# Patient Record
Sex: Female | Born: 1970 | Race: White | Hispanic: No | Smoking: Former smoker
Health system: Southern US, Community
[De-identification: ages and names within clinical notes are randomized; demographics above are authoritative.]

## PROBLEM LIST (undated history)

## (undated) DIAGNOSIS — Z5181 Encounter for therapeutic drug level monitoring: Secondary | ICD-10-CM

## (undated) DIAGNOSIS — E039 Hypothyroidism, unspecified: Secondary | ICD-10-CM

## (undated) DIAGNOSIS — M329 Systemic lupus erythematosus, unspecified: Secondary | ICD-10-CM

## (undated) DIAGNOSIS — E538 Deficiency of other specified B group vitamins: Secondary | ICD-10-CM

## (undated) DIAGNOSIS — G629 Polyneuropathy, unspecified: Secondary | ICD-10-CM

## (undated) DIAGNOSIS — K219 Gastro-esophageal reflux disease without esophagitis: Secondary | ICD-10-CM

## (undated) DIAGNOSIS — F419 Anxiety disorder, unspecified: Secondary | ICD-10-CM

## (undated) DIAGNOSIS — M545 Low back pain, unspecified: Secondary | ICD-10-CM

## (undated) DIAGNOSIS — R8789 Other abnormal findings in specimens from female genital organs: Principal | ICD-10-CM

## (undated) DIAGNOSIS — Z9289 Personal history of other medical treatment: Secondary | ICD-10-CM

## (undated) DIAGNOSIS — D591 Autoimmune hemolytic anemia, unspecified: Secondary | ICD-10-CM

## (undated) DIAGNOSIS — Z7901 Long term (current) use of anticoagulants: Secondary | ICD-10-CM

## (undated) DIAGNOSIS — Z8742 Personal history of other diseases of the female genital tract: Secondary | ICD-10-CM

## (undated) DIAGNOSIS — T50905A Adverse effect of unspecified drugs, medicaments and biological substances, initial encounter: Secondary | ICD-10-CM

## (undated) DIAGNOSIS — R009 Unspecified abnormalities of heart beat: Secondary | ICD-10-CM

## (undated) DIAGNOSIS — R87613 High grade squamous intraepithelial lesion on cytologic smear of cervix (HGSIL): Principal | ICD-10-CM

## (undated) DIAGNOSIS — M797 Fibromyalgia: Secondary | ICD-10-CM

## (undated) DIAGNOSIS — M069 Rheumatoid arthritis, unspecified: Secondary | ICD-10-CM

## (undated) DIAGNOSIS — K716 Toxic liver disease with hepatitis, not elsewhere classified: Secondary | ICD-10-CM

## (undated) DIAGNOSIS — I7300 Raynaud's syndrome without gangrene: Secondary | ICD-10-CM

## (undated) DIAGNOSIS — M7051 Other bursitis of knee, right knee: Secondary | ICD-10-CM

## (undated) DIAGNOSIS — Z1371 Encounter for nonprocreative screening for genetic disease carrier status: Secondary | ICD-10-CM

## (undated) DIAGNOSIS — I059 Rheumatic mitral valve disease, unspecified: Secondary | ICD-10-CM

## (undated) DIAGNOSIS — G8929 Other chronic pain: Secondary | ICD-10-CM

## (undated) DIAGNOSIS — C50912 Malignant neoplasm of unspecified site of left female breast: Secondary | ICD-10-CM

## (undated) DIAGNOSIS — J189 Pneumonia, unspecified organism: Secondary | ICD-10-CM

## (undated) HISTORY — DX: Other bursitis of knee, right knee: M70.51

## (undated) HISTORY — DX: Polyneuropathy, unspecified: G62.9

## (undated) HISTORY — DX: Systemic lupus erythematosus, unspecified: M32.9

## (undated) HISTORY — DX: Personal history of other diseases of the female genital tract: Z87.42

## (undated) HISTORY — DX: Long term (current) use of anticoagulants: Z79.01

## (undated) HISTORY — DX: Encounter for nonprocreative screening for genetic disease carrier status: Z13.71

## (undated) HISTORY — DX: Rheumatic mitral valve disease, unspecified: I05.9

## (undated) HISTORY — DX: Other abnormal findings in specimens from female genital organs: R87.89

## (undated) HISTORY — DX: Unspecified abnormalities of heart beat: R00.9

## (undated) HISTORY — DX: Autoimmune hemolytic anemia, unspecified: D59.10

## (undated) HISTORY — PX: CARDIAC VALVE REPLACEMENT: SHX585

## (undated) HISTORY — DX: High grade squamous intraepithelial lesion on cytologic smear of cervix (HGSIL): R87.613

## (undated) HISTORY — PX: LAPAROSCOPIC CHOLECYSTECTOMY: SUR755

## (undated) HISTORY — DX: Deficiency of other specified B group vitamins: E53.8

## (undated) HISTORY — DX: Pneumonia, unspecified organism: J18.9

## (undated) HISTORY — DX: Other autoimmune hemolytic anemias: D59.1

## (undated) HISTORY — PX: APPENDECTOMY: SHX54

## (undated) HISTORY — PX: CORNEAL TRANSPLANT: SHX108

## (undated) HISTORY — DX: Adverse effect of unspecified drugs, medicaments and biological substances, initial encounter: T50.905A

## (undated) HISTORY — DX: Toxic liver disease with hepatitis, not elsewhere classified: K71.6

## (undated) HISTORY — DX: Encounter for therapeutic drug level monitoring: Z51.81

## (undated) HISTORY — PX: TUBAL LIGATION: SHX77

## (undated) SURGERY — APPENDECTOMY, LAPAROSCOPIC
Anesthesia: General

---

## 2001-03-28 ENCOUNTER — Encounter: Payer: Self-pay | Admitting: Emergency Medicine

## 2001-03-28 ENCOUNTER — Emergency Department (HOSPITAL_COMMUNITY): Admission: EM | Admit: 2001-03-28 | Discharge: 2001-03-29 | Payer: Self-pay | Admitting: Emergency Medicine

## 2001-05-07 ENCOUNTER — Encounter: Payer: Self-pay | Admitting: Family Medicine

## 2001-05-07 ENCOUNTER — Ambulatory Visit (HOSPITAL_COMMUNITY): Admission: RE | Admit: 2001-05-07 | Discharge: 2001-05-07 | Payer: Self-pay | Admitting: Family Medicine

## 2001-05-09 ENCOUNTER — Ambulatory Visit (HOSPITAL_COMMUNITY): Admission: RE | Admit: 2001-05-09 | Discharge: 2001-05-09 | Payer: Self-pay | Admitting: Family Medicine

## 2001-05-09 ENCOUNTER — Encounter: Payer: Self-pay | Admitting: Family Medicine

## 2001-05-12 ENCOUNTER — Ambulatory Visit (HOSPITAL_COMMUNITY): Admission: RE | Admit: 2001-05-12 | Discharge: 2001-05-12 | Payer: Self-pay | Admitting: Family Medicine

## 2001-05-12 ENCOUNTER — Encounter: Payer: Self-pay | Admitting: Family Medicine

## 2001-05-13 ENCOUNTER — Emergency Department (HOSPITAL_COMMUNITY): Admission: EM | Admit: 2001-05-13 | Discharge: 2001-05-14 | Payer: Self-pay

## 2005-02-20 ENCOUNTER — Ambulatory Visit (HOSPITAL_COMMUNITY): Admission: RE | Admit: 2005-02-20 | Discharge: 2005-02-20 | Payer: Self-pay | Admitting: Family Medicine

## 2006-02-19 HISTORY — PX: MITRAL VALVE REPLACEMENT: SHX147

## 2006-02-19 HISTORY — PX: ENDOMETRIAL ABLATION: SHX621

## 2006-02-19 HISTORY — PX: CARDIAC CATHETERIZATION: SHX172

## 2006-03-20 ENCOUNTER — Ambulatory Visit: Payer: Self-pay | Admitting: Cardiology

## 2006-03-26 ENCOUNTER — Ambulatory Visit: Payer: Self-pay | Admitting: Cardiovascular Disease

## 2006-03-26 ENCOUNTER — Encounter: Payer: Self-pay | Admitting: Cardiology

## 2006-03-26 ENCOUNTER — Inpatient Hospital Stay (HOSPITAL_BASED_OUTPATIENT_CLINIC_OR_DEPARTMENT_OTHER): Admission: RE | Admit: 2006-03-26 | Discharge: 2006-03-26 | Payer: Self-pay | Admitting: Cardiovascular Disease

## 2006-03-28 ENCOUNTER — Ambulatory Visit: Payer: Self-pay | Admitting: Cardiology

## 2006-06-13 ENCOUNTER — Ambulatory Visit: Payer: Self-pay | Admitting: Cardiology

## 2006-09-11 ENCOUNTER — Ambulatory Visit: Payer: Self-pay | Admitting: Cardiology

## 2006-09-19 ENCOUNTER — Ambulatory Visit: Payer: Self-pay | Admitting: Cardiology

## 2007-05-13 ENCOUNTER — Ambulatory Visit: Payer: Self-pay | Admitting: Cardiology

## 2007-05-21 ENCOUNTER — Encounter: Payer: Self-pay | Admitting: Obstetrics & Gynecology

## 2007-05-21 ENCOUNTER — Ambulatory Visit (HOSPITAL_COMMUNITY): Admission: RE | Admit: 2007-05-21 | Discharge: 2007-05-21 | Payer: Self-pay | Admitting: Obstetrics & Gynecology

## 2007-06-05 ENCOUNTER — Encounter: Payer: Self-pay | Admitting: Cardiology

## 2007-06-07 ENCOUNTER — Emergency Department (HOSPITAL_COMMUNITY): Admission: EM | Admit: 2007-06-07 | Discharge: 2007-06-07 | Payer: Self-pay | Admitting: Emergency Medicine

## 2007-06-19 ENCOUNTER — Ambulatory Visit: Payer: Self-pay | Admitting: Cardiology

## 2007-06-25 ENCOUNTER — Ambulatory Visit: Payer: Self-pay | Admitting: Cardiology

## 2007-07-21 ENCOUNTER — Ambulatory Visit (HOSPITAL_COMMUNITY): Payer: Self-pay | Admitting: Oncology

## 2007-07-21 ENCOUNTER — Encounter (HOSPITAL_COMMUNITY): Admission: RE | Admit: 2007-07-21 | Discharge: 2007-08-20 | Payer: Self-pay | Admitting: Oncology

## 2007-10-03 ENCOUNTER — Encounter (HOSPITAL_COMMUNITY): Admission: RE | Admit: 2007-10-03 | Discharge: 2007-11-02 | Payer: Self-pay | Admitting: Oncology

## 2007-10-03 ENCOUNTER — Ambulatory Visit (HOSPITAL_COMMUNITY): Payer: Self-pay | Admitting: Oncology

## 2007-10-15 ENCOUNTER — Ambulatory Visit: Payer: Self-pay | Admitting: Cardiology

## 2007-11-04 ENCOUNTER — Encounter (HOSPITAL_COMMUNITY): Admission: RE | Admit: 2007-11-04 | Discharge: 2007-11-17 | Payer: Self-pay | Admitting: Oncology

## 2007-11-05 ENCOUNTER — Ambulatory Visit (HOSPITAL_COMMUNITY): Admission: RE | Admit: 2007-11-05 | Discharge: 2007-11-05 | Payer: Self-pay | Admitting: Family Medicine

## 2007-11-18 ENCOUNTER — Ambulatory Visit (HOSPITAL_COMMUNITY): Admission: RE | Admit: 2007-11-18 | Discharge: 2007-11-18 | Payer: Self-pay | Admitting: Oncology

## 2007-11-23 ENCOUNTER — Emergency Department (HOSPITAL_COMMUNITY): Admission: EM | Admit: 2007-11-23 | Discharge: 2007-11-23 | Payer: Self-pay | Admitting: Emergency Medicine

## 2007-11-28 ENCOUNTER — Encounter (HOSPITAL_COMMUNITY): Admission: RE | Admit: 2007-11-28 | Discharge: 2007-12-28 | Payer: Self-pay | Admitting: Oncology

## 2007-12-15 ENCOUNTER — Ambulatory Visit (HOSPITAL_COMMUNITY): Payer: Self-pay | Admitting: Oncology

## 2007-12-30 ENCOUNTER — Encounter (HOSPITAL_COMMUNITY): Admission: RE | Admit: 2007-12-30 | Discharge: 2008-01-29 | Payer: Self-pay | Admitting: Oncology

## 2008-02-03 ENCOUNTER — Encounter (HOSPITAL_COMMUNITY): Admission: RE | Admit: 2008-02-03 | Discharge: 2008-03-04 | Payer: Self-pay | Admitting: Oncology

## 2008-02-16 ENCOUNTER — Ambulatory Visit (HOSPITAL_COMMUNITY): Payer: Self-pay | Admitting: Oncology

## 2008-03-08 ENCOUNTER — Encounter (HOSPITAL_COMMUNITY): Admission: RE | Admit: 2008-03-08 | Discharge: 2008-04-07 | Payer: Self-pay | Admitting: Oncology

## 2008-04-19 ENCOUNTER — Ambulatory Visit (HOSPITAL_COMMUNITY): Payer: Self-pay | Admitting: Oncology

## 2008-04-19 ENCOUNTER — Encounter (HOSPITAL_COMMUNITY): Admission: RE | Admit: 2008-04-19 | Discharge: 2008-05-19 | Payer: Self-pay | Admitting: Oncology

## 2008-05-31 ENCOUNTER — Encounter: Payer: Self-pay | Admitting: Cardiology

## 2008-05-31 ENCOUNTER — Encounter (HOSPITAL_COMMUNITY): Admission: RE | Admit: 2008-05-31 | Discharge: 2008-06-30 | Payer: Self-pay | Admitting: Oncology

## 2008-06-07 ENCOUNTER — Ambulatory Visit: Payer: Self-pay | Admitting: Cardiology

## 2008-07-12 ENCOUNTER — Ambulatory Visit (HOSPITAL_COMMUNITY): Payer: Self-pay | Admitting: Oncology

## 2008-07-12 ENCOUNTER — Encounter (HOSPITAL_COMMUNITY): Admission: RE | Admit: 2008-07-12 | Discharge: 2008-08-11 | Payer: Self-pay | Admitting: Oncology

## 2008-10-05 ENCOUNTER — Ambulatory Visit (HOSPITAL_COMMUNITY): Payer: Self-pay | Admitting: Oncology

## 2008-10-05 ENCOUNTER — Encounter (HOSPITAL_COMMUNITY): Admission: RE | Admit: 2008-10-05 | Discharge: 2008-11-04 | Payer: Self-pay | Admitting: Oncology

## 2008-10-31 ENCOUNTER — Emergency Department (HOSPITAL_COMMUNITY): Admission: EM | Admit: 2008-10-31 | Discharge: 2008-10-31 | Payer: Self-pay | Admitting: Emergency Medicine

## 2008-12-28 ENCOUNTER — Ambulatory Visit (HOSPITAL_COMMUNITY): Payer: Self-pay | Admitting: Oncology

## 2008-12-28 ENCOUNTER — Encounter (HOSPITAL_COMMUNITY): Admission: RE | Admit: 2008-12-28 | Discharge: 2009-01-27 | Payer: Self-pay | Admitting: Oncology

## 2009-02-19 DIAGNOSIS — C50912 Malignant neoplasm of unspecified site of left female breast: Secondary | ICD-10-CM

## 2009-02-19 HISTORY — DX: Malignant neoplasm of unspecified site of left female breast: C50.912

## 2009-05-02 ENCOUNTER — Ambulatory Visit (HOSPITAL_COMMUNITY): Payer: Self-pay | Admitting: Oncology

## 2009-05-02 ENCOUNTER — Encounter (HOSPITAL_COMMUNITY): Admission: RE | Admit: 2009-05-02 | Discharge: 2009-06-01 | Payer: Self-pay | Admitting: Oncology

## 2009-09-02 ENCOUNTER — Ambulatory Visit (HOSPITAL_COMMUNITY): Payer: Self-pay | Admitting: Oncology

## 2009-09-02 ENCOUNTER — Encounter (HOSPITAL_COMMUNITY): Admission: RE | Admit: 2009-09-02 | Discharge: 2009-10-02 | Payer: Self-pay | Admitting: Oncology

## 2009-11-10 ENCOUNTER — Ambulatory Visit: Payer: Self-pay | Admitting: Cardiology

## 2009-11-10 DIAGNOSIS — M329 Systemic lupus erythematosus, unspecified: Secondary | ICD-10-CM | POA: Insufficient documentation

## 2009-11-10 DIAGNOSIS — Z952 Presence of prosthetic heart valve: Secondary | ICD-10-CM | POA: Insufficient documentation

## 2009-12-02 ENCOUNTER — Encounter (HOSPITAL_COMMUNITY)
Admission: RE | Admit: 2009-12-02 | Discharge: 2010-01-01 | Payer: Self-pay | Source: Home / Self Care | Admitting: Oncology

## 2009-12-02 ENCOUNTER — Ambulatory Visit (HOSPITAL_COMMUNITY): Payer: Self-pay | Admitting: Oncology

## 2009-12-28 ENCOUNTER — Ambulatory Visit (HOSPITAL_COMMUNITY)
Admission: RE | Admit: 2009-12-28 | Discharge: 2009-12-28 | Payer: Self-pay | Source: Home / Self Care | Admitting: Oncology

## 2009-12-30 ENCOUNTER — Telehealth (INDEPENDENT_AMBULATORY_CARE_PROVIDER_SITE_OTHER): Payer: Self-pay | Admitting: *Deleted

## 2010-01-04 ENCOUNTER — Encounter (HOSPITAL_COMMUNITY)
Admission: RE | Admit: 2010-01-04 | Discharge: 2010-02-03 | Payer: Self-pay | Source: Home / Self Care | Attending: Oncology | Admitting: Oncology

## 2010-01-06 ENCOUNTER — Encounter: Admission: RE | Admit: 2010-01-06 | Discharge: 2010-01-06 | Payer: Self-pay | Admitting: Oncology

## 2010-01-06 ENCOUNTER — Ambulatory Visit: Payer: Self-pay | Admitting: Genetic Counselor

## 2010-01-17 ENCOUNTER — Telehealth (INDEPENDENT_AMBULATORY_CARE_PROVIDER_SITE_OTHER): Payer: Self-pay | Admitting: *Deleted

## 2010-01-23 ENCOUNTER — Ambulatory Visit: Payer: Self-pay | Admitting: Cardiovascular Disease

## 2010-01-23 ENCOUNTER — Encounter (HOSPITAL_COMMUNITY): Payer: Self-pay | Admitting: Oncology

## 2010-01-23 ENCOUNTER — Encounter (INDEPENDENT_AMBULATORY_CARE_PROVIDER_SITE_OTHER): Payer: Self-pay | Admitting: *Deleted

## 2010-01-24 ENCOUNTER — Ambulatory Visit: Payer: Self-pay | Admitting: Cardiology

## 2010-01-24 LAB — CONVERTED CEMR LAB: POC INR: 2.9

## 2010-01-25 ENCOUNTER — Other Ambulatory Visit
Admission: RE | Admit: 2010-01-25 | Discharge: 2010-01-25 | Payer: Self-pay | Source: Home / Self Care | Admitting: Obstetrics and Gynecology

## 2010-01-26 ENCOUNTER — Ambulatory Visit (HOSPITAL_COMMUNITY): Payer: Self-pay | Admitting: Oncology

## 2010-01-30 ENCOUNTER — Ambulatory Visit (HOSPITAL_COMMUNITY)
Admission: RE | Admit: 2010-01-30 | Discharge: 2010-01-30 | Payer: Self-pay | Source: Home / Self Care | Attending: Surgery | Admitting: Surgery

## 2010-01-31 ENCOUNTER — Ambulatory Visit (HOSPITAL_COMMUNITY): Payer: Self-pay | Admitting: Oncology

## 2010-02-03 ENCOUNTER — Ambulatory Visit: Payer: Self-pay | Admitting: Cardiology

## 2010-02-03 ENCOUNTER — Encounter: Payer: Self-pay | Admitting: Cardiology

## 2010-02-07 ENCOUNTER — Ambulatory Visit: Payer: Self-pay | Admitting: Cardiology

## 2010-02-07 ENCOUNTER — Inpatient Hospital Stay (HOSPITAL_COMMUNITY)
Admission: EM | Admit: 2010-02-07 | Discharge: 2010-02-21 | Disposition: A | Discharge status: Other Acute Inpatient Hospital | Payer: Self-pay | Source: Home / Self Care

## 2010-02-10 ENCOUNTER — Encounter (INDEPENDENT_AMBULATORY_CARE_PROVIDER_SITE_OTHER): Payer: Self-pay | Admitting: Internal Medicine

## 2010-02-10 ENCOUNTER — Encounter: Payer: Self-pay | Admitting: Physician Assistant

## 2010-02-10 ENCOUNTER — Encounter: Payer: Self-pay | Admitting: Cardiology

## 2010-02-11 ENCOUNTER — Encounter (INDEPENDENT_AMBULATORY_CARE_PROVIDER_SITE_OTHER): Payer: Self-pay | Admitting: *Deleted

## 2010-02-11 ENCOUNTER — Inpatient Hospital Stay (HOSPITAL_COMMUNITY)
Admission: EM | Admit: 2010-02-11 | Discharge: 2010-02-21 | Payer: Self-pay | Attending: Internal Medicine | Admitting: Internal Medicine

## 2010-02-15 ENCOUNTER — Ambulatory Visit: Payer: Self-pay

## 2010-02-15 ENCOUNTER — Encounter (INDEPENDENT_AMBULATORY_CARE_PROVIDER_SITE_OTHER): Payer: Self-pay | Admitting: *Deleted

## 2010-02-17 ENCOUNTER — Encounter (INDEPENDENT_AMBULATORY_CARE_PROVIDER_SITE_OTHER): Payer: Self-pay | Admitting: Internal Medicine

## 2010-02-17 ENCOUNTER — Encounter (INDEPENDENT_AMBULATORY_CARE_PROVIDER_SITE_OTHER): Payer: Self-pay | Admitting: *Deleted

## 2010-02-19 HISTORY — PX: BREAST BIOPSY: SHX20

## 2010-02-19 HISTORY — PX: MASTECTOMY: SHX3

## 2010-02-20 ENCOUNTER — Encounter: Payer: Self-pay | Admitting: Physician Assistant

## 2010-02-21 ENCOUNTER — Encounter (INDEPENDENT_AMBULATORY_CARE_PROVIDER_SITE_OTHER): Payer: Self-pay | Admitting: *Deleted

## 2010-02-21 ENCOUNTER — Encounter: Payer: Self-pay | Admitting: Physician Assistant

## 2010-02-22 ENCOUNTER — Encounter (HOSPITAL_COMMUNITY)
Admission: RE | Admit: 2010-02-22 | Discharge: 2010-03-21 | Payer: Self-pay | Source: Home / Self Care | Attending: Oncology | Admitting: Oncology

## 2010-02-22 ENCOUNTER — Telehealth (INDEPENDENT_AMBULATORY_CARE_PROVIDER_SITE_OTHER): Payer: Self-pay | Admitting: *Deleted

## 2010-02-24 ENCOUNTER — Ambulatory Visit
Admission: RE | Admit: 2010-02-24 | Discharge: 2010-02-24 | Payer: Self-pay | Source: Home / Self Care | Attending: Physician Assistant | Admitting: Physician Assistant

## 2010-02-24 ENCOUNTER — Encounter: Payer: Self-pay | Admitting: Physician Assistant

## 2010-02-24 ENCOUNTER — Ambulatory Visit: Admission: RE | Admit: 2010-02-24 | Discharge: 2010-02-24 | Payer: Self-pay | Source: Home / Self Care

## 2010-02-24 DIAGNOSIS — Z9089 Acquired absence of other organs: Secondary | ICD-10-CM | POA: Insufficient documentation

## 2010-02-24 DIAGNOSIS — R5081 Fever presenting with conditions classified elsewhere: Secondary | ICD-10-CM | POA: Insufficient documentation

## 2010-02-24 DIAGNOSIS — C50419 Malignant neoplasm of upper-outer quadrant of unspecified female breast: Secondary | ICD-10-CM | POA: Insufficient documentation

## 2010-03-06 LAB — COMPREHENSIVE METABOLIC PANEL
ALT: 13 U/L (ref 0–35)
AST: 19 U/L (ref 0–37)
Albumin: 3.2 g/dL — ABNORMAL LOW (ref 3.5–5.2)
Alkaline Phosphatase: 60 U/L (ref 39–117)
BUN: 11 mg/dL (ref 6–23)
CO2: 25 mEq/L (ref 19–32)
Calcium: 8.8 mg/dL (ref 8.4–10.5)
Chloride: 106 mEq/L (ref 96–112)
Creatinine, Ser: 0.7 mg/dL (ref 0.4–1.2)
GFR calc Af Amer: >60 mL/min (ref >60)
GFR calc non Af Amer: >60 mL/min (ref >60)
Glucose, Bld: 109 mg/dL — ABNORMAL HIGH (ref 70–99)
Potassium: 3.2 mEq/L — ABNORMAL LOW (ref 3.5–5.1)
Sodium: 139 mEq/L (ref 135–145)
Total Bilirubin: 0.4 mg/dL (ref 0.3–1.2)
Total Protein: 6.7 g/dL (ref 6.0–8.3)

## 2010-03-06 LAB — DIFFERENTIAL
Basophils Absolute: 0.1 10*3/uL (ref 0.0–0.1)
Basophils Relative: 1 % (ref 0–1)
Eosinophils Absolute: 0.5 10*3/uL (ref 0.0–0.7)
Eosinophils Relative: 9 % — ABNORMAL HIGH (ref 0–5)
Lymphocytes Relative: 6 % — ABNORMAL LOW (ref 12–46)
Lymphs Abs: 0.4 10*3/uL — ABNORMAL LOW (ref 0.7–4.0)
Monocytes Absolute: 0.3 10*3/uL (ref 0.1–1.0)
Monocytes Relative: 6 % (ref 3–12)
Neutro Abs: 4.4 10*3/uL (ref 1.7–7.7)
Neutrophils Relative %: 78 % — ABNORMAL HIGH (ref 43–77)

## 2010-03-06 LAB — CBC
HCT: 28 % — ABNORMAL LOW (ref 36.0–46.0)
Hemoglobin: 9.3 g/dL — ABNORMAL LOW (ref 12.0–15.0)
MCH: 31.4 pg (ref 26.0–34.0)
MCHC: 33.2 g/dL (ref 30.0–36.0)
MCV: 94.6 fL (ref 78.0–100.0)
Platelets: 231 10*3/uL (ref 150–400)
RBC: 2.96 MIL/uL — ABNORMAL LOW (ref 3.87–5.11)
RDW: 16.2 % — ABNORMAL HIGH (ref 11.5–15.5)
WBC: 5.6 10*3/uL (ref 4.0–10.5)

## 2010-03-07 ENCOUNTER — Ambulatory Visit
Admission: RE | Admit: 2010-03-07 | Discharge: 2010-03-07 | Payer: Self-pay | Source: Home / Self Care | Attending: Cardiology | Admitting: Cardiology

## 2010-03-07 LAB — CONVERTED CEMR LAB: POC INR: 1.1

## 2010-03-10 ENCOUNTER — Inpatient Hospital Stay (HOSPITAL_COMMUNITY)
Admission: AD | Admit: 2010-03-10 | Discharge: 2010-04-01 | DRG: 314 | Disposition: A | Discharge status: Home Health | Payer: PRIVATE HEALTH INSURANCE | Attending: Internal Medicine | Admitting: Internal Medicine

## 2010-03-10 ENCOUNTER — Encounter: Payer: Self-pay | Admitting: Cardiology

## 2010-03-10 DIAGNOSIS — Y849 Medical procedure, unspecified as the cause of abnormal reaction of the patient, or of later complication, without mention of misadventure at the time of the procedure: Secondary | ICD-10-CM | POA: Diagnosis present

## 2010-03-10 DIAGNOSIS — A419 Sepsis, unspecified organism: Secondary | ICD-10-CM

## 2010-03-10 DIAGNOSIS — R112 Nausea with vomiting, unspecified: Secondary | ICD-10-CM | POA: Diagnosis present

## 2010-03-10 DIAGNOSIS — B373 Candidiasis of vulva and vagina: Secondary | ICD-10-CM | POA: Diagnosis not present

## 2010-03-10 DIAGNOSIS — Z954 Presence of other heart-valve replacement: Secondary | ICD-10-CM

## 2010-03-10 DIAGNOSIS — T80212A Local infection due to central venous catheter, initial encounter: Principal | ICD-10-CM | POA: Diagnosis present

## 2010-03-10 DIAGNOSIS — R791 Abnormal coagulation profile: Secondary | ICD-10-CM | POA: Diagnosis present

## 2010-03-10 DIAGNOSIS — T451X5A Adverse effect of antineoplastic and immunosuppressive drugs, initial encounter: Secondary | ICD-10-CM | POA: Diagnosis present

## 2010-03-10 DIAGNOSIS — E872 Acidosis, unspecified: Secondary | ICD-10-CM | POA: Diagnosis present

## 2010-03-10 DIAGNOSIS — D61810 Antineoplastic chemotherapy induced pancytopenia: Secondary | ICD-10-CM | POA: Diagnosis present

## 2010-03-10 DIAGNOSIS — T45515A Adverse effect of anticoagulants, initial encounter: Secondary | ICD-10-CM | POA: Diagnosis present

## 2010-03-10 DIAGNOSIS — E039 Hypothyroidism, unspecified: Secondary | ICD-10-CM | POA: Diagnosis present

## 2010-03-10 DIAGNOSIS — C50919 Malignant neoplasm of unspecified site of unspecified female breast: Secondary | ICD-10-CM | POA: Diagnosis present

## 2010-03-10 DIAGNOSIS — M329 Systemic lupus erythematosus, unspecified: Secondary | ICD-10-CM | POA: Diagnosis present

## 2010-03-10 DIAGNOSIS — E871 Hypo-osmolality and hyponatremia: Secondary | ICD-10-CM | POA: Diagnosis not present

## 2010-03-10 DIAGNOSIS — D599 Acquired hemolytic anemia, unspecified: Secondary | ICD-10-CM | POA: Diagnosis present

## 2010-03-10 DIAGNOSIS — R609 Edema, unspecified: Secondary | ICD-10-CM | POA: Diagnosis present

## 2010-03-10 DIAGNOSIS — R0902 Hypoxemia: Secondary | ICD-10-CM | POA: Diagnosis present

## 2010-03-10 DIAGNOSIS — A0472 Enterocolitis due to Clostridium difficile, not specified as recurrent: Secondary | ICD-10-CM | POA: Diagnosis present

## 2010-03-10 DIAGNOSIS — B3731 Acute candidiasis of vulva and vagina: Secondary | ICD-10-CM | POA: Diagnosis not present

## 2010-03-10 DIAGNOSIS — E876 Hypokalemia: Secondary | ICD-10-CM | POA: Diagnosis present

## 2010-03-13 ENCOUNTER — Ambulatory Visit: Admit: 2010-03-13 | Payer: Self-pay

## 2010-03-13 ENCOUNTER — Encounter (INDEPENDENT_AMBULATORY_CARE_PROVIDER_SITE_OTHER): Payer: Self-pay | Admitting: *Deleted

## 2010-03-13 LAB — LACTIC ACID, PLASMA: Lactic Acid, Venous: 1.2 mmol/L (ref 0.5–2.2)

## 2010-03-13 LAB — CBC
HCT: 26.5 % — ABNORMAL LOW (ref 36.0–46.0)
Hemoglobin: 10.3 g/dL — ABNORMAL LOW (ref 12.0–15.0)
MCH: 32.3 pg (ref 26.0–34.0)
MCHC: 35.8 g/dL (ref 30.0–36.0)
MCV: 90.5 fL (ref 78.0–100.0)
MCV: 90.1 fL (ref 78.0–100.0)
Platelets: 96 10*3/uL — ABNORMAL LOW (ref 150–400)
Platelets: 82 10*3/uL — ABNORMAL LOW (ref 150–400)
RBC: 3.28 MIL/uL — ABNORMAL LOW (ref 3.87–5.11)
RDW: 14.5 % (ref 11.5–15.5)
WBC: 0.5 10*3/uL — CL (ref 4.0–10.5)
WBC: 0.4 10*3/uL — CL (ref 4.0–10.5)

## 2010-03-13 LAB — RETICULOCYTES: Retic Ct Pct: 0.1 % — ABNORMAL LOW (ref 0.4–3.1)

## 2010-03-13 LAB — TSH: TSH: 2.798 u[IU]/mL (ref 0.350–4.500)

## 2010-03-13 LAB — URINALYSIS, ROUTINE W REFLEX MICROSCOPIC
Bilirubin Urine: NEGATIVE
Nitrite: NEGATIVE
pH: 5 (ref 5.0–8.0)

## 2010-03-13 LAB — DIFFERENTIAL
Lymphocytes Relative: 29 % (ref 12–46)
Lymphs Abs: 0.1 10*3/uL — ABNORMAL LOW (ref 0.7–4.0)
Monocytes Relative: 0 % — ABNORMAL LOW (ref 3–12)
Neutro Abs: 0.1 10*3/uL — ABNORMAL LOW (ref 1.7–7.7)
Neutrophils Relative %: 23 % — ABNORMAL LOW (ref 43–77)

## 2010-03-13 LAB — COMPREHENSIVE METABOLIC PANEL
Albumin: 3.7 g/dL (ref 3.5–5.2)
Alkaline Phosphatase: 79 U/L (ref 39–117)
Creatinine, Ser: 0.6 mg/dL (ref 0.4–1.2)
Glucose, Bld: 81 mg/dL (ref 70–99)
Potassium: 3.7 mEq/L (ref 3.5–5.1)
Sodium: 136 mEq/L (ref 135–145)
Total Bilirubin: 0.6 mg/dL (ref 0.3–1.2)

## 2010-03-13 LAB — PROTIME-INR
INR: 1.3 (ref 0.00–1.49)
Prothrombin Time: 16.4 seconds — ABNORMAL HIGH (ref 11.6–15.2)

## 2010-03-13 LAB — PHOSPHORUS: Phosphorus: 3.2 mg/dL (ref 2.3–4.6)

## 2010-03-13 LAB — MAGNESIUM: Magnesium: 1.8 mg/dL (ref 1.5–2.5)

## 2010-03-13 LAB — HEPARIN LEVEL (UNFRACTIONATED): Heparin Unfractionated: 0.18 IU/mL — ABNORMAL LOW (ref 0.30–0.70)

## 2010-03-13 LAB — URINE MICROSCOPIC-ADD ON

## 2010-03-13 LAB — VITAMIN B12: Vitamin B-12: >2000 pg/mL — ABNORMAL HIGH (ref 211–911)

## 2010-03-13 LAB — PROCALCITONIN: Procalcitonin: <0.1 ng/mL

## 2010-03-13 LAB — HAPTOGLOBIN: Haptoglobin: <6 mg/dL — ABNORMAL LOW (ref 16–200)

## 2010-03-14 LAB — COMPREHENSIVE METABOLIC PANEL
ALT: 26 U/L (ref 0–35)
Alkaline Phosphatase: 77 U/L (ref 39–117)
BUN: 11 mg/dL (ref 6–23)
CO2: 20 mEq/L (ref 19–32)
Chloride: 107 mEq/L (ref 96–112)
GFR calc non Af Amer: >60 mL/min (ref >60)
Glucose, Bld: 108 mg/dL — ABNORMAL HIGH (ref 70–99)
Potassium: 3.5 mEq/L (ref 3.5–5.1)
Sodium: 135 mEq/L (ref 135–145)
Total Bilirubin: 1.2 mg/dL (ref 0.3–1.2)

## 2010-03-14 LAB — URINE CULTURE
Colony Count: NO GROWTH
Culture: NO GROWTH

## 2010-03-14 LAB — DIFFERENTIAL
Basophils Absolute: 0.1 10*3/uL (ref 0.0–0.1)
Basophils Absolute: 0.1 10*3/uL (ref 0.0–0.1)
Basophils Relative: 4 % — ABNORMAL HIGH (ref 0–1)
Basophils Relative: 4 % — ABNORMAL HIGH (ref 0–1)
Basophils Relative: 2 % — ABNORMAL HIGH (ref 0–1)
Eosinophils Absolute: 0.2 10*3/uL (ref 0.0–0.7)
Eosinophils Absolute: 0.1 10*3/uL (ref 0.0–0.7)
Eosinophils Relative: 13 % — ABNORMAL HIGH (ref 0–5)
Lymphocytes Relative: 20 % (ref 12–46)
Lymphocytes Relative: 7 % — ABNORMAL LOW (ref 12–46)
Lymphs Abs: 0.1 10*3/uL — ABNORMAL LOW (ref 0.7–4.0)
Monocytes Absolute: 0.1 10*3/uL (ref 0.1–1.0)
Monocytes Relative: 8 % (ref 3–12)
Monocytes Relative: 7 % (ref 3–12)
Neutro Abs: 0.9 10*3/uL — ABNORMAL LOW (ref 1.7–7.7)
Neutro Abs: 3.8 10*3/uL (ref 1.7–7.7)
Neutrophils Relative %: 36 % — ABNORMAL LOW (ref 43–77)
Neutrophils Relative %: 69 % (ref 43–77)
Neutrophils Relative %: 85 % — ABNORMAL HIGH (ref 43–77)
WBC Morphology: INCREASED

## 2010-03-14 LAB — VANCOMYCIN, TROUGH: Vancomycin Tr: 12.4 ug/mL (ref 10.0–20.0)

## 2010-03-14 LAB — PROTIME-INR
INR: 1.97 — ABNORMAL HIGH (ref 0.00–1.49)
Prothrombin Time: 20.1 seconds — ABNORMAL HIGH (ref 11.6–15.2)
Prothrombin Time: 22.6 seconds — ABNORMAL HIGH (ref 11.6–15.2)

## 2010-03-14 LAB — CBC
HCT: 22.4 % — ABNORMAL LOW (ref 36.0–46.0)
HCT: 21.9 % — ABNORMAL LOW (ref 36.0–46.0)
HCT: 21.1 % — ABNORMAL LOW (ref 36.0–46.0)
Hemoglobin: 7.9 g/dL — ABNORMAL LOW (ref 12.0–15.0)
Hemoglobin: 7.8 g/dL — ABNORMAL LOW (ref 12.0–15.0)
MCH: 31.5 pg (ref 26.0–34.0)
MCH: 31.6 pg (ref 26.0–34.0)
MCHC: 35.3 g/dL (ref 30.0–36.0)
MCHC: 35.6 g/dL (ref 30.0–36.0)
MCHC: 35.1 g/dL (ref 30.0–36.0)
MCV: 89.2 fL (ref 78.0–100.0)
MCV: 88.7 fL (ref 78.0–100.0)
Platelets: 72 10*3/uL — ABNORMAL LOW (ref 150–400)
Platelets: 67 10*3/uL — ABNORMAL LOW (ref 150–400)
RBC: 2.51 MIL/uL — ABNORMAL LOW (ref 3.87–5.11)
RBC: 2.47 MIL/uL — ABNORMAL LOW (ref 3.87–5.11)
RDW: 15 % (ref 11.5–15.5)
RDW: 16 % — ABNORMAL HIGH (ref 11.5–15.5)
WBC: 1.3 10*3/uL — CL (ref 4.0–10.5)

## 2010-03-14 LAB — OVA AND PARASITE EXAMINATION

## 2010-03-14 LAB — MAGNESIUM: Magnesium: 1.3 mg/dL — ABNORMAL LOW (ref 1.5–2.5)

## 2010-03-14 LAB — BASIC METABOLIC PANEL
BUN: 6 mg/dL (ref 6–23)
BUN: 2 mg/dL — ABNORMAL LOW (ref 6–23)
CO2: 21 mEq/L (ref 19–32)
Calcium: 8.2 mg/dL — ABNORMAL LOW (ref 8.4–10.5)
Calcium: 7.9 mg/dL — ABNORMAL LOW (ref 8.4–10.5)
Chloride: 106 mEq/L (ref 96–112)
Creatinine, Ser: 0.93 mg/dL (ref 0.4–1.2)
Creatinine, Ser: 0.84 mg/dL (ref 0.4–1.2)
GFR calc Af Amer: >60 mL/min (ref >60)
GFR calc Af Amer: >60 mL/min (ref >60)
GFR calc non Af Amer: >60 mL/min (ref >60)
GFR calc non Af Amer: >60 mL/min (ref >60)
Glucose, Bld: 88 mg/dL (ref 70–99)
Potassium: 3.1 mEq/L — ABNORMAL LOW (ref 3.5–5.1)
Sodium: 135 mEq/L (ref 135–145)

## 2010-03-14 LAB — TYPE AND SCREEN: Antibody Screen: NEGATIVE

## 2010-03-14 LAB — LIPID PANEL
LDL Cholesterol: 53 mg/dL (ref 0–99)
Total CHOL/HDL Ratio: 2.4 RATIO
VLDL: 15 mg/dL (ref 0–40)

## 2010-03-14 LAB — PHOSPHORUS: Phosphorus: 2.7 mg/dL (ref 2.3–4.6)

## 2010-03-15 LAB — BASIC METABOLIC PANEL
BUN: 2 mg/dL — ABNORMAL LOW (ref 6–23)
CO2: 22 mEq/L (ref 19–32)
CO2: 24 mEq/L (ref 19–32)
Chloride: 112 mEq/L (ref 96–112)
Creatinine, Ser: 0.82 mg/dL (ref 0.4–1.2)
GFR calc non Af Amer: >60 mL/min (ref >60)
Glucose, Bld: 77 mg/dL (ref 70–99)
Potassium: 3.7 mEq/L (ref 3.5–5.1)
Sodium: 138 mEq/L (ref 135–145)

## 2010-03-15 LAB — PROTIME-INR
INR: 3.33 — ABNORMAL HIGH (ref 0.00–1.49)
Prothrombin Time: 40.9 seconds — ABNORMAL HIGH (ref 11.6–15.2)
Prothrombin Time: 33.8 seconds — ABNORMAL HIGH (ref 11.6–15.2)

## 2010-03-15 LAB — CULTURE, BLOOD (ROUTINE X 2)
Culture: NO GROWTH
Culture: NO GROWTH

## 2010-03-15 LAB — CBC
Hemoglobin: 7 g/dL — ABNORMAL LOW (ref 12.0–15.0)
Hemoglobin: 7.6 g/dL — ABNORMAL LOW (ref 12.0–15.0)
MCH: 30.8 pg (ref 26.0–34.0)
MCHC: 33.8 g/dL (ref 30.0–36.0)
MCV: 91.2 fL (ref 78.0–100.0)
Platelets: 97 10*3/uL — ABNORMAL LOW (ref 150–400)
RBC: 2.27 MIL/uL — ABNORMAL LOW (ref 3.87–5.11)
RDW: 16.9 % — ABNORMAL HIGH (ref 11.5–15.5)

## 2010-03-15 LAB — CATH TIP CULTURE

## 2010-03-15 LAB — DIFFERENTIAL
Basophils Absolute: 0.1 10*3/uL (ref 0.0–0.1)
Eosinophils Absolute: 0 10*3/uL (ref 0.0–0.7)
Eosinophils Absolute: 0.1 10*3/uL (ref 0.0–0.7)
Eosinophils Relative: 0 % (ref 0–5)
Lymphs Abs: 0.4 10*3/uL — ABNORMAL LOW (ref 0.7–4.0)
Lymphs Abs: 0.2 10*3/uL — ABNORMAL LOW (ref 0.7–4.0)
Monocytes Relative: 7 % (ref 3–12)
Neutrophils Relative %: 93 % — ABNORMAL HIGH (ref 43–77)
WBC Morphology: INCREASED

## 2010-03-15 LAB — STOOL CULTURE

## 2010-03-16 LAB — URINALYSIS, ROUTINE W REFLEX MICROSCOPIC
Ketones, ur: NEGATIVE mg/dL
Nitrite: NEGATIVE
Specific Gravity, Urine: 1.02 (ref 1.005–1.030)
Urobilinogen, UA: 0.2 mg/dL (ref 0.0–1.0)
pH: 6 (ref 5.0–8.0)

## 2010-03-16 LAB — DIFFERENTIAL
Basophils Absolute: 0.2 10*3/uL — ABNORMAL HIGH (ref 0.0–0.1)
Basophils Relative: 2 % — ABNORMAL HIGH (ref 0–1)
Eosinophils Absolute: 0 10*3/uL (ref 0.0–0.7)
Eosinophils Relative: 0 % (ref 0–5)
Lymphocytes Relative: 2 % — ABNORMAL LOW (ref 12–46)

## 2010-03-16 LAB — PROTIME-INR
INR: 2.59 — ABNORMAL HIGH (ref 0.00–1.49)
Prothrombin Time: 27.9 seconds — ABNORMAL HIGH (ref 11.6–15.2)

## 2010-03-16 LAB — CBC
MCH: 31.7 pg (ref 26.0–34.0)
MCHC: 33.6 g/dL (ref 30.0–36.0)
MCV: 94.4 fL (ref 78.0–100.0)
Platelets: 98 10*3/uL — ABNORMAL LOW (ref 150–400)
RDW: 17.8 % — ABNORMAL HIGH (ref 11.5–15.5)

## 2010-03-16 NOTE — H&P (Addendum)
[name], [name]               ACCOUNT NO.:  [id number]  MEDICAL RECORD NO.:  [id number]          PATIENT TYPE:  INP  LOCATION:  IC09                          FACILITY:  [facility]  PHYSICIAN:  Rock Nephew, MD       DATE OF BIRTH:  08-02-1970  DATE OF ADMISSION:  03/10/2010 DATE OF DISCHARGE:  LH                             HISTORY & PHYSICAL   PRIMARY CARE PHYSICIAN:  Corrie Mckusick, M.D.  CHIEF COMPLAINT:  Infected right chest Port-A-Cath with neutropenia.  HISTORY OF PRESENT ILLNESS:  This is a 40 year old female with a history of presumed stage II left breast cancer, has received two treatments of chemotherapy.  The patient's last chemotherapy treatment was last Friday, March 03, 2010. The patient was recently in the hospital for sepsis in which the patient underwent a cholecystectomy.  The patient's blood cultures were negative at that time.  The patient presented for a checkup at the [facility]  The nurses tried to access the patient's Port-A-Cath.  The Port-A-Cath had pus plus brownish material come out of it.  They squeezed out as much brownish material and pus as they could. Otherwise the patient reports tenderness at the site of the Port-A-Cath. She denies any fevers or any chills.  She denies coughing.  She denies any burning on urination.  She does report that she has diarrhea since she started chemotherapy.  The patient is transferred to [facility] for further care.  I did personally speak with Dr. Mariel Sleet and he felt that the Port-A-Cath needed to be taken out.  Of note the patient also has had two fluctuant bumps, one on left side of her belly button she reports 2 x 2 cm and one on the right side of her belly button about 2 x 2 cm.  ALLERGIES:  No known drug allergies per patient.  PAST MEDICAL HISTORY: 1. Status post cholecystectomy. 2. Hypothyroidism. 3. History of breast cancer left breast status post chemotherapy     awaiting  lumpectomy. 4. History of neutropenia and pancytopenia. 5. History of hemolytic anemia. 6. History of lupus, SLE. 7. 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 8. Former tobacco smoker. 9. History of Raynaud's disease. 10.History of diastolic dysfunction.  FAMILY HISTORY:  Hypertension.  SOCIAL HISTORY:  No ethanol.  No tobacco.  No illicit drugs.  Former tobacco smoker.  The patient is currently on disability.  REVIEW OF SYSTEMS:  The patient reports slight headache.  Denies any chest pain, any shortness of breath.  She reports some nausea, but no vomiting.  She reports pain at the site of the Port-A-Cath.  She denies any abdominal pain.  She reports she has abdominal bumps.  She denies any burning on urination.  She reports diarrhea.  She denies any pain in her legs.  Denies any seizures or stroke-like activity.  PHYSICAL EXAMINATION:  GENERAL APPEARANCE:  She is alert, awake and oriented x3. VITAL SIGNS:  She is currently afebrile, heart rate is 83, respiratory rate is 12, blood pressure is 115/75, she is 100% saturation on room air. HEAD, EYES, EARS, NOSE AND THROAT:  Normocephalic, atraumatic.  Pupils equally round and reactive to light. CARDIOVASCULAR:  S1, S2.  There is a mitral valve click. CHEST:  Port-A-Cath appears slightly and erythematous.  I cannot appreciate any pus or drainage. LUNGS:  Clear to auscultation bilaterally. ABDOMEN:  Slightly protuberant, soft, nontender, nondistended.  Bowel sounds positive.  She has bumps, one on the left middle quadrant which is about 2 x 2 cm, mildly fluctuant.  She also another bump in the left lower quadrant which is about same size 2 x 2 cm which is slightly fluctuant.  She also had a bump in the right lower quadrant of her belly which is again 2 x 2 cm.  They appear red and fluctuant. EXTREMITIES:  She has no lower extremity edema. NEUROLOGIC:  She is alert, awake, oriented x3.  LABORATORY DATA:   Currently I do not have any radiological studies for the patient.  WBC count 0.5, hemoglobin 10.3, hematocrit 29.7, MCV 90.5, platelets 96, neutrophils of 23, her absolute neutrophil count is about 115.  The patient also has an absolute reticulocyte count that is low. Currently I do not have an INR on the patient.  I do not have a CMET or BMET.  Blood cultures and urine cultures have been ordered.  IMPRESSION AND PLAN:  This is a 40 year old female with a history of breast cancer status post chemotherapy admitted for neutropenic Port-A- Cath infection.  She is not febrile. 1. Infected Port-A-Cath.  Serious concern as the patient has an     absolute neutrophil count of 115 and she has pancytopenia.  Blood     cultures have been done.  One of the blood cultures at least was     accessed through the Port-A-Cath.  Will do urine culture.  Will     also do stool C diff as well as stool ova and parasites, stool     cultures.  The patient will be started on vancomycin and Zosyn as     soon as possible.  Will also get a procalcitonin and lactic acid     level. 2. Pancytopenia.  The patient has pancytopenia which is a direct     result of chemotherapy. 3. History of breast cancer status post chemotherapy awaiting     lumpectomy.  This will be further managed by Dr. Mariel Sleet as an     outpatient. 4. History of lupus/systemic lupus erythematosus.  The patient     currently will withhold the lupus medications of Plaquenil for the     time being. 5. History of hemolytic anemia.  The patient's hemoglobin is currently     higher 10.3/ 29.7, previously was 9.3 and 28.0.  The patient's     retic count was very low.  It is unlikely the patient is undergoing     hemolysis right. 6. History of mitral valve replacement on Coumadin.  The patient will     be continued on Coumadin.  The patient told me that recently her     INR was low.  Will get a PT/INR for this patient.  We will withhold     the  Coumadin.     The patient will be placed on a heparin drip.  The Port-A-Cath will     need to be removed. 7. Deep venous thrombosis prophylaxis.  The patient will be placed on     heparin drip.  CODE STATUS:  The patient is a full code.     Rock Nephew, MD  NH/MEDQ  D:  03/10/2010  T:  03/10/2010  Job:  [id number]  cc:   Ladona Horns. Mariel Sleet, MD Fax: [phone]  Corrie Mckusick, M.D. Fax: [phone]  Zenovia Jordan, MD Fax: [phone]  Electronically Signed by Rock Nephew MD on 03/16/2010 10:25:59 AM

## 2010-03-17 LAB — DIFFERENTIAL
Eosinophils Absolute: 0 10*3/uL (ref 0.0–0.7)
Eosinophils Relative: 0 % (ref 0–5)
Lymphocytes Relative: 7 % — ABNORMAL LOW (ref 12–46)
Lymphs Abs: 0.4 10*3/uL — ABNORMAL LOW (ref 0.7–4.0)
Monocytes Absolute: 0.2 10*3/uL (ref 0.1–1.0)
WBC Morphology: INCREASED

## 2010-03-17 LAB — CBC
Hemoglobin: 8.5 g/dL — ABNORMAL LOW (ref 12.0–15.0)
MCH: 32.2 pg (ref 26.0–34.0)
MCHC: 33.7 g/dL (ref 30.0–36.0)
MCV: 95.5 fL (ref 78.0–100.0)
RBC: 2.64 MIL/uL — ABNORMAL LOW (ref 3.87–5.11)

## 2010-03-17 LAB — PROTIME-INR
INR: 2.88 — ABNORMAL HIGH (ref 0.00–1.49)
Prothrombin Time: 30.2 seconds — ABNORMAL HIGH (ref 11.6–15.2)

## 2010-03-17 LAB — URINE CULTURE
Colony Count: NO GROWTH
Culture  Setup Time: 201201261225
Culture: NO GROWTH

## 2010-03-17 LAB — HEPATIC FUNCTION PANEL
Alkaline Phosphatase: 72 U/L (ref 39–117)
Bilirubin, Direct: 0.1 mg/dL (ref 0.0–0.3)
Indirect Bilirubin: 0.2 mg/dL — ABNORMAL LOW (ref 0.3–0.9)
Total Bilirubin: 0.3 mg/dL (ref 0.3–1.2)

## 2010-03-17 LAB — BASIC METABOLIC PANEL
BUN: 2 mg/dL — ABNORMAL LOW (ref 6–23)
CO2: 26 mEq/L (ref 19–32)
Calcium: 8.3 mg/dL — ABNORMAL LOW (ref 8.4–10.5)
Chloride: 104 mEq/L (ref 96–112)
Creatinine, Ser: 0.84 mg/dL (ref 0.4–1.2)
GFR calc Af Amer: >60 mL/min (ref >60)
Glucose, Bld: 90 mg/dL (ref 70–99)

## 2010-03-17 LAB — LIPASE, BLOOD: Lipase: 25 U/L (ref 11–59)

## 2010-03-18 LAB — CBC
HCT: 24.3 % — ABNORMAL LOW (ref 36.0–46.0)
MCHC: 33.7 g/dL (ref 30.0–36.0)
RDW: 17.7 % — ABNORMAL HIGH (ref 11.5–15.5)

## 2010-03-18 LAB — DIFFERENTIAL
Basophils Absolute: 0.1 10*3/uL (ref 0.0–0.1)
Lymphs Abs: 0.3 10*3/uL — ABNORMAL LOW (ref 0.7–4.0)
Monocytes Absolute: 0.2 10*3/uL (ref 0.1–1.0)

## 2010-03-18 LAB — PROTIME-INR
INR: 3.86 — ABNORMAL HIGH (ref 0.00–1.49)
Prothrombin Time: 37.9 seconds — ABNORMAL HIGH (ref 11.6–15.2)

## 2010-03-18 NOTE — Progress Notes (Signed)
NAMEVIRGIL, [name] NO.:  [id number]  MEDICAL RECORD NO.:  [id number]           PATIENT TYPE:  LOCATION:                                 FACILITY:  PHYSICIAN:  Elliot Cousin, M.D.    DATE OF BIRTH:  1970-04-18  DATE OF PROCEDURE: DATE OF DISCHARGE:                                PROGRESS NOTE   SUBJECTIVE:  The patient had several episodes of vomiting with yellow emesis last night.  There was no evidence of coffee-grounds emesis or bilious emesis.  She has no complaints of abdominal pain or pain with urination.  She only had one bowel movement yesterday morning and one bowel movement last night, semi loose.  No evidence of blood.  The nursing staff noted that the patient became febrile again late last night and early this morning.  OBJECTIVE:  VITAL SIGNS:  Temperature 102.7 with a maximum temperature of 102.9, pulse 82, respiratory rate 24, blood pressure 120/84.  Oxygen saturation 95% on room air. GENERAL:  The patient is a pleasant, alert, 40 year old Caucasian woman who is currently sitting up in bed, in no acute distress.  HEENT: Oropharynx reveals no exudates or erythema. CHEST WALL:  The former Port-A-Cath site wound has a tanned base.  No active purulent drainage.  The surrounding erythema has resolved. LUNGS:  Clear to auscultation bilaterally. HEART:  S1 and S2 with an S2 click. ABDOMEN:  Positive bowel sounds, soft, obese, nontender, nondistended. The previous fluctuant bumps have completely resolved. GU AND RECTAL:  Deferred. EXTREMITIES:  Pedal pulses are palpable bilaterally.  No pretibial edema and no pedal edema. NEUROLOGIC:  The patient is alert and oriented x3.  Cranial nerves II- XII are intact.  LABORATORY DATA:  Lipase 25, total bilirubin 0.3, alkaline phosphatase 72, SGOT 18, SGPT 11, total protein 7.6, albumin 2.7.  Sodium 136, potassium 3.7, chloride 104, CO2 26, glucose 90, BUN 2, creatinine 0.84, calcium 8.3.  WBC 6.0,  hemoglobin 8.5, platelet count 99.  PT 30.2, INR 2.88.  Blood cultures on January 26; no growth x1 day.  Blood cultures on March 10, 2010, no growth x5 days.  Followup C diff PCR negative. Chest x-ray; enlargement of cardiac silhouette.  No definite acute infiltrate.  Urinalysis essentially negative.  CONSULTATION:  Ladona Horns. Neijstrom, MD  ASSESSMENT: 1. Recurrent fever.  The patient completed a total of 5 days of     antibiotic therapy with vancomycin and Zosyn before it was     discontinued.  Blood cultures drawn on the 20th were completely     negative.  The catheter tip culture was completely negative.  It     was decided to discontinue the antibiotics because of negative     cultures and  positive Clostridium difficile colitis.     Approximately 36 hours after antibiotics were discontinued, the     patient became febrile again yesterday.  Another set of blood     cultures were ordered along with a urinalysis and chest x-ray.  So     far, all of the investigational studies have been negative.  The  Port-A-Cath site is healing with no active drainage.     She does have a prosthetic mitral valve and     certainly endocarditis is a consideration, but all of her blood     cultures have been negative thus far.  She could certainly have     fever secondary to her breast malignancy and fever from lupus. 2. Clostridium difficile colitis.  The patient was started on     treatment with Flagyl on March 10, 2010.  Because of her     complaints of persistent nausea and vomiting, Flagyl was     discontinued in favor of oral vancomycin.  Altogether, she is on     treatment day #8.  A followup C diff PCR was negative yesterday.     Her diarrhea has mostly resolved.  The plan was to discontinue oral     vancomycin after a total of 10 days of treatment.  However, this     will need to be reconsidered in light of a probable restart of     antibiotics empirically. 3. Chemotherapy induced  pancytopenia.  The patient was treated with     Neupogen for several days.  When her white blood cell count     improved, Neupogen was discontinued.  Dr. Mariel Sleet was consulted     for further evaluation and management.  He recommended IVIG     infusion to improve her hemoglobin and hematocrit rather than blood     transfusions given her history of hemolytic anemia.  He would     prefer that if she needed to be transfused to wait until she had     surgery regarding her breast cancer.  During the initial infusion     of IVIG, the patient had a reaction.  She was treated with 25 mg of     IV Benadryl and 100 mg of Solu-Cortef.  The next day, the IVIG was     infused slowly and she had no further reactions.  Her hemoglobin     today is up to 8.5.  Her WBC is 6.0.  And her platelet count is 99,     all improved. 4. History of [employer] mitral valve replacement.  The patient was     continued on Coumadin for anticoagulation.  Initially Lovenox had     to be given as a bridge.  For the past several days, her INR has     been therapeutic. 5. Hypokalemia and hypomagnesemia.  The patient is being repleted     orally.  Both measures have improved. 6. Probable candidal vaginitis.  The patient complained of itching and     burning, which were consistent with her previous history of     candidal vaginitis.  She was given one oral dose of Diflucan.  Her     symptoms have resolved. 7. Mild hypoxia.  The patient's oxygen saturation periodically falls     into the 88-90% range on room air.  Her chest x-ray revealed no     evidence of pulmonary edema or infiltrates.  The transient hypoxia     may be secondary to atelectasis. 8. Ongoing nausea and vomiting.  The nausea was initially thought to     be secondary to Flagyl.  She has been off Flagyl now for several     days.  Her nausea did improve some.  She was started on scheduled     Zofran before breakfast, lunch, and dinner.  This has continued.      Her abdomen is very soft and nondistended on exam.  Her hepatic     function panel is within normal limits.  Her lipase is within     normal limits.  The etiology of her ongoing nausea is unclear.  She     is being treated supportively. 9. Breast cancer.  The patient had received chemotherapy     as managed by Dr. Mariel Sleet.  The plan is for her to be evaluated     by general surgeon Dr. Ezzard Standing in [facility] following hospital     discharge for a decision about lumpectomy or mastectomy.     Chemotherapy is being held for now.  PLAN: 1. I will start Zosyn and vancomycin again empirically and follow her     fever curve. 2. We will check a 2-D echocardiogram to assess for gross vegetations     although a TEE is the diagnostic test of choice.  I will start with     a transthoracic echocardiogram given that the patient has had no     objective bacteremia. 3. We will change Protonix to 40 mg daily. 4. We will continue oral vancomycin for a total of 10-14 days,     particularly now that she is going to be back on     systemic antibiotics. 5. We will consider Reglan and Phenergan for nausea. 6. We will await Dr. Thornton Papas followup and suggestions.     Elliot Cousin, M.D.     DF/MEDQ  D:  03/17/2010  T:  03/17/2010  Job:  [id number]  Electronically Signed by Elliot Cousin M.D. on 03/18/2010 07:37:20 AM

## 2010-03-19 LAB — DIFFERENTIAL
Basophils Relative: 4 % — ABNORMAL HIGH (ref 0–1)
Eosinophils Absolute: 0 10*3/uL (ref 0.0–0.7)
Monocytes Absolute: 0.4 10*3/uL (ref 0.1–1.0)
Neutrophils Relative %: 71 % (ref 43–77)

## 2010-03-19 LAB — CBC
HCT: 24.7 % — ABNORMAL LOW (ref 36.0–46.0)
MCHC: 33.6 g/dL (ref 30.0–36.0)
RDW: 18.2 % — ABNORMAL HIGH (ref 11.5–15.5)

## 2010-03-19 LAB — POTASSIUM: Potassium: 3.5 mEq/L (ref 3.5–5.1)

## 2010-03-19 LAB — PROTIME-INR: INR: 3.38 — ABNORMAL HIGH (ref 0.00–1.49)

## 2010-03-20 LAB — CBC
HCT: 24.4 % — ABNORMAL LOW (ref 36.0–46.0)
Hemoglobin: 8.3 g/dL — ABNORMAL LOW (ref 12.0–15.0)
RBC: 2.62 MIL/uL — ABNORMAL LOW (ref 3.87–5.11)
WBC: 3.8 10*3/uL — ABNORMAL LOW (ref 4.0–10.5)

## 2010-03-20 LAB — DIFFERENTIAL
Eosinophils Relative: 0 % (ref 0–5)
Lymphocytes Relative: 26 % (ref 12–46)
Lymphs Abs: 1 10*3/uL (ref 0.7–4.0)
Neutro Abs: 2.5 10*3/uL (ref 1.7–7.7)
Neutrophils Relative %: 65 % (ref 43–77)

## 2010-03-20 LAB — ANTI-DNA ANTIBODY, DOUBLE-STRANDED: ds DNA Ab: 10 IU/mL (ref <30)

## 2010-03-20 LAB — PROTIME-INR: INR: 2.57 — ABNORMAL HIGH (ref 0.00–1.49)

## 2010-03-20 LAB — ANTI-SMITH ANTIBODY: ENA SM Ab Ser-aCnc: 3 AU/mL (ref <30)

## 2010-03-20 NOTE — Progress Notes (Signed)
[name], PLASSE NO.:  [id number]  MEDICAL RECORD NO.:  [id number]          PATIENT TYPE:  INP  LOCATION:  IC05                          FACILITY:  [facility]  PHYSICIAN:  Ladona Horns. 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 Status post 1 cycle of epirubicin, 5-FU, Cytoxan, followed by 6 mg of Neulasta.  Cycle 1 was administered on January 31, 2010 and the Neulasta was given on February 01, 2010.  CURRENT THERAPY:  The patient is status post 1 cycle of epirubicin (195 mg), 5-FU (980 mg), Cytoxan (980 mg), and Neulasta for 24 hours following chemotherapy administration of 6 mg.  She was last treated on January 31, 2010.  SUBJECTIVE:  The patient mentioned she feels better today than yesterday.  She admits she has been having some voiding difficulty and not urinating much.  She denies any bowel problems.  She admits that her abdominal pain is better today when compared to yesterday.  She describes this abdominal pain as burning.  She denies headache, dizziness, double vision, fevers, chills, night sweats, nausea, vomiting, diarrhea, constipation, blood in stool, black tarry stool, chest pain, heart palpitations, shortness of breath, urinary pain, urinary frequency, urinary burning, hematuria.  OBJECTIVE:  VITAL SIGNS:  Obtained on February 08, 2010 at 0800 hours revealed a temperature of 98.7.  Vitals obtained on February 08, 2010 at 1200 hours revealed a blood pressure of 94/56, pulse 72, respirations 18. GENERAL:  The patient was seen lying in bed.  Her father is at her bedside.  She does not appear to be in acute distress.  She is alert and oriented x3.  She appears very pleasant. HEENT:  Atraumatic, normocephalic.   Anicteric sclerae. NECK:  No carotid bruits appreciated bilaterally.  No anterior or posterior cervical chain lymphadenopathy appreciated.  No submandibular, submental nodes noted.  Trachea is midline.  Neck was supple. CARDIAC:  Regular rate and rhythm with a systolic click type murmur.  No rub or gallop.  No S3 or S4 appreciated. LUNGS:  Clear to auscultation bilaterally without wheezes, rales, or rhonchi.  No accessory muscle use appreciated. ABDOMEN:  Positive bowel sounds in all 4 quadrants.  Soft, nontender. EXTREMITIES:  No upper or lower extremity edema noted.  Dorsalis pedis pulse is regular and strong at 2+ bilaterally. SKIN:  Warm and dry.  Rapid turgor.  Radial pulses regular and strong at 2+.  Capillary refill less than 1 second in fingers. NEUROLOGIC:  No focal deficits appreciated.  The patient is alert and oriented x3.  LABORATORY DATA:  White blood cell count is less than 0.4, hemoglobin 8.7, hematocrit 24.6, platelet count 36.  Sodium 138, potassium 3.6, chloride 106, bicarbonate 25, BUN 14, creatinine 0.80, glucose 93.  ASSESSMENT: 1. Neutropenia. 2. Left 3.8-cm breast cancer (invasive ductal carcinoma).  She is     triple negative.  Ki-67 92%.  She is status post 1 cycle of     epirubicin, 5-FU, Cytoxan, followed by 6 mg of Neulasta.  Cycle 1     was administered on January 31, 2010 and Neulasta was given on     February 01, 2010. 3. Hypotension. 4. 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 5. Mitral valve disease. 6. Occult sepsis. 7. Oliguria. 8. Abdominal pain. 9. Pancytopenia. 10.Obesity. 11.Positive family history of breast cancer in maternal grandmother. 12.Obesity. 13.Uterine ablation.  PLAN:  We will obtain a SPEP with a quantitative IFE.  We are doing this to check her immunoglobulins.  We will entertain  the idea of administering IVIG to the patient.  The patient and plan discussed with Dr. Mariel Sleet and he is in agreement with the aforementioned.    ______________________________ Dellis Anes, PA   ______________________________ Ladona Horns. Mariel Sleet, MD    TK/MEDQ  D:  02/09/2010  T:  02/10/2010  Job:  [id number]  Electronically Signed by Dellis Anes III PA on 03/02/2010 04:01:04 PM Electronically Signed by Glenford Peers MD on 03/20/2010 04:50:18 PM

## 2010-03-20 NOTE — Progress Notes (Signed)
[name], [name] NO.:  [id number]  MEDICAL RECORD NO.:  [id number]          PATIENT TYPE:  INP  LOCATION:  IC05                          FACILITY:  [facility]  PHYSICIAN:  Ladona Horns. Jcion Buddenhagen, MD  DATE OF BIRTH:  [birth year]  DATE OF PROCEDURE: DATE OF DISCHARGE:                                PROGRESS NOTE   DIAGNOSIS:  This is a 40 year old Caucasian female with a left 3.8-cm invasive ductal carcinoma of the breast.  Triple negative.  Ki-67 is 92%.  CURRENT THERAPY:  The patient is status post one cycle of epirubicin (195 mg), 5-FU (980 mg), Cytoxan (980 mg), followed by Neulasta 6 mg 24 hours following administration of chemotherapy.  Cycle one was administered on January 31, 2010, and Neulasta was given on February 01, 2010.  SUBJECTIVE:  The patient states she feels much better today when compared to yesterday.  She admits to some "burning" abdominal pain. She illustrates that she "cannot wait to have the catheter removed."  She denies headache, dizziness, double vision, fevers, chills, night sweats, nausea, vomiting, diarrhea, constipation, blood in stool, black tarry stool, chest pain, heart palpitations, shortness of breath, urinary pain, urinary frequency, urinary burning, hematuria.  OBJECTIVE:  VITALS:  Vitals obtained on February 09, 2010, at 1200 hours reveals a temperature 97.8, pulse 57, blood pressure 102/57, respirations 18, oxygen saturation 100% on room air. GENERAL:  The patient is seen awake in bed.  Two family members, Brook and Coleman, are present.  The patient does not appear to be in acute distress.  She is alert and oriented x3.  She appears very pleasant. HEENT:  Atraumatic, normocephalic.  Anicteric sclerae. NECK:  No carotid bruits appreciated bilaterally.  No anterior or posterior cervical chain lymphadenopathy appreciated.  No submandibular or submental nodes noted.  Trachea is midline.  Neck is supple. CARDIAC:  Regular rate  and rhythm with a positive systolic click murmur. No rub or gallop appreciated.  No S3 or S4 noted. LUNGS:  Clear to auscultation without wheezes, rales or rhonchi.  Cough produced with deep inspiration.  No accessory muscle use noted. ABDOMEN:  Positive bowel sounds in all 4 quadrants.  Soft, nontender. EXTREMITIES:  No upper or lower extremity edema noted bilaterally. Dorsalis pedis pulse is regular and strong at 2+. SKIN:  Warm and dry.  Rapid turgor.  Radial pulse is regular and strong at 2+.  Capillary refill less 1 second in fingers. NEURO:  No focal deficits appreciated.  The patient is alert and oriented x3. Foley catheter in place with urine production noted.  LABORATORY DATA:  Laboratory data obtained on February 09, 2010, reveals a white blood cell count of 0.4, hemoglobin 9.1, hematocrit 25.3, platelet 29.  Sodium 138, potassium 3.7, chloride 112, bicarbonate 22, BUN 5, creatinine 0.51, glucose 101, alkaline phosphatase 38, total protein 5.1, blood albumin 2.8, calcium 7.5.  RADIOGRAPHIC STUDIES:  CT of abdomen with contrast obtained on February 08, 2010, reveals a gallbladder wall that appears prominent measuring 4- mm.  Correlate for any clinical signs or symptoms of cholecystitis.  If further imaging is clinically indicated recommend either right  upper quadrant ultrasound or hepatobiliary scan.  Mild intrahepatic biliary prominence.  No common bile duct dilation identified.  ASSESSMENT: 1. Neutropenia. 2. Left 3.8-cm breast cancer (invasive ductal carcinoma).  Triple     negative.  Ki-67 92%.  The patient is status post one cycle of     epirubicin, 5-FU, and Cytoxan, followed by 6 mg of Neulasta.  Cycle     one was administered on January 31, 2010, and Neulasta was given     on February 01, 2010. 3. Hypotension. 4. Systemic lupus erythematosus with Raynaud phenomenon, joint pain,     lipids anticoagulant positivity, thrombocytopenia, autoimmune     hemolytic anemia  with a warm antibody detected. 5. Mitral valve disease. 6. Occult sepsis. 7. Oliguria. 8. Abdominal pain. 9. Pancytopenia. 10.Obesity. 11.Family history of breast cancer in maternal grandfather. 12.Obesity. 13.Uterine ablation.  PLAN: 1. We are awaiting results from SPEP and qualitative IFE. 2. The patient and plan discussed with Dr. Mariel [name] and he is in     agreement with the aforementioned.    ______________________________ Dellis Anes, PA   ______________________________ Ladona Horns. [name] Sleet, MD    TK/MEDQ  D:  02/09/2010  T:  02/10/2010  Job:  [id number]  Electronically Signed by Dellis Anes III PA on 03/02/2010 04:02:19 PM Electronically Signed by Glenford Peers MD on 03/20/2010 04:51:29 PM

## 2010-03-21 ENCOUNTER — Encounter: Payer: Self-pay | Admitting: Cardiology

## 2010-03-21 LAB — CBC
HCT: 26 % — ABNORMAL LOW (ref 36.0–46.0)
MCV: 93.5 fL (ref 78.0–100.0)
Platelets: 170 10*3/uL (ref 150–400)
RBC: 2.78 MIL/uL — ABNORMAL LOW (ref 3.87–5.11)
WBC: 6 10*3/uL (ref 4.0–10.5)

## 2010-03-21 LAB — DIFFERENTIAL
Basophils Relative: 2 % — ABNORMAL HIGH (ref 0–1)
Lymphs Abs: 1.4 10*3/uL (ref 0.7–4.0)
Monocytes Relative: 7 % (ref 3–12)
Neutro Abs: 4.1 10*3/uL (ref 1.7–7.7)
Neutrophils Relative %: 68 % (ref 43–77)
WBC Morphology: INCREASED

## 2010-03-21 LAB — BASIC METABOLIC PANEL
BUN: 2 mg/dL — ABNORMAL LOW (ref 6–23)
Chloride: 100 mEq/L (ref 96–112)
GFR calc non Af Amer: >60 mL/min (ref >60)
Potassium: 4 mEq/L (ref 3.5–5.1)
Sodium: 131 mEq/L — ABNORMAL LOW (ref 135–145)

## 2010-03-21 LAB — CULTURE, BLOOD (ROUTINE X 2): Culture: NO GROWTH

## 2010-03-21 LAB — COMPLEMENT, TOTAL: Compl, Total (CH50): >60 U/mL — ABNORMAL HIGH (ref 31–60)

## 2010-03-21 LAB — PROTIME-INR: Prothrombin Time: 20.2 seconds — ABNORMAL HIGH (ref 11.6–15.2)

## 2010-03-21 LAB — C-REACTIVE PROTEIN: CRP: 1.3 mg/dL — ABNORMAL HIGH (ref <0.6)

## 2010-03-22 DIAGNOSIS — R55 Syncope and collapse: Secondary | ICD-10-CM

## 2010-03-22 DIAGNOSIS — C50919 Malignant neoplasm of unspecified site of unspecified female breast: Secondary | ICD-10-CM

## 2010-03-22 LAB — PROTIME-INR
INR: 2.45 — ABNORMAL HIGH (ref 0.00–1.49)
Prothrombin Time: 26.7 seconds — ABNORMAL HIGH (ref 11.6–15.2)

## 2010-03-22 LAB — HIV ANTIBODY (ROUTINE TESTING W REFLEX): HIV: NONREACTIVE

## 2010-03-22 LAB — DIFFERENTIAL
Lymphocytes Relative: 27 % (ref 12–46)
Lymphs Abs: 1.7 10*3/uL (ref 0.7–4.0)
Monocytes Relative: 5 % (ref 3–12)
Neutrophils Relative %: 66 % (ref 43–77)
WBC Morphology: INCREASED

## 2010-03-22 LAB — SEDIMENTATION RATE: Sed Rate: 118 mm/hr — ABNORMAL HIGH (ref 0–22)

## 2010-03-22 LAB — CBC
HCT: 28.8 % — ABNORMAL LOW (ref 36.0–46.0)
Hemoglobin: 9.7 g/dL — ABNORMAL LOW (ref 12.0–15.0)
RBC: 3.07 MIL/uL — ABNORMAL LOW (ref 3.87–5.11)

## 2010-03-23 ENCOUNTER — Inpatient Hospital Stay (HOSPITAL_COMMUNITY): Payer: PRIVATE HEALTH INSURANCE

## 2010-03-23 DIAGNOSIS — I409 Acute myocarditis, unspecified: Secondary | ICD-10-CM

## 2010-03-23 DIAGNOSIS — C50919 Malignant neoplasm of unspecified site of unspecified female breast: Secondary | ICD-10-CM

## 2010-03-23 LAB — DIFFERENTIAL
Basophils Relative: 3 % — ABNORMAL HIGH (ref 0–1)
Eosinophils Absolute: 0 10*3/uL (ref 0.0–0.7)
Eosinophils Relative: 0 % (ref 0–5)
Monocytes Absolute: 0.5 10*3/uL (ref 0.1–1.0)
Monocytes Relative: 8 % (ref 3–12)

## 2010-03-23 LAB — URINALYSIS, ROUTINE W REFLEX MICROSCOPIC
Ketones, ur: 40 mg/dL — AB
Protein, ur: NEGATIVE mg/dL
Urine Glucose, Fasting: NEGATIVE mg/dL

## 2010-03-23 LAB — CBC
Hemoglobin: 9.3 g/dL — ABNORMAL LOW (ref 12.0–15.0)
MCH: 31.5 pg (ref 26.0–34.0)
MCHC: 33.8 g/dL (ref 30.0–36.0)
RDW: 18.7 % — ABNORMAL HIGH (ref 11.5–15.5)

## 2010-03-23 LAB — RHEUMATOID FACTOR: Rhuematoid fact SerPl-aCnc: <10 IU/mL (ref <=14)

## 2010-03-23 NOTE — Op Note (Signed)
Summary: Operative Report  (DR.HOXWORTH)  Operative Report  (DR.HOXWORTH)   Imported By: Zachary George 02/24/2010 10:57:02  _____________________________________________________________________  External Attachment:    Type:   Image     Comment:   External Document

## 2010-03-23 NOTE — Consult Note (Signed)
Summary: [facility] DR. Caidence Kaseman   [facility] DR[facility] Tsering Leaman   Imported By: Zachary George 02/24/2010 10:46:24  _____________________________________________________________________  External Attachment:    Type:   Image     Comment:   External Document

## 2010-03-23 NOTE — Progress Notes (Signed)
Summary: subtherapeutic coumadin      Phone Note Other Incoming Call back at [phone]    Caller: Dr. Jerelyn Scott  Summary of Call: Patient in office today with Dr. Mariel Sleet.  Now on subtherapeutic doses of coumadin.  Dose on currently is what was giiven at hosp d/c yesterday.  Suggested to him that we could forward message to [facility] (coumadin nurse) for dosing and we could call the patient since she handled her bridging process prior to surgery.  MD did not want to do this.  Wanted to speak to MD himself.  Please call Dr. Jerelyn Scott as Dr. Andee Lineman is out of the office.   Also, prior to her bridging issue, her coumadin was managed by her PMD.  Follow-up for Phone Call        Information was forwarded to me today. I spoke with Dr. Mariel Sleet. His main concerns were about Coumadin followup for appropriate dosing, and also the fact that the patient was reportedly told not to resume many of her prior cardiac medications. I reviewed the hospital discharge summary from 1/3. Her medication list did not include prior doses of metoprolol, benzepril, and Lasix. INR at discharge was 2.8. She is scheduled to be seen in the [facility] Coumadin clinic this Friday. Dr. Mariel Sleet stated that the patient was clinically stable and "looked good." We will have her seen clinically on Friday in conjunction with her Coumadin check, and can determine if cardiac medications can or should be resumed, and on what timeframe. Follow-up by: Loreli Slot, MD, [facility] [place],  February 22, 2010 12:09 PM  Additional Follow-up for Phone Call Additional follow up Details #1::        Left message to return call.  Hoover Brunette, LPN  February 22, 2010 12:18 PM   Spoke with patient.  OV scheduled for Friday, 1/6 at 10:40 with Gene per SM.  Will need [name] to check coumadin today also.   Additional Follow-up by: Hoover Brunette, LPN,  February 22, 2010 3:58 PM

## 2010-03-23 NOTE — Letter (Signed)
Summary: [place]/ D.C DR. Ladona Ridgel  [place]/ D.C DR. Ladona Ridgel   Imported By: Zachary George 02/24/2010 10:59:53  _____________________________________________________________________  External Attachment:    Type:   Image     Comment:   External Document

## 2010-03-23 NOTE — Medication Information (Signed)
Summary: ccr-lr  Anticoagulant Therapy  Managed by: Vashti Hey, RN PCP: Dr. Assunta Found Supervising MD: Andee Lineman MD, Michelle Piper Indication 1: Mechanical Prosthetic Heart Valve (prevent systemic emboli) Valve Type: St Judes -- Mechanical Valve Position: Mitral Lab Used: [facility] [place] Site: [place] INR POC 2.6  Dietary changes: no    Health status changes: no    Bleeding/hemorrhagic complications: no    Recent/future hospitalizations: yes       Details: In [facility] for gallbladder surgery  Any changes in medication regimen? no    Recent/future dental: no  Any missed doses?: no       Is patient compliant with meds? yes       Allergies: No Known Drug Allergies  Anticoagulation Management History:      The patient is taking warfarin and comes in today for a routine follow up visit.  Negative risk factors for bleeding include an age less than 72 years old.  The bleeding index is 'low risk'.  Negative CHADS2 values include Age > 59 years old.  Anticoagulation responsible provider: Andee Lineman MD, Michelle Piper.  INR POC: 2.6.    Anticoagulation Management Assessment/Plan:      The patient's current anticoagulation dose is Warfarin sodium 5 mg tabs: Use as directed by Dr. Phillips Odor.  The target INR is 2.5-3.5.  The next INR is due 03/02/2010.  Anticoagulation instructions were given to patient.  Results were reviewed/authorized by Vashti Hey, RN.  She was notified by Vashti Hey RN.         Prior Anticoagulation Instructions: INR 4.1 Hold coumadin tonight then decrease dose to 5mg  once daily except 2.5mg  on M,W,F Has chemo on 02/14/10  Current Anticoagulation Instructions: INR 2.6 In hospital for gallbladder surgery.  D/C INR 2.8 on coumadin 2.5mg  once daily except 5mg  on M,W,F.  No Abx now.  Chemo on hold. Increase coumadin to 5mg  once daily  Recheck at [facility] appt 03/02/10

## 2010-03-23 NOTE — Assessment & Plan Note (Signed)
Summary: rov - discuss med / eph --agh   Visit Type:  Follow-up Primary Provider:  Dr. 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 The patient also has SLE. She had a post pericardiotomy syndrome post mitral valve replacement.  She's been recently diagnosed with breast cancer. She underwent chemotherapy and developed neutropenic fevers and sepsis syndrome secondary to acute cholecystitis. She underwent laparoscopic cholecystectomy. She was discharged earlier this month from [facility]. Unfortunately there was a significant amount of confusion regarding her discharge medications. She was never restarted on metoprolol, benazepril and Lasix. She is back on her Coumadin and is followed in the Coumadin clinic and is therapeutic. Since hospital discharge she has had some low-grade fevers. As a matter fact one occasion she recorded a temperature of 101.5 approximately10 days after surgery.the patient currently does not appear septic. Her vital signs are stable. She denies any chest pain shortness of breath orthopnea PND. She will have her PT INR checked today.  Preventive Screening-Counseling & Management  Alcohol-Tobacco     Smoking Status: quit     Year Quit: 2008  Current Medications (verified): 1)  Folic Acid 1 Mg Tabs (Folic Acid) .... Take 1 Tablet By Mouth Once A Day 2)  Hydroxychloroquine Sulfate 200 Mg Tabs (Hydroxychloroquine Sulfate) .... Take 1 Tablet By Mouth Two Times A Day 3)  Warfarin Sodium 5 Mg Tabs (Warfarin Sodium) .... Use As Directed By Dr. Phillips Odor 4)  Cyanocobalamin 1000 Mcg/ml Soln (Cyanocobalamin) .Marland Kitchen.. 1 Injection Every 3 Months 5)  Ativan 1 Mg Tabs (Lorazepam) .... Take 1 Tablet By Mouth Four Times A Day As Needed 6)  Emla 2.5-2.5 % Crea (Lidocaine-Prilocaine) .... Use 1 Hr. Before Chemo. Tx. 7)  Ibuprofen 800 Mg Tabs (Ibuprofen) ....  Take One Tablet By Mouth As Needed For Headaches 8)  Prilosec Otc 20 Mg Tbec (Omeprazole Magnesium) .... Take 1 Tablet By Mouth Once A Day 9)  Prochlorperazine 25 Mg Supp (Prochlorperazine) .... Take As Needed For Nausea 10)  Transderm-Scop 1.5 Mg Pt72 (Scopolamine Base) .... Use As Needed For Nausea 11)  Synthroid 25 Mcg Tabs (Levothyroxine Sodium) .... Take 1 Tablet Per Day 12)  Chemotherapy .... Cytoxan 980mg , Epirubicin 195mg ,fluorouracil 980mg  13)  Cyclobenzaprine Hcl 10 Mg Tabs (Cyclobenzaprine Hcl) .... Take 1 Tablet By Mouth Two Times A Day As Needed 14)  Ibuprofen 800 Mg Tabs (Ibuprofen) .... Take 1 Tablet By Mouth Two Times A Day 15)  Metoprolol Tartrate 25 Mg Tabs (Metoprolol Tartrate) .... Take 1/2 Tab (12.5mg ) Two Times A Day 16)  Lasix 40 Mg Tabs (Furosemide) .... Take 1/2 Tab (20mg ) Daily  Allergies (verified): No Known Drug Allergies  Comments:  Nurse/Medical Assistant: The patient's medications and allergies were reviewed with the patient and were updated in the Medication and Allergy Lists. reviewed list w/ pt. Tammi Romine CMA (February 24, 2010 11:16 AM)  Past History:  Past Medical History: Raynaud phenomenon  lupus erythematosus neutropenic fevers Status post cholecystectomy, laparoscopic Hypothyroidism Breast cancer undergoing chemotherapy Postoperative fevers  Vital Signs:  Patient profile:   40 year old female Height:      64 inches Weight:      186 pounds BMI:     32.04 Pulse rate:   94 / minute BP sitting:   113 / 78  (left arm) Cuff size:   regular  Physical Exam  Additional Exam:  General: Well-developed, well-nourished in no distress head: Normocephalic and atraumatic, butterfly rash eyes PERRLA/EOMI intact, conjunctiva and lids normal nose: No deformity or lesions mouth normal dentition, normal posterior pharynx neck: Supple, no JVD.  No masses, thyromegaly or abnormal cervical nodes lungs: Normal breath sounds bilaterally without  wheezing.  Normal percussion heart: regular rate and rhythm with normal closing click of S1 and S2, no S3 or S4.  PMI is normal.  No pathological murmurs abdomen: Normal bowel sounds, abdomen is soft and nontender without masses, organomegaly or hernias noted.  No hepatosplenomegaly musculoskeletal: Back normal, normal gait muscle strength and tone normal pulsus: Pulse is normal in all 4 extremities Extremities: No peripheral pitting edema neurologic: Alert and oriented x 3 skin: Intact without lesions or rashes cervical nodes: No significant adenopathy psychologic: Normal affect    Impression & Recommendations:  Problem # 1:  COUMADIN THERAPY (ICD-V58.61) patient will remain on Coumadin. We'll check PT/INR today goal INR is between 2.5 and 3.5.  Problem # 2:  MITRAL VALVE DISORDERS (ICD-424.0) the patient is status post mitral valve replacement. It does not appear that she had bacteremia during her hospitalization but continued to have postoperative fevers. We will check blood cultures to make sure the patient is not at risk for endocarditis. She did have a fever more than 7 days after surgery.we will also restart the patient on her prior medications including metoprolol 12.5 mg p.o. b.i.d. and Lasix 20 mg p.o. q. daily. The following medications were removed from the medication list:    Metoprolol Tartrate 25 Mg Tabs (Metoprolol tartrate) .Marland Kitchen... Take 1/2 tablet by mouth twice a day    Furosemide 40 Mg Tabs (Furosemide) .Marland Kitchen... Take 1 tablet by mouth once a day    Benazepril Hcl 5 Mg Tabs (Benazepril hcl) .Marland Kitchen... Take 1 tablet by mouth once a day Her updated medication list for this problem includes:    Metoprolol Tartrate 25 Mg Tabs (Metoprolol tartrate) .Marland Kitchen... Take 1/2 tab (12.5mg ) two times a day    Lasix 40 Mg Tabs (Furosemide) .Marland Kitchen... Take 1/2 tab (20mg ) daily  Orders: T-Culture, Blood Routine ([id number])  Problem # 3:  FEVER PRESENTING CONDITIONS CLASSIFIED ELSEWHERE (ICD-780.61) the  patient had some postoperative fevers. We will obtain blood cultures.  Problem # 4:  BREAST CANCER (ICD-174.9) the patient is scheduled for future chemotherapy but we will need to rule out endocarditis first  Problem # 5:  CHOLECYSTECTOMY, LAPAROSCOPIC, HX OF (ICD-V45.79) Assessment: Comment Only  Patient Instructions: 1)  Lab:  blood culture x 2  2)  Restart Metoprolol 12.5mg  two times a day  3)  Lasix 20mg  daily 4)  Follow up in  7-10 days

## 2010-03-23 NOTE — Medication Information (Signed)
Summary: ccr-lr  Anticoagulant Therapy  Managed by: Vashti Hey, RN PCP: Dr. Assunta Found Supervising MD: Andee Lineman MD, Michelle Piper Indication 1: Mechanical Prosthetic Heart Valve (prevent systemic emboli) Valve Type: St Judes -- Mechanical Valve Position: Mitral Lab Used: [facility] [place] Site: [place] INR POC 2.9  Dietary changes: no    Health status changes: no    Bleeding/hemorrhagic complications: no    Recent/future hospitalizations: yes       Details: S/P port a cath placement 01/30/10  Any changes in medication regimen? yes       Details: has been on lovenox bid  Recent/future dental: no  Any missed doses?: no       Is patient compliant with meds? yes       Allergies: No Known Drug Allergies  Anticoagulation Management History:      The patient is taking warfarin and comes in today for a routine follow up visit.  Negative risk factors for bleeding include an age less than 42 years old.  The bleeding index is 'low risk'.  Negative CHADS2 values include Age > 42 years old.  Anticoagulation responsible provider: Andee Lineman MD, Michelle Piper.  INR POC: 2.9.    Anticoagulation Management Assessment/Plan:      The patient's current anticoagulation dose is Warfarin sodium 5 mg tabs: Use as directed by Dr. Phillips Odor.  The target INR is 2.5-3.5.  The next INR is due 02/07/2010.  Anticoagulation instructions were given to patient.  Results were reviewed/authorized by Vashti Hey, RN.  She was notified by Vashti Hey RN.         Prior Anticoagulation Instructions: INR 2.9 Pt will take last dose of coumadin on 01/24/10.  She will be bridged with lovenox 80mg  two times a day starting 12/8 pm dose.  After surgery on 12/12 she will resume coumadin and Lovenox until INR > 2.5   She will take coumadin 7mg  12/12, 10mg  12/13 & 12/14 then 7mg  on 12/15 and recheck INR on 12/16  Current Anticoagulation Instructions: INR 2.9 Take coumadin 5mg  once daily due to hematoma at port a cath site Stop  Lovenox Send to hospital for blood cultures recheck INR on 02/07/10 Prescriptions: PERCOCET 5-325 MG TABS (OXYCODONE-ACETAMINOPHEN) Take 1-2 tablets every 4-6 hrs by mouth as needed pain  #20 x 0   Entered by:   Cyril Loosen, RN, BSN   Authorized by:   Lewayne Bunting, MD, [employer]   Signed by:   Cyril Loosen, RN, BSN on 02/03/2010   Method used:   Handwritten   RxID:   [id number]

## 2010-03-23 NOTE — Assessment & Plan Note (Signed)
Summary: f1w --agh   Visit Type:  Follow-up Primary Provider:  Dr. 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 The patient has SLE. She also had post pericardiotomy syndrome post mitral valve replacement. She has been diagnosed with breast cancer and is undergoing chemotherapy. I saw her during her last visit after she had neutropenic fevers and sepsis syndrome secondary to acute cholecystitis. She had undergone laparoscopic cholecystectomy. There was some confusion regarding her discharge medications and particularly her antihypertensive medications and Coumadin. She also had postoperative fevers up to 101.5 Fahrenheit. Blood cultures were drawn and were all within normal limits after 5 days. The patient is now doing much better. She's had no recurrent fevers. She reports no shortness of breath. She was restarted on Lasix and metoprolol. Her blood pressure has remained stable. Earlier last week she received another course of chemotherapy with 25 percent reduction in dosing regimen. She states that she is doing well. She reports no shortness of breath or chest pain. She reports no cardiovascular symptoms. Her PT INR was only 1.8 today and she was redose of Coumadin.  Preventive Screening-Counseling & Management  Alcohol-Tobacco     Smoking Status: quit     Year Quit: 2008  Current Medications (verified): 1)  Folic Acid 1 Mg Tabs (Folic Acid) .... Take 1 Tablet By Mouth Once A Day 2)  Hydroxychloroquine Sulfate 200 Mg Tabs (Hydroxychloroquine Sulfate) .... Take 1 Tablet By Mouth Two Times A Day 3)  Warfarin Sodium 5 Mg Tabs (Warfarin Sodium) .... Use As Directed By Dr. Phillips Odor 4)  Cyanocobalamin 1000 Mcg/ml Soln (Cyanocobalamin) .Marland Kitchen.. 1 Injection Every 3 Months 5)  Ativan 1 Mg Tabs (Lorazepam) .... Take 1 Tablet By Mouth Four Times A Day As Needed 6)  Emla 2.5-2.5 %  Crea (Lidocaine-Prilocaine) .... Use 1 Hr. Before Chemo. Tx. 7)  Prilosec Otc 20 Mg Tbec (Omeprazole Magnesium) .... Take 1 Tablet By Mouth Once A Day 8)  Prochlorperazine 25 Mg Supp (Prochlorperazine) .... Take As Needed For Nausea 9)  Transderm-Scop 1.5 Mg Pt72 (Scopolamine Base) .... Use As Needed For Nausea 10)  Synthroid 25 Mcg Tabs (Levothyroxine Sodium) .... Take 1 Tablet Per Day 11)  Chemotherapy .... Cytoxan 980mg , Epirubicin 195mg ,fluorouracil 980mg  12)  Cyclobenzaprine Hcl 10 Mg Tabs (Cyclobenzaprine Hcl) .... Take 1 Tablet By Mouth Two Times A Day As Needed 13)  Ibuprofen 800 Mg Tabs (Ibuprofen) .... Take 1 Tablet By Mouth Two Times A Day 14)  Metoprolol Tartrate 25 Mg Tabs (Metoprolol Tartrate) .... Take 1/2 Tab (12.5mg ) Two Times A Day 15)  Lasix 40 Mg Tabs (Furosemide) .... Take 1/2 Tab (20mg ) Daily  Allergies (verified): No Known Drug Allergies  Comments:  Nurse/Medical Assistant: The patient's medications and allergies were verbally reviewed with the patient and were updated in the Medication and Allergy Lists.  Past History:  Past Surgical History: Last updated: 12/06/2008 endometrial surgery Valve Replacement-Mitral (S/P)  Family History: Last updated: 03/07/2010 Negative FH of Diabetes, Hypertension, or Coronary Artery Disease  Social History: Last updated: 12/06/2008 Full Time Married  Tobacco Use - Former.  Alcohol Use - no Regular Exercise - no Drug Use - no  Risk Factors: Smoking Status: quit (03/07/2010)  Past Medical History: Raynaud phenomenon  lupus erythematosus neutropenic fevers Status post cholecystectomy, laparoscopic Hypothyroidism Breast cancer undergoing chemotherapy Postoperative fevers negative blood cultures  Family History: Negative FH of Diabetes,  Hypertension, or Coronary Artery Disease  Social History: Reviewed history from 12/06/2008 and no changes required. Full Time Married  Tobacco Use - Former.  Alcohol Use -  no Regular Exercise - no Drug Use - no  Review of Systems       The patient complains of fatigue.  The patient denies malaise, fever, weight gain/loss, vision loss, decreased hearing, hoarseness, chest pain, palpitations, shortness of breath, prolonged cough, wheezing, sleep apnea, coughing up blood, abdominal pain, blood in stool, nausea, vomiting, diarrhea, heartburn, incontinence, blood in urine, muscle weakness, joint pain, leg swelling, rash, skin lesions, headache, fainting, dizziness, depression, anxiety, enlarged lymph nodes, easy bruising or bleeding, and environmental allergies.    Vital Signs:  Patient profile:   40 year old female Height:      64 inches Weight:      176 pounds Pulse rate:   71 / minute BP sitting:   89 / 65  (left arm) Cuff size:   regular  Vitals Entered By: Carlye Grippe (March 07, 2010 9:19 AM)  Serial Vital Signs/Assessments:  Time      Position  BP       Pulse  Resp  Temp     By 9:25 AM             95/69    71                    Carlye Grippe  Comments: 9:25 AM right arm/reg cuff By: Carlye Grippe    Physical Exam  Additional Exam:  General: Well-developed, well-nourished in no distress head: Normocephalic and atraumatic, butterfly rash eyes PERRLA/EOMI intact, conjunctiva and lids normal nose: No deformity or lesions mouth normal dentition, normal posterior pharynx neck: Supple, no JVD.  No masses, thyromegaly or abnormal cervical nodes lungs: Normal breath sounds bilaterally without wheezing.  Normal percussion heart: regular rate and rhythm with normal closing click of S1 and S2, no S3 or S4.  PMI is normal.  No pathological murmurs abdomen: Normal bowel sounds, abdomen is soft and nontender without masses, organomegaly or hernias noted.  No hepatosplenomegaly musculoskeletal: Back normal, normal gait muscle strength and tone normal pulsus: Pulse is normal in all 4 extremities Extremities: No peripheral pitting edema neurologic:  Alert and oriented x 3 skin: Intact without lesions or rashes cervical nodes: No significant adenopathy psychologic: Normal affect    Impression & Recommendations:  Problem # 1:  COUMADIN THERAPY (ICD-V58.61) patient was subtherapeutic today. We gave her 10 mg of Coumadin today and redosed her at 7.5 mg p.o. q. daily with followup in the next couple of days  Problem # 2:  BREAST CANCER (ICD-174.9) patient is now undergoing successful chemotherapy. She's had no recurrent fevers.blood cultures have remained negative. No evidence of endocarditis.  Problem # 3:  MITRAL VALVE DISORDERS (ICD-424.0) the patient is status post St. Jude mitral valve replacement. On physical examination heart sounds are within normal limit with a crisp closing click of S1. There is no clinical evidence of valve malfunction. Her updated medication list for this problem includes:    Metoprolol Tartrate 25 Mg Tabs (Metoprolol tartrate) .Marland Kitchen... Take 1/2 tab (12.5mg ) two times a day    Lasix 40 Mg Tabs (Furosemide) .Marland Kitchen... Take 1/2 tab (20mg ) daily  Patient Instructions: 1)  Your physician recommends that you continue on your current medications as directed. Please refer to the Current Medication list given to you today. 2)  Follow up in  6 months

## 2010-03-23 NOTE — Letter (Signed)
Summary: Appointment - Missed  [facility]  [address] [place] [address]   Phone: [phone]  Fax: [phone]     March 13, 2010 MRN: [id number]   [name] [address] [place]  [address]   Dear Ms. Marker,  Our records indicate you missed your appointment on     03/13/10 COUMADIN CLINIC                             It is very important that we reach you to reschedule this ap/pointment. We look forward to participating in your health care needs. Please contact us at the number listed above at your earliest convenience to reschedule this appointment.     Sincerely,    Glass blower/designer

## 2010-03-23 NOTE — Letter (Signed)
Summary: [place] D/C   [place] D/C   Imported By: Zachary George 02/24/2010 10:45:52  _____________________________________________________________________  External Attachment:    Type:   Image     Comment:   External Document

## 2010-03-23 NOTE — Medication Information (Signed)
Summary: ccrv  --agh  Anticoagulant Therapy  Managed by: Vashti Hey, RN PCP: Dr. Assunta Found Supervising MD: Andee Lineman MD, Michelle Piper Indication 1: Mechanical Prosthetic Heart Valve (prevent systemic emboli) Valve Type: St Judes -- Mechanical Valve Position: Mitral Lab Used: [facility] [place] Site: [place] INR POC 1.1  Dietary changes: no    Health status changes: no    Bleeding/hemorrhagic complications: no    Recent/future hospitalizations: no    Any changes in medication regimen? yes       Details: had chemo Tx on 03/03/10   Next Tx in 2 wks  Recent/future dental: no  Any missed doses?: yes     Details: Denies missing doses  Is patient compliant with meds? yes       Allergies: No Known Drug Allergies  Anticoagulation Management History:      The patient is taking warfarin and comes in today for a routine follow up visit.  Negative risk factors for bleeding include an age less than 38 years old.  The bleeding index is 'low risk'.  Negative CHADS2 values include Age > 14 years old.  Anticoagulation responsible provider: Andee Lineman MD, Michelle Piper.  INR POC: 1.1.  Cuvette Lot#: [id number].    Anticoagulation Management Assessment/Plan:      The patient's current anticoagulation dose is Warfarin sodium 5 mg tabs: Use as directed by Dr. Phillips Odor.  The target INR is 2.5-3.5.  The next INR is due 03/13/2010.  Anticoagulation instructions were given to patient.  Results were reviewed/authorized by Vashti Hey, RN.  She was notified by Vashti Hey RN.         Prior Anticoagulation Instructions: INR 2.6 In hospital for gallbladder surgery.  D/C INR 2.8 on coumadin 2.5mg  once daily except 5mg  on M,W,F.  No Abx now.  Chemo on hold. Increase coumadin to 5mg  once daily  Recheck at [facility] appt 03/02/10  Current Anticoagulation Instructions: INR 1.1 Denies missing doses. Discussed wih Dr Andee Lineman Take coumadin 10mg  tonight then increase to 7mg  once daily

## 2010-03-23 NOTE — Progress Notes (Signed)
Summary: cardiac clearance   Phone Note Call from Patient Call back at [phone]   Summary of Call: Wanted to know if okay to have MRI with her mechanical AVR.  Stated that she did give radiology a copy of her card with info on it.  Also, advised her that if she wanted confirmation for herself she could call the customer service number on back of card to discuss further.  Also, stated that she has just found out yesterday that she has breast cancer.  Has lump in left breast.  Will be having consult with surgeon soon.  Not sure if they will be doing lumpectomy or mastectomy.  Advised her to have them fax [place] to request cardiac clearance.  Should be okay since she was just seen in September and doing well from a cardiac standpoint.  PMD is managing her coumadin currently.  Bridging could be handled by them or [place].  Patiient will keep [place] infromed.   Initial call taken by: Hoover Brunette, LPN,  December 30, 2009 4:15 PM  Follow-up for Phone Call        MRI should be safe.  Is there anything else we need to do.? Follow-up by: Lewayne Bunting, MD, [facility],  January 03, 2010 5:38 AM  Additional Follow-up for Phone Call Additional follow up Details #1::        Patient notified.   Will call if need [place] further. Additional Follow-up by: Hoover Brunette, LPN,  January 06, 2010 10:22 AM

## 2010-03-23 NOTE — Letter (Signed)
Summary: Appointment - Missed  [facility]  [address], [place] [address]   Phone: [phone]  Fax: [phone]     February 15, 2010 MRN: [id number]   [name] [address], [place]  [address]   Dear Ms. Chewning,  Our records indicate you missed your appointment on        02/15/10 [facility]                          It is very important that we reach you to reschedule this appointment. We look forward to participating in your health care needs. Please contact us at the number listed above at your earliest convenience to reschedule this appointment.     Sincerely,    Glass blower/designer

## 2010-03-23 NOTE — Cardiovascular Report (Signed)
Summary: Cardiac Catheterization  Cardiac Catheterization   Imported By: Dorise Hiss 11/10/2009 08:29:35  _____________________________________________________________________  External Attachment:    Type:   Image     Comment:   External Document

## 2010-03-23 NOTE — Assessment & Plan Note (Signed)
Summary: 1 YR FU APRIL REMINDER   Visit Type:  Follow-up Primary Provider:  Dr. 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 The patient has a history of Raynaud phenomena and systemic lupus erythematosus. She also had a pericardiotomy syndrome status post valve replacement. She is on Coumadin anticoagulation. She has been doing well denies any chest pain. she has no orthopnea or PND. She has rare palpitations.she denies any syncope.  Preventive Screening-Counseling & Management  Alcohol-Tobacco     Smoking Status: quit     Year Quit: 2008  Comments: Started smoking around 62 or 40 yrs old  Current Medications (verified): 1)  Folic Acid 1 Mg Tabs (Folic Acid) .... Take 1 Tablet By Mouth Once A Day 2)  Metoprolol Tartrate 25 Mg Tabs (Metoprolol Tartrate) .... Take 1/2 Tablet By Mouth Twice A Day 3)  Hydroxychloroquine Sulfate 200 Mg Tabs (Hydroxychloroquine Sulfate) .... Take 1 Tablet By Mouth Two Times A Day 4)  Furosemide 40 Mg Tabs (Furosemide) .... Take 1 Tablet By Mouth Once A Day 5)  Benazepril Hcl 5 Mg Tabs (Benazepril Hcl) .... Take 1 Tablet By Mouth Once A Day 6)  Warfarin Sodium 5 Mg Tabs (Warfarin Sodium) .... Use As Directed By Dr. Phillips Odor  Allergies (verified): No Known Drug Allergies  Comments:  Nurse/Medical Assistant: The patient's medications were reviewed with the patient and were updated in the Medication List. Pt brought a list of medications to office visit.  Cyril Loosen, RN, BSN (November 10, 2009 10:12 AM)  Past History:  Past Surgical History: Last updated: 12/06/2008 endometrial surgery Valve Replacement-Mitral (S/P)  Social History: Last updated: 12/06/2008 Full Time Married  Tobacco Use - Former.  Alcohol Use - no Regular Exercise - no Drug Use - no  Past  Medical History: Reviewed history from 12/06/2008 and no changes required. Raynaud phenomenon  lupus erythematosus Questionable hemolytic anemia   Review of Systems       The patient complains of palpitations, muscle weakness, and joint pain.  The patient denies fatigue, malaise, fever, weight gain/loss, vision loss, decreased hearing, hoarseness, chest pain, shortness of breath, prolonged cough, wheezing, sleep apnea, coughing up blood, abdominal pain, blood in stool, nausea, vomiting, diarrhea, heartburn, incontinence, blood in urine, leg swelling, rash, skin lesions, headache, fainting, dizziness, depression, anxiety, enlarged lymph nodes, easy bruising or bleeding, and environmental allergies.    Vital Signs:  Patient profile:   40 year old female Height:      64 inches Weight:      195.75 pounds BMI:     33.72 Pulse rate:   68 / minute BP sitting:   110 / 77  (left arm) Cuff size:   regular  Vitals Entered By: Cyril Loosen, RN, BSN (November 10, 2009 10:06 AM)  Nutrition Counseling: Patient's BMI is greater than 25 and therefore counseled on weight management options. Comments Follow up office visit. No cardiac complaints.   Physical Exam  Additional Exam:  General: Well-developed, well-nourished in no distress head: Normocephalic and atraumatic, butterfly rash eyes PERRLA/EOMI intact, conjunctiva and lids normal nose: No deformity or lesions mouth normal dentition, normal posterior pharynx neck: Supple, no JVD.  No masses, thyromegaly or abnormal cervical nodes lungs: Normal breath sounds bilaterally without wheezing.  Normal percussion heart: regular rate and rhythm with normal closing click of S1 and S2, no  S3 or S4.  PMI is normal.  No pathological murmurs abdomen: Normal bowel sounds, abdomen is soft and nontender without masses, organomegaly or hernias noted.  No hepatosplenomegaly musculoskeletal: Back normal, normal gait muscle strength and tone normal pulsus:  Pulse is normal in all 4 extremities Extremities: No peripheral pitting edema neurologic: Alert and oriented x 3 skin: Intact without lesions or rashes cervical nodes: No significant adenopathy psychologic: Normal affect    EKG  Procedure date:  11/10/2009  Findings:      normal sinus rhythm. No acute ischemic changes.  Impression & Recommendations:  Problem # 1:  MITRAL VALVE DISORDERS (ICD-424.0) normal functioning mitral valve prosthesis by physical examination. Continue Coumadin anticoagulation. The patient was instructed about SBE prophylaxis. Her updated medication list for this problem includes:    Metoprolol Tartrate 25 Mg Tabs (Metoprolol tartrate) .Marland Kitchen... Take 1/2 tablet by mouth twice a day    Furosemide 40 Mg Tabs (Furosemide) .Marland Kitchen... Take 1 tablet by mouth once a day    Benazepril Hcl 5 Mg Tabs (Benazepril hcl) .Marland Kitchen... Take 1 tablet by mouth once a day  Orders: EKG w/ Interpretation (93000)  Problem # 2:  SYSTEMIC LUPUS ERYTHEMATOSUS (ICD-710.0) the patient is followed in [place] by her rheumatologist. Her updated medication list for this problem includes:    Hydroxychloroquine Sulfate 200 Mg Tabs (Hydroxychloroquine sulfate) .Marland Kitchen... Take 1 tablet by mouth two times a day  Problem # 3:  COUMADIN THERAPY (ICD-V58.61) the patient is compliant with her Coumadin therapy. Her INR goal is between 2.5 and 3.5.  Patient Instructions: 1)  Your physician recommends that you continue on your current medications as directed. Please refer to the Current Medication list given to you today. 2)  Follow up in  1 year

## 2010-03-23 NOTE — Medication Information (Signed)
Summary: ccr-lovenox bridge-lr  Anticoagulant Therapy  Managed by: Vashti Hey, RN PCP: Dr. Assunta Found Supervising MD: Antoine Poche MD, Fayrene Fearing Indication 1: Mechanical Prosthetic Heart Valve (prevent systemic emboli) Valve Type: St Judes -- Mechanical Valve Position: Mitral Lab Used: [facility] [place] Site: [place] INR POC 2.9  Dietary changes: no    Health status changes: no    Bleeding/hemorrhagic complications: no    Recent/future hospitalizations: yes       Details: Pending port-a-cath placement on Monday 01/30/10  Any changes in medication regimen? yes       Details: will hold coumadin 5 days before procedure and bridge with Lovenox   Recent/future dental: no  Any missed doses?: no       Is patient compliant with meds? yes       Allergies: No Known Drug Allergies  Anticoagulation Management History:      The patient is taking warfarin and comes in today for a routine follow up visit.  Negative risk factors for bleeding include an age less than 30 years old.  The bleeding index is 'low risk'.  Negative CHADS2 values include Age > 51 years old.  Anticoagulation responsible provider: Antoine Poche MD, Fayrene Fearing.  INR POC: 2.9.  Cuvette Lot#: [id number].    Anticoagulation Management Assessment/Plan:      The patient's current anticoagulation dose is Warfarin sodium 5 mg tabs: Use as directed by Dr. Phillips Odor.  The target INR is 2.5-3.5.  The next INR is due 02/03/2010.  Anticoagulation instructions were given to patient.  Results were reviewed/authorized by Vashti Hey, RN.  She was notified by Vashti Hey RN.        Coagulation management information includes: Coumadin managed by Dr Phillips Odor.  Current Anticoagulation Instructions: INR 2.9 Pt will take last dose of coumadin on 01/24/10.  She will be bridged with lovenox 80mg  two times a day starting 12/8 pm dose.  After surgery on 12/12 she will resume coumadin and Lovenox until INR > 2.5   She will take coumadin 7mg  12/12, 10mg   12/13 & 12/14 then 7mg  on 12/15 and recheck INR on 12/16 Prescriptions: ENOXAPARIN SODIUM 80 MG/0.8ML SOLN (ENOXAPARIN SODIUM) Inject 80mg  subcutaneously twice daily  8am and 8pm  #10 x 1   Entered by:   Vashti Hey RN   Authorized by:   Lewayne Bunting, MD, [facility]   Signed by:   Vashti Hey RN on 01/24/2010   Method used:   Electronically to        [facility]  [address] (retail)       [address]       [place], [place]  [address]       Ph: [phone]       Fax: [phone]   RxID:   [id number]

## 2010-03-23 NOTE — Progress Notes (Signed)
Summary: lovenox bridging  Phone Note Call from Patient   Summary of Call: Patient now has surgical date set.  Scheduled for 12/12 at 7:30am and will need Lovenox bridging. Hoover Brunette, LPN  January 17, 2010 11:24 AM   Follow-up for Phone Call        Pt called and appt made for 01/24/10 at 2:00pm to give instructions for lovenox bridging.  Pt has self administered Lovenox before. Follow-up by: Vashti Hey RN,  January 18, 2010 1:38 PM

## 2010-03-23 NOTE — Medication Information (Signed)
Summary: ccr-lr  Anticoagulant Therapy  Managed by: Vashti Hey, RN PCP: Dr. Assunta Found Supervising MD: Andee Lineman MD, Michelle Piper Indication 1: Mechanical Prosthetic Heart Valve (prevent systemic emboli) Valve Type: St Judes -- Mechanical Valve Position: Mitral Lab Used: [facility] [place] Site: [place] INR POC 4.1  Dietary changes: no    Health status changes: yes       Details: dehydrated from chemo and had to go in for IV fluids  Bleeding/hemorrhagic complications: no    Recent/future hospitalizations: no    Any changes in medication regimen? no    Recent/future dental: no  Any missed doses?: no       Is patient compliant with meds? yes       Allergies: No Known Drug Allergies  Anticoagulation Management History:      The patient is taking warfarin and comes in today for a routine follow up visit.  Negative risk factors for bleeding include an age less than 40 years old.  The bleeding index is 'low risk'.  Negative CHADS2 values include Age > 47 years old.  Anticoagulation responsible Casilda Pickerill: Andee Lineman MD, Michelle Piper.  INR POC: 4.1.    Anticoagulation Management Assessment/Plan:      The patient's current anticoagulation dose is Warfarin sodium 5 mg tabs: Use as directed by Dr. Phillips Odor.  The target INR is 2.5-3.5.  The next INR is due 02/14/2010.  Anticoagulation instructions were given to patient.  Results were reviewed/authorized by Vashti Hey, RN.  She was notified by Vashti Hey RN.         Prior Anticoagulation Instructions: INR 2.9 Pt will take last dose of coumadin on 01/24/10.  She will be bridged with lovenox 80mg  two times a day starting 12/8 pm dose.  After surgery on 12/12 she will resume coumadin and Lovenox until INR > 2.5   She will take coumadin 7mg  12/12, 10mg  12/13 & 12/14 then 7mg  on 12/15 and recheck INR on 12/16  Current Anticoagulation Instructions: INR 4.1 Hold coumadin tonight then decrease dose to 5mg  once daily except 2.5mg  on M,W,F Has chemo on  02/14/10

## 2010-03-24 ENCOUNTER — Inpatient Hospital Stay (HOSPITAL_COMMUNITY)
Admission: AD | Admit: 2010-03-24 | Discharge: 2010-03-24 | Disposition: A | Discharge status: Home / Self Care | Payer: PRIVATE HEALTH INSURANCE | Source: Home / Self Care

## 2010-03-24 DIAGNOSIS — C50919 Malignant neoplasm of unspecified site of unspecified female breast: Secondary | ICD-10-CM

## 2010-03-24 LAB — CBC
HCT: 25.8 % — ABNORMAL LOW (ref 36.0–46.0)
MCH: 31.4 pg (ref 26.0–34.0)
MCV: 92.1 fL (ref 78.0–100.0)
Platelets: 214 10*3/uL (ref 150–400)
RDW: 18.2 % — ABNORMAL HIGH (ref 11.5–15.5)
WBC: 6.4 10*3/uL (ref 4.0–10.5)

## 2010-03-24 LAB — BASIC METABOLIC PANEL
BUN: 12 mg/dL (ref 6–23)
CO2: 23 mEq/L (ref 19–32)
Chloride: 102 mEq/L (ref 96–112)
Creatinine, Ser: 0.92 mg/dL (ref 0.4–1.2)
Glucose, Bld: 84 mg/dL (ref 70–99)
Potassium: 3.9 mEq/L (ref 3.5–5.1)

## 2010-03-24 LAB — DIFFERENTIAL
Eosinophils Absolute: 0 10*3/uL (ref 0.0–0.7)
Eosinophils Relative: 0 % (ref 0–5)
Lymphocytes Relative: 28 % (ref 12–46)
Lymphs Abs: 1.8 10*3/uL (ref 0.7–4.0)
Monocytes Absolute: 0.5 10*3/uL (ref 0.1–1.0)
Monocytes Relative: 8 % (ref 3–12)

## 2010-03-25 LAB — DIFFERENTIAL
Basophils Absolute: 0.3 10*3/uL — ABNORMAL HIGH (ref 0.0–0.1)
Basophils Relative: 5 % — ABNORMAL HIGH (ref 0–1)
Eosinophils Absolute: 0 10*3/uL (ref 0.0–0.7)
Lymphocytes Relative: 36 % (ref 12–46)
Lymphs Abs: 2.3 10*3/uL (ref 0.7–4.0)
Neutro Abs: 3.1 10*3/uL (ref 1.7–7.7)

## 2010-03-25 LAB — CBC
HCT: 26 % — ABNORMAL LOW (ref 36.0–46.0)
Hemoglobin: 8.8 g/dL — ABNORMAL LOW (ref 12.0–15.0)
MCH: 31.1 pg (ref 26.0–34.0)
MCHC: 33.8 g/dL (ref 30.0–36.0)
RDW: 18.2 % — ABNORMAL HIGH (ref 11.5–15.5)

## 2010-03-25 LAB — URINE CULTURE
Colony Count: NO GROWTH
Culture: NO GROWTH

## 2010-03-25 LAB — BASIC METABOLIC PANEL
CO2: 25 mEq/L (ref 19–32)
Calcium: 8.3 mg/dL — ABNORMAL LOW (ref 8.4–10.5)
Creatinine, Ser: 0.85 mg/dL (ref 0.4–1.2)
GFR calc Af Amer: >60 mL/min (ref >60)
GFR calc non Af Amer: >60 mL/min (ref >60)
Glucose, Bld: 106 mg/dL — ABNORMAL HIGH (ref 70–99)

## 2010-03-26 ENCOUNTER — Inpatient Hospital Stay (HOSPITAL_COMMUNITY): Payer: PRIVATE HEALTH INSURANCE

## 2010-03-26 ENCOUNTER — Encounter (HOSPITAL_COMMUNITY): Payer: Self-pay

## 2010-03-26 LAB — DIFFERENTIAL
Basophils Absolute: 0.3 10*3/uL — ABNORMAL HIGH (ref 0.0–0.1)
Basophils Relative: 4 % — ABNORMAL HIGH (ref 0–1)
Eosinophils Absolute: 0 10*3/uL (ref 0.0–0.7)
Eosinophils Relative: 0 % (ref 0–5)
Neutrophils Relative %: 44 % (ref 43–77)

## 2010-03-26 LAB — URINE CULTURE
Culture  Setup Time: 201202040140
Special Requests: NEGATIVE

## 2010-03-26 LAB — CBC
MCV: 92.2 fL (ref 78.0–100.0)
Platelets: 195 10*3/uL (ref 150–400)
RDW: 18.2 % — ABNORMAL HIGH (ref 11.5–15.5)
WBC: 6.4 10*3/uL (ref 4.0–10.5)

## 2010-03-26 LAB — PROTIME-INR
INR: 2.38 — ABNORMAL HIGH (ref 0.00–1.49)
Prothrombin Time: 26.1 seconds — ABNORMAL HIGH (ref 11.6–15.2)

## 2010-03-26 MED ORDER — IOHEXOL 300 MG/ML  SOLN
80.0000 mg | Freq: Once | INTRAMUSCULAR | Status: AC | PRN
  Administered 2010-03-26: 0.27 mL via INTRAVENOUS

## 2010-03-27 DIAGNOSIS — C50919 Malignant neoplasm of unspecified site of unspecified female breast: Secondary | ICD-10-CM

## 2010-03-27 LAB — CBC
MCH: 31 pg (ref 26.0–34.0)
MCV: 91.8 fL (ref 78.0–100.0)
Platelets: 155 10*3/uL (ref 150–400)
RDW: 18.3 % — ABNORMAL HIGH (ref 11.5–15.5)
WBC: 6.2 10*3/uL (ref 4.0–10.5)

## 2010-03-27 LAB — BASIC METABOLIC PANEL
CO2: 22 mEq/L (ref 19–32)
Calcium: 8 mg/dL — ABNORMAL LOW (ref 8.4–10.5)
Creatinine, Ser: 0.6 mg/dL (ref 0.4–1.2)
GFR calc Af Amer: >60 mL/min (ref >60)
Glucose, Bld: 89 mg/dL (ref 70–99)

## 2010-03-27 LAB — DIFFERENTIAL
Eosinophils Absolute: 0 10*3/uL (ref 0.0–0.7)
Eosinophils Relative: 1 % (ref 0–5)
Lymphs Abs: 2.6 10*3/uL (ref 0.7–4.0)

## 2010-03-27 NOTE — Consult Note (Addendum)
[name], [name] NO.:  [id number]  MEDICAL RECORD NO.:  [id number]          PATIENT TYPE:  INP  LOCATION:  A327                          FACILITY:  [facility]  PHYSICIAN:  Gerrit Friends. Dietrich Pates, MD, FACCDATE OF BIRTH:  [birth year]  DATE OF CONSULTATION:  03/21/2010 DATE OF DISCHARGE:                                CONSULTATION   PRIMARY CARDIOLOGIST:  Learta Codding, MD, [facility], in the [facility].  PRIMARY CARE PHYSICIAN:  Dr. Phillips Odor.  ONCOLOGIST:  Ladona Horns. Mariel Sleet, MD  REQUESTING PHYSICIAN:  [facility].  REASON FOR CONSULTATION:  Questionable need for TEE, rule out myocarditis.  HISTORY OF PRESENT ILLNESS:  This is a 40 year old Caucasian female who was admitted on March 10, 2010, with an infected Port-A-Cath with history of breast CA status post chemotherapy and radiation.  The patient has also a St. Jude mitral valve placed via physicians at [facility] in 2008.  The patient has been on chemotherapy since December of 2011, and has been followed by Dr. Mariel Sleet for this.  The patient was recently admitted to [facility] in December of 2011, after having septic shock in the setting of cholecystitis.  She was placed on multiple antibiotics and subsequently had a lap chole at [facility].  This was completed on February 22, 2010.  The patient was seen on consultation by [facility] at that time.  Dr. Valera Castle on February 11, 2010, in the setting of bradycardia and hypotension.  The patient was placed on dopamine at that time and the patient was also started on IV Lasix for a brief period of time.  The patient did become hemodynamically stable and actually went home.  The patient re-presented to the hospital after having a Port-A-Cath check 1-week status post chemotherapy, was found that the patient had pus and brownish material coming out of the Port-A-Cath and was sent over to [facility] where she is  being seen and evaluated by Dr. Mariel Sleet in the [facility].  The patient also was noted to have two flocculant bumps, one on the left side of her belly and one on the midportion of her belly with pain.  She was also started on antibiotics for this.  She was initially seen by Dr. Mariel Sleet, where she was placed on vancomycin, she was also placed on Zosyn.  She was pancytopenic and was placed on Neupogen for short period of time as well.  The patient subsequently had removal of her Port-A-Cath which was completed on March 11, 2010, followed by the surgeons for an open wound at that time where she was being continually monitored for any evidence of re-infection.  The patient also had blood cultures completed which were found to be negative x4.    The patient had been doing well,but suddenly had some complaints of nausea  and vomiting and diarrhea.The patient was found to be C. diff positive and  placed on Flagyl.  Thepatient was having labile fevers and did spike a  temperature on January30, 2012, of 102.7 despite negative blood cultures which apparently normalized and then re-occurred with  an elevated temperature of 102.1. The patient is currently afebrile, but we are asked to evaluate the patient for  myocarditis in the setting of St. Jude mitral valve.  The patient is immunosuppressed secondary to chemotherapy.  She had also been started on some steroids prior to this admission where she was seen at [facility] at that time.  The patient is not complaining of any further pain, but she is having some continued nausea and vomiting and generalized weakness.  The patient was also complaining of just soreness at the former Port-A-Cath site.  Her fevers are labile, although she is comfortable.  The patient did have an echocardiogram dated, March 17, 2010, which was negative for vegetation on the mitral valve with a normal EF of 55- 60%.  We are asked for further  evaluation.  REVIEW OF SYSTEMS:  Fever, chills, generalized fatigue, nausea, and vomiting with prior infection and erythema over the right Port-A-Cath site prior to removal.  All other systems were reviewed and are found to be negative.  PAST MEDICAL HISTORY: 1. 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 2. Systemic lupus. 3. Recent diagnosis in December of 2011, status post chemotherapy and     radiation which began in December. 4. History of diastolic dysfunction. 5. Thrombocytopenia. 6. Pancytopenia. 7. History of Raynaud disease. 8. Hypothyroidism.  SOCIAL HISTORY:  She lives in [place] with her husband.  She is disabled.  She is a former tobacco user, negative EtOH or drug use.  FAMILY HISTORY:  Mother is in good health.  Father with hypertension.  Current medications prior to admission: 1. Percocet 5/325 one p.o. q.6 hours p.r.n. 2. Plaquenil 200 mg b.i.d. 3. Synthroid 50 mcg daily. 4. Potassium 20 mEq daily. 5. Motrin 800 mg b.i.d. p.r.n. 6. Scopolamine patch p.r.n. 7. Ativan 1 mg q.6 hours p.r.n. 8. Cyclobenzaprine b.i.d. p.r.n. for muscle spasms. 9. Lidoderm topical cream prior to chemotherapy. 10.Warfarin 5 mg Mondays, Wednesdays, Fridays, and 2.5 on other days. 11.Cyanocobalamin 1000 mg injections q.3 months. 12.Zovirax.  ALLERGIES:  No known drug allergies.  CURRENT LABORATORY DATA:  Sodium 131, potassium 4.1, chloride 100, CO2 of 23, BUN 2, creatinine 0.87, glucose 99, total bili 0.3, alkaline phosphatase 72, AST 18, ALT 11, total protein 7.6, albumin 2.7.  CRP elevated at 1.3.  EKG sinus tachycardia, rate of 101 beats per minute with T-wave inversion in V3 and V4 with flattening inferiorly.  RADIOLOGY:  CT of the chest revealing tiny bilateral effusions and minimal basilar atelectasis with interval decrease in size of opacity at the prior breast biopsies on  the left breast with an open wound noted superior to the right breast.  CT of the abdomen, nonspecific free pelvic fluid, small residual fluid collection at the gallbladder fossa, it represents sterile or infected postoperative collection, patches of bilateral nephrogram suggesting pyelonephritis, recommend correlation with urinalysis.  Echocardiogram, dated March 17, 2010, demonstrating mild concentric hypertrophy, systolic function normal with the estimated EF of 55-60%. Wall motion with normal with no regional wall motion abnormalities, mitral valve mechanical prosthesis was present and functioning normally, the prosthesis had normal range of motion, the sewing ring appeared normal, mild regurg.  PHYSICAL EXAMINATION:  VITAL SIGNS:  Blood pressure 108/74, pulse 101, respirations 19, temperature 98.3, T-max 102.7, O2 sat 100% on room air, weight 79 kg. GENERAL:  She is awake, alert, and oriented, listless, but  responsive. HEENT:  Head is normocephalic and atraumatic.  She is wearing a wig. NECK:  Supple without thyromegaly, carotid bruits, or JVD noted. CARDIOVASCULAR:  Tachycardic with prosthetic click.  No rubs are noted or extra heart sounds.  Recheck in the left lateral position revealing no rubs or crackles. LUNGS:  Clear to auscultation without wheezes, rales, or rhonchi. SKIN:  Warm with bruises on the abdomen. ABDOMEN:  Mildly tender with 2+ bowel sounds.  No guarding is noted. EXTREMITIES:  Without clubbing, cyanosis, edema, or splinter hemorrhages. MUSCULOSKELETAL:  No joint deformity.  She does have a dressing over the right upper chest which does not appear to be draining or oozing. NEUROLOGIC:  Cranial nerves II-XII are grossly intact.  IMPRESSION: 1. St. Jude mitral valve placed in 2008, at [facility] continued     febrile state on multiple antibiotics.  Blood cultures are     negative. Echo dated, March 17, 2010, did not reveal vegetation     at that  time.  TEE to be planned per Dr. Dietrich Pates once he sees and     evaluates the patient for need to do so and continued febrile state     with a prosthetic valve. 2. Status post infected Port-A-Cath removed on March 11, 2009, on     vancomycin and Zosyn.  Blood cultures were found to be negative x4.     She is now off of antibiotics.  She had a fever of 102.7 on March 20, 2010, with recurrent fever of 102.1 at 2 a.m. today.  She is     now currently afebrile off antibiotics. 3. Nausea and vomiting, resolved after Flagyl discontinued.  She was     placed on this for C. diff.  PLAN:  This is a 40 year old Caucasian female with multiple medical problems admitted with infected Port-A-Cath which was removed the day after admission.  We are asked to see the patient to rule out myocarditis with continuing fever with known history of prosthetic St. Jude mitral valve with negative blood cultures.  The patient's exam does not reveal any rubs or crackles either anteriorly or on the left lateral auscultation.  The patient was not found to have any splinter hemorrhages, however, she will be reevaluated per Dr. Dietrich Pates on his examination and a decision will be made to proceed with TEE to rule out vegetation or myocarditis in the setting.  More recommendations per Dr. Dietrich Pates, please see is attached note.  On behalf of the physicians and providers of [employer], we would like to thank the [facility] and Dr. Mariel Sleet for allowing [place] to participate in the care of this patient.     Bettey Mare. Lyman Bishop, NP   ______________________________ Gerrit Friends. Dietrich Pates, MD, [facility]    KML/MEDQ  D:  03/21/2010  T:  03/22/2010  Job:  [id number]  cc:   Learta Codding, MD,FACC [address] [place], [place] [address]  Dr. Herbie Saxon S. Mariel Sleet, MD Fax: [phone]  Electronically Signed by Joni Reining NP on 03/23/2010 04:48:13 PM Electronically Signed by Viola Bing  MD [facility] on 03/27/2010 08:19:26 AM

## 2010-03-28 ENCOUNTER — Encounter (HOSPITAL_COMMUNITY): Payer: Self-pay

## 2010-03-28 ENCOUNTER — Inpatient Hospital Stay (HOSPITAL_COMMUNITY): Payer: PRIVATE HEALTH INSURANCE

## 2010-03-28 LAB — DIFFERENTIAL
Basophils Absolute: 0.2 10*3/uL — ABNORMAL HIGH (ref 0.0–0.1)
Lymphocytes Relative: 51 % — ABNORMAL HIGH (ref 12–46)
Lymphs Abs: 2.9 10*3/uL (ref 0.7–4.0)
Monocytes Absolute: 0.5 10*3/uL (ref 0.1–1.0)
Neutro Abs: 2 10*3/uL (ref 1.7–7.7)

## 2010-03-28 LAB — CBC
HCT: 22.6 % — ABNORMAL LOW (ref 36.0–46.0)
Hemoglobin: 7.6 g/dL — ABNORMAL LOW (ref 12.0–15.0)
MCHC: 33.6 g/dL (ref 30.0–36.0)
MCV: 92.2 fL (ref 78.0–100.0)
WBC: 5.7 10*3/uL (ref 4.0–10.5)

## 2010-03-28 LAB — BASIC METABOLIC PANEL
Chloride: 105 mEq/L (ref 96–112)
GFR calc non Af Amer: >60 mL/min (ref >60)
Glucose, Bld: 86 mg/dL (ref 70–99)
Potassium: 3.3 mEq/L — ABNORMAL LOW (ref 3.5–5.1)
Sodium: 135 mEq/L (ref 135–145)

## 2010-03-28 LAB — URINE CULTURE
Culture  Setup Time: 201202052255
Special Requests: POSITIVE

## 2010-03-28 LAB — PROTIME-INR
INR: 1.36 (ref 0.00–1.49)
Prothrombin Time: 17 seconds — ABNORMAL HIGH (ref 11.6–15.2)

## 2010-03-29 DIAGNOSIS — C50919 Malignant neoplasm of unspecified site of unspecified female breast: Secondary | ICD-10-CM

## 2010-03-29 LAB — PREPARE FRESH FROZEN PLASMA: Unit division: 0

## 2010-03-29 LAB — CBC
MCV: 92.2 fL (ref 78.0–100.0)
Platelets: 156 10*3/uL (ref 150–400)
RDW: 18.6 % — ABNORMAL HIGH (ref 11.5–15.5)
WBC: 6.9 10*3/uL (ref 4.0–10.5)

## 2010-03-29 LAB — PROTIME-INR
INR: 1.23 (ref 0.00–1.49)
Prothrombin Time: 15.7 seconds — ABNORMAL HIGH (ref 11.6–15.2)

## 2010-03-29 LAB — DIFFERENTIAL
Basophils Absolute: 0.3 10*3/uL — ABNORMAL HIGH (ref 0.0–0.1)
Basophils Relative: 4 % — ABNORMAL HIGH (ref 0–1)
Eosinophils Absolute: 0 10*3/uL (ref 0.0–0.7)
Eosinophils Relative: 1 % (ref 0–5)

## 2010-03-30 DIAGNOSIS — C50919 Malignant neoplasm of unspecified site of unspecified female breast: Secondary | ICD-10-CM

## 2010-03-30 LAB — BASIC METABOLIC PANEL
CO2: 21 mEq/L (ref 19–32)
Calcium: 7.7 mg/dL — ABNORMAL LOW (ref 8.4–10.5)
Chloride: 104 mEq/L (ref 96–112)
Glucose, Bld: 81 mg/dL (ref 70–99)
Potassium: 3.7 mEq/L (ref 3.5–5.1)
Sodium: 132 mEq/L — ABNORMAL LOW (ref 135–145)

## 2010-03-30 LAB — CBC
Hemoglobin: 8.1 g/dL — ABNORMAL LOW (ref 12.0–15.0)
MCH: 30.2 pg (ref 26.0–34.0)
MCHC: 32.8 g/dL (ref 30.0–36.0)

## 2010-03-30 LAB — DIFFERENTIAL
Basophils Relative: 4 % — ABNORMAL HIGH (ref 0–1)
Monocytes Absolute: 0.4 10*3/uL (ref 0.1–1.0)
Monocytes Relative: 6 % (ref 3–12)
Neutro Abs: 2.1 10*3/uL (ref 1.7–7.7)

## 2010-03-30 LAB — CULTURE, BLOOD (SINGLE)

## 2010-03-30 LAB — PROTIME-INR: Prothrombin Time: 17.1 seconds — ABNORMAL HIGH (ref 11.6–15.2)

## 2010-03-31 LAB — CBC
MCHC: 33.1 g/dL (ref 30.0–36.0)
RDW: 18.6 % — ABNORMAL HIGH (ref 11.5–15.5)

## 2010-03-31 LAB — DIFFERENTIAL
Basophils Absolute: 0.3 10*3/uL — ABNORMAL HIGH (ref 0.0–0.1)
Basophils Relative: 4 % — ABNORMAL HIGH (ref 0–1)
Eosinophils Relative: 0 % (ref 0–5)
Monocytes Absolute: 0 10*3/uL — ABNORMAL LOW (ref 0.1–1.0)

## 2010-03-31 LAB — PROTIME-INR: INR: 1.57 — ABNORMAL HIGH (ref 0.00–1.49)

## 2010-03-31 LAB — CLOSTRIDIUM DIFFICILE BY PCR: Toxigenic C. Difficile by PCR: NEGATIVE

## 2010-04-01 LAB — BODY FLUID CULTURE: Culture: NO GROWTH

## 2010-04-01 LAB — DIFFERENTIAL
Basophils Relative: 3 % — ABNORMAL HIGH (ref 0–1)
Eosinophils Absolute: 0.1 10*3/uL (ref 0.0–0.7)
Monocytes Relative: 8 % (ref 3–12)
Neutrophils Relative %: 33 % — ABNORMAL LOW (ref 43–77)

## 2010-04-01 LAB — PROTIME-INR: Prothrombin Time: 24.3 seconds — ABNORMAL HIGH (ref 11.6–15.2)

## 2010-04-01 LAB — CBC
MCH: 31 pg (ref 26.0–34.0)
Platelets: 133 10*3/uL — ABNORMAL LOW (ref 150–400)
RBC: 2.94 MIL/uL — ABNORMAL LOW (ref 3.87–5.11)
RDW: 18.7 % — ABNORMAL HIGH (ref 11.5–15.5)
WBC: 6.1 10*3/uL (ref 4.0–10.5)

## 2010-04-04 ENCOUNTER — Encounter: Payer: Self-pay | Admitting: Cardiology

## 2010-04-04 LAB — CULTURE, BLOOD (ROUTINE X 2)

## 2010-04-05 ENCOUNTER — Encounter: Payer: Self-pay | Admitting: Cardiology

## 2010-04-05 LAB — CONVERTED CEMR LAB: Prothrombin Time: 21 s

## 2010-04-06 ENCOUNTER — Encounter: Payer: Self-pay | Admitting: Cardiology

## 2010-04-07 ENCOUNTER — Encounter: Payer: Self-pay | Admitting: Cardiology

## 2010-04-07 ENCOUNTER — Telehealth: Payer: Self-pay | Admitting: Cardiology

## 2010-04-10 ENCOUNTER — Inpatient Hospital Stay (HOSPITAL_COMMUNITY)
Admission: AD | Admit: 2010-04-10 | Discharge: 2010-04-29 | DRG: 041 | Disposition: A | Discharge status: Home Health | Payer: PRIVATE HEALTH INSURANCE | Source: Other Acute Inpatient Hospital | Attending: Internal Medicine | Admitting: Internal Medicine

## 2010-04-10 ENCOUNTER — Inpatient Hospital Stay (HOSPITAL_COMMUNITY): Admission: RE | Admit: 2010-04-10 | Payer: PRIVATE HEALTH INSURANCE | Source: Ambulatory Visit

## 2010-04-10 ENCOUNTER — Ambulatory Visit (HOSPITAL_COMMUNITY): Payer: PRIVATE HEALTH INSURANCE | Admitting: Oncology

## 2010-04-10 ENCOUNTER — Encounter: Payer: Self-pay | Admitting: Cardiology

## 2010-04-10 ENCOUNTER — Other Ambulatory Visit (HOSPITAL_COMMUNITY): Payer: Self-pay | Admitting: Oncology

## 2010-04-10 DIAGNOSIS — D591 Autoimmune hemolytic anemia, unspecified: Secondary | ICD-10-CM | POA: Diagnosis present

## 2010-04-10 DIAGNOSIS — C50919 Malignant neoplasm of unspecified site of unspecified female breast: Secondary | ICD-10-CM

## 2010-04-10 DIAGNOSIS — K219 Gastro-esophageal reflux disease without esophagitis: Secondary | ICD-10-CM | POA: Diagnosis present

## 2010-04-10 DIAGNOSIS — M329 Systemic lupus erythematosus, unspecified: Secondary | ICD-10-CM | POA: Diagnosis present

## 2010-04-10 DIAGNOSIS — K5900 Constipation, unspecified: Secondary | ICD-10-CM | POA: Diagnosis present

## 2010-04-10 DIAGNOSIS — E669 Obesity, unspecified: Secondary | ICD-10-CM | POA: Diagnosis present

## 2010-04-10 DIAGNOSIS — E039 Hypothyroidism, unspecified: Secondary | ICD-10-CM | POA: Diagnosis present

## 2010-04-10 DIAGNOSIS — T451X5A Adverse effect of antineoplastic and immunosuppressive drugs, initial encounter: Secondary | ICD-10-CM | POA: Diagnosis present

## 2010-04-10 DIAGNOSIS — Z954 Presence of other heart-valve replacement: Secondary | ICD-10-CM

## 2010-04-10 DIAGNOSIS — R509 Fever, unspecified: Secondary | ICD-10-CM | POA: Diagnosis present

## 2010-04-10 DIAGNOSIS — Z7901 Long term (current) use of anticoagulants: Secondary | ICD-10-CM

## 2010-04-10 DIAGNOSIS — R200 Anesthesia of skin: Secondary | ICD-10-CM

## 2010-04-10 DIAGNOSIS — N390 Urinary tract infection, site not specified: Secondary | ICD-10-CM | POA: Diagnosis not present

## 2010-04-10 DIAGNOSIS — I7300 Raynaud's syndrome without gangrene: Secondary | ICD-10-CM | POA: Diagnosis present

## 2010-04-10 DIAGNOSIS — G589 Mononeuropathy, unspecified: Secondary | ICD-10-CM | POA: Diagnosis present

## 2010-04-10 DIAGNOSIS — G5790 Unspecified mononeuropathy of unspecified lower limb: Principal | ICD-10-CM | POA: Diagnosis present

## 2010-04-10 DIAGNOSIS — G610 Guillain-Barre syndrome: Secondary | ICD-10-CM

## 2010-04-10 LAB — CBC
HCT: 29.6 % — ABNORMAL LOW (ref 36.0–46.0)
MCH: 30.1 pg (ref 26.0–34.0)
MCHC: 31.8 g/dL (ref 30.0–36.0)
RDW: 18.2 % — ABNORMAL HIGH (ref 11.5–15.5)

## 2010-04-10 LAB — BASIC METABOLIC PANEL
BUN: 8 mg/dL (ref 6–23)
CO2: 25 mEq/L (ref 19–32)
Chloride: 105 mEq/L (ref 96–112)
Creatinine, Ser: 0.72 mg/dL (ref 0.4–1.2)
Glucose, Bld: 133 mg/dL — ABNORMAL HIGH (ref 70–99)

## 2010-04-10 LAB — MRSA PCR SCREENING: MRSA by PCR: NEGATIVE

## 2010-04-10 LAB — CULTURE, BLOOD (ROUTINE X 2)

## 2010-04-10 LAB — PROTIME-INR: INR: 2.04 — ABNORMAL HIGH (ref 0.00–1.49)

## 2010-04-11 ENCOUNTER — Inpatient Hospital Stay (HOSPITAL_COMMUNITY): Payer: PRIVATE HEALTH INSURANCE

## 2010-04-11 DIAGNOSIS — R509 Fever, unspecified: Secondary | ICD-10-CM

## 2010-04-11 DIAGNOSIS — G589 Mononeuropathy, unspecified: Secondary | ICD-10-CM

## 2010-04-11 LAB — URINALYSIS, ROUTINE W REFLEX MICROSCOPIC
Bilirubin Urine: NEGATIVE
Hgb urine dipstick: NEGATIVE
Protein, ur: NEGATIVE mg/dL
Urine Glucose, Fasting: NEGATIVE mg/dL
Urobilinogen, UA: 0.2 mg/dL (ref 0.0–1.0)

## 2010-04-11 LAB — COMPREHENSIVE METABOLIC PANEL
AST: 28 U/L (ref 0–37)
Albumin: 2.6 g/dL — ABNORMAL LOW (ref 3.5–5.2)
Alkaline Phosphatase: 91 U/L (ref 39–117)
Chloride: 104 mEq/L (ref 96–112)
Creatinine, Ser: 0.72 mg/dL (ref 0.4–1.2)
GFR calc Af Amer: >60 mL/min (ref >60)
Potassium: 3.7 mEq/L (ref 3.5–5.1)
Total Bilirubin: 0.6 mg/dL (ref 0.3–1.2)
Total Protein: 6 g/dL (ref 6.0–8.3)

## 2010-04-11 LAB — CD4/CD8 (T-HELPER/T-SUPPRESSOR CELL)
CD4 absolute: 210 /uL — ABNORMAL LOW (ref 500–1900)
CD4%: 7 % — ABNORMAL LOW (ref 30.0–60.0)
CD8 T Cell Abs: 2220 /uL — ABNORMAL HIGH (ref 230–1000)
Ratio: 0.1 — ABNORMAL LOW (ref 1.0–3.0)

## 2010-04-11 LAB — URINE MICROSCOPIC-ADD ON

## 2010-04-11 LAB — CBC
HCT: 28.2 % — ABNORMAL LOW (ref 36.0–46.0)
MCH: 29.7 pg (ref 26.0–34.0)
MCHC: 31.2 g/dL (ref 30.0–36.0)
MCV: 95.3 fL (ref 78.0–100.0)
RDW: 18.4 % — ABNORMAL HIGH (ref 11.5–15.5)

## 2010-04-11 LAB — CK: Total CK: 17 U/L (ref 7–177)

## 2010-04-11 LAB — HIV ANTIBODY (ROUTINE TESTING W REFLEX): HIV: NONREACTIVE

## 2010-04-11 MED ORDER — GADOBENATE DIMEGLUMINE 529 MG/ML IV SOLN
15.0000 mL | Freq: Once | INTRAVENOUS | Status: AC
  Administered 2010-04-11: 15 mL via INTRAVENOUS

## 2010-04-12 ENCOUNTER — Encounter: Payer: Self-pay | Admitting: Cardiology

## 2010-04-12 LAB — DIFFERENTIAL
Eosinophils Relative: 5 % (ref 0–5)
Lymphocytes Relative: 65 % — ABNORMAL HIGH (ref 12–46)
Lymphs Abs: 3.7 10*3/uL (ref 0.7–4.0)

## 2010-04-12 LAB — COMPREHENSIVE METABOLIC PANEL
ALT: 12 U/L (ref 0–35)
AST: 23 U/L (ref 0–37)
Alkaline Phosphatase: 80 U/L (ref 39–117)
CO2: 26 mEq/L (ref 19–32)
Chloride: 105 mEq/L (ref 96–112)
Creatinine, Ser: 0.71 mg/dL (ref 0.4–1.2)
GFR calc Af Amer: >60 mL/min (ref >60)
GFR calc non Af Amer: >60 mL/min (ref >60)
Potassium: 3.7 mEq/L (ref 3.5–5.1)
Sodium: 139 mEq/L (ref 135–145)
Total Bilirubin: 0.4 mg/dL (ref 0.3–1.2)

## 2010-04-12 LAB — CBC
HCT: 26.7 % — ABNORMAL LOW (ref 36.0–46.0)
Hemoglobin: 8.3 g/dL — ABNORMAL LOW (ref 12.0–15.0)
MCV: 95 fL (ref 78.0–100.0)
RDW: 18.3 % — ABNORMAL HIGH (ref 11.5–15.5)
WBC: 5.7 10*3/uL (ref 4.0–10.5)

## 2010-04-12 NOTE — Progress Notes (Signed)
Summary: coumadin management  Phone Note Other Incoming   Caller: Addison Lank RN [facility] Reason for Call: Discuss lab or test results Summary of Call: Called with results of PT/INR obtained on pt today.  PT 25.2  INR 2.1  PICC line was pulled 04/05/09 and IV mycamine was discontinued.  Order given for pt to take coumadin 7.5mg  x 3 and recheck INR on 04/10/10. Initial call taken by: Vashti Hey RN,  April 07, 2010 11:29 AM     Anticoagulant Therapy  Managed by: Vashti Hey, RN PCP: Dr. Assunta Found Supervising MD: Myrtis Ser MD, Tinnie Gens Indication 1: Mechanical Prosthetic Heart Valve (prevent systemic emboli) Valve Type: St Judes -- Mechanical Valve Position: Mitral Lab Used: [facility] [place] Site: [place] PT 25.2 INR POC 2.1  Dietary changes: no    Health status changes: yes       Details: PICC line pulled 04/05/10  Bleeding/hemorrhagic complications: no    Recent/future hospitalizations: no    Any changes in medication regimen? yes       Details: IV mycamine d/c'd 04/05/10  Recent/future dental: no  Any missed doses?: no       Is patient compliant with meds? yes         Anticoagulation Management History:      Her anticoagulation is being managed by telephone today.  Negative risk factors for bleeding include an age less than 71 years old.  The bleeding index is 'low risk'.  Negative CHADS2 values include Age > 22 years old.  Prothrombin time is 25.2.  Anticoagulation responsible provider: Myrtis Ser MD, Tinnie Gens.  INR POC: 2.1.    Anticoagulation Management Assessment/Plan:      The patient's current anticoagulation dose is Warfarin sodium 5 mg tabs: Use as directed by Dr. Phillips Odor.  The target INR is 2.5-3.5.  The next INR is due 04/10/2010.  Anticoagulation instructions were given to Addison Lank RN [facility].  Results were reviewed/authorized by Vashti Hey, RN.  She was notified by Addison Lank RN [facility].         Prior Anticoagulation Instructions: INR 1.7 Take coumadin 7.5mg  once  daily and recheck INR 04/07/10 Home Health will not be seeing pt till 5-6pm Call me at home with results  Current Anticoagulation Instructions: INR 2.1 Called with results of PT/INR obtained on pt today.  PT 25.2  INR 2.1  PICC line was pulled 04/05/09 and IV mycamine was discontinued.  Order given for pt to take coumadin 7.5mg  x 3 and recheck INR on 04/10/10.

## 2010-04-12 NOTE — Medication Information (Signed)
Summary: Coumadin Clinic  Anticoagulant Therapy  Managed by: Vashti Hey, RN PCP: Dr. Assunta Found Supervising MD: Diona Browner MD, Remi Deter Indication 1: Mechanical Prosthetic Heart Valve (prevent systemic emboli) Valve Type: St Judes -- Mechanical Valve Position: Mitral Lab Used: [facility] [place] Site: [place] PT 21.0 INR POC 1.7  Dietary changes: no    Health status changes: yes       Details: Developed fungal infection at port-a-cath site.  Port was removed and PICC line inserted  Bleeding/hemorrhagic complications: no    Recent/future hospitalizations: yes       Details: post hospital   Any changes in medication regimen? yes       Details: Pt is at home on IV mycamine antifungal with home health assistance  Recent/future dental: no  Any missed doses?: no       Is patient compliant with meds? yes       Allergies: No Known Drug Allergies  Anticoagulation Management History:      Her anticoagulation is being managed by telephone today.  Negative risk factors for bleeding include an age less than 53 years old.  The bleeding index is 'low risk'.  Negative CHADS2 values include Age > 62 years old.  Prothrombin time is 21.0.  Anticoagulation responsible provider: Diona Browner MD, Remi Deter.  INR POC: 1.7.    Anticoagulation Management Assessment/Plan:      The patient's current anticoagulation dose is Warfarin sodium 5 mg tabs: Use as directed by Dr. Phillips Odor.  The target INR is 2.5-3.5.  The next INR is due 04/07/2010.  Anticoagulation instructions were given to patient.  Results were reviewed/authorized by Vashti Hey, RN.  She was notified by Vashti Hey RN.        Coagulation management information includes: 04/03/10 INR 4.6  PT 55.3  Hold coumadin x 2 per [facility]  pt was on 7.5mg  alternating 10mg  of coumadin.  Prior Anticoagulation Instructions: INR 1.1 Denies missing doses. Discussed wih Dr Andee Lineman Take coumadin 10mg  tonight then increase to 7mg  once daily    Current Anticoagulation Instructions: INR 1.7 Take coumadin 7.5mg  once daily and recheck INR 04/07/10 Home Health will not be seeing pt till 5-6pm Call me at home with results

## 2010-04-12 NOTE — Consult Note (Signed)
Summary: [facility] Consultation Report  [facility] Consultation Report   Imported By: Earl Many 03/29/2010 09:10:33  _____________________________________________________________________  External Attachment:    Type:   Image     Comment:   External Document

## 2010-04-13 LAB — DIFFERENTIAL
Lymphocytes Relative: 65 % — ABNORMAL HIGH (ref 12–46)
Monocytes Absolute: 0.4 10*3/uL (ref 0.1–1.0)
Monocytes Relative: 7 % (ref 3–12)
Neutro Abs: 1.1 10*3/uL — ABNORMAL LOW (ref 1.7–7.7)
Neutrophils Relative %: 19 % — ABNORMAL LOW (ref 43–77)

## 2010-04-13 LAB — RENAL FUNCTION PANEL
Albumin: 2.2 g/dL — ABNORMAL LOW (ref 3.5–5.2)
BUN: 8 mg/dL (ref 6–23)
Chloride: 105 mEq/L (ref 96–112)
Glucose, Bld: 86 mg/dL (ref 70–99)
Phosphorus: 4.2 mg/dL (ref 2.3–4.6)
Potassium: 3.7 mEq/L (ref 3.5–5.1)
Sodium: 134 mEq/L — ABNORMAL LOW (ref 135–145)

## 2010-04-13 LAB — IGG, IGA, IGM
IgA: 263 mg/dL (ref 68–378)
IgG (Immunoglobin G), Serum: 1230 mg/dL (ref 694–1618)
IgM, Serum: 32 mg/dL — ABNORMAL LOW (ref 60–263)

## 2010-04-13 LAB — CBC
HCT: 27 % — ABNORMAL LOW (ref 36.0–46.0)
Hemoglobin: 8.5 g/dL — ABNORMAL LOW (ref 12.0–15.0)
MCH: 29.9 pg (ref 26.0–34.0)
MCHC: 31.5 g/dL (ref 30.0–36.0)
RBC: 2.84 MIL/uL — ABNORMAL LOW (ref 3.87–5.11)

## 2010-04-13 LAB — PROTEIN ELECTROPH W RFLX QUANT IMMUNOGLOBULINS
Alpha-2-Globulin: 9.4 % (ref 7.1–11.8)
Gamma Globulin: 20.9 % — ABNORMAL HIGH (ref 11.1–18.8)
M-Spike, %: NOT DETECTED g/dL
Total Protein ELP: 5.5 g/dL — ABNORMAL LOW (ref 6.0–8.3)

## 2010-04-13 LAB — PROTIME-INR
INR: 2.37 — ABNORMAL HIGH (ref 0.00–1.49)
Prothrombin Time: 26 seconds — ABNORMAL HIGH (ref 11.6–15.2)

## 2010-04-13 LAB — IMMUNOFIXATION ADD-ON

## 2010-04-14 ENCOUNTER — Inpatient Hospital Stay (HOSPITAL_COMMUNITY): Payer: PRIVATE HEALTH INSURANCE

## 2010-04-14 LAB — CBC
HCT: 27.2 % — ABNORMAL LOW (ref 36.0–46.0)
Hemoglobin: 8.6 g/dL — ABNORMAL LOW (ref 12.0–15.0)
MCH: 29.8 pg (ref 26.0–34.0)
MCHC: 31.6 g/dL (ref 30.0–36.0)
MCV: 94.1 fL (ref 78.0–100.0)
RBC: 2.89 MIL/uL — ABNORMAL LOW (ref 3.87–5.11)

## 2010-04-14 LAB — HEPARIN LEVEL (UNFRACTIONATED): Heparin Unfractionated: 0.79 IU/mL — ABNORMAL HIGH (ref 0.30–0.70)

## 2010-04-15 LAB — CBC
HCT: 25.6 % — ABNORMAL LOW (ref 36.0–46.0)
Hemoglobin: 8.1 g/dL — ABNORMAL LOW (ref 12.0–15.0)
MCH: 29.9 pg (ref 26.0–34.0)
MCHC: 31.6 g/dL (ref 30.0–36.0)
MCV: 94.5 fL (ref 78.0–100.0)
RBC: 2.71 MIL/uL — ABNORMAL LOW (ref 3.87–5.11)

## 2010-04-15 LAB — PROTIME-INR
INR: 1.55 — ABNORMAL HIGH (ref 0.00–1.49)
Prothrombin Time: 18.8 seconds — ABNORMAL HIGH (ref 11.6–15.2)

## 2010-04-15 LAB — HEPARIN LEVEL (UNFRACTIONATED): Heparin Unfractionated: 0.5 IU/mL (ref 0.30–0.70)

## 2010-04-16 LAB — CBC
HCT: 27.4 % — ABNORMAL LOW (ref 36.0–46.0)
Hemoglobin: 8.7 g/dL — ABNORMAL LOW (ref 12.0–15.0)
MCHC: 31.8 g/dL (ref 30.0–36.0)
WBC: 7.8 10*3/uL (ref 4.0–10.5)

## 2010-04-16 LAB — HEPARIN LEVEL (UNFRACTIONATED): Heparin Unfractionated: 0.3 IU/mL (ref 0.30–0.70)

## 2010-04-16 LAB — PROTIME-INR
INR: 1.28 (ref 0.00–1.49)
Prothrombin Time: 16.2 seconds — ABNORMAL HIGH (ref 11.6–15.2)

## 2010-04-16 NOTE — Progress Notes (Signed)
[name], [name]               ACCOUNT NO.:  [id number]  MEDICAL RECORD NO.:  [id number]          PATIENT TYPE:  INP  LOCATION:  A327                          FACILITY:  [facility]  PHYSICIAN:  Harjit Leider L. Lendell [name], MDDATE OF BIRTH:  23-Oct-1970  DATE OF PROCEDURE: DATE OF DISCHARGE:                                PROGRESS NOTE   SUBJECTIVE:  The patient started vomiting again last night.  I stopped her scheduled Reglan as I was concerned it may be causing fevers.  She has had Zofran 4 mg IV and Phenergan 12.5 mg IV without much relief. She reports that Phenergan usually helps at home.  She has no diarrhea. She has had no hematemesis.  She has diffuse abdominal aching from the continuous vomiting, but no focalized pain.  OBJECTIVE:  VITAL SIGNS:  Her maximum temperature is 99.4, heart rate has ranged 89-106, respiratory rate 20, blood pressure 107/76, oxygen saturation 98% on room air. GENERAL:  The patient appears uncomfortable with an emesis basin at her side.  She has a sort of diffuse flushing of her face, but no malar rash per se. ABDOMEN:  Soft, nontender, nondistended. LUNGS:  Clear to auscultation bilaterally without wheezes, rhonchi or rales. CARDIOVASCULAR:  Regular rate and rhythm without murmurs, gallops or rubs. EXTREMITIES:  No clubbing, cyanosis or edema. SKIN:  No rash other than as above.  LABORATORY DATA:  CBC is significant for a hemoglobin of 9.7, hematocrit 28, otherwise unremarkable.  Sed rate is 118, INR today is 2.45.  HIV is pending.  Rheumatoid factor is pending.  All blood cultures which are 7 since admission are negative.  ASSESSMENT AND PLAN: 1. Fever of unknown origin.  The patient will have a TEE tomorrow to     rule out vegetation, though her cultures have all been negative.     Her lupus workup is not consistent with lupus flare and so I do not     think that lupus is causing fever.  We will need to follow up her     HIV and rheumatoid factor.   Her fever curve has trended down since     I have stopped her Reglan.  I will stop all other nonessential     medications for now.  If TEE is negative, we could potentially     Doppler her right leg.  She had a Doppler of her left leg that was     negative.  This was due to pain.  She has been anticoagulated and     it is unlikely she has a DVT or PE.  She has had a fairly     unremarkable CT of the chest, abdomen and pelvis.  Her wound site     from the Port-A-Cath does not show continuing infection.  She has a     tiny area of fluid in the gallbladder fossa, but I doubt this is     infected due to its small size, lack of right upper quadrant     tenderness or pain, lack of surrounding inflammation.  Dr. Lovell Sheehan     also feels this is  not a postoperative abscess.  She has remained     off antibiotics.  She has had no further diarrhea.  Her last stool     for C. diff was negative and she has completed her course of     treatment for C. diff colitis.  Her Port-A-Cath is out for pocket     and she has been treated for pocket infection with 6 days of     antibiotic.  She remains off antibiotic as she has no documented     ongoing infectious tissue. 2. Continued nausea:  I wonder whether this is a delayed effects from     chemotherapy.  I will increase her Phenergan, continue Zofran.  The     Reglan was helping, but I have stopped it to see whether this was     causing fevers.  If she is still having nausea despite higher dose     of Phenergan and Zofran, consider Compazine.  I will stop all     nonessential medications as mentioned above. 3. Status post mitral valve replacement with mechanical valve, nearly     therapeutic on Coumadin.  I will stop her Lovenox.  She was on     Lovenox as she was slightly subtherapeutic. 4. Breast cancer, status post 2 cycles of chemotherapy. 5. Lupus. 6. History of hemolytic anemia, status post IV immunoglobulins. 7. Status post cholecystectomy. 8.  Hypothyroidism.     [name] L. Lendell [name], MD     CLS/MEDQ  D:  03/22/2010  T:  03/23/2010  Job:  [id number]  Electronically Signed by Crista Curb MD on 04/16/2010 09:25:04 PM

## 2010-04-17 LAB — CULTURE, BLOOD (ROUTINE X 2)
Culture  Setup Time: 201202210111
Culture: NO GROWTH

## 2010-04-17 LAB — CBC
Hemoglobin: 8.3 g/dL — ABNORMAL LOW (ref 12.0–15.0)
MCH: 30 pg (ref 26.0–34.0)
MCHC: 31.4 g/dL (ref 30.0–36.0)
Platelets: 144 10*3/uL — ABNORMAL LOW (ref 150–400)
RBC: 2.77 MIL/uL — ABNORMAL LOW (ref 3.87–5.11)

## 2010-04-17 LAB — PROTIME-INR: Prothrombin Time: 14.6 seconds (ref 11.6–15.2)

## 2010-04-17 LAB — COMPREHENSIVE METABOLIC PANEL
CO2: 27 mEq/L (ref 19–32)
Calcium: 8.4 mg/dL (ref 8.4–10.5)
Creatinine, Ser: 0.68 mg/dL (ref 0.4–1.2)
GFR calc non Af Amer: >60 mL/min (ref >60)
Glucose, Bld: 97 mg/dL (ref 70–99)
Total Protein: 6 g/dL (ref 6.0–8.3)

## 2010-04-17 LAB — HEPARIN LEVEL (UNFRACTIONATED): Heparin Unfractionated: 0.4 IU/mL (ref 0.30–0.70)

## 2010-04-18 LAB — CBC
HCT: 27 % — ABNORMAL LOW (ref 36.0–46.0)
Hemoglobin: 8.6 g/dL — ABNORMAL LOW (ref 12.0–15.0)
MCV: 95.1 fL (ref 78.0–100.0)
RBC: 2.84 MIL/uL — ABNORMAL LOW (ref 3.87–5.11)
WBC: 6 10*3/uL (ref 4.0–10.5)

## 2010-04-18 LAB — PROTIME-INR
INR: 1.09 (ref 0.00–1.49)
Prothrombin Time: 14.3 seconds (ref 11.6–15.2)

## 2010-04-18 LAB — HEPARIN LEVEL (UNFRACTIONATED): Heparin Unfractionated: 0.51 IU/mL (ref 0.30–0.70)

## 2010-04-18 NOTE — Progress Notes (Signed)
NAMEDAMONICA, CHOPRA NO.:  [id number]  MEDICAL RECORD NO.:  [id number]           PATIENT TYPE:  LOCATION:                                 FACILITY:  PHYSICIAN:  Mauro Kaufmann, MD         DATE OF BIRTH:  1970-11-18                                PROGRESS NOTE   PRIMARY CARE PHYSICIAN: Corrie Mckusick, MD  PRIMARY ONCOLOGIST: Ladona Horns. Mariel Sleet, MD  CONSULTANTS THIS ADMISSION: Dr. Porfirio Mylar Dohmeier with Neurology, Dr. Ninetta Lights with Infectious Disease, Dr. Ovidio Kin with Surgery.  CHIEF COMPLAINT/REASON FOR ADMISSION: Ms. 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  She has a recent diagnosis of breast cancer and has been on chemotherapy. She was recently discharged from [facility] after a prolonged 3-week hospitalization that involved Port-A-Cath sepsis, bacteremia, and suspected C. diff.  She eventually was discharged home.  Just prior to leaving the hospital, she had reported some tingling in her feet but no significant abnormalities were found.  After arriving home, she was unable to stand and actually fell up the stairs and was unable to bear weight.  Because of these findings, she presented back to the hospital for further evaluation.  On clinical exam she did have focal lower extremity neurological deficits with decreased sensation, paresthesias in the feet, marked diminished bilateral DTRs from the knees down, and the significant gait instability especially with taking steps especially attempting to flex the hips and walk upwards like on steps or to place 1 foot in front of the other heel to toe.  She was unable to accomplish this.  She was not having any other signs of ascending paralysis such as incontinence, shortness of breath.  No difficulty swallowing or managing her oral secretions.   Because she has been recently treated for an infection though, Guillain-Barre was high on the list of concerns, so therefore the hospitalists were asked to admit the patient for the possibility of Guillain-Barre.  This case had also been discussed with Dr. Sandria Manly from Neurology who concurred.  On clinical exam, the admitting physician found a patient who was in no acute distress, lying comfortably in the bed.  Her temperature was 98.2, heart rate 98, BP 104/84, and her pulse ox was 100% on room air, and her respirations were 20.  Clinically again, there were no acute findings other than the abnormal lower extremity DTRs and decreased strength 4/5 in the lower extremities and diminished reflexes in lower extremities.  She had initially presented to [facility] and multiple laboratory values were drawn at [facility] but had not resulted by the time the patient had transferred over and arrived at [facility].  Those labs included a CBC, BMP, TSH, ESR, CRP CPK and aldolase.  PAST MEDICAL HISTORY: 1. Systemic lupus erythematosus. 2. Breast cancer on chemo preoperatively prior to mastectomy. 3. Autoimmune hemolytic anemia. 4. 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 5. Mild obesity. 6. Hypothyroidism. 7. Prior mitral valve replacement on chronic Coumadin  anticoagulation.  ADMITTING DIAGNOSES: 1. Worsening ascending weakness concerning for Guillain-Barre     syndrome. 2. Mitral valve replacement, on chronic Coumadin. 3. Systemic lupus erythematosus, on Plaquenil. 4. Diagnosis of breast cancer on chemotherapy followed by Dr.     Mariel Sleet. 5. Hypothyroidism.  DIAGNOSTICS: 1. MRI of the thoracic and lumbar spine with and without contrast,     February 21, shows no evidence of metastatic disease, abscess, or     diskitis, but she was found to have mild lumbar disk degeneration. 2. The patient has undergone a nerve conduction study on February 22.     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  LABORATORY DATA: Protein electrophoresis shows a possibility of a faint restricted band, not totally excluded in the gamma region but no definitive M spike. 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  Total protein was 5.5, serum albumin was 49.4, serum alpha-1 was 7.1, and serum gammaglobulin was 20.9. Otherwise, SPEP was within normal limits.  Serum IgA and IgG were normal.  IgM was slightly low at 32.  LDH was elevated at 363 which is consistent with the patient's prior history of autoimmune hemolytic anemia.  CPK was normal at 17.  CD4, CD8 count showed a total lymphocyte count of 3270, CD4 percent was 7, CD4 absolute 210, CD8 tox was 68, CD8 absolute was 2220 and the ratio was 0.10.  Urinalysis was consistent with a possible UTI.  Did not appear as if cultures have been obtained. HIV antibody was nonreactive.  MRSA PCR screening was negative with no virus.  Antibody IgG was 2.21.12.  ESR was slightly high at 34 and as of February 23, white count 5600, hemoglobin 8.5, hematocrit 27, platelets 145,000, neutrophils 19%, lymphocytes 65%, absolute neutrophils 1.1, absolute lymphocytes 3.7.  PT 26, INR 2.37.  Sodium 134, potassium 3.7, chloride 105, CO2 25, glucose 86, BUN 8, creatinine 0.7.  Blood cultures x2 bottles from February 20 show no growth to date.  HOSPITAL COURSE: 1. Lower extremity weakness concerning for Guillain-Barre syndrome.     The patient presented and due to lower extremity weakness that     seemed to be ascending from the feet to the knees there was some     concern given her recent hospitalization for infectious process, that     she may have Guillain-Barre syndrome.  She was evaluated by     Neurology.  Initially, this was felt to be a strong possibility so     she was given IVIG.  Subsequent evaluation including a nerve     conduction study and further questioning  of the patient and review     of the patient's history and symptoms were more consistent with     peripheral neuropathy or myopathy syndrome.  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  Neurology felt that etiology of this was     most likely due to chemotherapy and recent Flagyl.  Also, Plaquenil     can cause myopathic symptoms.  The patient's CPK was normal.  ESR     was slightly elevated.  At the present time, the IVIG has been     discontinued and recommendations are aggressive PT and OT and     eventual evaluation for [facility]. 2. Fever of unknown origin.  At home,  the patient was having fevers of     unknown origin.  ID was asked to assist.  Extensive workup was     completed and no source of infection was found.  After admission,     the patient had no further fevers and no leukocytosis. 3. Invasive breast cancer.  Prior to admission, the patient has been     attempting to undergo preoperative chemotherapy treatment prior to     undergoing a possible mastectomy by Dr. Ovidio Kin.     Unfortunately, she has had several complications related to either     chronic medical problems and/or chemotherapy that has required her     to be hospitalized.  Dr. Ezzard Standing evaluated the patient today and     since she is in a relative stable condition at this point she is     an appropriate candidate to proceed with surgical procedure. He     plans on proceeding with either lumpectomy versus mastectomy on     Thursday of next week, i.e. at least 5-7 days from now.  He would     like if possible for her to remain in the inpatient setting up     until her surgery, be off Coumadin for 5 days, and then be     evaluated for at least 24 hours postop and then if she is eligible     for [facility], transfer to rehab at that time. 4. Systemic lupus erythematosus with associated secondary Raynaud's.      The patient has had Raynaud's for years and has had mild     exacerbation just prior to admission and since admission, we do     feel that a lot of the tingling that is going on in her extremities     is actually related to her Raynaud's and not directly related to     the peripheral neuropathy. 5. Autoimmune hemolytic anemia.  At the present time, the patient's     hemoglobin and platelets are stable. 6. Hypothyroidism.  The patient had TSH checked on January 20 and was     within normal limits. 7. Mechanical mitral valve on Coumadin.  The patient's INR today was     2.37.  Her Coumadin now has been placed on hold for the next 5 days     until she is able to undergo operative intervention as previously     described.  Pharmacy is now dosing IV heparin as bridge until she     is appropriate to restart on her Coumadin. 8. Disposition.  At the present time, the patient is appropriate to     transfer to 3000 unit non-telemetry bed where she will remain until     she is able to undergo operative intervention regarding her breast     cancer.  If all goes well after this and she is deemed appropriate     candidate for [facility], she needs to be discharged to     there to continue with aggressive PT and OT due to her axonal     neuropathy.     Allison L. Rennis Harding, N.P.   ______________________________ Mauro Kaufmann, MD    ALE/MEDQ  D:  04/13/2010  T:  04/13/2010  Job:  [id number]  Electronically Signed by Junious Silk N.P. on 04/14/2010 04:05:08 PM Electronically Signed by Sibyl Parr Siyah Mault  on 04/18/2010 08:38:03 AM

## 2010-04-18 NOTE — Miscellaneous (Signed)
Summary: Home Care Report/ [facility]  Home Care Report/ [facility]   Imported By: Dorise Hiss 04/11/2010 10:31:08  _____________________________________________________________________  External Attachment:    Type:   Image     Comment:   External Document

## 2010-04-18 NOTE — Miscellaneous (Signed)
Summary: Home Care Report/ [facility]  Home Care Report/ [facility]   Imported By: Dorise Hiss 04/11/2010 10:49:56  _____________________________________________________________________  External Attachment:    Type:   Image     Comment:   External Document

## 2010-04-18 NOTE — Letter (Signed)
Summary: Internal Correspondence/ [facility]  Internal Correspondence/ [facility]   Imported By: Dorise Hiss 04/14/2010 15:17:58  _____________________________________________________________________  External Attachment:    Type:   Image     Comment:   External Document

## 2010-04-19 LAB — CBC
MCH: 29.8 pg (ref 26.0–34.0)
Platelets: 153 10*3/uL (ref 150–400)
RBC: 2.89 MIL/uL — ABNORMAL LOW (ref 3.87–5.11)
WBC: 6.3 10*3/uL (ref 4.0–10.5)

## 2010-04-19 LAB — HEPARIN LEVEL (UNFRACTIONATED): Heparin Unfractionated: 0.46 IU/mL (ref 0.30–0.70)

## 2010-04-19 LAB — PROTIME-INR
INR: 1.09 (ref 0.00–1.49)
Prothrombin Time: 14.3 seconds (ref 11.6–15.2)

## 2010-04-19 LAB — SURGICAL PCR SCREEN: MRSA, PCR: NEGATIVE

## 2010-04-19 LAB — TYPE AND SCREEN: ABO/RH(D): O POS

## 2010-04-20 ENCOUNTER — Other Ambulatory Visit: Payer: Self-pay | Admitting: Surgery

## 2010-04-20 ENCOUNTER — Other Ambulatory Visit (HOSPITAL_COMMUNITY): Payer: PRIVATE HEALTH INSURANCE

## 2010-04-20 ENCOUNTER — Inpatient Hospital Stay (HOSPITAL_COMMUNITY): Payer: PRIVATE HEALTH INSURANCE

## 2010-04-20 ENCOUNTER — Encounter: Payer: Self-pay | Admitting: Cardiology

## 2010-04-20 LAB — CBC
HCT: 28.6 % — ABNORMAL LOW (ref 36.0–46.0)
RBC: 2.99 MIL/uL — ABNORMAL LOW (ref 3.87–5.11)
RDW: 17.4 % — ABNORMAL HIGH (ref 11.5–15.5)
WBC: 7.4 10*3/uL (ref 4.0–10.5)

## 2010-04-20 LAB — APTT: aPTT: 41 seconds — ABNORMAL HIGH (ref 24–37)

## 2010-04-20 MED ORDER — TECHNETIUM TC 99M SULFUR COLLOID FILTERED
1.0000 | Freq: Once | INTRAVENOUS | Status: AC | PRN
  Administered 2010-04-20: 1 via INTRADERMAL

## 2010-04-20 NOTE — Discharge Summary (Addendum)
[name], [name]               ACCOUNT NO.:  [id number]  MEDICAL RECORD NO.:  [id number]           PATIENT TYPE:  I  LOCATION:                                 FACILITY:  PHYSICIAN:  Cyrena Kuchenbecker L. Lendell [name], MDDATE OF BIRTH:  03-18-1970  DATE OF ADMISSION: DATE OF DISCHARGE:  LH                              DISCHARGE SUMMARY   DISCHARGE DIAGNOSES: 1. Infected Port-A-Cath, status post removal. 2. Pancytopenia secondary to chemotherapy. 3. History of breast cancer, being treated with chemotherapy and     awaiting eventual surgical resection. 4. History of lupus. 5. History of hemolytic anemia. 6. History of mechanical mitral valve replacement, maintained on     Coumadin. 7. Clostridium difficile colitis, treated. 8. Continued fever of unknown origin, workup completely negative for     any ongoing infection, possibly lupus related. 9. Treated candidal vaginitis. 10.Hypokalemia. 11.Hypomagnesemia. 12.Multifactorial nausea, vomiting. 13.Hypothyroidism. 14.Status post cholecystectomy. 15.Hyponatremia. 16.Dehydration. 17.Mild peripheral edema.  DISCHARGE MEDICATIONS: 1. Pepcid 20 mg p.o. b.i.d. 2. Vicodin 5/325 one p.o. q.4 h. p.r.n. pain. 3. Micafungin 50 mg IV daily until April 07, 2010. 4. Phenergan 12.5 to 25 mg p.o. q.6 h. p.r.n. nausea. 5. Prednisone 10 mg daily. 6. Cyanocobalamin 1000 mcg IM monthly. 7. Flexeril 10 mg twice a day as needed for muscle relaxant. 8. Folic acid 1 mg a day. 9. Lasix 20 mg a day. 10.Lorazepam 1 mg p.o. q.6 h. p.r.n. anxiety. 11.Plaquenil 200 mg twice a day. 12.KCl 20 mEq every morning. 13.Synthroid 25 mcg a day. 14.Warfarin alternate 7 and 10 mg a day. 15.Stop Motrin. 16.Stop scopolamine. 17.Stop Compazine. 18.Stop lidocaine. 19.Stop Prilosec. 20.Stop metoprolol.  CONDITION:  Stable.  ACTIVITY:  No driving for 2 weeks.  DIET:  Regular.  WOUND CARE:  She is to pack.  Home health nurse is to assist with packing her  Port-A-Cath wound site daily with gauze.  FOLLOWUP:  With Dr. Mariel Sleet as previously scheduled.  Home health is being arranged to assist with PICC line care and Micafungin administration  CONSULTATIONS:  Ladona Horns. Mariel Sleet, MD, Tilford Pillar, MD, Gerrit Friends. Dietrich Pates, MD, [facility], Interventional Radiology.  PROCEDURES:  Port-A-Cath removal.  Transesophageal echocardiogram, PICC line placement, percutaneous drainage of gallbladder fossa fluid.  LABORATORY DATA:  Urine culture negative.  Stool for ova and parasites showed no ova or parasites.  Abundant yeast present.  Port-A-Cath tip culture negative.  Stool culture negative.  Blood cultures repeatedly negative.  Urine culture negative multiple times.  Gallbladder fossa fluid culture negative.  On admission, white blood cell count was 400 with 16% neutrophils, 37% lymphocytes, 3% monocytes, 34% eosinophils, 11% basophils.  Hemoglobin was 9.5, hematocrit 26.5, platelet count was 82,000.  At discharge, her platelet count and white count are normal. Her hemoglobin is 8.9, hematocrit is 28.6, INR on admission was 1.3, reticulocyte count was low at 3.3, reticulocyte percentage was 0.1%.  At discharge, her INR is 2.17.  On admission, complete metabolic panel was normal.  Her magnesium initially was normal and dropped to a low of 1.3. Her potassium dropped to a low of 3.1.  Her lipase was normal. Procalcitonin less than  0.1.  Venous lactic acid 1.2, LDL 53, HDL 49, triglycerides 76, total cholesterol 117, B12 level greater than 2000, TSH 2.798, free T4 1.02, haptoglobin less than 6.  Urinalysis showed a specific gravity greater than 1.030, negative nitrite, negative leukocyte esterase, 0-2 white cells, 0-2 red cells, many bacteria. Trace ketones, trace blood.  C. diff PCR the day of admission was positive.  Repeat C. diff PCR was negative multiple times.  Rheumatoid factor less than 10, anti double-stranded DNA antibody was 10 which is negative.   Anti-smith antibody was 3 which was negative.  Urine for Chlamydia and gonorrhea negative.  DIAGNOSTICS:  Chest x-ray showed cardiomegaly.  Ultrasound of the left leg showed no DVT.  Acute abdominal series showed nonspecific bowel gas pattern.  Multiple repeat chest x-rays were done without any infiltrate. CT of the chest showed tiny bilateral pleural effusions, interval decrease in size of opacity at prior breast biopsy site of left breast, open wound superior right breast.  CT of the abdomen and pelvis showed nonspecific free pelvic fluid, small residual fluid collection and gallbladder fossa, patchiness of bilateral nephrograms.  Pelvic ultrasound was ordered, but not resulted.  Pelvic ultrasound showed tiny probable leiomyoma and small right paraovarian cyst.  She had a repeat CT of the abdomen and pelvis on March 26, 2010, which showed minimally increased nonspecific fluid collection in the cholecystectomy bag and stable mild periportal edema and splenomegaly.  Interval improvement of the kidney appearance.  DIAGNOSTICS:  Transthoracic echocardiogram showed normal ejection fraction, mild concentric hypertrophy, mechanical mitral valve functioning normally, moderately dilated left atrium, mildly dilated right atrium.  Peak pulmonary artery pressure was 40 mmHg. Transesophageal echocardiogram showed no vegetation.  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  HISTORY AND HOSPITAL COURSE:  Please see H and P and dictated progress notes.  [name] is a pleasant 40 year old white female with multiple medical problems including breast cancer.  She was directly admitted from Dr. Thornton Papas office with an infected Port-A-Cath.  She had purulence draining from the Port-A-Cath area.  She also had erythematous nodules over her abdomen which were somewhat tender.  She had a blood pressure of 115/75, but it was reportedly in the 90s in the office.  She was  afebrile and had otherwise normal vital signs.  She had a mitral valve click and Port-A-Cath site was erythematous.  She had erythematous nodules on her abdomen measuring about 2 x 2 cm.  She was admitted to the Intensive Care Unit and surgery was consulted to remove the Port-A- Cath.  The Port-A-Cath tip was negative on culture, but it was the pocket that was reportedly draining pus in the office.  She was started on broad-spectrum antibiotics initially.  She received Neupogen and Dr. Mariel Sleet followed along.  Due to her history of hemolytic anemia, IVIG was administered.  The patient also developed diarrhea and was C. diff PCR positive and was started on Flagyl.  She also was very nauseated intermittently with vomiting and nausea intermittently throughout the hospitalization.  I suspect it was somewhat medication related and due to her acute illness as well as also post chemotherapy.  After her various infections were treated adequately, she continued to spike intermittent fevers and an exhaustive fever workup was obtained without much evidence for ongoing infection.  She had a TEE which showed no vegetation.  She had multiple x-rays cultures that were negative for infection.  The small size of the gallbladder fossa fluid collection in the lack  of any symptoms with respect to right upper quadrant pain or tenderness argued against gallbladder fossa abscess.  Nevertheless as things were ruled out and she continued to have fevers, Interventional Radiology drained the fluid and it was sterile.  She also had negative lupus markers.  Infectious Disease assisted over the telephone and recommended an empiric course of Micafungin, as she would be at risk for a systemic fungal infection.  She will receive 2 weeks of this.  After all infections were ruled out, the concern was that her fever maybe lupus related.  She did develop a moderate malar rash and was started on low-dose prednisone.  It was  after this that her fevers defervesced and she was able to be discharged home.  Total time on the day of discharge is greater than 30 minutes.     [name] L. Lendell [name], MD     CLS/MEDQ  D:  04/20/2010  T:  04/20/2010  Job:  [id number]  Electronically Signed by Crista Curb MD on 04/26/2010 07:47:55 PM

## 2010-04-21 LAB — CBC
HCT: 27.8 % — ABNORMAL LOW (ref 36.0–46.0)
Hemoglobin: 8.8 g/dL — ABNORMAL LOW (ref 12.0–15.0)
MCHC: 31.7 g/dL (ref 30.0–36.0)
WBC: 6.6 10*3/uL (ref 4.0–10.5)

## 2010-04-21 LAB — DIFFERENTIAL
Basophils Absolute: 0 10*3/uL (ref 0.0–0.1)
Lymphocytes Relative: 44 % (ref 12–46)
Lymphs Abs: 2.9 10*3/uL (ref 0.7–4.0)
Monocytes Absolute: 0.5 10*3/uL (ref 0.1–1.0)
Neutro Abs: 3.1 10*3/uL (ref 1.7–7.7)

## 2010-04-21 LAB — PROTIME-INR: INR: 0.99 (ref 0.00–1.49)

## 2010-04-21 LAB — HEPARIN LEVEL (UNFRACTIONATED): Heparin Unfractionated: 0.11 IU/mL — ABNORMAL LOW (ref 0.30–0.70)

## 2010-04-21 NOTE — Consult Note (Signed)
NAMEBRINLEE, [name] NO.:  [id number]  MEDICAL RECORD NO.:  [id number]           PATIENT TYPE:  I  LOCATION:  3302                         FACILITY:  [facility]  PHYSICIAN:  Sandria Bales. Ezzard Standing, M.D.  DATE OF BIRTH:  1970-05-04  DATE OF CONSULTATION:  04/12/2010                                CONSULTATION   HISTORY OF PRESENT ILLNESS:  [name] is a 40 year old white female who sees Dr. Phillips Odor as her primary medical doctor, has seen Dr. Glenford Peers from an Oncology standpoint, Dr. Lewayne Bunting from a Cardiology standpoint, Dr. Nickola Major from a Rheumatology standpoint.  She was found to have a mass in her left breast on a mammogram and underwent a biopsy of this on December 28, 2009.  This revealed an invasive ductal carcinoma, and an MRI done on January 06, 2010, showed a 3.8-cm mass at the 10 o'clock position in the left breast, consistent with a primary invasive ductal carcinoma.  Because of the size of her cancer, she saw Dr. Glenford Peers for neoadjuvant chemotherapy.  I placed a right subclavian PowerPort on January 30, 2010, and she started her chemotherapy; however, since about mid December, she has had multiple problems relating to her underlying diseases, infections, and chemotherapy.  First, she was hospitalized from February 08, 2010, through February 11, 2010, with septic shock, felt to be secondary probably to cholecystitis. She also is noted to have thrombocytopenia.  On February 17, 2010, Dr. Glenna Fellows did a laparoscopic cholecystectomy.  However, postop, she still had more trouble, at which she got an infection of her Port-A-Cath, this required removal in late January, 2012.  She then developed lower extremity weakness, and this precipitated her admission at this time on April 10, 2010.  I was asked to see her in trying to coordinate treatment of her breast cancer.  She has gotten through, I think, two courses of chemotherapy  with a good response in that her primary tumor has decreased to essentially nonpalpable.  The question is, is it worth going ahead with surgery at this time despite having an incomplete neoadjuvant therapy in light of all her complications versus trying to get her back on chemotherapy. I have discussed her treatment with Dr. Mariel Sleet.  I had extensive discussion with the patient about the options here.  The other question is whether she is a candidate for lumpectomy which will require radiation therapy versus trying to do mastectomy which may avoid radiation therapy.  She has no known allergies.  She is on multiple medications. As of her February 21, 2010, discharge, the listed medications include: Percocet, Plaquenil, Synthroid, Motrin,  scopolamine for nausea,  Ativan for anxiety, cyclobenzaprine for muscle spasms,  Coumadin 5 mg on Monday, Wednesday, and Friday alternating with 2.5 mg,  cyanocobalamin injections every 3 months,  Zovirax.  PAST MEDICAL HISTORY: 1. Neurologic:  She came in with this admission with weakness     of her lower extremities.  I have spoken with Dr. 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  There  was     some concern about her having possible Guillain-Barre though  this is now     thought to be unlikely. She would benefit from rehab either in the hospital     or discharge for this. 2. Pulmonary:  She quit smoking in 2008, has had no further pulmonary     problems. 3. Cardiac:  She has a St. Jude mitral heart valve placed by Dr. Silvestre Mesi at     [facility] in 2008.  There have been concerns that this heart valve has     became infected during all these episodes of sepsis but there has     been no evidence of that at this time and her cardiac status has     been stable. 4. Gastrointestinal:  She has had no history of peptic ulcer disease,     liver disease, pancreatic disease.  She did  have a laparoscopic     cholecystectomy, from which she has recovered well. 5. Endocrine.  She had been on thyroid replacement for about 2-3     years. 6. Gynecologic:  She has two children.  She had an ablation for     bleeding about 2-3 years ago. 7. Hematologic and immune.  She sees Dr. Nickola Major for lupus and how much     this has played a role on it is unclear.  SOCIAL HISTORY:  She is on disability.  She has two children.  PHYSICAL EXAMINATION:  VITAL SIGNS:  Her temperature is 98.6.  Her pulse is 84. GENERAL:  She is a well-nourished white female, who is cooperative on physical exam. NECK:  Supple.  I feel no thyromegaly. LYMPH NODES:  She has no cervical, supraclavicular, or axillary adenopathy. BREASTS:  She has a healing wound in the upper aspect of her right chest where she had this Port-A-Cath placed.  On her left breast, she may have a little thickness in the upper inner quadrant of the left breast.  I am not sure that I feel it is the same mass, certainly what I felt in the office is much smaller, and she may have a little thickness in her axilla on the left compared to the right, again that is a fairly soft finding. LUNGS:  Clear to auscultation. HEART:  She has a loud clicking S1 from the heart valve.  She has a lower right part of her thoracotomy where she accessed the access to heart. ABDOMEN:  Soft. NEUROLOGIC:  She has weakness of her lower extremities.  I did not try to get her out of bed to walk her.  Labs that I have show a sodium 134, potassium 3.7, chloride of 105, CO2 of 25, glucose of 86, creatinine of 0.7.  She has an albumin of 2.2. Her PT on April 13, 2010, was 5 with an INR of 2.37.  Her hemoglobin is 8.5, hematocrit 27, white blood count of 5600.  She has 145,000 platelets.  Her sed rate is 34, and blood cultures drawn on the April 10, 2010, are negative at this time.  IMPRESSION: 1. Left breast cancer with a good response from  neoadjuvant     chemotherapy though the neoadjuvant chemotherapy is incomplete.  I     spoke to Dr. Mariel Sleet and will talk to him again, but it seems      that in her best interest is to go ahead with surgery at this time     for both removing the primary tumor and helping in  staging her     tumor.  I discussed with her at length the options of lumpectomy versus mastectomy and the need for sentinel lymph node.  She could have reconstruction, but we would not do that at this time, we will get her through all these treatments and address her reconstruction in 1-2 years from now.   The risks of surgery includes bleeding, infection, recurrence of the tumor and we also talked about the lymph nodes status that she had multiple lymph nodes involved and that could dictate she will need radiation to her left chest wall. 2. Healing wound in her right breast from the Port-A-Cath, this seems     clean. 3. 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  They appeared to     have ruled out Guillain-Barre. 4. Lupus.  Has been on Plaquenil. 5. Mitral valve replacement with St. Jude's valve.  On chronic     anticoagulation. 6. Her chronic anticoagulation has been managed perioperatively and     since as she would probably benefit from rehab, she will probably     stay in the hospital for her surgery with plans to get her rehab     after the surgery. 7. History of congestive heart failure.  Corrected with her heart     valve. 8. Hypertension. 9. Hypothyroidism. 10. History of ablation of her uterus.   Sandria Bales. Ezzard Standing, M.D., FACS  DHN/MEDQ  D:  04/13/2010  T:  04/13/2010  Job:  [id number]  cc:   Corrie Mckusick, M.D. Ladona Horns. Mariel Sleet, MD Learta Codding, MD,FACC Zenovia Jordan, MD Melvyn Novas, M.D. Lacretia Leigh. Ninetta Lights, M.D. Mauro Kaufmann, MD  Electronically Signed by Ovidio Kin M.D. on 04/21/2010 09:40:23 PM

## 2010-04-22 LAB — CBC
MCV: 97.2 fL (ref 78.0–100.0)
Platelets: 172 10*3/uL (ref 150–400)
RBC: 2.88 MIL/uL — ABNORMAL LOW (ref 3.87–5.11)
WBC: 5.7 10*3/uL (ref 4.0–10.5)

## 2010-04-22 LAB — PROTIME-INR
INR: 1.03 (ref 0.00–1.49)
Prothrombin Time: 13.7 seconds (ref 11.6–15.2)

## 2010-04-22 LAB — HEPARIN LEVEL (UNFRACTIONATED): Heparin Unfractionated: 0.35 IU/mL (ref 0.30–0.70)

## 2010-04-23 LAB — CBC
HCT: 27.3 % — ABNORMAL LOW (ref 36.0–46.0)
Hemoglobin: 8.6 g/dL — ABNORMAL LOW (ref 12.0–15.0)
MCH: 30.5 pg (ref 26.0–34.0)
MCHC: 31.5 g/dL (ref 30.0–36.0)
MCV: 96.8 fL (ref 78.0–100.0)
Platelets: 188 10*3/uL (ref 150–400)
RBC: 2.82 MIL/uL — ABNORMAL LOW (ref 3.87–5.11)
RDW: 17.6 % — ABNORMAL HIGH (ref 11.5–15.5)
WBC: 6.2 10*3/uL (ref 4.0–10.5)

## 2010-04-23 LAB — PROTIME-INR
INR: 1.15 (ref 0.00–1.49)
Prothrombin Time: 14.9 seconds (ref 11.6–15.2)

## 2010-04-23 LAB — HEPARIN LEVEL (UNFRACTIONATED)

## 2010-04-24 LAB — CBC
HCT: 27.9 % — ABNORMAL LOW (ref 36.0–46.0)
MCHC: 31.5 g/dL (ref 30.0–36.0)
MCV: 96.5 fL (ref 78.0–100.0)
RDW: 17.5 % — ABNORMAL HIGH (ref 11.5–15.5)

## 2010-04-25 LAB — CBC
HCT: 29.2 % — ABNORMAL LOW (ref 36.0–46.0)
Hemoglobin: 9.1 g/dL — ABNORMAL LOW (ref 12.0–15.0)
MCH: 30.5 pg (ref 26.0–34.0)
MCHC: 31.2 g/dL (ref 30.0–36.0)
RBC: 2.98 MIL/uL — ABNORMAL LOW (ref 3.87–5.11)

## 2010-04-26 LAB — CBC
HCT: 28.4 % — ABNORMAL LOW (ref 36.0–46.0)
MCH: 29.9 pg (ref 26.0–34.0)
MCV: 95.3 fL (ref 78.0–100.0)
Platelets: 210 10*3/uL (ref 150–400)
RBC: 2.98 MIL/uL — ABNORMAL LOW (ref 3.87–5.11)
RDW: 17 % — ABNORMAL HIGH (ref 11.5–15.5)

## 2010-04-26 LAB — BASIC METABOLIC PANEL
CO2: 26 mEq/L (ref 19–32)
Chloride: 100 mEq/L (ref 96–112)
Creatinine, Ser: 0.74 mg/dL (ref 0.4–1.2)
GFR calc Af Amer: >60 mL/min (ref >60)
Potassium: 3.7 mEq/L (ref 3.5–5.1)

## 2010-04-26 LAB — MAGNESIUM: Magnesium: 1.7 mg/dL (ref 1.5–2.5)

## 2010-04-27 LAB — CBC
HCT: 31.6 % — ABNORMAL LOW (ref 36.0–46.0)
Hemoglobin: 9.9 g/dL — ABNORMAL LOW (ref 12.0–15.0)
MCHC: 31.3 g/dL (ref 30.0–36.0)
RBC: 3.3 MIL/uL — ABNORMAL LOW (ref 3.87–5.11)

## 2010-04-27 LAB — PROTIME-INR
INR: 1.75 — ABNORMAL HIGH (ref 0.00–1.49)
Prothrombin Time: 20.6 seconds — ABNORMAL HIGH (ref 11.6–15.2)

## 2010-04-27 LAB — HEPARIN LEVEL (UNFRACTIONATED): Heparin Unfractionated: 0.38 IU/mL (ref 0.30–0.70)

## 2010-04-27 NOTE — Miscellaneous (Signed)
Summary: Home Care Report/ [facility]  Home Care Report/ [facility]   Imported By: Dorise Hiss 04/17/2010 15:41:06  _____________________________________________________________________  External Attachment:    Type:   Image     Comment:   External Document

## 2010-04-28 LAB — CBC
Hemoglobin: 9.6 g/dL — ABNORMAL LOW (ref 12.0–15.0)
MCH: 29.3 pg (ref 26.0–34.0)
Platelets: 264 10*3/uL (ref 150–400)
RBC: 3.28 MIL/uL — ABNORMAL LOW (ref 3.87–5.11)
WBC: 7.1 10*3/uL (ref 4.0–10.5)

## 2010-04-28 LAB — HEPARIN LEVEL (UNFRACTIONATED): Heparin Unfractionated: 0.6 IU/mL (ref 0.30–0.70)

## 2010-04-28 LAB — PROTIME-INR
INR: 2.2 — ABNORMAL HIGH (ref 0.00–1.49)
Prothrombin Time: 24.6 seconds — ABNORMAL HIGH (ref 11.6–15.2)

## 2010-04-28 LAB — ANA: Anti Nuclear Antibody(ANA): POSITIVE — AB

## 2010-04-29 LAB — CBC
HCT: 30.4 % — ABNORMAL LOW (ref 36.0–46.0)
Hemoglobin: 9.8 g/dL — ABNORMAL LOW (ref 12.0–15.0)
MCHC: 32.2 g/dL (ref 30.0–36.0)

## 2010-04-29 LAB — PROTIME-INR: INR: 2.2 — ABNORMAL HIGH (ref 0.00–1.49)

## 2010-05-01 LAB — PROCALCITONIN: Procalcitonin: <0.1 ng/mL

## 2010-05-01 LAB — IGG, IGA, IGM
IgA: 464 mg/dL — ABNORMAL HIGH (ref 68–378)
IgM, Serum: 60 mg/dL (ref 60–263)

## 2010-05-01 LAB — BASIC METABOLIC PANEL
BUN: 15 mg/dL (ref 6–23)
BUN: 2 mg/dL — ABNORMAL LOW (ref 6–23)
BUN: 3 mg/dL — ABNORMAL LOW (ref 6–23)
BUN: 4 mg/dL — ABNORMAL LOW (ref 6–23)
BUN: 5 mg/dL — ABNORMAL LOW (ref 6–23)
BUN: 7 mg/dL (ref 6–23)
BUN: 9 mg/dL (ref 6–23)
CO2: 23 mEq/L (ref 19–32)
CO2: 26 mEq/L (ref 19–32)
CO2: 28 mEq/L (ref 19–32)
CO2: 29 mEq/L (ref 19–32)
CO2: 25 mEq/L (ref 19–32)
Calcium: 8.5 mg/dL (ref 8.4–10.5)
Calcium: 8.1 mg/dL — ABNORMAL LOW (ref 8.4–10.5)
Calcium: 8.4 mg/dL (ref 8.4–10.5)
Chloride: 100 mEq/L (ref 96–112)
Chloride: 104 mEq/L (ref 96–112)
Chloride: 104 mEq/L (ref 96–112)
Chloride: 104 mEq/L (ref 96–112)
Chloride: 105 mEq/L (ref 96–112)
Chloride: 109 mEq/L (ref 96–112)
Creatinine, Ser: 0.55 mg/dL (ref 0.4–1.2)
Creatinine, Ser: 0.61 mg/dL (ref 0.4–1.2)
Creatinine, Ser: 0.72 mg/dL (ref 0.4–1.2)
Creatinine, Ser: 0.74 mg/dL (ref 0.4–1.2)
Creatinine, Ser: 0.78 mg/dL (ref 0.4–1.2)
Creatinine, Ser: 0.8 mg/dL (ref 0.4–1.2)
Creatinine, Ser: 0.91 mg/dL (ref 0.4–1.2)
GFR calc Af Amer: >60 mL/min (ref >60)
GFR calc Af Amer: >60 mL/min (ref >60)
GFR calc Af Amer: >60 mL/min (ref >60)
GFR calc Af Amer: >60 mL/min (ref >60)
GFR calc non Af Amer: >60 mL/min (ref >60)
GFR calc non Af Amer: >60 mL/min (ref >60)
GFR calc non Af Amer: >60 mL/min (ref >60)
GFR calc non Af Amer: >60 mL/min (ref >60)
GFR calc non Af Amer: >60 mL/min (ref >60)
Glucose, Bld: 112 mg/dL — ABNORMAL HIGH (ref 70–99)
Glucose, Bld: 121 mg/dL — ABNORMAL HIGH (ref 70–99)
Glucose, Bld: 93 mg/dL (ref 70–99)
Potassium: 3.6 mEq/L (ref 3.5–5.1)
Potassium: 3.4 mEq/L — ABNORMAL LOW (ref 3.5–5.1)
Sodium: 136 mEq/L (ref 135–145)
Sodium: 138 mEq/L (ref 135–145)
Sodium: 140 mEq/L (ref 135–145)

## 2010-05-01 LAB — URINE CULTURE: Culture  Setup Time: 201112211105

## 2010-05-01 LAB — COMPREHENSIVE METABOLIC PANEL
ALT: 21 U/L (ref 0–35)
ALT: 61 U/L — ABNORMAL HIGH (ref 0–35)
ALT: 34 U/L (ref 0–35)
AST: 14 U/L (ref 0–37)
AST: 9 U/L (ref 0–37)
AST: 49 U/L — ABNORMAL HIGH (ref 0–37)
AST: 37 U/L (ref 0–37)
Albumin: 3.4 g/dL — ABNORMAL LOW (ref 3.5–5.2)
Albumin: 2.9 g/dL — ABNORMAL LOW (ref 3.5–5.2)
Albumin: 3.4 g/dL — ABNORMAL LOW (ref 3.5–5.2)
Alkaline Phosphatase: 38 U/L — ABNORMAL LOW (ref 39–117)
Alkaline Phosphatase: 44 U/L (ref 39–117)
Alkaline Phosphatase: 56 U/L (ref 39–117)
BUN: 5 mg/dL — ABNORMAL LOW (ref 6–23)
BUN: 3 mg/dL — ABNORMAL LOW (ref 6–23)
CO2: 22 mEq/L (ref 19–32)
Calcium: 8 mg/dL — ABNORMAL LOW (ref 8.4–10.5)
Calcium: 8.3 mg/dL — ABNORMAL LOW (ref 8.4–10.5)
Calcium: 8.5 mg/dL (ref 8.4–10.5)
Calcium: 8.8 mg/dL (ref 8.4–10.5)
Chloride: 112 mEq/L (ref 96–112)
Creatinine, Ser: 0.51 mg/dL (ref 0.4–1.2)
Creatinine, Ser: 0.63 mg/dL (ref 0.4–1.2)
Creatinine, Ser: 0.76 mg/dL (ref 0.4–1.2)
GFR calc Af Amer: >60 mL/min (ref >60)
GFR calc Af Amer: >60 mL/min (ref >60)
GFR calc Af Amer: >60 mL/min (ref >60)
GFR calc non Af Amer: >60 mL/min (ref >60)
Glucose, Bld: 105 mg/dL — ABNORMAL HIGH (ref 70–99)
Potassium: 3.6 mEq/L (ref 3.5–5.1)
Potassium: 3.7 mEq/L (ref 3.5–5.1)
Potassium: 3.4 mEq/L — ABNORMAL LOW (ref 3.5–5.1)
Sodium: 138 mEq/L (ref 135–145)
Sodium: 139 mEq/L (ref 135–145)
Sodium: 138 mEq/L (ref 135–145)
Total Bilirubin: 0.8 mg/dL (ref 0.3–1.2)
Total Protein: 6.1 g/dL (ref 6.0–8.3)
Total Protein: 5.2 g/dL — ABNORMAL LOW (ref 6.0–8.3)
Total Protein: 6.1 g/dL (ref 6.0–8.3)
Total Protein: 7 g/dL (ref 6.0–8.3)

## 2010-05-01 LAB — CBC
HCT: 24.6 % — ABNORMAL LOW (ref 36.0–46.0)
HCT: 25.1 % — ABNORMAL LOW (ref 36.0–46.0)
HCT: 30 % — ABNORMAL LOW (ref 36.0–46.0)
HCT: 30.5 % — ABNORMAL LOW (ref 36.0–46.0)
HCT: 25.2 % — ABNORMAL LOW (ref 36.0–46.0)
HCT: 28.5 % — ABNORMAL LOW (ref 36.0–46.0)
HCT: 31.2 % — ABNORMAL LOW (ref 36.0–46.0)
Hemoglobin: 10.6 g/dL — ABNORMAL LOW (ref 12.0–15.0)
Hemoglobin: 10.8 g/dL — ABNORMAL LOW (ref 12.0–15.0)
Hemoglobin: 9.1 g/dL — ABNORMAL LOW (ref 12.0–15.0)
Hemoglobin: 9.1 g/dL — ABNORMAL LOW (ref 12.0–15.0)
MCH: 31.2 pg (ref 26.0–34.0)
MCH: 31.5 pg (ref 26.0–34.0)
MCH: 31.7 pg (ref 26.0–34.0)
MCH: 31.8 pg (ref 26.0–34.0)
MCH: 31.9 pg (ref 26.0–34.0)
MCH: 30.9 pg (ref 26.0–34.0)
MCH: 31 pg (ref 26.0–34.0)
MCH: 31.3 pg (ref 26.0–34.0)
MCH: 31.7 pg (ref 26.0–34.0)
MCHC: 35.4 g/dL (ref 30.0–36.0)
MCHC: 36 g/dL (ref 30.0–36.0)
MCHC: 36.3 g/dL — ABNORMAL HIGH (ref 30.0–36.0)
MCHC: 33.3 g/dL (ref 30.0–36.0)
MCHC: 34.6 g/dL (ref 30.0–36.0)
MCHC: 35.4 g/dL (ref 30.0–36.0)
MCV: 85.9 fL (ref 78.0–100.0)
MCV: 86.6 fL (ref 78.0–100.0)
MCV: 86.9 fL (ref 78.0–100.0)
MCV: 88.2 fL (ref 78.0–100.0)
MCV: 89.2 fL (ref 78.0–100.0)
MCV: 90.8 fL (ref 78.0–100.0)
MCV: 92.4 fL (ref 78.0–100.0)
MCV: 92.8 fL (ref 78.0–100.0)
MCV: 92.8 fL (ref 78.0–100.0)
MCV: 92.9 fL (ref 78.0–100.0)
Platelets: 100 10*3/uL — ABNORMAL LOW (ref 150–400)
Platelets: 101 10*3/uL — ABNORMAL LOW (ref 150–400)
Platelets: 109 10*3/uL — ABNORMAL LOW (ref 150–400)
Platelets: 113 10*3/uL — ABNORMAL LOW (ref 150–400)
Platelets: 117 10*3/uL — ABNORMAL LOW (ref 150–400)
Platelets: 152 10*3/uL (ref 150–400)
Platelets: 199 10*3/uL (ref 150–400)
Platelets: 36 10*3/uL — ABNORMAL LOW (ref 150–400)
Platelets: 43 10*3/uL — ABNORMAL LOW (ref 150–400)
Platelets: 65 10*3/uL — ABNORMAL LOW (ref 150–400)
Platelets: 85 10*3/uL — ABNORMAL LOW (ref 150–400)
Platelets: 99 10*3/uL — ABNORMAL LOW (ref 150–400)
RBC: 2.75 MIL/uL — ABNORMAL LOW (ref 3.87–5.11)
RBC: 2.92 MIL/uL — ABNORMAL LOW (ref 3.87–5.11)
RBC: 2.98 MIL/uL — ABNORMAL LOW (ref 3.87–5.11)
RBC: 3.07 MIL/uL — ABNORMAL LOW (ref 3.87–5.11)
RBC: 3.13 MIL/uL — ABNORMAL LOW (ref 3.87–5.11)
RBC: 3.34 MIL/uL — ABNORMAL LOW (ref 3.87–5.11)
RBC: 4.15 MIL/uL (ref 3.87–5.11)
RDW: 12.2 % (ref 11.5–15.5)
RDW: 12.3 % (ref 11.5–15.5)
RDW: 13 % (ref 11.5–15.5)
RDW: 13 % (ref 11.5–15.5)
RDW: 13.2 % (ref 11.5–15.5)
RDW: 13.2 % (ref 11.5–15.5)
RDW: 13.3 % (ref 11.5–15.5)
RDW: 13.7 % (ref 11.5–15.5)
RDW: 14.4 % (ref 11.5–15.5)
RDW: 14.8 % (ref 11.5–15.5)
RDW: 15 % (ref 11.5–15.5)
RDW: 15.1 % (ref 11.5–15.5)
WBC: 3.1 10*3/uL — ABNORMAL LOW (ref 4.0–10.5)
WBC: 3.1 10*3/uL — ABNORMAL LOW (ref 4.0–10.5)
WBC: 3.4 10*3/uL — ABNORMAL LOW (ref 4.0–10.5)
WBC: 3.9 10*3/uL — ABNORMAL LOW (ref 4.0–10.5)
WBC: 4 10*3/uL (ref 4.0–10.5)
WBC: 4.3 10*3/uL (ref 4.0–10.5)
WBC: 4.8 10*3/uL (ref 4.0–10.5)
WBC: 5.6 10*3/uL (ref 4.0–10.5)
WBC: 6.6 10*3/uL (ref 4.0–10.5)
WBC: <0.4 10*3/uL — CL (ref 4.0–10.5)

## 2010-05-01 LAB — PHOSPHORUS
Phosphorus: 2.1 mg/dL — ABNORMAL LOW (ref 2.3–4.6)
Phosphorus: 2.1 mg/dL — ABNORMAL LOW (ref 2.3–4.6)
Phosphorus: 2.2 mg/dL — ABNORMAL LOW (ref 2.3–4.6)

## 2010-05-01 LAB — DIFFERENTIAL
Basophils Absolute: 0 10*3/uL (ref 0.0–0.1)
Basophils Absolute: 0 10*3/uL (ref 0.0–0.1)
Basophils Relative: 0 % (ref 0–1)
Eosinophils Absolute: 0 10*3/uL (ref 0.0–0.7)
Eosinophils Absolute: 0 10*3/uL (ref 0.0–0.7)
Eosinophils Absolute: 0.1 10*3/uL (ref 0.0–0.7)
Eosinophils Relative: 1 % (ref 0–5)
Eosinophils Relative: 17 % — ABNORMAL HIGH (ref 0–5)
Eosinophils Relative: 3 % (ref 0–5)
Lymphocytes Relative: 12 % (ref 12–46)
Lymphs Abs: 0.1 10*3/uL — ABNORMAL LOW (ref 0.7–4.0)
Lymphs Abs: 0.6 10*3/uL — ABNORMAL LOW (ref 0.7–4.0)
Monocytes Absolute: 0 10*3/uL — ABNORMAL LOW (ref 0.1–1.0)
Monocytes Absolute: 0.1 10*3/uL (ref 0.1–1.0)
Monocytes Relative: 11 % (ref 3–12)
Monocytes Relative: 5 % (ref 3–12)
Neutrophils Relative %: 80 % — ABNORMAL HIGH (ref 43–77)

## 2010-05-01 LAB — HAPTOGLOBIN: Haptoglobin: <6 mg/dL — ABNORMAL LOW (ref 16–200)

## 2010-05-01 LAB — TYPE AND SCREEN: ABO/RH(D): O POS

## 2010-05-01 LAB — CORTISOL: Cortisol, Plasma: 7.3 ug/dL

## 2010-05-01 LAB — PROTIME-INR
INR: 1.06 (ref 0.00–1.49)
INR: 1.33 (ref 0.00–1.49)
INR: 1.41 (ref 0.00–1.49)
INR: 1.48 (ref 0.00–1.49)
INR: 1.69 — ABNORMAL HIGH (ref 0.00–1.49)
INR: 2.08 — ABNORMAL HIGH (ref 0.00–1.49)
INR: 2.11 — ABNORMAL HIGH (ref 0.00–1.49)
INR: 2.33 — ABNORMAL HIGH (ref 0.00–1.49)
INR: 2.81 — ABNORMAL HIGH (ref 0.00–1.49)
Prothrombin Time: 24 seconds — ABNORMAL HIGH (ref 11.6–15.2)
Prothrombin Time: 24.1 seconds — ABNORMAL HIGH (ref 11.6–15.2)
Prothrombin Time: 32.2 seconds — ABNORMAL HIGH (ref 11.6–15.2)
Prothrombin Time: 23.5 seconds — ABNORMAL HIGH (ref 11.6–15.2)
Prothrombin Time: 23.8 seconds — ABNORMAL HIGH (ref 11.6–15.2)
Prothrombin Time: 25 seconds — ABNORMAL HIGH (ref 11.6–15.2)
Prothrombin Time: 25.7 seconds — ABNORMAL HIGH (ref 11.6–15.2)
Prothrombin Time: 29.7 seconds — ABNORMAL HIGH (ref 11.6–15.2)

## 2010-05-01 LAB — HEPARIN LEVEL (UNFRACTIONATED)
Heparin Unfractionated: 0.28 IU/mL — ABNORMAL LOW (ref 0.30–0.70)
Heparin Unfractionated: 0.42 IU/mL (ref 0.30–0.70)
Heparin Unfractionated: 0.48 IU/mL (ref 0.30–0.70)

## 2010-05-01 LAB — MAGNESIUM
Magnesium: 1.8 mg/dL (ref 1.5–2.5)
Magnesium: 1.8 mg/dL (ref 1.5–2.5)
Magnesium: 1.9 mg/dL (ref 1.5–2.5)
Magnesium: 1.9 mg/dL (ref 1.5–2.5)
Magnesium: 2.6 mg/dL — ABNORMAL HIGH (ref 1.5–2.5)

## 2010-05-01 LAB — URINALYSIS, ROUTINE W REFLEX MICROSCOPIC
Bilirubin Urine: NEGATIVE
Glucose, UA: NEGATIVE mg/dL
Glucose, UA: NEGATIVE mg/dL
Ketones, ur: NEGATIVE mg/dL
Ketones, ur: NEGATIVE mg/dL
Specific Gravity, Urine: 1.013 (ref 1.005–1.030)
pH: 6 (ref 5.0–8.0)
pH: 6.5 (ref 5.0–8.0)

## 2010-05-01 LAB — CULTURE, BLOOD (ROUTINE X 2)
Culture: NO GROWTH
Culture: NO GROWTH
Culture: NO GROWTH

## 2010-05-01 LAB — PROTEIN ELECTROPH W RFLX QUANT IMMUNOGLOBULINS
M-Spike, %: NOT DETECTED g/dL
Total Protein ELP: 6.9 g/dL (ref 6.0–8.3)

## 2010-05-01 LAB — RETICULOCYTES
RBC.: 3.44 MIL/uL — ABNORMAL LOW (ref 3.87–5.11)
Retic Count, Absolute: 37.8 10*3/uL (ref 19.0–186.0)

## 2010-05-01 LAB — URINE MICROSCOPIC-ADD ON

## 2010-05-01 LAB — CROSSMATCH
ABO/RH(D): O POS
Antibody Screen: NEGATIVE

## 2010-05-01 LAB — TSH
TSH: 3.05 u[IU]/mL (ref 0.350–4.500)
TSH: 4.722 u[IU]/mL — ABNORMAL HIGH (ref 0.350–4.500)

## 2010-05-01 LAB — LIPID PANEL
HDL: 40 mg/dL (ref >39)
Total CHOL/HDL Ratio: 3.2 RATIO
Triglycerides: 71 mg/dL (ref <150)
VLDL: 14 mg/dL (ref 0–40)

## 2010-05-01 LAB — SURGICAL PCR SCREEN: Staphylococcus aureus: NEGATIVE

## 2010-05-01 LAB — VITAMIN B12: Vitamin B-12: 291 pg/mL (ref 211–911)

## 2010-05-01 LAB — POTASSIUM: Potassium: 2.9 mEq/L — ABNORMAL LOW (ref 3.5–5.1)

## 2010-05-01 LAB — LACTIC ACID, PLASMA: Lactic Acid, Venous: 1.3 mmol/L (ref 0.5–2.2)

## 2010-05-01 LAB — AMYLASE: Amylase: 51 U/L (ref 0–105)

## 2010-05-01 LAB — VANCOMYCIN, TROUGH: Vancomycin Tr: 15.3 ug/mL (ref 10.0–20.0)

## 2010-05-01 LAB — APTT: aPTT: 46 seconds — ABNORMAL HIGH (ref 24–37)

## 2010-05-01 NOTE — Progress Notes (Signed)
[name], [place] NO.:  [id number]  MEDICAL RECORD NO.:  [id number]          PATIENT TYPE:  INP  LOCATION:  IC09                          FACILITY:  [facility]  PHYSICIAN:  Ladona Horns. Nizhoni Parlow, MD  DATE OF BIRTH:  02/28/1970  DATE OF PROCEDURE:  03/14/2010 DATE OF DISCHARGE:                                PROGRESS NOTE   DIAGNOSIS:  This is a 40 year old Caucasian female who has left-sided breast cancer, biopsy-proven disease.  She also has systemic lupus erythematosus.  CURRENT THERAPY:  The patient is status post 2 cycles of epirubicin 166 mg, 5-FU 830 mg, and Cytoxan 830 mg.  She was last treated on March 03, 2010.  After cycle #1 of chemotherapy, the patient was found to be septic.  She was transferred to [facility] and found to have an abnormal-appearing gallbladder.  She underwent a cholecystectomy by Dr. Johna Sheriff on Friday, February 18, 2011.  After cycle #2 of chemotherapy, she was again admitted to the hospital with what appears to be an infected Port-A-Cath.  She is status post Port-A-Cath removal.  SUBJECTIVE:  The patient denies any complaints.  She tells me about her IVIG infusional reaction that she experienced yesterday.  She admits that she is moving her bowels.  She illustrates that the multiple erythematous spots on her abdomen are changing.  OBJECTIVE:  VITALS:  Vitals obtained on March 13, 2010, at 1815 hours revealed a temperature of 99.4, pulse 86, respirations 22, blood pressure 110/50. GENERAL:  The patient awake in bed.  She is pleasant.  She is alert and oriented x3.  She does not appear to be in any acute distress. HEENT:  Atraumatic, normocephalic.  Anicteric sclerae.  No thrush visualized on oral examination.  No oral lesions appreciated. NECK:  No carotid bruits appreciated bilaterally.  Trachea is midline. Neck is supple. CARDIAC:  Regular rate and rhythm without murmur, rub, or gallop.  No S3 or S4 appreciated. LUNGS:   Clear to auscultation bilaterally without wheezes, rales, or rhonchi.  No accessory muscle use appreciated. ABDOMEN:  Positive bowel sounds in all 4 quadrants.  Two to four erythematous spots located in right lower quadrant and left lower quadrant of abdomen.  To me, they appeared less erythematous and "angry" than when I first saw them in the clinic the day of her admission.  They appeared to be changing and taking on a purple color. EXTREMITIES:  No upper extremity or lower extremity edema appreciated bilaterally.  No erythema, heat or pain noted in lower extremities and popliteal fossas bilaterally.  Pedal pulse is 2+. LYMPHATICS:  Deferred. SKIN:  Warm and dry.  Rapid turgor. NEUROLOGIC:  No focal deficits appreciated.  The patient is alert and oriented x3.  LABORATORY DATA:  White blood cell count 8.3, hemoglobin 7.0, hematocrit 20.7, platelet 80.  Sodium 138, potassium 3.7, chloride 112, bicarbonate 22, BUN 2, creatinine 0.82, glucose 82.  Magnesium 1.5.  ASSESSMENT: 1. Port-A-Cath infection, on antibiotic therapy. 2. Clostridium difficile colitis, on antibiotic therapy. 3. Neutropenia, status post Neupogen and resolved. 4. Anemia, IVIG given yesterday and scheduled to be given today. 5.  Systemic lupus erythematosus. 6. Breast cancer, status post chemotherapy containing 166 mg of     epirubicin, 830 mg of 5-FU, 830 mg of Cytoxan.  This was last     administered on March 03, 2010.  It was followed by 6 mg of     Neulasta.  PLAN:  The plan is as follows: 1. Continue with antibiotic therapy as scene fit. 2. Continue with IVIG as planned today. 3. We will continue to follow the patient and assist as deemed fit.  All questions were answered.  The patient knows to call the clinic with any problems, questions, or concerns.  The patient and plan discussed with Dr. Mariel Sleet, and he is in agreement with the aforementioned.    ______________________________ Dellis Anes,  PA   ______________________________ Ladona Horns. Mariel Sleet, MD    TK/MEDQ  D:  03/14/2010  T:  03/15/2010  Job:  [id number]  Electronically Signed by Dellis Anes III PA on 03/22/2010 01:38:49 PM Electronically Signed by Glenford Peers MD on 05/01/2010 04:47:33 PM

## 2010-05-02 LAB — RETICULOCYTES: Retic Ct Pct: 2.2 % (ref 0.4–3.1)

## 2010-05-02 LAB — CBC
HCT: 35.5 % — ABNORMAL LOW (ref 36.0–46.0)
Hemoglobin: 12.3 g/dL (ref 12.0–15.0)
MCH: 31.8 pg (ref 26.0–34.0)
MCHC: 34.7 g/dL (ref 30.0–36.0)
MCV: 91.7 fL (ref 78.0–100.0)
RBC: 3.88 MIL/uL (ref 3.87–5.11)

## 2010-05-02 LAB — COMPREHENSIVE METABOLIC PANEL
ALT: 20 U/L (ref 0–35)
AST: 23 U/L (ref 0–37)
Alkaline Phosphatase: 56 U/L (ref 39–117)
CO2: 30 mEq/L (ref 19–32)
Calcium: 9.2 mg/dL (ref 8.4–10.5)
Chloride: 101 mEq/L (ref 96–112)
GFR calc Af Amer: >60 mL/min (ref >60)
GFR calc non Af Amer: >60 mL/min (ref >60)
Glucose, Bld: 105 mg/dL — ABNORMAL HIGH (ref 70–99)
Potassium: 3.3 mEq/L — ABNORMAL LOW (ref 3.5–5.1)
Sodium: 138 mEq/L (ref 135–145)
Total Bilirubin: 0.7 mg/dL (ref 0.3–1.2)

## 2010-05-02 NOTE — H&P (Signed)
[name], TORTORELLI NO.:  [id number]  MEDICAL RECORD NO.:  [id number]           PATIENT TYPE:  I  LOCATION:  3302                         FACILITY:  [facility]  PHYSICIAN:  Erick Blinks, MD     DATE OF BIRTH:  1970-04-20  DATE OF ADMISSION:  04/10/2010 DATE OF DISCHARGE:                             HISTORY & PHYSICAL   PRIMARY CARE PHYSICIAN:  Corrie Mckusick, M.D.  ONCOLOGIST:  Ladona Horns. Neijstrom, MD.  CHIEF COMPLAINT:  Worsening lower extremity weakness.  HISTORY OF PRESENT ILLNESS:  This is a 40 year old female with past medical history of systemic lupus erythematosus, history of mitral valve replacement, on Coumadin as well as recent diagnosis of breast cancer with last chemotherapy approximately 1 month ago.  The patient was recently in the hospital and discharged approximately 10 days ago from [facility] and after she was treated for a presumed line sepsis from her Port-A-Cath.  She was placed on empiric antibiotics.  Her course was complicated by a C diff infection which was treated with p.o. vancomycin and it has since resolved.  During her hospital stay, she was having recurrent fevers which was extensively worked up with no clear source. Echocardiogram as well as transesophageal echocardiogram did not show any signs of vegetation.  The patient was no longer having any diarrhea. She had no urinary tract infection.  No clear source of infection was identified.  The patient even had fluid around her cholecystectomy pouch aspirated, which was also negative for any signs of infection.  She was discharged home at that point.  She reports that since she was discharged home, for the past 10 days she has been having difficulty with lower extremity weakness.  This started as numbness and paresthesias in her feet bilaterally.  She also had aching pain in her legs bilaterally with associated weakness bilaterally.  She says that this has been progressively with  ascending.  She has difficulty climbing stairs, otherwise she does not have any complaints in her upper extremity.  She does not have any changes in her vision, double vision. She does not have any difficulty swallowing.  No difficulty with urination.  She does not have any constipation or diarrhea.  She says she still is having low-grade fevers, but not as frequently as she was when she was in the hospital.  She does not have any cough, shortness of breath, chest pain, palpitations, abdominal pain.  She has chronic nausea and vomiting, which she treats with Phenergan.  The patient was initially evaluated today at her oncologist's office.  With these findings, there was concern for possible Guillain-Barre syndrome.  The case was discussed with Dr. Sandria Manly from Neurology, and the patient was transferred to [facility] for further management.  PAST MEDICAL HISTORY: 1. Systemic lupus erythematosus. 2. Recent diagnosis of breast cancer, on chemotherapy.  The patient     will need to be evaluated by Dr. Ezzard Standing from general surgery for    possible mastectomy. 3. Autoimmune hemolytic anemia. 4. Obesity. 5. Hypothyroidism. 6. History of mitral valve replacement, on Coumadin.  ALLERGIES:  No known drug allergies.  MEDICATIONS PRIOR TO ADMISSION: 1. Coumadin 7.5 mg daily. 2. Potassium chloride 20 mEq daily. 3. Plaquenil 200 mg b.i.d. 4. Levothyroxine 25 mcg daily.  FAMILY HISTORY:  Positive for hypertension.  SOCIAL HISTORY:  The patient does not smoke, does not drink alcohol, and does not use illicit drugs.  REVIEW OF SYSTEMS:  As per HPI.  PHYSICAL EXAMINATION:  VITAL SIGNS:  Temperature of 98.2, heart rate of 98, blood pressure 104/84, pulse ox of 100% on room air, and respirations 20.  GENERAL:  The patient is in no acute distress, lying comfortably in bed. HEENT:  Normocephalic, atraumatic.  Pupils are equal, round, reactive to light.  Extraocular motions are intact.  There is a  diffuse erythematous rash on her face, which is chronic from her lupus. NECK:  Supple. CHEST:  Clear to auscultation bilaterally. CARDIAC:  S1 and S2 with a regular rate and rhythm. ABDOMEN:  Soft, nontender.  Bowel sounds are active. EXTREMITIES:  No cyanosis, clubbing, or edema. NEUROLOGIC:  The patient has 5/5 strength in her upper extremities.  She has 4/5 strength in her lower extremities bilaterally.  Her reflexes in her knees are 2+, and her plantars are downgoing.  Cranial nerves II-XII are grossly intact.  There is no facial asymmetry.  SKIN:  Warm.  LABORATORY DATA:  The patient has had extensive labs drawn at [facility], which are currently pending including CBC, BMP, TSH, ESR, CRP, CPK, and aldolase.  ASSESSMENT/PLAN: 1. 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  We have asked Dr. Vickey Huger from Neurology to see this     patient in consultation.  Dr. Daiva Eves from infectious disease was     also consulted and recommended getting an MRI of the lumbosacral     spine to rule out any abscess due to her lower extremity weakness     and recurrent fevers.  We will also get blood cultures at this time     and check a stool Campylobacter antigen.  We will follow up on the     labs that are sent to [facility] and hold off on any antibiotics at     this time especially since patient has a history of Clostridium     difficile.  We will check a urinalysis and urine culture. 2. History of mitral valve replacement.  We will ask pharmacy to dose     Coumadin and check a PT/INR at this time. 3. Hypothyroidism, she will be continued on her supplementation. 4. Lupus.  We will continue her on Plaquenil for now. 5. History of breast cancer.  She will follow up with Dr. Mariel Sleet     for further management.  There is concern that her current     presentation may be a perineoplastic syndrome.  We will await     Neurology recommendations. 6. The patient is a full  code.  Further orders will be per the     clinical course.     Erick Blinks, MD     JM/MEDQ  D:  04/10/2010  T:  04/11/2010  Job:  [id number]  cc:   Ladona Horns. Mariel Sleet, MD Corrie Mckusick, M.D.  Electronically Signed by Erick Blinks  on 05/02/2010 10:30:37 PM

## 2010-05-02 NOTE — Consult Note (Signed)
Summary: [facility] Consultation Report  [facility] Consultation Report   Imported By: Earl Many 04/27/2010 19:22:11  _____________________________________________________________________  External Attachment:    Type:   Image     Comment:   External Document

## 2010-05-05 ENCOUNTER — Inpatient Hospital Stay (HOSPITAL_COMMUNITY)
Admission: RE | Admit: 2010-05-05 | Discharge: 2010-05-08 | DRG: 391 | Disposition: A | Discharge status: Home Health | Payer: PRIVATE HEALTH INSURANCE | Source: Ambulatory Visit | Attending: Internal Medicine | Admitting: Internal Medicine

## 2010-05-05 ENCOUNTER — Inpatient Hospital Stay (HOSPITAL_COMMUNITY): Payer: PRIVATE HEALTH INSURANCE

## 2010-05-05 ENCOUNTER — Ambulatory Visit (HOSPITAL_COMMUNITY): Payer: PRIVATE HEALTH INSURANCE | Admitting: Oncology

## 2010-05-05 DIAGNOSIS — A088 Other specified intestinal infections: Principal | ICD-10-CM | POA: Diagnosis present

## 2010-05-05 DIAGNOSIS — E039 Hypothyroidism, unspecified: Secondary | ICD-10-CM | POA: Diagnosis present

## 2010-05-05 DIAGNOSIS — R509 Fever, unspecified: Secondary | ICD-10-CM

## 2010-05-05 DIAGNOSIS — C50919 Malignant neoplasm of unspecified site of unspecified female breast: Secondary | ICD-10-CM | POA: Diagnosis present

## 2010-05-05 DIAGNOSIS — E860 Dehydration: Secondary | ICD-10-CM

## 2010-05-05 DIAGNOSIS — K219 Gastro-esophageal reflux disease without esophagitis: Secondary | ICD-10-CM | POA: Diagnosis present

## 2010-05-05 DIAGNOSIS — Z954 Presence of other heart-valve replacement: Secondary | ICD-10-CM

## 2010-05-05 DIAGNOSIS — E876 Hypokalemia: Secondary | ICD-10-CM | POA: Diagnosis present

## 2010-05-05 DIAGNOSIS — M329 Systemic lupus erythematosus, unspecified: Secondary | ICD-10-CM | POA: Diagnosis present

## 2010-05-05 DIAGNOSIS — G620 Drug-induced polyneuropathy: Secondary | ICD-10-CM | POA: Diagnosis present

## 2010-05-05 DIAGNOSIS — T451X5A Adverse effect of antineoplastic and immunosuppressive drugs, initial encounter: Secondary | ICD-10-CM | POA: Diagnosis present

## 2010-05-05 DIAGNOSIS — K859 Acute pancreatitis without necrosis or infection, unspecified: Secondary | ICD-10-CM | POA: Diagnosis present

## 2010-05-05 LAB — DIFFERENTIAL
Basophils Relative: 0 % (ref 0–1)
Eosinophils Absolute: 0.1 10*3/uL (ref 0.0–0.7)
Eosinophils Relative: 2 % (ref 0–5)
Lymphs Abs: 2.6 10*3/uL (ref 0.7–4.0)
Neutrophils Relative %: 51 % (ref 43–77)

## 2010-05-05 LAB — CBC
MCV: 86.8 fL (ref 78.0–100.0)
Platelets: 344 10*3/uL (ref 150–400)
RBC: 3.48 MIL/uL — ABNORMAL LOW (ref 3.87–5.11)
RDW: 14.9 % (ref 11.5–15.5)
WBC: 6.5 10*3/uL (ref 4.0–10.5)

## 2010-05-05 LAB — COMPREHENSIVE METABOLIC PANEL
BUN: 11 mg/dL (ref 6–23)
CO2: 23 mEq/L (ref 19–32)
Calcium: 9.5 mg/dL (ref 8.4–10.5)
Chloride: 103 mEq/L (ref 96–112)
Creatinine, Ser: 0.79 mg/dL (ref 0.4–1.2)
GFR calc non Af Amer: >60 mL/min (ref >60)
Total Bilirubin: 0.6 mg/dL (ref 0.3–1.2)

## 2010-05-05 LAB — CLOSTRIDIUM DIFFICILE BY PCR: Toxigenic C. Difficile by PCR: NEGATIVE

## 2010-05-05 LAB — LIPASE, BLOOD: Lipase: 98 U/L — ABNORMAL HIGH (ref 11–59)

## 2010-05-05 LAB — PROTIME-INR: Prothrombin Time: 23.1 seconds — ABNORMAL HIGH (ref 11.6–15.2)

## 2010-05-05 LAB — APTT: aPTT: 64 seconds — ABNORMAL HIGH (ref 24–37)

## 2010-05-06 LAB — COMPREHENSIVE METABOLIC PANEL
AST: 43 U/L — ABNORMAL HIGH (ref 0–37)
Albumin: 2.7 g/dL — ABNORMAL LOW (ref 3.5–5.2)
Calcium: 8.8 mg/dL (ref 8.4–10.5)
Creatinine, Ser: 0.68 mg/dL (ref 0.4–1.2)
GFR calc Af Amer: >60 mL/min (ref >60)
GFR calc non Af Amer: >60 mL/min (ref >60)
Total Protein: 6.9 g/dL (ref 6.0–8.3)

## 2010-05-06 LAB — URINE MICROSCOPIC-ADD ON

## 2010-05-06 LAB — DIFFERENTIAL
Basophils Absolute: 0 10*3/uL (ref 0.0–0.1)
Basophils Relative: 1 % (ref 0–1)
Eosinophils Absolute: 0.2 10*3/uL (ref 0.0–0.7)
Eosinophils Relative: 4 % (ref 0–5)
Lymphocytes Relative: 41 % (ref 12–46)
Lymphs Abs: 1.9 10*3/uL (ref 0.7–4.0)
Monocytes Absolute: 0.4 10*3/uL (ref 0.1–1.0)
Monocytes Absolute: 0.3 10*3/uL (ref 0.1–1.0)
Monocytes Relative: 7 % (ref 3–12)
Monocytes Relative: 5 % (ref 3–12)

## 2010-05-06 LAB — FERRITIN: Ferritin: 182 ng/mL (ref 10–291)

## 2010-05-06 LAB — URINALYSIS, ROUTINE W REFLEX MICROSCOPIC
Hgb urine dipstick: NEGATIVE
Specific Gravity, Urine: >1.03 — ABNORMAL HIGH (ref 1.005–1.030)
Urobilinogen, UA: 0.2 mg/dL (ref 0.0–1.0)

## 2010-05-06 LAB — CBC
HCT: 27.2 % — ABNORMAL LOW (ref 36.0–46.0)
HCT: 35.7 % — ABNORMAL LOW (ref 36.0–46.0)
Hemoglobin: 9.1 g/dL — ABNORMAL LOW (ref 12.0–15.0)
Hemoglobin: 12.5 g/dL (ref 12.0–15.0)
MCH: 32.2 pg (ref 26.0–34.0)
MCHC: 34.9 g/dL (ref 30.0–36.0)
MCV: 89.2 fL (ref 78.0–100.0)
RDW: 15.2 % (ref 11.5–15.5)
RDW: 13.1 % (ref 11.5–15.5)
WBC: 4.7 10*3/uL (ref 4.0–10.5)

## 2010-05-06 LAB — IRON AND TIBC
Iron: 62 ug/dL (ref 42–135)
Saturation Ratios: 22 % (ref 20–55)
TIBC: 285 ug/dL (ref 250–470)
UIBC: 223 ug/dL

## 2010-05-06 LAB — PROTIME-INR
INR: 2.38 — ABNORMAL HIGH (ref 0.00–1.49)
Prothrombin Time: 26.1 seconds — ABNORMAL HIGH (ref 11.6–15.2)

## 2010-05-06 LAB — FOLATE: Folate: 7.9 ng/mL

## 2010-05-06 NOTE — H&P (Signed)
NAMESUEZETTE, LAFAVE NO.:  [id number]  MEDICAL RECORD NO.:  [id number]           PATIENT TYPE:  I  LOCATION:  A331                          FACILITY:  [facility]  PHYSICIAN:  Elliot Cousin, M.D.    DATE OF BIRTH:  [birth year]  DATE OF ADMISSION:  05/05/2010 DATE OF DISCHARGE:  LH                             HISTORY & PHYSICAL   PRIMARY CARE PHYSICIAN:  Corrie Mckusick, MD  PRIMARY ONCOLOGIST:  Ladona Horns. Mariel Sleet, MD  PRIMARY SURGEON:  Sandria Bales. Ezzard Standing, MD  PRIMARY RHEUMATOLOGIST:  Zenovia Jordan, MD  CHIEF COMPLAINT:  Nausea, vomiting, diarrhea, fever, and chills.  HISTORY OF PRESENT ILLNESS:  The patient is a 40 year old woman with a past medical history significant for left-sided breast cancer, status post left mastectomy on April 20, 2010, recent diagnosis of severe peripheral neuropathy, systemic lupus erythematosus, mitral valve replacement with St. Jude Mechanical  valve, and recent Port-A- Cath infection, who presents as a direct admission from Dr. Thornton Papas office with a chief complaint of diarrhea, nausea, and vomiting.  She was just discharged from the hospital on April 29, 2010, following left mastectomy and evaluation of severe peripheral neuropathy.  She was in her usual state of health until approximately 2-3 days ago.  At that time, she developed fever and chills.  Over the last couple of days, her fever has been ranging from 102-103.5.  She saw general surgeon, Dr. Amie Critchley 2 days ago who evaluated her mastectomy operative site.  He aspirated some fluid and according to the patient, he said that it looked fine.  He started her on Cipro empirically, although there was no obvious infection.  She did take one dose of Cipro, but following the second dose, she developed nausea and vomiting.  Over the past 2 days, she has vomited 5 to 6 times.  She had approximately 5 loose or watery stools yesterday and two to three today.  In fact when she urinates  and presses down, she is incontinent of the watery stools.  She denies coffee-ground emesis or bright red blood emesis.  She denies black tarry stools or bright red blood per rectum.  She denies abdominal pain or cramping.  She has no pain with urination.  She denies any recent upper respiratory infection symptoms.  She has not had a cough.  She has not eaten a solid meal in 2 days.  She has not been able to keep any of her medicines down.  PAST MEDICAL HISTORY: 1. Breast cancer, status post left simple mastectomy with left     axillary sentinel node biopsy on April 20, 2010, by Dr. Ovidio Kin.  The patient was diagnosed with breast cancer in December     2011.  She underwent two cycles of chemotherapy in January 2012. 2. Rheumatic heart disease, status post mitral valve replacement with     a St. Jude valve in March 2008.  She is chronically anticoagulated     with Coumadin. 3. Systemic lupus erythematosus with Raynaud phenomenon. 4. Recent diagnosis of severe peripheral neuropathy of unknown clear     etiology  2 weeks ago. 5. The patient underwent an extensive evaluation including a nerve     conduction study on April 12, 2010, which yielded a positive     axonal neuropathy.  West Nile virus antibody was negative.     Immunoglobulin panel was negative.  CD-4 count was normal.  HIV was     nonreactive. 6. Recent hospitalization at [facility] from January through     the first part of February 2012 for an infected Port-A-Cath, status     post removal by Dr. Lovell Sheehan in January 2012.  The patient continued     to have persistent fever.  TEE was negative.  She was treated with     multiple antibiotics. 7. C. diff colitis during the January 2012 to February 2012     hospitalization at [facility]. 8. Septic shock secondary to acute cholecystitis in December 2011,     status post laparoscopic cholecystectomy on February 17, 2010. 9. History of hemolytic  anemia. 10.History of pancytopenia secondary to chemotherapy. 11.Hypothyroidism. 12.Gastroesophageal reflux disease.  MEDICATIONS: 1. Coumadin 5 mg tablets.  The patient takes 2 tablets alternating     with 2-1/2 tablets daily (10 mg alternating with 10-1/2 mg daily). 2. Neurontin 400 mg in the morning and 600 mg at bedtime. 3. Vitamin B12 of 1000 mcg every 4 months. 4. Flexeril 10 mg b.i.d. as needed for muscle spasms. 5. Pepcid 20 mg b.i.d. 6. Folic acid 1 mg daily. 7. Hydrocodone 5/325 mg 1 tablet every 4 hours as needed for pain. 8. Lasix 20 mg daily as needed for fluid retention. 9. Phenergan 25 mg half a tablet to 1 tablet every 6 hours as needed     for nausea. 10.Plaquenil 200 mg b.i.d. 11.Potassium 20 mg daily. 12.Synthroid 25 mg daily.  ALLERGIES:  No known drug allergies.  SOCIAL HISTORY:  The patient is separated.  [place]  She has two children.  She is now disabled.  She was previously employed as a Forensic scientist.  She denies tobacco, alcohol, and illicit drug use.  FAMILY HISTORY:  Her mother is in her 21s.  She has a history of back surgery.  Her father is in his 72s.  He has hypertension. REVIEW OF SYSTEMS:  As above in history present illness.  In addition, the patient's review of systems is positive for generalized weakness in her hands and legs.  Also, she has numbness and tingling in her hands and her feet.  Otherwise review of systems is negative.  PHYSICAL EXAM:  VITAL SIGNS:  Temperature 98.7, pulse 97, respiratory rate 21, oxygen saturation 100% on room air, blood pressure 111/78. GENERAL:  The patient is a pleasant overweight 40 year old Caucasian woman, who is currently sitting up in bed, in no acute distress. HEENT:  Head is normocephalic nontraumatic.  Pupils are equal, round, and reactive to light.  Extraocular movements are intact.  Conjunctivae are clear.  Sclerae are white.  Tympanic membranes not examined.   Nasal mucosa is dry.  No sinus tenderness.  Oropharynx reveals mildly dry mucous membranes.  No posterior exudates or erythema. NECK:  Supple.  No adenopathy, no thyromegaly, no bruit, no JVD. LUNGS:  Slightly decreased breath sounds in the bases, otherwise clear. HEART:  S1 and S2 with an S2 click. CHEST WALL:   Steri-Strips on the mastectomy site on the left. There is some mild edema just lateral to the mastectomy site.  No obvious drainage, erythema, or exquisite  tenderness.  The right chest wall has a scar from the former Port-A-Cath placement.  Nontender. ABDOMEN:  Obese, positive bowel sounds, soft, mildly tender in the hypogastrium, otherwise no distention.  No masses palpated.  No hepatosplenomegaly.  No guarding. GU/RECTAL:  Deferred. EXTREMITIES:  Pedal pulses are palpable bilaterally.  No pretibial edema and no pedal edema.  No acute hot red joints.  NEUROLOGIC:  The patient is alert and oriented x3.  Cranial nerves II through XII are intact. Strength is globally 5-/5.  Sensation is grossly decreased distally. PSYCHOLOGICAL:  The patient is pleasant.  She is alert.  Her speech is clear.  ADMISSION LABORATORIES:  Currently pending.  ASSESSMENT: 1. Nausea, vomiting, and diarrhea.  The patient does have a history of     Clostridium difficile colitis.  We will certainly need to rule this     out.  She may have a viral gastroenteritis namely Norovirus. 2. Concomitant fever and chills.  We will need to rule out pneumonia     and an urinary tract infection.  She is currently afebrile,     although she has reported fever at home. 3. History of breast cancer, status post mastectomy on April 20, 2010.     The operative site does not appear to be infected. 4. Rheumatic valvular heart disease, status post mitral valve     replacement with St. Jude valve.  The patient is chronically     anticoagulated. 5. Hypothyroidism. 6. Recent diagnosis of severe peripheral  neuropathy.  PLAN: 1. Dr. Mariel Sleet did order IV fluids, blood cultures, urine culture,     stool culture, CBC, and CMET.  We will add C. diff PCR, urinalysis,     and acute abdominal series.  We will also assess for acute     pancreatitis with a lipase and amylase. 2. P.r.n. Phenergan, Zofran, and Ativan intravenously as needed for     nausea and vomiting. 3. We will start Flagyl IV empirically. 4. We will allow sips of clear liquids and ice chips, but we will     limit p.o. medications overnight. 5. We will check a PT and PTT given that she is chronically     anticoagulated.     Elliot Cousin, M.D.     DF/MEDQ  D:  05/05/2010  T:  05/05/2010  Job:  [id number]  cc:   Corrie Mckusick, M.D. Fax: [phone]  Ladona Horns. Mariel Sleet, MD Fax: [phone]  Sandria Bales. Ezzard Standing, M.D. [address] [place] [address]  Zenovia Jordan, MD Fax: [phone]  Electronically Signed by Elliot Cousin M.D. on 05/06/2010 05:40:11 PM

## 2010-05-07 LAB — DIFFERENTIAL
Basophils Absolute: 0.1 10*3/uL (ref 0.0–0.1)
Eosinophils Relative: 7 % — ABNORMAL HIGH (ref 0–5)
Lymphocytes Relative: 38 % (ref 12–46)
Lymphs Abs: 2 10*3/uL (ref 0.7–4.0)
Monocytes Absolute: 0.3 10*3/uL (ref 0.1–1.0)
Monocytes Relative: 6 % (ref 3–12)
Neutro Abs: 2.4 10*3/uL (ref 1.7–7.7)

## 2010-05-07 LAB — CBC
HCT: 25 % — ABNORMAL LOW (ref 36.0–46.0)
Hemoglobin: 8.2 g/dL — ABNORMAL LOW (ref 12.0–15.0)
MCH: 29.4 pg (ref 26.0–34.0)
MCHC: 32.8 g/dL (ref 30.0–36.0)
MCV: 89.6 fL (ref 78.0–100.0)

## 2010-05-07 LAB — PROTIME-INR: INR: 3.08 — ABNORMAL HIGH (ref 0.00–1.49)

## 2010-05-07 LAB — COMPREHENSIVE METABOLIC PANEL
Albumin: 2.4 g/dL — ABNORMAL LOW (ref 3.5–5.2)
Alkaline Phosphatase: 112 U/L (ref 39–117)
BUN: 5 mg/dL — ABNORMAL LOW (ref 6–23)
CO2: 20 mEq/L (ref 19–32)
Chloride: 110 mEq/L (ref 96–112)
GFR calc non Af Amer: >60 mL/min (ref >60)
Potassium: 3.8 mEq/L (ref 3.5–5.1)
Total Bilirubin: 0.3 mg/dL (ref 0.3–1.2)

## 2010-05-07 LAB — URINE CULTURE
Colony Count: NO GROWTH
Culture: NO GROWTH

## 2010-05-07 LAB — MAGNESIUM: Magnesium: 1.7 mg/dL (ref 1.5–2.5)

## 2010-05-08 LAB — MAGNESIUM: Magnesium: 1.9 mg/dL (ref 1.5–2.5)

## 2010-05-08 LAB — BASIC METABOLIC PANEL
BUN: 3 mg/dL — ABNORMAL LOW (ref 6–23)
Creatinine, Ser: 0.61 mg/dL (ref 0.4–1.2)
GFR calc non Af Amer: >60 mL/min (ref >60)
Potassium: 4.4 mEq/L (ref 3.5–5.1)

## 2010-05-08 LAB — PROTIME-INR
INR: 2.5 — ABNORMAL HIGH (ref 0.00–1.49)
Prothrombin Time: 27.1 seconds — ABNORMAL HIGH (ref 11.6–15.2)

## 2010-05-09 NOTE — Op Note (Signed)
[name], [name] NO.:  [id number]  MEDICAL RECORD NO.:  [id number]           PATIENT TYPE:  LOCATION:                                 FACILITY:  PHYSICIAN:  Sandria Bales. Ezzard Standing, M.D.  DATE OF BIRTH:  [birth year]  DATE OF PROCEDURE:  04/20/2010                              OPERATIVE REPORT  PREOPERATIVE DIAGNOSIS:  Left breast carcinoma.  POSTOPERATIVE DIAGNOSIS:  Left breast carcinoma. (Clinically T1, N0)  PROCEDURE:  Left simple mastectomy with left axillary sentinel lymph node biopsy.  SURGEON:  Sandria Bales. Ezzard Standing, MD  FIRST ASSISTANT:  None.  ANESTHESIA:  General endotracheal.  ESTIMATED BLOOD LOSS:  100 mL.  I did leave two JP drains in place.  HISTORY OF PRESENT ILLNESS:  Ms. Cogar is a 40 year old white female who sees Dr. Assunta Found as her primary medical doctor.  She was found to have a left breast carcinoma in November 2011 that measured 3.8 cm in the 10 o'clock position of the left breast.  She underwent chemotherapy supervised by Dr. Glenford Peers, has gotten through two courses of treatment but had multiple problems including hospitalization at [facility] for sepsis, an infected right subclavian port, a laparoscopic cholecystectomy, and this hospitalization for a lower extremity neuropathy felt to be secondary to her lupus and chemotherapy.  Extensive discussion has been carried out with the patient about proceeding with mastectomy which we thought would possibly avoid the need for radiation therapy if her tumor has responded well.  I discussed with her the indication and potential complications of surgery including mastectomy and sentinel lymph node.  Potential complications include but not limited to bleeding, infection, nerve injury, recurrence of the tumor, and the need for possible more surgery.  OPERATIVE NOTE:  The patient placed in a supine position.  She had stopped her heparin 12 hours before.  Her PTT was just mildly  elevated. Her left breast had been injected with 1 mCi of 6 mL of sulfa colloid. She was then placed in the operating room.  Her left breast was prepped with ChloraPrep, sterilely draped, and I then injected her nipple with about 1 mL of 60% methylene blue.  A time-out was held and the surgical checklist run.    I then made an elliptical incision including areola and the skin of the breast.  I carried this dissection superiorly to about 1 or 2 fingerbreadths below the clavicle, medially to the lateral edge of the sternum, inferiorly to the investing fascia of the rectus abdominis muscle, and laterally towards the latisimus dorsi muscle.  When I got to the axilla, I identified the sentinel lymph node.  The node I identified had counts of about 500.  The node was blue.  It grossly looked normal.  There was no abnormal axillary adenopathy I could palpate on exam.  Therefore, I sent this for permanent Pathology.  The breast was then removed from the chest wall using primary Bovie electrocautery.  Vessels I  identified and ligated with a 3-0 Vicryl suture.  The lateral breast was marked with a long suture lateral and then get ready for closure.  I removed additional superior skin flap which I marked with a long suture inferior and a short suture lateral.  I then irrigated the chest wall with 2 L of saline.  I watched the wound for 10 minutes to make sure there was no bleeding.  I took out two Blake drains in the inferior flap.  I sewed these in place with 2-0 nylon suture.  I then closed the breast flap with a 3-0 Vicryl subcuticular suture and then a 4-0 Monocryl subcuticular suture.  I then painted the wound with tincture of benzoin and Steri-Strips.  The patient tolerated the procedure well, was transported to recovery room in good.   Sandria Bales. Ezzard Standing, M.D., FACS   DHN/MEDQ  D:  04/20/2010  T:  04/21/2010  Job:  [id number]  cc:   Corrie Mckusick, M.D. Ladona Horns. Mariel Sleet, MD Learta Codding, MD,FACC Zenovia Jordan, MD Genene Churn. Love, M.D.  Electronically Signed by Ovidio Kin M.D. on 05/09/2010 07:38:35 AM

## 2010-05-09 NOTE — Discharge Summary (Signed)
NAMELAURINDA, CARRENO NO.:  [id number]  MEDICAL RECORD NO.:  [id number]           PATIENT TYPE:  I  LOCATION:  5032                         FACILITY:  [facility]  PHYSICIAN:  Lonia Blood, M.D.       DATE OF BIRTH:  1971/01/19  DATE OF ADMISSION:  04/10/2010 DATE OF DISCHARGE:  04/29/2010                              DISCHARGE SUMMARY   PRIMARY CARE PHYSICIAN:  Corrie Mckusick, MD  PRIMARY ONCOLOGIST:  Ladona Horns. Mariel Sleet, MD  PRIMARY SURGEON:  Sandria Bales. Ezzard Standing, MD  DISCHARGE DIAGNOSES: 1. Severe peripheral neuropathy of unclear etiology, improving. 2. Breast cancer, status post bilateral mastectomy. 3. Systemic lupus erythematosus. 4. 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 5. Hypothyroidism. 6. History of autoimmune hemolytic anemia. 7. Gastroesophageal reflux disease.  DISCHARGE MEDICATIONS: 1. Coumadin 12.5 mg on Tuesday, Thursday, Saturday, and Sunday and 10     mg on Monday, Wednesday, and Friday. 2. Vitamin B12 1000 mcg every 4 months. 3. Flexeril 10 mg twice a day as needed for muscle spasms. 4. Pepcid 20 mg twice a day. 5. Folic acid 1 mg daily. 6. Neurontin 400 mg in the morning and 600 mg at bedtime. 7. Hydrocodone 5/325 one every 4 hours needed for pain. 8. Lasix 20 mg daily as needed for fluid retention. 9. Phenergan 25 mg 1/2 tablet to 1 tablet every 6 hours as needed for     nausea. 10.Plaquenil 200 mg twice a day. 11.Potassium 20 mEq daily. 12.Synthroid 25 mcg daily.  CONDITION ON DISCHARGE:  [name] was discharged in good condition. She was set up with home health physical therapy, occupational therapy, and registered nurse for dressing changes for mastectomy and for PT/INR checks.  She will follow up with Dr. Assunta Found, Dr. Glenford Peers, and Dr. Ovidio Kin.  HISTORY AND PHYSICAL:  Refer to dictated H and P done by Dr. Kerry Hough.  CONSULTATION DURING THIS ADMISSION:  The patient was  seen in consultation by Neurology and General Surgery.  PROCEDURE DURING THIS ADMISSION: 1. The patient underwent MRI of thoracic lumbar spine with and without     contrast which was negative for metastatic disease, abscess, or     diskitis. 2. Nerve conduction study on April 12, 2010, positive with axonal     neuropathy. 3. Left mastectomy with axillary sentinel lymph node biopsy on April 20, 2010, by Dr. Ovidio Kin.  HOSPITAL COURSE: 1. Lower extremity weakness:  Ms. Bobby it was admitted initially     from the emergency room on November 28, 2009, because the patient     was unable to ambulate.  Initial consultation was for possibility     Guillain-Barre syndrome.  After extensive evaluations, the     consultant neurologist felt that the patient was having an     excellent neuropathy.  Etiology of this was unclear, but     chemotherapy was a main suspicion.  In any regards Ms. Pekala's     testing including West Nile virus antibody, immunoglobulin panel,  serum protein electrophoresis, CD-4 count, and HIV antibody did not     reveal any other pathology.  The patient did improve her neuropathy     and by the time of discharge she was able to ambulate with just     minimal neuropathic-type symptoms.  Control of the symptoms is     currently done with low doses of Neurontin.  The patient was     evaluated for inpatient rehabilitation, but she was felt that she     was progressing so well that home health physical therapy and     occupation therapy was much more advised. 2. Invasive breast cancer:  Ms. Ridings underwent left mastectomy on     April 20 1010.  Results of the pathology did not show any spreading     the lymph nodes or beyond the surgical margin.  The patient will     follow up with Dr. Ovidio Kin and Dr. Glenford Peers for further     recommendations. 3. Systemic lupus erythematosus:  Currently, the patient is taking     Plaquenil daily.  Her ANA titer is  still elevated at 1:2560, but I     could not find any evidence of end-organ damage.  The patient's     white blood cell counts and platelets are within normal limits and     I feel that her anemia is due to chronic disease and multiple blood     draws in the hospital.  The patient's renal function was completely     within normal limits.  A urinalysis did not show any hematuria.  At     this point in time, I will recommend that the patient sees her     primary rheumatologist Dr. Zenovia Jordan to see if she needs any     adjustments of her treatment at all.  I think that continuing     Plaquenil it is safe for now. 4. Mitral valve replacement:  Ms. Surles had to be taken off Coumadin     for her mastectomy.  She was started on bridging heparin and postop     she was resumed on Coumadin with bridge of heparin.  Since April 20, 2010, the patient required a total of 10 days for the Coumadin to     become therapeutic.  After that, she was discharged home in good     condition on April 29, 2010.     Lonia Blood, M.D.     SL/MEDQ  D:  04/30/2010  T:  04/30/2010  Job:  [id number]  cc:   Corrie Mckusick, M.D. Ladona Horns. Mariel Sleet, MD Sandria Bales. Ezzard Standing, M.D.  Electronically Signed by Lonia Blood M.D. on 05/09/2010 11:48:16 AM

## 2010-05-10 LAB — CULTURE, BLOOD (ROUTINE X 2): Culture: NO GROWTH

## 2010-05-14 LAB — CBC
HCT: 35.9 % — ABNORMAL LOW (ref 36.0–46.0)
Hemoglobin: 12.6 g/dL (ref 12.0–15.0)
MCHC: 35.2 g/dL (ref 30.0–36.0)
MCV: 90.8 fL (ref 78.0–100.0)
RDW: 13.9 % (ref 11.5–15.5)

## 2010-05-17 ENCOUNTER — Ambulatory Visit (INDEPENDENT_AMBULATORY_CARE_PROVIDER_SITE_OTHER): Payer: PRIVATE HEALTH INSURANCE | Admitting: *Deleted

## 2010-05-17 DIAGNOSIS — I059 Rheumatic mitral valve disease, unspecified: Secondary | ICD-10-CM

## 2010-05-17 DIAGNOSIS — Z7901 Long term (current) use of anticoagulants: Secondary | ICD-10-CM | POA: Insufficient documentation

## 2010-05-17 DIAGNOSIS — Z954 Presence of other heart-valve replacement: Secondary | ICD-10-CM | POA: Insufficient documentation

## 2010-05-18 ENCOUNTER — Ambulatory Visit (INDEPENDENT_AMBULATORY_CARE_PROVIDER_SITE_OTHER): Payer: PRIVATE HEALTH INSURANCE | Admitting: *Deleted

## 2010-05-18 ENCOUNTER — Telehealth: Payer: Self-pay | Admitting: *Deleted

## 2010-05-18 DIAGNOSIS — I059 Rheumatic mitral valve disease, unspecified: Secondary | ICD-10-CM

## 2010-05-18 DIAGNOSIS — Z954 Presence of other heart-valve replacement: Secondary | ICD-10-CM

## 2010-05-18 DIAGNOSIS — Z7901 Long term (current) use of anticoagulants: Secondary | ICD-10-CM

## 2010-05-18 NOTE — Telephone Encounter (Signed)
INR 2.3 AND 28.1

## 2010-05-18 NOTE — Telephone Encounter (Signed)
See coumadin note. 

## 2010-05-18 NOTE — Consult Note (Signed)
[name], [name] NO.:  [id number]  MEDICAL RECORD NO.:  [id number]           PATIENT TYPE:  LOCATION:                                 FACILITY:  PHYSICIAN:  Melvyn Novas, M.D.  DATE OF BIRTH:  05-Dec-1970  DATE OF CONSULTATION: DATE OF DISCHARGE:                                CONSULTATION   This is a 40 year old lady admitted from [place] and transferred from Dr. Mariel Sleet on April 10, 2010, through the [facility], Dr. Mikeal Hawthorne.  CHIEF COMPLAINT:  "I cannot walk.  I am getting weaker in both legs for the last 10 days.  I have fallen twice the last time yesterday."  HISTORY OF PRESENT ILLNESS:  This 40 year old Caucasian right-handed female was admitted on March 10, 2010, with a Port-A-Cath infection after starting chemotherapy just in December 2011 for a recently diagnosed breast cancer.  She developed bradycardia and hypotension on February 12, 2011, then septic shock after cholecystitis.  She also had a reinfection of the Port-A-Cath after she initially cleared up and was able to be discharged from the hospital.  She stayed 3 weeks total in the hospital and started on March 10, 2010, on vanco.  Pancytopenia then developed.  She was given Neupogen afterwards which made her sick she states.  She again improved and was ready to be discharged when she developed Clostridium difficile infection on March 13, 2010, with high temperatures of 102 Fahrenheit and more, developed mild tachycardia and hypotension which urged a myocarditis evaluation through Dr. Daleen Squibb and Dr. Dietrich Pates.  The patient has a very long medical history.  She has lupus erythematosus which is treated on Plaquenil.  She has a history of breast cancer diagnosed in 2011.  She is status post chemotherapy and radiation therapy.  Again, she develop pancytopenia.  She developed autoimmune hemolytic anemia.  She is hypothyroid, and she had a mitral valve replacement in  2008 at [facility] [place]. Jude requires her to have chronic coumadinization  MEDICATIONS:  Coumadin, potassium chloride, Plaquenil, and thyroxine are listed.  ALLERGIES:  The patient states that she became sick after Neupogen was given at a high speed, but she could tolerate the medication when it was given to her at a lower rate.  FAMILY HISTORY:  None.  SOCIAL HISTORY:  A 40 year old disabled Caucasian right-handed female with a high school student daughter.  No tobacco user.  Nondrinker.  No illicit drug use.  REVIEW OF SYSTEMS:  Weakness, malaise, pain in a sore achy fashion below both knees towards the feet.  Feet are numb.  The patient also states that Port-A-Cath had just been removed on March 11, 2010, after she developed a line sepsis and that the wound did initially not heal well.  She is slightly short of breath but has no rhonchi or rales or wheezing. Temperature 98 degrees Fahrenheit, blood pressure is 106/74, heart rate is 103, respiratory rate is 20, O2 saturation is 98%.  The patient has a Mallampati grade 2.  She has a soft abdomen.  Neck is supple.  She has a lupoid butterfly rash in the face.  She  also has a moon face, probably from steroid use in the past.  She is alert and oriented, very pleasant, can give an eloquent recapture of her medical history which is extensive.  Tongue and uvula are midline.  The face is symmetric.  There is no sensory loss over the face or upper extremities.  She did have a mild pronator drift but no tremor and normal muscle tone.  Grip strength weakness on the left appears stronger than on the right and the patient has Raynaud syndrome with reddish bluish-appearing fingers.  She has a drift in both lower extremities when elevated antigravity.  She has a footdrop on the left.  She has areflexia in the Achilles tendon reflexes bilaterally.  She has an attenuated patellar reflex on the right and an absent patellar reflex on the  left.  Her feet feel numb to all primary modalities, yet plantar stimulation elicited a strong Babinski response. The patient could sit up unassisted but cannot stand safely and she has developed a wide-based gait and needs to brace herself she states.  Gait was deferred during this evaluation.  NUTRITIONAL STATUS:  The patient appears normally nourished.  EKG shows normal sinus rhythm with sinus tachycardia intermittently.  Lab results are still pending.  Blood was just drawn.  CT and MRI have not been obtained.  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  She has a history of 10 days of ascending weakness to falls, developed foot numbness, and has absent deep tendon reflexes.  I will refrain from the lumbar puncture in this patient due to her poor immune status, but also due to her chronic coumadinization required by a mitral valve replacement.  HIV test and West Nile test are pending.  I will order an electric protein phoresis to see if she has any IgA deficiency. I think the patient should be safe to receive IVIG.  Plasmapheresis itself would require the placement of another line and seems not to be feasible in this patient who just was treated for line sepsis.  An MRI of the thoracic spinal cord has to be ordered.  An MRI of the lumbar spinal cord has already been ordered by the Hospitalist.  The patient was informed that I will talk to Dr. Sandria Manly about her condition and treatment and that I will suggest the IVIG treatment instead of the plasmapheresis and that Dr. Mariel Sleet and Dr. Sandria Manly will be able to discuss her case tomorrow in detail.  The patient told me that her cardiologist is Dr. Andee Lineman, her oncologist is Dr. Mariel Sleet, and her primary care physician is Dr. Phillips Odor in [place].     Melvyn Novas, M.D.     CD/MEDQ  D:  04/10/2010  T:  04/11/2010  Job:  [id number]  cc:   Genene Churn. Love, M.D. Learta Codding, MD,FACC Corrie Mckusick, M.D.  Electronically Signed by Melvyn Novas M.D. on 05/18/2010 12:57:54 PM

## 2010-05-18 NOTE — Consult Note (Signed)
  NAMEBRITISH, MOYD NO.:  [id number]  MEDICAL RECORD NO.:  [id number]           PATIENT TYPE:  I  LOCATION:  3302                         FACILITY:  [facility]  PHYSICIAN:  Melvyn Novas, M.D.  DATE OF BIRTH:  [birth year]  DATE OF CONSULTATION: DATE OF DISCHARGE:                                CONSULTATION   ADDENDUM  DIFFERENTIAL DIAGNOSES:  Includes: 1. Possible Guillain-Barre syndrome since she is status post     infection. 2. This could be an ascending neuropathy introduced by chemotherapy     and should be considered in the differential since the Guillain-     Barre syndrome would be a demyelinating neuropathy and all other     neuropathies are likely external.  I will request one of my     colleagues that is neuromuscular specialized to perform an F-wave     nerve conduction study on this patient.  This will help with the     differential diagnosis since we cannot obtain easily a CSF sample     and since the patient does have an immunocompromised basic status     that requires [place] to be extremely careful with a central line     placements etc.  Mrs. Cedillos and her family voiced understanding.     My physician assistant will follow up with Mrs. Phang today once     all her lab results have returned.     Melvyn Novas, M.D.     CD/MEDQ  D:  04/11/2010  T:  04/12/2010  Job:  [id number]  cc:   Genene Churn. Love, M.D.  Electronically Signed by Melvyn Novas M.D. on 05/18/2010 12:57:49 PM

## 2010-05-22 ENCOUNTER — Ambulatory Visit: Payer: Self-pay | Admitting: *Deleted

## 2010-05-22 DIAGNOSIS — Z7901 Long term (current) use of anticoagulants: Secondary | ICD-10-CM

## 2010-05-22 DIAGNOSIS — I059 Rheumatic mitral valve disease, unspecified: Secondary | ICD-10-CM

## 2010-05-22 DIAGNOSIS — Z954 Presence of other heart-valve replacement: Secondary | ICD-10-CM

## 2010-05-23 ENCOUNTER — Encounter (HOSPITAL_COMMUNITY): Payer: PRIVATE HEALTH INSURANCE | Attending: Oncology | Admitting: Oncology

## 2010-05-23 DIAGNOSIS — D591 Autoimmune hemolytic anemia, unspecified: Secondary | ICD-10-CM

## 2010-05-23 DIAGNOSIS — C50919 Malignant neoplasm of unspecified site of unspecified female breast: Secondary | ICD-10-CM

## 2010-05-23 DIAGNOSIS — M329 Systemic lupus erythematosus, unspecified: Secondary | ICD-10-CM | POA: Insufficient documentation

## 2010-05-24 LAB — HAPTOGLOBIN: Haptoglobin: <6 mg/dL — ABNORMAL LOW (ref 16–200)

## 2010-05-24 LAB — DIFFERENTIAL
Basophils Absolute: 0 10*3/uL (ref 0.0–0.1)
Basophils Relative: 0 % (ref 0–1)
Eosinophils Absolute: 0.1 10*3/uL (ref 0.0–0.7)
Lymphs Abs: 0.6 10*3/uL — ABNORMAL LOW (ref 0.7–4.0)
Neutrophils Relative %: 85 % — ABNORMAL HIGH (ref 43–77)

## 2010-05-24 LAB — CBC
MCV: 92 fL (ref 78.0–100.0)
Platelets: 209 10*3/uL (ref 150–400)
RDW: 13.8 % (ref 11.5–15.5)
WBC: 7.6 10*3/uL (ref 4.0–10.5)

## 2010-05-24 LAB — RETICULOCYTES: Retic Ct Pct: 2.7 % (ref 0.4–3.1)

## 2010-05-25 ENCOUNTER — Ambulatory Visit (INDEPENDENT_AMBULATORY_CARE_PROVIDER_SITE_OTHER): Payer: PRIVATE HEALTH INSURANCE | Admitting: *Deleted

## 2010-05-25 ENCOUNTER — Encounter (HOSPITAL_COMMUNITY): Payer: PRIVATE HEALTH INSURANCE

## 2010-05-25 DIAGNOSIS — Z7901 Long term (current) use of anticoagulants: Secondary | ICD-10-CM

## 2010-05-25 DIAGNOSIS — Z954 Presence of other heart-valve replacement: Secondary | ICD-10-CM

## 2010-05-25 DIAGNOSIS — I059 Rheumatic mitral valve disease, unspecified: Secondary | ICD-10-CM

## 2010-05-25 LAB — POCT INR: INR: 2.4

## 2010-05-26 ENCOUNTER — Ambulatory Visit (HOSPITAL_COMMUNITY): Payer: Self-pay

## 2010-05-27 LAB — CBC
MCV: 91.9 fL (ref 78.0–100.0)
Platelets: 184 10*3/uL (ref 150–400)
WBC: 4.7 10*3/uL (ref 4.0–10.5)

## 2010-05-27 LAB — DIFFERENTIAL
Eosinophils Absolute: 0.1 10*3/uL (ref 0.0–0.7)
Lymphs Abs: 0.6 10*3/uL — ABNORMAL LOW (ref 0.7–4.0)
Neutro Abs: 3.7 10*3/uL (ref 1.7–7.7)
Neutrophils Relative %: 79 % — ABNORMAL HIGH (ref 43–77)

## 2010-05-27 LAB — RETICULOCYTES: Retic Ct Pct: 3 % (ref 0.4–3.1)

## 2010-05-27 LAB — HAPTOGLOBIN: Haptoglobin: <6 mg/dL — ABNORMAL LOW (ref 16–200)

## 2010-05-29 ENCOUNTER — Telehealth: Payer: Self-pay | Admitting: Cardiology

## 2010-05-29 ENCOUNTER — Ambulatory Visit (INDEPENDENT_AMBULATORY_CARE_PROVIDER_SITE_OTHER): Payer: PRIVATE HEALTH INSURANCE | Admitting: *Deleted

## 2010-05-29 DIAGNOSIS — Z954 Presence of other heart-valve replacement: Secondary | ICD-10-CM

## 2010-05-29 DIAGNOSIS — I059 Rheumatic mitral valve disease, unspecified: Secondary | ICD-10-CM

## 2010-05-29 DIAGNOSIS — Z7901 Long term (current) use of anticoagulants: Secondary | ICD-10-CM

## 2010-05-29 LAB — POCT INR: INR: 3.1

## 2010-05-29 NOTE — Telephone Encounter (Signed)
PT - 37.1 and INR - 3.1 / pls call Shanda Bumps with Instructions / tg

## 2010-05-30 LAB — CBC
HCT: 33.7 % — ABNORMAL LOW (ref 36.0–46.0)
MCV: 91.2 fL (ref 78.0–100.0)
Platelets: 191 10*3/uL (ref 150–400)
RDW: 13.9 % (ref 11.5–15.5)

## 2010-05-31 LAB — CBC
HCT: 36.3 % (ref 36.0–46.0)
Hemoglobin: 12.8 g/dL (ref 12.0–15.0)
MCHC: 35.2 g/dL (ref 30.0–36.0)
RDW: 13.8 % (ref 11.5–15.5)

## 2010-06-01 ENCOUNTER — Ambulatory Visit (INDEPENDENT_AMBULATORY_CARE_PROVIDER_SITE_OTHER): Payer: PRIVATE HEALTH INSURANCE | Admitting: *Deleted

## 2010-06-01 ENCOUNTER — Ambulatory Visit (HOSPITAL_COMMUNITY): Payer: PRIVATE HEALTH INSURANCE

## 2010-06-01 ENCOUNTER — Telehealth: Payer: Self-pay | Admitting: Cardiology

## 2010-06-01 ENCOUNTER — Ambulatory Visit (HOSPITAL_COMMUNITY)
Admission: RE | Admit: 2010-06-01 | Discharge: 2010-06-01 | Disposition: A | Discharge status: Home / Self Care | Payer: PRIVATE HEALTH INSURANCE | Source: Ambulatory Visit | Attending: Oncology | Admitting: Oncology

## 2010-06-01 DIAGNOSIS — C50919 Malignant neoplasm of unspecified site of unspecified female breast: Secondary | ICD-10-CM

## 2010-06-01 DIAGNOSIS — I059 Rheumatic mitral valve disease, unspecified: Secondary | ICD-10-CM

## 2010-06-01 DIAGNOSIS — Z954 Presence of other heart-valve replacement: Secondary | ICD-10-CM

## 2010-06-01 DIAGNOSIS — Z7901 Long term (current) use of anticoagulants: Secondary | ICD-10-CM

## 2010-06-01 LAB — CBC
HCT: 34.4 % — ABNORMAL LOW (ref 36.0–46.0)
MCHC: 35.4 g/dL (ref 30.0–36.0)
MCV: 89.6 fL (ref 78.0–100.0)
MCV: 89.4 fL (ref 78.0–100.0)
Platelets: 265 10*3/uL (ref 150–400)
Platelets: 219 10*3/uL (ref 150–400)
RBC: 3.67 MIL/uL — ABNORMAL LOW (ref 3.87–5.11)
RBC: 3.85 MIL/uL — ABNORMAL LOW (ref 3.87–5.11)
RDW: 15.1 % (ref 11.5–15.5)
WBC: 8.4 10*3/uL (ref 4.0–10.5)
WBC: 5.7 10*3/uL (ref 4.0–10.5)

## 2010-06-01 LAB — COMPREHENSIVE METABOLIC PANEL
AST: 33 U/L (ref 0–37)
Albumin: 3.5 g/dL (ref 3.5–5.2)
Alkaline Phosphatase: 94 U/L (ref 39–117)
BUN: 8 mg/dL (ref 6–23)
Chloride: 100 mEq/L (ref 96–112)
GFR calc Af Amer: >60 mL/min (ref >60)
Potassium: 3.5 mEq/L (ref 3.5–5.1)
Total Protein: 8.1 g/dL (ref 6.0–8.3)

## 2010-06-01 LAB — DIFFERENTIAL
Basophils Absolute: 0 10*3/uL (ref 0.0–0.1)
Basophils Relative: 1 % (ref 0–1)
Eosinophils Absolute: 0.6 10*3/uL (ref 0.0–0.7)
Eosinophils Relative: 7 % — ABNORMAL HIGH (ref 0–5)
Lymphocytes Relative: 35 % (ref 12–46)
Lymphs Abs: 2.9 10*3/uL (ref 0.7–4.0)
Neutro Abs: 4.3 10*3/uL (ref 1.7–7.7)

## 2010-06-01 LAB — RETICULOCYTES: Retic Count, Absolute: 96.3 10*3/uL (ref 19.0–186.0)

## 2010-06-01 NOTE — Telephone Encounter (Signed)
See coumadin note. 

## 2010-06-01 NOTE — Telephone Encounter (Signed)
PT - 17.3 INR - 1.4 / patient taking 15mg  QD / also wants you to know that she is restarting chemo treatments and 1st one is on Monday / pls call Kim with instructions / tg

## 2010-06-05 ENCOUNTER — Inpatient Hospital Stay (HOSPITAL_COMMUNITY): Payer: PRIVATE HEALTH INSURANCE

## 2010-06-05 ENCOUNTER — Encounter (HOSPITAL_COMMUNITY): Payer: PRIVATE HEALTH INSURANCE

## 2010-06-05 DIAGNOSIS — C50919 Malignant neoplasm of unspecified site of unspecified female breast: Secondary | ICD-10-CM

## 2010-06-05 DIAGNOSIS — Z5111 Encounter for antineoplastic chemotherapy: Secondary | ICD-10-CM

## 2010-06-05 LAB — CBC
HCT: 34.5 % — ABNORMAL LOW (ref 36.0–46.0)
Hemoglobin: 11.6 g/dL — ABNORMAL LOW (ref 12.0–15.0)
MCV: 89.8 fL (ref 78.0–100.0)
WBC: 5.2 10*3/uL (ref 4.0–10.5)

## 2010-06-05 LAB — RETICULOCYTES
RBC.: 3.85 MIL/uL — ABNORMAL LOW (ref 3.87–5.11)
Retic Count, Absolute: 84.7 10*3/uL (ref 19.0–186.0)

## 2010-06-06 ENCOUNTER — Encounter (HOSPITAL_COMMUNITY): Payer: PRIVATE HEALTH INSURANCE

## 2010-06-06 ENCOUNTER — Ambulatory Visit: Payer: Self-pay | Admitting: *Deleted

## 2010-06-06 DIAGNOSIS — I059 Rheumatic mitral valve disease, unspecified: Secondary | ICD-10-CM

## 2010-06-06 DIAGNOSIS — Z954 Presence of other heart-valve replacement: Secondary | ICD-10-CM

## 2010-06-06 DIAGNOSIS — M79609 Pain in unspecified limb: Secondary | ICD-10-CM

## 2010-06-06 DIAGNOSIS — R112 Nausea with vomiting, unspecified: Secondary | ICD-10-CM

## 2010-06-06 DIAGNOSIS — Z7901 Long term (current) use of anticoagulants: Secondary | ICD-10-CM

## 2010-06-06 DIAGNOSIS — G5790 Unspecified mononeuropathy of unspecified lower limb: Secondary | ICD-10-CM

## 2010-06-06 DIAGNOSIS — C50919 Malignant neoplasm of unspecified site of unspecified female breast: Secondary | ICD-10-CM

## 2010-06-06 LAB — CBC
HCT: 33.6 % — ABNORMAL LOW (ref 36.0–46.0)
Hemoglobin: 11.7 g/dL — ABNORMAL LOW (ref 12.0–15.0)
RBC: 3.77 MIL/uL — ABNORMAL LOW (ref 3.87–5.11)

## 2010-06-06 LAB — RETICULOCYTES
RBC.: 3.77 MIL/uL — ABNORMAL LOW (ref 3.87–5.11)
Retic Count, Absolute: 82.9 10*3/uL (ref 19.0–186.0)
Retic Ct Pct: 2.2 % (ref 0.4–3.1)

## 2010-06-06 LAB — POCT INR: INR: 3.8

## 2010-06-07 ENCOUNTER — Encounter (HOSPITAL_COMMUNITY): Payer: PRIVATE HEALTH INSURANCE

## 2010-06-07 DIAGNOSIS — C50919 Malignant neoplasm of unspecified site of unspecified female breast: Secondary | ICD-10-CM

## 2010-06-07 DIAGNOSIS — Z5189 Encounter for other specified aftercare: Secondary | ICD-10-CM

## 2010-06-07 NOTE — Discharge Summary (Signed)
[name], [name]               ACCOUNT NO.:  [id number]  MEDICAL RECORD NO.:  [id number]           PATIENT TYPE:  I  LOCATION:  A331                          FACILITY:  [facility]  PHYSICIAN:  Kirah Stice L. Lendell [name], MDDATE OF BIRTH:  1970/02/29  DATE OF ADMISSION:  05/05/2010 DATE OF DISCHARGE:  03/19/2012LH                              DISCHARGE SUMMARY   DISCHARGE DIAGNOSES: 1. Resolved vomiting and diarrhea, viral gastroenteritis versus mild     pancreatitis. 2. Chronic nausea. 3. Reported fevers and chills, none during the hospitalization. 4. History of breast cancer status post mastectomy on April 20, 2010,     and chemotherapy. 5. Lupus. 6. Rheumatic heart disease status post mitral valve replacement with a     St. Jude valve in 2008. 7. Peripheral neuropathy, reportedly chemotherapy related. 8. Previous history of clostridium difficile colitis. 9. History of hemolytic anemia. 10.Hypothyroidism. 11.Gastroesophageal reflux disease.  DISCHARGE MEDICATIONS: 1. Phenergan 25 mg one-half tablet to 1 tablet p.o. q.6 h. p.r.n.     nausea. 2. Oxycodone IR 5 mg 1-2 p.o. q.4 h. p.r.n. severe pain. 3. Tylenol 650 mg p.o. q.4 h. p.r.n. pain or fever. 4. Plaquenil 200 mg twice a day. 5. Synthroid 25 mcg one-half tablet daily. 6. KCl 20 mEq a day. 7. Cyclobenzaprine 10 mg p.o. b.i.d. p.r.n. muscle spasm. 8. Cyanocobalamin 1000 mcg injection IM q.4 months. 9. Folic acid 1 mg a day. 10.Lasix 40 mg one-half tablet daily for swelling. 11.Famotidine 20 mg p.o. b.i.d. 12.Stop Vicodin. 13.Gabapentin 400 mg p.o. q.a.m., 600 mg p.o. at bedtime. 14.Warfarin 5 mg 2 tablets alternating with 2-1/2 tablets nightly.  CONDITION:  Stable.  ACTIVITY:  Ad lib.  Follow up with Dr. Phillips Odor, also has an appointment scheduled with Dr. Nickola Major and Dr. Ovidio Kin, previously scheduled.  Diet should be low fat until symptoms resolve, then warfarin friendly regular.  LABORATORY DATA:  CBC significant  for normal white count, hemoglobin of 10.2, hematocrit 30.2.  INR on admission was 2.03, PTT 64.  At discharge INR is 2.5.  Complete metabolic panel on admission significant for a potassium of 3.0, alkaline phosphatase of 150, SGOT of 49, albumin 3.4, total protein 8.7.  At discharge, her potassium is 4.4.  Amylase on admission 139 which was slightly elevated, lipase on admission was 98, which is slightly elevated.  Urinalysis showed specific gravity of greater than 1.030, small bilirubin, 15 ketones, negative nitrite, small leukocyte esterase, few squamous epithelial cells, 7-10 white cells, 0-2 red cells, many bacteria.  C. diff PCR of stool was negative.  Urine culture negative.  Blood cultures are negative to date.  Stool culture to date shows reduced normal flora but otherwise no growth.  DIAGNOSTICS:  Acute abdominal series showed nothing acute.  PICC line has been removed.  HISTORY AND HOSPITAL COURSE:  Please see H and P for complete admission details.  [name] is a pleasant 40 year old white female with multiple medical problems, particularly recently after treatment for her breast cancer.  She had several days of vomiting, diarrhea, reported fever up to 102 or 103.  Prior to admission, she had an evaluation by  the general surgeon who felt that her mastectomy site looked without signs of infection.  He also aspirated some fluid that reportedly looked fine, but she was started prior to admission on Cipro empirically.  She took a few doses and began having profuse vomiting and watery stools. She also had some epigastric pain.  On admission, she was afebrile.  She had a soft abdomen and her mastectomy site showed no signs of infection. She was initially started empirically on Flagyl.  Her C. diff PCR was negative and this was stopped.  Amylase and lipase are slightly elevated which could explain the vomiting and pain but not necessarily the diarrhea.  She may have had a viral  gastroenteritis as well.  She does not drink alcohol.  Nevertheless, her diet was able to be advanced quickly.  She is tolerating clear liquids and full liquids with a few bites here and there of solid food.  Unfortunately, she was started on a regular diet and tried eating a hamburger for lunch.  Needless to say, she reported some pain after a few bites.  She is feeling much better. Her abdomen is soft, nontender.  She is tolerating food and I have encouraged her to avoid fatty foods.  She is requesting discharge which is reasonable. She appears better than I have ever seen her.  She can follow up with her primary care physician should her pain and vomiting continue.  Total time on the day of discharge was greater than 30 minutes.     Waldine Zenz L. Lendell [name], MD     CLS/MEDQ  D:  05/08/2010  T:  05/08/2010  Job:  [id number]  cc:   Ladona Horns. Mariel Sleet, MD Fax: [phone]  Corrie Mckusick, M.D. Fax: [phone]  Sandria Bales. Ezzard Standing, M.D. [address] [place] [address]  Zenovia Jordan, MD Fax: [phone]  Electronically Signed by Crista Curb MD on 06/07/2010 09:27:36 PM

## 2010-06-08 ENCOUNTER — Ambulatory Visit: Payer: Self-pay | Admitting: *Deleted

## 2010-06-08 ENCOUNTER — Telehealth: Payer: Self-pay | Admitting: Cardiology

## 2010-06-08 DIAGNOSIS — Z954 Presence of other heart-valve replacement: Secondary | ICD-10-CM

## 2010-06-08 DIAGNOSIS — I059 Rheumatic mitral valve disease, unspecified: Secondary | ICD-10-CM

## 2010-06-08 DIAGNOSIS — Z7901 Long term (current) use of anticoagulants: Secondary | ICD-10-CM

## 2010-06-08 LAB — POCT INR: INR: 4.3

## 2010-06-08 NOTE — Telephone Encounter (Signed)
Done.  See coumadin note. 

## 2010-06-08 NOTE — Telephone Encounter (Signed)
PT - 51.9 and INR - 4.3 / pls call Kim w/ instructions / tg

## 2010-06-12 ENCOUNTER — Other Ambulatory Visit (HOSPITAL_COMMUNITY): Payer: PRIVATE HEALTH INSURANCE

## 2010-06-12 ENCOUNTER — Encounter (HOSPITAL_COMMUNITY): Payer: PRIVATE HEALTH INSURANCE

## 2010-06-12 DIAGNOSIS — E860 Dehydration: Secondary | ICD-10-CM

## 2010-06-12 DIAGNOSIS — C50919 Malignant neoplasm of unspecified site of unspecified female breast: Secondary | ICD-10-CM

## 2010-06-15 ENCOUNTER — Ambulatory Visit: Payer: Self-pay | Admitting: *Deleted

## 2010-06-15 DIAGNOSIS — I059 Rheumatic mitral valve disease, unspecified: Secondary | ICD-10-CM

## 2010-06-15 DIAGNOSIS — Z954 Presence of other heart-valve replacement: Secondary | ICD-10-CM

## 2010-06-15 DIAGNOSIS — Z7901 Long term (current) use of anticoagulants: Secondary | ICD-10-CM

## 2010-06-15 LAB — POCT INR: INR: 3.5

## 2010-06-19 ENCOUNTER — Other Ambulatory Visit (HOSPITAL_COMMUNITY): Payer: PRIVATE HEALTH INSURANCE

## 2010-06-23 ENCOUNTER — Ambulatory Visit: Payer: Self-pay | Admitting: *Deleted

## 2010-06-23 DIAGNOSIS — Z7901 Long term (current) use of anticoagulants: Secondary | ICD-10-CM

## 2010-06-23 DIAGNOSIS — I059 Rheumatic mitral valve disease, unspecified: Secondary | ICD-10-CM

## 2010-06-23 DIAGNOSIS — Z954 Presence of other heart-valve replacement: Secondary | ICD-10-CM

## 2010-06-23 LAB — POCT INR: INR: 3.6

## 2010-06-26 ENCOUNTER — Encounter (HOSPITAL_COMMUNITY): Payer: PRIVATE HEALTH INSURANCE | Attending: Oncology

## 2010-06-26 DIAGNOSIS — M79609 Pain in unspecified limb: Secondary | ICD-10-CM

## 2010-06-26 DIAGNOSIS — Z5111 Encounter for antineoplastic chemotherapy: Secondary | ICD-10-CM

## 2010-06-26 DIAGNOSIS — C50919 Malignant neoplasm of unspecified site of unspecified female breast: Secondary | ICD-10-CM

## 2010-06-27 ENCOUNTER — Encounter (HOSPITAL_COMMUNITY): Payer: PRIVATE HEALTH INSURANCE

## 2010-06-27 ENCOUNTER — Encounter (HOSPITAL_COMMUNITY): Payer: PRIVATE HEALTH INSURANCE | Attending: Oncology

## 2010-06-27 ENCOUNTER — Encounter (HOSPITAL_COMMUNITY): Payer: PRIVATE HEALTH INSURANCE | Admitting: Oncology

## 2010-06-27 ENCOUNTER — Ambulatory Visit (HOSPITAL_COMMUNITY): Payer: PRIVATE HEALTH INSURANCE

## 2010-06-27 DIAGNOSIS — Z5189 Encounter for other specified aftercare: Secondary | ICD-10-CM

## 2010-06-27 DIAGNOSIS — C50919 Malignant neoplasm of unspecified site of unspecified female breast: Secondary | ICD-10-CM

## 2010-06-28 ENCOUNTER — Ambulatory Visit (INDEPENDENT_AMBULATORY_CARE_PROVIDER_SITE_OTHER): Payer: Self-pay | Admitting: *Deleted

## 2010-06-28 ENCOUNTER — Encounter: Payer: Self-pay | Admitting: *Deleted

## 2010-06-28 ENCOUNTER — Encounter: Payer: PRIVATE HEALTH INSURANCE | Admitting: *Deleted

## 2010-06-28 ENCOUNTER — Other Ambulatory Visit (HOSPITAL_COMMUNITY): Payer: Self-pay | Admitting: Oncology

## 2010-06-28 ENCOUNTER — Encounter (HOSPITAL_COMMUNITY): Payer: PRIVATE HEALTH INSURANCE

## 2010-06-28 DIAGNOSIS — R52 Pain, unspecified: Secondary | ICD-10-CM

## 2010-06-28 DIAGNOSIS — R0989 Other specified symptoms and signs involving the circulatory and respiratory systems: Secondary | ICD-10-CM

## 2010-06-28 DIAGNOSIS — R112 Nausea with vomiting, unspecified: Secondary | ICD-10-CM

## 2010-06-28 DIAGNOSIS — M329 Systemic lupus erythematosus, unspecified: Secondary | ICD-10-CM | POA: Insufficient documentation

## 2010-06-28 DIAGNOSIS — C50919 Malignant neoplasm of unspecified site of unspecified female breast: Secondary | ICD-10-CM

## 2010-06-28 DIAGNOSIS — D591 Autoimmune hemolytic anemia, unspecified: Secondary | ICD-10-CM | POA: Insufficient documentation

## 2010-06-30 ENCOUNTER — Encounter (HOSPITAL_COMMUNITY): Payer: PRIVATE HEALTH INSURANCE

## 2010-06-30 ENCOUNTER — Other Ambulatory Visit (HOSPITAL_COMMUNITY): Payer: Self-pay | Admitting: Oncology

## 2010-06-30 DIAGNOSIS — C50919 Malignant neoplasm of unspecified site of unspecified female breast: Secondary | ICD-10-CM

## 2010-06-30 DIAGNOSIS — M79609 Pain in unspecified limb: Secondary | ICD-10-CM

## 2010-06-30 DIAGNOSIS — E538 Deficiency of other specified B group vitamins: Secondary | ICD-10-CM

## 2010-06-30 LAB — BASIC METABOLIC PANEL
BUN: 14 mg/dL (ref 6–23)
CO2: 22 mEq/L (ref 19–32)
Chloride: 104 mEq/L (ref 96–112)
Creatinine, Ser: 0.53 mg/dL (ref 0.4–1.2)
Glucose, Bld: 92 mg/dL (ref 70–99)

## 2010-07-03 ENCOUNTER — Encounter (HOSPITAL_COMMUNITY): Payer: PRIVATE HEALTH INSURANCE | Attending: Oncology

## 2010-07-03 ENCOUNTER — Other Ambulatory Visit (HOSPITAL_COMMUNITY): Payer: Self-pay | Admitting: Oncology

## 2010-07-03 DIAGNOSIS — C50919 Malignant neoplasm of unspecified site of unspecified female breast: Secondary | ICD-10-CM

## 2010-07-03 LAB — DIFFERENTIAL
Basophils Relative: 3 % — ABNORMAL HIGH (ref 0–1)
Eosinophils Absolute: 0 10*3/uL (ref 0.0–0.7)
Eosinophils Relative: 2 % (ref 0–5)
Monocytes Absolute: 0.3 10*3/uL (ref 0.1–1.0)
Monocytes Relative: 13 % — ABNORMAL HIGH (ref 3–12)
Neutro Abs: 0.7 10*3/uL — ABNORMAL LOW (ref 1.7–7.7)

## 2010-07-03 LAB — CBC
HCT: 28.5 % — ABNORMAL LOW (ref 36.0–46.0)
Hemoglobin: 9.7 g/dL — ABNORMAL LOW (ref 12.0–15.0)
MCH: 29.7 pg (ref 26.0–34.0)
RBC: 3.27 MIL/uL — ABNORMAL LOW (ref 3.87–5.11)

## 2010-07-04 NOTE — Assessment & Plan Note (Signed)
[facility]                          [facility] OFFICE NOTE   NAME:[name], [name]                      MRN:          [id number]  DATE:06/19/2007                            DOB:          1970/04/26    REFERRING PHYSICIAN:  Corrie Mckusick, M.D.   HISTORY OF PRESENT ILLNESS:  The patient is a 40 year old female with a  history of mitral valve disease secondary to a history of rheumatic  heart disease with mitral valve replacement.  The patient underwent  recent endometrial surgery for control of hemostasis.  We managed her  bridging therapy with Lovenox.  The patient has done well with this and  had no complication with thromboembolic events.  She also had no  significant bleeding. She had an INR checked recently which was 2.9.  She is due for another PT/INR today in the office.  She denies any chest  pain, shortness breath, orthopnea, PND.  She has received notice from  Dr. Phillips Odor that she may need a rheumatology workup of the possibility  of lupus.  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 However, I am not in the possession of this blood work  but I have requested this for review.   MEDICATIONS:  1. Lopressor 25 mg 1/2 tablet p.o. b.i.d.  2. Lasix 40 mg p.o. daily.  3. Warfarin as directed.  4. Lotensin 5 mg p.o. daily.  5. Wellbutrin 150 mg p.o. daily.  6. Levothyroxine 25 mcg.   PHYSICAL EXAMINATION:  VITAL SIGNS:  Blood pressure 114/78, heart rate  77, weight  176pounds.  NECK:  Normal carotid upstroke, no carotid bruits.  LUNGS:  Clear breath sounds bilaterally.  HEART:  Regular rate and rhythm with normal close and click of S1.  ABDOMEN:  Soft, nontender, no rebound or guarding. Good bowel sounds.  EXTREMITIES:  No cyanosis, clubbing or edema.   PROBLEM LIST:  1. Rheumatic valvular heart disease.      a.     Status post mitral  valve replaced May 09, 2006 with St.       Jude prosthesis via key hole procedures.      b.     Normal LV function by echo July 2008.  2. History of pulmonary hypertension. Essentially resolved      postoperatively.  3. History of Raynaud's syndrome..  4. Rule out lupus.  5. Pericardiotomy syndrome status post valve replacement.  6. Vaginal bleeding status post endometrial surgery.  7. Coumadin anticoagulation.  8. Anemia, stable.   PLAN:  1. The patient will have 2-D echo ordered for reassessment of her LV      function and valve function.  2. I have requested blood work from Dr. Phillips Odor for review.  3. PT/INR will be drawn in the office today.     Learta Codding, MD,FACC  Electronically Signed    GED/MedQ  DD: 06/19/2007  DT: 06/19/2007  Job #: [id number]   cc:   Corrie Mckusick, M.D.  Amaryllis Dyke.  Despina Hidden, M.D.

## 2010-07-04 NOTE — Assessment & Plan Note (Signed)
[facility]                          [facility] NOTE   NAME:[name], [name] KIGER                      MRN:          [id number]  DATE:05/13/2007                            DOB:          [birth year]    REFERRING PHYSICIAN:  Lazaro Arms, M.D.   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  Procedure was done at [facility].  Her  postoperative course was complicated post-pericardiotomy syndrome and  postoperative anemia, thrombocytopenia.  The patient, over the last  several months since November 2008, has had problems with vaginal  bleeding.  She has been iron deficient and has had low hemoglobins.  She  has been maintained on Coumadin throughout.  The plan is now to proceed  with endometrial ablation for control of hemostasis.  The patient is  seen by Dr. Despina Hidden, and the plan is to do an endometrial ablation in the  next couple of days.  In the interim the patient has been maintained on  Megace and iron.   The patient reports no chest pain, shortness of breath, orthopnea, or  PND.  She had does have significant weight gain likely related to her  Megace.  She has gained approximately 13 pounds since her last office  visit here.   MEDICATIONS:  Lopressor 25 mg half tablet p.o. b.i.d., Lasix 40 mg p.o.  daily, aspirin 81 mg p.o. daily.  Warfarin as directed.  Multivitamin  daily.  Wellbutrin 150 mg p.o. daily.  Megace.  Levothyroxine.  Iron.   PHYSICAL EXAMINATION:  VITAL SIGNS:  Blood pressure 103/71, heart rate  is a 73 beats per minute, weight 173 pounds.  Neck exam normal carotid stroke and no carotid bruits.  LUNGS:  Clear breath sounds bilaterally.  HEART:  Regular rate and rhythm.  Normal closing click of S1 and normal  S2.  No pathological murmurs.  ABDOMEN:  Soft, nontender rebound or guarding.  Good bowel  sounds.  Extremity exam no cyanosis, clubbing or edema.  NEURO:  Patient alert and oriented, grossly nonfocal.   PROBLEM LIST:  1. Rheumatic valvular heart disease.      a.     Status post mitral valve replacement on May 09, 2006 via       key-hole procedure.      b.     Normal LV systolic function well seated prosthesis by echo       July 2008.  2. History of pulmonary hypertension, essentially resolved      postoperatively.  3. History of Raynaud's phenomena.  4. Pericardiotomy syndrome post valve replacement.  5. Vaginal bleeding.  6. Coumadin anticoagulation.  7. Anemia.   PLAN:  1. The patient has a St. Jude valve in the mitral position with no      antecedent history of stroke or atrial fibrillation.  This places      the patient at intermediate risk for thromboembolism with a      decision of bridging on a case by  case basis.  2. I spoke with Dr. Despina Hidden about practicality of our approach, and the      plan is for the patient to stop Coumadin on March 28.  At that time      the patient will also be placed on Lovenox therapy 80 mg      subcutaneous q.12.  This will be continued until the day before      surgery omitting the evening dose on the 31st.  The next day, the      patient will also will omit her Lovenox dose and the patient can be      restarted on Lovenox later that day after he endometrial procedure.      The next morning she can continue on Lovenox which is April 2 as      well as restarting her Coumadin.  3. In the interim, I have also asked the patient to stop aspirin a      week before her surgery.  In reviewing the patient's records, I am      not sure that she is necessarily in need of aspirin at this point      in time.  Certainly, because of her risk of bleeding, we will      discontinue this a week before her surgery.  4. I have given the patient my phone number as well as contacted Dr.      Despina Hidden, and I will stay in touch perioperatively, if there are  any      questions regarding her anticoagulation regimen.     Learta Codding, MD,FACC  Electronically Signed    GED/MedQ  DD: 05/13/2007  DT: 05/13/2007  Job #: [id number]   cc:   Lazaro Arms, M.D.

## 2010-07-04 NOTE — Assessment & Plan Note (Signed)
[facility]                          [facility] NOTE   NAME:[name], [name] HICKEY                      MRN:          [id number]  DATE:06/07/2008                            DOB:          19-Mar-1970    HISTORY OF PRESENT ILLNESS:  The patient is a 40 year old female with a  history of mechanical valve replacements with a St. Jude prosthesis via  keyhole procedures.  The patient's last echo was done last year which  showed normal valve function and normal LV function.  There was also no  further evidence of pulmonary hypertension.  She is under treatment of  Dr. Mariel Sleet for SLE.  The patient reportedly also had a hemolytic  anemia at least that is what the information I got from her because I do  not have any records on this and was initially placed on prednisone  without success.  She then was given what I assume as intravenous IgG,  but I am not certain in any event.  I saw lab work from April where her  platelets and hemoglobin were entirely within normal limits.  From a  cardiac standpoint, she is doing quite well.  She denies any shortness  of breath, orthopnea or PND, palpitations or syncope.  The patient has  now joined the [facility].  She is trying to lose some weight now that she came  off the prednisone.   MEDICATIONS:  1. Lopressor 25 mg half a tablet p.o. b.i.d.  2. Lasix 40 mg p.o. daily.  3. Warfarin as directed.  4. Lotensin 5 mg p.o. daily.  5. Klor-Con 10 mg p.o. daily.  6. Hydroxychloride 200 mg p.o. b.i.d.  7. Plaquenil.   PHYSICAL EXAMINATION:  VITAL SIGNS:  Blood pressure is 103/72, heart  rate is 70, weight is 187 pounds.  GENERAL:  Well-nourished white female in no apparent distress.  Somewhat  with a moon-faced appearance.  HEENT:  Pupils, eyes are equal.  Conjunctivae clear.  NECK:  Supple.  Normal carotid stroke.  No carotid bruits.  LUNGS:  Clear breath sounds bilaterally.  HEART:  Regular rate and rhythm with no  closing click of S1 and no  pathological murmurs.  ABDOMEN:  Soft, nontender.  No rebound or guarding.  Good bowel sounds.  EXTREMITIES:  No cyanosis, clubbing, or edema.   PROBLEM LIST:  1. Status post mitral valve replacement with a St. Jude prosthesis via      keyhole procedures.  2. Normal valve function by echocardiogram in May 2009.  3. Normal left ventricular function.  4. Pulmonary potential, resolved.  5. Raynaud phenomenon and lupus erythematosus.  6. Pericardiotomy syndrome status post valve replacement.  7. Vaginal bleeding status post endometrial surgery.  8. Coumadin anticoagulation.  9. 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   PLAN:  1. The patient is doing well from a cardiac perspective.  I encouraged      her to be active and increase her activity level.  I told her that      there no particular restrictions but that she should not lift very  heavy weights in the [facility] but rather use low weights with frequent      repetitions.  2. I have made no changes in the patient's medical regimen.  I am very      comfortable with her physical exam her symptoms that she does not      require a repeat echo at this point in time.  We will have this      done on the next visit.     Learta Codding, MD,FACC  Electronically Signed    GED/MedQ  DD: 06/07/2008  DT: 06/08/2008  Job #: [id number]   cc:   Ladona Horns. Mariel Sleet, MD  Corrie Mckusick, M.D.

## 2010-07-04 NOTE — Op Note (Signed)
[name], [name]               ACCOUNT NO.:  [id number]   MEDICAL RECORD NO.:  [id number]          PATIENT TYPE:  AMB   LOCATION:  DAY                           FACILITY:  [facility]   PHYSICIAN:  Lazaro Arms, M.D.   DATE OF BIRTH:  [birth year]   DATE OF PROCEDURE:  05/21/2007  DATE OF DISCHARGE:                               OPERATIVE REPORT   PREOPERATIVE DIAGNOSES:  1. Menometrorrhagia.  2. Dysmenorrhea.  3. Anemia.  4. History of Saint Jude mitral valve replacement.   POSTOPERATIVE DIAGNOSES:  1. Menometrorrhagia.  2. Dysmenorrhea.  3. Anemia.  4. History of Saint Jude mitral valve replacement.   PROCEDURE:  Hysteroscopy, dilatation and curettage, endometrial  ablation.   SURGEON:  Lazaro Arms, M.D.   ANESTHESIA:  General endotracheal.   FINDINGS:  Patient had normal endometrium, no polyps, no fibroids, no  abnormalities.   DESCRIPTION OF OPERATION:  Patient was taken to the operating room,  placed in supine position, where she underwent general endotracheal  anesthesia.  She was then placed in dorsal lithotomy position, prepped  and draped in the usual sterile fashion.  A Graves speculum was placed.  The cervix was grasped, dilated serially to allow passage of the  hysteroscope.  Hysteroscopy was performed and the endometrium was  visualized to be normal, no polyps, no fibroids, no abnormalities.  A  vigorous uterine curettage was then performed.  Good uterine cry was  obtained in all areas.   ThermaChoice III endometrial ablation balloon was used; 16 mL of D5W was  required to maintain a pressure between 195 and 204 mmHg throughout the  procedure.  It was heated to 88 degrees, Celsius.  Total therapy time  was 9 minutes 20 seconds.  All the fluid was returned in the procedure.   The patient tolerated the procedure well.  She experienced minimal blood  loss.  She was taken to the recovery room in good, stable condition, all  counts correct.   I will give her  Lovenox in the recovery room.  Since things went well,  she will restart her Coumadin tonight and she is discharged to home to  take amoxicillin for the next week for prophylaxis.      Lazaro Arms, M.D.  Electronically Signed     LHE/MEDQ  D:  05/21/2007  T:  05/21/2007  Job:  [id number]

## 2010-07-04 NOTE — Assessment & Plan Note (Signed)
[facility]                          [facility] NOTE   NAME:Mcgivern, JAKYRA KENEALY                      MRN:          [id number]  DATE:10/15/2007                            DOB:          12-29-1970    HISTORY OF PRESENT ILLNESS:  The patient is a 40 year old female with  history of mitral valve disorder, which in retrospect may well be due to  lupus valvular heart disease.  The patient's mitral prosthesis working  well and she has no associated complications.  She is on Coumadin and  this was monitored.  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  From a cardiac perspective, she reports occasional  palpitations, as a matter of fact, she had on 2 occasions, which lasted  less than 1 minute.  However, she was not certain whether this was  secondary to an episode of anxiety or whether the cardiac arrhythmia was  a primary problem.  She denied any associated symptoms of shortness of  breath or near syncope.   MEDICATIONS:  1. Lopressor 25 mg, half a tablet p.o. b.i.d.  2. Lasix 40 mg p.o. daily.  3. Warfarin as directed.  4. Lotensin 5 mg p.o. daily.  5. Wellbutrin 150 p.o. daily.  6. Megace.  7. Levothyroxine.  8. Prednisone.  9. Omeprazole.  10.Klor-Con.  11.Hydroxychloride 200 mg p.o. b.i.d.   PHYSICAL EXAMINATION:  VITAL SIGNS:  Blood pressure is 107/71, heart  rate 68, weight 179 pounds.  NECK:  Normal carotid upstroke.  No carotid bruits.  LUNGS:  Clear breath sounds bilaterally.  HEART:  Regular rate and rhythm with normal closing and click of S1.  No  pathological murmurs.  ABDOMEN:  Soft, nontender.  EXTREMITIES:  No clubbing or edema.   PROBLEM LIST:  1. Valvular heart disease status post mitral valve replacement with      St. Jude prosthesis via key-hole procedure.  2. Normal left ventricular function by echo of May 2009.  3. History of  pulmonary hypertension resolved.  4. Raynaud phenomenon and lupus.  5. Pericardiotomy syndrome status post valve replacement.  6. Vaginal bleeding status post endometrial surgery.  7. Coumadin anticoagulation.   PLAN:  1. From cardiac perspective, the patient is doing well.  We will check      her Coumadin today.  2. I made no change in her cardiac medications .  I did recommend her      that if she has recurrent palpitations she should wear a monitor.      She can also take an extra dose of Lopressor.  3. Episodes appears to be very infrequent.  I have made no other      intervention.  It also is possible that this could be related to      associated anxiety, but we will continue to further monitor this.     Learta Codding, MD,FACC  Electronically Signed    GED/MedQ  DD: 10/15/2007  DT: 10/16/2007  Job #: [id number]   cc:  Corrie Mckusick, M.D.

## 2010-07-04 NOTE — Assessment & Plan Note (Signed)
[facility]                          [facility] NOTE   NAME:[name]                      MRN:          [id number]  DATE:09/11/2006                            DOB:          Apr 15, 1970    REFERRING PHYSICIAN:  Dr. Phillips Odor   HISTORY OF PRESENT ILLNESS:  Patient is a 40 year old female with a  history of severe mitral valve disease, status post mitral valve  replacement.  The patient has done well postoperatively.  INR is  followed by Dr. Phillips Odor.  She has no significant cardiovascular  symptoms.  The patient presents for a follow-up appointment and  scheduling of a follow-up echocardiographic study.   MEDICATIONS:  1. Lopressor 25 mg 1/2 tablet b.i.d.  2. Lasix 40 mg p.o. daily.  3. Aspirin 81 mg a day.  4. Warfarin as directed.  5. Lotensin 5 mg p.o. daily.  6. Klor-Con.   PHYSICAL EXAMINATION:  VITAL SIGNS:  Blood pressure 110/82, heart rate  73, weight is 160 pounds.  NECK:  Normal carotid upstrokes.  No carotid bruits.  LUNGS:  Clear.  HEART:  Regular rate and rhythm with normal closing click of S1 and  normal S2.  No pathological murmurs.  ABDOMEN:  Soft, nontender.  No rebound or guarding.  Good bowel sounds.  EXTREMITIES:  No clubbing, cyanosis or edema.  NEURO:  Patient is alert, oriented, grossly nonfocal.   PROBLEM LIST:  1. Rheumatic valvular heart disease, status post mitral valve replaced      on May 09, 2006 via keyhole procedure.  2. Pulmonary hypertension.  3. Tricuspid regurgitation.  4. Raynaud's phenomena.  5. Status post pericardiotomy syndrome, post mitral valve replacement.  6. Postoperative anemia and thrombocytopenia.  7. Coumadin anticoagulation.   PLAN:  1. Patient will be referred for an echocardiographic study to re-      evaluate her ejection fraction as well as re-evaluate her pulmonary      artery pressures.  2. Patient will have a CBC drawn to make sure she is not anemic and      her  platelets have      normalized.  3. Patient can follow up with [place] in one year.     Learta Codding, MD,FACC  Electronically Signed    GED/MedQ  DD: 09/11/2006  DT: 09/12/2006  Job #: [id number]   cc:   Dr. Phillips Odor

## 2010-07-07 NOTE — Cardiovascular Report (Signed)
[name], [name] NO.:  [id number]   MEDICAL RECORD NO.:  [id number]          PATIENT TYPE:  OIB   LOCATION:  1962                         FACILITY:  [facility]   PHYSICIAN:  Veverly Fells. Excell Seltzer, MD  DATE OF BIRTH:  11-04-1970   DATE OF PROCEDURE:  03/26/2006  DATE OF DISCHARGE:                            CARDIAC CATHETERIZATION   PROCEDURE:  Left heart catheterization, selective coronary angiography,  left ventricular angiography, right heart catheterization with  vasodilator challenge.   INDICATIONS:  [name] is a very nice 40 year old woman with mitral  valve disease.  She has mixed mitral regurgitation and stenosis with  predominant mitral regurgitation.  This was thought to be secondary to  rheumatic heart disease.  She presented recently with shortness of  breath and congestive heart failure.  By echo, her tricuspid  regurgitation velocities are consistent with severe pulmonary  hypertension.  She was subsequently referred for right and left heart  catheterization to define her hemodynamics and evaluate her coronary  anatomy for potential as well as her surgical candidacy.   PROCEDURAL DETAILS:  Risks and indications of procedure were explained  to the patient.  Informed consent was obtained.  The right groin was  prepped, draped and anesthetized with 1% lidocaine.  Using modified  Seldinger technique a 4-French sheath was placed in the right femoral  artery, and a 7-French sheath was placed in the right femoral vein.  Swan-Ganz catheter was used perform right heart catheterization.  Pressures were recorded from the right atrium through the pulmonary  capillary wedge position.  Oxygen saturations were drawn from a  pulmonary artery as well as the aorta.  Oxygen saturation dropped from  the pulmonary artery in the aorta.  Thermodilution cardiac output was  performed.   Following baseline hemodynamics, the pigtail catheter was inserted into  the left  ventricle and pressures were recorded.  A left ventriculogram  was done.  Selective coronary angiography was then performed using  standard 4-French catheters.  All catheter exchanges were performed over  a guidewire.  At the conclusion of the baseline diagnostic procedure,  adenosine was ordered from the pharmacy and we performed an adenosine  vasodilator challenge.  The patient developed increase in her pulmonary  capillary wedge pressure with adenosine and became mildly short of  breath.  She was given 40 mg of intravenous Lasix.  She soon became more  comfortable and was taken off the table at the conclusion of the case.  Her arterial and venous sheaths will be pulled and manual pressure used  for hemostasis.   FINDINGS:  Right atrial pressure 75/5 with a mean of 5, right  ventricular pressure of baseline was not performed.  Pulmonary artery  pressure 78/33 with a mean of 50.  Pulmonary capillary wedge pressures  14, A-wave 14 V-wave 15, mean of 12, left ventricular pressures 109/1  with an end-diastolic pressure of 10, aortic pressure is 112/75 with a  mean of 92.   Pulmonary artery saturation is 68%.  Aortic saturation 94%.  Cardiac  output by thermodilution was 3.2 liters per minute.  Cardiac output  by  thick is 3.4 liters per minute.  With adenosine, the pulmonary artery  pressure was 91/41 with a mean of 62.  Pulmonary capillary wedge  pressure was A-wave 48 V-wave 47, mean of 32.  Cardiac output was 3.2.  Pulmonary vascular resistance was 9.4 Wood units.  Of note, at the time  of the vasodilator challenge, the patient's systemic blood pressure was  higher in a range of 140/80.   CORONARY ANGIOGRAPHY:  Left mainstem is angiographically normal.  It  bifurcates into the LAD and left circumflex.  The LAD is a large-caliber  vessel that courses down to the left ventricular apex.  There is a large  first diagonal branch that bifurcates into twin vessels.  There is no  significant  angiographic disease in the LAD system.   Left circumflex is a large-caliber vessel that gives off a small first  obtuse marginal and medium caliber second obtuse marginal.  There is  also a left posterolateral branch present.  There is no significant  angiographic disease in the left circumflex system.   Right coronary artery is dominant.  It gives off a PDA branch as well as  the posterior AV segment that gives off one posterolateral branch.  There is a large acute marginal branch from the mid distal portion of  the vessel.  There is no significant angiographic disease in the right  coronary artery.   Left ventricular function is normal with a left ventricular ejection  fraction in the range of 55-60%.  There is severe mitral regurgitation  demonstrated.   ASSESSMENT:  1. Severe mitral regurgitation.  2. Severe pulmonary hypertension.  3. Low normal left ventricular function in the setting of severe MR      with a left ventricular ejection fraction of 55-60%.  4. Is negative vasodilator challenge with adenosine.   DISCUSSION:  Despite the fact that [name] did not have a positive  response to adenosine, I suspect her pulmonary hypertension stems from  her left heart disease.  There was a marked difference in her baseline  pulmonary pressure compared with her pulmonary pressure at the time of  her vasodilator challenge.  I suspect that the main reason for this is  that her sedation had worn off and her systemic blood pressure was  higher causing higher left ventricular end-diastolic pressure reflecting  in her pulmonary pressures.  After adenosine, her pulmonary capillary  wedge pressure continued to rise which again is reflective of  significant left heart disease, and her PA pressure continued to rise as  well.  I think that her best treatment would be repair or replacement of her mitral valve.  I will review these findings with Dr. Andee Lineman who is  seeing her back later this  week in clinic.      Veverly Fells. Excell Seltzer, MD  Electronically Signed     MDC/MEDQ  D:  03/26/2006  T:  03/26/2006  Job:  [id number]   cc:   Learta Codding, MD,FACC

## 2010-07-07 NOTE — Procedures (Signed)
NAMEBRANDE, UNCAPHER NO.:  [id number]   MEDICAL RECORD NO.:  [id number]          PATIENT TYPE:  OUT   LOCATION:                                FACILITY:  [facility]   PHYSICIAN:  Dani Gobble, MD       DATE OF BIRTH:  [birth year]   DATE OF PROCEDURE:  02/22/2005  DATE OF DISCHARGE:                                  ECHOCARDIOGRAM   REFERRING:  Dr. Phillips Odor.   INDICATION:  Cardiac murmur in a 40 year old. Technical quality of this  study is adequate.   The aorta measures normally at 3.2 cm.   Left atrium is moderately dilated. The patient appeared to be in sinus  rhythm.   The interventricular septum is mildly thickened at 1.3 cm while the  posterior wall is within normal limits at 1.1 cm.   The aortic valve is thin, trileaflet and pliable with normal leaflet  excursion. No significant aortic insufficiency is noted. Doppler  interrogation of the aortic valve is within normal limits.   Mitral valve appears moderately thickened on both its leaflets. There is a  mild hockey-stick appearance to the anterior leaflet. At times, it appears  to be somewhat tethered. There is severe mitral regurgitation noted. Doppler  interrogation of the mitral valve reveals a 10-mm mean pressure gradient  with an area by Doppler of 3.6 cm and planimetered area of 3.6 cm2.   The pulmonic valve appears grossly structurally normal with mild pulmonic  insufficiency noted.   Tricuspid valve appears grossly structurally normal with moderate tricuspid  regurgitation noted. Estimated RVSP is approximately 70  to 75 mmHg.   The left ventricle is at upper limits of normal in size with LVIDD measured  at 5.2 cm and LVISD measured at 3.5 cm. Overall left systolic function  appears to be normal. No regional wall motion abnormalities are noted. There  is flattening of the interventricular septum suggestive of right ventricular  pressure overload.   The right ventricle appears generous with  preserved right ventricle systolic  function. There does appear to be at least mild RVH. Right atrium appears  grossly normal in size. There is a small pericardial effusion without  evidence of hemodynamic compromise.   IMPRESSION:  1.  At least moderate left atrial enlargement.  2.  Mild ASH.  3.  Thickened and myxomatous appearing mitral valve leaflets with a      decrement in leaflet excursion and hockey-stick appearance. There at      times appears to be some tethering. Doppler interrogation of the mitral      valve suggests moderate to severe mitral stenosis while visualization      subjectively appears to be more of the mild to moderate range.      Calculated valve area and planimetered valve area suggest mild mitral      stenosis.  4.  Severe mitral regurgitation.  5.  Mild pulmonic insufficiency.  6.  Moderate tricuspid regurgitation.  7.  Severe pulmonary hypertension.  8.  Left ventricle at the upper limits of normal in size with normal overall  ejection fraction and no regional wall motion abnormalities noted.  9.  Generous right ventricle with preserved left systolic function and the      presence of RVH.  10. Flattening of the septum, again suggestive right ventricular pressure      overload.  11. Small pericardial effusion without evidence of hemodynamic compromise.           ______________________________  Dani Gobble, MD     AB/MEDQ  D:  02/22/2005  T:  02/23/2005  Job:  [id number]   cc:   Corrie Mckusick, M.D.  Fax: [phone]

## 2010-07-07 NOTE — Assessment & Plan Note (Signed)
[facility]                          [facility] OFFICE NOTE   NAME:[name], [name]                      MRN:          [id number]  DATE:04/01/2006                            DOB:          08-Jan-1971    HISTORY OF PRESENT ILLNESS:  The patient is a 40 year old female  recently diagnosed with severe mitral disease. 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  She was referred for a cardiac catheterization due to new onset symptoms  of congestive heart failure.  The patient was found to have severe  pulmonary hypertension with a significant gradient between pulmonary  artery diastolic pressure and wedge pressure. She was indeed found to  have severe mitral regurgitation by left ventriculography. She was found  to have a low normal ejection fraction estimated at 55-60% in the  setting of her mitral regurgitation. The patient had a negative  vasodilator challenge with adenosine fusion. The patient presents for  followup in the office today. She feels like she has slightly improved  in the level of dyspnea on Lasix and Lopressor. She was asked to come to  the office to discuss the catheterization results as well as plans to  proceed with a referral to [facility] for possible  mitral valve repair.   MEDICATIONS:  1. Lasix 20 mg p.o. daily.  2. Lopressor 25 mg 1/2-tablet p.o. b.i.d.  3. Wellbutrin 150 mg p.o. daily.   PHYSICAL EXAMINATION:  VITAL SIGNS:  Blood pressure is 104/70, heart  rate 64 beats per minute, weighs 157 pounds.  NECK:  Normal carotid upstrokes with elevated JVD approximately 10 cm,  no carotid bruits.  LUNGS:  Clear breath sounds bilaterally with faint crackles at the  bases.  HEART:  Regular rate and rhythm with a 3-4/6 holosystolic murmur at the  apex radiating into the axilla.  ABDOMEN:  Soft, nontender.  EXTREMITIES:  Reveals no cyanosis, clubbing or  edema.   PROBLEM LIST:  1. Rheumatic heart disease.      a.     Severe mitral regurgitation.      b.     Severe pulmonary hypertension.      c.     RV dysfunction.      d.     Severe tricuspid regurgitation.  2. No evidence of significant coronary artery disease.   PLAN:  1. I discussed at length with the patient the implications of the      finding of her catheterization and transesophageal      echocardiographic report. Although the patient has a negative      adenosine vasodilator challenge study and she has severe pulmonary      hypertension, I do not think she should be excluded from mitral      valve repair or replacement.  2. I placed a call to Dr. Modesto Charon at [facility] in      the office today and he is so kind to see the patient for further      evaluation later next week.  3. Anticipate  that the patient will need to undergo mitral valve      repair and as she has residual pulmonary hypertension, this can      then be treated with vasodilator therapy.     Learta Codding, MD,FACC  Electronically Signed    GED/MedQ  DD: 04/01/2006  DT: 04/01/2006  Job #: [id number]   cc:   Dr, Doyne Keel, Hospitalist Service  Corrie Mckusick, M.D.

## 2010-07-07 NOTE — Assessment & Plan Note (Signed)
[facility]                          [facility]ICE NOTE   NAME:[name]                      MRN:          [id number]  DATE:06/13/2006                            DOB:          05/15/1970    HISTORY OF PRESENT ILLNESS:  The patient is a 40 year old female with a  history of severe mitral valve disease, both mitral stenosis and mitral  regurgitation.  The patient was referred to [facility] for mitral valve replacement through keyhole procedures.  The  patient has done remarkably well.  Her only postoperative complication  was the post-pericardiotomy syndrome as well as anemia and  thrombocytopenia.  The patient has had no recurrent substernal chest  pain.  Her post-pericardiotomy syndrome appears to have resolved.  She  has no shortness of breath, orthopnea, PND, no palpitations or syncope.  She is followed closely by Dr. Phillips Odor in regards to her Coumadin  anticoagulation.  Goal INR are 2.5 to 3.5.   MEDICATIONS:  1. Lasix 40 mg p.o. daily.  2. Wellbutrin 150 mg p.o. daily.  3. Lopressor 25 mg half a tablet p.o. b.i.d.  4. Aspirin 81 mg p.o. daily.  5. Warfarin as directed.  6. Lotensin 5 mg p.o. daily.  7. Klor-Con 20 mg p.o. daily.   PHYSICAL EXAMINATION:  VITAL SIGNS:  Blood pressure is 90/62, heart rate  81 beats per minute, weight is 152 pounds.  NECK:  Normal carotid upstroke.  LUNGS:  Clear breath sounds bilaterally.  HEART:  Regular rate and rhythm with a normal closing click of S1.  There are no pathological murmurs.  There is no S3 or S4.  ABDOMEN:  Soft, nontender, no rebound or guarding.  EXTREMITIES:  Exam reveals evidence for Raynaud's phenomenon both in  upper and lower extremities.   PROBLEM LIST:  1. Rheumatic valvular heart disease, status post mitral valve      replacement on May 09, 2006.  2. Pulmonary hypertension.  3. Tricuspid regurgitation.  4. Raynaud's phenomenon.  5. Status  post pericardiotomy syndrome post mitral valve replacement.  6. Coumadin anticoagulation.   PLAN:  1. The patient is doing remarkably well from a cardiovascular      perspective, I made no change in her medical regimen.  2. Postoperative 2D echocardiogram has been ordered to establish      mitral valve gradients and to reassess the patient's pulmonary      hypertension.  In addition, the patient's left ventricular function      needs to be re-determined as intraoperatively it was estimated that      her ejection fraction was 40%, rather than 55% as estimated      preoperatively.  3. If the patient's ejection fraction is within normal limits, we      could certainly decide on discontinuing Lotensin.  I will see the      patient back in 3 months and we will decide on this, and also we      make a decision to stop aspirin if so indicated.  4. Coumadin anticoagulation will be provided by Dr.  Golding with goal      INR between 2.5 and 3.5.  5. CBC and a BMET were done today in the office.  6. The patient will follow up with me in 3 months.     Learta Codding, MD,FACC     GED/MedQ  DD: 06/13/2006  DT: 06/13/2006  Job #: [id number]   cc:   Corrie Mckusick, M.D.

## 2010-07-10 ENCOUNTER — Other Ambulatory Visit (HOSPITAL_COMMUNITY): Payer: Self-pay | Admitting: Oncology

## 2010-07-10 ENCOUNTER — Encounter (HOSPITAL_COMMUNITY): Payer: PRIVATE HEALTH INSURANCE | Attending: Oncology

## 2010-07-10 DIAGNOSIS — C50919 Malignant neoplasm of unspecified site of unspecified female breast: Secondary | ICD-10-CM

## 2010-07-10 DIAGNOSIS — M329 Systemic lupus erythematosus, unspecified: Secondary | ICD-10-CM | POA: Insufficient documentation

## 2010-07-10 DIAGNOSIS — D591 Autoimmune hemolytic anemia, unspecified: Secondary | ICD-10-CM | POA: Insufficient documentation

## 2010-07-10 LAB — DIFFERENTIAL
Basophils Absolute: 0 10*3/uL (ref 0.0–0.1)
Lymphocytes Relative: 28 % (ref 12–46)
Lymphs Abs: 0.9 10*3/uL (ref 0.7–4.0)
Monocytes Absolute: 0.2 10*3/uL (ref 0.1–1.0)
Neutro Abs: 2.2 10*3/uL (ref 1.7–7.7)

## 2010-07-10 LAB — CBC
HCT: 26.2 % — ABNORMAL LOW (ref 36.0–46.0)
Hemoglobin: 8.6 g/dL — ABNORMAL LOW (ref 12.0–15.0)
WBC: 3.4 10*3/uL — ABNORMAL LOW (ref 4.0–10.5)

## 2010-07-10 LAB — PROTIME-INR: Prothrombin Time: 22.4 seconds — ABNORMAL HIGH (ref 11.6–15.2)

## 2010-07-11 ENCOUNTER — Encounter (HOSPITAL_COMMUNITY): Payer: PRIVATE HEALTH INSURANCE

## 2010-07-12 ENCOUNTER — Ambulatory Visit: Payer: Self-pay | Admitting: *Deleted

## 2010-07-12 ENCOUNTER — Other Ambulatory Visit: Payer: Self-pay | Admitting: *Deleted

## 2010-07-12 DIAGNOSIS — Z7901 Long term (current) use of anticoagulants: Secondary | ICD-10-CM

## 2010-07-12 DIAGNOSIS — I059 Rheumatic mitral valve disease, unspecified: Secondary | ICD-10-CM

## 2010-07-12 DIAGNOSIS — Z954 Presence of other heart-valve replacement: Secondary | ICD-10-CM

## 2010-07-12 MED ORDER — WARFARIN SODIUM 5 MG PO TABS: ORAL_TABLET | ORAL | Status: DC

## 2010-07-14 ENCOUNTER — Encounter (HOSPITAL_COMMUNITY): Payer: PRIVATE HEALTH INSURANCE | Attending: Oncology | Admitting: Oncology

## 2010-07-14 DIAGNOSIS — C50919 Malignant neoplasm of unspecified site of unspecified female breast: Secondary | ICD-10-CM

## 2010-07-18 ENCOUNTER — Inpatient Hospital Stay (HOSPITAL_COMMUNITY): Payer: PRIVATE HEALTH INSURANCE

## 2010-07-19 ENCOUNTER — Encounter (HOSPITAL_COMMUNITY): Payer: PRIVATE HEALTH INSURANCE

## 2010-07-20 ENCOUNTER — Ambulatory Visit (INDEPENDENT_AMBULATORY_CARE_PROVIDER_SITE_OTHER): Payer: PRIVATE HEALTH INSURANCE | Admitting: *Deleted

## 2010-07-20 DIAGNOSIS — I059 Rheumatic mitral valve disease, unspecified: Secondary | ICD-10-CM

## 2010-07-20 DIAGNOSIS — Z954 Presence of other heart-valve replacement: Secondary | ICD-10-CM

## 2010-07-20 DIAGNOSIS — Z7901 Long term (current) use of anticoagulants: Secondary | ICD-10-CM

## 2010-07-21 ENCOUNTER — Encounter (HOSPITAL_COMMUNITY): Payer: PRIVATE HEALTH INSURANCE

## 2010-08-02 ENCOUNTER — Ambulatory Visit (INDEPENDENT_AMBULATORY_CARE_PROVIDER_SITE_OTHER): Payer: PRIVATE HEALTH INSURANCE | Admitting: *Deleted

## 2010-08-02 DIAGNOSIS — Z954 Presence of other heart-valve replacement: Secondary | ICD-10-CM

## 2010-08-02 DIAGNOSIS — Z7901 Long term (current) use of anticoagulants: Secondary | ICD-10-CM

## 2010-08-02 DIAGNOSIS — I059 Rheumatic mitral valve disease, unspecified: Secondary | ICD-10-CM

## 2010-08-03 NOTE — Progress Notes (Signed)
[name] am sure you're taking care of this

## 2010-08-04 ENCOUNTER — Ambulatory Visit (HOSPITAL_COMMUNITY): Payer: PRIVATE HEALTH INSURANCE | Attending: Oncology

## 2010-08-04 ENCOUNTER — Other Ambulatory Visit (HOSPITAL_COMMUNITY): Payer: Self-pay | Admitting: Oncology

## 2010-08-04 ENCOUNTER — Ambulatory Visit (HOSPITAL_COMMUNITY)
Admission: RE | Admit: 2010-08-04 | Discharge: 2010-08-04 | Disposition: A | Discharge status: Home / Self Care | Payer: PRIVATE HEALTH INSURANCE | Source: Ambulatory Visit | Attending: Oncology | Admitting: Oncology

## 2010-08-04 DIAGNOSIS — C50919 Malignant neoplasm of unspecified site of unspecified female breast: Secondary | ICD-10-CM | POA: Insufficient documentation

## 2010-08-04 DIAGNOSIS — Z01812 Encounter for preprocedural laboratory examination: Secondary | ICD-10-CM | POA: Insufficient documentation

## 2010-08-04 LAB — CBC
HCT: 29.2 % — ABNORMAL LOW (ref 36.0–46.0)
Hemoglobin: 9.8 g/dL — ABNORMAL LOW (ref 12.0–15.0)
MCH: 30.7 pg (ref 26.0–34.0)
MCV: 91.5 fL (ref 78.0–100.0)
RBC: 3.19 MIL/uL — ABNORMAL LOW (ref 3.87–5.11)

## 2010-08-04 LAB — COMPREHENSIVE METABOLIC PANEL
ALT: 17 U/L (ref 0–35)
CO2: 26 mEq/L (ref 19–32)
Calcium: 9.9 mg/dL (ref 8.4–10.5)
Creatinine, Ser: 0.59 mg/dL (ref 0.50–1.10)
GFR calc Af Amer: >60 mL/min (ref >60)
GFR calc non Af Amer: >60 mL/min (ref >60)
Glucose, Bld: 103 mg/dL — ABNORMAL HIGH (ref 70–99)
Sodium: 139 mEq/L (ref 135–145)

## 2010-08-04 LAB — VITAMIN D 25 HYDROXY (VIT D DEFICIENCY, FRACTURES): Vit D, 25-Hydroxy: 33 ng/mL (ref 30–89)

## 2010-08-04 LAB — TSH: TSH: 4.397 u[IU]/mL (ref 0.350–4.500)

## 2010-08-04 LAB — DIFFERENTIAL
Basophils Absolute: 0 10*3/uL (ref 0.0–0.1)
Eosinophils Relative: 10 % — ABNORMAL HIGH (ref 0–5)
Lymphocytes Relative: 34 % (ref 12–46)

## 2010-08-05 LAB — LIPID PANEL
HDL: 39 mg/dL — ABNORMAL LOW (ref >39)
LDL Cholesterol: 100 mg/dL — ABNORMAL HIGH (ref 0–99)

## 2010-08-07 ENCOUNTER — Encounter (HOSPITAL_COMMUNITY): Payer: PRIVATE HEALTH INSURANCE | Attending: Oncology

## 2010-08-07 ENCOUNTER — Ambulatory Visit (INDEPENDENT_AMBULATORY_CARE_PROVIDER_SITE_OTHER): Payer: PRIVATE HEALTH INSURANCE | Admitting: *Deleted

## 2010-08-07 DIAGNOSIS — Z5111 Encounter for antineoplastic chemotherapy: Secondary | ICD-10-CM

## 2010-08-07 DIAGNOSIS — Z954 Presence of other heart-valve replacement: Secondary | ICD-10-CM

## 2010-08-07 DIAGNOSIS — M329 Systemic lupus erythematosus, unspecified: Secondary | ICD-10-CM | POA: Insufficient documentation

## 2010-08-07 DIAGNOSIS — R52 Pain, unspecified: Secondary | ICD-10-CM

## 2010-08-07 DIAGNOSIS — D591 Autoimmune hemolytic anemia, unspecified: Secondary | ICD-10-CM | POA: Insufficient documentation

## 2010-08-07 DIAGNOSIS — Z7901 Long term (current) use of anticoagulants: Secondary | ICD-10-CM

## 2010-08-07 DIAGNOSIS — C50919 Malignant neoplasm of unspecified site of unspecified female breast: Secondary | ICD-10-CM

## 2010-08-07 DIAGNOSIS — I059 Rheumatic mitral valve disease, unspecified: Secondary | ICD-10-CM

## 2010-08-07 LAB — POCT INR: INR: 1.4

## 2010-08-08 ENCOUNTER — Encounter (HOSPITAL_COMMUNITY): Payer: PRIVATE HEALTH INSURANCE

## 2010-08-08 DIAGNOSIS — R52 Pain, unspecified: Secondary | ICD-10-CM

## 2010-08-08 DIAGNOSIS — C50919 Malignant neoplasm of unspecified site of unspecified female breast: Secondary | ICD-10-CM

## 2010-08-09 ENCOUNTER — Inpatient Hospital Stay (HOSPITAL_COMMUNITY): Payer: PRIVATE HEALTH INSURANCE

## 2010-08-09 ENCOUNTER — Ambulatory Visit (INDEPENDENT_AMBULATORY_CARE_PROVIDER_SITE_OTHER): Payer: PRIVATE HEALTH INSURANCE | Admitting: *Deleted

## 2010-08-09 ENCOUNTER — Encounter (HOSPITAL_COMMUNITY): Payer: PRIVATE HEALTH INSURANCE

## 2010-08-09 DIAGNOSIS — C50919 Malignant neoplasm of unspecified site of unspecified female breast: Secondary | ICD-10-CM

## 2010-08-09 DIAGNOSIS — R112 Nausea with vomiting, unspecified: Secondary | ICD-10-CM

## 2010-08-09 DIAGNOSIS — Z7901 Long term (current) use of anticoagulants: Secondary | ICD-10-CM

## 2010-08-09 DIAGNOSIS — M79609 Pain in unspecified limb: Secondary | ICD-10-CM

## 2010-08-09 DIAGNOSIS — Z954 Presence of other heart-valve replacement: Secondary | ICD-10-CM

## 2010-08-09 DIAGNOSIS — I059 Rheumatic mitral valve disease, unspecified: Secondary | ICD-10-CM

## 2010-08-09 LAB — POCT INR: INR: 3.7

## 2010-08-10 ENCOUNTER — Encounter (HOSPITAL_COMMUNITY): Payer: PRIVATE HEALTH INSURANCE

## 2010-08-10 DIAGNOSIS — C50919 Malignant neoplasm of unspecified site of unspecified female breast: Secondary | ICD-10-CM

## 2010-08-10 DIAGNOSIS — R112 Nausea with vomiting, unspecified: Secondary | ICD-10-CM

## 2010-08-10 DIAGNOSIS — M79609 Pain in unspecified limb: Secondary | ICD-10-CM

## 2010-08-11 ENCOUNTER — Encounter (HOSPITAL_COMMUNITY): Payer: PRIVATE HEALTH INSURANCE

## 2010-08-11 DIAGNOSIS — Z5189 Encounter for other specified aftercare: Secondary | ICD-10-CM

## 2010-08-11 DIAGNOSIS — C50919 Malignant neoplasm of unspecified site of unspecified female breast: Secondary | ICD-10-CM

## 2010-08-14 ENCOUNTER — Ambulatory Visit (INDEPENDENT_AMBULATORY_CARE_PROVIDER_SITE_OTHER): Payer: PRIVATE HEALTH INSURANCE | Admitting: *Deleted

## 2010-08-14 DIAGNOSIS — Z954 Presence of other heart-valve replacement: Secondary | ICD-10-CM

## 2010-08-14 DIAGNOSIS — I059 Rheumatic mitral valve disease, unspecified: Secondary | ICD-10-CM

## 2010-08-14 DIAGNOSIS — Z7901 Long term (current) use of anticoagulants: Secondary | ICD-10-CM

## 2010-08-14 LAB — CULTURE, BLOOD (ROUTINE X 2): Culture: NO GROWTH

## 2010-08-14 LAB — POCT INR: INR: 3.6

## 2010-08-15 ENCOUNTER — Other Ambulatory Visit (HOSPITAL_COMMUNITY): Payer: Self-pay | Admitting: Oncology

## 2010-08-15 ENCOUNTER — Encounter (HOSPITAL_COMMUNITY): Payer: PRIVATE HEALTH INSURANCE | Admitting: Oncology

## 2010-08-15 DIAGNOSIS — C50919 Malignant neoplasm of unspecified site of unspecified female breast: Secondary | ICD-10-CM

## 2010-08-15 LAB — DIFFERENTIAL
Basophils Absolute: 0.1 10*3/uL (ref 0.0–0.1)
Eosinophils Relative: 1 % (ref 0–5)
Lymphocytes Relative: 39 % (ref 12–46)
Neutrophils Relative %: 40 % — ABNORMAL LOW (ref 43–77)
WBC Morphology: INCREASED

## 2010-08-15 LAB — CBC
MCV: 90.3 fL (ref 78.0–100.0)
Platelets: 135 10*3/uL — ABNORMAL LOW (ref 150–400)
RDW: 15.2 % (ref 11.5–15.5)
WBC: 3.8 10*3/uL — ABNORMAL LOW (ref 4.0–10.5)

## 2010-08-15 LAB — BASIC METABOLIC PANEL
Calcium: 9.5 mg/dL (ref 8.4–10.5)
Chloride: 98 mEq/L (ref 96–112)
Creatinine, Ser: 0.7 mg/dL (ref 0.50–1.10)
GFR calc Af Amer: >60 mL/min (ref >60)
Sodium: 135 mEq/L (ref 135–145)

## 2010-08-15 LAB — CREATININE, SERUM: Creatine, Serum: 0.7

## 2010-08-16 ENCOUNTER — Other Ambulatory Visit (HOSPITAL_COMMUNITY): Payer: Self-pay | Admitting: Oncology

## 2010-08-16 ENCOUNTER — Encounter (HOSPITAL_COMMUNITY): Payer: Self-pay | Admitting: Oncology

## 2010-08-16 DIAGNOSIS — Z1371 Encounter for nonprocreative screening for genetic disease carrier status: Secondary | ICD-10-CM

## 2010-08-19 ENCOUNTER — Encounter (HOSPITAL_COMMUNITY): Payer: Self-pay | Admitting: Oncology

## 2010-08-21 ENCOUNTER — Other Ambulatory Visit (HOSPITAL_COMMUNITY): Payer: Self-pay | Admitting: Oncology

## 2010-08-21 ENCOUNTER — Encounter (HOSPITAL_COMMUNITY): Payer: PRIVATE HEALTH INSURANCE

## 2010-08-21 ENCOUNTER — Encounter (HOSPITAL_COMMUNITY): Payer: Self-pay | Admitting: Oncology

## 2010-08-21 ENCOUNTER — Encounter: Payer: PRIVATE HEALTH INSURANCE | Admitting: *Deleted

## 2010-08-21 DIAGNOSIS — C50919 Malignant neoplasm of unspecified site of unspecified female breast: Secondary | ICD-10-CM

## 2010-08-21 DIAGNOSIS — M329 Systemic lupus erythematosus, unspecified: Secondary | ICD-10-CM

## 2010-08-21 DIAGNOSIS — E538 Deficiency of other specified B group vitamins: Secondary | ICD-10-CM | POA: Insufficient documentation

## 2010-08-22 ENCOUNTER — Encounter (HOSPITAL_COMMUNITY): Payer: PRIVATE HEALTH INSURANCE | Attending: Oncology

## 2010-08-22 ENCOUNTER — Ambulatory Visit (INDEPENDENT_AMBULATORY_CARE_PROVIDER_SITE_OTHER): Payer: PRIVATE HEALTH INSURANCE | Admitting: *Deleted

## 2010-08-22 DIAGNOSIS — C50919 Malignant neoplasm of unspecified site of unspecified female breast: Secondary | ICD-10-CM | POA: Insufficient documentation

## 2010-08-22 DIAGNOSIS — I059 Rheumatic mitral valve disease, unspecified: Secondary | ICD-10-CM

## 2010-08-22 DIAGNOSIS — Z954 Presence of other heart-valve replacement: Secondary | ICD-10-CM

## 2010-08-22 DIAGNOSIS — R112 Nausea with vomiting, unspecified: Secondary | ICD-10-CM | POA: Insufficient documentation

## 2010-08-22 DIAGNOSIS — E538 Deficiency of other specified B group vitamins: Secondary | ICD-10-CM | POA: Insufficient documentation

## 2010-08-22 DIAGNOSIS — Z7901 Long term (current) use of anticoagulants: Secondary | ICD-10-CM

## 2010-08-22 DIAGNOSIS — D594 Other nonautoimmune hemolytic anemias: Secondary | ICD-10-CM | POA: Insufficient documentation

## 2010-08-25 ENCOUNTER — Other Ambulatory Visit (HOSPITAL_COMMUNITY): Payer: Self-pay | Admitting: Oncology

## 2010-08-25 ENCOUNTER — Encounter (HOSPITAL_COMMUNITY): Payer: PRIVATE HEALTH INSURANCE

## 2010-08-25 DIAGNOSIS — Z171 Estrogen receptor negative status [ER-]: Secondary | ICD-10-CM

## 2010-08-25 DIAGNOSIS — C50219 Malignant neoplasm of upper-inner quadrant of unspecified female breast: Secondary | ICD-10-CM

## 2010-08-25 LAB — CBC
MCHC: 33.9 g/dL (ref 30.0–36.0)
Platelets: 158 10*3/uL (ref 150–400)
RDW: 16.3 % — ABNORMAL HIGH (ref 11.5–15.5)

## 2010-08-25 LAB — COMPREHENSIVE METABOLIC PANEL
ALT: 16 U/L (ref 0–35)
Albumin: 3.6 g/dL (ref 3.5–5.2)
Alkaline Phosphatase: 91 U/L (ref 39–117)
Potassium: 3.2 mEq/L — ABNORMAL LOW (ref 3.5–5.1)
Sodium: 141 mEq/L (ref 135–145)
Total Protein: 7.6 g/dL (ref 6.0–8.3)

## 2010-08-25 LAB — DIFFERENTIAL
Basophils Absolute: 0.1 10*3/uL (ref 0.0–0.1)
Basophils Relative: 1 % (ref 0–1)
Eosinophils Relative: 0 % (ref 0–5)
Monocytes Absolute: 0.3 10*3/uL (ref 0.1–1.0)

## 2010-08-28 ENCOUNTER — Other Ambulatory Visit (HOSPITAL_COMMUNITY): Payer: Self-pay | Admitting: Oncology

## 2010-08-28 ENCOUNTER — Encounter (HOSPITAL_BASED_OUTPATIENT_CLINIC_OR_DEPARTMENT_OTHER): Payer: PRIVATE HEALTH INSURANCE

## 2010-08-28 VITALS — BP 120/82 | HR 85 | Temp 98.7°F | Wt 166.4 lb

## 2010-08-28 DIAGNOSIS — Z5111 Encounter for antineoplastic chemotherapy: Secondary | ICD-10-CM

## 2010-08-28 DIAGNOSIS — C50919 Malignant neoplasm of unspecified site of unspecified female breast: Secondary | ICD-10-CM

## 2010-08-28 MED ORDER — HEPARIN SOD (PORK) LOCK FLUSH 100 UNIT/ML IV SOLN: 500.0000 [IU] | Freq: Once | INTRAVENOUS | Status: DC | PRN

## 2010-08-28 MED ORDER — HYDROMORPHONE HCL 1 MG/ML IJ SOLN
INTRAMUSCULAR | Status: AC
  Administered 2010-08-28: 14:00:00 via INTRAVENOUS
  Filled 2010-08-28: qty 1

## 2010-08-28 MED ORDER — SODIUM CHLORIDE 0.9 % IV SOLN: INTRAVENOUS | Status: DC

## 2010-08-28 MED ORDER — HYDROMORPHONE HCL 1 MG/ML IJ SOLN
INTRAMUSCULAR | Status: AC
  Administered 2010-08-28: 12:00:00 via INTRAVENOUS
  Filled 2010-08-28: qty 1

## 2010-08-28 MED ORDER — SODIUM CHLORIDE 0.9 % IJ SOLN
INTRAMUSCULAR | Status: AC
  Administered 2010-08-28: 10 mL
  Filled 2010-08-28: qty 20

## 2010-08-28 MED ORDER — SODIUM CHLORIDE 0.9 % IV SOLN
INTRAVENOUS | Status: DC
  Administered 2010-08-28: 09:00:00 via INTRAVENOUS

## 2010-08-28 MED ORDER — EPINEPHRINE HCL 0.1 MG/ML IJ SOLN
0.2500 mg | Freq: Once | INTRAMUSCULAR | Status: DC | PRN
  Filled 2010-08-28: qty 10

## 2010-08-28 MED ORDER — ALTEPLASE 2 MG IJ SOLR
2.0000 mg | Freq: Once | INTRAMUSCULAR | Status: DC | PRN
  Filled 2010-08-28: qty 2

## 2010-08-28 MED ORDER — DIPHENHYDRAMINE HCL 50 MG/ML IJ SOLN: 25.0000 mg | Freq: Once | INTRAMUSCULAR | Status: DC | PRN

## 2010-08-28 MED ORDER — PROMETHAZINE HCL 25 MG/ML IJ SOLN
25.0000 mg | Freq: Four times a day (QID) | INTRAMUSCULAR | Status: DC | PRN
  Administered 2010-08-28: 25 mg via INTRAVENOUS
  Filled 2010-08-28: qty 1

## 2010-08-28 MED ORDER — ALBUTEROL SULFATE (2.5 MG/3ML) 0.083% IN NEBU
2.5000 mg | INHALATION_SOLUTION | Freq: Once | RESPIRATORY_TRACT | Status: DC | PRN
  Filled 2010-08-28: qty 3

## 2010-08-28 MED ORDER — DIPHENHYDRAMINE HCL 50 MG/ML IJ SOLN: 50.0000 mg | Freq: Once | INTRAMUSCULAR | Status: DC | PRN

## 2010-08-28 MED ORDER — HEPARIN SOD (PORK) LOCK FLUSH 100 UNIT/ML IV SOLN: 250.0000 [IU] | Freq: Once | INTRAVENOUS | Status: DC | PRN

## 2010-08-28 MED ORDER — LORAZEPAM 2 MG/ML IJ SOLN
INTRAMUSCULAR | Status: AC
  Administered 2010-08-28: 11:00:00 via INTRAVENOUS
  Filled 2010-08-28: qty 1

## 2010-08-28 MED ORDER — METHYLPREDNISOLONE SODIUM SUCC 125 MG IJ SOLR
125.0000 mg | Freq: Once | INTRAMUSCULAR | Status: DC | PRN
  Filled 2010-08-28: qty 2

## 2010-08-28 MED ORDER — HEPARIN SOD (PORK) LOCK FLUSH 100 UNIT/ML IV SOLN
INTRAVENOUS | Status: AC
  Filled 2010-08-28: qty 5

## 2010-08-28 MED ORDER — SODIUM CHLORIDE 0.9 % IJ SOLN: 3.0000 mL | INTRAMUSCULAR | Status: DC | PRN

## 2010-08-28 MED ORDER — SODIUM CHLORIDE 0.9 % IJ SOLN
INTRAMUSCULAR | Status: AC
  Filled 2010-08-28: qty 20

## 2010-08-28 MED ORDER — SODIUM CHLORIDE 0.9 % IJ SOLN
10.0000 mL | INTRAMUSCULAR | Status: DC | PRN
  Administered 2010-08-28: 10 mL

## 2010-08-28 MED ORDER — COLD PACK MISC ONCOLOGY: 1.0000 | Freq: Once | Status: DC | PRN

## 2010-08-29 ENCOUNTER — Encounter (HOSPITAL_BASED_OUTPATIENT_CLINIC_OR_DEPARTMENT_OTHER): Payer: PRIVATE HEALTH INSURANCE

## 2010-08-29 ENCOUNTER — Other Ambulatory Visit (HOSPITAL_COMMUNITY): Payer: Self-pay | Admitting: Oncology

## 2010-08-29 VITALS — BP 97/64 | HR 99 | Temp 98.1°F | Wt 165.2 lb

## 2010-08-29 DIAGNOSIS — IMO0001 Reserved for inherently not codable concepts without codable children: Secondary | ICD-10-CM

## 2010-08-29 DIAGNOSIS — E860 Dehydration: Secondary | ICD-10-CM

## 2010-08-29 DIAGNOSIS — Z5111 Encounter for antineoplastic chemotherapy: Secondary | ICD-10-CM

## 2010-08-29 DIAGNOSIS — C50919 Malignant neoplasm of unspecified site of unspecified female breast: Secondary | ICD-10-CM

## 2010-08-29 MED ORDER — SODIUM CHLORIDE 0.9 % IJ SOLN
INTRAMUSCULAR | Status: AC
  Administered 2010-08-29: 14:00:00
  Filled 2010-08-29: qty 10

## 2010-08-29 MED ORDER — HYDROMORPHONE HCL 1 MG/ML IJ SOLN
INTRAMUSCULAR | Status: AC
  Administered 2010-08-29: 1 mg via INTRAVENOUS
  Filled 2010-08-29: qty 1

## 2010-08-29 MED ORDER — PROMETHAZINE HCL 25 MG/ML IJ SOLN
25.0000 mg | Freq: Four times a day (QID) | INTRAMUSCULAR | Status: DC | PRN
  Administered 2010-08-29: 25 mg via INTRAVENOUS
  Filled 2010-08-29: qty 1

## 2010-08-29 MED ORDER — SODIUM CHLORIDE 0.9 % IV SOLN
24.0000 mg | Freq: Once | INTRAVENOUS | Status: AC
  Administered 2010-08-29: 24 mg via INTRAVENOUS
  Filled 2010-08-29: qty 12

## 2010-08-29 MED ORDER — SODIUM CHLORIDE 0.9 % IJ SOLN
INTRAMUSCULAR | Status: AC
  Administered 2010-08-29: 09:00:00
  Filled 2010-08-29: qty 10

## 2010-08-29 MED ORDER — LORAZEPAM 2 MG/ML IJ SOLN: 1.0000 mg | INTRAMUSCULAR | Status: DC | PRN

## 2010-08-29 MED ORDER — SODIUM CHLORIDE 0.9 % IV SOLN
INTRAVENOUS | Status: DC
  Administered 2010-08-29: 09:00:00 via INTRAVENOUS

## 2010-08-29 MED ORDER — HYDROMORPHONE HCL 4 MG/ML IJ SOLN
1.0000 mg | INTRAMUSCULAR | Status: DC | PRN
  Filled 2010-08-29: qty 1

## 2010-08-29 MED ORDER — HYDROMORPHONE HCL 1 MG/ML IJ SOLN
INTRAMUSCULAR | Status: AC
  Administered 2010-08-29: 10:00:00
  Filled 2010-08-29: qty 1

## 2010-08-29 MED ORDER — LORAZEPAM 2 MG/ML IJ SOLN
INTRAMUSCULAR | Status: AC
  Administered 2010-08-29: 10:00:00
  Filled 2010-08-29: qty 1

## 2010-08-29 MED ORDER — HEPARIN SOD (PORK) LOCK FLUSH 100 UNIT/ML IV SOLN
INTRAVENOUS | Status: AC
  Filled 2010-08-29: qty 5

## 2010-08-30 ENCOUNTER — Ambulatory Visit (INDEPENDENT_AMBULATORY_CARE_PROVIDER_SITE_OTHER): Payer: PRIVATE HEALTH INSURANCE | Admitting: *Deleted

## 2010-08-30 ENCOUNTER — Encounter (HOSPITAL_BASED_OUTPATIENT_CLINIC_OR_DEPARTMENT_OTHER): Payer: PRIVATE HEALTH INSURANCE

## 2010-08-30 ENCOUNTER — Encounter: Payer: PRIVATE HEALTH INSURANCE | Admitting: *Deleted

## 2010-08-30 VITALS — BP 136/80 | HR 80 | Temp 97.8°F

## 2010-08-30 DIAGNOSIS — I059 Rheumatic mitral valve disease, unspecified: Secondary | ICD-10-CM

## 2010-08-30 DIAGNOSIS — Z954 Presence of other heart-valve replacement: Secondary | ICD-10-CM

## 2010-08-30 DIAGNOSIS — Z7901 Long term (current) use of anticoagulants: Secondary | ICD-10-CM

## 2010-08-30 DIAGNOSIS — C50919 Malignant neoplasm of unspecified site of unspecified female breast: Secondary | ICD-10-CM

## 2010-08-30 LAB — POCT INR: INR: 3.9

## 2010-08-30 MED ORDER — PEGFILGRASTIM INJECTION 6 MG/0.6ML
SUBCUTANEOUS | Status: AC
  Filled 2010-08-30: qty 0.6

## 2010-08-30 MED ORDER — SODIUM CHLORIDE 0.9 % IV SOLN
INTRAVENOUS | Status: DC
  Administered 2010-08-30 (×2): via INTRAVENOUS

## 2010-08-30 MED ORDER — HYDROMORPHONE HCL 1 MG/ML IJ SOLN
1.0000 mg | INTRAMUSCULAR | Status: DC | PRN
  Administered 2010-08-30: 1 mg via INTRAVENOUS

## 2010-08-30 MED ORDER — LORAZEPAM 2 MG/ML IJ SOLN
1.0000 mg | Freq: Once | INTRAMUSCULAR | Status: AC
  Administered 2010-08-30: 1 mg via INTRAVENOUS

## 2010-08-30 MED ORDER — HYDROMORPHONE HCL 1 MG/ML IJ SOLN
INTRAMUSCULAR | Status: AC
  Filled 2010-08-30: qty 1

## 2010-08-30 MED ORDER — SODIUM CHLORIDE 0.9 % IJ SOLN
INTRAMUSCULAR | Status: AC
  Filled 2010-08-30: qty 10

## 2010-08-30 MED ORDER — HEPARIN SOD (PORK) LOCK FLUSH 100 UNIT/ML IV SOLN
INTRAVENOUS | Status: AC
  Filled 2010-08-30: qty 5

## 2010-08-30 MED ORDER — PEGFILGRASTIM INJECTION 6 MG/0.6ML
6.0000 mg | Freq: Once | SUBCUTANEOUS | Status: AC
  Administered 2010-08-30: 6 mg via SUBCUTANEOUS

## 2010-08-30 MED ORDER — LORAZEPAM 2 MG/ML IJ SOLN
INTRAMUSCULAR | Status: AC
  Filled 2010-08-30: qty 1

## 2010-08-30 MED ORDER — PROMETHAZINE HCL 25 MG/ML IJ SOLN
25.0000 mg | Freq: Four times a day (QID) | INTRAMUSCULAR | Status: DC | PRN
  Administered 2010-08-30: 25 mg via INTRAVENOUS

## 2010-08-30 MED ORDER — SODIUM CHLORIDE 0.9 % IV SOLN
24.0000 mg | Freq: Once | INTRAVENOUS | Status: AC
  Administered 2010-08-30: 24 mg via INTRAVENOUS
  Filled 2010-08-30: qty 12

## 2010-08-30 MED ORDER — PROMETHAZINE HCL 25 MG/ML IJ SOLN
INTRAMUSCULAR | Status: AC
  Filled 2010-08-30: qty 1

## 2010-08-31 ENCOUNTER — Other Ambulatory Visit (HOSPITAL_COMMUNITY): Payer: Self-pay | Admitting: Oncology

## 2010-08-31 ENCOUNTER — Encounter (HOSPITAL_BASED_OUTPATIENT_CLINIC_OR_DEPARTMENT_OTHER): Payer: PRIVATE HEALTH INSURANCE

## 2010-08-31 VITALS — BP 110/77 | HR 109 | Temp 99.0°F

## 2010-08-31 DIAGNOSIS — IMO0001 Reserved for inherently not codable concepts without codable children: Secondary | ICD-10-CM

## 2010-08-31 DIAGNOSIS — C50919 Malignant neoplasm of unspecified site of unspecified female breast: Secondary | ICD-10-CM

## 2010-08-31 DIAGNOSIS — Z5111 Encounter for antineoplastic chemotherapy: Secondary | ICD-10-CM

## 2010-08-31 DIAGNOSIS — E860 Dehydration: Secondary | ICD-10-CM

## 2010-08-31 MED ORDER — LORAZEPAM 2 MG/ML IJ SOLN
1.0000 mg | Freq: Once | INTRAMUSCULAR | Status: AC
  Administered 2010-08-31: 14:00:00 via INTRAVENOUS

## 2010-08-31 MED ORDER — HYDROMORPHONE HCL 1 MG/ML IJ SOLN
INTRAMUSCULAR | Status: AC
  Filled 2010-08-31: qty 1

## 2010-08-31 MED ORDER — LORAZEPAM 2 MG/ML IJ SOLN
1.0000 mg | Freq: Once | INTRAMUSCULAR | Status: AC
  Administered 2010-08-31: 1 mg via INTRAVENOUS

## 2010-08-31 MED ORDER — HYDROMORPHONE HCL 1 MG/ML IJ SOLN
1.0000 mg | INTRAMUSCULAR | Status: DC | PRN
  Administered 2010-08-31 (×2): 1 mg via INTRAVENOUS

## 2010-08-31 MED ORDER — PROMETHAZINE HCL 25 MG/ML IJ SOLN
25.0000 mg | Freq: Four times a day (QID) | INTRAMUSCULAR | Status: DC | PRN
  Administered 2010-08-31: 25 mg via INTRAVENOUS

## 2010-08-31 MED ORDER — SODIUM CHLORIDE 0.9 % IV SOLN
INTRAVENOUS | Status: DC
  Administered 2010-08-31: 10:00:00 via INTRAVENOUS

## 2010-08-31 MED ORDER — PROMETHAZINE HCL 25 MG/ML IJ SOLN
INTRAMUSCULAR | Status: AC
  Filled 2010-08-31: qty 1

## 2010-08-31 MED ORDER — STERILE WATER FOR INJECTION IJ SOLN
INTRAMUSCULAR | Status: AC
  Filled 2010-08-31: qty 10

## 2010-08-31 MED ORDER — LORAZEPAM 2 MG/ML IJ SOLN
INTRAMUSCULAR | Status: AC
  Filled 2010-08-31: qty 1

## 2010-08-31 MED ORDER — SODIUM CHLORIDE 0.9 % IJ SOLN
INTRAMUSCULAR | Status: AC
  Filled 2010-08-31: qty 10

## 2010-08-31 MED ORDER — SODIUM CHLORIDE 0.9 % IV SOLN
24.0000 mg | Freq: Once | INTRAVENOUS | Status: AC
  Administered 2010-08-31: 24 mg via INTRAVENOUS
  Filled 2010-08-31: qty 12

## 2010-09-01 ENCOUNTER — Encounter (HOSPITAL_BASED_OUTPATIENT_CLINIC_OR_DEPARTMENT_OTHER): Payer: PRIVATE HEALTH INSURANCE

## 2010-09-01 ENCOUNTER — Other Ambulatory Visit (HOSPITAL_COMMUNITY): Payer: Self-pay | Admitting: Oncology

## 2010-09-01 VITALS — BP 102/71 | HR 109 | Temp 98.3°F | Wt 163.2 lb

## 2010-09-01 DIAGNOSIS — R52 Pain, unspecified: Secondary | ICD-10-CM

## 2010-09-01 DIAGNOSIS — C50919 Malignant neoplasm of unspecified site of unspecified female breast: Secondary | ICD-10-CM

## 2010-09-01 DIAGNOSIS — Z5111 Encounter for antineoplastic chemotherapy: Secondary | ICD-10-CM

## 2010-09-01 MED ORDER — LORAZEPAM 2 MG/ML IJ SOLN
1.0000 mg | Freq: Once | INTRAMUSCULAR | Status: AC
  Administered 2010-09-01: 12:00:00 via INTRAVENOUS

## 2010-09-01 MED ORDER — PROMETHAZINE HCL 25 MG/ML IJ SOLN
25.0000 mg | Freq: Four times a day (QID) | INTRAMUSCULAR | Status: DC | PRN
  Administered 2010-09-01: 09:00:00 via INTRAVENOUS

## 2010-09-01 MED ORDER — SODIUM CHLORIDE 0.9 % IJ SOLN
INTRAMUSCULAR | Status: AC
  Filled 2010-09-01: qty 40

## 2010-09-01 MED ORDER — LORAZEPAM 2 MG/ML IJ SOLN
INTRAMUSCULAR | Status: AC
  Administered 2010-09-01: 1 mg via INTRAVENOUS
  Filled 2010-09-01: qty 1

## 2010-09-01 MED ORDER — LORAZEPAM 2 MG/ML IJ SOLN
1.0000 mg | Freq: Once | INTRAMUSCULAR | Status: AC
  Administered 2010-09-01: 1 mg via INTRAVENOUS

## 2010-09-01 MED ORDER — SODIUM CHLORIDE 0.9 % IV SOLN
24.0000 mg | Freq: Once | INTRAVENOUS | Status: AC
  Administered 2010-09-01: 24 mg via INTRAVENOUS
  Filled 2010-09-01: qty 12

## 2010-09-01 MED ORDER — HEPARIN SOD (PORK) LOCK FLUSH 100 UNIT/ML IV SOLN: 500.0000 [IU] | Freq: Once | INTRAVENOUS | Status: DC

## 2010-09-01 MED ORDER — SODIUM CHLORIDE 0.9 % IJ SOLN
INTRAMUSCULAR | Status: AC
  Filled 2010-09-01: qty 10

## 2010-09-01 MED ORDER — SODIUM CHLORIDE 0.9 % IJ SOLN
10.0000 mL | INTRAMUSCULAR | Status: DC | PRN
  Administered 2010-09-01 (×3): via INTRAVENOUS

## 2010-09-01 MED ORDER — SODIUM CHLORIDE 0.9 % IV SOLN
INTRAVENOUS | Status: DC
  Administered 2010-09-01: 09:00:00 via INTRAVENOUS

## 2010-09-01 MED ORDER — HYDROMORPHONE HCL 2 MG PO TABS: 2.0000 mg | ORAL_TABLET | ORAL | Status: AC | PRN

## 2010-09-01 MED ORDER — HYDROMORPHONE HCL 1 MG/ML IJ SOLN
INTRAMUSCULAR | Status: AC
  Filled 2010-09-01: qty 1

## 2010-09-01 MED ORDER — LORAZEPAM 2 MG/ML IJ SOLN
INTRAMUSCULAR | Status: AC
  Filled 2010-09-01: qty 1

## 2010-09-01 MED ORDER — HYDROMORPHONE HCL 1 MG/ML IJ SOLN
INTRAMUSCULAR | Status: AC
  Administered 2010-09-01: 1 mg via INTRAVENOUS
  Filled 2010-09-01: qty 1

## 2010-09-01 MED ORDER — HYDROMORPHONE HCL 1 MG/ML IJ SOLN
1.0000 mg | INTRAMUSCULAR | Status: DC | PRN
  Administered 2010-09-01: 1 mg via INTRAVENOUS
  Administered 2010-09-01: 09:00:00 via INTRAVENOUS

## 2010-09-01 MED ORDER — PROMETHAZINE HCL 25 MG/ML IJ SOLN
INTRAMUSCULAR | Status: AC
  Filled 2010-09-01: qty 1

## 2010-09-04 ENCOUNTER — Encounter: Payer: PRIVATE HEALTH INSURANCE | Admitting: *Deleted

## 2010-09-04 ENCOUNTER — Encounter (HOSPITAL_BASED_OUTPATIENT_CLINIC_OR_DEPARTMENT_OTHER): Payer: PRIVATE HEALTH INSURANCE

## 2010-09-04 DIAGNOSIS — C50919 Malignant neoplasm of unspecified site of unspecified female breast: Secondary | ICD-10-CM

## 2010-09-04 DIAGNOSIS — R112 Nausea with vomiting, unspecified: Secondary | ICD-10-CM

## 2010-09-04 LAB — BASIC METABOLIC PANEL
Chloride: 101 mEq/L (ref 96–112)
GFR calc Af Amer: >60 mL/min (ref >60)
Potassium: 3.7 mEq/L (ref 3.5–5.1)
Sodium: 134 mEq/L — ABNORMAL LOW (ref 135–145)

## 2010-09-04 MED ORDER — HYDROMORPHONE HCL 1 MG/ML IJ SOLN
INTRAMUSCULAR | Status: AC
  Filled 2010-09-04: qty 1

## 2010-09-04 MED ORDER — PALONOSETRON HCL INJECTION 0.25 MG/5ML
0.2500 mg | Freq: Once | INTRAVENOUS | Status: AC
  Administered 2010-09-04: 0.25 mg via INTRAVENOUS
  Filled 2010-09-04: qty 5

## 2010-09-04 MED ORDER — DEXAMETHASONE SODIUM PHOSPHATE 10 MG/ML IJ SOLN: 16.0000 mg | Freq: Once | INTRAMUSCULAR | Status: DC

## 2010-09-04 MED ORDER — LORAZEPAM 2 MG/ML IJ SOLN
INTRAMUSCULAR | Status: AC
  Administered 2010-09-04: 1 mg via INTRAVENOUS
  Filled 2010-09-04: qty 1

## 2010-09-04 MED ORDER — DEXAMETHASONE SODIUM PHOSPHATE 10 MG/ML IJ SOLN
16.0000 mg | Freq: Once | INTRAMUSCULAR | Status: DC
  Filled 2010-09-04: qty 1.6

## 2010-09-04 MED ORDER — LORAZEPAM 2 MG/ML IJ SOLN
1.0000 mg | INTRAMUSCULAR | Status: AC
  Administered 2010-09-04 (×2): 1 mg via INTRAVENOUS

## 2010-09-04 MED ORDER — HYDROMORPHONE HCL 1 MG/ML IJ SOLN
1.0000 mg | Freq: Once | INTRAMUSCULAR | Status: AC
  Administered 2010-09-04: 1 mg via INTRAVENOUS

## 2010-09-04 MED ORDER — SODIUM CHLORIDE 0.9 % IV SOLN
INTRAVENOUS | Status: AC
  Administered 2010-09-04: 11:00:00 via INTRAVENOUS

## 2010-09-04 MED ORDER — SODIUM CHLORIDE 0.9 % IV SOLN
Freq: Once | INTRAVENOUS | Status: AC
  Administered 2010-09-04: 09:00:00 via INTRAVENOUS
  Filled 2010-09-04: qty 1.6

## 2010-09-04 MED ORDER — LORAZEPAM 2 MG/ML IJ SOLN
INTRAMUSCULAR | Status: AC
  Filled 2010-09-04: qty 1

## 2010-09-04 NOTE — Progress Notes (Signed)
Documented on wrong line.  See above note.

## 2010-09-04 NOTE — Progress Notes (Signed)
Documented on incorrect line, see peripheral line above.

## 2010-09-06 ENCOUNTER — Inpatient Hospital Stay (HOSPITAL_COMMUNITY)
Admission: EM | Admit: 2010-09-06 | Discharge: 2010-09-12 | DRG: 392 | Disposition: A | Discharge status: Home / Self Care | Payer: PRIVATE HEALTH INSURANCE | Attending: Internal Medicine | Admitting: Internal Medicine

## 2010-09-06 ENCOUNTER — Telehealth (HOSPITAL_COMMUNITY): Payer: Self-pay | Admitting: *Deleted

## 2010-09-06 ENCOUNTER — Encounter (HOSPITAL_COMMUNITY): Payer: Self-pay | Admitting: *Deleted

## 2010-09-06 ENCOUNTER — Emergency Department (HOSPITAL_COMMUNITY): Payer: PRIVATE HEALTH INSURANCE

## 2010-09-06 DIAGNOSIS — I059 Rheumatic mitral valve disease, unspecified: Secondary | ICD-10-CM

## 2010-09-06 DIAGNOSIS — Z954 Presence of other heart-valve replacement: Secondary | ICD-10-CM

## 2010-09-06 DIAGNOSIS — R5081 Fever presenting with conditions classified elsewhere: Secondary | ICD-10-CM

## 2010-09-06 DIAGNOSIS — T451X5A Adverse effect of antineoplastic and immunosuppressive drugs, initial encounter: Secondary | ICD-10-CM | POA: Diagnosis present

## 2010-09-06 DIAGNOSIS — E876 Hypokalemia: Secondary | ICD-10-CM | POA: Diagnosis present

## 2010-09-06 DIAGNOSIS — R1115 Cyclical vomiting syndrome unrelated to migraine: Secondary | ICD-10-CM

## 2010-09-06 DIAGNOSIS — K121 Other forms of stomatitis: Secondary | ICD-10-CM | POA: Diagnosis present

## 2010-09-06 DIAGNOSIS — Z952 Presence of prosthetic heart valve: Secondary | ICD-10-CM | POA: Diagnosis present

## 2010-09-06 DIAGNOSIS — M329 Systemic lupus erythematosus, unspecified: Secondary | ICD-10-CM

## 2010-09-06 DIAGNOSIS — E538 Deficiency of other specified B group vitamins: Secondary | ICD-10-CM | POA: Diagnosis present

## 2010-09-06 DIAGNOSIS — D599 Acquired hemolytic anemia, unspecified: Secondary | ICD-10-CM | POA: Diagnosis present

## 2010-09-06 DIAGNOSIS — R112 Nausea with vomiting, unspecified: Principal | ICD-10-CM | POA: Diagnosis present

## 2010-09-06 DIAGNOSIS — C50919 Malignant neoplasm of unspecified site of unspecified female breast: Secondary | ICD-10-CM | POA: Diagnosis present

## 2010-09-06 DIAGNOSIS — Z7901 Long term (current) use of anticoagulants: Secondary | ICD-10-CM

## 2010-09-06 DIAGNOSIS — D6481 Anemia due to antineoplastic chemotherapy: Secondary | ICD-10-CM | POA: Diagnosis present

## 2010-09-06 DIAGNOSIS — E860 Dehydration: Secondary | ICD-10-CM | POA: Diagnosis present

## 2010-09-06 HISTORY — DX: Anxiety disorder, unspecified: F41.9

## 2010-09-06 HISTORY — DX: Fibromyalgia: M79.7

## 2010-09-06 LAB — COMPREHENSIVE METABOLIC PANEL
ALT: 21 U/L (ref 0–35)
AST: 28 U/L (ref 0–37)
Alkaline Phosphatase: 122 U/L — ABNORMAL HIGH (ref 39–117)
CO2: 27 mEq/L (ref 19–32)
Chloride: 98 mEq/L (ref 96–112)
GFR calc non Af Amer: >60 mL/min (ref >60)
Glucose, Bld: 92 mg/dL (ref 70–99)
Potassium: 3 mEq/L — ABNORMAL LOW (ref 3.5–5.1)
Sodium: 137 mEq/L (ref 135–145)
Total Bilirubin: 0.4 mg/dL (ref 0.3–1.2)

## 2010-09-06 LAB — URINALYSIS, ROUTINE W REFLEX MICROSCOPIC
Bilirubin Urine: NEGATIVE
Glucose, UA: NEGATIVE mg/dL
Ketones, ur: NEGATIVE mg/dL
Protein, ur: NEGATIVE mg/dL

## 2010-09-06 LAB — DIFFERENTIAL
Basophils Relative: 1 % (ref 0–1)
Lymphocytes Relative: 21 % (ref 12–46)
Lymphs Abs: 1.5 10*3/uL (ref 0.7–4.0)
Monocytes Relative: 8 % (ref 3–12)
Neutro Abs: 5.1 10*3/uL (ref 1.7–7.7)

## 2010-09-06 LAB — URINE MICROSCOPIC-ADD ON

## 2010-09-06 LAB — CBC
HCT: 28.3 % — ABNORMAL LOW (ref 36.0–46.0)
Hemoglobin: 9.8 g/dL — ABNORMAL LOW (ref 12.0–15.0)
MCHC: 34.6 g/dL (ref 30.0–36.0)
MCV: 91.3 fL (ref 78.0–100.0)
WBC: 7.3 10*3/uL (ref 4.0–10.5)

## 2010-09-06 MED ORDER — PROMETHAZINE HCL 12.5 MG PO TABS: 12.5000 mg | ORAL_TABLET | Freq: Four times a day (QID) | ORAL | Status: DC | PRN

## 2010-09-06 MED ORDER — LORAZEPAM 2 MG/ML IJ SOLN
1.0000 mg | Freq: Once | INTRAMUSCULAR | Status: AC
  Administered 2010-09-06: 1 mg via INTRAVENOUS
  Filled 2010-09-06: qty 1

## 2010-09-06 MED ORDER — HYDROMORPHONE HCL 1 MG/ML IJ SOLN
1.0000 mg | INTRAMUSCULAR | Status: DC | PRN
  Administered 2010-09-06 – 2010-09-07 (×3): 1 mg via INTRAVENOUS
  Filled 2010-09-06 (×3): qty 1

## 2010-09-06 MED ORDER — SODIUM CHLORIDE 0.9 % IJ SOLN
INTRAMUSCULAR | Status: AC
  Filled 2010-09-06: qty 10

## 2010-09-06 MED ORDER — ONDANSETRON HCL 4 MG/2ML IJ SOLN
4.0000 mg | Freq: Once | INTRAMUSCULAR | Status: AC
  Administered 2010-09-06: 4 mg via INTRAVENOUS
  Filled 2010-09-06: qty 2

## 2010-09-06 MED ORDER — PROMETHAZINE HCL 25 MG/ML IJ SOLN
12.5000 mg | Freq: Four times a day (QID) | INTRAMUSCULAR | Status: DC | PRN
  Administered 2010-09-06 – 2010-09-07 (×3): 12.5 mg via INTRAVENOUS
  Filled 2010-09-06 (×3): qty 1

## 2010-09-06 MED ORDER — SODIUM CHLORIDE 0.9 % IV SOLN
999.0000 mL | Freq: Once | INTRAVENOUS | Status: AC
  Administered 2010-09-06: 1000 mL via INTRAVENOUS

## 2010-09-06 MED ORDER — HYDROMORPHONE HCL 1 MG/ML IJ SOLN
1.0000 mg | Freq: Once | INTRAMUSCULAR | Status: AC
  Administered 2010-09-06: 1 mg via INTRAVENOUS
  Filled 2010-09-06: qty 1

## 2010-09-06 MED ORDER — PROMETHAZINE HCL 12.5 MG PO TABS
25.0000 mg | ORAL_TABLET | Freq: Once | ORAL | Status: AC
  Administered 2010-09-06: 25 mg via ORAL
  Filled 2010-09-06: qty 2

## 2010-09-06 MED ORDER — POTASSIUM CHLORIDE IN NACL 20-0.9 MEQ/L-% IV SOLN
Freq: Once | INTRAVENOUS | Status: AC
  Administered 2010-09-06: 19:00:00 via INTRAVENOUS
  Filled 2010-09-06: qty 1000

## 2010-09-06 NOTE — ED Notes (Signed)
Pt c/o nausea, vomiting and diarrhea since last Wednesday. Pt also c/o low grade fever. Pt had chemotherapy treatment last Monday. Pt has been unable to eat or drink. Pt was given IV fluids 4 days last week and this past Monday at MD office. Nausea meds have not helped.

## 2010-09-06 NOTE — ED Notes (Signed)
Pt resting.  Reports nausea and pain still present but improved.  IV started.  IV patent.  Denies pain or problems with site.  Pt presently waiting for admitting provider.

## 2010-09-06 NOTE — ED Notes (Signed)
Patient resting in bed talking on cell phone. Airway patent. Respirations even and nonlabored. No acute distress noted. Patient's pain and nausea reevaluated. Patient reports improvement in pain. Rats pain at a 3 out of 10. Patient denies any relief in nausea-reports vomiting in x-ray. EDP made aware. No verbalized requests given.

## 2010-09-06 NOTE — ED Notes (Addendum)
Patient's nausea reevaluated-patient states "It's a little better but its still there."

## 2010-09-06 NOTE — ED Provider Notes (Signed)
History     Chief Complaint  Patient presents with  . GI Problem   HPI Comments: Pt reports she had her last round of 4 chemotherapy sessions s/p mastectomy 8 days ago, c/o persistent n/v/d and abd pain since. Pt was on fluids through her PICC line x 4 days last week, but line was taken out 4 days ago. Pt received more fluids in PCP's office 2 days ago because unable to tolerate food or fluids po. Reports 11 lb weight loss in 1 week. Pt has been taking phenergan with no relief. Reports abd pain as located in the upper abd and associated with episodes of diarrhea, describes this pain as similar to her past pancreatitis.    Patient is a 40 y.o. female presenting with GI illlness. The history is provided by the patient.  GI Problem  This is a recurrent problem. The current episode started more than 1 week ago. The problem has not changed since onset.Maximum temperature: "low grade" fever intermittently at home. Associated symptoms include abdominal pain and vomiting. Pertinent negatives include no headaches, no arthralgias and no cough. She has tried increased fluid intake (antiemetic phenergen) for the symptoms. The treatment provided mild relief. Past medical history comments: pancreatitis; chemotherapy for breast cancer.    Past Medical History  Diagnosis Date  . Cancer     LEFT SIDE BREAST  . Lupus   . BRCA2 negative 08/16/2010  . BRCA1 negative 08/16/2010  . BREAST CANCER 02/24/2010  . 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 B12 deficiency 08/21/2010  . Mitral valve replaced   . Raynaud disease     Past Surgical History  Procedure Date  . Mitral valve replacement   . Cholecystectomy   . Mastectomy     History reviewed. No pertinent family history.  History  Substance Use Topics  . Smoking status: Former Games developer  . Smokeless tobacco: Not on file  . Alcohol Use: No    OB History    Grav Para Term Preterm Abortions TAB SAB Ect Mult Living             Review of Systems  Constitutional: Positive for fever.  HENT: Negative for rhinorrhea.   Eyes: Negative for visual disturbance.  Respiratory: Negative for cough.   Cardiovascular: Negative for chest pain.  Gastrointestinal: Positive for nausea, vomiting, abdominal pain and diarrhea. Negative for blood in stool.  Genitourinary: Negative for flank pain.  Musculoskeletal: Negative for arthralgias.  Skin: Negative for rash.  Neurological: Negative for headaches.  All other systems reviewed and are negative.    Physical Exam  BP 118/83  Pulse 109  Temp(Src) 98.8 F (37.1 C) (Oral)  Resp 18  Ht 5\' 5"  (1.651 m)  Wt 155 lb (70.308 kg)  BMI 25.79 kg/m2  SpO2 100%  Physical Exam  Constitutional: She is oriented to person, place, and time. She appears well-developed and well-nourished.       tearful  HENT:  Head: Normocephalic and atraumatic.       Dry mucous membranes  Eyes: Conjunctivae and EOM are normal. Pupils are equal, round, and reactive to light. No scleral icterus.  Neck: Trachea normal. Neck supple. No thyromegaly present.  Cardiovascular: Regular rhythm, S1 normal, S2 normal and normal pulses.     No systolic murmur is present   No diastolic murmur is present  Pulses:      Radial pulses are 2+ on the right side, and 2+ on the left side.  tachycardic  Pulmonary/Chest: Effort normal and breath sounds normal. She has no wheezes. She has no rhonchi. She has no rales. She exhibits no tenderness.  Abdominal: Soft. Normal appearance and bowel sounds are normal. There is tenderness. There is no CVA tenderness and negative Murphy's sign.       Tenderness over epigastrium  Musculoskeletal: Normal range of motion. She exhibits no edema and no tenderness.       BLE:s Calves nontender, no cords or erythema, negative Homans sign  Neurological: She is alert and oriented to person, place, and time. She has normal strength. No cranial nerve deficit or sensory deficit.  GCS eye subscore is 4. GCS verbal subscore is 5. GCS motor subscore is 6.  Skin: Skin is warm and dry. No rash noted. She is not diaphoretic.  Psychiatric: Her speech is normal.       Cooperative and appropriate    ED Course  Procedures  Results for orders placed during the hospital encounter of 09/06/10  CBC      Component Value Range   WBC 7.3  4.0 - 10.5 (K/uL)   RBC 3.10 (*) 3.87 - 5.11 (MIL/uL)   Hemoglobin 9.8 (*) 12.0 - 15.0 (g/dL)   HCT 86.5 (*) 78.4 - 46.0 (%)   MCV 91.3  78.0 - 100.0 (fL)   MCH 31.6  26.0 - 34.0 (pg)   MCHC 34.6  30.0 - 36.0 (g/dL)   RDW 69.6  29.5 - 28.4 (%)   Platelets 102 (*) 150 - 400 (K/uL)  DIFFERENTIAL      Component Value Range   Neutrophils Relative 70  43 - 77 (%)   Lymphocytes Relative 21  12 - 46 (%)   Monocytes Relative 8  3 - 12 (%)   Eosinophils Relative 0  0 - 5 (%)   Basophils Relative 1  0 - 1 (%)   Neutro Abs 5.1  1.7 - 7.7 (K/uL)   Lymphs Abs 1.5  0.7 - 4.0 (K/uL)   Monocytes Absolute 0.6  0.1 - 1.0 (K/uL)   Eosinophils Absolute 0.0  0.0 - 0.7 (K/uL)   Basophils Absolute 0.1  0.0 - 0.1 (K/uL)   RBC Morphology POLYCHROMASIA PRESENT     WBC Morphology ATYPICAL LYMPHOCYTES     Smear Review LARGE PLATELETS PRESENT    COMPREHENSIVE METABOLIC PANEL      Component Value Range   Sodium 137  135 - 145 (mEq/L)   Potassium 3.0 (*) 3.5 - 5.1 (mEq/L)   Chloride 98  96 - 112 (mEq/L)   CO2 27  19 - 32 (mEq/L)   Glucose, Bld 92  70 - 99 (mg/dL)   BUN 11  6 - 23 (mg/dL)   Creatinine, Ser 1.32  0.50 - 1.10 (mg/dL)   Calcium 9.7  8.4 - 44.0 (mg/dL)   Total Protein 8.5 (*) 6.0 - 8.3 (g/dL)   Albumin 4.3  3.5 - 5.2 (g/dL)   AST 28  0 - 37 (U/L)   ALT 21  0 - 35 (U/L)   Alkaline Phosphatase 122 (*) 39 - 117 (U/L)   Total Bilirubin 0.4  0.3 - 1.2 (mg/dL)   GFR calc non Af Amer >60  >60 (mL/min)   GFR calc Af Amer >60  >60 (mL/min)  LIPASE, BLOOD      Component Value Range   Lipase 71 (*) 11 - 59 (U/L)  URINALYSIS, ROUTINE W REFLEX  MICROSCOPIC      Component Value Range   Color, Urine  YELLOW  YELLOW    Appearance CLEAR  CLEAR    Specific Gravity, Urine 1.015  1.005 - 1.030    pH 5.5  5.0 - 8.0    Glucose, UA NEGATIVE  NEGATIVE (mg/dL)   Hgb urine dipstick TRACE (*) NEGATIVE    Bilirubin Urine NEGATIVE  NEGATIVE    Ketones, ur NEGATIVE  NEGATIVE (mg/dL)   Protein, ur NEGATIVE  NEGATIVE (mg/dL)   Urobilinogen, UA 0.2  0.0 - 1.0 (mg/dL)   Nitrite NEGATIVE  NEGATIVE    Leukocytes, UA NEGATIVE  NEGATIVE   URINE MICROSCOPIC-ADD ON      Component Value Range   Squamous Epithelial / LPF RARE  RARE    WBC, UA 0-2  <3 (WBC/hpf)   RBC / HPF 0-2  <3 (RBC/hpf)   Bacteria, UA RARE  RARE    Dg Chest 2 View  09/06/2010  *RADIOLOGY REPORT*  Clinical Data: Epigastric pain, prior breast cancer  CHEST - 2 VIEW  Comparison: 08/04/2010  Findings: Previous right PICC line removed.  Epicardial pacing wires noted.  Normal heart size and vascularity.  Previous left mastectomy and cholecystectomy.  Lungs remain clear.  Negative for pneumonia, edema, effusion or pneumothorax.  Midline trachea.  IMPRESSION: Stable postoperative findings.  No acute chest process.  Original Report Authenticated By: Judie Petit. Ruel Favors, M.D.   MDM  N/v/d persistent despite home meds, hypokalemia treated and repeat antiemetics, serial ABD exams no peritonitis, d/w hospitalist who agrees to ED eval and admit.   I personally performed the services described in this documentation, which was scribed in my presence. The recorded information has been reviewed and considered.   Written by Enos Fling acting as scribe for Dr. Theodoro Kalata.   Sunnie Nielsen, MD 09/06/10 [phone]

## 2010-09-06 NOTE — H&P (Signed)
Job [id number]

## 2010-09-07 ENCOUNTER — Encounter (HOSPITAL_COMMUNITY): Payer: Self-pay

## 2010-09-07 DIAGNOSIS — E860 Dehydration: Secondary | ICD-10-CM | POA: Diagnosis present

## 2010-09-07 DIAGNOSIS — R112 Nausea with vomiting, unspecified: Secondary | ICD-10-CM | POA: Diagnosis present

## 2010-09-07 DIAGNOSIS — E876 Hypokalemia: Secondary | ICD-10-CM | POA: Diagnosis present

## 2010-09-07 LAB — COMPREHENSIVE METABOLIC PANEL
AST: 27 U/L (ref 0–37)
BUN: 9 mg/dL (ref 6–23)
CO2: 23 mEq/L (ref 19–32)
Calcium: 9 mg/dL (ref 8.4–10.5)
Chloride: 101 mEq/L (ref 96–112)
Creatinine, Ser: 0.62 mg/dL (ref 0.50–1.10)
GFR calc Af Amer: >60 mL/min (ref >60)
GFR calc non Af Amer: >60 mL/min (ref >60)
Glucose, Bld: 88 mg/dL (ref 70–99)
Total Bilirubin: 0.3 mg/dL (ref 0.3–1.2)

## 2010-09-07 LAB — BASIC METABOLIC PANEL
Calcium: 8.8 mg/dL (ref 8.4–10.5)
GFR calc Af Amer: >60 mL/min (ref >60)
GFR calc non Af Amer: >60 mL/min (ref >60)
Glucose, Bld: 76 mg/dL (ref 70–99)
Potassium: 3.5 mEq/L (ref 3.5–5.1)
Sodium: 138 mEq/L (ref 135–145)

## 2010-09-07 LAB — LIPASE, BLOOD: Lipase: 67 U/L — ABNORMAL HIGH (ref 11–59)

## 2010-09-07 LAB — PROTIME-INR: INR: 5.01 (ref 0.00–1.49)

## 2010-09-07 MED ORDER — ZOLPIDEM TARTRATE 5 MG PO TABS: 10.0000 mg | ORAL_TABLET | Freq: Every evening | ORAL | Status: DC | PRN

## 2010-09-07 MED ORDER — PROMETHAZINE HCL 12.5 MG PO TABS: 25.0000 mg | ORAL_TABLET | Freq: Four times a day (QID) | ORAL | Status: DC | PRN

## 2010-09-07 MED ORDER — SODIUM CHLORIDE 0.9 % IJ SOLN
INTRAMUSCULAR | Status: AC
  Administered 2010-09-07: 16:00:00
  Filled 2010-09-07: qty 10

## 2010-09-07 MED ORDER — GABAPENTIN 300 MG PO CAPS
600.0000 mg | ORAL_CAPSULE | Freq: Three times a day (TID) | ORAL | Status: DC
  Administered 2010-09-07 – 2010-09-11 (×15): 600 mg via ORAL
  Filled 2010-09-07 (×15): qty 2

## 2010-09-07 MED ORDER — POTASSIUM CHLORIDE CRYS ER 20 MEQ PO TBCR: 20.0000 meq | EXTENDED_RELEASE_TABLET | Freq: Two times a day (BID) | ORAL | Status: DC

## 2010-09-07 MED ORDER — LORAZEPAM 2 MG/ML IJ SOLN
1.0000 mg | INTRAMUSCULAR | Status: DC
  Administered 2010-09-07 – 2010-09-08 (×11): 1 mg via INTRAVENOUS
  Administered 2010-09-09: 17:00:00 via INTRAVENOUS
  Administered 2010-09-09 – 2010-09-12 (×18): 1 mg via INTRAVENOUS
  Administered 2010-09-12: 05:00:00 via INTRAVENOUS
  Filled 2010-09-07 (×33): qty 1

## 2010-09-07 MED ORDER — POTASSIUM CHLORIDE 10 MEQ/100ML IV SOLN
10.0000 meq | INTRAVENOUS | Status: AC
  Administered 2010-09-07: 10 meq via INTRAVENOUS
  Filled 2010-09-07: qty 100

## 2010-09-07 MED ORDER — SODIUM CHLORIDE 0.9 % IV SOLN
INTRAVENOUS | Status: DC
  Administered 2010-09-07 – 2010-09-11 (×5): via INTRAVENOUS

## 2010-09-07 MED ORDER — PANTOPRAZOLE SODIUM 40 MG IV SOLR
40.0000 mg | INTRAVENOUS | Status: DC
  Administered 2010-09-07 – 2010-09-08 (×2): 40 mg via INTRAVENOUS
  Filled 2010-09-07 (×2): qty 40

## 2010-09-07 MED ORDER — PROMETHAZINE HCL 12.5 MG PO TABS
25.0000 mg | ORAL_TABLET | Freq: Four times a day (QID) | ORAL | Status: DC | PRN
  Filled 2010-09-07: qty 2

## 2010-09-07 MED ORDER — MAGNESIUM OXIDE 400 MG PO TABS
400.0000 mg | ORAL_TABLET | Freq: Two times a day (BID) | ORAL | Status: AC
  Administered 2010-09-07 (×2): 400 mg via ORAL
  Filled 2010-09-07 (×2): qty 1

## 2010-09-07 MED ORDER — POTASSIUM CHLORIDE CRYS ER 20 MEQ PO TBCR
40.0000 meq | EXTENDED_RELEASE_TABLET | Freq: Once | ORAL | Status: AC
  Administered 2010-09-07: 40 meq via ORAL
  Filled 2010-09-07: qty 2

## 2010-09-07 MED ORDER — POTASSIUM CHLORIDE CRYS ER 20 MEQ PO TBCR
40.0000 meq | EXTENDED_RELEASE_TABLET | Freq: Every day | ORAL | Status: DC
  Administered 2010-09-07: 40 meq via ORAL
  Filled 2010-09-07: qty 2

## 2010-09-07 MED ORDER — HYDROMORPHONE HCL 1 MG/ML IJ SOLN
1.0000 mg | Freq: Four times a day (QID) | INTRAMUSCULAR | Status: DC | PRN
  Administered 2010-09-07 – 2010-09-10 (×7): 1 mg via INTRAVENOUS
  Administered 2010-09-11: 0.5 mg via INTRAVENOUS
  Administered 2010-09-11 – 2010-09-12 (×3): 1 mg via INTRAVENOUS
  Filled 2010-09-07 (×11): qty 1

## 2010-09-07 MED ORDER — HYDROMORPHONE HCL 2 MG PO TABS
2.0000 mg | ORAL_TABLET | ORAL | Status: DC | PRN
  Administered 2010-09-09: 2 mg via ORAL
  Filled 2010-09-07 (×2): qty 1

## 2010-09-07 MED ORDER — FOLIC ACID 1 MG PO TABS
1.0000 mg | ORAL_TABLET | Freq: Every day | ORAL | Status: DC
  Administered 2010-09-07 – 2010-09-11 (×5): 1 mg via ORAL
  Filled 2010-09-07 (×6): qty 1

## 2010-09-07 MED ORDER — PROMETHAZINE HCL 25 MG/ML IJ SOLN
12.5000 mg | Freq: Four times a day (QID) | INTRAMUSCULAR | Status: DC | PRN
Start: 2010-09-07 — End: 2010-09-12
  Administered 2010-09-07: 25 mg via INTRAVENOUS
  Administered 2010-09-08: 12.5 mg via INTRAVENOUS
  Administered 2010-09-08 – 2010-09-11 (×4): 25 mg via INTRAVENOUS
  Filled 2010-09-07 (×7): qty 1

## 2010-09-07 MED ORDER — LORAZEPAM 1 MG PO TABS
1.0000 mg | ORAL_TABLET | ORAL | Status: DC
  Filled 2010-09-07: qty 1

## 2010-09-07 MED ORDER — SODIUM CHLORIDE 0.9 % IJ SOLN
INTRAMUSCULAR | Status: AC
  Administered 2010-09-07: 12:00:00
  Filled 2010-09-07: qty 10

## 2010-09-07 MED ORDER — PROMETHAZINE HCL 25 MG/ML IJ SOLN: 12.5000 mg | Freq: Four times a day (QID) | INTRAMUSCULAR | Status: DC | PRN

## 2010-09-07 NOTE — H&P (Signed)
Subjective: [age]-year admitted for nausea and vomiting vomiting resolved minimally nauseated. She says she's hungry and will try to eat. She relates she is much Improved. Objective: Vital signs in last 24 hours: Filed Vitals:   09/06/10 2100 09/06/10 2200 09/07/10 0050 09/07/10 0600  BP: 118/66 117/78 123/82 123/71  Pulse: 96 94 88 82  Temp:   97.5 F (36.4 C) 97.4 F (36.3 C)  TempSrc:   Oral Oral  Resp:   18 18  Height:   5\' 4"  (1.626 m)   Weight:   71.5 kg (157 lb 10.1 oz)   SpO2: 94% 98% 100% 98%   Weight change:   General appearance: alert, cooperative and no distress Resp: clear to auscultation bilaterally Cardio: regular rate and rhythm, S1, S2 normal, no murmur, click, rub or gallop GI: soft, non-tender; bowel sounds normal; no masses,  no organomegaly  Lab Results:  Basename 09/07/10 0445 09/06/10 1620  NA 137 137  K 3.1* 3.0*  CL 101 98  CO2 23 27  GLUCOSE 88 92  BUN 9 11  CREATININE 0.62 0.72  CALCIUM 9.0 9.7    Basename 09/06/10 1620  WBC 7.3  HGB 9.8*  HCT 28.3*  PLT 102*    Studies/Results: Dg Chest 2 View  09/06/2010  *RADIOLOGY REPORT*  Clinical Data: Epigastric pain, prior breast cancer  CHEST - 2 VIEW   IMPRESSION: Stable postoperative findings.  No acute chest process.  Original Report Authenticated By: Judie Petit. Ruel Favors, M.D.    Medications: I have reviewed the patient's current medications.  Assessment/Plan: 1. Nausea and vomiting in adult, most likely secondary to her chemotherapy currently on IV fluids. She relates her her vomiting has resolved. She continues to have minimal nausea. She would like to go ahead and start eating. Use Phenergan 25 mg IV when necessary for vomiting. No diarrhea. We'll see these DC contact precautions DC Clostridium difficile he CR lat. 2. Hypokalemia with normal acid-base balance, 3.1. Replete IV secondarly to Nausea. One does of mag oxide. As magnesium was 1.5 an admission. Check basic metabolic panel in the  morning  3. Dehydration, resolved KVO IV fluids. 4. supratherapeutic INR for her mitral valve replacement St. Jude valve, hold Coumadin for today Coumadin per pharmacy. 5. Anemia of chronic disease. Hemoglobin currently stable.    LOS: 1 day   FELIZ ORTIZ, ABRAHAM 09/07/2010, 2:20 PM

## 2010-09-07 NOTE — H&P (Signed)
[name], INGRAM NO.:  [id number]  MEDICAL RECORD NO.:  [id number]  LOCATION:  A302                          FACILITY:  [facility]  PHYSICIAN:  Tarry Kos, MD       DATE OF BIRTH:  May 29, 1970  DATE OF ADMISSION:  09/06/2010 DATE OF DISCHARGE:  LH                             HISTORY & PHYSICAL   CHIEF COMPLAINT:  Nausea, vomiting.  HISTORY OF PRESENT ILLNESS:  [name] is a pleasant 40 year old female who has a history of left-sided breast cancer, stage III which was diagnosed in October 2003 who since then is completing a 6 around course of chemotherapy.  She just had her last around last Monday which is approximately 9 days ago and has been having a significant amount of nausea and vomiting and started with some diarrhea.  She has been having issues with her chemotherapy throughout her treatment, but this last time has been worse than normal.  She is quite dehydrated.  She denies any fevers.  She does have some epigastric abdominal pain.  She denies any blood in her vomit or in her stools.  She has not been able to eat much.  REVIEW OF SYSTEMS:  Otherwise negative.  PAST MEDICAL HISTORY: 1. Again stage III breast cancer left side diagnosed October 2011     status post mastectomy. 2. St. Jude Mitral valve replacement on chronic anticoagulation with     Coumadin. 3. Peripheral neuropathy. 4. Systemic lupus erythematosus. 5. Raynaud's. 6. Recent Port-A-Cath infection. 7. Rheumatic heart disease status post valve replacement with St. Jude     valve in March 2008. 8. History of C. diff colitis. 9. Status post cholecystectomy after suffering from septic shock from     acute cholecystitis, status post laparoscopic cholecystectomy. 10.History of pancytopenia secondary to chemotherapy. 11.Hypothyroidism. 12.GERD.  MEDICATIONS:  Please see epic list.  ALLERGIES:  None.  SOCIAL HISTORY:  She does not smoke, no alcohol, no IV drug abuse.  PHYSICAL  EXAMINATION:  VITALS:  Temperature is 98.8, pulse 92, respirations 18, blood pressure 125/79, 100% O2 sats on room air. GENERAL:  She is alert and oriented in no apparent distress.  Appears mildly clinically dehydrated. HEENT:  Extraocular movements are intact.  Pupils are equal and reactive to light.  Oropharynx clear.  Mucous membranes are dry. NECK:  No JVD.  No carotid bruits. COR:  Regular rate and rhythm without murmurs or gallops. CHEST:  Clear to auscultation bilaterally.  No wheeze, rhonchi, or rales. ABDOMEN:  Soft, nontender, nondistended.  Positive bowel sounds.  No hepatosplenomegaly. EXTREMITIES:  No clubbing, cyanosis, or edema. PSYCH:  Normal mood and affect. NEURO:  No focal neurological deficits. SKIN:  No rashes.  Sodium level is 137, potassium is low at 3.  LFTs are normal.  Lipase is slightly elevated at 71, alk phos is 122.  White count is 7.3, hemoglobin is 9.8.  Differential is normal.  Urinalysis is clean.  Chest x-ray is negative.  ASSESSMENT/PLAN:  This is a 40 year old female with nausea, vomiting, diarrhea. 1. Nausea, vomiting, and diarrhea likely secondary to side effects     from the chemotherapy, however, due to her history of Clostridium  difficile colitis in the past, I will check stool cultures and a     Clostridium difficile, provide her Phenergan as needed.  She says     the Zofran has not helped her in the past.  We will keep her n.p.o.     except meds and ice chips for now and advance her diet as     tolerates. 2. Hypokalemia.  We will replace this orally. 3. Dehydration.  Provide some IV fluids. 4. Chronic anticoagulation.  Continue her Coumadin per pharmacy. 5. Breast cancer.  She just completed her chemotherapy.  She will     still need close outpatient followup with Oncology.  She is a full     code.  Further conditions pending an overall hospital course.                                            ______________________________ Tarry Kos, MD     RD/MEDQ  D:  09/06/2010  T:  09/07/2010  Job:  [id number]

## 2010-09-07 NOTE — Progress Notes (Signed)
ANTICOAGULATION CONSULT NOTE - Initial  Warfarin  Indication: Mechanical Heart Valve   Patient Measurements: Height: 5\' 4"  (162.6 cm) Weight: 157 lb 10.1 oz (71.5 kg) IBW/kg (Calculated) : 54.7    Vital Signs: Temp: 97.4 F (36.3 C) (07/19 0600) Temp src: Oral (07/19 0600) BP: 123/71 mmHg (07/19 0600) Pulse Rate: 82  (07/19 0600)  Labs: Results for [name], [name] (MRN [id number]) as of 09/07/2010 08:45  09/07/2010 01:30  Prothrombin Time 47.2 (H)  INR 5.01 (HH)   Medical History: Past Medical History  Diagnosis Date  . Cancer     LEFT SIDE BREAST  . Lupus   . BRCA2 negative 08/16/2010  . BRCA1 negative 08/16/2010  . BREAST CANCER 02/24/2010  . 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 B12 deficiency 08/21/2010  . Mitral valve replaced   . Raynaud disease   . Fibromyalgia   . Anxiety     Medications:  Prescriptions prior to admission  Medication Sig Dispense Refill  . folic acid (FOLVITE) 1 MG tablet Take 1 mg by mouth daily.        . furosemide (LASIX) 40 MG tablet Take 40 mg by mouth daily.        Marland Kitchen gabapentin (NEURONTIN) 600 MG tablet Take 600 mg by mouth 3 (three) times daily.        Marland Kitchen HYDROmorphone (DILAUDID) 2 MG tablet Take 1 tablet (2 mg total) by mouth every 4 (four) hours as needed for pain.  40 tablet  0  . LORazepam (ATIVAN) 1 MG tablet Take 1 mg by mouth every 3 (three) hours.        . potassium chloride SA (K-DUR,KLOR-CON) 20 MEQ tablet Take 20 mEq by mouth daily.        . promethazine (PHENERGAN) 25 MG tablet Take 25 mg by mouth every 6 (six) hours as needed.        . warfarin (COUMADIN) 5 MG tablet Taking 15mg  daily except 10mg  on Mondays, Wednesdays and Fridays  90 tablet  3    Assessment: Okay for Protocol  Goal of Therapy:  INR 2.5 - 3.5   Plan:  No warfarin today. Daily PT/INR  Mady Gemma 09/07/2010,8:39 AM

## 2010-09-07 NOTE — Progress Notes (Signed)
CRITICAL VALUE ALERT  Critical value received:  PT 47.2; INR 5.01  Date of notification:  09/07/10  Time of notification:  03:30  Critical value read back:yes  Nurse who received alert:  Rhae Hammock, RN    MD notified (1st page):  Dr. Onalee Hua  Time of first page:  03:36  MD notified (2nd page):  Time of second page:  Responding MD:  Dr. Onalee Hua  Time MD responded:  04:05   Dr. Onalee Hua stated that there was nothing to do, that pharmacy was dosing her Coumadin.  No new orders were given.

## 2010-09-08 LAB — PROTIME-INR
INR: 3.82 — ABNORMAL HIGH (ref 0.00–1.49)
Prothrombin Time: 38.2 seconds — ABNORMAL HIGH (ref 11.6–15.2)

## 2010-09-08 MED ORDER — SODIUM CHLORIDE 0.9 % IJ SOLN
INTRAMUSCULAR | Status: AC
  Administered 2010-09-08: 10 mL
  Filled 2010-09-08: qty 10

## 2010-09-08 MED ORDER — PANTOPRAZOLE SODIUM 40 MG IV SOLR
40.0000 mg | Freq: Two times a day (BID) | INTRAVENOUS | Status: DC
  Administered 2010-09-08 – 2010-09-10 (×6): 40 mg via INTRAVENOUS
  Filled 2010-09-08 (×6): qty 40

## 2010-09-08 MED ORDER — WARFARIN SODIUM 2.5 MG PO TABS
2.5000 mg | ORAL_TABLET | Freq: Once | ORAL | Status: AC
  Administered 2010-09-08: 2.5 mg via ORAL
  Filled 2010-09-08: qty 1

## 2010-09-08 MED FILL — Promethazine HCl Inj 25 MG/ML: INTRAMUSCULAR | Qty: 1 | Status: AC

## 2010-09-08 MED FILL — Ondansetron HCl Inj 4 MG/2ML (2 MG/ML): INTRAMUSCULAR | Qty: 12 | Status: AC

## 2010-09-08 MED FILL — Docetaxel Soln for IV Infusion 160 MG/16ML: INTRAVENOUS | Qty: 10.5 | Status: AC

## 2010-09-08 MED FILL — Carboplatin IV Soln 600 MG/60ML: INTRAVENOUS | Qty: 56.2 | Status: AC

## 2010-09-08 NOTE — Progress Notes (Signed)
Subjective: 40 year old admitted for nausea and vomiting probably secondary to chemotherapy. Her vomiting has resolved he is having minimal nausea. Tolerating her clear liquid diet very well. Still requiring Zofran.  Objective: Vital signs in last 24 hours: Filed Vitals:   09/07/10 0600 09/07/10 1400 09/07/10 2200 09/08/10 0600  BP: 123/71 107/70 127/80 104/67  Pulse: 82 88 101 93  Temp: 97.4 F (36.3 C) 98.2 F (36.8 C) 98.5 F (36.9 C) 99.1 F (37.3 C)  TempSrc: Oral Oral Oral Oral  Resp: 18 20 18 16   Height:      Weight:      SpO2: 98% 99% 99% 96%    Intake/Output Summary (Last 24 hours) at 09/08/10 1044 Last data filed at 09/08/10 0650  Gross per 24 hour  Intake 1591.83 ml  Output      0 ml  Net 1591.83 ml   Weight change:   General appearance: alert, appears stated age and no distress Resp: clear to auscultation bilaterally Cardio: regular rate and rhythm, S1, S2 normal, no murmur, click, rub or gallop GI: normal findings: bowel sounds normal  Lab Results:  Basename 09/07/10 1511 09/07/10 0445  NA 138 137  K 3.5 3.1*  CL 105 101  CO2 25 23  GLUCOSE 76 88  BUN 6 9  CREATININE 0.49* 0.62  CALCIUM 8.8 9.0    Basename 09/06/10 1620  WBC 7.3  HGB 9.8*  HCT 28.3*  PLT 102*    Studies/Results: None   Medications:  Scheduled Meds:   . folic acid  1 mg Oral Daily  . gabapentin  600 mg Oral TID  . LORazepam  1 mg Intravenous Q3H  . magnesium oxide  400 mg Oral BID  . pantoprazole (PROTONIX) IV  40 mg Intravenous Q24H  . potassium chloride  10 mEq Intravenous Q1 Hr x 4  . potassium chloride  40 mEq Oral Once  . sodium chloride      . warfarin  2.5 mg Oral ONCE-1800  . DISCONTD: potassium chloride  20 mEq Oral BID  . DISCONTD: potassium chloride SA  40 mEq Oral Daily  . DISCONTD: sodium chloride       Continuous Infusions:   . sodium chloride 20 mL/hr at 09/08/10 0650   PRN Meds:.HYDROmorphone, HYDROmorphone, promethazine, promethazine,  zolpidem, DISCONTD: HYDROmorphone, DISCONTD: promethazine, DISCONTD: promethazine, DISCONTD: promethazine, DISCONTD: promethazine  Assessment/Plan: 1.Nausea and vomiting in adult, likely secondary to chemotherapy. Increase her protonix to twice a day. He showed only having nausea. Using Zofran when necessary. Able to tolerate her clear diet. We'll advance her diet as tolerated.   2. Hypokalemia with normal acid-base balance, resolved. Hypomagnesium resolved. 3. Mitral valve disorders, history of status St. Jude valve, continue Coumadin per pharmacy. 4. Dehydration resolved. 5 anemia of chronic disease hemoglobin continues to be stable.   LOS: 2 days   [name], ABRAHAM 09/08/2010, 10:44 AM

## 2010-09-08 NOTE — Progress Notes (Signed)
ANTICOAGULATION CONSULT NOTE   Warfarin  Indication: Mechanical Heart Valve   Patient Measurements: Height: 5\' 4"  (162.6 cm) Weight: 157 lb 10.1 oz (71.5 kg) IBW/kg (Calculated) : 54.7    Vital Signs: Temp: 99.1 F (37.3 C) (07/20 0600) Temp src: Oral (07/20 0600) BP: 104/67 mmHg (07/20 0600) Pulse Rate: 93  (07/20 0600)  Labs: INR Last Three Days: Recent Labs  Basename 09/08/10 0450 09/07/10 0130   INR 3.82* 5.01*              Medical History: Past Medical History  Diagnosis Date  . Cancer     LEFT SIDE BREAST  . Lupus   . BRCA2 negative 08/16/2010  . BRCA1 negative 08/16/2010  . BREAST CANCER 02/24/2010  . 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 B12 deficiency 08/21/2010  . Mitral valve replaced   . Raynaud disease   . Fibromyalgia   . Anxiety     Medications:  Prescriptions prior to admission  Medication Sig Dispense Refill  . cyclobenzaprine (FLEXERIL) 10 MG tablet Take 10 mg by mouth 2 (two) times daily as needed. FOR SPASMS OR PAIN      . folic acid (FOLVITE) 1 MG tablet Take 1 mg by mouth daily.        . furosemide (LASIX) 40 MG tablet Take 40 mg by mouth daily.        Marland Kitchen gabapentin (NEURONTIN) 600 MG tablet Take 600 mg by mouth 3 (three) times daily.        Marland Kitchen HYDROmorphone (DILAUDID) 2 MG tablet Take 1 tablet (2 mg total) by mouth every 4 (four) hours as needed for pain.  40 tablet  0  . hydroxychloroquine (PLAQUENIL) 200 MG tablet Take 200 mg by mouth 2 (two) times daily.        Marland Kitchen LORazepam (ATIVAN) 1 MG tablet Take 1 mg by mouth every 3 (three) hours.        . potassium chloride SA (K-DUR,KLOR-CON) 20 MEQ tablet Take 20 mEq by mouth daily.        . promethazine (PHENERGAN) 25 MG tablet Take 25 mg by mouth every 6 (six) hours as needed.       . warfarin (COUMADIN) 5 MG tablet Taking 15mg  daily except 10mg  on Mondays, Wednesdays and Fridays  90 tablet  3    Assessment: INR slightly above goal (2.5-3.5) & trending down.  Pt takes large dose at home.  Will give 2.5mg  today to buffer drop.  Goal of Therapy:  INR 2.5 - 3.5   Plan:  Coumadin 2.5mg  today. Daily PT/INR  Margo Aye, Kenyon Eshleman A 09/08/2010,9:18 AM

## 2010-09-09 DIAGNOSIS — K121 Other forms of stomatitis: Secondary | ICD-10-CM | POA: Diagnosis present

## 2010-09-09 LAB — HEPARIN LEVEL (UNFRACTIONATED): Heparin Unfractionated: 0.43 IU/mL (ref 0.30–0.70)

## 2010-09-09 LAB — PROTIME-INR
INR: 1.71 — ABNORMAL HIGH (ref 0.00–1.49)
Prothrombin Time: 20.4 seconds — ABNORMAL HIGH (ref 11.6–15.2)

## 2010-09-09 MED ORDER — HEPARIN BOLUS VIA INFUSION
2000.0000 [IU] | Freq: Once | INTRAVENOUS | Status: AC
  Administered 2010-09-09: 2000 [IU] via INTRAVENOUS
  Filled 2010-09-09: qty 2000

## 2010-09-09 MED ORDER — SODIUM CHLORIDE 0.9 % IJ SOLN
INTRAMUSCULAR | Status: AC
  Administered 2010-09-09: 09:00:00
  Filled 2010-09-09: qty 10

## 2010-09-09 MED ORDER — HYDROCODONE-ACETAMINOPHEN 5-500 MG PO TABS: 1.0000 | ORAL_TABLET | Freq: Four times a day (QID) | ORAL | Status: DC | PRN

## 2010-09-09 MED ORDER — HEPARIN (PORCINE) IN NACL 100-0.45 UNIT/ML-% IJ SOLN
18.0000 [IU]/kg/h | INTRAMUSCULAR | Status: DC
  Administered 2010-09-09 – 2010-09-10 (×2): 18 [IU]/kg/h via INTRAVENOUS
  Filled 2010-09-09 (×2): qty 250

## 2010-09-09 MED ORDER — SODIUM CHLORIDE 0.9 % IJ SOLN
INTRAMUSCULAR | Status: AC
  Administered 2010-09-09: 22:00:00
  Filled 2010-09-09: qty 10

## 2010-09-09 MED ORDER — WARFARIN SODIUM 7.5 MG PO TABS
15.0000 mg | ORAL_TABLET | Freq: Once | ORAL | Status: AC
  Administered 2010-09-09: 15 mg via ORAL
  Filled 2010-09-09: qty 2

## 2010-09-09 NOTE — Progress Notes (Signed)
Pharmacist-Scott Hall paged with results of Heparin level. No new orders at this time.

## 2010-09-09 NOTE — Progress Notes (Signed)
ANTICOAGULATION CONSULT NOTE   Warfarin & IV Heparin  Indication: Mechanical Heart Valve   Patient Measurements: Height: 5\' 4"  (162.6 cm) Weight: 157 lb 10.1 oz (71.5 kg) IBW/kg (Calculated) : 54.7    Vital Signs: Temp: 98 F (36.7 C) (07/21 0614) BP: 94/64 mmHg (07/21 0614) Pulse Rate: 81  (07/21 0614)  Labs: INR Last Three Days: Recent Labs  Basename 09/09/10 0945 09/08/10 0450 09/07/10 0130   INR 1.71* 3.82* 5.01*              Medical History: Past Medical History  Diagnosis Date  . Cancer     LEFT SIDE BREAST  . Lupus   . BRCA2 negative 08/16/2010  . BRCA1 negative 08/16/2010  . BREAST CANCER 02/24/2010  . 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 B12 deficiency 08/21/2010  . Mitral valve replaced   . Raynaud disease   . Fibromyalgia   . Anxiety     Medications:  Prescriptions prior to admission  Medication Sig Dispense Refill  . cyclobenzaprine (FLEXERIL) 10 MG tablet Take 10 mg by mouth 2 (two) times daily as needed. FOR SPASMS OR PAIN      . folic acid (FOLVITE) 1 MG tablet Take 1 mg by mouth daily.        . furosemide (LASIX) 40 MG tablet Take 40 mg by mouth daily.        Marland Kitchen gabapentin (NEURONTIN) 600 MG tablet Take 600 mg by mouth 3 (three) times daily.        Marland Kitchen HYDROmorphone (DILAUDID) 2 MG tablet Take 1 tablet (2 mg total) by mouth every 4 (four) hours as needed for pain.  40 tablet  0  . hydroxychloroquine (PLAQUENIL) 200 MG tablet Take 200 mg by mouth 2 (two) times daily.        Marland Kitchen LORazepam (ATIVAN) 1 MG tablet Take 1 mg by mouth every 3 (three) hours.        . potassium chloride SA (K-DUR,KLOR-CON) 20 MEQ tablet Take 20 mEq by mouth daily.        . promethazine (PHENERGAN) 25 MG tablet Take 25 mg by mouth every 6 (six) hours as needed.       . warfarin (COUMADIN) 5 MG tablet Taking 15mg  daily except 10mg  on Mondays, Wednesdays and Fridays  90 tablet  3    Assessment: INR below goal today. Subtherapeutic. Adding IV  heparin until INR therapeutic again.  Goal of Therapy:  INR 2.5 - 3.5 Heparin level 0.3-0.7   Plan:  Coumadin 15mg  today. Heparin 2000 units bolus then 1300 units/hr. Check heparin level in 6 hrs then daily Daily PT/INR  Margo Aye, Emberlie Gotcher A 09/09/2010,10:46 AM

## 2010-09-09 NOTE — Progress Notes (Signed)
Subjective: 40 year old admitted for nausea and vomiting probably secondary to chemotherapy. Her nausea and vomiting. Objective: Vital signs in last 24 hours: Filed Vitals:   09/08/10 1500 09/08/10 2138 09/09/10 0614 09/09/10 0700  BP: 125/73 120/78 94/64   Pulse: 97 90 81   Temp: 98.1 F (36.7 C) 97.3 F (36.3 C) 98 F (36.7 C)   TempSrc:  Oral    Resp: 16 20 16    Height:    5\' 4"  (1.626 m)  Weight:      SpO2: 98% 100% 99%     Intake/Output Summary (Last 24 hours) at 09/09/10 1032 Last data filed at 09/08/10 1800  Gross per 24 hour  Intake    480 ml  Output    450 ml  Net     30 ml   Weight change:   General appearance: alert, appears stated age and no distress Resp: clear to auscultation bilaterally Cardio: regular rate and rhythm, S1, S2 normal, no murmur, click, rub or gallop GI: normal findings: bowel sounds normal  Lab Results:  Basename 09/07/10 1511 09/07/10 0445  NA 138 137  K 3.5 3.1*  CL 105 101  CO2 25 23  GLUCOSE 76 88  BUN 6 9  CREATININE 0.49* 0.62  CALCIUM 8.8 9.0    Basename 09/06/10 1620  WBC 7.3  HGB 9.8*  HCT 28.3*  PLT 102*   INR 1.7 Studies/Results: None   Medications:  Scheduled Meds:    . folic acid  1 mg Oral Daily  . gabapentin  600 mg Oral TID  . LORazepam  1 mg Intravenous Q3H  . pantoprazole (PROTONIX) IV  40 mg Intravenous Q12H  . sodium chloride      . sodium chloride      . sodium chloride      . warfarin  2.5 mg Oral ONCE-1800  . DISCONTD: pantoprazole (PROTONIX) IV  40 mg Intravenous Q24H   Continuous Infusions:    . sodium chloride 20 mL/hr at 09/08/10 0650   PRN Meds:.HYDROmorphone, HYDROmorphone, promethazine, promethazine, zolpidem  Assessment/Plan: 1.Nausea and vomiting in adult, likely secondary to chemotherapy. Change her Protonix to by mouth twice a day. And vomiting resolved. Continue to the Phenergan when necessary. We'll advance her diet as tolerated.  2. Hypokalemia with normal acid-base  balance, resolved. Hypomagnesium resolved. 3. Mitral valve disorders, history of status St. Jude valve, continue Coumadin per pharmacy, INR 1.7. Start heparin IV. Recheck INR in the morning.. 4. Dehydration resolved. 5 anemia of chronic disease hemoglobin continues to be stable.   LOS: 3 days   FELIZ ORTIZ, ABRAHAM 09/09/2010, 10:32 AM

## 2010-09-10 LAB — CBC
HCT: 25.8 % — ABNORMAL LOW (ref 36.0–46.0)
MCH: 32.2 pg (ref 26.0–34.0)
MCHC: 32.9 g/dL (ref 30.0–36.0)
MCV: 97.7 fL (ref 78.0–100.0)
RDW: 17.5 % — ABNORMAL HIGH (ref 11.5–15.5)

## 2010-09-10 LAB — HEPARIN LEVEL (UNFRACTIONATED): Heparin Unfractionated: 0.27 IU/mL — ABNORMAL LOW (ref 0.30–0.70)

## 2010-09-10 MED ORDER — SODIUM CHLORIDE 0.9 % IJ SOLN
INTRAMUSCULAR | Status: AC
  Administered 2010-09-10: 10 mL
  Filled 2010-09-10: qty 10

## 2010-09-10 MED ORDER — SODIUM CHLORIDE 0.9 % IJ SOLN
INTRAMUSCULAR | Status: AC
  Administered 2010-09-10: 20:00:00
  Filled 2010-09-10: qty 10

## 2010-09-10 MED ORDER — HEPARIN (PORCINE) IN NACL 100-0.45 UNIT/ML-% IJ SOLN
1500.0000 [IU]/h | INTRAMUSCULAR | Status: DC
  Administered 2010-09-11 (×2): 1500 [IU]/h via INTRAVENOUS
  Filled 2010-09-10 (×2): qty 250

## 2010-09-10 MED ORDER — WARFARIN SODIUM 10 MG PO TABS
20.0000 mg | ORAL_TABLET | Freq: Once | ORAL | Status: AC
  Administered 2010-09-10: 20 mg via ORAL
  Filled 2010-09-10: qty 2

## 2010-09-10 NOTE — Progress Notes (Signed)
ANTICOAGULATION CONSULT NOTE   Warfarin & IV Heparin  Indication: Mechanical Heart Valve   Patient Measurements: Height: 5\' 4"  (162.6 cm) Weight: 157 lb 10.1 oz (71.5 kg) IBW/kg (Calculated) : 54.7    Vital Signs: Temp: 97.8 F (36.6 C) (07/22 0446) BP: 98/56 mmHg (07/22 0510) Pulse Rate: 83  (07/22 0446)  Labs: INR Last Three Days: Recent Labs  Basename 09/10/10 0548 09/09/10 0945 09/08/10 0450   INR 1.48 1.71* 3.82*              Medical History: Past Medical History  Diagnosis Date  . Cancer     LEFT SIDE BREAST  . Lupus   . BRCA2 negative 08/16/2010  . BRCA1 negative 08/16/2010  . BREAST CANCER 02/24/2010  . 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 B12 deficiency 08/21/2010  . Mitral valve replaced   . Raynaud disease   . Fibromyalgia   . Anxiety     Medications:  Prescriptions prior to admission  Medication Sig Dispense Refill  . cyclobenzaprine (FLEXERIL) 10 MG tablet Take 10 mg by mouth 2 (two) times daily as needed. FOR SPASMS OR PAIN      . folic acid (FOLVITE) 1 MG tablet Take 1 mg by mouth daily.        . furosemide (LASIX) 40 MG tablet Take 40 mg by mouth daily.        Marland Kitchen gabapentin (NEURONTIN) 600 MG tablet Take 600 mg by mouth 3 (three) times daily.        Marland Kitchen HYDROmorphone (DILAUDID) 2 MG tablet Take 1 tablet (2 mg total) by mouth every 4 (four) hours as needed for pain.  40 tablet  0  . hydroxychloroquine (PLAQUENIL) 200 MG tablet Take 200 mg by mouth 2 (two) times daily.        Marland Kitchen LORazepam (ATIVAN) 1 MG tablet Take 1 mg by mouth every 3 (three) hours.        . potassium chloride SA (K-DUR,KLOR-CON) 20 MEQ tablet Take 20 mEq by mouth daily.        . promethazine (PHENERGAN) 25 MG tablet Take 25 mg by mouth every 6 (six) hours as needed.       . warfarin (COUMADIN) 5 MG tablet Taking 15mg  daily except 10mg  on Mondays, Wednesdays and Fridays  90 tablet  3    Assessment: INR below goal today. Subtherapeutic. Heparin  level 0.4 down to 0.27 (below goal). Goal heparin level is 0.3-0.7.  Goal of Therapy:  INR 2.5 - 3.5 Heparin level 0.3-0.7   Plan:  Coumadin 20mg  today. Increase Heparin to 1500 units/hr and recheck level in 6 hrs.  Daily PT/INR and heparin level.  Valrie Hart A 09/10/2010,8:06 AM

## 2010-09-10 NOTE — Progress Notes (Signed)
Subjective: [name], rates her abdominal pain is much improved today. She's not having any nausea. She continues to tolerate her diet well. No bloody bowel movements. Objective: Vital signs in last 24 hours: Filed Vitals:   09/09/10 1500 09/09/10 2143 09/10/10 0446 09/10/10 0510  BP: 107/70 112/74 87/60 98/56   Pulse: 94 97 83   Temp: 98.1 F (36.7 C) 97.9 F (36.6 C) 97.8 F (36.6 C)   TempSrc:      Resp: 16 16 20    Height:      Weight:      SpO2: 98% 93% 99%     Intake/Output Summary (Last 24 hours) at 09/10/10 0818 Last data filed at 09/09/10 2100  Gross per 24 hour  Intake 1134.88 ml  Output   1300 ml  Net -165.12 ml   Weight change:   General appearance: alert, cooperative and no distress  Lab Results:  Basename 09/10/10 0548  WBC 4.2  HGB 8.5*  HCT 25.8*  PLT 83*    Studies/Results:   Medications:  Scheduled Meds:   . folic acid  1 mg Oral Daily  . gabapentin  600 mg Oral TID  . heparin  2,000 Units Intravenous Once  . LORazepam  1 mg Intravenous Q3H  . pantoprazole (PROTONIX) IV  40 mg Intravenous Q12H  . sodium chloride      . sodium chloride      . sodium chloride      . warfarin  15 mg Oral ONCE-1800  . warfarin  20 mg Oral ONCE-1800   Continuous Infusions:   . sodium chloride 20 mL/hr at 09/09/10 2220  . heparin    . DISCONTD: heparin 18 Units/kg/hr (09/10/10 0725)   PRN Meds:.HYDROmorphone, HYDROmorphone, promethazine, promethazine, zolpidem  Assessment/Plan: 1. nausea and vomiting resolved. Change Protonix to by mouth. 2. Hypokalemia/hypomagnesemia resolved. 3 mitral valve disorder with a history of St. Jude valve. Currently on heparin IV and Coumadin INR continues to go down. Increase the dose of Coumadin check INR in the morning. 4 anemia of chronic disease (secondary to chemotherapy). Hemoglobin stable.   LOS: 4 days   FELIZ ORTIZ, Elohim Brune 09/10/2010, 8:18 AM

## 2010-09-11 LAB — HEPARIN LEVEL (UNFRACTIONATED): Heparin Unfractionated: 0.66 IU/mL (ref 0.30–0.70)

## 2010-09-11 LAB — CBC
MCH: 31.7 pg (ref 26.0–34.0)
MCHC: 32.4 g/dL (ref 30.0–36.0)
Platelets: 91 10*3/uL — ABNORMAL LOW (ref 150–400)

## 2010-09-11 LAB — PROTIME-INR
INR: 1.78 — ABNORMAL HIGH (ref 0.00–1.49)
Prothrombin Time: 21 seconds — ABNORMAL HIGH (ref 11.6–15.2)

## 2010-09-11 MED ORDER — SODIUM CHLORIDE 0.9 % IJ SOLN
INTRAMUSCULAR | Status: AC
  Administered 2010-09-11: 6 mL
  Filled 2010-09-11: qty 6

## 2010-09-11 MED ORDER — SODIUM CHLORIDE 0.9 % IJ SOLN
INTRAMUSCULAR | Status: AC
  Administered 2010-09-11: 10 mL
  Filled 2010-09-11: qty 10

## 2010-09-11 MED ORDER — PANTOPRAZOLE SODIUM 40 MG PO TBEC
40.0000 mg | DELAYED_RELEASE_TABLET | Freq: Two times a day (BID) | ORAL | Status: DC
  Administered 2010-09-11 – 2010-09-12 (×3): 40 mg via ORAL
  Filled 2010-09-11 (×4): qty 1

## 2010-09-11 MED ORDER — WARFARIN SODIUM 10 MG PO TABS
20.0000 mg | ORAL_TABLET | Freq: Once | ORAL | Status: AC
  Administered 2010-09-11: 20 mg via ORAL
  Filled 2010-09-11: qty 2

## 2010-09-11 MED ORDER — PROMETHAZINE HCL 25 MG/ML IJ SOLN
INTRAMUSCULAR | Status: AC
  Filled 2010-09-11: qty 1

## 2010-09-11 MED ORDER — SODIUM CHLORIDE 0.9 % IJ SOLN
INTRAMUSCULAR | Status: AC
  Administered 2010-09-11: 3 mL via INTRAVENOUS
  Filled 2010-09-11: qty 3

## 2010-09-11 NOTE — Progress Notes (Signed)
Subjective: [name], no complains. Objective: Vital signs in last 24 hours: Filed Vitals:   09/10/10 0510 09/10/10 1500 09/10/10 2117 09/11/10 0419  BP: 98/56 89/62 100/70 91/59  Pulse:  83 92 102  Temp:  98.3 F (36.8 C) 98.2 F (36.8 C) 98.5 F (36.9 C)  TempSrc:      Resp:  16 16 20   Height:      Weight:      SpO2:  100% 97% 99%    Intake/Output Summary (Last 24 hours) at 09/11/10 1142 Last data filed at 09/11/10 0800  Gross per 24 hour  Intake   1989 ml  Output    900 ml  Net   1089 ml   Weight change:   General appearance: alert, cooperative and no distress Cardiovascular-wise regular rate and rhythm with positive S1-S2. No gallops Lungs good air movement clear to auscultation. Abdomen positive bowel sounds nontender nondistended.  Lab Results:  Basename 09/11/10 0509 09/10/10 0548  WBC 5.1 4.2  HGB 8.3* 8.5*  HCT 25.6* 25.8*  PLT 91* 83*    Studies/Results: None  Medications:  Scheduled Meds:    . folic acid  1 mg Oral Daily  . gabapentin  600 mg Oral TID  . LORazepam  1 mg Intravenous Q3H  . pantoprazole  40 mg Oral BID AC  . sodium chloride      . sodium chloride      . sodium chloride      . warfarin  20 mg Oral ONCE-1800  . warfarin  20 mg Oral ONCE-1800  .  Protonix 40 mg oral daily    Q12H   Continuous Infusions:    . sodium chloride 20 mL/hr at 09/09/10 2220  . heparin 1,500 Units/hr (09/11/10 0134)   PRN Meds:.HYDROmorphone, HYDROmorphone, promethazine, promethazine, zolpidem  Assessment/Plan: 1. nausea and vomiting resolved. Change Protonix to by mouth. 2. Hypokalemia/hypomagnesemia resolved. 3 mitral valve disorder with a history of St. Jude valve. Currently on heparin IV and Coumadin INR increasing today. We'll continue to monitor per pharmacy the. 4 anemia of chronic disease (secondary to chemotherapy). Hemoglobin stable.   LOS: 5 days   FELIZ ORTIZ, ABRAHAM 09/11/2010, 11:42 AM

## 2010-09-11 NOTE — Progress Notes (Signed)
ANTICOAGULATION CONSULT NOTE   Warfarin & IV Heparin  Indication: Mechanical Heart Valve   Patient Measurements: Height: 5\' 4"  (162.6 cm) Weight: 157 lb 10.1 oz (71.5 kg) IBW/kg (Calculated) : 54.7    Vital Signs: Temp: 98.5 F (36.9 C) (07/23 0419) BP: 91/59 mmHg (07/23 0419) Pulse Rate: 102  (07/23 0419)  Labs: INR Last Three Days: Recent Labs  Basename 09/11/10 0509 09/10/10 0548 09/09/10 0945   INR 1.78* 1.48 1.71*              Medical History: Past Medical History  Diagnosis Date  . Cancer     LEFT SIDE BREAST  . Lupus   . BRCA2 negative 08/16/2010  . BRCA1 negative 08/16/2010  . BREAST CANCER 02/24/2010  . 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 B12 deficiency 08/21/2010  . Mitral valve replaced   . Raynaud disease   . Fibromyalgia   . Anxiety     Medications:  Prescriptions prior to admission  Medication Sig Dispense Refill  . cyclobenzaprine (FLEXERIL) 10 MG tablet Take 10 mg by mouth 2 (two) times daily as needed. FOR SPASMS OR PAIN      . folic acid (FOLVITE) 1 MG tablet Take 1 mg by mouth daily.        . furosemide (LASIX) 40 MG tablet Take 40 mg by mouth daily.        Marland Kitchen gabapentin (NEURONTIN) 600 MG tablet Take 600 mg by mouth 3 (three) times daily.        Marland Kitchen HYDROmorphone (DILAUDID) 2 MG tablet Take 1 tablet (2 mg total) by mouth every 4 (four) hours as needed for pain.  40 tablet  0  . hydroxychloroquine (PLAQUENIL) 200 MG tablet Take 200 mg by mouth 2 (two) times daily.        Marland Kitchen LORazepam (ATIVAN) 1 MG tablet Take 1 mg by mouth every 3 (three) hours.        . potassium chloride SA (K-DUR,KLOR-CON) 20 MEQ tablet Take 20 mEq by mouth daily.        . promethazine (PHENERGAN) 25 MG tablet Take 25 mg by mouth every 6 (six) hours as needed.       . warfarin (COUMADIN) 5 MG tablet Taking 15mg  daily except 10mg  on Mondays, Wednesdays and Fridays  90 tablet  3    Assessment: INR below goal today. Subtherapeutic. Heparin  level therapeutic.  Goal of Therapy:  INR 2.5 - 3.5 Heparin level 0.3-0.7   Plan:  Coumadin 20mg  today. Continue heparin at current rate.  Daily PT/INR and heparin level.  Valrie Hart A 09/11/2010,7:42 AM

## 2010-09-12 LAB — PROTIME-INR
INR: 2.85 — ABNORMAL HIGH (ref 0.00–1.49)
Prothrombin Time: 30.4 seconds — ABNORMAL HIGH (ref 11.6–15.2)

## 2010-09-12 MED ORDER — SODIUM CHLORIDE 0.9 % IJ SOLN
INTRAMUSCULAR | Status: AC
  Filled 2010-09-12: qty 10

## 2010-09-12 MED ORDER — PROMETHAZINE HCL 50 MG RE SUPP: 50.0000 mg | Freq: Four times a day (QID) | RECTAL | Status: DC | PRN

## 2010-09-12 MED ORDER — HYDROCODONE-ACETAMINOPHEN 5-500 MG PO TABS: 1.0000 | ORAL_TABLET | Freq: Four times a day (QID) | ORAL | Status: DC | PRN

## 2010-09-12 MED ORDER — PANTOPRAZOLE SODIUM 40 MG PO TBEC: 40.0000 mg | DELAYED_RELEASE_TABLET | Freq: Two times a day (BID) | ORAL | Status: DC

## 2010-09-12 NOTE — Discharge Summary (Signed)
[name] MRN: [id number] DOB/AGE: 42-05-1970 42 y.o.  Admit date: 09/06/2010 Discharge date: 09/12/2010  Primary Care Physician:  Colette Ribas, MD   Discharge Diagnoses:    Nausea and vomiting probably secondary to chemotherapy. Resolved    . Mitral valve disorders  . SYSTEMIC LUPUS ERYTHEMATOSUS  . BREAST CANCER  . FEVER PRESENTING CONDITIONS CLASSIFIED ELSEWHERE  . CHOLECYSTECTOMY, LAPAROSCOPIC, HX OF  . Heart valve replaced by other means  . Encounter for long-term (current) use of anticoagulants  . BRCA1 negative  . BRCA2 negative  . B12 deficiency  . 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 Qty: 15 tablet, Refills: 0      CONTINUE these medications which have NOT CHANGED   Details  cyclobenzaprine (FLEXERIL) 10 MG tablet Take 10 mg by mouth 2 (two) times daily as needed. FOR SPASMS OR PAIN    folic acid (FOLVITE) 1 MG tablet Take 1 mg by mouth daily.      furosemide (LASIX) 40 MG tablet Take 40 mg by mouth daily.      gabapentin (NEURONTIN) 600 MG tablet Take 600 mg by mouth 3 (three) times daily.      hydroxychloroquine (PLAQUENIL) 200 MG tablet Take 200 mg by mouth 2 (two) times daily.      LORazepam (ATIVAN) 1 MG tablet Take 1 mg by mouth every 3 (three) hours.      potassium chloride SA (K-DUR,KLOR-CON) 20 MEQ tablet Take 20 mEq by mouth daily.      promethazine (PHENERGAN) 25 MG tablet Take 25 mg by mouth every 6 (six) hours as needed.     warfarin (COUMADIN) 5 MG tablet Taking 15mg  daily except 10mg  on Mondays, Wednesdays and Fridays Qty: 90 tablet, Refills: 3      STOP taking these medications     HYDROmorphone (DILAUDID) 2 MG tablet            Consults:   none   SIGNIFICANT DIAGNOSTIC  STUDIES:  Dg Chest 2 View  09/06/2010  *RADIOLOGY REPORT*  Clinical Data: Epigastric pain, prior breast cancer  CHEST - 2 VIEW  Comparison:  IMPRESSION: Stable postoperative findings.  No acute chest process.  Original Report Authenticated By: Judie Petit. Ruel Favors, M.D.     BRIEF ADMITTING H & P: [name] is a pleasant 40 year old female who has a history of left-sided breast cancer, stage III which was  diagnosed in October 2003 who since then is completing a 6 around course of chemotherapy. She just had her last around last Monday which is approximately 9 days ago and has been having a significant amount of nausea and vomiting and started with some diarrhea. She has been having issues with her chemotherapy throughout her treatment, but this last  time has been worse than normal. She is quite dehydrated. She denies any fevers. She does have some epigastric abdominal pain. She denies any blood in her vomit or in her stools. She has not been able to eat Much.   PHYSICAL EXAMINATION: VITALS: Temperature is 98.8, pulse 92,  respirations 18, blood pressure 125/79, 100% O2 sats on room air.  GENERAL: She is alert and oriented in no apparent distress. Appears  mildly clinically dehydrated.  HEENT: Extraocular movements are intact. Pupils are equal and reactive  to  light. Oropharynx clear. Mucous membranes are dry.  NECK: No JVD. No carotid bruits.  COR: Regular rate and rhythm without murmurs or gallops.  CHEST: Clear to auscultation bilaterally. No wheeze, rhonchi, or  rales.  ABDOMEN: Soft, nontender, nondistended. Positive bowel sounds. No  hepatosplenomegaly.  EXTREMITIES: No clubbing, cyanosis, or edema.  PSYCH: Normal mood and affect.  NEURO: No focal neurological deficits.  SKIN: No rashes.  Labs on admission: Sodium level is 137, potassium is low at 3. LFTs are normal. Lipase is  slightly elevated at 71, alk phos is 122. White count is 7.3,  hemoglobin is 9.8. Differential is normal.  Urinalysis is clean. Chest  x-ray is negative.  Hospital Course:  1. Nausea and vomiting in adult, nausea vomiting mostly related to her chemotherapy. This was her last round of chemotherapy. She was admitted to the floor was put n.p.o. start IV fluids, Phenergan 25 mg controlled her nausea. She continued to improve by the third day we advance her diet and she tolerated this well.since the third day she has been doing quite well not requiring Phenergan. Tolerating her diet she was discharged in stable condition.   Hypokalemia with normal acid-base balance, most likely secondary to her nausea and vomiting. A Mag. level was checked was also low this was repleted and her potassium was repleted.  Dehydration, mostly secondary to #1. Her orthostatics were checked and were positive, by history. Thisresolved.  Mitral valve disorders, history of [employer]. Jude valve replacement. On admission her INR was high her Coumadin was held because of her nausea and vomiting and because of her INR being high. Her INR started to trend down. On the third day her Coumadin was resumed. Even after resuming her Coumadin her INR continued to go down. It was below 2.5. So we had to start on IV heparin until INR became therapeutic. On the day of discharge her on Arna Medici is a 2.8 .  Disposition and Follow-up: Discharge Orders    Future Appointments: Provider: Department: Dept Phone: Center:   09/13/2010 11:50 AM Louanna Raw, RN [facility] [phone] [facility]   09/18/2010 10:50 AM [facility]ing [facility] [phone] None   09/22/2010 1:30 PM [facility] Chair [facility] [phone] None     Future Orders Please Complete By Expires   Diet general      Diet - low sodium heart healthy      Increase activity slowly      Discharge instructions      Comments:   Keep advancing diet as tolerated. Avoid for the next few days to moderate. As it can make her nausea worse. She did take Phenergan as  needed. Resume activity as tolerated   Call MD for:      Scheduling Instructions:   -If nausea and vomiting worsens come back to the emergency room or followup with your primary care doctor   Increase activity slowly      Discharge instructions      Comments:   Resume diet as tolerated   Call MD for:  persistant nausea and vomiting         DISCHARGE EXAM:  General appearance: alert, cooperative and no distress  Cardiovascular-wise regular rate and rhythm with positive S1-S2. No gallops  Lungs good air movement clear to auscultation.  Abdomen positive bowel sounds nontender nondistended. Filed Vitals:    09/10/10 0510  09/10/10 1500  09/10/10 2117  09/11/10 0419   BP:  98/56  89/62  100/70  91/59  Pulse:   83  92  102   Temp:   98.3 F (36.8 C)  98.2 F (36.8 C)  98.5 F (36.9 C)   TempSrc:       Resp:   16  16  20    Height:       Weight:       SpO2:   100%  97%  99%     Intake/Output Summary (Last 24 hours) at 09/11/10 1142   Basename 09/11/10 0509 09/10/10 0548  WBC 5.1 4.2  NEUTROABS -- --  HGB 8.3* 8.5*  HCT 25.6* 25.8*  MCV 97.7 97.7  PLT 91* 83*    Signed: FELIZ ORTIZ, ABRAHAM 09/12/2010, 8:51 AM

## 2010-09-12 NOTE — Progress Notes (Signed)
UR Chart Review Completed  

## 2010-09-12 NOTE — Progress Notes (Signed)
Pt discharged home via family; Pt and family given and explained all discharge instructions, carenotes, and prescriptions; pt and family stated understanding and denied questions/concerns; all f/u appointments in place; IV removed without complicaitons; pt stable at time of discharge Pt discharged home via family; Pt and family given and explained all discharge instructions, carenotes, and prescriptions; pt and family stated understanding and denied questions/concerns; all f/u appointments in place; IV removed without complicaitons; pt stable at time of discharge  

## 2010-09-13 ENCOUNTER — Ambulatory Visit (INDEPENDENT_AMBULATORY_CARE_PROVIDER_SITE_OTHER): Payer: PRIVATE HEALTH INSURANCE | Admitting: *Deleted

## 2010-09-13 DIAGNOSIS — Z954 Presence of other heart-valve replacement: Secondary | ICD-10-CM

## 2010-09-13 DIAGNOSIS — Z7901 Long term (current) use of anticoagulants: Secondary | ICD-10-CM

## 2010-09-13 DIAGNOSIS — I059 Rheumatic mitral valve disease, unspecified: Secondary | ICD-10-CM

## 2010-09-18 ENCOUNTER — Other Ambulatory Visit (HOSPITAL_COMMUNITY): Payer: Self-pay | Admitting: Oncology

## 2010-09-18 ENCOUNTER — Encounter (HOSPITAL_COMMUNITY): Payer: Self-pay

## 2010-09-18 ENCOUNTER — Ambulatory Visit (HOSPITAL_COMMUNITY): Payer: PRIVATE HEALTH INSURANCE

## 2010-09-18 ENCOUNTER — Encounter: Payer: PRIVATE HEALTH INSURANCE | Admitting: *Deleted

## 2010-09-18 ENCOUNTER — Encounter (HOSPITAL_BASED_OUTPATIENT_CLINIC_OR_DEPARTMENT_OTHER): Payer: PRIVATE HEALTH INSURANCE

## 2010-09-18 DIAGNOSIS — M329 Systemic lupus erythematosus, unspecified: Secondary | ICD-10-CM

## 2010-09-18 DIAGNOSIS — D594 Other nonautoimmune hemolytic anemias: Secondary | ICD-10-CM

## 2010-09-18 DIAGNOSIS — Z171 Estrogen receptor negative status [ER-]: Secondary | ICD-10-CM

## 2010-09-18 DIAGNOSIS — C50219 Malignant neoplasm of upper-inner quadrant of unspecified female breast: Secondary | ICD-10-CM

## 2010-09-18 DIAGNOSIS — E538 Deficiency of other specified B group vitamins: Secondary | ICD-10-CM

## 2010-09-18 DIAGNOSIS — Z09 Encounter for follow-up examination after completed treatment for conditions other than malignant neoplasm: Secondary | ICD-10-CM

## 2010-09-18 DIAGNOSIS — C50919 Malignant neoplasm of unspecified site of unspecified female breast: Secondary | ICD-10-CM

## 2010-09-18 LAB — DIFFERENTIAL
Basophils Absolute: 0 10*3/uL (ref 0.0–0.1)
Basophils Relative: 1 % (ref 0–1)
Eosinophils Absolute: 0.1 10*3/uL (ref 0.0–0.7)
Eosinophils Relative: 1 % (ref 0–5)
Monocytes Absolute: 0.3 10*3/uL (ref 0.1–1.0)

## 2010-09-18 LAB — CBC
HCT: 32.2 % — ABNORMAL LOW (ref 36.0–46.0)
MCH: 32.4 pg (ref 26.0–34.0)
MCHC: 32.6 g/dL (ref 30.0–36.0)
MCV: 99.4 fL (ref 78.0–100.0)
RDW: 18.2 % — ABNORMAL HIGH (ref 11.5–15.5)

## 2010-09-18 LAB — LIPID PANEL
HDL: 47 mg/dL (ref >39)
LDL Cholesterol: 160 mg/dL — ABNORMAL HIGH (ref 0–99)

## 2010-09-18 LAB — TSH: TSH: 3.291 u[IU]/mL (ref 0.350–4.500)

## 2010-09-18 MED ORDER — CYANOCOBALAMIN 1000 MCG/ML IJ SOLN
INTRAMUSCULAR | Status: AC
  Administered 2010-09-18: 1000 ug via INTRAMUSCULAR
  Filled 2010-09-18: qty 1

## 2010-09-18 MED ORDER — CYANOCOBALAMIN 1000 MCG/ML IJ SOLN
1000.0000 ug | Freq: Once | INTRAMUSCULAR | Status: AC
  Administered 2010-09-18: 1000 ug via INTRAMUSCULAR

## 2010-09-18 NOTE — Patient Instructions (Signed)
[facility]  Discharge Instructions   SPECIAL INSTRUCTIONS/FOLLOW-UP: We are running blood work and will call you if we need you to return.     I acknowledge that I have been informed and understand all the instructions given to me and received a copy. I do not have any more questions at this time, but understand that I may call the Specialty Clinic at [facility] at [phone] during business hours should I have any further questions or need assistance in obtaining follow-up care.    __________________________________________  _____________  __________ Signature of Patient or Authorized Representative            Date                   Time    __________________________________________ Nurse's Signature

## 2010-09-18 NOTE — Progress Notes (Signed)
On this day I gave the patient: Taxotere 105 mg IV, Carboplatin 562 mg IV, Dilaudid mg IV x2 doses at 1145 and at 1415, Phenergan 25 mg IV, and Zofran 24mg  IV.  Certain drugs would not release from treatment plan.

## 2010-09-18 NOTE — Progress Notes (Signed)
Taxotere 105mg  was given over ninety minutes.  Carboplatin 562mg  given over two hours.

## 2010-09-18 NOTE — Progress Notes (Signed)
CC:   Sandria Bales. Ezzard Standing, M.D. Zenovia Jordan, MD Corrie Mckusick, M.D. Billie Lade, Ph.D., M.D. Learta Codding, MD,FACC  IDENTIFYING STATEMENT:  The patient is a 40 year old woman with breast cancer who presents for followup.  Mrs. Gilson completed cycle 4 with Taxotere and carboplatin.  She is very pleased about this.  She tells me that she was hospitalized after the last cycle for dehydration and an over therapeutic INR.  She however reports fair energy levels but notes that she is not able to exercise as much as she used to.  She has not noted any bruising or bleeding.  She has no residual chemotherapy side effects such as nausea, vomiting, or thrush. She remains afebrile.  She has history of systemic lupus which is not as bothersome as it has been in the past.  MEDICATIONS:  Documented and as in intake sheet.  ALLERGIES:  None.  REVIEW OF SYSTEMS:  As above.  Specifically notes fatigue but denies pain.  Rest of 10-point review of systems negative.  PHYSICAL EXAMINATION:  The patient is a well-appearing, well-nourished woman in no distress.  Vitals:  Pulse 109, blood pressure 102/71, temperature 98.3, respirations 16, weight 163 pounds.  HEENT:  Head is atraumatic, normocephalic.  Sclerae are anicteric.  Mouth moist without thrush.  Neck:  Supple.  Chest:  Clear to percussion and auscultation. Breast exam notes a well-healed left mastectomy scar with no surrounding chest wall lesions.  Right breast notes no dominant masses or nipple discharge.  Abdomen:  Soft, nontender.  Bowel sounds present. Extremities:  No edema.  LABORATORY DATA:  Pending.  IMPRESSION AND PLAN:  Mrs. 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  She received 2 cycles of preoperative chemotherapy followed by a mastectomy on 04/20/2010, that showed no evidence of disease  within  the lymph nodes but a residual grade 3, 1.6 cm tumor.  Tumor was ER/PR  Negative, HER2/neu negative, and Ki-67 marker was 92%.  LVI was not identified. She then went on to receive 4 cycles of Taxotere with carboplatin and received her fourth cycle on 08/28/2010.  She is to obtain a stat CBC before she leaves the clinic. We will also have her obtain a comprehensive metabolic panel, LDH and vitamin D level.  The patient has history of hypothyroidism and has requested that we draw a thyroid function study and lipid profile.  The results will be faxed to Dr. Lamar Blinks office. She has been scheduled to follow up in 2 months' time and is due for mammogram at around that period.  The patient has history of systemic lupus erythematosus.  She has history of hemolytic anemia and takes folic acid daily. I have asked that she supplement her diet with a multivitamin.  She is due for vitamin B12 injection in 2 days' time but I will bring this forward and have her receive the medicine in the clinic before leaving today.    ______________________________ Laurice Record, M.D. LIO/MEDQ  D:  09/18/2010  T:  09/18/2010  Job:  [id number]

## 2010-09-19 ENCOUNTER — Ambulatory Visit (INDEPENDENT_AMBULATORY_CARE_PROVIDER_SITE_OTHER): Payer: Self-pay | Admitting: *Deleted

## 2010-09-19 DIAGNOSIS — Z954 Presence of other heart-valve replacement: Secondary | ICD-10-CM

## 2010-09-19 DIAGNOSIS — Z7901 Long term (current) use of anticoagulants: Secondary | ICD-10-CM

## 2010-09-19 DIAGNOSIS — I059 Rheumatic mitral valve disease, unspecified: Secondary | ICD-10-CM

## 2010-09-19 DIAGNOSIS — R0989 Other specified symptoms and signs involving the circulatory and respiratory systems: Secondary | ICD-10-CM

## 2010-09-19 NOTE — Progress Notes (Signed)
On 08/31/10 pt received the following: Normal Saline infusion 0930 to 1412. Ativan 1mg  ivp at 0934. Dilaudid 1mg  ivp at 0937. Zofran 24mg  iv infusion over 20 minutes at 1005. Phenergan 25mg  ivp at 1114. Dilaudid 1mg  ivp at 1350. Ativan 1mg  ivp at 1410. Heparin 500 units flush at 1413.

## 2010-09-19 NOTE — Progress Notes (Signed)
08/29/10 pt received the following: Normal Saline flush 10cc at 0909. Normal Saline infusion from 0910 to 1330. Zofran 24mg  infusion at 0925 over 20 minutes. Phenergan 25mg  ivp at 1000. Ativan 1mg  ivp at 1004. Dilaudid 1mg  ivp at 1006. Dilaudid 1mg  ivp at 1325. Heparin 500 units flush at 1331.

## 2010-09-19 NOTE — Progress Notes (Signed)
On

## 2010-09-19 NOTE — Progress Notes (Signed)
On 09/01/10 pt received the following: Normal Saline infusion from 0848 to 1318. Phenergan 25mg  ivp at 0845. Dilaudid 1mg  ivp at 0845. Ativan 1mg  ivp at 0855. Zofran 24mg  infusion at 0948 to infuse over 20 minutes. Ativan 1 mg ivp at 1132. Dilaudid 1mg  ivp at 1238. Heparin 500 units flush at 1319.

## 2010-09-20 LAB — VITAMIN D 1,25 DIHYDROXY
Vitamin D 1, 25 (OH)2 Total: 38 pg/mL (ref 18–72)
Vitamin D3 1, 25 (OH)2: 38 pg/mL

## 2010-09-21 LAB — T4, FREE: Free T4: 1.08 ng/dL (ref 0.80–1.70)

## 2010-09-22 ENCOUNTER — Ambulatory Visit (HOSPITAL_COMMUNITY): Payer: PRIVATE HEALTH INSURANCE

## 2010-09-22 ENCOUNTER — Ambulatory Visit (INDEPENDENT_AMBULATORY_CARE_PROVIDER_SITE_OTHER): Payer: PRIVATE HEALTH INSURANCE | Admitting: *Deleted

## 2010-09-22 DIAGNOSIS — Z954 Presence of other heart-valve replacement: Secondary | ICD-10-CM

## 2010-09-22 DIAGNOSIS — I059 Rheumatic mitral valve disease, unspecified: Secondary | ICD-10-CM

## 2010-09-22 DIAGNOSIS — Z7901 Long term (current) use of anticoagulants: Secondary | ICD-10-CM

## 2010-09-28 NOTE — Progress Notes (Signed)
08/29/10 Reason for encounter: dehydration, nausea, and muscle pain

## 2010-09-29 ENCOUNTER — Ambulatory Visit (INDEPENDENT_AMBULATORY_CARE_PROVIDER_SITE_OTHER): Payer: Self-pay | Admitting: Cardiology

## 2010-09-29 DIAGNOSIS — R0989 Other specified symptoms and signs involving the circulatory and respiratory systems: Secondary | ICD-10-CM

## 2010-09-29 LAB — POCT INR: INR: 4.3

## 2010-10-02 NOTE — Progress Notes (Signed)
Neulasta 6mg  given subcutaneously in the abd on August 30, 2010 in the am. Site WNL. Tolerated well.

## 2010-10-16 ENCOUNTER — Ambulatory Visit (INDEPENDENT_AMBULATORY_CARE_PROVIDER_SITE_OTHER): Payer: PRIVATE HEALTH INSURANCE | Admitting: *Deleted

## 2010-10-16 DIAGNOSIS — Z954 Presence of other heart-valve replacement: Secondary | ICD-10-CM

## 2010-10-16 DIAGNOSIS — Z7901 Long term (current) use of anticoagulants: Secondary | ICD-10-CM

## 2010-10-16 DIAGNOSIS — I059 Rheumatic mitral valve disease, unspecified: Secondary | ICD-10-CM

## 2010-10-16 LAB — POCT INR: INR: 6.8

## 2010-10-18 ENCOUNTER — Ambulatory Visit (INDEPENDENT_AMBULATORY_CARE_PROVIDER_SITE_OTHER): Payer: PRIVATE HEALTH INSURANCE | Admitting: *Deleted

## 2010-10-18 ENCOUNTER — Encounter (HOSPITAL_COMMUNITY): Payer: PRIVATE HEALTH INSURANCE | Attending: Oncology

## 2010-10-18 DIAGNOSIS — I059 Rheumatic mitral valve disease, unspecified: Secondary | ICD-10-CM

## 2010-10-18 DIAGNOSIS — Z954 Presence of other heart-valve replacement: Secondary | ICD-10-CM

## 2010-10-18 DIAGNOSIS — C50919 Malignant neoplasm of unspecified site of unspecified female breast: Secondary | ICD-10-CM

## 2010-10-18 DIAGNOSIS — Z7901 Long term (current) use of anticoagulants: Secondary | ICD-10-CM

## 2010-10-18 DIAGNOSIS — D594 Other nonautoimmune hemolytic anemias: Secondary | ICD-10-CM

## 2010-10-18 DIAGNOSIS — E538 Deficiency of other specified B group vitamins: Secondary | ICD-10-CM | POA: Insufficient documentation

## 2010-10-18 DIAGNOSIS — M329 Systemic lupus erythematosus, unspecified: Secondary | ICD-10-CM

## 2010-10-18 LAB — POCT INR: INR: 2.2

## 2010-10-18 MED ORDER — CYANOCOBALAMIN 1000 MCG/ML IJ SOLN
1000.0000 ug | Freq: Once | INTRAMUSCULAR | Status: AC
  Administered 2010-10-18: 1000 ug via INTRAMUSCULAR

## 2010-10-18 NOTE — Progress Notes (Signed)
[name] presents today for injection per MD orders. Vitamin B12  administered IM in left Upper Arm. Administration without incident. Patient tolerated well.

## 2010-10-25 ENCOUNTER — Ambulatory Visit (HOSPITAL_COMMUNITY): Admission: RE | Admit: 2010-10-25 | Payer: PRIVATE HEALTH INSURANCE | Source: Ambulatory Visit

## 2010-10-25 ENCOUNTER — Ambulatory Visit (INDEPENDENT_AMBULATORY_CARE_PROVIDER_SITE_OTHER): Payer: PRIVATE HEALTH INSURANCE | Admitting: *Deleted

## 2010-10-25 ENCOUNTER — Ambulatory Visit (HOSPITAL_COMMUNITY): Payer: PRIVATE HEALTH INSURANCE

## 2010-10-25 DIAGNOSIS — Z7901 Long term (current) use of anticoagulants: Secondary | ICD-10-CM

## 2010-10-25 DIAGNOSIS — Z954 Presence of other heart-valve replacement: Secondary | ICD-10-CM

## 2010-10-25 DIAGNOSIS — I059 Rheumatic mitral valve disease, unspecified: Secondary | ICD-10-CM

## 2010-10-26 ENCOUNTER — Encounter (INDEPENDENT_AMBULATORY_CARE_PROVIDER_SITE_OTHER): Payer: Self-pay | Admitting: Surgery

## 2010-10-26 ENCOUNTER — Ambulatory Visit (INDEPENDENT_AMBULATORY_CARE_PROVIDER_SITE_OTHER): Payer: PRIVATE HEALTH INSURANCE | Admitting: Surgery

## 2010-10-26 VITALS — BP 118/82 | HR 60 | Wt 163.4 lb

## 2010-10-26 DIAGNOSIS — C50919 Malignant neoplasm of unspecified site of unspecified female breast: Secondary | ICD-10-CM

## 2010-10-26 NOTE — Progress Notes (Signed)
ASSESSMENT AND PLAN: 1.  T1c, N0 left breast cancer.  Completed chemotx in July 2012.  Doing well.  She's interested in talking to plastic surgery about reconstruction options.  We will arrange for her to see someone.  She'll see me back in 6 months.  2.  Peripheral neuropath, secondary to chemo.  Hands have improved.  The numbness in legs/feet are improving.  3.  SLE. 4.  History of mitral valve replacement, on chronic Coumadin.  Sees Dr. Donnella Bi.  HISTORY OF PRESENT ILLNESS: Chief Complaint  Patient presents with  . Breast Cancer Long Term Follow Up    [name] is a 40 y.o. (DOB: 03-07-1970)  white female who is a patient of GOLDING,JOHN CABOT, MD and comes to me today for left breast cancer.    She is doing well.  She completed chemotx in July.  Her hair is coming back and is about 2 inches long (and white).  The numbness in her hands is better.  And the numbness in legs/feet are improving.  She is accompanied with her sister.  Path (side, TNM): T1c, N0 Surgery: left mastectomy  Date: 04/20/10 Size of tumor: 1.6 cm Nodes: 0/3 ER: 0% PR: 0% Ki67: 92  HER2Neu: Neg  Medical Oncologist: Neijstrom Radiation Oncologist: Kinard  (she did not require radiation tx)   PHYSICAL EXAM: BP 118/82  Pulse 60  Wt 163 lb 6 oz (74.106 kg)  HEENT:  Pupils equal.  Dentition good.  No injury. NECK:  Supple.  No thyroid mass. LYMPH NODES:  No cervical, supraclavicular, or axillary adenopathy. BREASTS -  RIGHT:  No palpable mass or nodule.  No nipple discharge.  Upper breast where the porta cath was she has skin pigmentation.   LEFT:  Post mastectomy. UPPER EXTREMITIES:  No evidence of lymphedema.   DATA REVIEWED: Mammogram was to be done yesterday, but the machine was broken.

## 2010-11-01 ENCOUNTER — Ambulatory Visit (HOSPITAL_COMMUNITY)
Admission: RE | Admit: 2010-11-01 | Discharge: 2010-11-01 | Disposition: A | Discharge status: Home / Self Care | Payer: PRIVATE HEALTH INSURANCE | Source: Ambulatory Visit | Attending: Oncology | Admitting: Oncology

## 2010-11-01 ENCOUNTER — Other Ambulatory Visit (HOSPITAL_COMMUNITY): Payer: Self-pay | Admitting: Oncology

## 2010-11-01 DIAGNOSIS — Z09 Encounter for follow-up examination after completed treatment for conditions other than malignant neoplasm: Secondary | ICD-10-CM

## 2010-11-01 DIAGNOSIS — Z139 Encounter for screening, unspecified: Secondary | ICD-10-CM

## 2010-11-02 ENCOUNTER — Ambulatory Visit (INDEPENDENT_AMBULATORY_CARE_PROVIDER_SITE_OTHER): Payer: PRIVATE HEALTH INSURANCE | Admitting: *Deleted

## 2010-11-02 DIAGNOSIS — I059 Rheumatic mitral valve disease, unspecified: Secondary | ICD-10-CM

## 2010-11-02 DIAGNOSIS — Z7901 Long term (current) use of anticoagulants: Secondary | ICD-10-CM

## 2010-11-02 DIAGNOSIS — Z954 Presence of other heart-valve replacement: Secondary | ICD-10-CM

## 2010-11-08 ENCOUNTER — Telehealth: Payer: Self-pay | Admitting: Cardiology

## 2010-11-08 NOTE — Telephone Encounter (Signed)
Dr. Etter Sjogren called today requesting to speak with Dr. Andee Lineman in regards to patient [name]. Mrs. Shober is wanting to have breast reconstruction and Dr. Odis Luster needs to talk to Dr. Andee Lineman about Her being on coumadin. Dr. Odis Luster is a Engineer, petroleum. Please call his office at [phone] or after 5:00pm Please call [phone] (cell no.)

## 2010-11-09 NOTE — Telephone Encounter (Signed)
Has been taken care off. Staff message mailed to Weston Brass and Vashti Hey and recommendations discussed with Dr. Odis Luster by phone.

## 2010-11-10 ENCOUNTER — Telehealth: Payer: Self-pay | Admitting: Pharmacist

## 2010-11-10 NOTE — Telephone Encounter (Signed)
Pt having breast reconstructive surgery by Dr. Odis Luster on 9/27.  Per Dr. Andee Lineman, pt needs Lovenox bridge.  He would like to do anti-Xa level prior to surgery to ensure correct dosing and decrease risk of post-op bleeding.   Weight- 74kg, SCr- 0.62 (per Dr. Earnestine Leys- unable to find recent in [facility] chart), CrCl- >141mL/min.  Will dose at 1mg /kg q12h   9/21- normal dose of Coumadin  9/22- Last dose of Coumadin 9/23- No Coumadin or Lovenox  9/24- Lovenox 80mg  in AM and PM 9/25- Lovenox 80mg  in AM and PM 9/26- Lovenox 80mg  in AM and PM 9/27- Day of Procedure.  Pt will be admitted to [facility] for several days after procedure.  Will f/u once discharged.   Will check anti-Xa level 4 hours after AM dose on 9/25 to ensure therapeutic levels.  Goal anti-Xa is 0.6-1.2. Pt is aware of dosing and lab work requirements.  Will fax instructions to [facility] with Dr. Odis Luster at [phone].

## 2010-11-13 ENCOUNTER — Encounter: Payer: PRIVATE HEALTH INSURANCE | Admitting: *Deleted

## 2010-11-13 ENCOUNTER — Telehealth: Payer: Self-pay | Admitting: *Deleted

## 2010-11-13 ENCOUNTER — Other Ambulatory Visit: Payer: Self-pay | Admitting: Cardiology

## 2010-11-13 LAB — CROSSMATCH: ABO/RH(D): O POS

## 2010-11-13 LAB — CBC
MCHC: 36
RDW: 13.7

## 2010-11-13 LAB — DIFFERENTIAL
Eosinophils Absolute: 0.2
Lymphocytes Relative: 16
Lymphs Abs: 0.7
Monocytes Relative: 8
Neutrophils Relative %: 69

## 2010-11-13 LAB — URINE MICROSCOPIC-ADD ON

## 2010-11-13 LAB — URINALYSIS, ROUTINE W REFLEX MICROSCOPIC
Glucose, UA: NEGATIVE
Hgb urine dipstick: NEGATIVE
Protein, ur: NEGATIVE
pH: 5

## 2010-11-13 LAB — COMPREHENSIVE METABOLIC PANEL
ALT: 22
AST: 25
Calcium: 9.1
Creatinine, Ser: 1.06
GFR calc Af Amer: >60
Glucose, Bld: 79
Sodium: 136
Total Protein: 7.9

## 2010-11-14 ENCOUNTER — Other Ambulatory Visit: Payer: Self-pay | Admitting: Cardiology

## 2010-11-14 LAB — HEPARIN ANTI-XA: Heparin LMW: 1.28 IU/mL

## 2010-11-14 LAB — PROTIME-INR: INR: 1.1

## 2010-11-14 LAB — APTT: aPTT: 36

## 2010-11-15 ENCOUNTER — Encounter (HOSPITAL_COMMUNITY)
Admission: RE | Admit: 2010-11-15 | Discharge: 2010-11-15 | Disposition: A | Discharge status: Home / Self Care | Payer: PRIVATE HEALTH INSURANCE | Source: Ambulatory Visit | Attending: Plastic Surgery | Admitting: Plastic Surgery

## 2010-11-15 ENCOUNTER — Ambulatory Visit (HOSPITAL_COMMUNITY): Payer: PRIVATE HEALTH INSURANCE

## 2010-11-15 LAB — APTT: aPTT: 46 seconds — ABNORMAL HIGH (ref 24–37)

## 2010-11-15 LAB — BASIC METABOLIC PANEL
BUN: 10 mg/dL (ref 6–23)
Chloride: 102 mEq/L (ref 96–112)
Creatinine, Ser: 0.73 mg/dL (ref 0.50–1.10)
GFR calc Af Amer: >60 mL/min (ref >60)
GFR calc non Af Amer: >60 mL/min (ref >60)
Potassium: 3.9 mEq/L (ref 3.5–5.1)

## 2010-11-15 LAB — CBC
HCT: 34.2 % — ABNORMAL LOW (ref 36.0–46.0)
Hemoglobin: 11.7 g/dL — ABNORMAL LOW (ref 12.0–15.0)
WBC: 4.9 10*3/uL (ref 4.0–10.5)

## 2010-11-15 LAB — SURGICAL PCR SCREEN: MRSA, PCR: NEGATIVE

## 2010-11-15 LAB — PROTIME-INR
INR: 1.19 (ref 0.00–1.49)
Prothrombin Time: 15.4 seconds — ABNORMAL HIGH (ref 11.6–15.2)

## 2010-11-15 NOTE — Telephone Encounter (Signed)
Pt had anti-Xa level drawn on 9/25 at 2pm.  Results were 1.28.  LM with pt to verify time she took syringe in relation to lab draw.

## 2010-11-15 NOTE — Telephone Encounter (Signed)
Spoke with pt.  She took dose at ~9:30 am.  Discussed with Lupita Leash at Dr. Maxcine Ham office. Would suggest dosing Lovenox at 70mg  every 12 hours post-procedure in order to aim for lower anti-Xa level closer to 0.6.  If pt is discharged on Lovenox 70mg , please make sure she is aware of how to expel 10mg  from 80mg  syringe.

## 2010-11-16 ENCOUNTER — Inpatient Hospital Stay (HOSPITAL_COMMUNITY)
Admission: RE | Admit: 2010-11-16 | Discharge: 2010-11-18 | DRG: 585 | Disposition: A | Discharge status: Home / Self Care | Payer: PRIVATE HEALTH INSURANCE | Source: Ambulatory Visit | Attending: Plastic Surgery | Admitting: Plastic Surgery

## 2010-11-16 ENCOUNTER — Encounter: Payer: Self-pay | Admitting: *Deleted

## 2010-11-16 DIAGNOSIS — Z954 Presence of other heart-valve replacement: Secondary | ICD-10-CM

## 2010-11-16 DIAGNOSIS — Z421 Encounter for breast reconstruction following mastectomy: Principal | ICD-10-CM

## 2010-11-16 DIAGNOSIS — Z853 Personal history of malignant neoplasm of breast: Secondary | ICD-10-CM

## 2010-11-16 DIAGNOSIS — Z01812 Encounter for preprocedural laboratory examination: Secondary | ICD-10-CM

## 2010-11-16 HISTORY — PX: TISSUE EXPANDER PLACEMENT: SHX2530

## 2010-11-16 LAB — PROTEIN ELECTROPHORESIS, SERUM
Alpha-1-Globulin: 4
Alpha-2-Globulin: 6.3 — ABNORMAL LOW
Beta Globulin: 5.8
Gamma Globulin: 25.6 — ABNORMAL HIGH

## 2010-11-16 LAB — IMMUNOFIXATION ELECTROPHORESIS: IgG (Immunoglobin G), Serum: 2580 — ABNORMAL HIGH

## 2010-11-16 LAB — FERRITIN: Ferritin: 201 (ref 10–291)

## 2010-11-16 LAB — COMPREHENSIVE METABOLIC PANEL
ALT: 13
AST: 21
Albumin: 4.4
CO2: 27
Calcium: 9.5
Creatinine, Ser: 0.83
GFR calc Af Amer: >60
Sodium: 133 — ABNORMAL LOW

## 2010-11-16 LAB — DIFFERENTIAL
Eosinophils Absolute: 0.3
Eosinophils Relative: 5
Lymphocytes Relative: 11 — ABNORMAL LOW
Lymphs Abs: 0.7
Monocytes Absolute: 0.3
Monocytes Relative: 5

## 2010-11-16 LAB — CBC
MCHC: 35.7
MCV: 88.7
Platelets: 216
RBC: 4.03
WBC: 6.2

## 2010-11-16 LAB — IRON AND TIBC
Saturation Ratios: 17 — ABNORMAL LOW
UIBC: 293

## 2010-11-16 LAB — HAPTOGLOBIN: Haptoglobin: <6 — ABNORMAL LOW

## 2010-11-16 LAB — RETICULOCYTES: Retic Ct Pct: 1.9

## 2010-11-16 LAB — LACTATE DEHYDROGENASE: LDH: 301 — ABNORMAL HIGH

## 2010-11-17 DIAGNOSIS — Z7901 Long term (current) use of anticoagulants: Secondary | ICD-10-CM

## 2010-11-17 LAB — CBC
HCT: 33.8 — ABNORMAL LOW
Platelets: 184
RBC: 3.79 — ABNORMAL LOW
WBC: 7.2

## 2010-11-17 LAB — PROTIME-INR
INR: 2.9 — ABNORMAL HIGH
Prothrombin Time: 32.7 — ABNORMAL HIGH

## 2010-11-17 LAB — HAPTOGLOBIN: Haptoglobin: <6 — ABNORMAL LOW

## 2010-11-17 LAB — ANTI-THYROGLOBULIN ANTIBODY: Thyroglobulin Ab: <30 U/mL

## 2010-11-20 ENCOUNTER — Ambulatory Visit (INDEPENDENT_AMBULATORY_CARE_PROVIDER_SITE_OTHER): Payer: PRIVATE HEALTH INSURANCE | Admitting: *Deleted

## 2010-11-20 DIAGNOSIS — I059 Rheumatic mitral valve disease, unspecified: Secondary | ICD-10-CM

## 2010-11-20 DIAGNOSIS — Z7901 Long term (current) use of anticoagulants: Secondary | ICD-10-CM

## 2010-11-20 DIAGNOSIS — Z954 Presence of other heart-valve replacement: Secondary | ICD-10-CM

## 2010-11-20 LAB — CBC
HCT: 28 — ABNORMAL LOW
Hemoglobin: 9.9 — ABNORMAL LOW
MCHC: 35.1
MCV: 94
RBC: 3.59 — ABNORMAL LOW
RBC: 4.11
RBC: 3.02 — ABNORMAL LOW
RDW: 16.1 — ABNORMAL HIGH
WBC: 10.8 — ABNORMAL HIGH
WBC: 15.3 — ABNORMAL HIGH

## 2010-11-20 LAB — COMPREHENSIVE METABOLIC PANEL
ALT: 27
AST: 21
Albumin: 3.8
CO2: 27
Chloride: 104
GFR calc Af Amer: >60
GFR calc non Af Amer: 55 — ABNORMAL LOW
Potassium: 4.2
Sodium: 139
Total Bilirubin: 0.7

## 2010-11-20 LAB — DIFFERENTIAL
Basophils Absolute: 0
Eosinophils Absolute: 0
Eosinophils Absolute: 0
Eosinophils Relative: 0
Lymphocytes Relative: 5 — ABNORMAL LOW
Lymphs Abs: 0.7
Monocytes Absolute: 0.2
Monocytes Relative: 1 — ABNORMAL LOW
Neutrophils Relative %: 94 — ABNORMAL HIGH

## 2010-11-20 LAB — HAPTOGLOBIN: Haptoglobin: <6 — ABNORMAL LOW

## 2010-11-20 LAB — RETICULOCYTES
RBC.: 4.11
RBC.: 3.02 — ABNORMAL LOW
Retic Count, Absolute: 172.6
Retic Ct Pct: 4.2 — ABNORMAL HIGH
Retic Ct Pct: 4.4 — ABNORMAL HIGH

## 2010-11-20 LAB — POCT INR: INR: 1.1

## 2010-11-20 MED ORDER — ENOXAPARIN SODIUM 80 MG/0.8ML ~~LOC~~ SOLN: 70.0000 mg | Freq: Two times a day (BID) | SUBCUTANEOUS | Status: DC

## 2010-11-21 LAB — CBC
Hemoglobin: 11.6 g/dL — ABNORMAL LOW (ref 12.0–15.0)
MCHC: 34.8 g/dL (ref 30.0–36.0)
MCV: 92.5 fL (ref 78.0–100.0)
Platelets: 218 10*3/uL (ref 150–400)
RBC: 3.67 MIL/uL — ABNORMAL LOW (ref 3.87–5.11)
RBC: 3.49 MIL/uL — ABNORMAL LOW (ref 3.87–5.11)
WBC: 5 10*3/uL (ref 4.0–10.5)
WBC: 5.2 10*3/uL (ref 4.0–10.5)

## 2010-11-21 LAB — HAPTOGLOBIN
Haptoglobin: <6 mg/dL — ABNORMAL LOW (ref 16–200)
Haptoglobin: 105 mg/dL (ref 16–200)

## 2010-11-21 LAB — RETICULOCYTES
Retic Count, Absolute: 51.4 10*3/uL (ref 19.0–186.0)
Retic Ct Pct: 1.4 % (ref 0.4–3.1)

## 2010-11-22 ENCOUNTER — Other Ambulatory Visit: Payer: Self-pay | Admitting: Cardiology

## 2010-11-22 ENCOUNTER — Ambulatory Visit (INDEPENDENT_AMBULATORY_CARE_PROVIDER_SITE_OTHER): Payer: PRIVATE HEALTH INSURANCE | Admitting: *Deleted

## 2010-11-22 DIAGNOSIS — I059 Rheumatic mitral valve disease, unspecified: Secondary | ICD-10-CM

## 2010-11-22 DIAGNOSIS — Z954 Presence of other heart-valve replacement: Secondary | ICD-10-CM

## 2010-11-22 DIAGNOSIS — Z7901 Long term (current) use of anticoagulants: Secondary | ICD-10-CM

## 2010-11-22 LAB — POCT INR: INR: 1.1

## 2010-11-22 LAB — CBC
HCT: 39.8
Hemoglobin: 13.6
RBC: 4.31
WBC: 16.5 — ABNORMAL HIGH

## 2010-11-22 LAB — RETICULOCYTES
RBC.: 4.31
Retic Count, Absolute: 181

## 2010-11-23 ENCOUNTER — Encounter: Payer: PRIVATE HEALTH INSURANCE | Admitting: *Deleted

## 2010-11-23 LAB — CBC
HCT: 33.4 % — ABNORMAL LOW (ref 36.0–46.0)
HCT: 34.6 % — ABNORMAL LOW (ref 36.0–46.0)
HCT: 35.9 % — ABNORMAL LOW (ref 36.0–46.0)
Hemoglobin: 11.7 g/dL — ABNORMAL LOW (ref 12.0–15.0)
Hemoglobin: 11.9 g/dL — ABNORMAL LOW (ref 12.0–15.0)
MCHC: 34.3 g/dL (ref 30.0–36.0)
MCHC: 35 g/dL (ref 30.0–36.0)
MCV: 89.2 fL (ref 78.0–100.0)
Platelets: 207 10*3/uL (ref 150–400)
RBC: 3.67 MIL/uL — ABNORMAL LOW (ref 3.87–5.11)
RBC: 3.86 MIL/uL — ABNORMAL LOW (ref 3.87–5.11)
RBC: 4.03 MIL/uL (ref 3.87–5.11)
WBC: 4.9 10*3/uL (ref 4.0–10.5)

## 2010-11-23 LAB — BASIC METABOLIC PANEL
Chloride: 100 mEq/L (ref 96–112)
Creat: 0.59 mg/dL (ref 0.50–1.10)
Potassium: 4.5 mEq/L (ref 3.5–5.3)
Sodium: 139 mEq/L (ref 135–145)

## 2010-11-23 LAB — RETICULOCYTES: RBC.: 3.67 MIL/uL — ABNORMAL LOW (ref 3.87–5.11)

## 2010-11-24 NOTE — Op Note (Signed)
NAMEHADYN, AZER NO.:  [id number]  MEDICAL RECORD NO.:  [id number]  LOCATION:  5150                         FACILITY:  [facility]  PHYSICIAN:  Etter Sjogren, M.D.     DATE OF BIRTH:  27-Nov-1970  DATE OF PROCEDURE:  11/16/2010 DATE OF DISCHARGE:                              OPERATIVE REPORT   PREOPERATIVE DIAGNOSIS:  Left breast cancer.  POSTOPERATIVE DIAGNOSIS:  Left breast cancer.  PROCEDURE PERFORMED:  Left breast reconstruction with a tissue expander.  SURGEON:  Etter Sjogren, MD  ANESTHESIA:  General.  ESTIMATED BLOOD LOSS:  20 mL.  DRAINS:  One 19-French.  CLINICAL NOTE:  A 40 year old woman who has had breast cancer, has had mastectomy and now desires reconstruction.  Her situation is complicated by a mitral valve replacement and she does require anticoagulation. This was coordinated with her cardiologist.  He recommended that she transition to Lovenox, off Coumadin and then back to Coumadin postoperatively.  She has been on Lovenox now for 6 days, has been off Coumadin for 5 days, and she presents at this time for her left breast reconstruction.  Procedure risks plus complications discussed with her in detail.  Risks include, but not limited to bleeding, infection, anesthesia complications, healing problems, scarring, failure of device, capsular contracture, wrinkles, ripples, displacement of device, asymmetry, disappointment, pneumothorax, pulmonary embolism, and loss of sensation and loss of range of motion of shoulder.  She understood that she was at a little bit increased risk for bleeding secondary to the need for full anticoagulation approximately 48 hours after the surgery and she wished to proceed.  DESCRIPTION OF PROCEDURE:  The patient was marked in a full upright position in the holding area and then taken to the operating room and placed supine.  After successful induction of general anesthesia, she was prepped with ChloraPrep and  after waiting the instruments for drying, she was draped with Steri-Drapes and sterile towels as well as sterile drapes.  The central aspect of the old mastectomy scar was utilized and dissection carried down through the subcutaneous tissue to the underlying muscle and then a muscle splitting incision was used to carry the dissection deep to the pectoralis major muscle.  Great care taken to avoid damage to underlying chest cavity.  A submuscular space was developed to admission to the implant.  Thorough irrigation with saline and meticulous hemostasis was achieved using electrocautery.  A couple of small vessels were ligated with the vascular Ligaclip applier. Excellent hemostasis having been confirmed, expander was prepared.  This was a Mentor medium-height 650 mL expander.  After thoroughly cleaning gloves, the expander was prepared by placing antibiotic solution over it and then removing all the air and 100 mL of sterile saline was placed using a closed filling system.  The expander returned to the antibiotic solution.  Antibiotic solution also placed in the submuscular space and excellent hemostasis was confirmed again.  A 19-French drain was positioned and brought through a separate stab wound inferolaterally and then secured with 3-0 Prolene suture and then antibiotic solution again placed in the space and the expander was positioned.  An additional 100 mL of sterile saline placed giving a total of  200 mL and antibiotic solution again placed on top of the expander.  The closure with 3-0 Vicryl interrupted figure-of-eight sutures.  Great care taken to avoid damage to underlying tissue expander which was kept under direct vision at all times and then antibiotic solution for irrigation.  Hemostasis again achieved and the skin closure with 3-0 Monocryl interrupted inverted deep dermal sutures and running 3-0 Monocryl subcuticular suture.  Biopatch was placed around the drain and Tegaderm  used to hold that in position and then dry sterile dressings placed and Ace wrap was placed and then the chest vest also placed in order to allow adequate compression in this area, and she was transferred to the recovery room stable having tolerated the procedure well.     Etter Sjogren, M.D.     DB/MEDQ  D:  11/16/2010  T:  11/16/2010  Job:  [id number]  Electronically Signed by Etter Sjogren M.D. on 11/24/2010 01:03:48 PM

## 2010-11-24 NOTE — Discharge Summary (Signed)
[name], IOVINE NO.:  [id number]  MEDICAL RECORD NO.:  [id number]  LOCATION:  5150                         FACILITY:  [facility]  PHYSICIAN:  Etter Sjogren, M.D.     DATE OF BIRTH:  06-07-1970  DATE OF ADMISSION:  11/16/2010 DATE OF DISCHARGE:  11/18/2010                              DISCHARGE SUMMARY   FINAL DIAGNOSIS:  Left breast cancer.  PROCEDURE PERFORMED:  Left breast reconstruction tissue expander.  SUMMARY OF HISTORY AND PHYSICAL:  This is a 40 year old woman who has had breast cancer on left side, has had chemotherapy, did not requiring radiation, has had mastectomy and desired breast reconstruction. Options were discussed.  She desired the use of a tissue expander.  Part of this was dictated by the complicated situation with a mitral valve replacement 4 years ago and the need for full anticoagulation.  She selected use of tissue expanders as a stage procedure with eventual placement of an implant.  Ashby Dawes of this procedure and the risks plus complications were discussed with her in great detail and she understood she was at added risk due to the full anticoagulation and she wished to proceed.  The clinical situation was discussed at length with her cardiologist, Dr. Andee Lineman who also was in agreement with the plan and decided that we should discontinue Coumadin 4-5 days prior to surgery and begin Lovenox retained and also the [facility]madin Clinic was able to adjust the Lovenox doses down to 70 mg subcutaneous twice day instead of the 80.  Her last dose of Lovenox was the night prior to surgery.  COURSE IN HOSPITAL:  On admission, she was taken to surgery at which time breast reconstruction tissue expander was performed.  She tolerated that procedure well.  Postoperatively, she did well.  She did require Dilaudid PCA for pain control and then that was transitioned to Dilaudid p.o. with control of the pain.  She was also on Robaxin as a muscle relaxant.   The chest continues to look very good.  On the second operative day in the evening, the Lovenox was resumed as well as Coumadin.  Cardiology consult was obtained and that was recommended. She was resumed at 70 mg of Lovenox q.12 and Coumadin 5 mg once a day. No bleeding was noted.  The drain continues to function with very thin drainage.  The operative site looks good.  No infection.  No bleeding. It is felt that she is ready to be discharged.  DISPOSITION:  She will be on Lovenox 70 mg twice a day.  A busy nurse will be arranged to help her with the administration of this dosage. She will also be on Coumadin 5 mg once a day until she returns to the Coumadin Clinic and we have asked that she return to the Coumadin Clinic on Monday or Tuesday of this upcoming week.  She will be discharged on Dilaudid 2-4 mg p.o. q.4 h. p.o. for pain, Robaxin 500 mg 1 p.o. q.6 h. p.r.n. muscle spasm, Keflex 500 mg p.o. t.i.d., and the remainder of her medications will remain the same.  She will follow up in the office with me next week.  ACTIVITY RESTRICTIONS:  No raising her left arm overhead.  No lifting. No vigorous activities.  No exercising.  No shower.  Empty the drain 3 times and record the amount.  She will use incentive spirometer at least every hour while she is at home.  Follow up in the office next week. She will wear compression vest at all times.     Etter Sjogren, M.D.     DB/MEDQ  D:  11/18/2010  T:  11/18/2010  Job:  [id number]  Electronically Signed by Etter Sjogren M.D. on 11/24/2010 01:03:56 PM

## 2010-11-28 NOTE — Telephone Encounter (Signed)
Opened in error

## 2010-11-30 ENCOUNTER — Other Ambulatory Visit: Payer: Self-pay | Admitting: Cardiology

## 2010-11-30 NOTE — Telephone Encounter (Signed)
Not sure about this one Lisa/please tell me if she can have this refill

## 2010-12-12 ENCOUNTER — Other Ambulatory Visit: Payer: Self-pay | Admitting: Cardiology

## 2010-12-12 MED ORDER — ENOXAPARIN SODIUM 80 MG/0.8ML ~~LOC~~ SOLN: 70.0000 mg | Freq: Two times a day (BID) | SUBCUTANEOUS | Status: DC

## 2010-12-12 NOTE — Consult Note (Signed)
[name], HADLOCK NO.:  [id number]  MEDICAL RECORD NO.:  [id number]  LOCATION:  5150                         FACILITY:  [facility]  PHYSICIAN:  Luis Abed, MD, FACCDATE OF BIRTH:  August 21, 1970  DATE OF CONSULTATION:  11/17/2010 DATE OF DISCHARGE:                                CONSULTATION   PRIMARY CARE PHYSICIAN:  Corrie Mckusick, MD  CARDIOLOGIST:  Learta Codding, MD, [facility]  CHIEF COMPLAINT:  Management of anticoagulation.  HISTORY OF PRESENT ILLNESS:  [name] is a 40 year old female with a history of mechanical mitral valve, on chronic Coumadin.  She had a left mastectomy in March 2012, and came to the hospital for reconstruction on November 16, 2010.  She had a Lovenox bridge prior to surgery and had taken her last dose of Coumadin on November 11, 2010.  We are asked to see her to set up anticoagulation postoperatively.  Currently, [name] has some postoperative pain, but this is being fairly well controlled and she is otherwise asymptomatic.  PAST MEDICAL HISTORY: 1. Rheumatic heart disease, status post mitral valve replacement with     a mechanical mitral valve at [facility] in 2008     (postpericardiotomy syndrome). 2. Chronic anticoagulation with Coumadin, goal INR 2.5 to 3.5, managed     by [name] at the [facility]madin Clinic. 3. History of pulmonary hypertension. 4. History of Raynaud phenomenon. 5. Anemia. 6. Status post transesophageal echocardiogram in February 2012 showing     mitral valve prosthesis functioning normally, no thrombus, atrial     septal hypertrophy. 7. History of preserved left ventricular function with an EF of 55-60%     by echocardiogram in January 2012. 8. History of breast cancer, status post chemotherapy and radiation     followed by left mastectomy in March 2012. 9. History of cholecystitis causing septic shock in December 2011. 10.History of Port-A-Cath and subsequent infection, status post      removal. 11.Systemic lupus. 12.History of pancytopenia 13.Hypothyroidism.  SURGICAL HISTORY:  She is status post open heart surgery for mitral valve replacement, cardiac catheterization, hysteroscopy with D and C and endometrial ablation, subclavian Port-A-Cath and its removal, laparoscopic cholecystectomy, left simple mastectomy with left axillary sentinel lymph node biopsy and left breast reconstruction with the tissue expander on November 16, 2010.  ALLERGIES:  No known drug allergies.  CURRENT MEDICATIONS: 1. Ancef q.8 h. 2. Colace b.i.d. 3. Cymbalta 30 mg a day. 4. Lovenox 70 mg q.12 h. 5. Folic acid 1 mg a day. 6. Lasix 20 mg a day. 7. Plaquenil 200 mg b.i.d. 8. Robaxin 500 mg q.6 h. 9. Protonix 40 mg a day. 10.Lyrica 100 mg t.i.d. 11.ReQuip 0.5 mg nightly.  SOCIAL HISTORY:  She lives in [place] with her husband.  She is disabled.  She quit tobacco in 2008 with approximately 20-pack-year history.  She does not abuse alcohol or drugs.  FAMILY HISTORY:  Noncontributory.  REVIEW OF SYSTEMS:  She has significant pain at the incision site, but she is receiving narcotics with good of fit.  She has problems with constipation.  She has had difficulty urinating since the catheter came out, but has been able to  go twice on her own.  She has arthralgias and chronic joint aches and pains.  She has not had any melena.  Her appetite is poor postoperatively.  Review of systems is otherwise negative.  PHYSICAL EXAMINATION:  VITAL SIGNS:  Temperature 98.4, blood pressure 102/72, heart rate 93, respiratory rate 20, O2 saturation 94% on 2 liters. GENERAL:  She is a well-developed and well-nourished white female who appears slightly sleepy from pain medications, but answers appropriately. HEENT:  Normal. NECK:  There is no lymphadenopathy, thyromegaly, bruit, or JVD noted. CV:  Her heart is regular in rate and rhythm with an S1-S2 and a crisp valve click is noted.  Distal  pulses are intact in all four extremities. LUNGS:  She has few rales in the bases, but generally good air exchange. SKIN:  Her incision is bandaged and dressed not disturbed.  No rashes or other lesions are noted. ABDOMEN:  Soft and nontender with active bowel sounds. EXTREMITIES:  There is no cyanosis, clubbing, or edema noted. MUSCULOSKELETAL:  There is no joint deformity or effusions. NEUROLOGIC:  She is sleepy from pain control medications, but oriented with no focal deficits noted.  LABORATORY VALUES:  Hemoglobin 11.7, hematocrit 34.2, WBCs 4.9, INR 1.19.  Sodium 139, potassium 3.9, chloride 102, CO2 25, BUN 10, creatinine 0.73, glucose 74.  IMPRESSION:  [name] was seen today by Dr. Myrtis Ser, the patient evaluated and the data reviewed.  She has had Lovenox at 70 mg subcu q.12 h. order to begin tonight.  Her INR is subtherapeutic.  We will also start Coumadin tonight if this is okay with the surgeons.  She has an appointment to get her Coumadin checked on November 20, 2010 at 10 a.m. in [place]. She will be given a prescription for Lovenox to last until Monday with one refill if she is not therapeutic.  [name] moderate is otherwise stable and we will be available p.r.n.     Theodore Demark, PA-C   ______________________________ Luis Abed, MD, [facility]    RB/MEDQ  D:  11/17/2010  T:  11/17/2010  Job:  [id number]  Electronically Signed by Theodore Demark PA-C on 12/11/2010 07:56:27 PM Electronically Signed by Willa Rough MD FACC on 12/12/2010 12:20:25 PM

## 2010-12-19 ENCOUNTER — Encounter (HOSPITAL_COMMUNITY): Payer: PRIVATE HEALTH INSURANCE | Attending: Oncology | Admitting: Oncology

## 2010-12-19 ENCOUNTER — Encounter (HOSPITAL_COMMUNITY): Payer: Self-pay | Admitting: Oncology

## 2010-12-19 ENCOUNTER — Encounter: Payer: Self-pay | Admitting: Cardiology

## 2010-12-19 ENCOUNTER — Ambulatory Visit (INDEPENDENT_AMBULATORY_CARE_PROVIDER_SITE_OTHER): Payer: PRIVATE HEALTH INSURANCE | Admitting: Cardiology

## 2010-12-19 ENCOUNTER — Encounter (HOSPITAL_BASED_OUTPATIENT_CLINIC_OR_DEPARTMENT_OTHER): Payer: PRIVATE HEALTH INSURANCE

## 2010-12-19 VITALS — BP 102/70 | HR 82 | Ht 64.0 in | Wt 173.0 lb

## 2010-12-19 VITALS — BP 101/70 | HR 81 | Temp 97.9°F | Wt 173.2 lb

## 2010-12-19 DIAGNOSIS — I059 Rheumatic mitral valve disease, unspecified: Secondary | ICD-10-CM

## 2010-12-19 DIAGNOSIS — D519 Vitamin B12 deficiency anemia, unspecified: Secondary | ICD-10-CM

## 2010-12-19 DIAGNOSIS — G629 Polyneuropathy, unspecified: Secondary | ICD-10-CM | POA: Insufficient documentation

## 2010-12-19 DIAGNOSIS — C50219 Malignant neoplasm of upper-inner quadrant of unspecified female breast: Secondary | ICD-10-CM

## 2010-12-19 DIAGNOSIS — D591 Autoimmune hemolytic anemia, unspecified: Secondary | ICD-10-CM

## 2010-12-19 DIAGNOSIS — D518 Other vitamin B12 deficiency anemias: Secondary | ICD-10-CM | POA: Insufficient documentation

## 2010-12-19 DIAGNOSIS — M329 Systemic lupus erythematosus, unspecified: Secondary | ICD-10-CM

## 2010-12-19 DIAGNOSIS — E538 Deficiency of other specified B group vitamins: Secondary | ICD-10-CM

## 2010-12-19 DIAGNOSIS — Z7901 Long term (current) use of anticoagulants: Secondary | ICD-10-CM

## 2010-12-19 DIAGNOSIS — G4700 Insomnia, unspecified: Secondary | ICD-10-CM | POA: Insufficient documentation

## 2010-12-19 DIAGNOSIS — G609 Hereditary and idiopathic neuropathy, unspecified: Secondary | ICD-10-CM

## 2010-12-19 MED ORDER — CYANOCOBALAMIN 1000 MCG/ML IJ SOLN: 1000.0000 ug | INTRAMUSCULAR | Status: AC

## 2010-12-19 MED ORDER — CYANOCOBALAMIN 1000 MCG/ML IJ SOLN
INTRAMUSCULAR | Status: AC
  Administered 2010-12-19: 1000 ug via INTRAMUSCULAR
  Filled 2010-12-19: qty 1

## 2010-12-19 MED ORDER — ZOLPIDEM TARTRATE 10 MG PO TABS: 10.0000 mg | ORAL_TABLET | Freq: Every evening | ORAL | Status: DC | PRN

## 2010-12-19 MED ORDER — CYANOCOBALAMIN 1000 MCG/ML IJ SOLN
1000.0000 ug | Freq: Once | INTRAMUSCULAR | Status: AC
  Administered 2010-12-19: 1000 ug via INTRAMUSCULAR

## 2010-12-19 NOTE — Progress Notes (Signed)
HPI  The patient is a 40 year old female with history of mitral valve replacement with a St. Jude's procedures and normal LV function. She status post T1 C. N 0 left breast cancer and completed chemotherapy July 2012. 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 She has history of lupus. She underwent recent plastic surgery with expander implantation. During this. She was covered with Lovenox but just started back on Coumadin yesterday. She has had no major bleeding complications. Anti-10 A. levels were carefully monitored and Lovenox was dose adjusted. She'll follow up in the Coumadin clinic on Thursday for her PT/INR. From a cardiac standpoint she had no complications. She reports no palpitations, orthopnea or PND chest pain or shortness of breath. Her only major complaint is that since her chemotherapy she has an inability to sleep. She spoke recently with Dr. Laurie Panda recommended that we talked with her about appropriate sleeping aids.   No Known Allergies  Current Outpatient Prescriptions on File Prior to Visit  Medication Sig Dispense Refill  . cyanocobalamin (,VITAMIN B-12,) 1000 MCG/ML injection Inject 1 mL (1,000 mcg total) into the muscle every 30 (thirty) days.  10 mL  prn  . cyclobenzaprine (FLEXERIL) 10 MG tablet Take 10 mg by mouth 2 (two) times daily as needed. FOR SPASMS OR PAIN      . DULoxetine (CYMBALTA) 30 MG capsule Take 30 mg by mouth daily.        Marland Kitchen enoxaparin (LOVENOX) 80 MG/0.8ML SOLN Inject 0.7 mLs (70 mg total) into the skin every 12 (twelve) hours.  10 Syringe  2  . folic acid (FOLVITE) 1 MG tablet Take 1 mg by mouth daily.        . furosemide (LASIX) 40 MG tablet Take 20 mg by mouth daily.       Marland Kitchen HYDROcodone-acetaminophen (VICODIN) 5-500 MG per tablet Take 1 tablet by mouth as needed. For left chest pain (area where expander located for breast reconstruction)       . hydroxychloroquine (PLAQUENIL) 200 MG tablet Take 200 mg by mouth 2 (two) times daily.          . potassium chloride SA (K-DUR,KLOR-CON) 20 MEQ tablet Take 20 mEq by mouth daily.        . pregabalin (LYRICA) 100 MG capsule Take 100 mg by mouth 3 (three) times daily.        Marland Kitchen rOPINIRole (REQUIP) 0.5 MG tablet Take 0.5 mg by mouth at bedtime.        Marland Kitchen warfarin (COUMADIN) 5 MG tablet Taking 15mg  daily       . gabapentin (NEURONTIN) 600 MG tablet Take 600 mg by mouth 3 (three) times daily.         No current facility-administered medications on file prior to visit.    Past Medical History  Diagnosis Date  . Cancer     LEFT SIDE BREAST  . Lupus   . BRCA2 negative 08/16/2010  . BRCA1 negative 08/16/2010  . BREAST CANCER 02/24/2010  . 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 B12 deficiency 08/21/2010  . Mitral valve replaced   . Raynaud disease   . Fibromyalgia   . Anxiety   . CHF (congestive heart failure)   . Peripheral neuropathy     Past Surgical History  Procedure Date  . Mitral valve replacement   . Cholecystectomy   . Mastectomy   . Cardiac valve replacement   . Breast surgery   . Endometrial ablation 2008    She no  longer has menses  . Insertion expander left breast 11/16/10    No family history on file.  History   Social History  . Marital Status: Legally Separated    Spouse Name: N/A    Number of Children: N/A  . Years of Education: N/A   Occupational History  . Not on file.   Social History Main Topics  . Smoking status: Former Smoker -- 1.0 packs/day for 15 years    Types: Cigarettes    Quit date: 02/19/2006  . Smokeless tobacco: Never Used  . Alcohol Use: No  . Drug Use: No  . Sexually Active: No   Other Topics Concern  . Not on file   Social History Narrative  . No narrative on file   Review of systems: No palpitations. No chest pain shortness of breath orthopnea PND positive for insomnia. No bleeding or melena or hematochezia. No dysuria or frequency.  PHYSICAL EXAM BP 102/70  Pulse 82  Ht 5\' 4"  (1.626  m)  Wt 173 lb (78.472 kg)  BMI 29.70 kg/m2  General: Well-developed, well-nourished in no distress Head: Normocephalic and atraumatic Eyes:PERRLA/EOMI intact, conjunctiva and lids normal Ears: No deformity or lesions Mouth:normal dentition, normal posterior pharynx Neck: Supple, no JVD.  No masses, thyromegaly or abnormal cervical nodes Lungs: Normal breath sounds bilaterally without wheezing.  Normal percussion Cardiac: regular rate and rhythm with normal closing click of S1 and S2, no S3 or S4.  PMI is normal.  No pathological murmurs Abdomen: Normal bowel sounds, abdomen is soft and nontender without masses, organomegaly or hernias noted.  No hepatosplenomegaly MSK: Back normal, normal gait muscle strength and tone normal Vascular: Pulse is normal in all 4 extremities Extremities: No peripheral pitting edema Neurologic: Alert and oriented x 3 Skin: Intact without lesions or rashes Lymphatics: No significant adenopathy Psychologic: Normal affect  Limited bedside echocardiogram: Normal LV systolic function. Well-seated mitral valve prosthesis. Normal right heart function    ECG: Not available  ASSESSMENT AND PLAN

## 2010-12-19 NOTE — Progress Notes (Signed)
[name] presents today for injection per MD orders. Vit b12 administered SQ in right Upper Arm. Administration without incident. Patient tolerated well.

## 2010-12-19 NOTE — Patient Instructions (Signed)
Your physician wants you to follow-up in: 6 months. You will receive a reminder letter in the mail one-two months in advance. If you don't receive a letter, please call our office to schedule the follow-up appointment. Ambien 10 mg at bedtime for sleep.

## 2010-12-19 NOTE — Assessment & Plan Note (Signed)
No apparent valvular complications. There is normal physical examination of the mitral valve prosthesis. There is clinically no evidence to suggest thrombosis. She also reports no fever or chills. Limited bedside echo shows normal bileaflet mechanical prosthetic function. No rocking is noted.

## 2010-12-19 NOTE — Progress Notes (Signed)
This office note has been dictated.

## 2010-12-19 NOTE — Assessment & Plan Note (Signed)
Patient still on Lovenox but started Coumadin yesterday. She will be bridged off Lovenox.

## 2010-12-19 NOTE — Assessment & Plan Note (Signed)
Neuropathy related to chemotherapy.

## 2010-12-19 NOTE — Assessment & Plan Note (Signed)
I prescribed Ambien 10 mg by mouth each bedtime.

## 2010-12-19 NOTE — Patient Instructions (Signed)
[facility]  Discharge Instructions  RECOMMENDATIONS MADE BY THE CONSULTANT AND ANY TEST RESULTS WILL BE SENT TO YOUR REFERRING DOCTOR.   EXAM FINDINGS BY MD TODAY AND SIGNS AND SYMPTOMS TO REPORT TO CLINIC OR PRIMARY MD: exam good.  MEDICATIONS PRESCRIBED:  Vitamin b12 injection monthly  INSTRUCTIONS GIVEN AND DISCUSSED:  SPECIAL INSTRUCTIONS/FOLLOW-UP: 6 months I acknowledge that I have been informed and understand all the instructions given to me and received a copy. I do not have any more questions at this time, but understand that I may call the Specialty Clinic at [facility] at [phone] during business hours should I have any further questions or need assistance in obtaining follow-up care.    __________________________________________  _____________  __________ Signature of Patient or Authorized Representative            Date                   Time    __________________________________________ Nurse's Signature

## 2010-12-21 ENCOUNTER — Ambulatory Visit (INDEPENDENT_AMBULATORY_CARE_PROVIDER_SITE_OTHER): Payer: PRIVATE HEALTH INSURANCE | Admitting: *Deleted

## 2010-12-21 DIAGNOSIS — Z7901 Long term (current) use of anticoagulants: Secondary | ICD-10-CM

## 2010-12-21 DIAGNOSIS — Z954 Presence of other heart-valve replacement: Secondary | ICD-10-CM

## 2010-12-21 DIAGNOSIS — I059 Rheumatic mitral valve disease, unspecified: Secondary | ICD-10-CM

## 2010-12-21 LAB — POCT INR: INR: 3.1

## 2010-12-25 ENCOUNTER — Encounter: Payer: PRIVATE HEALTH INSURANCE | Admitting: *Deleted

## 2010-12-27 ENCOUNTER — Encounter: Payer: PRIVATE HEALTH INSURANCE | Admitting: *Deleted

## 2010-12-28 ENCOUNTER — Ambulatory Visit (INDEPENDENT_AMBULATORY_CARE_PROVIDER_SITE_OTHER): Payer: PRIVATE HEALTH INSURANCE | Admitting: *Deleted

## 2010-12-28 DIAGNOSIS — I059 Rheumatic mitral valve disease, unspecified: Secondary | ICD-10-CM

## 2010-12-28 DIAGNOSIS — Z7901 Long term (current) use of anticoagulants: Secondary | ICD-10-CM

## 2010-12-28 DIAGNOSIS — Z954 Presence of other heart-valve replacement: Secondary | ICD-10-CM

## 2011-01-01 ENCOUNTER — Ambulatory Visit (HOSPITAL_COMMUNITY)
Admission: RE | Admit: 2011-01-01 | Discharge: 2011-01-01 | Disposition: A | Discharge status: Home / Self Care | Payer: PRIVATE HEALTH INSURANCE | Source: Ambulatory Visit | Attending: Oncology | Admitting: Oncology

## 2011-01-01 DIAGNOSIS — Z9010 Acquired absence of unspecified breast and nipple: Secondary | ICD-10-CM | POA: Insufficient documentation

## 2011-01-01 DIAGNOSIS — Z139 Encounter for screening, unspecified: Secondary | ICD-10-CM

## 2011-01-01 DIAGNOSIS — Z1231 Encounter for screening mammogram for malignant neoplasm of breast: Secondary | ICD-10-CM | POA: Insufficient documentation

## 2011-01-03 ENCOUNTER — Ambulatory Visit (INDEPENDENT_AMBULATORY_CARE_PROVIDER_SITE_OTHER): Payer: PRIVATE HEALTH INSURANCE | Admitting: *Deleted

## 2011-01-03 ENCOUNTER — Encounter: Payer: PRIVATE HEALTH INSURANCE | Admitting: *Deleted

## 2011-01-03 DIAGNOSIS — Z7901 Long term (current) use of anticoagulants: Secondary | ICD-10-CM

## 2011-01-03 DIAGNOSIS — Z954 Presence of other heart-valve replacement: Secondary | ICD-10-CM

## 2011-01-03 DIAGNOSIS — I059 Rheumatic mitral valve disease, unspecified: Secondary | ICD-10-CM

## 2011-01-03 LAB — POCT INR: INR: 2.9

## 2011-01-10 ENCOUNTER — Encounter: Payer: PRIVATE HEALTH INSURANCE | Admitting: *Deleted

## 2011-01-15 ENCOUNTER — Ambulatory Visit (INDEPENDENT_AMBULATORY_CARE_PROVIDER_SITE_OTHER): Payer: PRIVATE HEALTH INSURANCE | Admitting: *Deleted

## 2011-01-15 DIAGNOSIS — Z954 Presence of other heart-valve replacement: Secondary | ICD-10-CM

## 2011-01-15 DIAGNOSIS — I059 Rheumatic mitral valve disease, unspecified: Secondary | ICD-10-CM

## 2011-01-15 DIAGNOSIS — Z7901 Long term (current) use of anticoagulants: Secondary | ICD-10-CM

## 2011-01-15 LAB — POCT INR: INR: 2.4

## 2011-01-15 NOTE — Progress Notes (Signed)
CC:   Sandria Bales. Ezzard Standing, M.D. Zenovia Jordan, MD Learta Codding, MD,FACC Corrie Mckusick, M.D. Billie Lade, Ph.D., M.D. Etter Sjogren, M.D.  DIAGNOSES: 1. 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  No obvious     adenopathy in the axilla or supraclavicular or infraclavicular     areas.  She took 2 cycles of FEC then had a mastectomy 04/20/2010     which showed no evidence of disease in the lymph nodes but still     had a grade 3 cancer that was 1.6 cm in size.  Margins were clear.     She was ER negative, PR negative, HER-2/neu negative, Ki-67 marker     was 92%.  LVI was not identified.  She then took Taxotere and     carboplatin for 4 cycles.  The chemotherapy was then felt to be     completed after 6 cycles of total therapy.  We did not go back to     [facility] due to her difficulty tolerating it. 2. Systemic lupus erythematosus on Plaquenil. 3. 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 4. Septic episodes after cycles 1 and 2 as well from FEC. 5. Cholecystectomy in the recent past. 6. Clostridium difficile diarrhea in the past. 7. Possible viral-induced pancreatitis prompting admission the     hospital for quick resolution after mastectomy. 8. BRCA1 and BRCA2 negativity. 9. Mitral valve replaced with a St. Jude valve several years ago done     at [facility] after a history of congestive heart failure and she is now     on anticoagulation still with Lovenox injections. 10.Hypothyroidism. 11.Mild obesity. 12.Left breast partial reconstruction.  It will be complete by the end     of December, she states, by Dr. Etter Sjogren.  REVIEW OF SYSTEMS:  Lynise's review of systems is really quite negative. She is doing well on all fronts including her lupus.  She is not aware of any problems with reconstruction yet.  She has no lumps or bumps anywhere.  PHYSICAL EXAMINATION:  Her vital signs are all very  stable except her weight is up from 162 pounds in July to 173 pounds.  Other vital signs are fine.  She is in no acute distress.  She is alert.  She is very oriented.  The facial rash is certainly gone at this time.  She has no adenopathy in the cervical, supraclavicular, infraclavicular, axillary, or inguinal areas.  She has no obvious hepatosplenomegaly.  Her abdominal wall has lots of lumps and bumps due to the Lovenox injections.  Her left reconstructed breast shows no skin changes.  The right breast is negative.  Her lungs are clear to auscultation and percussion.  Heart shows a click on a regular basis.  I did not hear a distinct murmur at this time.  So she looks really quite good.  We will see her back in 6 months.  I did not do any blood work on her since she had some recently in September.    ______________________________ Ladona Horns. Mariel Sleet, MD ESN/MEDQ  D:  12/19/2010  T:  12/20/2010  Job:  [id number]

## 2011-01-29 ENCOUNTER — Ambulatory Visit (INDEPENDENT_AMBULATORY_CARE_PROVIDER_SITE_OTHER): Payer: PRIVATE HEALTH INSURANCE | Admitting: *Deleted

## 2011-01-29 DIAGNOSIS — Z954 Presence of other heart-valve replacement: Secondary | ICD-10-CM

## 2011-01-29 DIAGNOSIS — Z7901 Long term (current) use of anticoagulants: Secondary | ICD-10-CM

## 2011-01-29 DIAGNOSIS — I059 Rheumatic mitral valve disease, unspecified: Secondary | ICD-10-CM

## 2011-01-29 NOTE — Patient Instructions (Signed)
Scheduled for surgery 02/14/11 12/20  Last dose of coumadin 12/21  No Lovenox or coumadin 12/22 - 12/24  Lovenox 80mg  SQ BID 12/25  Lovenox 80mg  AM only 12/26  No lovenox   Surgery 12/27 - 12/30  Lovenox 80mg  BID and Coumadin 10mg  q pm 12/31  Lovenox 80mg  am   INR check 11:50am

## 2011-02-01 ENCOUNTER — Encounter (HOSPITAL_COMMUNITY): Payer: Self-pay

## 2011-02-05 ENCOUNTER — Encounter (HOSPITAL_COMMUNITY)
Admission: RE | Admit: 2011-02-05 | Discharge: 2011-02-05 | Disposition: A | Discharge status: Home / Self Care | Payer: PRIVATE HEALTH INSURANCE | Source: Ambulatory Visit | Attending: Plastic Surgery | Admitting: Plastic Surgery

## 2011-02-05 ENCOUNTER — Encounter (HOSPITAL_COMMUNITY): Payer: Self-pay

## 2011-02-05 LAB — BASIC METABOLIC PANEL
CO2: 27 mEq/L (ref 19–32)
Calcium: 9.5 mg/dL (ref 8.4–10.5)
Chloride: 105 mEq/L (ref 96–112)
Glucose, Bld: 66 mg/dL — ABNORMAL LOW (ref 70–99)
Potassium: 3.8 mEq/L (ref 3.5–5.1)
Sodium: 139 mEq/L (ref 135–145)

## 2011-02-05 LAB — CBC
Hemoglobin: 11.1 g/dL — ABNORMAL LOW (ref 12.0–15.0)
MCH: 28.7 pg (ref 26.0–34.0)
Platelets: 141 10*3/uL — ABNORMAL LOW (ref 150–400)
RBC: 3.87 MIL/uL (ref 3.87–5.11)
WBC: 7.1 10*3/uL (ref 4.0–10.5)

## 2011-02-05 LAB — SURGICAL PCR SCREEN
MRSA, PCR: NEGATIVE
Staphylococcus aureus: NEGATIVE

## 2011-02-05 LAB — APTT: aPTT: 43 seconds — ABNORMAL HIGH (ref 24–37)

## 2011-02-05 LAB — PROTIME-INR: Prothrombin Time: 29.4 seconds — ABNORMAL HIGH (ref 11.6–15.2)

## 2011-02-05 NOTE — Pre-Procedure Instructions (Addendum)
20 [name]  02/05/2011   Your procedure is scheduled on:  02/14/11  Report to [facility] at 630 AM.  Call this number if you have problems the morning of surgery: [phone]   Remember:   Do not eat food:After Midnight.  May have clear liquids: up to 4 Hours before arrival.  Clear liquids include soda, tea, black coffee, apple or grape juice, broth.  Take these medicines the morning of surgery with A SIP OF WATER: NEUROTIN,VICODAN,PROTONIX   Do not wear jewelry, make-up or nail polish.  Do not wear lotions, powders, or perfumes. You may wear deodorant.  Do not shave 48 hours prior to surgery.  Do not bring valuables to the hospital.  Contacts, dentures or bridgework may not be worn into surgery.  Leave suitcase in the car. After surgery it may be brought to your room.  For patients admitted to the hospital, checkout time is 11:00 AM the day of discharge.   Patients discharged the day of surgery will not be allowed to drive home.  Name and phone number of your driver: FAMILY  Special Instructions: SHOWER 1/2 BOTTLE HIBCLENS NIGHT BEFORE,AND 1/2 BOTTLE AM OF SURGERY.   Please read over the following fact sheets that you were given: Coughing and Deep Breathing, MRSA Information and Surgical Site Infection Prevention

## 2011-02-05 NOTE — Progress Notes (Signed)
Cxr,echo in epic Will call dr degent for ekg  ( one in epic also)

## 2011-02-06 ENCOUNTER — Other Ambulatory Visit: Payer: Self-pay | Admitting: Cardiology

## 2011-02-06 NOTE — Consult Note (Signed)
Anesthesia:  Patient is a 40 year old female scheduled for right breast reduction, left breast removal of tissue expander and placement of implant on 02/14/11.  She completed chemotherapy in July of 2012.  Her initial left mastectomy was in March and reconstruction in September of this year.  Her history includes SLE, B12 deficiency, Raynaud's Syndrome, fibromyalgia, former smoker, anxiety, and hx of MV Replacement (St. Jude).  Her last echo (see Results Review tab) was on 02/10/10 and showed: - Left ventricle: The cavity size was normal. Wall thickness was increased in a pattern of mild LVH. The estimated ejection fraction was 60%. Wall motion was normal; there were no regional wall motion abnormalities. - Mitral valve: The St. Jude mechanical mitral prosthesis is workingwell. - Left atrium: The atrium was moderately dilated. - Pulmonary arteries: PA peak pressure: 40mm Hg (S). - Pericardium, extracardiac: There was no pericardial effusion.  Her last EKG was on 02/10/10 and showed SB with anterior T wave inversion.  She will need a new EKG on the day of surgery since it will be greater than 28 year old.  CXR on 09/06/10 showed no acute process.  Her labs are noted.  She is on Coumadin with plans for a Lovenox bridge secondary to her MVR.  I have ordered a repeat PT/PTT for the day of surgery.

## 2011-02-06 NOTE — Anesthesia Preprocedure Evaluation (Addendum)
Anesthesia Evaluation  Patient identified by MRN, date of birth, ID band Patient awake    Reviewed: Allergy & Precautions, H&P , NPO status , Patient's Chart, lab work & pertinent test results  History of Anesthesia Complications (+) PONV  Airway Mallampati: I TM Distance: >3 FB Neck ROM: Full    Dental  (+) Teeth Intact   Pulmonary  clear to auscultation        Cardiovascular +CHF + Valvular Problems/Murmurs MR Regular Normal+ Systolic Click 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)   Neuro/Psych Anxiety  Neuromuscular disease    GI/Hepatic Neg liver ROS, GERD-  Medicated,  Endo/Other  Negative Endocrine ROS  Renal/GU negative Renal ROS  Genitourinary negative   Musculoskeletal  (+) Fibromyalgia -  Abdominal (+) obese,   Peds negative pediatric ROS (+)  Hematology negative hematology ROS (+)   Anesthesia Other Findings   Reproductive/Obstetrics negative OB ROS                       Anesthesia Physical Anesthesia Plan  ASA: III  Anesthesia Plan: General   Post-op Pain Management:    Induction: Intravenous  Airway Management Planned: Oral ETT  Additional Equipment:   Intra-op Plan:   Post-operative Plan: Extubation in OR  Informed Consent: I have reviewed the patients History and Physical, chart, labs and discussed the procedure including the risks, benefits and alternatives for the proposed anesthesia with the patient or authorized representative who has indicated his/her understanding and acceptance.   Dental advisory given  Plan Discussed with: CRNA and Surgeon  Anesthesia Plan Comments:         Anesthesia Quick Evaluation

## 2011-02-06 NOTE — Progress Notes (Signed)
Had not stopped coumadin at PAT.  She has instruction from md

## 2011-02-06 NOTE — Progress Notes (Signed)
Will have anesthesia review lab results.  PT Pt has lovanox  sq instruction from md .

## 2011-02-09 ENCOUNTER — Other Ambulatory Visit: Payer: Self-pay | Admitting: Cardiology

## 2011-02-14 ENCOUNTER — Encounter (HOSPITAL_COMMUNITY)
Admission: RE | Disposition: A | Discharge status: Home / Self Care | Payer: Self-pay | Source: Ambulatory Visit | Attending: Plastic Surgery

## 2011-02-14 ENCOUNTER — Encounter (HOSPITAL_COMMUNITY): Payer: Self-pay | Admitting: Vascular Surgery

## 2011-02-14 ENCOUNTER — Other Ambulatory Visit: Payer: Self-pay

## 2011-02-14 ENCOUNTER — Ambulatory Visit (HOSPITAL_COMMUNITY): Payer: PRIVATE HEALTH INSURANCE | Admitting: Vascular Surgery

## 2011-02-14 ENCOUNTER — Other Ambulatory Visit: Payer: Self-pay | Admitting: Plastic Surgery

## 2011-02-14 ENCOUNTER — Encounter (HOSPITAL_COMMUNITY): Payer: Self-pay

## 2011-02-14 ENCOUNTER — Ambulatory Visit (HOSPITAL_COMMUNITY)
Admission: RE | Admit: 2011-02-14 | Discharge: 2011-02-17 | DRG: 583 | Disposition: A | Discharge status: Home / Self Care | Payer: PRIVATE HEALTH INSURANCE | Source: Ambulatory Visit | Attending: Plastic Surgery | Admitting: Plastic Surgery

## 2011-02-14 DIAGNOSIS — M329 Systemic lupus erythematosus, unspecified: Secondary | ICD-10-CM | POA: Insufficient documentation

## 2011-02-14 DIAGNOSIS — F411 Generalized anxiety disorder: Secondary | ICD-10-CM | POA: Insufficient documentation

## 2011-02-14 DIAGNOSIS — Z7901 Long term (current) use of anticoagulants: Secondary | ICD-10-CM | POA: Insufficient documentation

## 2011-02-14 DIAGNOSIS — E039 Hypothyroidism, unspecified: Secondary | ICD-10-CM | POA: Insufficient documentation

## 2011-02-14 DIAGNOSIS — H5710 Ocular pain, unspecified eye: Secondary | ICD-10-CM | POA: Insufficient documentation

## 2011-02-14 DIAGNOSIS — Z853 Personal history of malignant neoplasm of breast: Secondary | ICD-10-CM | POA: Insufficient documentation

## 2011-02-14 DIAGNOSIS — Z954 Presence of other heart-valve replacement: Secondary | ICD-10-CM | POA: Insufficient documentation

## 2011-02-14 DIAGNOSIS — E669 Obesity, unspecified: Secondary | ICD-10-CM | POA: Insufficient documentation

## 2011-02-14 DIAGNOSIS — K219 Gastro-esophageal reflux disease without esophagitis: Secondary | ICD-10-CM | POA: Insufficient documentation

## 2011-02-14 DIAGNOSIS — Z421 Encounter for breast reconstruction following mastectomy: Secondary | ICD-10-CM | POA: Insufficient documentation

## 2011-02-14 DIAGNOSIS — IMO0001 Reserved for inherently not codable concepts without codable children: Secondary | ICD-10-CM | POA: Insufficient documentation

## 2011-02-14 DIAGNOSIS — D591 Autoimmune hemolytic anemia, unspecified: Secondary | ICD-10-CM | POA: Insufficient documentation

## 2011-02-14 DIAGNOSIS — I509 Heart failure, unspecified: Secondary | ICD-10-CM | POA: Insufficient documentation

## 2011-02-14 DIAGNOSIS — N651 Disproportion of reconstructed breast: Secondary | ICD-10-CM | POA: Insufficient documentation

## 2011-02-14 HISTORY — PX: MASTOPEXY: SHX5358

## 2011-02-14 HISTORY — PX: TISSUE EXPANDER PLACEMENT: SHX2530

## 2011-02-14 LAB — APTT: aPTT: 37 seconds (ref 24–37)

## 2011-02-14 SURGERY — INSERTION, TISSUE EXPANDER
Anesthesia: General | Laterality: Bilateral | Wound class: Clean

## 2011-02-14 MED ORDER — GABAPENTIN 600 MG PO TABS
600.0000 mg | ORAL_TABLET | Freq: Three times a day (TID) | ORAL | Status: DC
  Administered 2011-02-14 – 2011-02-17 (×8): 600 mg via ORAL
  Filled 2011-02-14 (×11): qty 1

## 2011-02-14 MED ORDER — ROPINIROLE HCL 1 MG PO TABS
1.0000 mg | ORAL_TABLET | Freq: Every day | ORAL | Status: DC
  Administered 2011-02-14 – 2011-02-16 (×2): 1 mg via ORAL
  Filled 2011-02-14 (×5): qty 1

## 2011-02-14 MED ORDER — ONDANSETRON HCL 4 MG/2ML IJ SOLN
INTRAMUSCULAR | Status: DC | PRN
  Administered 2011-02-14: 4 mg via INTRAVENOUS

## 2011-02-14 MED ORDER — NEOSTIGMINE METHYLSULFATE 1 MG/ML IJ SOLN
INTRAMUSCULAR | Status: DC | PRN
  Administered 2011-02-14: 4 mg via INTRAVENOUS

## 2011-02-14 MED ORDER — ONDANSETRON HCL 4 MG/2ML IJ SOLN: 4.0000 mg | Freq: Four times a day (QID) | INTRAMUSCULAR | Status: DC | PRN

## 2011-02-14 MED ORDER — ACETAMINOPHEN 10 MG/ML IV SOLN
INTRAVENOUS | Status: AC
  Filled 2011-02-14: qty 100

## 2011-02-14 MED ORDER — CEFAZOLIN SODIUM 1-5 GM-% IV SOLN
INTRAVENOUS | Status: AC
  Administered 2011-02-14: 1 g via INTRAVENOUS
  Filled 2011-02-14: qty 50

## 2011-02-14 MED ORDER — GLYCOPYRROLATE 0.2 MG/ML IJ SOLN
INTRAMUSCULAR | Status: DC | PRN
  Administered 2011-02-14: .5 mg via INTRAVENOUS

## 2011-02-14 MED ORDER — NALOXONE HCL 0.4 MG/ML IJ SOLN
0.4000 mg | INTRAMUSCULAR | Status: DC | PRN
Start: 2011-02-14 — End: 2011-02-15

## 2011-02-14 MED ORDER — NALOXONE HCL 0.4 MG/ML IJ SOLN: 0.4000 mg | INTRAMUSCULAR | Status: DC | PRN

## 2011-02-14 MED ORDER — LACTATED RINGERS IV SOLN: INTRAVENOUS | Status: DC

## 2011-02-14 MED ORDER — POTASSIUM CHLORIDE CRYS ER 20 MEQ PO TBCR
20.0000 meq | EXTENDED_RELEASE_TABLET | Freq: Every day | ORAL | Status: DC
  Administered 2011-02-14 – 2011-02-17 (×4): 20 meq via ORAL
  Filled 2011-02-14 (×4): qty 1

## 2011-02-14 MED ORDER — DIPHENHYDRAMINE HCL 12.5 MG/5ML PO ELIX
12.5000 mg | ORAL_SOLUTION | Freq: Four times a day (QID) | ORAL | Status: DC | PRN
  Filled 2011-02-14: qty 5

## 2011-02-14 MED ORDER — MIDAZOLAM HCL 5 MG/5ML IJ SOLN
INTRAMUSCULAR | Status: DC | PRN
  Administered 2011-02-14: 2 mg via INTRAVENOUS

## 2011-02-14 MED ORDER — VECURONIUM BROMIDE 10 MG IV SOLR
INTRAVENOUS | Status: DC | PRN
  Administered 2011-02-14: 2 mg via INTRAVENOUS

## 2011-02-14 MED ORDER — DIPHENHYDRAMINE HCL 50 MG/ML IJ SOLN: 12.5000 mg | Freq: Four times a day (QID) | INTRAMUSCULAR | Status: DC | PRN

## 2011-02-14 MED ORDER — FENTANYL CITRATE 0.05 MG/ML IJ SOLN
INTRAMUSCULAR | Status: DC | PRN
  Administered 2011-02-14: 25 ug via INTRAVENOUS
  Administered 2011-02-14 (×2): 50 ug via INTRAVENOUS
  Administered 2011-02-14 (×2): 25 ug via INTRAVENOUS
  Administered 2011-02-14: 50 ug via INTRAVENOUS
  Administered 2011-02-14: 150 ug via INTRAVENOUS
  Administered 2011-02-14 (×2): 50 ug via INTRAVENOUS
  Administered 2011-02-14: 25 ug via INTRAVENOUS

## 2011-02-14 MED ORDER — ACETAMINOPHEN 10 MG/ML IV SOLN
INTRAVENOUS | Status: DC | PRN
  Administered 2011-02-14: 1000 mg via INTRAVENOUS

## 2011-02-14 MED ORDER — ROCURONIUM BROMIDE 100 MG/10ML IV SOLN
INTRAVENOUS | Status: DC | PRN
  Administered 2011-02-14: 50 mg via INTRAVENOUS

## 2011-02-14 MED ORDER — HYPROMELLOSE (GONIOSCOPIC) 2.5 % OP SOLN: 2.0000 [drp] | OPHTHALMIC | Status: DC | PRN

## 2011-02-14 MED ORDER — LACTATED RINGERS IV SOLN
INTRAVENOUS | Status: DC | PRN
  Administered 2011-02-14 (×2): via INTRAVENOUS

## 2011-02-14 MED ORDER — HYDROMORPHONE 0.3 MG/ML IV SOLN
INTRAVENOUS | Status: DC
  Administered 2011-02-14: 12:00:00 via INTRAVENOUS

## 2011-02-14 MED ORDER — PROPOFOL 10 MG/ML IV EMUL
INTRAVENOUS | Status: DC | PRN
  Administered 2011-02-14: 100 mg via INTRAVENOUS

## 2011-02-14 MED ORDER — PROMETHAZINE HCL 25 MG/ML IJ SOLN
12.5000 mg | INTRAMUSCULAR | Status: DC | PRN
  Administered 2011-02-14 – 2011-02-15 (×2): 12.5 mg via INTRAVENOUS
  Filled 2011-02-14 (×2): qty 1

## 2011-02-14 MED ORDER — PANTOPRAZOLE SODIUM 40 MG PO TBEC
40.0000 mg | DELAYED_RELEASE_TABLET | Freq: Every day | ORAL | Status: DC
  Administered 2011-02-14 – 2011-02-17 (×4): 40 mg via ORAL
  Filled 2011-02-14 (×4): qty 1

## 2011-02-14 MED ORDER — FUROSEMIDE 20 MG PO TABS
20.0000 mg | ORAL_TABLET | Freq: Every day | ORAL | Status: DC
  Administered 2011-02-14 – 2011-02-17 (×4): 20 mg via ORAL
  Filled 2011-02-14 (×4): qty 1

## 2011-02-14 MED ORDER — DOCUSATE SODIUM 100 MG PO CAPS
100.0000 mg | ORAL_CAPSULE | Freq: Every day | ORAL | Status: DC
  Administered 2011-02-14 – 2011-02-17 (×4): 100 mg via ORAL
  Filled 2011-02-14 (×4): qty 1

## 2011-02-14 MED ORDER — PROMETHAZINE HCL 25 MG/ML IJ SOLN
6.2500 mg | INTRAMUSCULAR | Status: DC | PRN
  Administered 2011-02-14: 6.25 mg via INTRAVENOUS

## 2011-02-14 MED ORDER — PROMETHAZINE HCL 25 MG/ML IJ SOLN
6.2500 mg | INTRAMUSCULAR | Status: DC | PRN
  Administered 2011-02-14: 6.25 mg via INTRAVENOUS
  Filled 2011-02-14: qty 1

## 2011-02-14 MED ORDER — HYDROXYCHLOROQUINE SULFATE 200 MG PO TABS
200.0000 mg | ORAL_TABLET | Freq: Two times a day (BID) | ORAL | Status: DC
  Administered 2011-02-14 – 2011-02-17 (×5): 200 mg via ORAL
  Filled 2011-02-14 (×7): qty 1

## 2011-02-14 MED ORDER — ENOXAPARIN SODIUM 80 MG/0.8ML ~~LOC~~ SOLN
70.0000 mg | Freq: Two times a day (BID) | SUBCUTANEOUS | Status: DC
  Administered 2011-02-15 – 2011-02-17 (×4): 70 mg via SUBCUTANEOUS
  Filled 2011-02-14 (×6): qty 0.8

## 2011-02-14 MED ORDER — CEFAZOLIN SODIUM 1-5 GM-% IV SOLN
1.0000 g | Freq: Three times a day (TID) | INTRAVENOUS | Status: DC
  Administered 2011-02-14 – 2011-02-17 (×9): 1 g via INTRAVENOUS
  Filled 2011-02-14 (×14): qty 50

## 2011-02-14 MED ORDER — SODIUM CHLORIDE 0.9 % IR SOLN
Status: DC | PRN
  Administered 2011-02-14: 1000 mL

## 2011-02-14 MED ORDER — SODIUM CHLORIDE 0.9 % IJ SOLN: 9.0000 mL | INTRAMUSCULAR | Status: DC | PRN

## 2011-02-14 MED ORDER — METHOCARBAMOL 500 MG PO TABS
500.0000 mg | ORAL_TABLET | Freq: Four times a day (QID) | ORAL | Status: DC | PRN
  Filled 2011-02-14: qty 1

## 2011-02-14 MED ORDER — HYDROMORPHONE HCL PF 1 MG/ML IJ SOLN
0.2500 mg | INTRAMUSCULAR | Status: DC | PRN
  Administered 2011-02-14 (×2): 0.5 mg via INTRAVENOUS

## 2011-02-14 MED ORDER — SODIUM CHLORIDE 0.9 % IR SOLN
Status: DC | PRN
  Administered 2011-02-14: 09:00:00

## 2011-02-14 MED ORDER — MEPERIDINE HCL 25 MG/ML IJ SOLN: 6.2500 mg | INTRAMUSCULAR | Status: DC | PRN

## 2011-02-14 MED ORDER — POLYVINYL ALCOHOL 1.4 % OP SOLN
2.0000 [drp] | OPHTHALMIC | Status: DC | PRN
  Filled 2011-02-14: qty 15

## 2011-02-14 MED ORDER — HYDROMORPHONE 0.3 MG/ML IV SOLN
INTRAVENOUS | Status: DC
  Administered 2011-02-14: 1.39 mg via INTRAVENOUS
  Administered 2011-02-14: 0.799 mg via INTRAVENOUS
  Administered 2011-02-15: 0.199 mg via INTRAVENOUS
  Administered 2011-02-15: 0.8 mg via INTRAVENOUS

## 2011-02-14 MED ORDER — ZOLPIDEM TARTRATE 5 MG PO TABS: 5.0000 mg | ORAL_TABLET | Freq: Every evening | ORAL | Status: DC | PRN

## 2011-02-14 MED ORDER — DEXAMETHASONE SODIUM PHOSPHATE 4 MG/ML IJ SOLN
INTRAMUSCULAR | Status: DC | PRN
  Administered 2011-02-14: 8 mg via INTRAVENOUS

## 2011-02-14 SURGICAL SUPPLY — 69 items
ADH SKN CLS APL DERMABOND .7 (GAUZE/BANDAGES/DRESSINGS) ×1
APPLIER CLIP 9.375 MED OPEN (MISCELLANEOUS) ×2
APR CLP MED 9.3 20 MLT OPN (MISCELLANEOUS) ×1
ATCH SMKEVC FLXB CAUT HNDSWH (FILTER) ×1 IMPLANT
BAG DECANTER FOR FLEXI CONT (MISCELLANEOUS) ×2 IMPLANT
BALL CTTN LRG ABS STRL LF (GAUZE/BANDAGES/DRESSINGS) ×1
BIOPATCH RED 1 DISK 7.0 (GAUZE/BANDAGES/DRESSINGS) ×2 IMPLANT
BLADE SURG 15 STRL LF DISP TIS (BLADE) IMPLANT
BLADE SURG 15 STRL SS (BLADE)
BNDG CMPR MED 15X6 ELC VLCR LF (GAUZE/BANDAGES/DRESSINGS) ×1
BNDG ELASTIC 6X15 VLCR STRL LF (GAUZE/BANDAGES/DRESSINGS) ×1 IMPLANT
CANISTER SUCTION 2500CC (MISCELLANEOUS) ×2 IMPLANT
CHLORAPREP W/TINT 26ML (MISCELLANEOUS) ×2 IMPLANT
CLIP APPLIE 9.375 MED OPEN (MISCELLANEOUS) ×1 IMPLANT
CLOSURE STERI STRIP 1/2 X4 (GAUZE/BANDAGES/DRESSINGS) ×1 IMPLANT
CLOTH BEACON ORANGE TIMEOUT ST (SAFETY) ×2 IMPLANT
COTTONBALL LRG STERILE PKG (GAUZE/BANDAGES/DRESSINGS) ×3 IMPLANT
COVER SURGICAL LIGHT HANDLE (MISCELLANEOUS) ×2 IMPLANT
DERMABOND ADVANCED (GAUZE/BANDAGES/DRESSINGS) ×1
DERMABOND ADVANCED .7 DNX12 (GAUZE/BANDAGES/DRESSINGS) ×1 IMPLANT
DRAIN CHANNEL 19F RND (DRAIN) ×3 IMPLANT
DRAPE LAPAROSCOPIC ABDOMINAL (DRAPES) ×3 IMPLANT
DRAPE ORTHO SPLIT 77X108 STRL (DRAPES) ×4
DRAPE PROXIMA HALF (DRAPES) ×4 IMPLANT
DRAPE SURG 17X23 STRL (DRAPES) ×8 IMPLANT
DRAPE SURG ORHT 6 SPLT 77X108 (DRAPES) ×2 IMPLANT
DRAPE WARM FLUID 44X44 (DRAPE) ×2 IMPLANT
DRESSING TELFA 8X3 (GAUZE/BANDAGES/DRESSINGS) ×2 IMPLANT
DRSG PAD ABDOMINAL 8X10 ST (GAUZE/BANDAGES/DRESSINGS) ×8 IMPLANT
ELECT BLADE 6.5 EXT (BLADE) ×2 IMPLANT
ELECT CAUTERY BLADE 6.4 (BLADE) ×2 IMPLANT
ELECT REM PT RETURN 9FT ADLT (ELECTROSURGICAL) ×2
ELECTRODE REM PT RTRN 9FT ADLT (ELECTROSURGICAL) ×1 IMPLANT
EVACUATOR SILICONE 100CC (DRAIN) ×2 IMPLANT
EVACUATOR SMOKE ACCUVAC VALLEY (FILTER) ×1
GAUZE XEROFORM 5X9 LF (GAUZE/BANDAGES/DRESSINGS) ×4 IMPLANT
GLOVE BIO SURGEON STRL SZ7.5 (GLOVE) ×2 IMPLANT
GLOVE BIO SURGEON STRL SZ8 (GLOVE) ×2 IMPLANT
GLOVE BIOGEL PI IND STRL 8 (GLOVE) ×1 IMPLANT
GLOVE BIOGEL PI INDICATOR 8 (GLOVE) ×1
GLOVE SURG SS PI 6.5 STRL IVOR (GLOVE) ×2 IMPLANT
GOWN PREVENTION PLUS XLARGE (GOWN DISPOSABLE) ×2 IMPLANT
GOWN STRL NON-REIN LRG LVL3 (GOWN DISPOSABLE) ×5 IMPLANT
KIT BASIN OR (CUSTOM PROCEDURE TRAY) ×2 IMPLANT
KIT ROOM TURNOVER OR (KITS) ×2 IMPLANT
NS IRRIG 1000ML POUR BTL (IV SOLUTION) ×5 IMPLANT
PACK GENERAL/GYN (CUSTOM PROCEDURE TRAY) ×2 IMPLANT
PAD ARMBOARD 7.5X6 YLW CONV (MISCELLANEOUS) ×4 IMPLANT
PEN SKIN MARKING BROAD (MISCELLANEOUS) ×2 IMPLANT
PREFILTER EVAC NS 1 1/3-3/8IN (MISCELLANEOUS) ×1 IMPLANT
SPECIMEN JAR LARGE (MISCELLANEOUS) ×2 IMPLANT
SPONGE GAUZE 4X4 12PLY (GAUZE/BANDAGES/DRESSINGS) ×4 IMPLANT
SPONGE LAP 18X18 X RAY DECT (DISPOSABLE) ×2 IMPLANT
STAPLER VISISTAT 35W (STAPLE) ×1 IMPLANT
STRIP CLOSURE SKIN 1/2X4 (GAUZE/BANDAGES/DRESSINGS) ×4 IMPLANT
SUT MNCRL AB 3-0 PS2 18 (SUTURE) ×8 IMPLANT
SUT MNCRL AB 4-0 PS2 18 (SUTURE) ×4 IMPLANT
SUT PDS AB 3-0 SH 27 (SUTURE) IMPLANT
SUT PROLENE 3 0 PS 2 (SUTURE) ×4 IMPLANT
SUT PROLENE 5 0 P 3 (SUTURE) ×4 IMPLANT
SUT VIC AB 2-0 FS1 27 (SUTURE) ×3 IMPLANT
SUT VIC AB 3-0 SH 18 (SUTURE) ×2 IMPLANT
Siltex (Breast) ×1 IMPLANT
TOWEL OR 17X24 6PK STRL BLUE (TOWEL DISPOSABLE) ×2 IMPLANT
TOWEL OR 17X26 10 PK STRL BLUE (TOWEL DISPOSABLE) ×4 IMPLANT
TRAY FOLEY CATH 14FRSI W/METER (CATHETERS) IMPLANT
TUBE CONNECTING 12X1/4 (SUCTIONS) ×2 IMPLANT
TUBING 1/4  WITH 7/8  ADAPTER (MISCELLANEOUS) ×1 IMPLANT
WATER STERILE IRR 1000ML POUR (IV SOLUTION) ×2 IMPLANT

## 2011-02-14 NOTE — Anesthesia Postprocedure Evaluation (Signed)
  Anesthesia Post-op Note  Patient: [name]  Procedure(s) Performed:  TISSUE EXPANDER - Left Breast Remove Tissue Expander Placement of Implant Breast Reconstruction; MASTOPEXY - Right Breast Reduction   Patient Location: PACU  Anesthesia Type: General  Level of Consciousness: awake, alert  and oriented  Airway and Oxygen Therapy: Patient Spontanous Breathing and Patient connected to nasal cannula oxygen  Post-op Pain: mild  Post-op Assessment: Post-op Vital signs reviewed, Respiratory Function Stable, Patent Airway, No signs of Nausea or vomiting and Pain level controlled  Post-op Vital Signs: Reviewed and stable  Complications: R eye is burning.  Dr. Jacklynn Bue and I examined her and felt there was no foreign body.  Possible mild corneal abrasion.  Will order artificial tears for 24 hours and assess at follow up visit.

## 2011-02-14 NOTE — Progress Notes (Signed)
Pt requests right eye be rinsed with NS again, stating that her eye is burning.  Dilaudid 0.5 mg IVP given and right eye rinsed with NS.   Pt then requests "something" be put over her eye.  2x2 gauze pad placed over eye and taped.  Will continue to monitor.

## 2011-02-14 NOTE — Transfer of Care (Signed)
Immediate Anesthesia Transfer of Care Note  Patient: [name]  Procedure(s) Performed:  TISSUE EXPANDER - Left Breast Remove Tissue Expander Placement of Implant Breast Reconstruction; MASTOPEXY - Right Breast Reduction   Patient Location: PACU  Anesthesia Type: General  Level of Consciousness: awake, oriented, sedated and patient cooperative  Airway & Oxygen Therapy: Patient Spontanous Breathing and Patient connected to nasal cannula oxygen  Post-op Assessment: Report given to PACU RN, Post -op Vital signs reviewed and stable, Patient moving all extremities and Patient moving all extremities X 4  Post vital signs: Reviewed and stable  Complications: No apparent anesthesia complications

## 2011-02-14 NOTE — Progress Notes (Signed)
Pt complains of "burning" in her right eye and rubs her eye often.  Encouraged pt to not rub eye as it could worsen her pain.  Rinsed right eye with normal saline.  Will continue to monitor.

## 2011-02-14 NOTE — Progress Notes (Signed)
Dr. Jacklynn Bue at bedside checking on pt.  Informed him of pt's complaint of right eye burning, which continued despite rinsing with NS and medicating with dilaudid 0.5 mg IV.  Dr. Jacklynn Bue examined pt's eye and encouraged pt to not rub eye and to keep it closed if possible as this would keep the eye more moist.  Dr. Jacklynn Bue also informed pt that her eye would feel better but that it would take awhile to do so.  Pt stated understanding of Dr. Marlane Mingle instructions.  Will continue to monitor.

## 2011-02-14 NOTE — H&P (Signed)
I have reexamined and reevaluated the patient and there are no changes. See office note dated 29 January 2011.

## 2011-02-14 NOTE — Progress Notes (Signed)
C/O pain in right eye. Anesthesia is managing her for a possible scratched cornea. Otherwise, minimal discomfort at surgical sites.  Chest soft. No evidence of hematoma either side. Right nipple complex has good color and capillary refill. Viable.  Doing well. Will restart Lovenox tomorrow night.

## 2011-02-14 NOTE — Brief Op Note (Signed)
02/14/2011  11:45 AM  PATIENT:  [name]  40 y.o. female  PRE-OPERATIVE DIAGNOSIS:  Left Breast Cancer   POST-OPERATIVE DIAGNOSIS:  Left Breast Cancer  PROCEDURE:  Procedure(s): REMOVE LEFT TISSUE EXPANDER AND PLACEMENT SILICONE GEL IMPLANT RIGHT MASTOPEXY WITH EXCISION SMALL AMOUNT BREAST TISSUE  SURGEON:  Surgeon(s): Rossie Muskrat  PHYSICIAN ASSISTANT:   ASSISTANTS: RNFA   ANESTHESIA:   general  EBL:  Total I/O In: 2000 [I.V.:2000] Out: 25 [Blood:25]  BLOOD ADMINISTERED:none  DRAINS: none   LOCAL MEDICATIONS USED:  NONE  SPECIMEN:  Source of Specimen:  right breast tissue  DISPOSITION OF SPECIMEN:  PATHOLOGY  COUNTS:  YES  TOURNIQUET:  * No tourniquets in log *  DICTATION: .Other Dictation: Dictation Number [id number]  PLAN OF CARE: Admit to inpatient   PATIENT DISPOSITION:  PACU - hemodynamically stable.   Delay start of Pharmacological VTE agent (>24hrs) due to surgical blood loss or risk of bleeding:  {YES/NO/NOT APPLICABLE:20182

## 2011-02-14 NOTE — Addendum Note (Signed)
Addendum  created 02/14/11 1347 by Kerby Nora, MD   Modules edited:Orders

## 2011-02-15 ENCOUNTER — Encounter (HOSPITAL_COMMUNITY): Payer: Self-pay | Admitting: Plastic Surgery

## 2011-02-15 MED ORDER — PROMETHAZINE HCL 25 MG/ML IJ SOLN
12.5000 mg | INTRAMUSCULAR | Status: DC | PRN
  Administered 2011-02-15 – 2011-02-17 (×5): 12.5 mg via INTRAVENOUS
  Filled 2011-02-15 (×5): qty 1

## 2011-02-15 MED ORDER — DEXTROSE-NACL 5-0.45 % IV SOLN
INTRAVENOUS | Status: DC
  Administered 2011-02-15 – 2011-02-16 (×2): via INTRAVENOUS

## 2011-02-15 MED ORDER — ACETAMINOPHEN 325 MG PO TABS
650.0000 mg | ORAL_TABLET | ORAL | Status: DC | PRN
  Administered 2011-02-15: 650 mg via ORAL
  Filled 2011-02-15: qty 2

## 2011-02-15 MED ORDER — PROMETHAZINE HCL 25 MG PO TABS
25.0000 mg | ORAL_TABLET | ORAL | Status: DC | PRN
  Administered 2011-02-15 – 2011-02-17 (×3): 25 mg via ORAL
  Filled 2011-02-15 (×3): qty 1

## 2011-02-15 MED ORDER — HYDROMORPHONE HCL 2 MG PO TABS
2.0000 mg | ORAL_TABLET | ORAL | Status: DC | PRN
  Administered 2011-02-15 – 2011-02-16 (×3): 4 mg via ORAL
  Filled 2011-02-15 (×3): qty 2

## 2011-02-15 MED ORDER — METHOCARBAMOL 500 MG PO TABS
500.0000 mg | ORAL_TABLET | Freq: Four times a day (QID) | ORAL | Status: DC
  Administered 2011-02-15 – 2011-02-17 (×8): 500 mg via ORAL
  Filled 2011-02-15 (×12): qty 1

## 2011-02-15 MED ORDER — METOCLOPRAMIDE HCL 5 MG/ML IJ SOLN
10.0000 mg | Freq: Three times a day (TID) | INTRAMUSCULAR | Status: DC | PRN
  Administered 2011-02-16: 10 mg via INTRAVENOUS
  Filled 2011-02-15: qty 2

## 2011-02-15 NOTE — Op Note (Addendum)
[name], [name] NO.:  [id number]  MEDICAL RECORD NO.:  [id number]  LOCATION:  5118                         FACILITY:  [facility]  PHYSICIAN:  Etter Sjogren, M.D.     DATE OF BIRTH:  [birth year]  DATE OF PROCEDURE:  02/14/2011 DATE OF DISCHARGE:                              OPERATIVE REPORT   PREOPERATIVE DIAGNOSIS:  Breast cancer.  POSTOPERATIVE DIAGNOSIS:  Breast cancer.  PROCEDURE PERFORMED: 1. Removal of tissue expander and placement of silicone gel implant     for breast reconstruction, left side. 2. Right breast reduction  SURGEON:  Etter Sjogren, M.D.  ANESTHESIA:  General.  ESTIMATED BLOOD LOSS:  40 mL.  CLINICAL NOTE:  This is a 40 year old woman who had breast cancer on the left side.  She has had mastectomy and has had reconstruction with tissue expanders.  She now presents for removal of expander and placement of implant.  In addition, she needs to have the opposite breast lifted for symmetry into a more favorable position and a very small amount of breast tissue removed through the same approach in order to give her symmetry with the opposite side.  The nature of these procedures and risks plus complications were discussed with her in great detail.  She understood that she was at added risk for bleeding because she does require anticoagulation for a mitral valve.  Risks include not limited to, bleeding, infection, anesthesia complications, healing problems, scarring, loss of sensation, fluid accumulations, failure of device, capsular contracture, displacement of device, wrinkles, ripples, pneumothorax, pulmonary embolism, asymmetry, loss of tissue, loss of the right nipple, loss of skin, loss of fatty tissue, asymmetry, disappointment, and she understood all of this and wished to proceed. The patient had her last dose of Lovenox yesterday and according to her cardiologist, it will be fine to resume her Lovenox tomorrow.  DESCRIPTION OF  PROCEDURE:  The patient was marked in a full standing position in the holding area for the mastopexy on the right with removal of small amount of breast tissue and the left side capsulotomy for placement of the implant.  She was taken to the operating room and placed supine.  After successful general anesthesia, she was prepped with ChloraPrep and after waiting full 3 minutes for drying, she was draped with Steri-Drapes in previous Steri-Drapes as well as sterile drapes.  The left side was approached first.  Using the lateral aspect of the old mastectomy scar, the incision was made.  Dissection carried down to subcutaneous tissue and underlying muscle was identified.  The skin flaps were elevated for short distance and a muscle-splitting incision used to access the tissue expander, which was deflated.  The space was inspected and was found to be in excellent condition.  A capsulotomy was performed inferiorly in order to allow for a little further relaxation of the implant inferiorly.  Thorough irrigation with saline.  Hemostasis with electrocautery.  Meticulous hemostasis having been achieved.  The space was irrigated with saline followed by antibiotic solution, which was allowed to dwell on the space for greater than 5 minutes and the implant was also soaked in antibiotic solution for greater than 5 minutes.  This was  a mentor 400 mL high profile silicone gel implant.  After thoroughly cleaning the gloves, the implant was positioned after again placing antibiotic solution in the space and it fit very nicely.  Antibiotic solution again placed in the muscle closure with 3-0 Vicryl interrupted figure-of-eight sutures.  Great care was taken to avoid damage to underlying implant, which was kept under direct vision at all times.  Antibiotic solution and saline again used for irrigation of the wound and 3-0 Monocryl inverted deep dermal sutures and a running 3-0 Monocryl subcuticular suture  completed the closure.  Attention was directed to the right side, where a 40-mm diameter nipple- areolar complex was marked and the 8 cm wide inferior nipple-areolar pedicle was marked.  Incision was made.  The inferior nipple-areolar pedicle de-epithelialized and then was isolated from surrounding tissue beveling in an outward direction medial and lateral in order to ensure a broader attachment at the level of chest wall than was present at the level of the skin.  At completion of dissection, the nipple-areolar complex had excellent color and bright-red bleeding around its periphery consistent with viability.  Resections were performed in medial, central and lateral, and total of 104 g resected.  This had been planned preoperatively in order to give her better balance on each side. Thorough irrigation with saline.  Meticulous hemostasis with electrocautery.  As hemostasis having been assured, the closure with 3-0 Monocryl inverted deep dermal sutures with a 2-0 Vicryl inverted deep suture at the inverted T position, 3-0 Monocryl running subcuticular suture for inframammary crease.  A measurement was taken 5 cm up from the inframammary crease and 40 mm opening for the nipple-areolar complex was marked and incised and that tissue resected.  Thorough irrigation with saline, again hemostasis with electrocautery, nipple-areolar complex brought through this site and was inspected and again found to have excellent color and bright-red bleeding around the periphery of it, consistent with viability.  Nipple-areolar insetting with 3-0 Monocryl interrupted inverted deep dermal sutures and a running 4-0 Monocryl subcuticular suture.  Steri-Strips, Xeroform gauze, dry sterile dressing, and a circumferential Ace wrap applied.  She was transferred to the recovery room in stable having tolerated the procedure well.  DISPOSITION:  She will be admitted for inpatient observation.     Etter Sjogren,  M.D.     DB/MEDQ  D:  02/14/2011  T:  02/15/2011  Job:  [id number]

## 2011-02-15 NOTE — Progress Notes (Signed)
Subjective: Eye pain hasresolved. Minimal discomfort at surgical sites.  Objective: Vital signs in last 24 hours: Temp:  [97.6 F (36.4 C)-98.4 F (36.9 C)] 98 F (36.7 C) (12/27 0555) Pulse Rate:  [83-99] 84  (12/27 0555) Resp:  [10-25] 18  (12/27 0555) BP: (98-148)/(64-84) 98/64 mmHg (12/27 0555) SpO2:  [93 %-100 %] 97 % (12/27 0555) Weight:  [173 lb 11.6 oz (78.8 kg)] 173 lb 11.6 oz (78.8 kg) (12/26 1420)  Intake/Output from previous day: 12/26 0701 - 12/27 0700 In: 3886 [P.O.:600; I.V.:3286] Out: 25 [Blood:25] Intake/Output this shift:    Operative sites: Right breast reduction soft. No evidence of bleeding. Nipple has good capillary refill at 1-2 sec. Viable.Left side is soft with implant in good position.  No results found for this basename: WBC:2,HGB:2,HCT:2,PLATELETS:2,NA:2,K:2,CL:2,CO2:2,BUN:2,CREATININE:2,GLU:2 in the last 72 hours  Studies/Results: No results found.  Assessment/Plan: Begin Lovenox anticoagulation this evening. PO pain meds.  LOS: 1 day    Charlies Rayburn M 02/15/2011 7:51 AM

## 2011-02-15 NOTE — Addendum Note (Signed)
Addendum  created 02/15/11 1449 by Joneen Caraway   Modules edited:Anesthesia Events

## 2011-02-15 NOTE — Progress Notes (Signed)
UR of chart completed. No anticipated HH needs.  

## 2011-02-16 NOTE — Progress Notes (Signed)
Subjective: Feels better. Pain is minimal. Continues to have some nausea but tolerating liquids.  Objective: Vital signs in last 24 hours: Temp:  [98.7 F (37.1 C)-102.2 F (39 C)] 99.4 F (37.4 C) (12/28 1335) Pulse Rate:  [97-122] 101  (12/28 1335) Resp:  [16-18] 17  (12/28 1335) BP: (92-114)/(55-68) 114/68 mmHg (12/28 1335) SpO2:  [94 %-98 %] 98 % (12/28 1025)  Intake/Output from previous day: 12/27 0701 - 12/28 0700 In: 1161.7 [P.O.:240; I.V.:921.7] Out: -  Intake/Output this shift: Total I/O In: 360 [P.O.:360] Out: -   Operative sites: Left implant in good position. Chest soft. Right breast soft. No evidence of hematoma or infection either side. Right nipple complex pink and viable.  No results found for this basename: WBC:2,HGB:2,HCT:2,PLATELETS:2,NA:2,K:2,CL:2,CO2:2,BUN:2,CREATININE:2,GLU:2 in the last 72 hours  Studies/Results: No results found.  Assessment/Plan: Saline lock IV. Possibly ready for discharge tomorrow depending upon nausea issue.  LOS: 2 days    Naraya Stoneberg M 02/16/2011 2:46 PM

## 2011-02-17 MED ORDER — HYDROMORPHONE HCL 2 MG PO TABS: 2.0000 mg | ORAL_TABLET | ORAL | Status: AC | PRN

## 2011-02-17 MED ORDER — PROMETHAZINE HCL 25 MG PO TABS: 25.0000 mg | ORAL_TABLET | ORAL | Status: DC | PRN

## 2011-02-17 MED ORDER — DSS 100 MG PO CAPS: 100.0000 mg | ORAL_CAPSULE | Freq: Two times a day (BID) | ORAL | Status: AC

## 2011-02-17 MED ORDER — ENOXAPARIN SODIUM 80 MG/0.8ML ~~LOC~~ SOLN: 70.0000 mg | Freq: Two times a day (BID) | SUBCUTANEOUS | Status: DC

## 2011-02-17 NOTE — Progress Notes (Signed)
Iv dc'd. Home meds gone over, prescriptions given, patient has previusly given lovenox shots. Home care instructions gone over. Patient to make follow up appointment, and follow up with coumadin clinic in [place], [place]. Incisional care and activities gone over, s's of infections gone over. Patient verbalized understanding of all.

## 2011-02-17 NOTE — Discharge Summary (Signed)
Physician Discharge Summary  Patient ID: LENISE JR MRN: [id number] DOB/AGE: [birth year] 40 y.o.  Admit date: 02/14/2011 Discharge date: 02/17/2011  Admission Diagnoses: Personal history of breast cancer and mitral valve replacement  Discharge Diagnoses: Same Active Problems:  * No active hospital problems. *    Discharged Condition: good  Hospital Course: On the day of admission the patient was taken to surgery and had removal of tissue expander and placement of silicone gel implant on left and right breast reduction for symmetry. The patient tolerated the procedures well. Postoperatively, the right nipple complex maintained excellent color and capillary refill. The patient was ambulatory and tolerating diet on the first postoperative day. She was started on Lovenox 70 mg subcu on evening of first postop day as instructed by her cardiologist. She did have some postop nausea on the second day that delayed her discharge. Nausea improved and she was ready for discharge on day 3.  Treatments: antibiotics: Ancef, anticoagulation: LMW heparin and surgery: left breast reconstruction with removal of expander and placement of implant and right breast reduction.  Discharge Exam: Blood pressure 106/71, pulse 76, temperature 99.2 F (37.3 C), temperature source Oral, resp. rate 18, height 5\' 4"  (1.626 m), weight 173 lb 11.6 oz (78.8 kg), SpO2 95.00%.  Operative sites: Left implant in good position. Right breast appears fine with nipple complex viable. Both sides are soft and there is no evidence of bleeding or infection.  Disposition: Home or Self Care  Discharge Orders    Future Appointments: Provider: Department: Dept Phone: Center:   02/19/2011 11:50 AM Louanna Raw, RN [facility] [phone] [facility]   06/19/2011 11:30 AM Randall An, MD [facility] [phone] None     Current Discharge Medication List    START taking these medications   Details  docusate  sodium 100 MG CAPS Take 100 mg by mouth 2 (two) times daily. Qty: 30 capsule, Refills: 1    !! enoxaparin (LOVENOX) 80 MG/0.8ML SOLN injection Inject 0.7 mLs (70 mg total) into the skin every 12 (twelve) hours. Qty: 60 Syringe, Refills: 0    HYDROmorphone (DILAUDID) 2 MG tablet Take 1-2 tablets (2-4 mg total) by mouth every 4 (four) hours as needed for pain. Qty: 50 tablet, Refills: 0    promethazine (PHENERGAN) 25 MG tablet Take 1 tablet (25 mg total) by mouth every 4 (four) hours as needed for nausea. Qty: 20 tablet, Refills: 0     !! - Potential duplicate medications found. Please discuss with provider.    CONTINUE these medications which have NOT CHANGED   Details  cyanocobalamin (,VITAMIN B-12,) 1000 MCG/ML injection Inject 1 mL (1,000 mcg total) into the muscle every 30 (thirty) days. Qty: 10 mL, Refills: prn   Associated Diagnoses: Vitamin B12 deficiency    !! enoxaparin (LOVENOX) 80 MG/0.8ML SOLN injection Inject 80 mg into the skin every 12 (twelve) hours.      folic acid (FOLVITE) 1 MG tablet Take 1 mg by mouth daily.      furosemide (LASIX) 40 MG tablet Take 20 mg by mouth daily.     gabapentin (NEURONTIN) 600 MG tablet Take 600 mg by mouth 3 (three) times daily.      hydroxychloroquine (PLAQUENIL) 200 MG tablet Take 200 mg by mouth 2 (two) times daily.      pantoprazole (PROTONIX) 40 MG tablet Take 40 mg by mouth daily.      potassium chloride SA (K-DUR,KLOR-CON) 20 MEQ tablet Take 20 mEq by mouth daily.  rOPINIRole (REQUIP) 0.5 MG tablet Take 1 mg by mouth at bedtime.     zolpidem (AMBIEN) 10 MG tablet Take 10 mg by mouth at bedtime as needed. For sleep.      !! - Potential duplicate medications found. Please discuss with provider.    STOP taking these medications     cyclobenzaprine (FLEXERIL) 10 MG tablet      HYDROcodone-acetaminophen (VICODIN) 5-500 MG per tablet      warfarin (COUMADIN) 5 MG tablet          Signed: Evanell Redlich M 02/17/2011,  10:59 AM

## 2011-02-19 ENCOUNTER — Encounter: Payer: PRIVATE HEALTH INSURANCE | Admitting: *Deleted

## 2011-02-19 ENCOUNTER — Telehealth: Payer: Self-pay | Admitting: *Deleted

## 2011-02-19 NOTE — Telephone Encounter (Signed)
PT CANCELED APPT FOR TODAY. WHEN SHE WAS SENT HOME FROM SURGERY THEY GAVE HER INSTRUCTIONS TO STAY ON LOVENOX SHOTS FOR A MONTH. CALLED IN RX FOR 60 AND THEY WOULD ONLY GIVE HER HALF OF THEM.  NEED [place] TO CALL AND GIVE APPROVAL FOR THE REST.

## 2011-02-19 NOTE — Telephone Encounter (Signed)
Spoke with pharmacy tech.  They had to order more Lovenox for her.  There is not a problem with the Rx.

## 2011-03-06 DIAGNOSIS — E039 Hypothyroidism, unspecified: Secondary | ICD-10-CM | POA: Diagnosis not present

## 2011-03-06 DIAGNOSIS — G4700 Insomnia, unspecified: Secondary | ICD-10-CM | POA: Diagnosis not present

## 2011-03-06 DIAGNOSIS — Z6828 Body mass index (BMI) 28.0-28.9, adult: Secondary | ICD-10-CM | POA: Diagnosis not present

## 2011-03-06 DIAGNOSIS — G8929 Other chronic pain: Secondary | ICD-10-CM | POA: Diagnosis not present

## 2011-03-13 ENCOUNTER — Telehealth: Payer: Self-pay | Admitting: *Deleted

## 2011-03-13 NOTE — Telephone Encounter (Signed)
Pt has been on Lovenox since breast reconstructive surgery.  Received OK from Dr Odis Luster yesterday to resume coumadin and stop Lovenox when INR  > 2.5.  She took coumadin 10mg  last night and will take 10mg  1/22 and 1/23 and come for INR check on 03/15/11.

## 2011-03-13 NOTE — Telephone Encounter (Signed)
Patient off Lovenox now.  Needs to have coumadin re-established.  Please call.

## 2011-03-15 ENCOUNTER — Ambulatory Visit (INDEPENDENT_AMBULATORY_CARE_PROVIDER_SITE_OTHER): Payer: PRIVATE HEALTH INSURANCE | Admitting: *Deleted

## 2011-03-15 DIAGNOSIS — I059 Rheumatic mitral valve disease, unspecified: Secondary | ICD-10-CM | POA: Diagnosis not present

## 2011-03-15 DIAGNOSIS — Z7901 Long term (current) use of anticoagulants: Secondary | ICD-10-CM

## 2011-03-15 DIAGNOSIS — Z954 Presence of other heart-valve replacement: Secondary | ICD-10-CM

## 2011-03-19 ENCOUNTER — Ambulatory Visit (INDEPENDENT_AMBULATORY_CARE_PROVIDER_SITE_OTHER): Payer: Medicaid Other | Admitting: *Deleted

## 2011-03-19 DIAGNOSIS — Z7901 Long term (current) use of anticoagulants: Secondary | ICD-10-CM

## 2011-03-19 DIAGNOSIS — I059 Rheumatic mitral valve disease, unspecified: Secondary | ICD-10-CM

## 2011-03-19 DIAGNOSIS — Z954 Presence of other heart-valve replacement: Secondary | ICD-10-CM | POA: Diagnosis not present

## 2011-03-19 LAB — POCT INR: INR: 2.1

## 2011-03-22 ENCOUNTER — Ambulatory Visit (INDEPENDENT_AMBULATORY_CARE_PROVIDER_SITE_OTHER): Payer: PRIVATE HEALTH INSURANCE | Admitting: *Deleted

## 2011-03-22 DIAGNOSIS — Z7901 Long term (current) use of anticoagulants: Secondary | ICD-10-CM

## 2011-03-22 DIAGNOSIS — Z954 Presence of other heart-valve replacement: Secondary | ICD-10-CM

## 2011-03-22 DIAGNOSIS — I059 Rheumatic mitral valve disease, unspecified: Secondary | ICD-10-CM | POA: Diagnosis not present

## 2011-03-22 MED ORDER — WARFARIN SODIUM 5 MG PO TABS: ORAL_TABLET | ORAL | Status: DC

## 2011-03-28 ENCOUNTER — Ambulatory Visit (INDEPENDENT_AMBULATORY_CARE_PROVIDER_SITE_OTHER): Payer: PRIVATE HEALTH INSURANCE | Admitting: *Deleted

## 2011-03-28 DIAGNOSIS — Z7901 Long term (current) use of anticoagulants: Secondary | ICD-10-CM | POA: Diagnosis not present

## 2011-03-28 DIAGNOSIS — I059 Rheumatic mitral valve disease, unspecified: Secondary | ICD-10-CM | POA: Diagnosis not present

## 2011-03-28 DIAGNOSIS — Z954 Presence of other heart-valve replacement: Secondary | ICD-10-CM | POA: Diagnosis not present

## 2011-04-02 ENCOUNTER — Ambulatory Visit (INDEPENDENT_AMBULATORY_CARE_PROVIDER_SITE_OTHER): Payer: PRIVATE HEALTH INSURANCE | Admitting: *Deleted

## 2011-04-02 ENCOUNTER — Other Ambulatory Visit (HOSPITAL_COMMUNITY)
Admission: RE | Admit: 2011-04-02 | Discharge: 2011-04-02 | Disposition: A | Discharge status: Home / Self Care | Payer: PRIVATE HEALTH INSURANCE | Source: Ambulatory Visit | Attending: Obstetrics and Gynecology | Admitting: Obstetrics and Gynecology

## 2011-04-02 ENCOUNTER — Other Ambulatory Visit: Payer: Self-pay | Admitting: Adult Health

## 2011-04-02 DIAGNOSIS — Z7901 Long term (current) use of anticoagulants: Secondary | ICD-10-CM | POA: Diagnosis not present

## 2011-04-02 DIAGNOSIS — Z954 Presence of other heart-valve replacement: Secondary | ICD-10-CM

## 2011-04-02 DIAGNOSIS — Z124 Encounter for screening for malignant neoplasm of cervix: Secondary | ICD-10-CM | POA: Insufficient documentation

## 2011-04-02 DIAGNOSIS — I059 Rheumatic mitral valve disease, unspecified: Secondary | ICD-10-CM | POA: Diagnosis not present

## 2011-04-02 LAB — POCT INR: INR: 1.6

## 2011-04-03 ENCOUNTER — Ambulatory Visit (HOSPITAL_COMMUNITY)
Admission: RE | Admit: 2011-04-03 | Discharge: 2011-04-03 | Disposition: A | Discharge status: Home / Self Care | Payer: PRIVATE HEALTH INSURANCE | Source: Ambulatory Visit | Attending: Adult Health | Admitting: Adult Health

## 2011-04-03 ENCOUNTER — Other Ambulatory Visit: Payer: Self-pay | Admitting: Adult Health

## 2011-04-03 DIAGNOSIS — R05 Cough: Secondary | ICD-10-CM | POA: Insufficient documentation

## 2011-04-03 DIAGNOSIS — C50919 Malignant neoplasm of unspecified site of unspecified female breast: Secondary | ICD-10-CM | POA: Insufficient documentation

## 2011-04-03 DIAGNOSIS — R059 Cough, unspecified: Secondary | ICD-10-CM

## 2011-04-04 ENCOUNTER — Ambulatory Visit (INDEPENDENT_AMBULATORY_CARE_PROVIDER_SITE_OTHER): Payer: PRIVATE HEALTH INSURANCE | Admitting: *Deleted

## 2011-04-04 DIAGNOSIS — Z7901 Long term (current) use of anticoagulants: Secondary | ICD-10-CM | POA: Diagnosis not present

## 2011-04-04 DIAGNOSIS — I059 Rheumatic mitral valve disease, unspecified: Secondary | ICD-10-CM | POA: Diagnosis not present

## 2011-04-04 DIAGNOSIS — Z954 Presence of other heart-valve replacement: Secondary | ICD-10-CM

## 2011-04-04 LAB — POCT INR: INR: 2.8

## 2011-04-09 ENCOUNTER — Ambulatory Visit (INDEPENDENT_AMBULATORY_CARE_PROVIDER_SITE_OTHER): Payer: PRIVATE HEALTH INSURANCE | Admitting: *Deleted

## 2011-04-09 DIAGNOSIS — Z954 Presence of other heart-valve replacement: Secondary | ICD-10-CM | POA: Diagnosis not present

## 2011-04-09 DIAGNOSIS — I059 Rheumatic mitral valve disease, unspecified: Secondary | ICD-10-CM

## 2011-04-09 DIAGNOSIS — Z7901 Long term (current) use of anticoagulants: Secondary | ICD-10-CM

## 2011-04-16 ENCOUNTER — Ambulatory Visit (INDEPENDENT_AMBULATORY_CARE_PROVIDER_SITE_OTHER): Payer: PRIVATE HEALTH INSURANCE | Admitting: *Deleted

## 2011-04-16 DIAGNOSIS — I059 Rheumatic mitral valve disease, unspecified: Secondary | ICD-10-CM

## 2011-04-16 DIAGNOSIS — Z7901 Long term (current) use of anticoagulants: Secondary | ICD-10-CM

## 2011-04-16 DIAGNOSIS — Z954 Presence of other heart-valve replacement: Secondary | ICD-10-CM | POA: Diagnosis not present

## 2011-04-16 LAB — POCT INR: INR: 3.9

## 2011-04-17 ENCOUNTER — Other Ambulatory Visit (HOSPITAL_COMMUNITY): Payer: Self-pay | Admitting: Oncology

## 2011-04-25 ENCOUNTER — Ambulatory Visit (INDEPENDENT_AMBULATORY_CARE_PROVIDER_SITE_OTHER): Payer: PRIVATE HEALTH INSURANCE | Admitting: *Deleted

## 2011-04-25 DIAGNOSIS — I059 Rheumatic mitral valve disease, unspecified: Secondary | ICD-10-CM

## 2011-04-25 DIAGNOSIS — Z7901 Long term (current) use of anticoagulants: Secondary | ICD-10-CM | POA: Diagnosis not present

## 2011-04-25 DIAGNOSIS — Z954 Presence of other heart-valve replacement: Secondary | ICD-10-CM

## 2011-04-25 LAB — POCT INR: INR: 2.9

## 2011-05-03 ENCOUNTER — Ambulatory Visit (INDEPENDENT_AMBULATORY_CARE_PROVIDER_SITE_OTHER): Payer: PRIVATE HEALTH INSURANCE | Admitting: Surgery

## 2011-05-03 ENCOUNTER — Encounter (INDEPENDENT_AMBULATORY_CARE_PROVIDER_SITE_OTHER): Payer: Self-pay | Admitting: Surgery

## 2011-05-03 VITALS — BP 118/82 | HR 68 | Temp 97.8°F | Resp 16 | Ht 64.0 in | Wt 180.4 lb

## 2011-05-03 DIAGNOSIS — C50919 Malignant neoplasm of unspecified site of unspecified female breast: Secondary | ICD-10-CM | POA: Diagnosis not present

## 2011-05-03 NOTE — Progress Notes (Signed)
ASSESSMENT AND PLAN: 1.  T1c, N0 left breast cancer.  Completed chemotx in July 2012.  Doing well.  Disease free.  Reduction of right breast, reconstruction of left breast by Dr. Odis Luster.  For nipple reconstruction in April, 2013.  She'll see me back in 6 months.  2.  Peripheral neuropathy, secondary to chemo.  Numbness in legs/feet is stable.  She is not sure she is getting any improvement.  3.  SLE. 4.  History of mitral valve replacement, on chronic Coumadin.  Sees Dr. Donnella Bi. 5.  On disability for heart issues.  HISTORY OF PRESENT ILLNESS: Chief Complaint  Patient presents with  . Breast Cancer Long Term Follow Up    [name] is a 41 y.o. (DOB: May 12, 1970)  white female who is a patient of Colette Ribas, MD, MD and comes to me today for follow up of left breast cancer.    She is doing well.  She is accompanied by her daughter.  She has no new complaint.  She is happy with her reconstruction.  She still has the numbness, particularly in her feet.  Path (side, TNM): T1c, N0 Surgery: left mastectomy  Date: 04/20/10 Size of tumor: 1.6 cm Nodes: 0/3 ER: 0% PR: 0% Ki67: 92  HER2Neu: Neg  Medical Oncologist: Neijstrom Radiation Oncologist: Kinard  (she did not require radiation tx) Plastics:  Bowers  PHYSICAL EXAM: BP 118/82  Pulse 68  Temp(Src) 97.8 F (36.6 C) (Temporal)  Resp 16  Ht 5\' 4"  (1.626 m)  Wt 180 lb 6.4 oz (81.829 kg)  BMI 30.97 kg/m2  HEENT:  Pupils equal.  Dentition good.  No injury.  Has sort of pigmented skin. NECK:  Supple.  No thyroid mass. LYMPH NODES:  No cervical, supraclavicular, or axillary adenopathy. BREASTS -  RIGHT:  No palpable mass or nodule.  No nipple discharge.  Upper breast where the porta cath was she has skin pigmentation.  She has the scars of reduction mammoplasty.   LEFT:  Chartered certified accountant. UPPER EXTREMITIES:  No evidence of lymphedema.  DATA REVIEWED: Mammogram- 01/01/2012 - negative.

## 2011-05-09 ENCOUNTER — Ambulatory Visit (INDEPENDENT_AMBULATORY_CARE_PROVIDER_SITE_OTHER): Payer: PRIVATE HEALTH INSURANCE | Admitting: *Deleted

## 2011-05-09 DIAGNOSIS — Z7901 Long term (current) use of anticoagulants: Secondary | ICD-10-CM | POA: Diagnosis not present

## 2011-05-09 DIAGNOSIS — I059 Rheumatic mitral valve disease, unspecified: Secondary | ICD-10-CM

## 2011-05-09 DIAGNOSIS — Z954 Presence of other heart-valve replacement: Secondary | ICD-10-CM

## 2011-05-09 LAB — POCT INR: INR: 4.7

## 2011-05-17 ENCOUNTER — Ambulatory Visit (INDEPENDENT_AMBULATORY_CARE_PROVIDER_SITE_OTHER): Payer: PRIVATE HEALTH INSURANCE | Admitting: *Deleted

## 2011-05-17 DIAGNOSIS — Z954 Presence of other heart-valve replacement: Secondary | ICD-10-CM | POA: Diagnosis not present

## 2011-05-17 DIAGNOSIS — M2550 Pain in unspecified joint: Secondary | ICD-10-CM | POA: Diagnosis not present

## 2011-05-17 DIAGNOSIS — R21 Rash and other nonspecific skin eruption: Secondary | ICD-10-CM | POA: Diagnosis not present

## 2011-05-17 DIAGNOSIS — I7300 Raynaud's syndrome without gangrene: Secondary | ICD-10-CM | POA: Diagnosis not present

## 2011-05-17 DIAGNOSIS — D696 Thrombocytopenia, unspecified: Secondary | ICD-10-CM | POA: Diagnosis not present

## 2011-05-17 DIAGNOSIS — I059 Rheumatic mitral valve disease, unspecified: Secondary | ICD-10-CM

## 2011-05-17 DIAGNOSIS — Z7901 Long term (current) use of anticoagulants: Secondary | ICD-10-CM

## 2011-05-17 DIAGNOSIS — M545 Low back pain, unspecified: Secondary | ICD-10-CM | POA: Diagnosis not present

## 2011-05-17 DIAGNOSIS — M329 Systemic lupus erythematosus, unspecified: Secondary | ICD-10-CM | POA: Diagnosis not present

## 2011-05-17 DIAGNOSIS — G609 Hereditary and idiopathic neuropathy, unspecified: Secondary | ICD-10-CM | POA: Diagnosis not present

## 2011-05-17 DIAGNOSIS — Z79899 Other long term (current) drug therapy: Secondary | ICD-10-CM | POA: Diagnosis not present

## 2011-05-17 MED ORDER — ENOXAPARIN SODIUM 80 MG/0.8ML ~~LOC~~ SOLN: 80.0000 mg | Freq: Two times a day (BID) | SUBCUTANEOUS | Status: DC

## 2011-05-17 NOTE — Patient Instructions (Signed)
Hold coumadin 4 days before and 4 days after procedure on 06/01/11 and bridge with Lovenox per Dr Odis Luster 4/7 Last dose of coumadin 4/8 No lovenox or coumadin 4/9 Lovenox 80mg  q 12 hrs 4/10  Lovenox 80mg  q 12 hrs 4/11  Lovenox 80mg  q am only 4/12  Procedure--Lovenox 80mg  pm only 4/13  Lovenox 80mg  q 12 hrs  4/14  Lovenox 80mg  q 12 hrs 4/15  Lovenox 80mg  q 12 hrs 4/16  Lovenox 80mg  q 12 hrs 4/17  Lovenox 80mg  q 12 hrs and coumadin 15mg  hs 4/18  Lovenox 80mg  q 12 hrs and coumadin 15mg  hs 4/19  Lovenox 80mg  q 12 hrs and coumadin 10mg  hs 4/20  Lovenox 80mg  q 12 hrs and coumadin 10mg  hs 4/21  Lovenox 80mg  q 12 hrs and coumadin 10mg  hs  4/22  lovenox 80mg  am   INR appt @ 2:00pm

## 2011-05-23 ENCOUNTER — Telehealth: Payer: Self-pay | Admitting: Cardiology

## 2011-05-23 NOTE — Telephone Encounter (Signed)
Forwarding to Coumadin nurse Vashti Hey, RN).  Let me know if still need letter from [facility].

## 2011-05-23 NOTE — Telephone Encounter (Signed)
PT HAS A INGROWN TOE NAIL AND NEEDS TO KNOW IF SHE CAN BE OFF COUMADIN FOR AT LEAST TWO DAYS TO HAVE IT "FIXED". THE DOC NEEDS A LETTER STATING THIS AND ALSO WOULD LIKE TO KNOW WHAT HER BLEEDING RATE IS ON LOVENOX SHOTS.

## 2011-05-23 NOTE — Telephone Encounter (Signed)
Please advise.  See coumadin note from 3/28 regarding Lovenox bridging.  She will be going off coumadin on 4/7 for nipple reconstruction.  Do you think she could have toenail procedure while on Lovenox only.  Dr Pricilla Holm is concerned about bleeding (on Lovenox) but he is only cutting the side on the nail on each great toe, but doing one toe at the time.

## 2011-05-24 DIAGNOSIS — I7300 Raynaud's syndrome without gangrene: Secondary | ICD-10-CM | POA: Diagnosis not present

## 2011-05-24 DIAGNOSIS — Z01818 Encounter for other preprocedural examination: Secondary | ICD-10-CM | POA: Diagnosis not present

## 2011-05-24 DIAGNOSIS — L600 Ingrowing nail: Secondary | ICD-10-CM | POA: Diagnosis not present

## 2011-05-28 DIAGNOSIS — L909 Atrophic disorder of skin, unspecified: Secondary | ICD-10-CM | POA: Diagnosis not present

## 2011-05-29 ENCOUNTER — Telehealth: Payer: Self-pay | Admitting: *Deleted

## 2011-05-29 NOTE — Telephone Encounter (Signed)
Fax # is [phone] for you to fax letter. / tg

## 2011-05-29 NOTE — Telephone Encounter (Signed)
She needs to be bridged with Lovenox.please review prior records when patient went for surgery with Dr. Odis Luster.  We had to significantly reduce her Lovenox because her anti-Xa levels were higher than expected based on her weight.[name] was involved with this and I'm sure she remembers this case.  We can go back and see what dose she was on or we can start her on Lovenox and start monitoring her anti- Xa before doing surgery on 4/7.  I think she can have her toenails done while she is on Lovenox particularly if we are going to monitor her levels as outlined above.  Please contact [name] because she was heavily involved with this patient.

## 2011-05-29 NOTE — Telephone Encounter (Signed)
Copy of clearance note faxed to Dr Winferd Humphrey office.

## 2011-05-30 DIAGNOSIS — L600 Ingrowing nail: Secondary | ICD-10-CM | POA: Diagnosis not present

## 2011-06-01 ENCOUNTER — Telehealth: Payer: Self-pay | Admitting: *Deleted

## 2011-06-01 NOTE — Telephone Encounter (Signed)
Done. Letter faxed.

## 2011-06-01 NOTE — Telephone Encounter (Signed)
Questions regarding when to start her Coumadin back. / tg

## 2011-06-01 NOTE — Telephone Encounter (Signed)
Pt had nipple reconstruction today and Dr Odis Luster told pt she can restart coumadin tomorrow.  She is on Lovenox.  Instructed pt to to follow previous bridging instructions just starting coumadin tomorrow instead of 4/17.  INR appt moved up to 4/18.  Pt verbalized understanding.

## 2011-06-07 ENCOUNTER — Ambulatory Visit (INDEPENDENT_AMBULATORY_CARE_PROVIDER_SITE_OTHER): Payer: PRIVATE HEALTH INSURANCE | Admitting: *Deleted

## 2011-06-07 DIAGNOSIS — I059 Rheumatic mitral valve disease, unspecified: Secondary | ICD-10-CM

## 2011-06-07 DIAGNOSIS — Z7901 Long term (current) use of anticoagulants: Secondary | ICD-10-CM | POA: Diagnosis not present

## 2011-06-07 DIAGNOSIS — Z954 Presence of other heart-valve replacement: Secondary | ICD-10-CM

## 2011-06-11 ENCOUNTER — Ambulatory Visit (INDEPENDENT_AMBULATORY_CARE_PROVIDER_SITE_OTHER): Payer: PRIVATE HEALTH INSURANCE | Admitting: *Deleted

## 2011-06-11 DIAGNOSIS — I059 Rheumatic mitral valve disease, unspecified: Secondary | ICD-10-CM | POA: Diagnosis not present

## 2011-06-11 DIAGNOSIS — Z7901 Long term (current) use of anticoagulants: Secondary | ICD-10-CM

## 2011-06-11 DIAGNOSIS — Z954 Presence of other heart-valve replacement: Secondary | ICD-10-CM

## 2011-06-11 LAB — POCT INR: INR: 3.8

## 2011-06-12 DIAGNOSIS — L600 Ingrowing nail: Secondary | ICD-10-CM | POA: Diagnosis not present

## 2011-06-18 ENCOUNTER — Encounter: Payer: Self-pay | Admitting: Cardiology

## 2011-06-18 ENCOUNTER — Other Ambulatory Visit: Payer: Self-pay | Admitting: Cardiology

## 2011-06-18 ENCOUNTER — Ambulatory Visit (INDEPENDENT_AMBULATORY_CARE_PROVIDER_SITE_OTHER): Payer: PRIVATE HEALTH INSURANCE | Admitting: Cardiology

## 2011-06-18 VITALS — BP 94/68 | HR 93 | Ht 64.0 in | Wt 184.0 lb

## 2011-06-18 DIAGNOSIS — G629 Polyneuropathy, unspecified: Secondary | ICD-10-CM

## 2011-06-18 DIAGNOSIS — Z5181 Encounter for therapeutic drug level monitoring: Secondary | ICD-10-CM

## 2011-06-18 DIAGNOSIS — Z952 Presence of prosthetic heart valve: Secondary | ICD-10-CM

## 2011-06-18 DIAGNOSIS — G609 Hereditary and idiopathic neuropathy, unspecified: Secondary | ICD-10-CM | POA: Diagnosis not present

## 2011-06-18 DIAGNOSIS — I059 Rheumatic mitral valve disease, unspecified: Secondary | ICD-10-CM

## 2011-06-18 DIAGNOSIS — R509 Fever, unspecified: Secondary | ICD-10-CM | POA: Diagnosis not present

## 2011-06-18 DIAGNOSIS — Z7901 Long term (current) use of anticoagulants: Secondary | ICD-10-CM | POA: Diagnosis not present

## 2011-06-18 DIAGNOSIS — C50919 Malignant neoplasm of unspecified site of unspecified female breast: Secondary | ICD-10-CM

## 2011-06-18 MED ORDER — TRAMADOL HCL (ER BIPHASIC) 100 MG PO CP24: 1.0000 | ORAL_CAPSULE | ORAL | Status: DC | PRN

## 2011-06-18 NOTE — Assessment & Plan Note (Signed)
Patient reportedly is cancer free. She receives continued followup

## 2011-06-18 NOTE — Progress Notes (Signed)
[name] Bottoms, MD, [facility] ABIM Board Certified in Adult Cardiovascular Medicine,Internal Medicine and Critical Care Medicine    CC: Followup patient with mitral valve replacement  HPI:  The patient is doing well. She denies any chest pain shortness of breath orthopnea PND. Only concern is that a couple of occasions she has had fevers for one day with on some days of fever up to 102 Fahrenheit. They typically last 24 hours and she only recalls a couple of episodes in the last few months. She has no other stigmata of endocarditis and has had no prolonged fevers. She has no orthopnea PND palpitations or syncope. She is otherwise stable from a cardiovascular perspective she does report severe symptoms of peripheral neuropathy related to her chemotherapy. She is on high-dose gabapentin is still has significant pain.  PMH: reviewed and listed in Problem List in Electronic Records (and see below) Past Medical History  Diagnosis Date  . Cancer     LEFT SIDE BREAST  . Lupus   . BRCA2 negative 08/16/2010  . BRCA1 negative 08/16/2010  . BREAST CANCER 02/24/2010  . 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 B12 deficiency 08/21/2010  . Mitral valve replaced     St. Jude bileaflet  . Raynaud disease   . Fibromyalgia   . Anxiety   . CHF (congestive heart failure)   . Peripheral neuropathy   . PONV (postoperative nausea and vomiting)   . Anticoagulation goal of INR 2.5 to 3.5    Past Surgical History  Procedure Date  . Mitral valve replacement   . Cholecystectomy   . Mastectomy   . Cardiac valve replacement   . Breast surgery   . Endometrial ablation 2008    She no longer has menses  . Insertion expander left breast 11/16/10  . Tissue expander placement 02/14/2011    Procedure: TISSUE EXPANDER;  Surgeon: Rossie Muskrat;  Location: [facility] OR;  Service: Plastics;  Laterality: Bilateral;  Left Breast Remove Tissue Expander Placement of Implant Breast Reconstruction  .  Mastopexy 02/14/2011    Procedure: MASTOPEXY;  Surgeon: Rossie Muskrat;  Location: [facility] OR;  Service: Plastics;  Laterality: Bilateral;  Right Breast Reduction     Allergies/SH/FHX : available in Electronic Records for review  No Known Allergies History   Social History  . Marital Status: Legally Separated    Spouse Name: N/A    Number of Children: N/A  . Years of Education: N/A   Occupational History  . Not on file.   Social History Main Topics  . Smoking status: Former Smoker -- 1.0 packs/day for 15 years    Types: Cigarettes    Quit date: 02/19/2006  . Smokeless tobacco: Never Used  . Alcohol Use: Yes     occ  . Drug Use: No  . Sexually Active: No   Other Topics Concern  . Not on file   Social History Narrative  . No narrative on file   No family history on file.  Medications: Current Outpatient Prescriptions  Medication Sig Dispense Refill  . cyanocobalamin (,VITAMIN B-12,) 1000 MCG/ML injection Inject 1 mL (1,000 mcg total) into the muscle every 30 (thirty) days.  10 mL  prn  . folic acid (FOLVITE) 1 MG tablet Take 1 mg by mouth daily.        . furosemide (LASIX) 40 MG tablet Take 20 mg by mouth daily.       Marland Kitchen gabapentin (NEURONTIN) 600 MG tablet Take 600  mg by mouth 3 (three) times daily.        Marland Kitchen HYDROcodone-acetaminophen (VICODIN) 5-500 MG per tablet Take 1 tablet by mouth every 6 (six) hours as needed.      . hydroxychloroquine (PLAQUENIL) 200 MG tablet Take 200 mg by mouth 2 (two) times daily.        Marland Kitchen KLOR-CON M20 20 MEQ tablet TAKE ONE TABLET BY MOUTH EVERY DAY  30 each  5  . rOPINIRole (REQUIP) 0.5 MG tablet Take 1 mg by mouth at bedtime.       Marland Kitchen warfarin (COUMADIN) 5 MG tablet Take 7.5mg  daily except 10mg  on M,W,F or as directed by coumadin clinic  60 tablet  3  . zolpidem (AMBIEN) 10 MG tablet Take 10 mg by mouth at bedtime as needed.      Marland Kitchen DISCONTD: zolpidem (AMBIEN) 10 MG tablet Take 1 tablet (10 mg total) by mouth at bedtime as needed for sleep.  30  tablet  0  . TraMADol HCl 100 MG CP24 Take 1 tablet by mouth as needed.  20 capsule  0  . DISCONTD: zolpidem (AMBIEN) 10 MG tablet Take 10 mg by mouth at bedtime as needed. For sleep.         ROS: No nausea or vomiting. No fever or chills.No melena or hematochezia.No bleeding.No claudication  Physical Exam: BP 94/68  Pulse 93  Ht 5\' 4"  (1.626 m)  Wt 184 lb (83.462 kg)  BMI 31.58 kg/m2 General: Well-nourished white female in no distress, flushed appearance Neck: Normal carotid upstroke no carotid bruit. No thyromegaly nonnodular thyroid. JVP 5-6 cm Lungs: Clear breath sounds bilaterally. No wheezing Cardiac: Regular rate and rhythm normal closing click of S1 and no pathological murmurs. Normal S2. No S3 Vascular: No edema. Normal distal pulses Skin: Warm and dry Physcologic: Normal affect  12lead ECG: Single lead tracing normal sinus rhythm Limited bedside ECHO:N/A No images are attached to the encounter.   Assessment and Plan  Mitral valve disorders Patient is doing well. She reports no chest pain or shortness of breath. Mitral valve sounds normal on physical examination.  Peripheral neuropathy Patient continues to have some pain in the lower extremities and she is already on high-dose gabapentin. I suggested that she could try tramadol 100 mg by mouth twice a day when necessary pain  Encounter for long-term (current) use of anticoagulants Patient continues to be followed in the Coumadin clinic. She reports no bleeding complications.  BREAST CANCER Patient reportedly is cancer free. She receives continued followup  Fever Patient had a few days with fever. I will send her for several blood cultures x2 as well as sedimentation rate to make sure that she has no endocarditis.  Patient Active Problem List  Diagnoses  . Mitral valve disorders  . SYSTEMIC LUPUS ERYTHEMATOSUS  . BREAST CANCER  . FEVER PRESENTING CONDITIONS CLASSIFIED ELSEWHERE  . CHOLECYSTECTOMY,  LAPAROSCOPIC, HX OF  . Encounter for long-term (current) use of anticoagulants  . BRCA1 negative  . BRCA2 negative  . B12 deficiency  . 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 Peripheral neuropathy  . Insomnia  . Anticoagulation goal of INR 2.5 to 3.5  . Mitral valve replaced

## 2011-06-18 NOTE — Assessment & Plan Note (Signed)
Patient continues to be followed in the Coumadin clinic. She reports no bleeding complications.

## 2011-06-18 NOTE — Patient Instructions (Addendum)
Your physician recommends that you go to the [facility] for lab work today for blood cultures & sed rate If the results of your test are normal or stable, you will receive a letter.  If they are abnormal, the nurse will contact you by phone. Tramadol 100mg  - may use as needed, can take up to twice a day if necessary.  If this medication is helpful, future maintenance refills to come from primary MD Your physician wants you to follow up in:  1 year.  You will receive a reminder letter in the mail one-two months in advance.  If you don't receive a letter, please call our office to schedule the follow up appointment

## 2011-06-18 NOTE — Assessment & Plan Note (Signed)
Patient is doing well. She reports no chest pain or shortness of breath. Mitral valve sounds normal on physical examination.

## 2011-06-18 NOTE — Assessment & Plan Note (Signed)
Patient continues to have some pain in the lower extremities and she is already on high-dose gabapentin. I suggested that she could try tramadol 100 mg by mouth twice a day when necessary pain

## 2011-06-19 ENCOUNTER — Ambulatory Visit (HOSPITAL_COMMUNITY): Payer: PRIVATE HEALTH INSURANCE | Admitting: Oncology

## 2011-06-25 ENCOUNTER — Encounter (HOSPITAL_COMMUNITY): Payer: PRIVATE HEALTH INSURANCE | Attending: Oncology | Admitting: Oncology

## 2011-06-25 ENCOUNTER — Telehealth (HOSPITAL_COMMUNITY): Payer: Self-pay | Admitting: Oncology

## 2011-06-25 ENCOUNTER — Encounter (HOSPITAL_COMMUNITY): Payer: Self-pay | Admitting: Oncology

## 2011-06-25 VITALS — BP 109/71 | HR 97 | Temp 98.2°F | Wt 189.1 lb

## 2011-06-25 DIAGNOSIS — R141 Gas pain: Secondary | ICD-10-CM

## 2011-06-25 DIAGNOSIS — Z954 Presence of other heart-valve replacement: Secondary | ICD-10-CM | POA: Insufficient documentation

## 2011-06-25 DIAGNOSIS — M329 Systemic lupus erythematosus, unspecified: Secondary | ICD-10-CM | POA: Diagnosis not present

## 2011-06-25 DIAGNOSIS — C50219 Malignant neoplasm of upper-inner quadrant of unspecified female breast: Secondary | ICD-10-CM | POA: Diagnosis not present

## 2011-06-25 DIAGNOSIS — D591 Autoimmune hemolytic anemia, unspecified: Secondary | ICD-10-CM

## 2011-06-25 DIAGNOSIS — R143 Flatulence: Secondary | ICD-10-CM | POA: Diagnosis not present

## 2011-06-25 DIAGNOSIS — E039 Hypothyroidism, unspecified: Secondary | ICD-10-CM | POA: Insufficient documentation

## 2011-06-25 DIAGNOSIS — I509 Heart failure, unspecified: Secondary | ICD-10-CM | POA: Insufficient documentation

## 2011-06-25 DIAGNOSIS — E669 Obesity, unspecified: Secondary | ICD-10-CM | POA: Insufficient documentation

## 2011-06-25 DIAGNOSIS — C50919 Malignant neoplasm of unspecified site of unspecified female breast: Secondary | ICD-10-CM

## 2011-06-25 LAB — DIFFERENTIAL
Basophils Absolute: 0 10*3/uL (ref 0.0–0.1)
Basophils Relative: 1 % (ref 0–1)
Eosinophils Absolute: 0.2 10*3/uL (ref 0.0–0.7)
Eosinophils Relative: 4 % (ref 0–5)
Monocytes Absolute: 0.2 10*3/uL (ref 0.1–1.0)
Monocytes Relative: 4 % (ref 3–12)
Neutro Abs: 3 10*3/uL (ref 1.7–7.7)

## 2011-06-25 LAB — CBC
HCT: 32.7 % — ABNORMAL LOW (ref 36.0–46.0)
Hemoglobin: 11.1 g/dL — ABNORMAL LOW (ref 12.0–15.0)
MCH: 30.4 pg (ref 26.0–34.0)
MCHC: 33.9 g/dL (ref 30.0–36.0)
MCV: 89.6 fL (ref 78.0–100.0)
RDW: 15.4 % (ref 11.5–15.5)

## 2011-06-25 LAB — COMPREHENSIVE METABOLIC PANEL
AST: 21 U/L (ref 0–37)
Albumin: 3.5 g/dL (ref 3.5–5.2)
BUN: 8 mg/dL (ref 6–23)
Calcium: 9.1 mg/dL (ref 8.4–10.5)
Chloride: 107 mEq/L (ref 96–112)
Creatinine, Ser: 0.64 mg/dL (ref 0.50–1.10)
Total Bilirubin: 0.3 mg/dL (ref 0.3–1.2)
Total Protein: 7.7 g/dL (ref 6.0–8.3)

## 2011-06-25 NOTE — Telephone Encounter (Signed)
[facility]  ? CT AB W/O REQUIRES PRE AUTH  PER CSR DOES REQUIRE PRE AUTH. TRNSFD TO JENNIFER BENTLEY X [id number]. I ADVSD I WOULD FAX OVER CLINICALS TO HER ATTEN. [phone]

## 2011-06-25 NOTE — Progress Notes (Signed)
CC:   Corrie Mckusick, M.D. Learta Codding, MD,FACC Sandria Bales Ezzard Standing, M.D. Zenovia Jordan, MD Billie Lade, Ph.D., M.D. Etter Sjogren, M.D.  DIAGNOSES: 1. 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  No obvious     lymphadenopathy in the axilla, supraclavicular, or infraclavicular     areas.  Status post 2 cycles of FEC, then a mastectomy on     04/20/2010, which showed no evidence of disease in the lymph nodes,     but still a grade 3 cancer, 1.6 cm in size still present.  Margins     were clear.  ER negative, PR negative, HER-2/neu negative. Ki-67     marker was 92%.  LVI was not identified.  I placed her on Taxotere     with carboplatin.  She then took 1 cycle of Taxotere alone to begin     with, then I added carboplatin for the 2nd cycle.  She took a 3rd     cycle and then essentially declined any further therapy.  She had a     lot of difficulty with treatment toxicity. 2. Systemic lupus erythematosus on Plaquenil. 3. Septic episodes after cycles 1 and 2 from the [facility]. 4. 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 5. Cholecystectomy in the recent past. 6. Clostridium difficile diarrhea in the past. 7. Possible viral-induced pancreatitis prompting admission to the     hospital, but with very quick resolution after mastectomy. 8. BRCA1 and BRCA2 negativity. 9. Mitral valve replaced with a St. Jude valve for a history of     congestive heart failure. 10.Hypothyroidism. 11.Mild obesity.  She unfortunately is gaining weight. Jamillia is having basically symptoms of bloating and fullness after even a snack.  This started after her heart surgery in 2008, got worse after her diagnosis of breast cancer and chemotherapy, and has not gone away since.  She moves her bowels well, she states.  She is status post recent reconstruction of the left nipple and areola area by Dr.  Odis Luster. That seemed to go well.  She is now back on her Coumadin.  Her most recent INR was she states 3.8 on the 22nd of April and that is confirmed in the chart.  She did have an episode of fever that lasted 24 hours again, was about 102 degrees.  No associated other symptoms.  Dr. Andee Lineman wanted to be called if it did not go away and it went away spontaneously.  She has had this once or twice in the past as well.  She is not febrile today.  No urinary tract symptoms.  No shortness of breath.  She is just weak from not doing anything after her breast reconstruction surgery in December and then the most recent nipple reconstruction.  She was told to really take it easy by Dr. Odis Luster after the surgery on the 26th of December.  PHYSICAL EXAMINATION:  General:  She has stable looking, in no acute distress, slightly less cushingoid in my opinion.  Lymph:  No adenopathy in the cervical, supraclavicular, infraclavicular, axillary, or inguinal areas.  Abdomen:  There is some fullness in the left upper quadrant.  No obvious hepatomegaly.  I cannot feel her spleen distinctly in the right lateral decubitus position, but there is fullness there.  Breasts:  Her left reconstructed breast is negative.  The right reconstructed breast is negative.  She  has no masses in either breast.  No adenopathy. Heart:  Shows this click and a grade 2/6 murmur.  Lungs:  She has clear lung fields.  Skin:  Unremarkable.  So I he will get a CT of the abdomen.  I had 1 done before to rule out splenomegaly when we 1st saw her, so we will compare some apples with apples with that CT scan rather than an ultrasound.  We will get some blood work on her today to see what her blood counts are.  They are still pending.  I will see her in 6 months, sooner if need be, but she probably needs a GI consultation to make sure I am not missing something and we will set that up.    ______________________________ Ladona Horns.  Mariel Sleet, MD ESN/MEDQ  D:  06/25/2011  T:  06/25/2011  Job:  [id number]

## 2011-06-25 NOTE — Progress Notes (Signed)
This office note has been dictated.

## 2011-06-25 NOTE — Patient Instructions (Signed)
[name]  [id number] 1970/10/20 Dr. Glenford Peers   [facility]  Discharge Instructions  RECOMMENDATIONS MADE BY THE CONSULTANT AND ANY TEST RESULTS WILL BE SENT TO YOUR REFERRING DOCTOR.   EXAM FINDINGS BY MD TODAY AND SIGNS AND SYMPTOMS TO REPORT TO CLINIC OR PRIMARY MD: Exam and discussion per MD.   Will do CT scan of your abdomen and get a GI consult with Dr. Karilyn Cota.  MEDICATIONS PRESCRIBED: none   INSTRUCTIONS GIVEN AND DISCUSSED: Other :  Report any new lumps, bone pain or shortness of breath.  SPECIAL INSTRUCTIONS/FOLLOW-UP: Lab work Needed today, Xray Studies Needed this week and Return to Clinic for follow-up in 6 months.   I acknowledge that I have been informed and understand all the instructions given to me and received a copy. I do not have any more questions at this time, but understand that I may call the Specialty Clinic at [facility] at [phone] during business hours should I have any further questions or need assistance in obtaining follow-up care.    __________________________________________  _____________  __________ Signature of Patient or Authorized Representative            Date                   Time    __________________________________________ Nurse's Signature

## 2011-06-25 NOTE — Progress Notes (Signed)
[name] presented for [employer]. Labs per MD order drawn via Peripheral Line 23 gauge needle inserted in right AC  Good blood return present. Procedure without incident.  Needle removed intact. Patient tolerated procedure well.

## 2011-06-26 ENCOUNTER — Ambulatory Visit (HOSPITAL_COMMUNITY)
Admission: RE | Admit: 2011-06-26 | Discharge: 2011-06-26 | Disposition: A | Discharge status: Home / Self Care | Payer: PRIVATE HEALTH INSURANCE | Source: Ambulatory Visit | Attending: Oncology | Admitting: Oncology

## 2011-06-26 ENCOUNTER — Encounter: Payer: Self-pay | Admitting: *Deleted

## 2011-06-26 ENCOUNTER — Encounter (HOSPITAL_COMMUNITY): Payer: Self-pay

## 2011-06-26 DIAGNOSIS — D594 Other nonautoimmune hemolytic anemias: Secondary | ICD-10-CM | POA: Insufficient documentation

## 2011-06-26 DIAGNOSIS — R143 Flatulence: Secondary | ICD-10-CM | POA: Diagnosis not present

## 2011-06-26 DIAGNOSIS — C50919 Malignant neoplasm of unspecified site of unspecified female breast: Secondary | ICD-10-CM | POA: Insufficient documentation

## 2011-06-26 DIAGNOSIS — R161 Splenomegaly, not elsewhere classified: Secondary | ICD-10-CM | POA: Diagnosis not present

## 2011-06-26 DIAGNOSIS — R142 Eructation: Secondary | ICD-10-CM | POA: Diagnosis not present

## 2011-06-26 DIAGNOSIS — R141 Gas pain: Secondary | ICD-10-CM | POA: Insufficient documentation

## 2011-06-26 DIAGNOSIS — Z853 Personal history of malignant neoplasm of breast: Secondary | ICD-10-CM | POA: Diagnosis not present

## 2011-06-26 DIAGNOSIS — R6881 Early satiety: Secondary | ICD-10-CM | POA: Diagnosis not present

## 2011-06-26 DIAGNOSIS — M329 Systemic lupus erythematosus, unspecified: Secondary | ICD-10-CM | POA: Diagnosis not present

## 2011-06-27 ENCOUNTER — Ambulatory Visit (INDEPENDENT_AMBULATORY_CARE_PROVIDER_SITE_OTHER): Payer: PRIVATE HEALTH INSURANCE | Admitting: *Deleted

## 2011-06-27 ENCOUNTER — Other Ambulatory Visit (HOSPITAL_COMMUNITY): Payer: Self-pay | Admitting: Oncology

## 2011-06-27 DIAGNOSIS — Z954 Presence of other heart-valve replacement: Secondary | ICD-10-CM

## 2011-06-27 DIAGNOSIS — Z7901 Long term (current) use of anticoagulants: Secondary | ICD-10-CM

## 2011-06-27 DIAGNOSIS — I059 Rheumatic mitral valve disease, unspecified: Secondary | ICD-10-CM | POA: Diagnosis not present

## 2011-06-27 DIAGNOSIS — C50919 Malignant neoplasm of unspecified site of unspecified female breast: Secondary | ICD-10-CM

## 2011-06-27 LAB — POCT INR: INR: 3.1

## 2011-06-28 ENCOUNTER — Encounter (INDEPENDENT_AMBULATORY_CARE_PROVIDER_SITE_OTHER): Payer: Self-pay | Admitting: *Deleted

## 2011-07-02 ENCOUNTER — Telehealth (HOSPITAL_COMMUNITY): Payer: Self-pay

## 2011-07-02 NOTE — Telephone Encounter (Signed)
Call from patient.  States "I had a bone scan done a year or so ago and remember Dr. Mariel Sleet talking about something showing up on my right leg above my right knee.  At the time he thought it was related to my lupus.  I wondered if the aching that I have in my right leg and knee is related to that?"

## 2011-07-10 ENCOUNTER — Other Ambulatory Visit (HOSPITAL_COMMUNITY): Payer: Self-pay

## 2011-07-10 ENCOUNTER — Telehealth (HOSPITAL_COMMUNITY): Payer: Self-pay

## 2011-07-10 ENCOUNTER — Ambulatory Visit (HOSPITAL_COMMUNITY)
Admission: RE | Admit: 2011-07-10 | Discharge: 2011-07-10 | Disposition: A | Discharge status: Home / Self Care | Payer: PRIVATE HEALTH INSURANCE | Source: Ambulatory Visit | Attending: Oncology | Admitting: Oncology

## 2011-07-10 DIAGNOSIS — C50919 Malignant neoplasm of unspecified site of unspecified female breast: Secondary | ICD-10-CM

## 2011-07-10 DIAGNOSIS — R52 Pain, unspecified: Secondary | ICD-10-CM

## 2011-07-10 DIAGNOSIS — M79609 Pain in unspecified limb: Secondary | ICD-10-CM | POA: Diagnosis not present

## 2011-07-10 DIAGNOSIS — M329 Systemic lupus erythematosus, unspecified: Secondary | ICD-10-CM | POA: Insufficient documentation

## 2011-07-10 DIAGNOSIS — M25559 Pain in unspecified hip: Secondary | ICD-10-CM | POA: Diagnosis not present

## 2011-07-10 NOTE — Telephone Encounter (Signed)
Message copied by Sterling Big on Tue Jul 10, 2011  5:18 PM ------      Message from: Mariel Sleet, ERIC S      Created: Tue Jul 10, 2011  4:52 PM       The plain films are stable but there is something potentially there      If agreeable we'll do mri with and without contrast      Verify she is symptomatic at the knee before we do.

## 2011-07-10 NOTE — Telephone Encounter (Signed)
States that the leg aches and is worse at night.  Is agreeable to do MRI.

## 2011-07-11 ENCOUNTER — Other Ambulatory Visit (HOSPITAL_COMMUNITY): Payer: Self-pay | Admitting: Oncology

## 2011-07-11 ENCOUNTER — Telehealth: Payer: Self-pay | Admitting: *Deleted

## 2011-07-11 DIAGNOSIS — M25569 Pain in unspecified knee: Secondary | ICD-10-CM

## 2011-07-11 NOTE — Telephone Encounter (Signed)
Received call from Tobie Lords w/ [facility] - wanting to know if okay for patient to have MRI of leg since she has mitral valve replacement.    Call placed to patient - advised her to look at card as it has info on what is okay or not.  She also may have to check with radiologist because they will be able to tell her specifics about type of unit they are using.    Patient verbalized understanding.

## 2011-07-17 ENCOUNTER — Telehealth (HOSPITAL_COMMUNITY): Payer: Self-pay | Admitting: Oncology

## 2011-07-17 NOTE — Telephone Encounter (Signed)
[facility]-[id number] JENNIFER-NURSE OBTAINED PRE AUTH FOR MRI LOWER EXTR CPT [id number] AUTH#[id number] GOOD FOR 07/18/11 ONLY

## 2011-07-17 NOTE — Telephone Encounter (Signed)
[facility]-

## 2011-07-17 NOTE — Telephone Encounter (Signed)
[facility]-[phone] CSR/JENNIFER MRI RT [id number] AUTH#[id number]

## 2011-07-18 ENCOUNTER — Ambulatory Visit (HOSPITAL_COMMUNITY)
Admission: RE | Admit: 2011-07-18 | Discharge: 2011-07-18 | Disposition: A | Discharge status: Home / Self Care | Payer: PRIVATE HEALTH INSURANCE | Source: Ambulatory Visit | Attending: Oncology | Admitting: Oncology

## 2011-07-18 ENCOUNTER — Ambulatory Visit (INDEPENDENT_AMBULATORY_CARE_PROVIDER_SITE_OTHER): Payer: PRIVATE HEALTH INSURANCE | Admitting: *Deleted

## 2011-07-18 DIAGNOSIS — Z7901 Long term (current) use of anticoagulants: Secondary | ICD-10-CM

## 2011-07-18 DIAGNOSIS — Z954 Presence of other heart-valve replacement: Secondary | ICD-10-CM

## 2011-07-18 DIAGNOSIS — I059 Rheumatic mitral valve disease, unspecified: Secondary | ICD-10-CM | POA: Diagnosis not present

## 2011-07-18 DIAGNOSIS — C50919 Malignant neoplasm of unspecified site of unspecified female breast: Secondary | ICD-10-CM | POA: Diagnosis not present

## 2011-07-18 DIAGNOSIS — M25569 Pain in unspecified knee: Secondary | ICD-10-CM | POA: Diagnosis not present

## 2011-07-18 DIAGNOSIS — M87059 Idiopathic aseptic necrosis of unspecified femur: Secondary | ICD-10-CM | POA: Insufficient documentation

## 2011-07-18 DIAGNOSIS — M25469 Effusion, unspecified knee: Secondary | ICD-10-CM | POA: Diagnosis not present

## 2011-07-18 LAB — POCT INR: INR: 3.9

## 2011-07-18 MED ORDER — GADOBENATE DIMEGLUMINE 529 MG/ML IV SOLN
15.0000 mL | Freq: Once | INTRAVENOUS | Status: AC | PRN
  Administered 2011-07-18: 15 mL via INTRAVENOUS

## 2011-07-31 ENCOUNTER — Ambulatory Visit (INDEPENDENT_AMBULATORY_CARE_PROVIDER_SITE_OTHER): Payer: PRIVATE HEALTH INSURANCE | Admitting: Internal Medicine

## 2011-08-08 DIAGNOSIS — L600 Ingrowing nail: Secondary | ICD-10-CM | POA: Diagnosis not present

## 2011-08-08 DIAGNOSIS — M79609 Pain in unspecified limb: Secondary | ICD-10-CM | POA: Diagnosis not present

## 2011-08-09 ENCOUNTER — Ambulatory Visit (INDEPENDENT_AMBULATORY_CARE_PROVIDER_SITE_OTHER): Payer: PRIVATE HEALTH INSURANCE | Admitting: *Deleted

## 2011-08-09 DIAGNOSIS — I059 Rheumatic mitral valve disease, unspecified: Secondary | ICD-10-CM

## 2011-08-09 DIAGNOSIS — Z954 Presence of other heart-valve replacement: Secondary | ICD-10-CM | POA: Diagnosis not present

## 2011-08-09 DIAGNOSIS — Z7901 Long term (current) use of anticoagulants: Secondary | ICD-10-CM

## 2011-08-09 LAB — POCT INR: INR: 1.3

## 2011-08-13 ENCOUNTER — Ambulatory Visit (INDEPENDENT_AMBULATORY_CARE_PROVIDER_SITE_OTHER): Payer: PRIVATE HEALTH INSURANCE | Admitting: *Deleted

## 2011-08-13 DIAGNOSIS — Z7901 Long term (current) use of anticoagulants: Secondary | ICD-10-CM

## 2011-08-13 DIAGNOSIS — Z954 Presence of other heart-valve replacement: Secondary | ICD-10-CM

## 2011-08-13 DIAGNOSIS — I059 Rheumatic mitral valve disease, unspecified: Secondary | ICD-10-CM | POA: Diagnosis not present

## 2011-08-16 ENCOUNTER — Other Ambulatory Visit: Payer: Self-pay | Admitting: Cardiology

## 2011-08-21 DIAGNOSIS — E039 Hypothyroidism, unspecified: Secondary | ICD-10-CM | POA: Diagnosis not present

## 2011-08-21 DIAGNOSIS — Z6832 Body mass index (BMI) 32.0-32.9, adult: Secondary | ICD-10-CM | POA: Diagnosis not present

## 2011-08-21 DIAGNOSIS — I10 Essential (primary) hypertension: Secondary | ICD-10-CM | POA: Diagnosis not present

## 2011-08-21 DIAGNOSIS — E785 Hyperlipidemia, unspecified: Secondary | ICD-10-CM | POA: Diagnosis not present

## 2011-08-29 DIAGNOSIS — L600 Ingrowing nail: Secondary | ICD-10-CM | POA: Diagnosis not present

## 2011-08-29 DIAGNOSIS — M79609 Pain in unspecified limb: Secondary | ICD-10-CM | POA: Diagnosis not present

## 2011-08-30 ENCOUNTER — Ambulatory Visit (INDEPENDENT_AMBULATORY_CARE_PROVIDER_SITE_OTHER): Payer: PRIVATE HEALTH INSURANCE | Admitting: *Deleted

## 2011-08-30 DIAGNOSIS — I059 Rheumatic mitral valve disease, unspecified: Secondary | ICD-10-CM

## 2011-08-30 DIAGNOSIS — Z954 Presence of other heart-valve replacement: Secondary | ICD-10-CM | POA: Diagnosis not present

## 2011-08-30 DIAGNOSIS — Z7901 Long term (current) use of anticoagulants: Secondary | ICD-10-CM

## 2011-08-30 LAB — POCT INR: INR: 1.6

## 2011-09-03 ENCOUNTER — Ambulatory Visit (INDEPENDENT_AMBULATORY_CARE_PROVIDER_SITE_OTHER): Payer: PRIVATE HEALTH INSURANCE | Admitting: *Deleted

## 2011-09-03 DIAGNOSIS — Z954 Presence of other heart-valve replacement: Secondary | ICD-10-CM

## 2011-09-03 DIAGNOSIS — I059 Rheumatic mitral valve disease, unspecified: Secondary | ICD-10-CM

## 2011-09-03 DIAGNOSIS — Z7901 Long term (current) use of anticoagulants: Secondary | ICD-10-CM | POA: Diagnosis not present

## 2011-09-13 ENCOUNTER — Ambulatory Visit (INDEPENDENT_AMBULATORY_CARE_PROVIDER_SITE_OTHER): Payer: PRIVATE HEALTH INSURANCE | Admitting: *Deleted

## 2011-09-13 DIAGNOSIS — Z7901 Long term (current) use of anticoagulants: Secondary | ICD-10-CM

## 2011-09-13 DIAGNOSIS — I059 Rheumatic mitral valve disease, unspecified: Secondary | ICD-10-CM | POA: Diagnosis not present

## 2011-09-13 DIAGNOSIS — Z954 Presence of other heart-valve replacement: Secondary | ICD-10-CM

## 2011-09-13 DIAGNOSIS — M2550 Pain in unspecified joint: Secondary | ICD-10-CM | POA: Diagnosis not present

## 2011-09-13 DIAGNOSIS — I7300 Raynaud's syndrome without gangrene: Secondary | ICD-10-CM | POA: Diagnosis not present

## 2011-09-13 DIAGNOSIS — G609 Hereditary and idiopathic neuropathy, unspecified: Secondary | ICD-10-CM | POA: Diagnosis not present

## 2011-09-13 DIAGNOSIS — M329 Systemic lupus erythematosus, unspecified: Secondary | ICD-10-CM | POA: Diagnosis not present

## 2011-09-13 DIAGNOSIS — Z79899 Other long term (current) drug therapy: Secondary | ICD-10-CM | POA: Diagnosis not present

## 2011-09-13 DIAGNOSIS — R21 Rash and other nonspecific skin eruption: Secondary | ICD-10-CM | POA: Diagnosis not present

## 2011-09-13 DIAGNOSIS — D696 Thrombocytopenia, unspecified: Secondary | ICD-10-CM | POA: Diagnosis not present

## 2011-09-24 ENCOUNTER — Ambulatory Visit (INDEPENDENT_AMBULATORY_CARE_PROVIDER_SITE_OTHER): Payer: PRIVATE HEALTH INSURANCE | Admitting: *Deleted

## 2011-09-24 DIAGNOSIS — Z7901 Long term (current) use of anticoagulants: Secondary | ICD-10-CM

## 2011-09-24 DIAGNOSIS — I059 Rheumatic mitral valve disease, unspecified: Secondary | ICD-10-CM | POA: Diagnosis not present

## 2011-09-24 DIAGNOSIS — Z954 Presence of other heart-valve replacement: Secondary | ICD-10-CM | POA: Diagnosis not present

## 2011-10-08 ENCOUNTER — Ambulatory Visit (INDEPENDENT_AMBULATORY_CARE_PROVIDER_SITE_OTHER): Payer: PRIVATE HEALTH INSURANCE | Admitting: *Deleted

## 2011-10-08 DIAGNOSIS — Z6832 Body mass index (BMI) 32.0-32.9, adult: Secondary | ICD-10-CM | POA: Diagnosis not present

## 2011-10-08 DIAGNOSIS — I059 Rheumatic mitral valve disease, unspecified: Secondary | ICD-10-CM | POA: Diagnosis not present

## 2011-10-08 DIAGNOSIS — Z7901 Long term (current) use of anticoagulants: Secondary | ICD-10-CM

## 2011-10-08 DIAGNOSIS — Z954 Presence of other heart-valve replacement: Secondary | ICD-10-CM | POA: Diagnosis not present

## 2011-10-08 DIAGNOSIS — E039 Hypothyroidism, unspecified: Secondary | ICD-10-CM | POA: Diagnosis not present

## 2011-10-08 LAB — POCT INR: INR: 4.8

## 2011-10-24 DIAGNOSIS — M79609 Pain in unspecified limb: Secondary | ICD-10-CM | POA: Diagnosis not present

## 2011-10-24 DIAGNOSIS — L600 Ingrowing nail: Secondary | ICD-10-CM | POA: Diagnosis not present

## 2011-10-25 ENCOUNTER — Ambulatory Visit (INDEPENDENT_AMBULATORY_CARE_PROVIDER_SITE_OTHER): Payer: PRIVATE HEALTH INSURANCE | Admitting: *Deleted

## 2011-10-25 DIAGNOSIS — I059 Rheumatic mitral valve disease, unspecified: Secondary | ICD-10-CM | POA: Diagnosis not present

## 2011-10-25 DIAGNOSIS — Z7901 Long term (current) use of anticoagulants: Secondary | ICD-10-CM | POA: Diagnosis not present

## 2011-10-25 DIAGNOSIS — Z954 Presence of other heart-valve replacement: Secondary | ICD-10-CM | POA: Diagnosis not present

## 2011-10-31 DIAGNOSIS — M064 Inflammatory polyarthropathy: Secondary | ICD-10-CM | POA: Diagnosis not present

## 2011-10-31 DIAGNOSIS — M329 Systemic lupus erythematosus, unspecified: Secondary | ICD-10-CM | POA: Diagnosis not present

## 2011-10-31 DIAGNOSIS — I7300 Raynaud's syndrome without gangrene: Secondary | ICD-10-CM | POA: Diagnosis not present

## 2011-10-31 DIAGNOSIS — M2550 Pain in unspecified joint: Secondary | ICD-10-CM | POA: Diagnosis not present

## 2011-10-31 DIAGNOSIS — D696 Thrombocytopenia, unspecified: Secondary | ICD-10-CM | POA: Diagnosis not present

## 2011-10-31 DIAGNOSIS — Z79899 Other long term (current) drug therapy: Secondary | ICD-10-CM | POA: Diagnosis not present

## 2011-11-01 ENCOUNTER — Ambulatory Visit (INDEPENDENT_AMBULATORY_CARE_PROVIDER_SITE_OTHER): Payer: PRIVATE HEALTH INSURANCE | Admitting: *Deleted

## 2011-11-01 DIAGNOSIS — Z954 Presence of other heart-valve replacement: Secondary | ICD-10-CM | POA: Diagnosis not present

## 2011-11-01 DIAGNOSIS — Z7901 Long term (current) use of anticoagulants: Secondary | ICD-10-CM | POA: Diagnosis not present

## 2011-11-01 DIAGNOSIS — I059 Rheumatic mitral valve disease, unspecified: Secondary | ICD-10-CM

## 2011-11-15 ENCOUNTER — Ambulatory Visit (INDEPENDENT_AMBULATORY_CARE_PROVIDER_SITE_OTHER): Payer: PRIVATE HEALTH INSURANCE | Admitting: *Deleted

## 2011-11-15 DIAGNOSIS — Z954 Presence of other heart-valve replacement: Secondary | ICD-10-CM

## 2011-11-15 DIAGNOSIS — I059 Rheumatic mitral valve disease, unspecified: Secondary | ICD-10-CM

## 2011-11-15 DIAGNOSIS — Z7901 Long term (current) use of anticoagulants: Secondary | ICD-10-CM

## 2011-11-15 LAB — POCT INR: INR: 3.1

## 2011-11-15 MED ORDER — WARFARIN SODIUM 5 MG PO TABS: ORAL_TABLET | ORAL | Status: DC

## 2011-11-19 DIAGNOSIS — E039 Hypothyroidism, unspecified: Secondary | ICD-10-CM | POA: Diagnosis not present

## 2011-11-19 DIAGNOSIS — Z6831 Body mass index (BMI) 31.0-31.9, adult: Secondary | ICD-10-CM | POA: Diagnosis not present

## 2011-11-19 DIAGNOSIS — Z23 Encounter for immunization: Secondary | ICD-10-CM | POA: Diagnosis not present

## 2011-12-04 DIAGNOSIS — L600 Ingrowing nail: Secondary | ICD-10-CM | POA: Diagnosis not present

## 2011-12-04 DIAGNOSIS — M79609 Pain in unspecified limb: Secondary | ICD-10-CM | POA: Diagnosis not present

## 2011-12-06 ENCOUNTER — Ambulatory Visit (INDEPENDENT_AMBULATORY_CARE_PROVIDER_SITE_OTHER): Payer: PRIVATE HEALTH INSURANCE | Admitting: *Deleted

## 2011-12-06 DIAGNOSIS — Z7901 Long term (current) use of anticoagulants: Secondary | ICD-10-CM

## 2011-12-06 DIAGNOSIS — Z954 Presence of other heart-valve replacement: Secondary | ICD-10-CM | POA: Diagnosis not present

## 2011-12-06 DIAGNOSIS — I059 Rheumatic mitral valve disease, unspecified: Secondary | ICD-10-CM

## 2011-12-06 LAB — POCT INR: INR: 3.2

## 2011-12-13 DIAGNOSIS — Z79899 Other long term (current) drug therapy: Secondary | ICD-10-CM | POA: Diagnosis not present

## 2011-12-13 DIAGNOSIS — I7300 Raynaud's syndrome without gangrene: Secondary | ICD-10-CM | POA: Diagnosis not present

## 2011-12-13 DIAGNOSIS — M064 Inflammatory polyarthropathy: Secondary | ICD-10-CM | POA: Diagnosis not present

## 2011-12-13 DIAGNOSIS — M2550 Pain in unspecified joint: Secondary | ICD-10-CM | POA: Diagnosis not present

## 2011-12-13 DIAGNOSIS — M329 Systemic lupus erythematosus, unspecified: Secondary | ICD-10-CM | POA: Diagnosis not present

## 2011-12-26 ENCOUNTER — Encounter (HOSPITAL_COMMUNITY): Payer: PRIVATE HEALTH INSURANCE | Attending: Oncology | Admitting: Oncology

## 2011-12-26 ENCOUNTER — Encounter (HOSPITAL_COMMUNITY): Payer: Self-pay | Admitting: Oncology

## 2011-12-26 VITALS — BP 99/67 | HR 89 | Temp 97.7°F | Resp 16 | Wt 184.3 lb

## 2011-12-26 DIAGNOSIS — D591 Autoimmune hemolytic anemia, unspecified: Secondary | ICD-10-CM

## 2011-12-26 DIAGNOSIS — C50919 Malignant neoplasm of unspecified site of unspecified female breast: Secondary | ICD-10-CM

## 2011-12-26 DIAGNOSIS — C50219 Malignant neoplasm of upper-inner quadrant of unspecified female breast: Secondary | ICD-10-CM

## 2011-12-26 NOTE — Patient Instructions (Addendum)
[facility]  Discharge Instructions  RECOMMENDATIONS MADE BY THE CONSULTANT AND ANY TEST RESULTS WILL BE SENT TO YOUR REFERRING DOCTOR.   EXAM FINDINGS BY MD TODAY AND SIGNS AND SYMPTOMS TO REPORT TO CLINIC OR PRIMARY MD: exam and discussion by MD.  Bonita Quin are doing well.  MEDICATIONS PRESCRIBED: none   INSTRUCTIONS GIVEN AND DISCUSSED: Other :  Report any new lumps, bone pain or shortness of breath.  SPECIAL INSTRUCTIONS/FOLLOW-UP: Lab work Needed with Dr. Nickola Major - we need CMET drawn and Return to Clinic in 6 months for follow-up.   I acknowledge that I have been informed and understand all the instructions given to me and received a copy. I do not have any more questions at this time, but understand that I may call the Specialty Clinic at [facility] at [phone] during business hours should I have any further questions or need assistance in obtaining follow-up care.    __________________________________________  _____________  __________ Signature of Patient or Authorized Representative            Date                   Time    __________________________________________ Nurse's Signature

## 2011-12-26 NOTE — Progress Notes (Signed)
Problem #1 triple negative left-sided breast cancer 3.8 cm in size mass in the left upper inner quadrant. She has no obvious adenopathy and axilla, supraclavicular, or infraclavicular areas. She took 2 cycles of FEC followed by mastectomy on 04/20/2010. This showed no disease in the lymph nodes but she still has a 1.6 cm cancer, grade 3. Margins are clear. It was ER negative, PR negative, HER-2/neu negative, KS etc. marker high at 92% with LV I not identified. I then treated her with Taxotere and carboplatin for 4 cycles. She had difficulty tolerating the chemotherapy and received therefore a total of 6 cycles. Problem #2 systemic lupus erythematosus on Plaquenil and methotrexate. 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 difficile diarrhea in the past Problem #6 cholecystectomy in the past Problem #7 possible viral-induced pancreatitis after her mastectomy Problem #8 mitral valve replacement with a St. Jude's valve years ago at [facility] with a history of CHF on chronic anti-coagulation Problem #9 hypothyroidism Problem #10 left partial breast reconstruction Problem #11 BRCA1 and BRCA2 negativity She is doing well for the most part though she is not perfectly controlled with her SLE from the Plaquenil only so Dr. Juanetta Gosling has just added methotrexate. She has cold hands with Raynaud's phenomenon and was also just started amlodipine. She is not aware of any lumps or bumps anywhere. She has a rash across her nose and on her cheeks consistent with her SLE. Lungs are clear. Red hands and fingers are noted. She has no lymphadenopathy in any area. The reconstructed breast is negative. The right breast is negative. Heart shows a systolic click consistent with her replacement valves. There is no S3 gallop. Abdomen is obese but without organomegaly. Legs are without edema arms are  without edema.  I have sent a note with the patient to Dr. Nickola Major for blood work when she is next due for her blood counts. Otherwise I think the patient is doing well I will see her in 6 months

## 2011-12-27 ENCOUNTER — Ambulatory Visit (INDEPENDENT_AMBULATORY_CARE_PROVIDER_SITE_OTHER): Payer: PRIVATE HEALTH INSURANCE | Admitting: *Deleted

## 2011-12-27 DIAGNOSIS — Z954 Presence of other heart-valve replacement: Secondary | ICD-10-CM

## 2011-12-27 DIAGNOSIS — I059 Rheumatic mitral valve disease, unspecified: Secondary | ICD-10-CM | POA: Diagnosis not present

## 2011-12-27 DIAGNOSIS — Z7901 Long term (current) use of anticoagulants: Secondary | ICD-10-CM | POA: Diagnosis not present

## 2011-12-27 LAB — POCT INR: INR: 3.9

## 2012-01-01 DIAGNOSIS — M329 Systemic lupus erythematosus, unspecified: Secondary | ICD-10-CM | POA: Diagnosis not present

## 2012-01-03 ENCOUNTER — Other Ambulatory Visit (HOSPITAL_COMMUNITY): Payer: Self-pay | Admitting: Family Medicine

## 2012-01-03 DIAGNOSIS — G8929 Other chronic pain: Secondary | ICD-10-CM | POA: Diagnosis not present

## 2012-01-03 DIAGNOSIS — Z139 Encounter for screening, unspecified: Secondary | ICD-10-CM

## 2012-01-03 DIAGNOSIS — I10 Essential (primary) hypertension: Secondary | ICD-10-CM | POA: Diagnosis not present

## 2012-01-03 DIAGNOSIS — E785 Hyperlipidemia, unspecified: Secondary | ICD-10-CM | POA: Diagnosis not present

## 2012-01-07 ENCOUNTER — Ambulatory Visit (HOSPITAL_COMMUNITY)
Admission: RE | Admit: 2012-01-07 | Discharge: 2012-01-07 | Disposition: A | Discharge status: Home / Self Care | Payer: PRIVATE HEALTH INSURANCE | Source: Ambulatory Visit | Attending: Family Medicine | Admitting: Family Medicine

## 2012-01-07 DIAGNOSIS — Z1231 Encounter for screening mammogram for malignant neoplasm of breast: Secondary | ICD-10-CM | POA: Insufficient documentation

## 2012-01-07 DIAGNOSIS — Z139 Encounter for screening, unspecified: Secondary | ICD-10-CM

## 2012-01-09 ENCOUNTER — Other Ambulatory Visit: Payer: Self-pay | Admitting: Family Medicine

## 2012-01-09 ENCOUNTER — Other Ambulatory Visit (HOSPITAL_COMMUNITY): Payer: Self-pay | Admitting: Family Medicine

## 2012-01-09 ENCOUNTER — Other Ambulatory Visit (HOSPITAL_COMMUNITY): Payer: Self-pay | Admitting: Oncology

## 2012-01-09 DIAGNOSIS — R928 Other abnormal and inconclusive findings on diagnostic imaging of breast: Secondary | ICD-10-CM

## 2012-01-16 ENCOUNTER — Inpatient Hospital Stay (HOSPITAL_COMMUNITY): Admission: RE | Admit: 2012-01-16 | Payer: Medicare Other | Source: Ambulatory Visit

## 2012-01-16 ENCOUNTER — Ambulatory Visit (INDEPENDENT_AMBULATORY_CARE_PROVIDER_SITE_OTHER): Payer: PRIVATE HEALTH INSURANCE | Admitting: *Deleted

## 2012-01-16 DIAGNOSIS — I059 Rheumatic mitral valve disease, unspecified: Secondary | ICD-10-CM | POA: Diagnosis not present

## 2012-01-16 DIAGNOSIS — Z954 Presence of other heart-valve replacement: Secondary | ICD-10-CM

## 2012-01-16 DIAGNOSIS — Z7901 Long term (current) use of anticoagulants: Secondary | ICD-10-CM | POA: Diagnosis not present

## 2012-01-16 MED ORDER — ENOXAPARIN SODIUM 80 MG/0.8ML ~~LOC~~ SOLN: 80.0000 mg | Freq: Two times a day (BID) | SUBCUTANEOUS | Status: DC

## 2012-01-16 NOTE — Patient Instructions (Addendum)
11/29  Take last dose of coumadin  1130  No lovenox or coumadin 12/1  lovenox 80mg  SQ 7am & 7pm 12/2   lovenox 80mg  SQ 7am & 7pm 12/3   lovenox 80mg  SQ 7am & 7pm 12/4  No lovenox am  -  Surgery -- coumadin 10mg  & lovenox 80mg  7pm 12/5   lovenox 80mg  SQ 7am & coumadin 10mg  & lovenox 80mg  7pm 12/6   lovenox 80mg  SQ 7am & coumadin 10mg  & lovenox 80mg  7pm 12/7   lovenox 80mg  SQ 7am & coumadin 10mg  & lovenox 80mg  7pm 12/8   lovenox 80mg  SQ 7am & coumadin 10mg  & lovenox 80mg  7pm 12/9  Lovenox 80mg  SQ 7am   INR check 2:30pm

## 2012-01-24 DIAGNOSIS — I7300 Raynaud's syndrome without gangrene: Secondary | ICD-10-CM | POA: Diagnosis not present

## 2012-01-24 DIAGNOSIS — D696 Thrombocytopenia, unspecified: Secondary | ICD-10-CM | POA: Diagnosis not present

## 2012-01-24 DIAGNOSIS — G609 Hereditary and idiopathic neuropathy, unspecified: Secondary | ICD-10-CM | POA: Diagnosis not present

## 2012-01-24 DIAGNOSIS — M329 Systemic lupus erythematosus, unspecified: Secondary | ICD-10-CM | POA: Diagnosis not present

## 2012-01-24 DIAGNOSIS — M2550 Pain in unspecified joint: Secondary | ICD-10-CM | POA: Diagnosis not present

## 2012-01-24 DIAGNOSIS — Z79899 Other long term (current) drug therapy: Secondary | ICD-10-CM | POA: Diagnosis not present

## 2012-01-24 DIAGNOSIS — E039 Hypothyroidism, unspecified: Secondary | ICD-10-CM | POA: Diagnosis not present

## 2012-01-28 ENCOUNTER — Ambulatory Visit (INDEPENDENT_AMBULATORY_CARE_PROVIDER_SITE_OTHER): Payer: PRIVATE HEALTH INSURANCE | Admitting: *Deleted

## 2012-01-28 DIAGNOSIS — Z7901 Long term (current) use of anticoagulants: Secondary | ICD-10-CM

## 2012-01-28 DIAGNOSIS — Z954 Presence of other heart-valve replacement: Secondary | ICD-10-CM

## 2012-01-28 DIAGNOSIS — I059 Rheumatic mitral valve disease, unspecified: Secondary | ICD-10-CM

## 2012-01-28 LAB — POCT INR: INR: 3.5

## 2012-02-11 ENCOUNTER — Ambulatory Visit (INDEPENDENT_AMBULATORY_CARE_PROVIDER_SITE_OTHER): Payer: PRIVATE HEALTH INSURANCE | Admitting: *Deleted

## 2012-02-11 DIAGNOSIS — Z954 Presence of other heart-valve replacement: Secondary | ICD-10-CM

## 2012-02-11 DIAGNOSIS — I059 Rheumatic mitral valve disease, unspecified: Secondary | ICD-10-CM | POA: Diagnosis not present

## 2012-02-11 DIAGNOSIS — Z7901 Long term (current) use of anticoagulants: Secondary | ICD-10-CM | POA: Diagnosis not present

## 2012-02-11 LAB — POCT INR: INR: 3.8

## 2012-02-25 DIAGNOSIS — Z6830 Body mass index (BMI) 30.0-30.9, adult: Secondary | ICD-10-CM | POA: Diagnosis not present

## 2012-02-25 DIAGNOSIS — I10 Essential (primary) hypertension: Secondary | ICD-10-CM | POA: Diagnosis not present

## 2012-02-25 DIAGNOSIS — E039 Hypothyroidism, unspecified: Secondary | ICD-10-CM | POA: Diagnosis not present

## 2012-02-25 DIAGNOSIS — E785 Hyperlipidemia, unspecified: Secondary | ICD-10-CM | POA: Diagnosis not present

## 2012-03-03 ENCOUNTER — Ambulatory Visit (INDEPENDENT_AMBULATORY_CARE_PROVIDER_SITE_OTHER): Payer: BC Managed Care – PPO | Admitting: *Deleted

## 2012-03-03 DIAGNOSIS — Z954 Presence of other heart-valve replacement: Secondary | ICD-10-CM | POA: Diagnosis not present

## 2012-03-03 DIAGNOSIS — Z7901 Long term (current) use of anticoagulants: Secondary | ICD-10-CM | POA: Diagnosis not present

## 2012-03-03 DIAGNOSIS — I059 Rheumatic mitral valve disease, unspecified: Secondary | ICD-10-CM

## 2012-03-03 LAB — POCT INR: INR: 1.9

## 2012-03-13 ENCOUNTER — Ambulatory Visit (INDEPENDENT_AMBULATORY_CARE_PROVIDER_SITE_OTHER): Payer: BC Managed Care – PPO | Admitting: *Deleted

## 2012-03-13 DIAGNOSIS — Z954 Presence of other heart-valve replacement: Secondary | ICD-10-CM

## 2012-03-13 DIAGNOSIS — I059 Rheumatic mitral valve disease, unspecified: Secondary | ICD-10-CM | POA: Diagnosis not present

## 2012-03-13 DIAGNOSIS — Z7901 Long term (current) use of anticoagulants: Secondary | ICD-10-CM | POA: Diagnosis not present

## 2012-03-13 LAB — POCT INR: INR: 4.3

## 2012-03-27 ENCOUNTER — Ambulatory Visit (INDEPENDENT_AMBULATORY_CARE_PROVIDER_SITE_OTHER): Payer: BC Managed Care – PPO | Admitting: *Deleted

## 2012-03-27 DIAGNOSIS — Z7901 Long term (current) use of anticoagulants: Secondary | ICD-10-CM

## 2012-03-27 DIAGNOSIS — I059 Rheumatic mitral valve disease, unspecified: Secondary | ICD-10-CM | POA: Diagnosis not present

## 2012-03-27 DIAGNOSIS — Z954 Presence of other heart-valve replacement: Secondary | ICD-10-CM

## 2012-04-05 ENCOUNTER — Other Ambulatory Visit: Payer: Self-pay

## 2012-04-07 ENCOUNTER — Ambulatory Visit (INDEPENDENT_AMBULATORY_CARE_PROVIDER_SITE_OTHER): Payer: BC Managed Care – PPO | Admitting: *Deleted

## 2012-04-07 DIAGNOSIS — Z7901 Long term (current) use of anticoagulants: Secondary | ICD-10-CM

## 2012-04-07 DIAGNOSIS — I059 Rheumatic mitral valve disease, unspecified: Secondary | ICD-10-CM

## 2012-04-07 DIAGNOSIS — Z954 Presence of other heart-valve replacement: Secondary | ICD-10-CM

## 2012-04-07 LAB — POCT INR: INR: 2.8

## 2012-04-12 IMAGING — CR DG CHEST 2V
2 series · 2 of 2 positions shown · non-contrast
Comparison: None.

CLINICAL DATA: Preop for left breast carcinoma

CHEST - 2 VIEW

[view not recorded (1 of 2)]
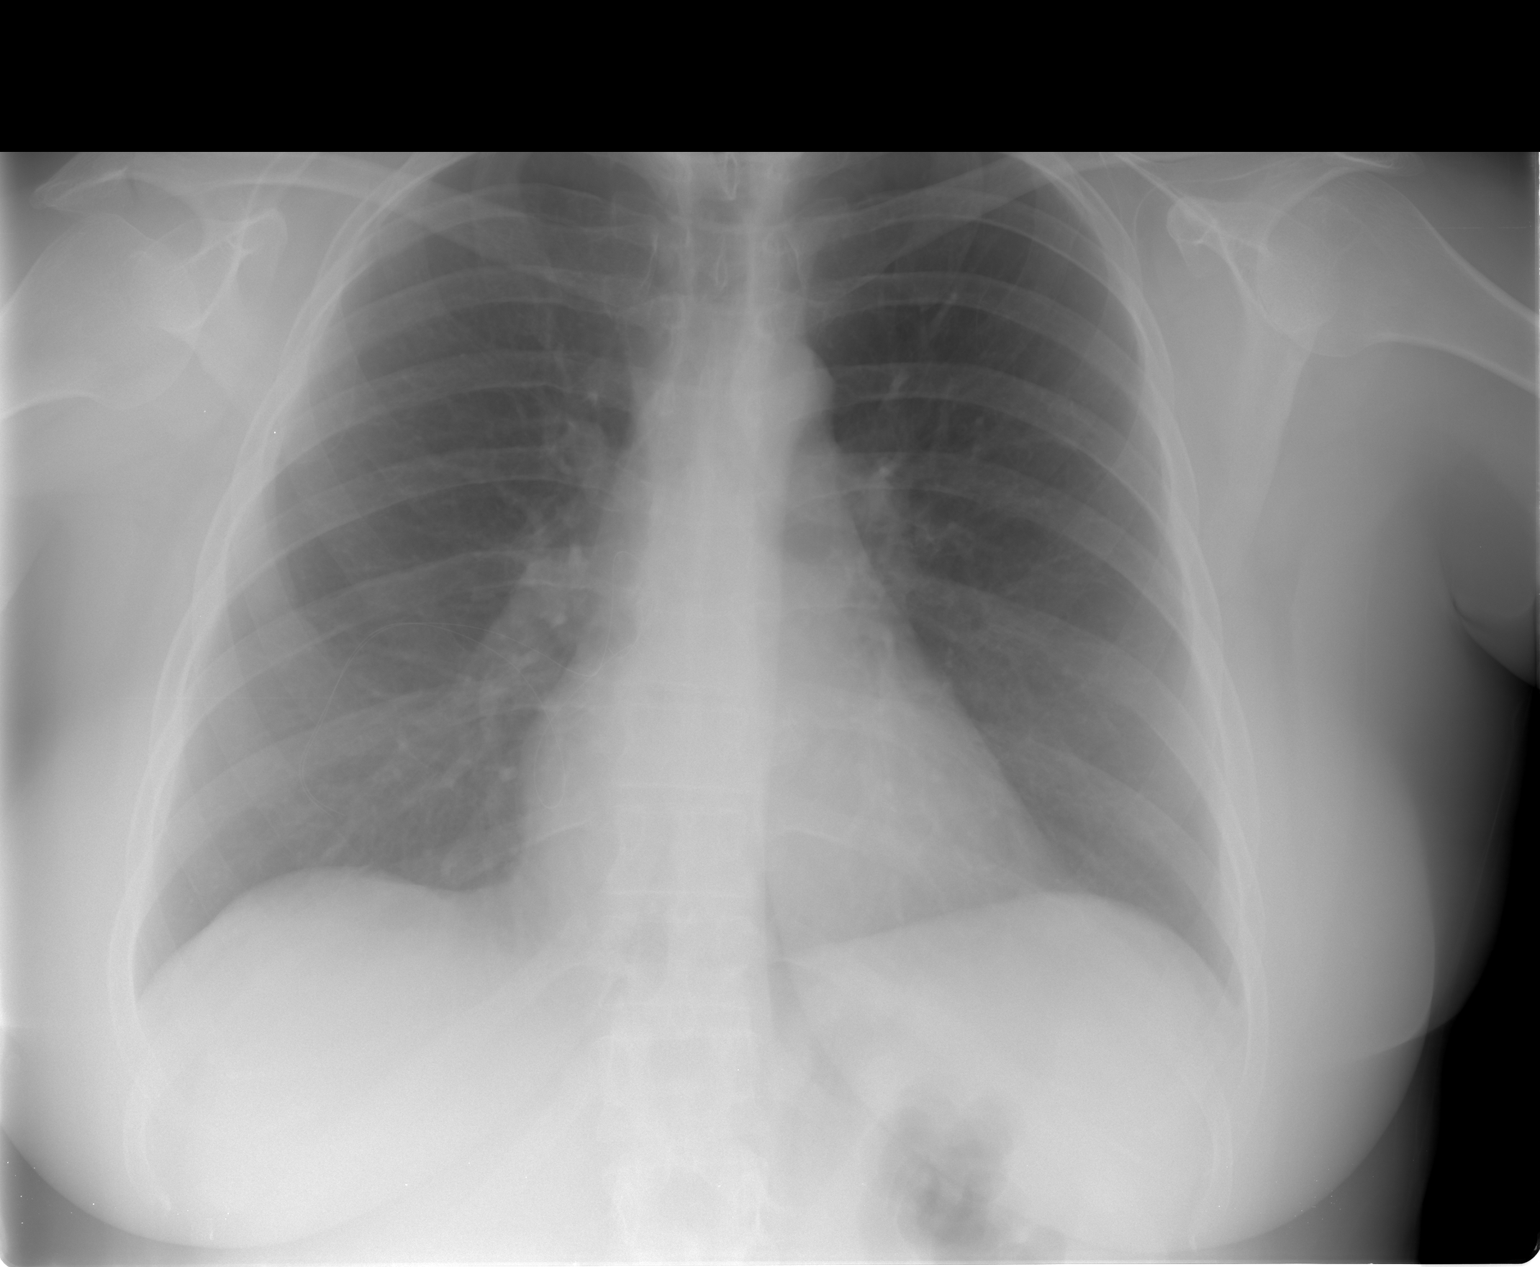

[view not recorded (2 of 2)]
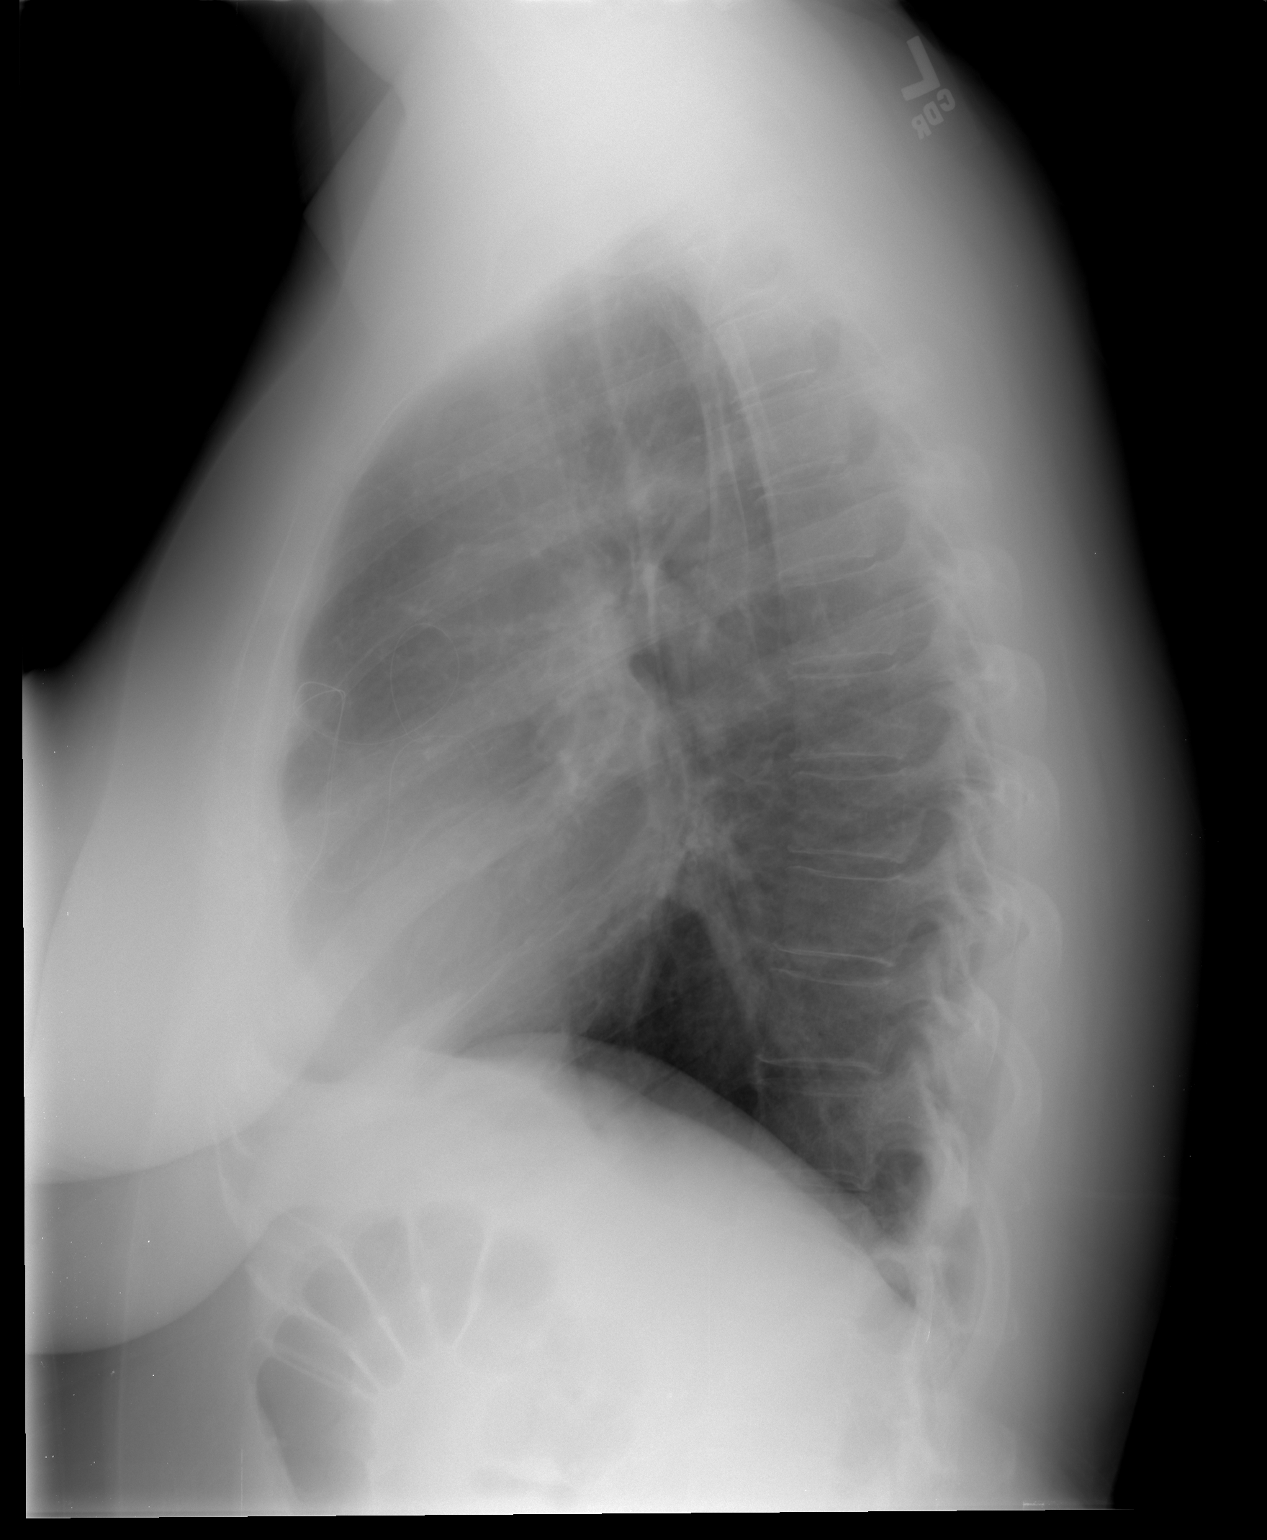

[2 of 2 positions shown; findings below may reference images not displayed]

FINDINGS: The lungs are clear.  Mediastinal contours appear normal.
Minimal peribronchial thickening is noted.  The heart is within
normal limits in size.  No bony abnormality is seen.
IMPRESSION: No active lung disease.

## 2012-04-21 ENCOUNTER — Other Ambulatory Visit: Payer: Self-pay | Admitting: Physician Assistant

## 2012-04-21 ENCOUNTER — Ambulatory Visit (INDEPENDENT_AMBULATORY_CARE_PROVIDER_SITE_OTHER): Payer: BC Managed Care – PPO | Admitting: *Deleted

## 2012-04-21 DIAGNOSIS — Z954 Presence of other heart-valve replacement: Secondary | ICD-10-CM | POA: Diagnosis not present

## 2012-04-21 DIAGNOSIS — Z7901 Long term (current) use of anticoagulants: Secondary | ICD-10-CM

## 2012-04-21 DIAGNOSIS — I059 Rheumatic mitral valve disease, unspecified: Secondary | ICD-10-CM

## 2012-04-30 DIAGNOSIS — E039 Hypothyroidism, unspecified: Secondary | ICD-10-CM | POA: Diagnosis not present

## 2012-04-30 DIAGNOSIS — Z6830 Body mass index (BMI) 30.0-30.9, adult: Secondary | ICD-10-CM | POA: Diagnosis not present

## 2012-04-30 DIAGNOSIS — G8929 Other chronic pain: Secondary | ICD-10-CM | POA: Diagnosis not present

## 2012-05-12 ENCOUNTER — Ambulatory Visit (INDEPENDENT_AMBULATORY_CARE_PROVIDER_SITE_OTHER): Payer: BC Managed Care – PPO | Admitting: *Deleted

## 2012-05-12 ENCOUNTER — Ambulatory Visit (INDEPENDENT_AMBULATORY_CARE_PROVIDER_SITE_OTHER): Payer: BC Managed Care – PPO | Admitting: Cardiology

## 2012-05-12 ENCOUNTER — Encounter: Payer: Self-pay | Admitting: Cardiology

## 2012-05-12 VITALS — BP 100/70 | HR 80 | Ht 64.0 in | Wt 179.0 lb

## 2012-05-12 DIAGNOSIS — Z7901 Long term (current) use of anticoagulants: Secondary | ICD-10-CM | POA: Diagnosis not present

## 2012-05-12 DIAGNOSIS — M329 Systemic lupus erythematosus, unspecified: Secondary | ICD-10-CM

## 2012-05-12 DIAGNOSIS — I059 Rheumatic mitral valve disease, unspecified: Secondary | ICD-10-CM

## 2012-05-12 DIAGNOSIS — Z954 Presence of other heart-valve replacement: Secondary | ICD-10-CM | POA: Diagnosis not present

## 2012-05-12 DIAGNOSIS — C50919 Malignant neoplasm of unspecified site of unspecified female breast: Secondary | ICD-10-CM

## 2012-05-12 NOTE — Assessment & Plan Note (Signed)
Status post chemotherapy and left mastectomy, in remission, followed by Dr. Mariel Sleet.

## 2012-05-12 NOTE — Assessment & Plan Note (Signed)
Keep regular followup with primary care physician.

## 2012-05-12 NOTE — Assessment & Plan Note (Signed)
10 status post St. Jude mechanical prosthesis it is in 2008 as outlined. Patient is doing well clinically, examination reveals expected heart sounds. She should continue Coumadin with close followup in the Coumadin clinic. We will arrange a followup echocardiogram for next visit in 9 months, 3 years from the previous study.

## 2012-05-12 NOTE — Patient Instructions (Addendum)
Your physician recommends that you schedule a follow-up appointment in: 9 months  Your physician has requested that you have an echocardiogram. Echocardiography is a painless test that uses sound waves to create images of your heart. It provides your doctor with information about the size and shape of your heart and how well your heart's chambers and valves are working. This procedure takes approximately one hour. There are no restrictions for this procedure PRIOR TO YOUR 9 MONTH OV

## 2012-05-12 NOTE — Progress Notes (Signed)
Clinical Summary Ms. Passe is a 42 y.o.female presenting to the office for a followup visit. She is a former patient of Dr. Andee Lineman, last seen in April 2013. 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 She had mechanical MVR at [facility] in 2008.  Echocardiogram in December 2011 demonstrated mild LVH with LVEF 60%, stable function of St. Jude mechanical mitral prosthesis, moderately dilated left atrium, PA systolic pressure 40 mm mercury.  [facility] today shows sinus rhythm with limb lead reversal.  She is here with her daughter today. She reports no significant problems with reproducible exertional chest pain or shortness of breath. No palpitations. No major bleeding episodes. She is followed in the Coumadin clinic.  No Known Allergies  Current Outpatient Prescriptions  Medication Sig Dispense Refill  . cyanocobalamin (,VITAMIN B-12,) 1000 MCG/ML injection Inject 1 mL (1,000 mcg total) into the muscle every 30 (thirty) days.  10 mL  prn  . cyclobenzaprine (FLEXERIL) 10 MG tablet 10 mg as needed.       . folic acid (FOLVITE) 1 MG tablet Take 1 mg by mouth daily.        . furosemide (LASIX) 40 MG tablet Take 20 mg by mouth daily.       Marland Kitchen gabapentin (NEURONTIN) 600 MG tablet Take 600 mg by mouth 2 (two) times daily.       Marland Kitchen HYDROcodone-acetaminophen (VICODIN) 5-500 MG per tablet Take 1 tablet by mouth every 6 (six) hours as needed.      . hydroxychloroquine (PLAQUENIL) 200 MG tablet Take 200 mg by mouth 2 (two) times daily.        Marland Kitchen KLOR-CON M20 20 MEQ tablet TAKE ONE TABLET BY MOUTH EVERY DAY  30 each  5  . levothyroxine (SYNTHROID, LEVOTHROID) 88 MCG tablet Take 1 tablet by mouth Daily.      . methotrexate (RHEUMATREX) 2.5 MG tablet Take 8 tablets by mouth Once a week. Taking on Mondays      . rOPINIRole (REQUIP) 0.5 MG tablet Take 1 mg by mouth at bedtime.       Marland Kitchen warfarin (COUMADIN) 5 MG tablet TAKE 2 TABLETS BY MOUTH  DAILY EXCEPT 1 TABLET ON MONDAYS, WEDNESDAYS, AND FRIDAYS  45 tablet  3  . zolpidem (AMBIEN) 10 MG tablet Take 10 mg by mouth at bedtime as needed.       No current facility-administered medications for this visit.    Past Medical History  Diagnosis Date  . SLE (systemic lupus erythematosus)   . BRCA2 negative 08/16/2010  . BRCA1 negative 08/16/2010  . 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 B12 deficiency 08/21/2010  . Mitral valve replaced     St. Jude bileaflet  . Raynaud disease   . Fibromyalgia   . Anxiety   . Peripheral neuropathy   . PONV (postoperative nausea and vomiting)   . Anticoagulation goal of INR 2.5 to 3.5   . Breast cancer     Left side    Social History Ms. Bellin reports that she quit smoking about 6 years ago. Her smoking use included Cigarettes. She has a 15 pack-year smoking history. She has never used smokeless tobacco. Ms. Zobrist reports that  drinks alcohol.  Review of Systems No regular exercise regimen other than walking occasionally. Stable appetite. Otherwise negative except as outlined.  Physical Examination Filed Vitals:   05/12/12 1409  BP: 100/70  Pulse: 80   Filed  Weights   05/12/12 1409  Weight: 179 lb (81.194 kg)   No acute distress. HEENT: Conjunctiva and lids normal, oropharynx clear. Neck: Supple, no elevated JVP or carotid bruits, no thyromegaly. Lungs: Clear to auscultation, nonlabored breathing at rest. Cardiac: Regular rate and rhythm, crisp prosthetic sound in S1, no S3 or significant systolic murmur, no pericardial rub. Abdomen: Soft, nontender, bowel sounds present, no guarding or rebound. Extremities: No pitting edema, distal pulses 2+. Skin: Warm and dry. Musculoskeletal: No kyphosis. Neuropsychiatric: Alert and oriented x3, affect grossly appropriate.   Problem List and Plan   Mitral valve replaced 10 status post St. Jude mechanical prosthesis it is in 2008 as outlined. Patient is  doing well clinically, examination reveals expected heart sounds. She should continue Coumadin with close followup in the Coumadin clinic. We will arrange a followup echocardiogram for next visit in 9 months, 3 years from the previous study.  BREAST CANCER Status post chemotherapy and left mastectomy, in remission, followed by Dr. Mariel Sleet.  Systemic lupus erythematosus Keep regular followup with primary care physician.    Jonelle Sidle, M.D., F.A.C.C.

## 2012-05-20 ENCOUNTER — Ambulatory Visit: Payer: BC Managed Care – PPO | Attending: Rheumatology

## 2012-05-20 DIAGNOSIS — M2550 Pain in unspecified joint: Secondary | ICD-10-CM | POA: Insufficient documentation

## 2012-05-20 DIAGNOSIS — R5381 Other malaise: Secondary | ICD-10-CM | POA: Insufficient documentation

## 2012-05-20 DIAGNOSIS — M2560 Stiffness of unspecified joint, not elsewhere classified: Secondary | ICD-10-CM | POA: Insufficient documentation

## 2012-05-20 DIAGNOSIS — IMO0001 Reserved for inherently not codable concepts without codable children: Secondary | ICD-10-CM | POA: Insufficient documentation

## 2012-05-20 DIAGNOSIS — R293 Abnormal posture: Secondary | ICD-10-CM | POA: Insufficient documentation

## 2012-05-26 ENCOUNTER — Ambulatory Visit (INDEPENDENT_AMBULATORY_CARE_PROVIDER_SITE_OTHER): Payer: BC Managed Care – PPO | Admitting: *Deleted

## 2012-05-26 ENCOUNTER — Ambulatory Visit: Payer: BC Managed Care – PPO | Admitting: Rehabilitation

## 2012-05-26 DIAGNOSIS — Z954 Presence of other heart-valve replacement: Secondary | ICD-10-CM | POA: Diagnosis not present

## 2012-05-26 DIAGNOSIS — Z7901 Long term (current) use of anticoagulants: Secondary | ICD-10-CM

## 2012-05-26 DIAGNOSIS — I059 Rheumatic mitral valve disease, unspecified: Secondary | ICD-10-CM | POA: Diagnosis not present

## 2012-05-26 LAB — POCT INR: INR: 2

## 2012-05-26 IMAGING — CR DG ABDOMEN ACUTE W/ 1V CHEST
3 series · 3 of 3 positions shown · non-contrast
Comparison: Chest x-ray 01/11/2011

CLINICAL DATA: Nausea, vomiting, sepsis.

ACUTE ABDOMEN SERIES (ABDOMEN 2 VIEW & CHEST 1 VIEW)

[view not recorded (1 of 3)]
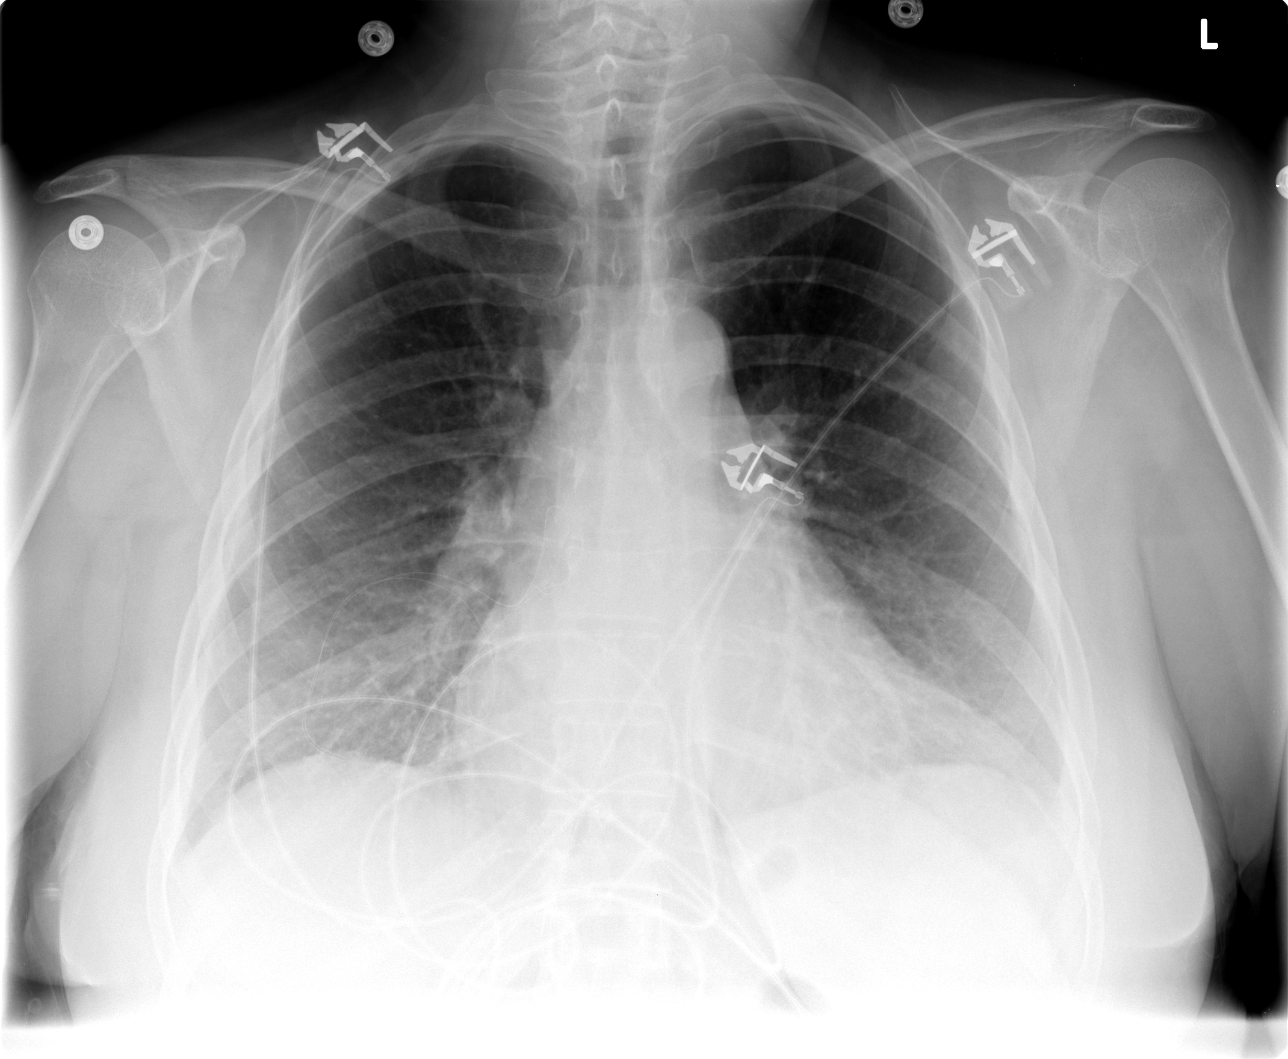

[view not recorded (2 of 3)]
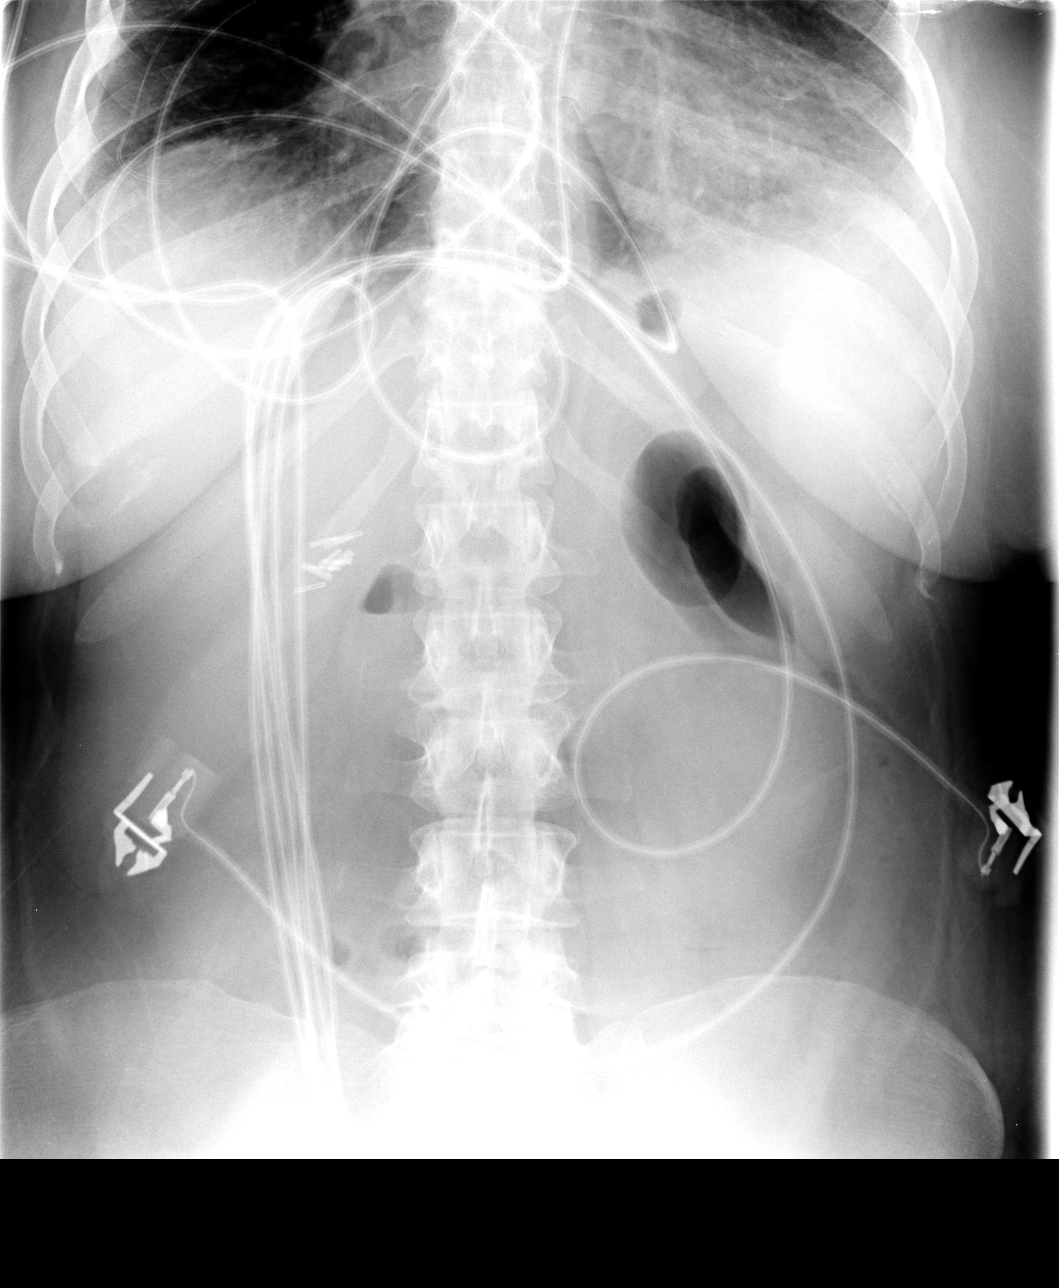

[view not recorded (3 of 3)]
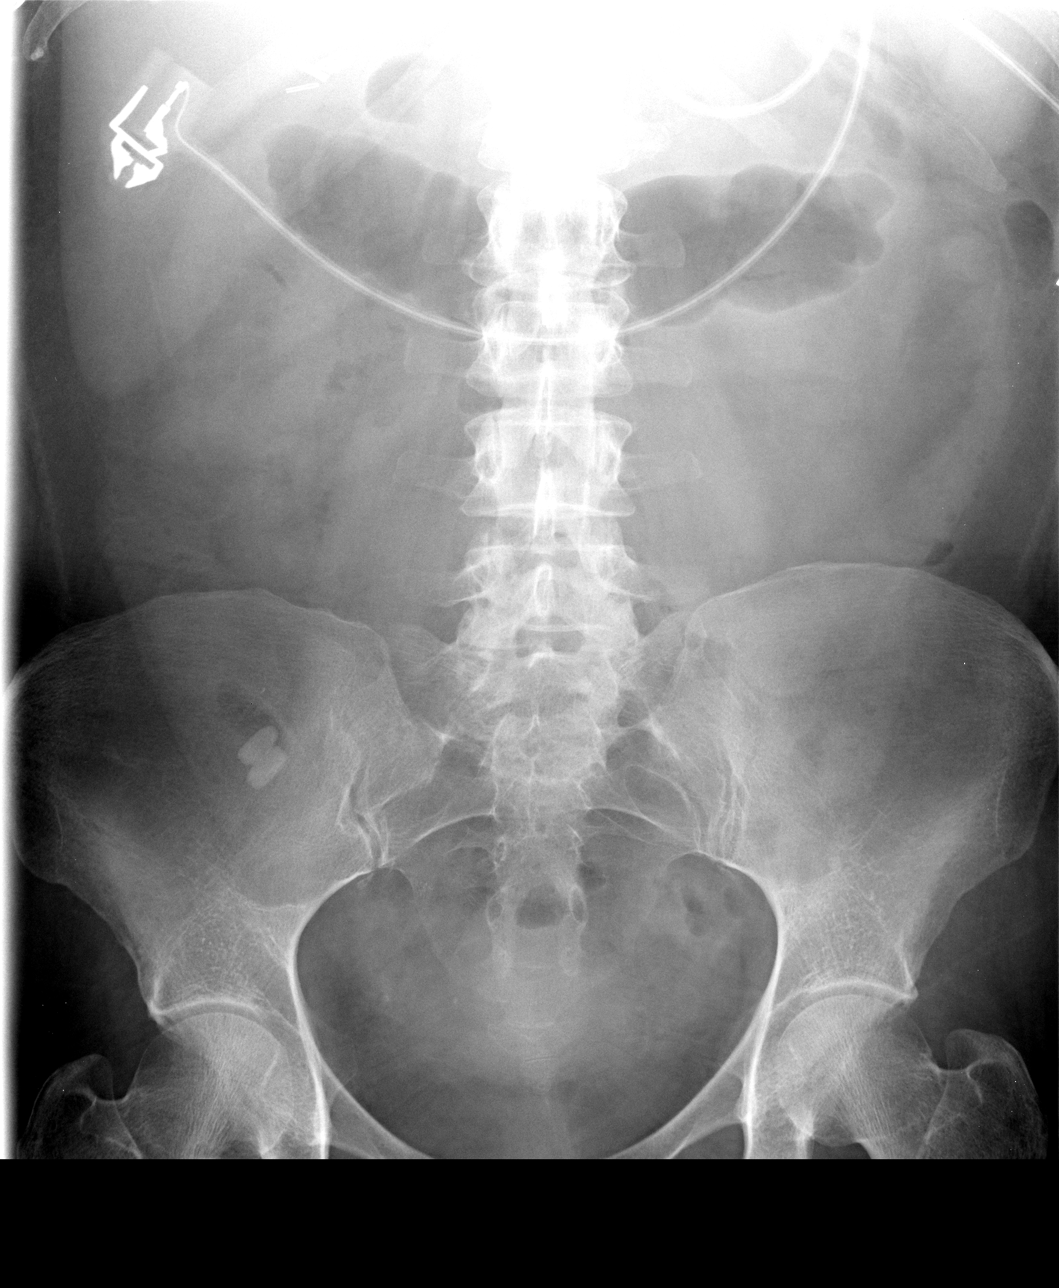

[3 of 3 positions shown; findings below may reference images not displayed]

FINDINGS: There is mild cardiomegaly.  Lungs are clear.  No
effusions.

There is a nonspecific bowel gas pattern.  Gas noted within
nondilated transverse colon.  Otherwise there is a relative paucity
of gas throughout the abdomen.  Ovoid radiopaque densities are seen
in the right lower quadrant, presumably ingested pills.  Prior
cholecystectomy.  No free air.
IMPRESSION: Nonspecific bowel gas pattern.  There is a relative paucity gas in
the abdomen and pelvis.

Prior cholecystectomy.

Mild cardiomegaly.

## 2012-05-26 IMAGING — US US EXTREM LOW VENOUS*L*
1 series · 14 of 24 positions shown · non-contrast
Comparison: None.

CLINICAL DATA: 39-year-old female with sepsis and calf pain.

LEFT LOWER EXTREMITY VENOUS DUPLEX ULTRASOUND
TECHNIQUE: Gray-scale sonography with graded compression, as well
as color Doppler and duplex ultrasound, were performed to evaluate
the deep venous system of the lower extremity from the level of the
common femoral vein through the popliteal and proximal calf veins.
Spectral Doppler was utilized to evaluate flow at rest and with
distal augmentation maneuvers.

[Series 1: us extrem low venous*left* · 14 of 25 slices shown]
[im 1/25]
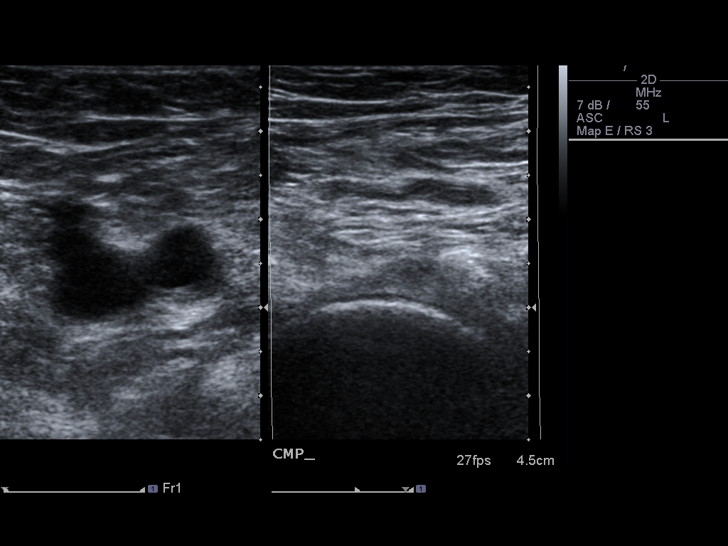
[im 3/25]
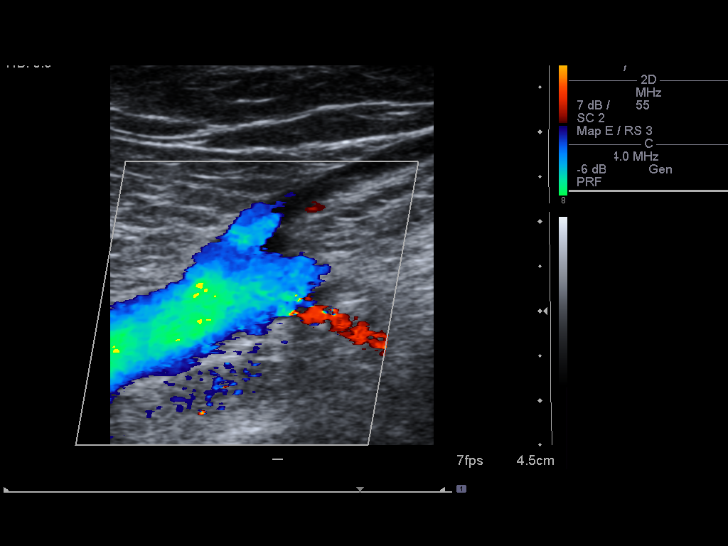
[im 5/25]
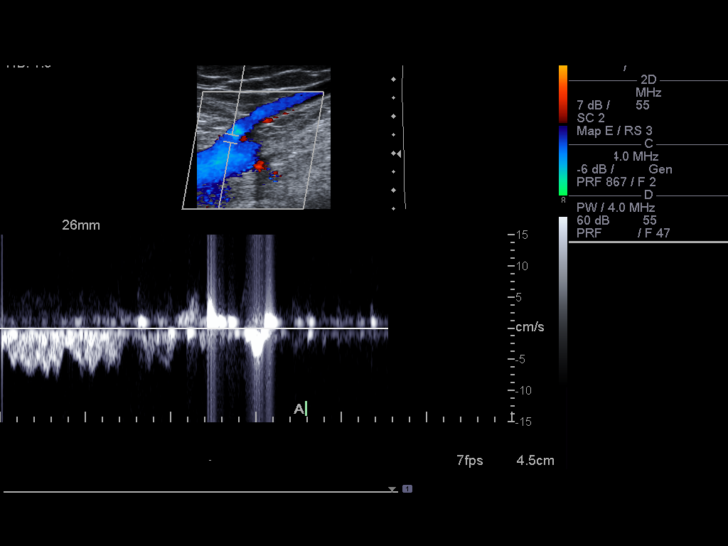
[im 7/25]
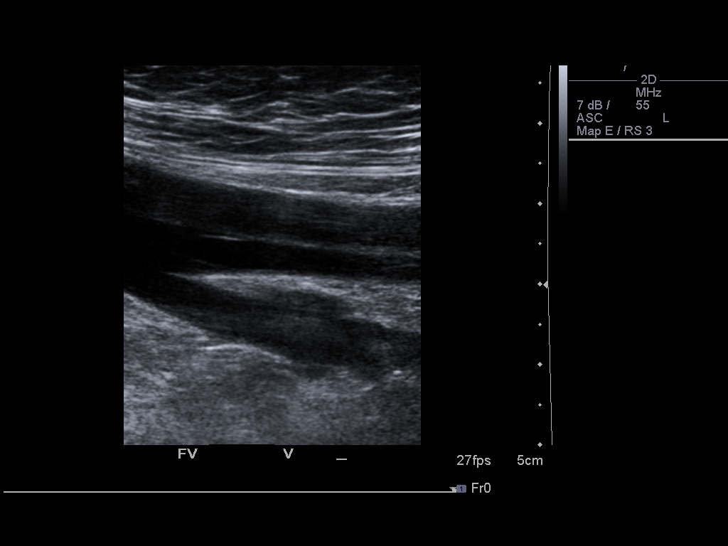
[im 8/25]
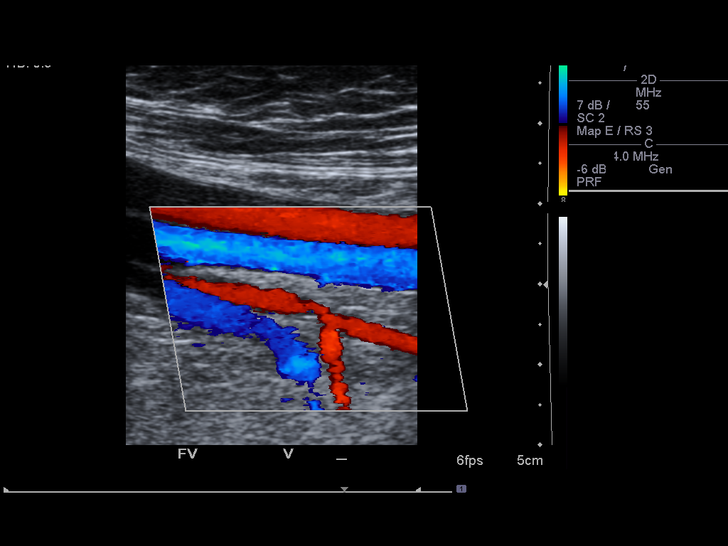
[im 10/25]
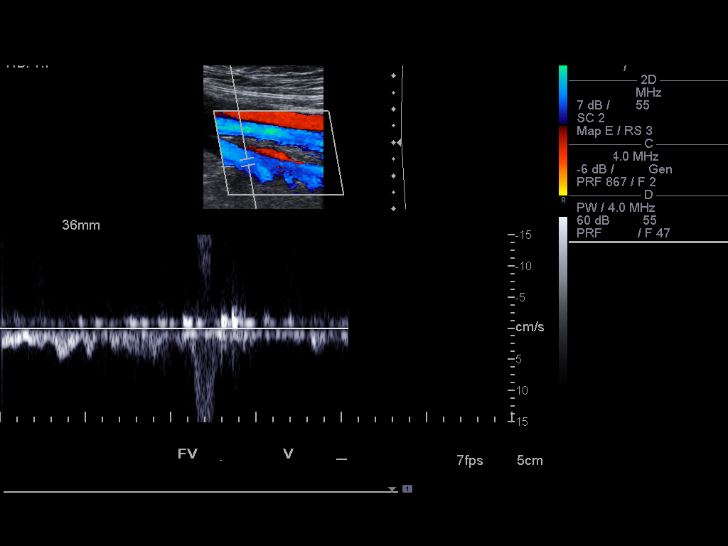
[im 12/25]
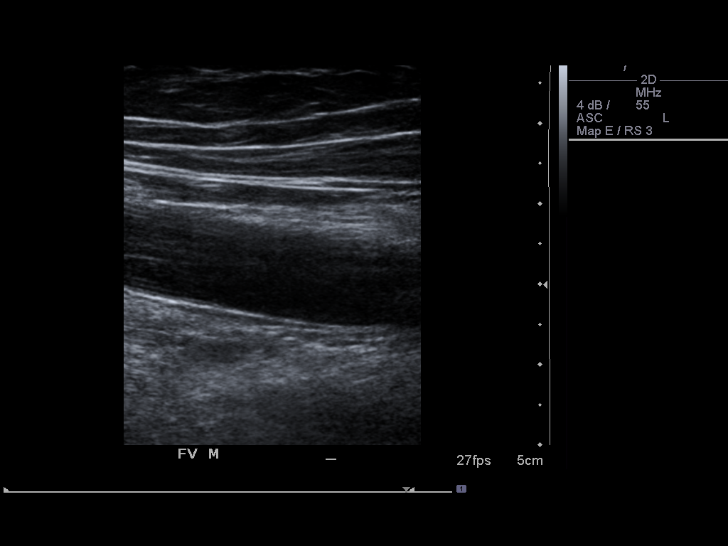
[im 13/25]
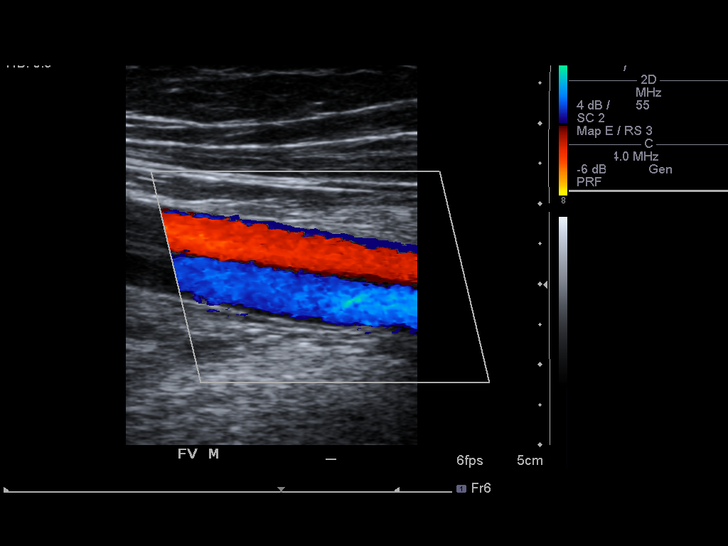
[im 15/25]
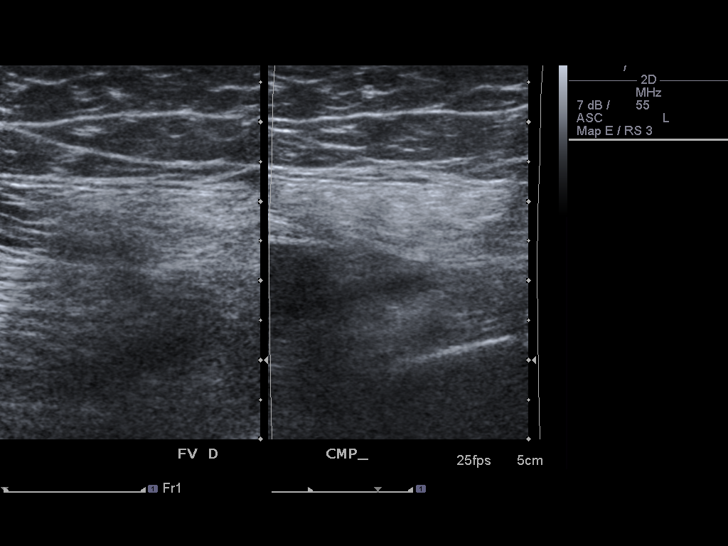
[im 17/25]
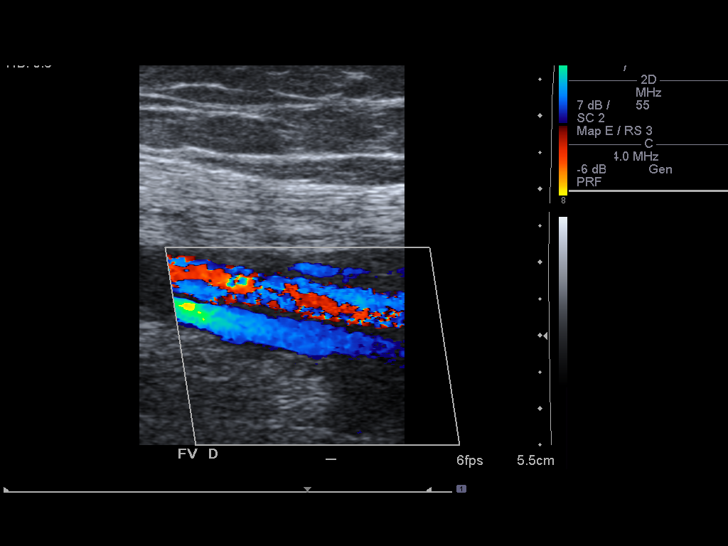
[im 19/25]
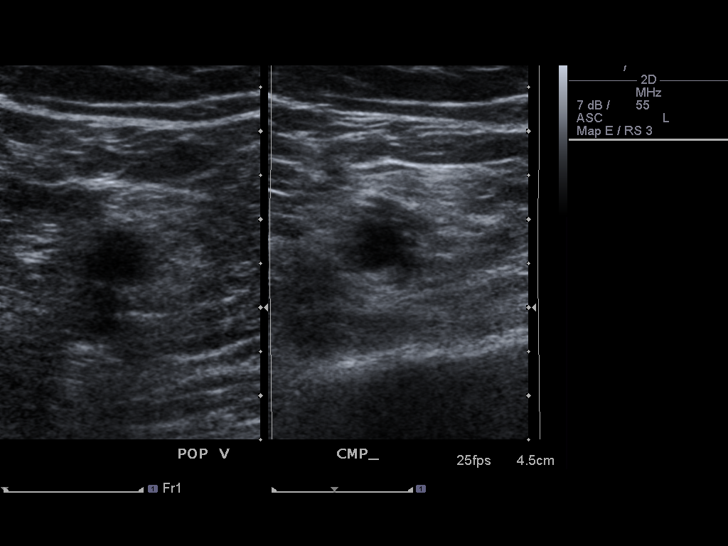
[im 20/25]
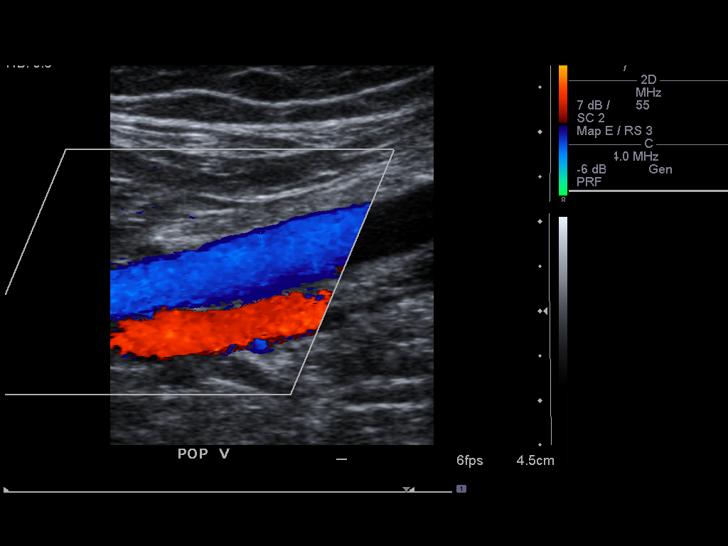
[im 22/25]
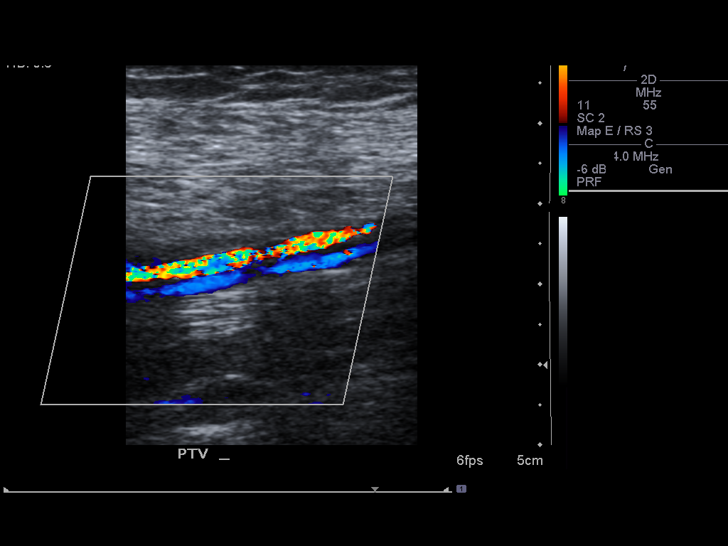
[im 25/25]
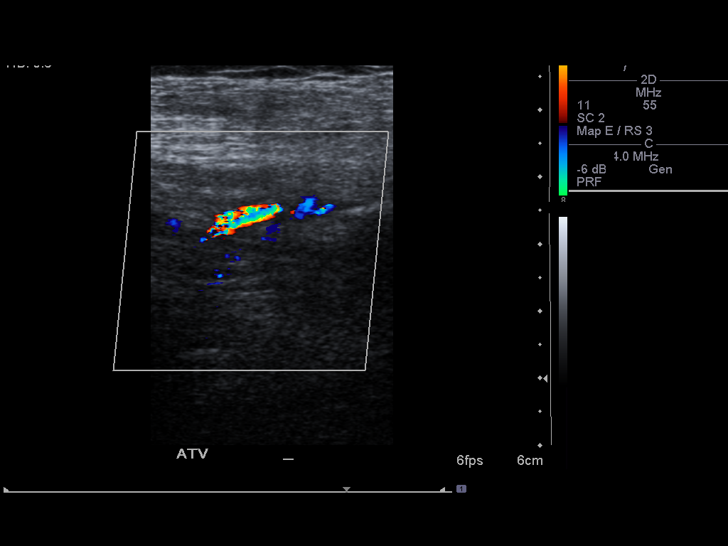

[14 of 24 positions shown; findings below may reference images not displayed]

FINDINGS: Normal compressibility of the left common femoral,
superficial femoral, and popliteal veins is demonstrated, as well
as the visualized proximal calf veins.  No filling defects to
suggest DVT on grayscale or color Doppler imaging.  Doppler
waveforms show normal direction of venous flow, normal respiratory
phasicity and response to augmentation.
IMPRESSION: No evidence of left lower extremity deep venous thrombosis.

## 2012-05-26 IMAGING — CR DG CHEST 1V PORT
1 series · 1 of 1 positions shown · non-contrast
Comparison: 02/20/2010

CLINICAL DATA: Sepsis.  Neutropenic infection.  Recent
cholecystectomy.  History of breast cancer.

PORTABLE CHEST - 1 VIEW

[view not recorded]
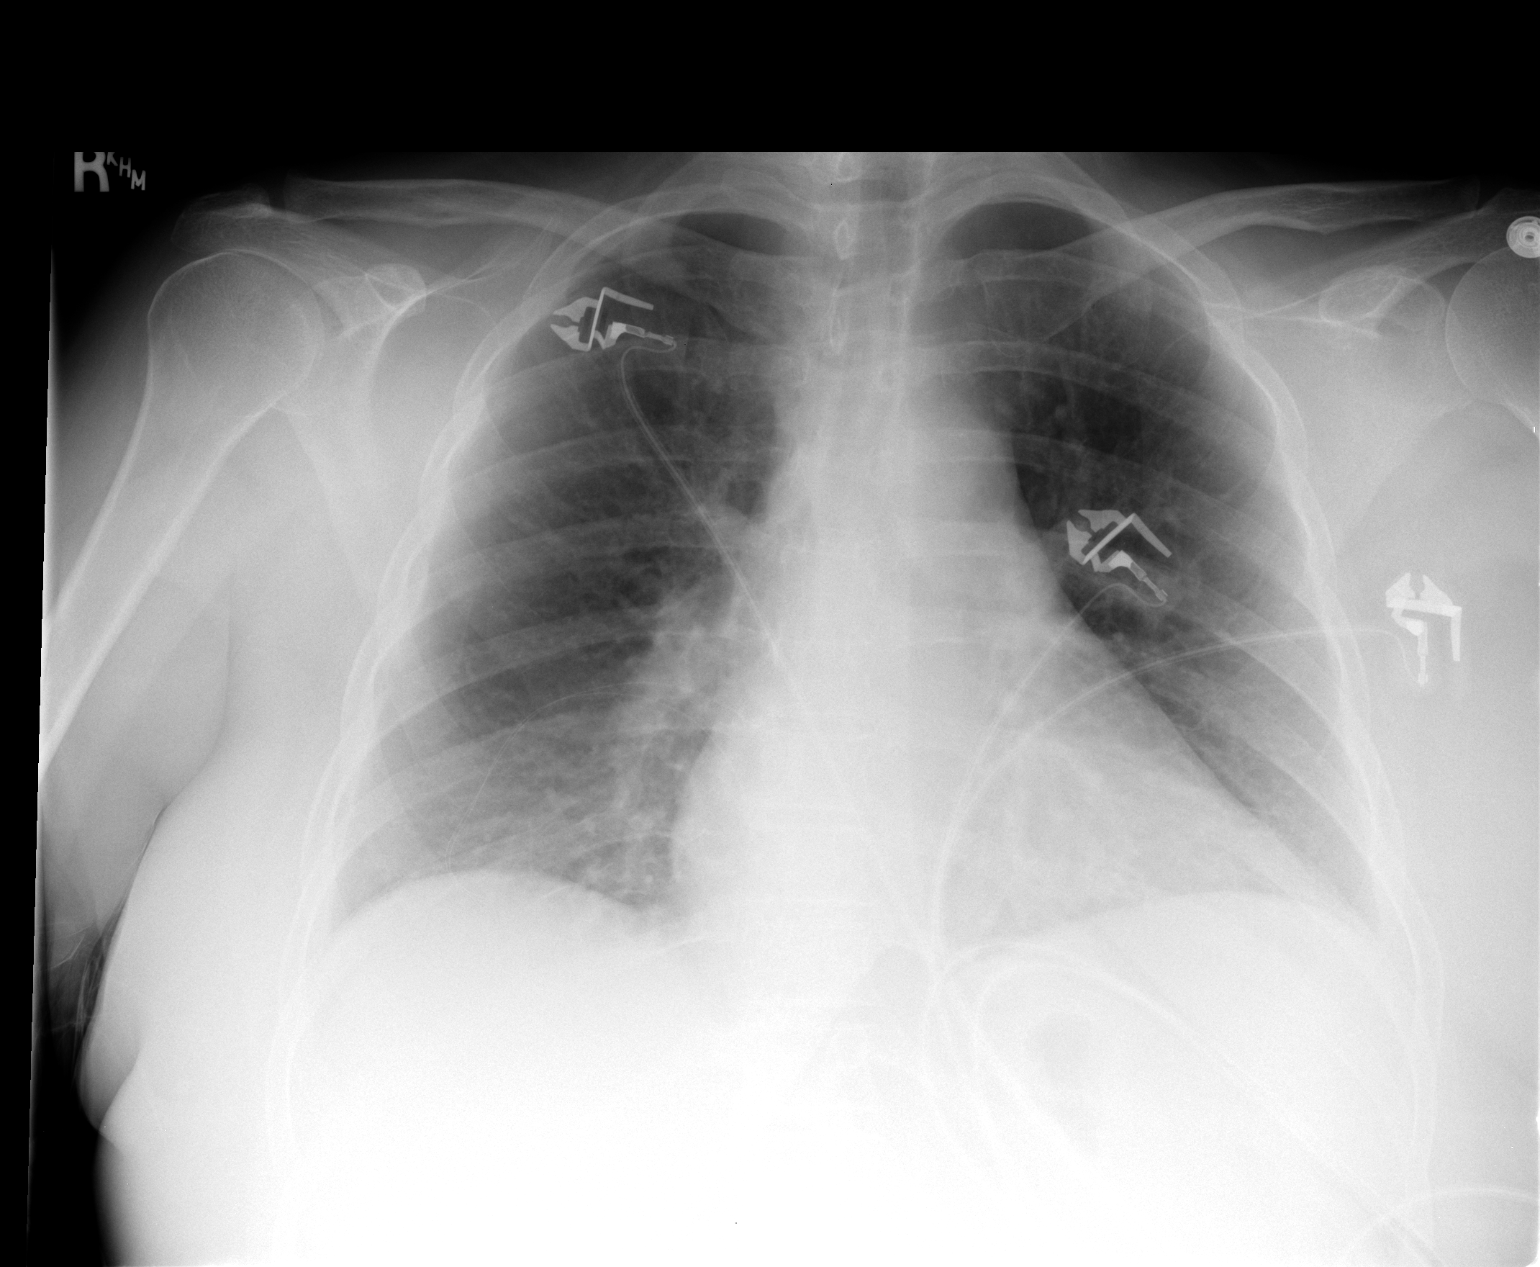

[1 of 1 positions shown; findings below may reference images not displayed]

FINDINGS: Heart is mildly enlarged but also accentuated by
technique.  Right Port-A-Cath has been removed.  No focal
consolidations or pleural effusions.  No evidence for pulmonary
edema.  Epicardial pacing lead overlies the right lower lobe.
IMPRESSION: Cardiomegaly.

## 2012-05-30 ENCOUNTER — Ambulatory Visit: Payer: BC Managed Care – PPO

## 2012-06-03 ENCOUNTER — Ambulatory Visit: Payer: BC Managed Care – PPO

## 2012-06-09 ENCOUNTER — Ambulatory Visit: Payer: BC Managed Care – PPO | Admitting: Rehabilitation

## 2012-06-11 ENCOUNTER — Ambulatory Visit: Payer: BC Managed Care – PPO | Admitting: Rehabilitation

## 2012-06-11 ENCOUNTER — Ambulatory Visit (INDEPENDENT_AMBULATORY_CARE_PROVIDER_SITE_OTHER): Payer: BC Managed Care – PPO | Admitting: *Deleted

## 2012-06-11 ENCOUNTER — Other Ambulatory Visit (HOSPITAL_COMMUNITY): Payer: Self-pay | Admitting: Oncology

## 2012-06-11 DIAGNOSIS — Z7901 Long term (current) use of anticoagulants: Secondary | ICD-10-CM

## 2012-06-11 DIAGNOSIS — I059 Rheumatic mitral valve disease, unspecified: Secondary | ICD-10-CM | POA: Diagnosis not present

## 2012-06-11 DIAGNOSIS — Z954 Presence of other heart-valve replacement: Secondary | ICD-10-CM | POA: Diagnosis not present

## 2012-06-11 LAB — POCT INR: INR: 3.2

## 2012-06-16 ENCOUNTER — Ambulatory Visit: Payer: BC Managed Care – PPO

## 2012-06-18 ENCOUNTER — Ambulatory Visit: Payer: BC Managed Care – PPO

## 2012-06-24 ENCOUNTER — Encounter (HOSPITAL_COMMUNITY): Payer: Self-pay | Admitting: Oncology

## 2012-06-24 ENCOUNTER — Encounter (HOSPITAL_COMMUNITY): Payer: BC Managed Care – PPO | Attending: Oncology | Admitting: Oncology

## 2012-06-24 VITALS — BP 133/80 | HR 76 | Temp 97.9°F | Resp 18 | Wt 181.4 lb

## 2012-06-24 DIAGNOSIS — E538 Deficiency of other specified B group vitamins: Secondary | ICD-10-CM | POA: Insufficient documentation

## 2012-06-24 DIAGNOSIS — C50219 Malignant neoplasm of upper-inner quadrant of unspecified female breast: Secondary | ICD-10-CM

## 2012-06-24 DIAGNOSIS — M329 Systemic lupus erythematosus, unspecified: Secondary | ICD-10-CM

## 2012-06-24 DIAGNOSIS — Z09 Encounter for follow-up examination after completed treatment for conditions other than malignant neoplasm: Secondary | ICD-10-CM | POA: Insufficient documentation

## 2012-06-24 DIAGNOSIS — Z171 Estrogen receptor negative status [ER-]: Secondary | ICD-10-CM | POA: Diagnosis not present

## 2012-06-24 DIAGNOSIS — C50919 Malignant neoplasm of unspecified site of unspecified female breast: Secondary | ICD-10-CM

## 2012-06-24 DIAGNOSIS — D591 Autoimmune hemolytic anemia, unspecified: Secondary | ICD-10-CM | POA: Insufficient documentation

## 2012-06-24 DIAGNOSIS — Z853 Personal history of malignant neoplasm of breast: Secondary | ICD-10-CM | POA: Insufficient documentation

## 2012-06-24 LAB — COMPREHENSIVE METABOLIC PANEL
ALT: 14 U/L (ref 0–35)
Alkaline Phosphatase: 72 U/L (ref 39–117)
CO2: 27 mEq/L (ref 19–32)
Chloride: 104 mEq/L (ref 96–112)
GFR calc Af Amer: >90 mL/min (ref >90)
GFR calc non Af Amer: >90 mL/min (ref >90)
Glucose, Bld: 76 mg/dL (ref 70–99)
Potassium: 3.7 mEq/L (ref 3.5–5.1)
Sodium: 138 mEq/L (ref 135–145)

## 2012-06-24 LAB — CBC WITH DIFFERENTIAL/PLATELET
Lymphocytes Relative: 20 % (ref 12–46)
Lymphs Abs: 1 10*3/uL (ref 0.7–4.0)
Neutrophils Relative %: 67 % (ref 43–77)
Platelets: 152 10*3/uL (ref 150–400)
RBC: 3.78 MIL/uL — ABNORMAL LOW (ref 3.87–5.11)
WBC: 5 10*3/uL (ref 4.0–10.5)

## 2012-06-24 MED ORDER — CYANOCOBALAMIN 1000 MCG/ML IJ SOLN
INTRAMUSCULAR | Status: AC
  Filled 2012-06-24: qty 1

## 2012-06-24 MED ORDER — CYANOCOBALAMIN 1000 MCG/ML IJ SOLN
1000.0000 ug | Freq: Once | INTRAMUSCULAR | Status: AC
  Administered 2012-06-24: 1000 ug via INTRAMUSCULAR

## 2012-06-24 NOTE — Patient Instructions (Addendum)
[facility] Discharge Instructions  RECOMMENDATIONS MADE BY THE CONSULTANT AND ANY TEST RESULTS WILL BE SENT TO YOUR REFERRING PHYSICIAN.  Lab work today. We will call you with any abnormal results. Return to clinic in 6 months to see MD. Report any issues/concerns to clinic as needed.  Thank you for choosing [facility] to provide your oncology and hematology care.  To afford each patient quality time with our providers, please arrive at least 15 minutes before your scheduled appointment time.  With your help, our goal is to use those 15 minutes to complete the necessary work-up to ensure our physicians have the information they need to help with your evaluation and healthcare recommendations.    Effective January 1st, 2014, we ask that you re-schedule your appointment with our physicians should you arrive 10 or more minutes late for your appointment.  We strive to give you quality time with our providers, and arriving late affects you and other patients whose appointments are after yours.    Again, thank you for choosing [facility].  Our hope is that these requests will decrease the amount of time that you wait before being seen by our physicians.       _____________________________________________________________  Should you have questions after your visit to [facility], please contact our office at [phone] between the hours of 8:30 a.m. and 5:00 p.m.  Voicemails left after 4:30 p.m. will not be returned until the following business day.  For prescription refill requests, have your pharmacy contact our office with your prescription refill request.

## 2012-06-24 NOTE — Progress Notes (Signed)
[name] presents today for injection per MD orders. B12 1000 mcg administered IM in right Upper Arm. Administration without incident. Patient tolerated well.

## 2012-06-24 NOTE — Progress Notes (Signed)
#  1 left-sided breast cancer, triple negative, 3.8 cm in size, left upper quadrant mass with no obvious adenopathy. She took 2 cycles of FEC followed by mastectomy on 04/20/2010. This showed no disease in the lymph nodes but still she had a 1.6 cm cancer, grade 3. Margins were clear. Your receptors negative, PR receptors negative, HER-2/neu negative. Ki-67 marker high at 92%. LV I was not identified. She was then treated with Taxotere and carboplatin for 4 cycles with difficulty tolerating the chemotherapy. She did receive a total 6 cycles 2 before surgery and 4 after.  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 difficile diarrhea in the past #6 cholecystectomy in the past  #7 mitral valve replacement has a few bowel for 2 years ago with a history of CHF still on lifelong anticoagulation. She has a history of rheumatic heart disease as a child #8 BRCA1 and BRCA2 negative #9 hypothyroidism #10 B12 deficiency on monthly B12 shots. We will give her the B12 shot today since she cannot obtain it from her drugstore do a back order nationally  She is not aware of any lumps or bumps anywhere but she feels very weak in her legs. She describes them as jelly legs. She's had this before when she was anemic.  She does not have B. symptomatology. She is not aware bone pain. She does have left shoulder pain and is about to have a MRI potentially. She has been taking physical therapy for a month without any improvement but she still is very good range of motion.  His also having mild back discomfort intermittently.  On exam her weight is 181 pounds. This is down 3 pounds from November. She is in no acute distress. Facial symmetry is intact. Conjunctival color is intact. Nail color slightly diminished. She has no cyanosis. Lungs are clear to auscultation and percussion. The right breast  is negative for masses but surgical scars are intact. The left reconstructed breast is negative for any skin or subcutaneous lesions. Abdomen is soft and nontender without organomegaly. Bowel sounds are normal. Her heart shows a clear-cut clicks of the heart valve. She is no distinct S3 gallop. I did not presently hear a murmur.  She has no leg edema but she does have discoloration of her toes portions of the feet consistent with her Raynaud's phenomenon.  She has no arm edema.  We will see with some baseline blood work shows tentatively see her back in 6 months. Her back pain gets worse hopefully she will let us know. She will see Dr. Nickola Major this coming Thursday

## 2012-06-25 LAB — FOLATE: Folate: >20 ng/mL

## 2012-06-25 LAB — IRON AND TIBC
Iron: 51 ug/dL (ref 42–135)
Saturation Ratios: 17 % — ABNORMAL LOW (ref 20–55)
UIBC: 241 ug/dL (ref 125–400)

## 2012-06-25 LAB — FERRITIN: Ferritin: 110 ng/mL (ref 10–291)

## 2012-06-30 ENCOUNTER — Ambulatory Visit: Payer: BC Managed Care – PPO

## 2012-06-30 ENCOUNTER — Other Ambulatory Visit (HOSPITAL_COMMUNITY): Payer: Self-pay

## 2012-06-30 DIAGNOSIS — C50919 Malignant neoplasm of unspecified site of unspecified female breast: Secondary | ICD-10-CM

## 2012-06-30 DIAGNOSIS — M329 Systemic lupus erythematosus, unspecified: Secondary | ICD-10-CM

## 2012-07-01 ENCOUNTER — Other Ambulatory Visit: Payer: Self-pay | Admitting: Rheumatology

## 2012-07-01 DIAGNOSIS — M545 Low back pain, unspecified: Secondary | ICD-10-CM

## 2012-07-02 ENCOUNTER — Ambulatory Visit (INDEPENDENT_AMBULATORY_CARE_PROVIDER_SITE_OTHER): Payer: BC Managed Care – PPO | Admitting: *Deleted

## 2012-07-02 DIAGNOSIS — I059 Rheumatic mitral valve disease, unspecified: Secondary | ICD-10-CM | POA: Diagnosis not present

## 2012-07-02 DIAGNOSIS — Z7901 Long term (current) use of anticoagulants: Secondary | ICD-10-CM

## 2012-07-02 DIAGNOSIS — Z954 Presence of other heart-valve replacement: Secondary | ICD-10-CM | POA: Diagnosis not present

## 2012-07-02 LAB — POCT INR: INR: 2.6

## 2012-07-03 ENCOUNTER — Ambulatory Visit
Admission: RE | Admit: 2012-07-03 | Discharge: 2012-07-03 | Disposition: A | Discharge status: Home / Self Care | Payer: BC Managed Care – PPO | Source: Ambulatory Visit | Attending: Rheumatology | Admitting: Rheumatology

## 2012-07-03 ENCOUNTER — Ambulatory Visit: Payer: BC Managed Care – PPO | Attending: Rheumatology

## 2012-07-03 DIAGNOSIS — M2550 Pain in unspecified joint: Secondary | ICD-10-CM | POA: Insufficient documentation

## 2012-07-03 DIAGNOSIS — R5381 Other malaise: Secondary | ICD-10-CM | POA: Insufficient documentation

## 2012-07-03 DIAGNOSIS — M2560 Stiffness of unspecified joint, not elsewhere classified: Secondary | ICD-10-CM | POA: Insufficient documentation

## 2012-07-03 DIAGNOSIS — M545 Low back pain, unspecified: Secondary | ICD-10-CM

## 2012-07-03 DIAGNOSIS — IMO0001 Reserved for inherently not codable concepts without codable children: Secondary | ICD-10-CM | POA: Insufficient documentation

## 2012-07-03 DIAGNOSIS — R293 Abnormal posture: Secondary | ICD-10-CM | POA: Insufficient documentation

## 2012-07-08 ENCOUNTER — Ambulatory Visit: Payer: BC Managed Care – PPO | Admitting: Rehabilitation

## 2012-07-25 DIAGNOSIS — E039 Hypothyroidism, unspecified: Secondary | ICD-10-CM | POA: Diagnosis not present

## 2012-07-25 DIAGNOSIS — Z6830 Body mass index (BMI) 30.0-30.9, adult: Secondary | ICD-10-CM | POA: Diagnosis not present

## 2012-07-25 DIAGNOSIS — G8929 Other chronic pain: Secondary | ICD-10-CM | POA: Diagnosis not present

## 2012-07-25 DIAGNOSIS — F411 Generalized anxiety disorder: Secondary | ICD-10-CM | POA: Diagnosis not present

## 2012-07-28 ENCOUNTER — Ambulatory Visit (INDEPENDENT_AMBULATORY_CARE_PROVIDER_SITE_OTHER): Payer: BC Managed Care – PPO | Admitting: *Deleted

## 2012-07-28 DIAGNOSIS — I059 Rheumatic mitral valve disease, unspecified: Secondary | ICD-10-CM | POA: Diagnosis not present

## 2012-07-28 DIAGNOSIS — Z7901 Long term (current) use of anticoagulants: Secondary | ICD-10-CM

## 2012-07-28 DIAGNOSIS — Z954 Presence of other heart-valve replacement: Secondary | ICD-10-CM

## 2012-07-28 LAB — POCT INR: INR: 3.6

## 2012-08-07 DIAGNOSIS — L259 Unspecified contact dermatitis, unspecified cause: Secondary | ICD-10-CM | POA: Diagnosis not present

## 2012-08-07 DIAGNOSIS — Z6830 Body mass index (BMI) 30.0-30.9, adult: Secondary | ICD-10-CM | POA: Diagnosis not present

## 2012-08-11 DIAGNOSIS — M5137 Other intervertebral disc degeneration, lumbosacral region: Secondary | ICD-10-CM | POA: Diagnosis not present

## 2012-08-13 ENCOUNTER — Telehealth: Payer: Self-pay

## 2012-08-13 NOTE — Telephone Encounter (Signed)
Called pt.  She will need bridging with lovenox while off coumadin.  She will come in tomorrow for INR check and bridging instructions.

## 2012-08-13 NOTE — Telephone Encounter (Signed)
Patient called and states she is getting injections in her back and needs to come off coumadin.  Would like to speak to someone about this.  Please call patient.  May need to bridge with Lovenox, is what patient states has been done in past.  Will need to come off by Saturday.

## 2012-08-14 ENCOUNTER — Ambulatory Visit (INDEPENDENT_AMBULATORY_CARE_PROVIDER_SITE_OTHER): Payer: BC Managed Care – PPO | Admitting: *Deleted

## 2012-08-14 DIAGNOSIS — Z7901 Long term (current) use of anticoagulants: Secondary | ICD-10-CM

## 2012-08-14 DIAGNOSIS — I059 Rheumatic mitral valve disease, unspecified: Secondary | ICD-10-CM

## 2012-08-14 DIAGNOSIS — Z954 Presence of other heart-valve replacement: Secondary | ICD-10-CM

## 2012-08-14 MED ORDER — ENOXAPARIN SODIUM 80 MG/0.8ML ~~LOC~~ SOLN: 80.0000 mg | Freq: Two times a day (BID) | SUBCUTANEOUS | Status: DC

## 2012-08-14 NOTE — Patient Instructions (Addendum)
6/26  Last dose coumadin 6/27  No lovenox or coumadin 7/28  Lovenox 80mg  bid 7/29  Lovenox 80mg  bid 7/30  Lovenox 80mg  bid 7/1   Lovenox 80mg  am /  Nothing in the pm 7/2   No lovenox in am / injection / coumadin 10pm 7/3  Lovenox 80mg  bid and coumadin 10mg  pm 7/4  Lovenox 80mg  bid and coumadin 10mg  pm 7/5  Lovenox 80mg  bid and coumadin 10mg  pm 7/6  Lovenox 80mg  bid and coumadin 10mg  pm 7/7  Lovenox 80mg  am / INR appt

## 2012-08-20 ENCOUNTER — Ambulatory Visit (INDEPENDENT_AMBULATORY_CARE_PROVIDER_SITE_OTHER): Payer: BC Managed Care – PPO | Admitting: *Deleted

## 2012-08-20 ENCOUNTER — Ambulatory Visit: Payer: BC Managed Care – PPO | Admitting: Pharmacist Clinician (PhC)/ Clinical Pharmacy Specialist

## 2012-08-20 DIAGNOSIS — Z954 Presence of other heart-valve replacement: Secondary | ICD-10-CM

## 2012-08-20 DIAGNOSIS — Z7901 Long term (current) use of anticoagulants: Secondary | ICD-10-CM

## 2012-08-20 DIAGNOSIS — I059 Rheumatic mitral valve disease, unspecified: Secondary | ICD-10-CM

## 2012-08-20 LAB — POCT INR: INR: 1.1

## 2012-08-25 ENCOUNTER — Ambulatory Visit (INDEPENDENT_AMBULATORY_CARE_PROVIDER_SITE_OTHER): Payer: BC Managed Care – PPO | Admitting: *Deleted

## 2012-08-25 DIAGNOSIS — Z954 Presence of other heart-valve replacement: Secondary | ICD-10-CM | POA: Diagnosis not present

## 2012-08-25 DIAGNOSIS — I059 Rheumatic mitral valve disease, unspecified: Secondary | ICD-10-CM

## 2012-08-25 DIAGNOSIS — Z7901 Long term (current) use of anticoagulants: Secondary | ICD-10-CM | POA: Diagnosis not present

## 2012-08-25 LAB — POCT INR: INR: 2.8

## 2012-08-28 ENCOUNTER — Other Ambulatory Visit: Payer: Self-pay | Admitting: *Deleted

## 2012-08-28 DIAGNOSIS — C50919 Malignant neoplasm of unspecified site of unspecified female breast: Secondary | ICD-10-CM

## 2012-08-28 DIAGNOSIS — M329 Systemic lupus erythematosus, unspecified: Secondary | ICD-10-CM

## 2012-08-28 DIAGNOSIS — E538 Deficiency of other specified B group vitamins: Secondary | ICD-10-CM

## 2012-08-28 MED ORDER — WARFARIN SODIUM 5 MG PO TABS: ORAL_TABLET | ORAL | Status: DC

## 2012-09-15 ENCOUNTER — Ambulatory Visit (INDEPENDENT_AMBULATORY_CARE_PROVIDER_SITE_OTHER): Payer: BC Managed Care – PPO | Admitting: *Deleted

## 2012-09-15 DIAGNOSIS — I059 Rheumatic mitral valve disease, unspecified: Secondary | ICD-10-CM | POA: Diagnosis not present

## 2012-09-15 DIAGNOSIS — Z7901 Long term (current) use of anticoagulants: Secondary | ICD-10-CM

## 2012-09-15 DIAGNOSIS — Z954 Presence of other heart-valve replacement: Secondary | ICD-10-CM | POA: Diagnosis not present

## 2012-09-15 LAB — POCT INR: INR: 3.4

## 2012-09-21 ENCOUNTER — Emergency Department (HOSPITAL_COMMUNITY): Payer: BC Managed Care – PPO

## 2012-09-21 ENCOUNTER — Encounter (HOSPITAL_COMMUNITY): Payer: Self-pay | Admitting: *Deleted

## 2012-09-21 DIAGNOSIS — Z853 Personal history of malignant neoplasm of breast: Secondary | ICD-10-CM | POA: Insufficient documentation

## 2012-09-21 DIAGNOSIS — Z8679 Personal history of other diseases of the circulatory system: Secondary | ICD-10-CM | POA: Insufficient documentation

## 2012-09-21 DIAGNOSIS — Z79899 Other long term (current) drug therapy: Secondary | ICD-10-CM | POA: Insufficient documentation

## 2012-09-21 DIAGNOSIS — F411 Generalized anxiety disorder: Secondary | ICD-10-CM | POA: Diagnosis not present

## 2012-09-21 DIAGNOSIS — Z87891 Personal history of nicotine dependence: Secondary | ICD-10-CM | POA: Diagnosis not present

## 2012-09-21 DIAGNOSIS — Z8669 Personal history of other diseases of the nervous system and sense organs: Secondary | ICD-10-CM | POA: Diagnosis not present

## 2012-09-21 DIAGNOSIS — E538 Deficiency of other specified B group vitamins: Secondary | ICD-10-CM | POA: Insufficient documentation

## 2012-09-21 DIAGNOSIS — D599 Acquired hemolytic anemia, unspecified: Secondary | ICD-10-CM | POA: Diagnosis not present

## 2012-09-21 DIAGNOSIS — S93409A Sprain of unspecified ligament of unspecified ankle, initial encounter: Secondary | ICD-10-CM | POA: Insufficient documentation

## 2012-09-21 DIAGNOSIS — Z8739 Personal history of other diseases of the musculoskeletal system and connective tissue: Secondary | ICD-10-CM | POA: Diagnosis not present

## 2012-09-21 DIAGNOSIS — X500XXA Overexertion from strenuous movement or load, initial encounter: Secondary | ICD-10-CM | POA: Insufficient documentation

## 2012-09-21 DIAGNOSIS — Y9301 Activity, walking, marching and hiking: Secondary | ICD-10-CM | POA: Insufficient documentation

## 2012-09-21 DIAGNOSIS — S93609A Unspecified sprain of unspecified foot, initial encounter: Secondary | ICD-10-CM | POA: Diagnosis not present

## 2012-09-21 DIAGNOSIS — Y9289 Other specified places as the place of occurrence of the external cause: Secondary | ICD-10-CM | POA: Insufficient documentation

## 2012-09-21 MED ORDER — HYDROCODONE-ACETAMINOPHEN 5-325 MG PO TABS: ORAL_TABLET | ORAL | Status: DC

## 2012-09-21 NOTE — ED Notes (Signed)
Pt with right foot and right ankle pain after twisting while walking outside a week and half ago, swelling noted to ankle

## 2012-09-21 NOTE — ED Provider Notes (Signed)
CSN: [id number]     Arrival date & time 09/21/12  2212 History     First MD Initiated Contact with Patient 09/21/12 2341     Chief Complaint  Patient presents with  . Foot Pain    HPI Pt was seen at 2340. Per pt, c/o gradual onset and persistence of constant right ankle "pain" for the past 1.5 weeks. Pt states she "twisted it" while walking outside. States she has been walking on it, but it continues to "hurt." Denies new injury. Denies knee/foot pain, no focal motor weakness, no tingling/numbness in extremities.    Past Medical History  Diagnosis Date  . SLE (systemic lupus erythematosus)   . BRCA2 negative 08/16/2010  . BRCA1 negative 08/16/2010  . 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 B12 deficiency 08/21/2010  . Mitral valve replaced     St. Jude bileaflet  . Raynaud disease   . Fibromyalgia   . Anxiety   . Peripheral neuropathy   . PONV (postoperative nausea and vomiting)   . Anticoagulation goal of INR 2.5 to 3.5   . Breast cancer     Left side   Past Surgical History  Procedure Laterality Date  . Mitral valve replacement    . Cholecystectomy    . Mastectomy    . Cardiac valve replacement    . Breast surgery    . Endometrial ablation  2008    She no longer has menses  . Insertion expander left breast  11/16/10  . Tissue expander placement  02/14/2011    Procedure: TISSUE EXPANDER;  Surgeon: Rossie Muskrat;  Location: [facility] OR;  Service: Plastics;  Laterality: Bilateral;  Left Breast Remove Tissue Expander Placement of Implant Breast Reconstruction  . Mastopexy  02/14/2011    Procedure: MASTOPEXY;  Surgeon: Rossie Muskrat;  Location: [facility] OR;  Service: Plastics;  Laterality: Bilateral;  Right Breast Reduction     History  Substance Use Topics  . Smoking status: Former Smoker -- 1.00 packs/day for 15 years    Types: Cigarettes    Quit date: 02/19/2006  . Smokeless tobacco: Never Used  . Alcohol Use: Yes     Comment: Occasional     Review of Systems ROS: Statement: All systems negative except as marked or noted in the HPI; Constitutional: Negative for fever and chills. ; ; Eyes: Negative for eye pain, redness and discharge. ; ; ENMT: Negative for ear pain, hoarseness, nasal congestion, sinus pressure and sore throat. ; ; Cardiovascular: Negative for chest pain, palpitations, diaphoresis, dyspnea and peripheral edema. ; ; Respiratory: Negative for cough, wheezing and stridor. ; ; Gastrointestinal: Negative for nausea, vomiting, diarrhea, abdominal pain, blood in stool, hematemesis, jaundice and rectal bleeding. . ; ; Genitourinary: Negative for dysuria, flank pain and hematuria. ; ; Musculoskeletal: Negative for back pain and neck pain. +right ankle pain, swelling.; ; Skin: Negative for pruritus, rash, abrasions, blisters, bruising and skin lesion.; ; Neuro: Negative for headache, lightheadedness and neck stiffness. Negative for weakness, altered level of consciousness , altered mental status, extremity weakness, paresthesias, involuntary movement, seizure and syncope.       Allergies  Review of patient's allergies indicates no known allergies.  Home Medications   Current Outpatient Rx  Name  Route  Sig  Dispense  Refill  . cyanocobalamin (,VITAMIN B-12,) 1000 MCG/ML injection   Intramuscular   Inject 1 mL (1,000 mcg total) into the muscle every 30 (thirty) days.   10 mL   prn  Needs to administer subQ   . cyclobenzaprine (FLEXERIL) 10 MG tablet      10 mg as needed.          . enoxaparin (LOVENOX) 80 MG/0.8ML injection   Subcutaneous   Inject 0.8 mLs (80 mg total) into the skin every 12 (twelve) hours.   20 Syringe   0   . folic acid (FOLVITE) 1 MG tablet   Oral   Take 1 mg by mouth daily.           . furosemide (LASIX) 40 MG tablet   Oral   Take 20 mg by mouth daily.          Marland Kitchen gabapentin (NEURONTIN) 600 MG tablet   Oral   Take 600 mg by mouth 2 (two) times daily.          Marland Kitchen  HYDROcodone-acetaminophen (NORCO/VICODIN) 5-325 MG per tablet      1 or 2 tabs PO q6 hours prn pain   20 tablet   0   . HYDROcodone-acetaminophen (VICODIN) 5-500 MG per tablet   Oral   Take 1 tablet by mouth every 6 (six) hours as needed.         . hydroxychloroquine (PLAQUENIL) 200 MG tablet   Oral   Take 200 mg by mouth 2 (two) times daily.           Marland Kitchen KLOR-CON M20 20 MEQ tablet      TAKE ONE TABLET BY MOUTH EVERY DAY   30 tablet   0   . levothyroxine (SYNTHROID, LEVOTHROID) 88 MCG tablet   Oral   Take 1 tablet by mouth Daily.         . methotrexate (RHEUMATREX) 2.5 MG tablet   Oral   Take 8 tablets by mouth Once a week. Taking on Mondays         . omeprazole (PRILOSEC) 20 MG capsule   Oral   Take 20 mg by mouth daily.         Marland Kitchen rOPINIRole (REQUIP) 0.5 MG tablet   Oral   Take 1 mg by mouth at bedtime.          Marland Kitchen warfarin (COUMADIN) 5 MG tablet      Take 1 tablet daily except 2 tablets on Tuesdays, Thursdays and Saturdays or as directed   45 tablet   6   . zolpidem (AMBIEN) 10 MG tablet   Oral   Take 10 mg by mouth at bedtime as needed.          BP 126/82  Pulse 75  Temp(Src) 98.6 F (37 C) (Oral) Physical Exam 2345: Physical examination:  Nursing notes reviewed; Vital signs and O2 SAT reviewed;  Constitutional: Well developed, Well nourished, Well hydrated, In no acute distress; Head:  Normocephalic, atraumatic; Eyes: EOMI, PERRL, No scleral icterus; ENMT: Mouth and pharynx normal, Mucous membranes moist; Neck: Supple, Full range of motion, No lymphadenopathy; Cardiovascular: Regular rate and rhythm, No murmur, rub, or gallop; Respiratory: Breath sounds clear & equal bilaterally, No rales, rhonchi, wheezes.  Speaking full sentences with ease, Normal respiratory effort/excursion; Chest: Nontender, Movement normal;  Extremities: Pulses normal, No calf edema or asymmetry. +tender to palp right lateral maleolar area w/localized edema, NMS intact right  foot, strong pedal pp, LE muscle compartments soft.  No right proximal fibular head tenderness, no knee tenderness, no foot tenderness.  No deformity, no ecchymosis, no open wounds.  +plantarflexion of right foot w/calf squeeze.  No palpable gap right Achilles's  tendon.; Neuro: AA&Ox3, Major CN grossly intact.  Speech clear. No gross focal motor or sensory deficits in extremities.; Skin: Color normal, Warm, Dry.   ED Course   Procedures   MDM  MDM Reviewed: previous chart, nursing note and vitals Interpretation: x-ray   Dg Ankle Complete Right 09/21/2012   *RADIOLOGY REPORT*  Clinical Data: Right ankle pain for 2 weeks since a twisting injury.  RIGHT ANKLE - COMPLETE 3+ VIEW  Comparison: None.  Findings: Imaged bones, joints and soft tissues appear normal.  IMPRESSION: Negative exam.   Original Report Authenticated By: Holley Dexter, M.D.   Dg Foot Complete Right 09/21/2012   *RADIOLOGY REPORT*  Clinical Data: Right foot pain for 2 weeks since twisting injury.  RIGHT FOOT COMPLETE - 3+ VIEW  Comparison: None.  Findings: No acute bony or joint abnormality is identified.  Small well-marginated sclerotic lesion in the distal third metatarsal is consistent with benign entity such as a bone island.  IMPRESSION: No acute finding.   Original Report Authenticated By: Holley Dexter, M.D.    2355:  No fx on XR, will tx symptomatically at this time. Dx and testing d/w pt and family.  Questions answered.  Verb understanding, agreeable to d/c home with outpt f/u.   Laray Anger, DO 09/24/12 1552

## 2012-09-22 ENCOUNTER — Emergency Department (HOSPITAL_COMMUNITY)
Admission: EM | Admit: 2012-09-22 | Discharge: 2012-09-22 | Disposition: A | Discharge status: Home / Self Care | Payer: BC Managed Care – PPO | Attending: Emergency Medicine | Admitting: Emergency Medicine

## 2012-09-22 DIAGNOSIS — S93401A Sprain of unspecified ligament of right ankle, initial encounter: Secondary | ICD-10-CM

## 2012-09-30 DIAGNOSIS — R7989 Other specified abnormal findings of blood chemistry: Secondary | ICD-10-CM | POA: Diagnosis not present

## 2012-10-03 DIAGNOSIS — E039 Hypothyroidism, unspecified: Secondary | ICD-10-CM | POA: Diagnosis not present

## 2012-10-03 DIAGNOSIS — G8929 Other chronic pain: Secondary | ICD-10-CM | POA: Diagnosis not present

## 2012-10-03 DIAGNOSIS — E538 Deficiency of other specified B group vitamins: Secondary | ICD-10-CM | POA: Diagnosis not present

## 2012-10-03 DIAGNOSIS — Z6831 Body mass index (BMI) 31.0-31.9, adult: Secondary | ICD-10-CM | POA: Diagnosis not present

## 2012-10-03 DIAGNOSIS — F411 Generalized anxiety disorder: Secondary | ICD-10-CM | POA: Diagnosis not present

## 2012-10-13 ENCOUNTER — Ambulatory Visit (INDEPENDENT_AMBULATORY_CARE_PROVIDER_SITE_OTHER): Payer: BC Managed Care – PPO | Admitting: *Deleted

## 2012-10-13 DIAGNOSIS — I059 Rheumatic mitral valve disease, unspecified: Secondary | ICD-10-CM

## 2012-10-13 DIAGNOSIS — Z7901 Long term (current) use of anticoagulants: Secondary | ICD-10-CM

## 2012-10-13 DIAGNOSIS — Z954 Presence of other heart-valve replacement: Secondary | ICD-10-CM

## 2012-10-17 ENCOUNTER — Other Ambulatory Visit (HOSPITAL_COMMUNITY): Payer: Self-pay | Admitting: Oncology

## 2012-10-17 DIAGNOSIS — Z139 Encounter for screening, unspecified: Secondary | ICD-10-CM

## 2012-11-03 DIAGNOSIS — R7401 Elevation of levels of liver transaminase levels: Secondary | ICD-10-CM | POA: Diagnosis not present

## 2012-11-10 ENCOUNTER — Ambulatory Visit (INDEPENDENT_AMBULATORY_CARE_PROVIDER_SITE_OTHER): Payer: BC Managed Care – PPO | Admitting: *Deleted

## 2012-11-10 DIAGNOSIS — Z6831 Body mass index (BMI) 31.0-31.9, adult: Secondary | ICD-10-CM | POA: Diagnosis not present

## 2012-11-10 DIAGNOSIS — G8929 Other chronic pain: Secondary | ICD-10-CM | POA: Diagnosis not present

## 2012-11-10 DIAGNOSIS — F411 Generalized anxiety disorder: Secondary | ICD-10-CM | POA: Diagnosis not present

## 2012-11-10 DIAGNOSIS — E538 Deficiency of other specified B group vitamins: Secondary | ICD-10-CM | POA: Diagnosis not present

## 2012-11-10 DIAGNOSIS — Z7901 Long term (current) use of anticoagulants: Secondary | ICD-10-CM

## 2012-11-10 DIAGNOSIS — Z954 Presence of other heart-valve replacement: Secondary | ICD-10-CM | POA: Diagnosis not present

## 2012-11-10 DIAGNOSIS — I059 Rheumatic mitral valve disease, unspecified: Secondary | ICD-10-CM

## 2012-11-10 DIAGNOSIS — Z23 Encounter for immunization: Secondary | ICD-10-CM | POA: Diagnosis not present

## 2012-11-12 ENCOUNTER — Ambulatory Visit (HOSPITAL_COMMUNITY)
Admission: RE | Admit: 2012-11-12 | Discharge: 2012-11-12 | Disposition: A | Discharge status: Home / Self Care | Payer: BC Managed Care – PPO | Source: Ambulatory Visit | Attending: Cardiology | Admitting: Cardiology

## 2012-11-12 DIAGNOSIS — I050 Rheumatic mitral stenosis: Secondary | ICD-10-CM | POA: Insufficient documentation

## 2012-11-12 DIAGNOSIS — Z87891 Personal history of nicotine dependence: Secondary | ICD-10-CM | POA: Insufficient documentation

## 2012-11-12 DIAGNOSIS — Z853 Personal history of malignant neoplasm of breast: Secondary | ICD-10-CM | POA: Diagnosis not present

## 2012-11-12 DIAGNOSIS — I059 Rheumatic mitral valve disease, unspecified: Secondary | ICD-10-CM

## 2012-11-12 DIAGNOSIS — I517 Cardiomegaly: Secondary | ICD-10-CM | POA: Diagnosis not present

## 2012-11-12 NOTE — Progress Notes (Signed)
*  PRELIMINARY RESULTS* Echocardiogram 2D Echocardiogram has been performed.  Kaymarie Wynn 11/12/2012, 1:41 PM

## 2012-11-14 ENCOUNTER — Ambulatory Visit (INDEPENDENT_AMBULATORY_CARE_PROVIDER_SITE_OTHER): Payer: BC Managed Care – PPO | Admitting: Adult Health

## 2012-11-14 ENCOUNTER — Encounter: Payer: Self-pay | Admitting: Adult Health

## 2012-11-14 ENCOUNTER — Other Ambulatory Visit: Payer: Self-pay | Admitting: Adult Health

## 2012-11-14 VITALS — BP 122/86 | HR 82 | Ht 63.5 in | Wt 186.1 lb

## 2012-11-14 DIAGNOSIS — R0989 Other specified symptoms and signs involving the circulatory and respiratory systems: Secondary | ICD-10-CM

## 2012-11-14 DIAGNOSIS — R0609 Other forms of dyspnea: Secondary | ICD-10-CM

## 2012-11-14 DIAGNOSIS — I059 Rheumatic mitral valve disease, unspecified: Secondary | ICD-10-CM

## 2012-11-14 DIAGNOSIS — R0600 Dyspnea, unspecified: Secondary | ICD-10-CM | POA: Insufficient documentation

## 2012-11-14 DIAGNOSIS — R0602 Shortness of breath: Secondary | ICD-10-CM

## 2012-11-14 DIAGNOSIS — G4700 Insomnia, unspecified: Secondary | ICD-10-CM | POA: Diagnosis not present

## 2012-11-14 DIAGNOSIS — M329 Systemic lupus erythematosus, unspecified: Secondary | ICD-10-CM

## 2012-11-14 LAB — BASIC METABOLIC PANEL
CO2: 27 mEq/L (ref 19–32)
Calcium: 9.5 mg/dL (ref 8.4–10.5)
Creat: 0.88 mg/dL (ref 0.50–1.10)
Glucose, Bld: 87 mg/dL (ref 70–99)

## 2012-11-14 NOTE — Progress Notes (Signed)
HPI: [name] is a 42 year old female patient of Dr. Diona Browner we are following for ongoing assessment and management of mitral valve disorders with a history of systemic lupus. Patient is last seen in the office in March of 2014. She has a mechanical mitral valve replaced at [facility] in 2008. I last visit the patient was without complaint and hemodynamically stable. She was continued on Coumadin with close followup in the Coumadin clinic. Echocardiogram was ordered prior to this visit.    This demonstrated the left ventricle was normal in size that had pattern of mild LVH. Systolic function was normal with an EF of 50-55%. There were no regional wall motion abnormalities. There was a reduced contribution of atrial contraction to the ventricular filling and due to increased ventricular diastolic pressure or atrial contractile dysfunction. The findings were consistent with left ventricular diastolic dysfunction. Doppler parameters are consistent with high ventricular filling pressures. The mitral valve mechanical prosthesis was present. It is well seated it appeared to be functioning normally. There was no evidence of paravalvular leak.    She comes today with complaints of dyspnea. She states it has been worsening over the last couple of months. She also is complaining of frequent palpitations, which she mostly notices at nighttime. She admits to some caffeine use streaky several Mr. Theresia Lo during the day. She is also complaining of some weakness in her calves. No pain.    No Known Allergies  Current Outpatient Prescriptions  Medication Sig Dispense Refill  . cyanocobalamin (,VITAMIN B-12,) 1000 MCG/ML injection Inject 1 mL (1,000 mcg total) into the muscle every 30 (thirty) days.  10 mL  prn  . cyclobenzaprine (FLEXERIL) 10 MG tablet 10 mg as needed.       . folic acid (FOLVITE) 1 MG tablet Take 1 mg by mouth daily.        . furosemide (LASIX) 40 MG tablet Take 20 mg by mouth daily.       Marland Kitchen gabapentin  (NEURONTIN) 600 MG tablet Take 600 mg by mouth 2 (two) times daily.       . hydroxychloroquine (PLAQUENIL) 200 MG tablet Take 200 mg by mouth 2 (two) times daily.        Marland Kitchen KLOR-CON M20 20 MEQ tablet TAKE ONE TABLET BY MOUTH EVERY DAY  30 tablet  0  . levothyroxine (SYNTHROID, LEVOTHROID) 88 MCG tablet Take 1 tablet by mouth Daily.      . methotrexate (RHEUMATREX) 2.5 MG tablet Take 8 tablets by mouth Once a week. Taking on Mondays      . omeprazole (PRILOSEC) 20 MG capsule Take 20 mg by mouth daily.      Marland Kitchen oxyCODONE-acetaminophen (PERCOCET/ROXICET) 5-325 MG per tablet Take 1 tablet by mouth every 8 (eight) hours as needed.       . promethazine (PHENERGAN) 25 MG tablet Take 1 tablet (25 mg total) by mouth every 4 (four) hours as needed for nausea.  20 tablet  0  . rOPINIRole (REQUIP) 0.5 MG tablet Take 1 mg by mouth at bedtime.       Marland Kitchen warfarin (COUMADIN) 5 MG tablet Take 1 tablet daily except 2 tablets on Tuesdays, Thursdays and Saturdays or as directed  45 tablet  6  . zolpidem (AMBIEN) 10 MG tablet Take 10 mg by mouth at bedtime as needed.       No current facility-administered medications for this visit.    Past Medical History  Diagnosis Date  . SLE (systemic lupus erythematosus)   .  BRCA2 negative 08/16/2010  . BRCA1 negative 08/16/2010  . 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 B12 deficiency 08/21/2010  . Mitral valve replaced     St. Jude bileaflet  . Raynaud disease   . Fibromyalgia   . Anxiety   . Peripheral neuropathy   . PONV (postoperative nausea and vomiting)   . Anticoagulation goal of INR 2.5 to 3.5   . Breast cancer     Left side    Past Surgical History  Procedure Laterality Date  . Mitral valve replacement    . Cholecystectomy    . Mastectomy    . Cardiac valve replacement    . Breast surgery    . Endometrial ablation  2008    She no longer has menses  . Insertion expander left breast  11/16/10  . Tissue expander placement   02/14/2011    Procedure: TISSUE EXPANDER;  Surgeon: Rossie Muskrat;  Location: [facility] OR;  Service: Plastics;  Laterality: Bilateral;  Left Breast Remove Tissue Expander Placement of Implant Breast Reconstruction  . Mastopexy  02/14/2011    Procedure: MASTOPEXY;  Surgeon: Rossie Muskrat;  Location: [facility] OR;  Service: Plastics;  Laterality: Bilateral;  Right Breast Reduction     ZOX:WRUEAV of systems complete and found to be negative unless listed above  PHYSICAL EXAM BP 122/86  Pulse 82  Ht 5' 3.5" (1.613 m)  Wt 186 lb 1.3 oz (84.405 kg)  BMI 32.44 kg/m2  General: Well developed, well nourished, in no acute distress Head: Eyes PERRLA, No xanthomas.   Normal cephalic and atramatic  Lungs: Clear bilaterally to auscultation and percussion. Heart: HRRR S1 S2, with systolic murmur. Pulses are 2+ & equal.            No carotid bruit. No JVD.  No abdominal bruits. No femoral bruits. Abdomen: Bowel sounds are positive, abdomen soft and non-tender without masses or                  Hernia's noted. Msk:  Back normal, normal gait. Normal strength and tone for age. Extremities: No clubbing, cyanosis or edema.  DP +1 Neuro: Alert and oriented X 3. Psych:  Good affect, responds appropriately  EKG: NSR with T-wave flattening in the lateral leads.  ASSESSMENT AND PLAN

## 2012-11-14 NOTE — Assessment & Plan Note (Signed)
Having several somatic complaints include generalized weakness fatigue, neuralgia and dyspnea. She is due to followup with rheumatologist in one month.

## 2012-11-14 NOTE — Progress Notes (Deleted)
Name: [name]    DOB: 04-30-1970  Age: 42 y.o.  MR#: [id number]       PCP:  Colette Ribas, MD      Insurance: Payor: BLUE CROSS BLUE SHIELD / Plan: BCBS [place] PPO / Product Type: *No Product type* /   CC:    Chief Complaint  Patient presents with  . Mitral Valve Prolapse    MV disorder    VS Filed Vitals:   11/14/12 1521  BP: 122/86  Pulse: 82  Height: 5' 3.5" (1.613 m)  Weight: 186 lb 1.3 oz (84.405 kg)    Weights Current Weight  11/14/12 186 lb 1.3 oz (84.405 kg)  06/24/12 181 lb 6.4 oz (82.283 kg)  05/12/12 179 lb (81.194 kg)    Blood Pressure  BP Readings from Last 3 Encounters:  11/14/12 122/86  09/21/12 126/82  06/24/12 133/80     Admit date:  (Not on file) Last encounter with RMR:  Visit date not found   Allergy Review of patient's allergies indicates no known allergies.  Current Outpatient Prescriptions  Medication Sig Dispense Refill  . cyanocobalamin (,VITAMIN B-12,) 1000 MCG/ML injection Inject 1 mL (1,000 mcg total) into the muscle every 30 (thirty) days.  10 mL  prn  . cyclobenzaprine (FLEXERIL) 10 MG tablet 10 mg as needed.       . folic acid (FOLVITE) 1 MG tablet Take 1 mg by mouth daily.        . furosemide (LASIX) 40 MG tablet Take 20 mg by mouth daily.       Marland Kitchen gabapentin (NEURONTIN) 600 MG tablet Take 600 mg by mouth 2 (two) times daily.       . hydroxychloroquine (PLAQUENIL) 200 MG tablet Take 200 mg by mouth 2 (two) times daily.        Marland Kitchen KLOR-CON M20 20 MEQ tablet TAKE ONE TABLET BY MOUTH EVERY DAY  30 tablet  0  . levothyroxine (SYNTHROID, LEVOTHROID) 88 MCG tablet Take 1 tablet by mouth Daily.      . methotrexate (RHEUMATREX) 2.5 MG tablet Take 8 tablets by mouth Once a week. Taking on Mondays      . omeprazole (PRILOSEC) 20 MG capsule Take 20 mg by mouth daily.      Marland Kitchen oxyCODONE-acetaminophen (PERCOCET/ROXICET) 5-325 MG per tablet Take 1 tablet by mouth every 8 (eight) hours as needed.       . promethazine (PHENERGAN) 25 MG tablet Take  1 tablet (25 mg total) by mouth every 4 (four) hours as needed for nausea.  20 tablet  0  . rOPINIRole (REQUIP) 0.5 MG tablet Take 1 mg by mouth at bedtime.       Marland Kitchen warfarin (COUMADIN) 5 MG tablet Take 1 tablet daily except 2 tablets on Tuesdays, Thursdays and Saturdays or as directed  45 tablet  6  . zolpidem (AMBIEN) 10 MG tablet Take 10 mg by mouth at bedtime as needed.       No current facility-administered medications for this visit.    Discontinued Meds:    Medications Discontinued During This Encounter  Medication Reason  . enoxaparin (LOVENOX) 80 MG/0.8ML injection Completed Course  . HYDROcodone-acetaminophen (NORCO/VICODIN) 5-325 MG per tablet Error  . HYDROcodone-acetaminophen (VICODIN) 5-500 MG per tablet Error  . promethazine (PHENERGAN) 25 MG tablet Error    Patient Active Problem List   Diagnosis Date Noted  . Anticoagulation goal of INR 2.5 to 3.5   . Mitral valve replaced   . Insomnia  12/19/2010  . Peripheral neuropathy   . B12 deficiency 08/21/2010  . 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 BRCA1 negative 08/16/2010  . BRCA2 negative 08/16/2010  . Encounter for long-term (current) use of anticoagulants 05/17/2010  . BREAST CANCER 02/24/2010  . FEVER PRESENTING CONDITIONS CLASSIFIED ELSEWHERE 02/24/2010  . CHOLECYSTECTOMY, LAPAROSCOPIC, HX OF 02/24/2010  . Mitral valve disorders 11/10/2009  . Systemic lupus erythematosus 11/10/2009    LABS    Component Value Date/Time   NA 138 06/24/2012 1046   NA 139 06/25/2011 1103   NA 139 02/05/2011 1330   K 3.7 06/24/2012 1046   K 3.2* 06/25/2011 1103   K 3.8 02/05/2011 1330   CL 104 06/24/2012 1046   CL 107 06/25/2011 1103   CL 105 02/05/2011 1330   CO2 27 06/24/2012 1046   CO2 23 06/25/2011 1103   CO2 27 02/05/2011 1330   GLUCOSE 76 06/24/2012 1046   GLUCOSE 90 06/25/2011 1103   GLUCOSE 66* 02/05/2011 1330   BUN 11 06/24/2012 1046   BUN 8 06/25/2011 1103   BUN 12 02/05/2011 1330   CREATININE  0.75 06/24/2012 1046   CREATININE 0.64 06/25/2011 1103   CREATININE 0.77 02/05/2011 1330   CREATININE 0.59 11/22/2010 0910   CALCIUM 9.3 06/24/2012 1046   CALCIUM 9.1 06/25/2011 1103   CALCIUM 9.5 02/05/2011 1330   GFRNONAA >90 06/24/2012 1046   GFRNONAA >90 06/25/2011 1103   GFRNONAA >90 02/05/2011 1330   GFRAA >90 06/24/2012 1046   GFRAA >90 06/25/2011 1103   GFRAA >90 02/05/2011 1330   CMP     Component Value Date/Time   NA 138 06/24/2012 1046   K 3.7 06/24/2012 1046   CL 104 06/24/2012 1046   CO2 27 06/24/2012 1046   GLUCOSE 76 06/24/2012 1046   BUN 11 06/24/2012 1046   CREATININE 0.75 06/24/2012 1046   CREATININE 0.59 11/22/2010 0910   CALCIUM 9.3 06/24/2012 1046   PROT 7.8 06/24/2012 1046   ALBUMIN 3.9 06/24/2012 1046   AST 18 06/24/2012 1046   ALT 14 06/24/2012 1046   ALKPHOS 72 06/24/2012 1046   BILITOT 0.5 06/24/2012 1046   GFRNONAA >90 06/24/2012 1046   GFRAA >90 06/24/2012 1046       Component Value Date/Time   WBC 5.0 06/24/2012 1046   WBC 5.0 06/25/2011 1103   WBC 7.1 02/05/2011 1330   HGB 12.1 06/24/2012 1046   HGB 11.1* 06/25/2011 1103   HGB 11.1* 02/05/2011 1330   HCT 35.0* 06/24/2012 1046   HCT 32.7* 06/25/2011 1103   HCT 33.7* 02/05/2011 1330   MCV 92.6 06/24/2012 1046   MCV 89.6 06/25/2011 1103   MCV 87.1 02/05/2011 1330    Lipid Panel     Component Value Date/Time   CHOL 229* 09/18/2010 1156   TRIG 111 09/18/2010 1156   HDL 47 09/18/2010 1156   CHOLHDL 4.9 09/18/2010 1156   VLDL 22 09/18/2010 1156   LDLCALC 160* 09/18/2010 1156    ABG No results found for this basename: phart, pco2, pco2art, po2, po2art, hco3, tco2, acidbasedef, o2sat     Lab Results  Component Value Date   TSH 3.291 09/18/2010   BNP (last 3 results) No results found for this basename: PROBNP,  in the last 8760 hours Cardiac Panel (last 3 results) No results found for this basename: CKTOTAL, CKMB, TROPONINI, RELINDX,  in the last 72 hours  Iron/TIBC/Ferritin    Component Value Date/Time   IRON 51 06/24/2012 1046  TIBC 292  06/24/2012 1046   FERRITIN 110 06/24/2012 1046     EKG Orders placed in visit on 05/12/12  . EKG 12-LEAD     Prior Assessment and Plan Problem List as of 11/14/2012     Cardiovascular and Mediastinum   Mitral valve disorders   Last Assessment & Plan   06/18/2011 Office Visit Written 06/18/2011  2:53 PM by June Leap, MD     Patient is doing well. She reports no chest pain or shortness of breath. Mitral valve sounds normal on physical examination.    Mitral valve replaced   Last Assessment & Plan   05/12/2012 Office Visit Written 05/12/2012  2:38 PM by Jonelle Sidle, MD     10 status post St. Jude mechanical prosthesis it is in 2008 as outlined. Patient is doing well clinically, examination reveals expected heart sounds. She should continue Coumadin with close followup in the Coumadin clinic. We will arrange a followup echocardiogram for next visit in 9 months, 3 years from the previous study.      Digestive   B12 deficiency     Nervous and Auditory   Peripheral neuropathy   Last Assessment & Plan   06/18/2011 Office Visit Written 06/18/2011  2:54 PM by June Leap, MD     Patient continues to have some pain in the lower extremities and she is already on high-dose gabapentin. I suggested that she could try tramadol 100 mg by mouth twice a day when necessary pain      Other   Systemic lupus erythematosus   Last Assessment & Plan   05/12/2012 Office Visit Written 05/12/2012  2:40 PM by Jonelle Sidle, MD     Keep regular followup with primary care physician.    BREAST CANCER   Last Assessment & Plan   05/12/2012 Office Visit Written 05/12/2012  2:39 PM by Jonelle Sidle, MD     Status post chemotherapy and left mastectomy, in remission, followed by Dr. Mariel Sleet.    FEVER PRESENTING CONDITIONS CLASSIFIED ELSEWHERE   CHOLECYSTECTOMY, LAPAROSCOPIC, HX OF   Encounter for long-term (current) use of anticoagulants   Last Assessment & Plan   06/18/2011 Office Visit Written  06/18/2011  2:54 PM by June Leap, MD     Patient continues to be followed in the Coumadin clinic. She reports no bleeding complications.    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    Anticoagulation goal of INR 2.5 to 3.5       Imaging: No results found.

## 2012-11-14 NOTE — Patient Instructions (Signed)
Your physician recommends that you schedule a follow-up appointment in: after tests with Dr Diona Browner  Your physician recommends that you return for lab work in: BMET, PRO BNP,   Your physician has recommended that you have a pulmonary function test. Pulmonary Function Tests are a group of tests that measure how well air moves in and out of your lungs.   Your physician has recommended that you have a sleep study. This test records several body functions during sleep, including: brain activity, eye movement, oxygen and carbon dioxide blood levels, heart rate and rhythm, breathing rate and rhythm, the flow of air through your mouth and nose, snoring, body muscle movements, and chest and belly movement.

## 2012-11-14 NOTE — Assessment & Plan Note (Signed)
Echocardiogram demonstrated high  ventricular filling pressures. She is having some dyspnea on exertion, admits to snoring and possible apnea at night although this never been tested. We will schedule the patient for PFTs and a sleep study for evaluation for sleep apnea and lung disease. She will followup with Dr. Diona Browner for discussion of test results in the setting of elevated ventricular filling pressures on echo.

## 2012-11-14 NOTE — Assessment & Plan Note (Addendum)
Echocardiogram demonstrates normal functioning mitral valve without perivalvular leak.

## 2012-11-15 LAB — PRO B NATRIURETIC PEPTIDE: Pro B Natriuretic peptide (BNP): 243.9 pg/mL — ABNORMAL HIGH (ref <126)

## 2012-11-24 DIAGNOSIS — G609 Hereditary and idiopathic neuropathy, unspecified: Secondary | ICD-10-CM | POA: Diagnosis not present

## 2012-11-24 DIAGNOSIS — M329 Systemic lupus erythematosus, unspecified: Secondary | ICD-10-CM | POA: Diagnosis not present

## 2012-11-24 DIAGNOSIS — M064 Inflammatory polyarthropathy: Secondary | ICD-10-CM | POA: Diagnosis not present

## 2012-11-24 DIAGNOSIS — R0602 Shortness of breath: Secondary | ICD-10-CM | POA: Diagnosis not present

## 2012-11-24 DIAGNOSIS — M2550 Pain in unspecified joint: Secondary | ICD-10-CM | POA: Diagnosis not present

## 2012-11-25 ENCOUNTER — Ambulatory Visit: Payer: BC Managed Care – PPO | Attending: Adult Health | Admitting: Sleep Medicine

## 2012-11-25 DIAGNOSIS — R0602 Shortness of breath: Secondary | ICD-10-CM

## 2012-11-25 DIAGNOSIS — G4700 Insomnia, unspecified: Secondary | ICD-10-CM

## 2012-11-25 DIAGNOSIS — G4710 Hypersomnia, unspecified: Secondary | ICD-10-CM | POA: Insufficient documentation

## 2012-11-25 DIAGNOSIS — Z6832 Body mass index (BMI) 32.0-32.9, adult: Secondary | ICD-10-CM | POA: Diagnosis not present

## 2012-11-26 ENCOUNTER — Ambulatory Visit (HOSPITAL_COMMUNITY)
Admission: RE | Admit: 2012-11-26 | Discharge: 2012-11-26 | Disposition: A | Discharge status: Home / Self Care | Payer: BC Managed Care – PPO | Source: Ambulatory Visit | Attending: Adult Health | Admitting: Adult Health

## 2012-11-26 DIAGNOSIS — R0602 Shortness of breath: Secondary | ICD-10-CM | POA: Diagnosis not present

## 2012-11-26 MED ORDER — ALBUTEROL SULFATE (5 MG/ML) 0.5% IN NEBU
2.5000 mg | INHALATION_SOLUTION | Freq: Once | RESPIRATORY_TRACT | Status: AC
  Administered 2012-11-26: 2.5 mg via RESPIRATORY_TRACT

## 2012-12-01 NOTE — Procedures (Signed)
[name], [name] NO.:  [id number]  MEDICAL RECORD NO.:  [id number]  LOCATION:                                 FACILITY:  PHYSICIAN:  Yanette Tripoli L. Juanetta Gosling, M.D.DATE OF BIRTH:  03-31-1970  DATE OF PROCEDURE:  11/28/2012 DATE OF DISCHARGE:                           PULMONARY FUNCTION TEST   Reason for pulmonary function testing is shortness of breath. 1. Spirometry shows a moderate ventilatory defect with evidence of     airflow obstruction. 2. Lung volumes show mildly reduced total lung capacity. 3. DLCO is severely reduced and improved somewhat when ventilation is     taken into account. 4. Airway resistance is normal. 5. There is no significant bronchodilator improvement. 6. This study shows airflow obstruction.     Cheron Pasquarelli L. Juanetta Gosling, M.D.     ELH/MEDQ  D:  11/28/2012  T:  11/29/2012  Job:  [id number]  cc:   Bettey Mare. Lyman Bishop, NP

## 2012-12-02 NOTE — Procedures (Signed)
[facility] Linell Meldrum A. Gerilyn Pilgrim, MD     [email or web address]         NAMEJASIME, WESTERGREN               ACCOUNT NO.:  [id number]  MEDICAL RECORD NO.:  [id number]          PATIENT TYPE:  OUT  LOCATION:  SLEEP LAB                     FACILITY:  [facility]  PHYSICIAN:  Jammi Morrissette A. Gerilyn Pilgrim, M.D. DATE OF BIRTH:  05/04/1970  DATE OF STUDY:  11/25/2012                           NOCTURNAL POLYSOMNOGRAM  REFERRING PHYSICIAN:  Bettey Mare. Lawrence, NP  INDICATION:  A 42 year old, who presents with fatigue, difficulty sleeping, and hypertension.  There is also history of being overweight.   MEDICATIONS:  Ropinirole, hydrochlorothiazide, warfarin, gabapentin, methotrexate, Percocet, Phenergan, Synthroid, Lasix, zolpidem.  EPWORTH SLEEPINESS SCALE:  3.  BMI:  32.  ARCHITECTURAL SUMMARY:  The total recording time is 387 minutes.  Sleep efficiency 80%, sleep latency 30 minutes, REM latency 134 minutes. Stage N1 10%, N2 73%, N3 1% and REM sleep 17%.  RESPIRATORY SUMMARY:  Baseline oxygen saturation is 97, lowest saturation 89 during REM sleep.  Diagnostic AHI is 3 and RDI 4.  LIMB MOVEMENT SUMMARY:  PLM index 2.  ELECTROCARDIOGRAM SUMMARY:  Average heart rate is 75 with no significant dysrhythmias observed.  IMPRESSION:  Unremarkable nocturnal polysomnography.  Thank you for this referral.    Belvin Gauss A. Gerilyn Pilgrim, M.D.    KAD/MEDQ  D:  12/02/2012 09:00:44  T:  12/02/2012 09:14:48  Job:  [id number]

## 2012-12-08 ENCOUNTER — Ambulatory Visit (INDEPENDENT_AMBULATORY_CARE_PROVIDER_SITE_OTHER): Payer: BC Managed Care – PPO | Admitting: *Deleted

## 2012-12-08 DIAGNOSIS — Z7901 Long term (current) use of anticoagulants: Secondary | ICD-10-CM | POA: Diagnosis not present

## 2012-12-08 DIAGNOSIS — Z954 Presence of other heart-valve replacement: Secondary | ICD-10-CM | POA: Diagnosis not present

## 2012-12-08 DIAGNOSIS — I059 Rheumatic mitral valve disease, unspecified: Secondary | ICD-10-CM | POA: Diagnosis not present

## 2012-12-08 LAB — POCT INR: INR: 1.8

## 2012-12-09 DIAGNOSIS — G8929 Other chronic pain: Secondary | ICD-10-CM | POA: Diagnosis not present

## 2012-12-09 DIAGNOSIS — Z6831 Body mass index (BMI) 31.0-31.9, adult: Secondary | ICD-10-CM | POA: Diagnosis not present

## 2012-12-09 DIAGNOSIS — E538 Deficiency of other specified B group vitamins: Secondary | ICD-10-CM | POA: Diagnosis not present

## 2012-12-18 ENCOUNTER — Encounter: Payer: Self-pay | Admitting: Cardiology

## 2012-12-18 ENCOUNTER — Ambulatory Visit (INDEPENDENT_AMBULATORY_CARE_PROVIDER_SITE_OTHER): Payer: BC Managed Care – PPO | Admitting: Cardiology

## 2012-12-18 VITALS — BP 108/77 | HR 80 | Ht 63.0 in | Wt 180.0 lb

## 2012-12-18 DIAGNOSIS — R0602 Shortness of breath: Secondary | ICD-10-CM | POA: Diagnosis not present

## 2012-12-18 DIAGNOSIS — I059 Rheumatic mitral valve disease, unspecified: Secondary | ICD-10-CM

## 2012-12-18 DIAGNOSIS — R942 Abnormal results of pulmonary function studies: Secondary | ICD-10-CM

## 2012-12-18 MED ORDER — FUROSEMIDE 40 MG PO TABS: 40.0000 mg | ORAL_TABLET | Freq: Every day | ORAL | Status: DC

## 2012-12-18 NOTE — Assessment & Plan Note (Signed)
Suspect multifactorial based on testing so far. She does have diastolic dysfunction with increased filling pressures, we will try and increase her Lasix to 40 mg daily and continue potassium supplements. In addition her PFTs are abnormal as discussed above, and I will refer her to see Dr. Juanetta Gosling for a formal pulmonary evaluation. Followup arranged.

## 2012-12-18 NOTE — Patient Instructions (Addendum)
Your physician recommends that you schedule a follow-up appointment in: one month   Increase Lasix to 40 mg per day  ( keep your  potassium dose the same)   See Dr.hawkins for consultation

## 2012-12-18 NOTE — Progress Notes (Signed)
Clinical Summary Ms. Corral is a 42 y.o.female recently seen in the office by Ms. Lawrence NP back in September. At that time the patient reported dyspnea on exertion and followup testing was arranged.  Formal sleep study was unremarkable. PFTs demonstrated a moderate ventilatory defect with air flow obstruction, mildly reduced total lung capacity, severely reduced DLCO. Recent echocardiogram demonstrated mild LVH with LVEF 50-55%, diastolic dysfunction with increased filling pressures, stable St. Jude mitral prosthesis with mean gradient 4 mm mercury and no paravalvular leak, mild left atrial enlargement.  Lab work from September reviewed showing pro-BNP 243, potassium 4.1, BUN 9, creatinine 0.8, sodium 139.  We reviewed the results of her testing. She mainly seems to complain of shortness of breath and a feeling of fullness at nighttime, sometimes after she eats a meal. Has a feeling of weakness in her legs sometimes, still trying to walk for exercise. She is not reporting any chest pain or palpitations. She does not specifically endorse any wheezing or history of asthma. No known environmental allergies.   No Known Allergies  Current Outpatient Prescriptions  Medication Sig Dispense Refill  . cyanocobalamin (,VITAMIN B-12,) 1000 MCG/ML injection Inject 1 mL (1,000 mcg total) into the muscle every 30 (thirty) days.  10 mL  prn  . cyclobenzaprine (FLEXERIL) 10 MG tablet 10 mg as needed.       . folic acid (FOLVITE) 1 MG tablet Take 1 mg by mouth daily.        Marland Kitchen gabapentin (NEURONTIN) 600 MG tablet Take 600 mg by mouth 2 (two) times daily.       . hydroxychloroquine (PLAQUENIL) 200 MG tablet Take 200 mg by mouth 2 (two) times daily.        Marland Kitchen KLOR-CON M20 20 MEQ tablet TAKE ONE TABLET BY MOUTH EVERY DAY  30 tablet  0  . levothyroxine (SYNTHROID, LEVOTHROID) 88 MCG tablet Take 1 tablet by mouth Daily.      . methotrexate (RHEUMATREX) 2.5 MG tablet Once a week. Taking on Mondays      .  omeprazole (PRILOSEC) 20 MG capsule Take 20 mg by mouth daily.      Marland Kitchen oxyCODONE-acetaminophen (PERCOCET/ROXICET) 5-325 MG per tablet Take 1 tablet by mouth every 8 (eight) hours as needed.       Marland Kitchen rOPINIRole (REQUIP) 0.5 MG tablet Take 1 mg by mouth at bedtime.       Marland Kitchen warfarin (COUMADIN) 5 MG tablet Take 1 tablet daily except 2 tablets on Tuesdays, Thursdays and Saturdays or as directed  45 tablet  6  . zolpidem (AMBIEN) 10 MG tablet Take 10 mg by mouth at bedtime as needed.      . furosemide (LASIX) 40 MG tablet Take 1 tablet (40 mg total) by mouth daily.  90 tablet  3  . promethazine (PHENERGAN) 25 MG tablet Take 1 tablet (25 mg total) by mouth every 4 (four) hours as needed for nausea.  20 tablet  0   No current facility-administered medications for this visit.    Past Medical History  Diagnosis Date  . SLE (systemic lupus erythematosus)   . BRCA2 negative   . BRCA1 negative   . 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 B12 deficiency   . Mitral valve disease, rheumatic     St. Jude prosthesis  . Raynaud disease   . Fibromyalgia   . Anxiety   . Peripheral neuropathy   . PONV (postoperative nausea and vomiting)   .  Anticoagulation goal of INR 2.5 to 3.5   . Breast cancer     Left side    Social History Ms. Kress reports that she quit smoking about 6 years ago. Her smoking use included Cigarettes. She has a 15 pack-year smoking history. She has never used smokeless tobacco. Ms. Cogliano reports that she drinks alcohol.  Review of Systems Restless legs at nighttime. Negative except as outlined above.  Physical Examination Filed Vitals:   12/18/12 1100  BP: 108/77  Pulse: 80   Filed Weights   12/18/12 1100  Weight: 180 lb (81.647 kg)    No acute distress.  HEENT: Conjunctiva and lids normal, oropharynx clear.  Neck: Supple, no elevated JVP or carotid bruits, no thyromegaly.  Lungs: Clear to auscultation, nonlabored breathing at rest.    Cardiac: Regular rate and rhythm, crisp prosthetic sound in S1, no S3 or significant systolic murmur, no pericardial rub.  Abdomen: Soft, nontender, bowel sounds present, no guarding or rebound.  Extremities: No pitting edema, distal pulses 2+.  Skin: Warm and dry.  Musculoskeletal: No kyphosis.  Neuropsychiatric: Alert and oriented x3, affect grossly appropriate.   Problem List and Plan   Dyspnea Suspect multifactorial based on testing so far. She does have diastolic dysfunction with increased filling pressures, we will try and increase her Lasix to 40 mg daily and continue potassium supplements. In addition her PFTs are abnormal as discussed above, and I will refer her to see Dr. Juanetta Gosling for a formal pulmonary evaluation. Followup arranged.  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 She continues on Coumadin, recent echocardiogram shows stable mitral prosthesis with normal mean gradient and no paravalvular leak.    Jonelle Sidle, M.D., F.A.C.C.

## 2012-12-18 NOTE — Assessment & Plan Note (Signed)
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 She continues on Coumadin, recent echocardiogram shows stable mitral prosthesis with normal mean gradient and no paravalvular leak.

## 2012-12-22 ENCOUNTER — Encounter (HOSPITAL_COMMUNITY): Payer: BC Managed Care – PPO | Attending: Hematology and Oncology

## 2012-12-22 DIAGNOSIS — M329 Systemic lupus erythematosus, unspecified: Secondary | ICD-10-CM | POA: Diagnosis not present

## 2012-12-22 DIAGNOSIS — Z09 Encounter for follow-up examination after completed treatment for conditions other than malignant neoplasm: Secondary | ICD-10-CM | POA: Insufficient documentation

## 2012-12-22 DIAGNOSIS — E538 Deficiency of other specified B group vitamins: Secondary | ICD-10-CM

## 2012-12-22 DIAGNOSIS — Z853 Personal history of malignant neoplasm of breast: Secondary | ICD-10-CM | POA: Insufficient documentation

## 2012-12-22 DIAGNOSIS — D594 Other nonautoimmune hemolytic anemias: Secondary | ICD-10-CM

## 2012-12-22 DIAGNOSIS — C50919 Malignant neoplasm of unspecified site of unspecified female breast: Secondary | ICD-10-CM

## 2012-12-22 LAB — COMPREHENSIVE METABOLIC PANEL
ALT: 18 U/L (ref 0–35)
AST: 20 U/L (ref 0–37)
Albumin: 3.7 g/dL (ref 3.5–5.2)
CO2: 29 mEq/L (ref 19–32)
Calcium: 9.3 mg/dL (ref 8.4–10.5)
Creatinine, Ser: 0.68 mg/dL (ref 0.50–1.10)
GFR calc non Af Amer: >90 mL/min (ref >90)
Total Protein: 7.9 g/dL (ref 6.0–8.3)

## 2012-12-22 LAB — CBC WITH DIFFERENTIAL/PLATELET
Basophils Absolute: 0 10*3/uL (ref 0.0–0.1)
HCT: 36.3 % (ref 36.0–46.0)
Hemoglobin: 12.4 g/dL (ref 12.0–15.0)
Lymphocytes Relative: 17 % (ref 12–46)
Lymphs Abs: 1.1 10*3/uL (ref 0.7–4.0)
Monocytes Absolute: 0.3 10*3/uL (ref 0.1–1.0)
Monocytes Relative: 5 % (ref 3–12)
Neutro Abs: 4.8 10*3/uL (ref 1.7–7.7)
RBC: 3.89 MIL/uL (ref 3.87–5.11)
WBC: 6.4 10*3/uL (ref 4.0–10.5)

## 2012-12-22 LAB — RETICULOCYTES
RBC.: 3.89 MIL/uL (ref 3.87–5.11)
Retic Ct Pct: 1.2 % (ref 0.4–3.1)

## 2012-12-22 LAB — LACTATE DEHYDROGENASE: LDH: 349 U/L — ABNORMAL HIGH (ref 94–250)

## 2012-12-22 LAB — VITAMIN B12: Vitamin B-12: 892 pg/mL (ref 211–911)

## 2012-12-22 NOTE — Progress Notes (Signed)
Labs drawn today for [id number],cea,ferr,sed rate, b12,ldh,retic,cbc/diff,cmp,haptoglobin

## 2012-12-23 ENCOUNTER — Telehealth: Payer: Self-pay

## 2012-12-23 LAB — CANCER ANTIGEN 27.29: CA 27.29: 23 U/mL (ref 0–39)

## 2012-12-23 LAB — CEA: CEA: 1 ng/mL (ref 0.0–5.0)

## 2012-12-23 NOTE — Telephone Encounter (Signed)
Patient asked about scheduling appointment with Dr. Juanetta Gosling office.  Had PFT and Sleep Study test done.  Dr. Diona Browner recommended patient see Dr. Juanetta Gosling for abnormal PFT.

## 2012-12-24 ENCOUNTER — Encounter (HOSPITAL_COMMUNITY): Payer: Self-pay

## 2012-12-24 ENCOUNTER — Ambulatory Visit (INDEPENDENT_AMBULATORY_CARE_PROVIDER_SITE_OTHER): Payer: Medicare Other | Admitting: *Deleted

## 2012-12-24 ENCOUNTER — Encounter (HOSPITAL_BASED_OUTPATIENT_CLINIC_OR_DEPARTMENT_OTHER): Payer: BC Managed Care – PPO

## 2012-12-24 VITALS — BP 122/89 | HR 76 | Temp 97.3°F | Resp 20 | Wt 182.0 lb

## 2012-12-24 DIAGNOSIS — C50919 Malignant neoplasm of unspecified site of unspecified female breast: Secondary | ICD-10-CM

## 2012-12-24 DIAGNOSIS — D591 Autoimmune hemolytic anemia, unspecified: Secondary | ICD-10-CM

## 2012-12-24 DIAGNOSIS — Z954 Presence of other heart-valve replacement: Secondary | ICD-10-CM

## 2012-12-24 DIAGNOSIS — I059 Rheumatic mitral valve disease, unspecified: Secondary | ICD-10-CM

## 2012-12-24 DIAGNOSIS — M329 Systemic lupus erythematosus, unspecified: Secondary | ICD-10-CM

## 2012-12-24 DIAGNOSIS — Z7901 Long term (current) use of anticoagulants: Secondary | ICD-10-CM

## 2012-12-24 DIAGNOSIS — D594 Other nonautoimmune hemolytic anemias: Secondary | ICD-10-CM

## 2012-12-24 DIAGNOSIS — E538 Deficiency of other specified B group vitamins: Secondary | ICD-10-CM

## 2012-12-24 LAB — POCT INR: INR: 2.2

## 2012-12-24 NOTE — Patient Instructions (Signed)
[facility] Discharge Instructions  RECOMMENDATIONS MADE BY THE CONSULTANT AND ANY TEST RESULTS WILL BE SENT TO YOUR REFERRING PHYSICIAN.  Return in 6 months for repeat lab work and MD appointment.  Increase gabapentin (Neurontin) as instructed by Dr. Zigmund Daniel.    Thank you for choosing [facility] to provide your oncology and hematology care.  To afford each patient quality time with our providers, please arrive at least 15 minutes before your scheduled appointment time.  With your help, our goal is to use those 15 minutes to complete the necessary work-up to ensure our physicians have the information they need to help with your evaluation and healthcare recommendations.    Effective January 1st, 2014, we ask that you re-schedule your appointment with our physicians should you arrive 10 or more minutes late for your appointment.  We strive to give you quality time with our providers, and arriving late affects you and other patients whose appointments are after yours.    Again, thank you for choosing [facility].  Our hope is that these requests will decrease the amount of time that you wait before being seen by our physicians.       _____________________________________________________________  Should you have questions after your visit to [facility], please contact our office at [phone] between the hours of 8:30 a.m. and 5:00 p.m.  Voicemails left after 4:30 p.m. will not be returned until the following business day.  For prescription refill requests, have your pharmacy contact our office with your prescription refill request.

## 2012-12-24 NOTE — Progress Notes (Signed)
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   CURRENT THERAPY: No active antineoplastic therapy at this time.  INTERVAL HISTORY: [name] 42 y.o. female returns for followup of triple negative breast cancer in the setting of systemic lupus erythematosus and status post mitral valve replacement, hemolytic anemia, and B12 deficiency.  Appetite is good with no nausea or vomiting. Normal bowel movements with no melena, hematochezia, hematuria. Peripheral neuropathy persists taking gabapentin 600 mg twice a day. She denies any fever, night sweats, dark urine, lower extremity swelling or redness, but does have joint discomfort continuously with a 7/10 in severity. She denies any vaginal bleeding, abnormalities on suppressive examination, lymphedema, but does have skin rash involving her face with no headache or seizures. She received 5 and B12 from her family physician on a monthly basis. Undergoes regular ophthalmologic evaluation.  MEDICAL HISTORY: Past Medical History  Diagnosis Date  . SLE (systemic lupus erythematosus)   . BRCA2 negative   . BRCA1 negative   . 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 B12 deficiency   . Mitral valve disease, rheumatic     St. Jude prosthesis  . Raynaud disease   . Fibromyalgia   . Anxiety   . Peripheral neuropathy   . PONV (postoperative nausea and vomiting)   . Anticoagulation goal of INR 2.5  to 3.5   . 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   Left-sided breast cancer, triple negative, 3.8 cm in size, left upper quadrant mass with no obvious adenopathy. She took 2 cycles of FEC followed by mastectomy on 04/20/2010. This showed no disease in the lymph nodes but still she had a 1.6 cm cancer, grade 3. Margins were clear. Your receptors negative, PR receptors negative, HER-2/neu negative. Ki-67 marker high at 92%. LV I was not identified. She was then treated with Taxotere and carboplatin for 4 cycles with difficulty tolerating the chemotherapy. She did receive a total 6 cycles, 2 before surgery. 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 difficile diarrhea in the past  #6 cholecystectomy in the past  #7 mitral valve replacement has a few bowel for 2 years ago with a history of CHF still on lifelong anticoagulation. She has a history of rheumatic heart disease as a child  #8 BRCA1 and BRCA2 negative  #9 hypothyroidism  #10 B12 deficiency on monthly B12 shots.   ALLERGIES:  has No Known Allergies.  MEDICATIONS: has a current medication list which includes the following prescription(s): cyanocobalamin, cyclobenzaprine, folic acid, furosemide, gabapentin, hydroxychloroquine, klor-con m20, levothyroxine, methotrexate,  omeprazole, oxycodone-acetaminophen, promethazine, ropinirole, warfarin, and zolpidem.  SURGICAL HISTORY:  Past Surgical History  Procedure Laterality Date  . Mitral  valve replacement    . Cholecystectomy    . Mastectomy    . Cardiac valve replacement    . Breast surgery    . Endometrial ablation  2008    She no longer has menses  . Insertion expander left breast  11/16/10  . Tissue expander placement  02/14/2011    Procedure: TISSUE EXPANDER;  Surgeon: Rossie Muskrat;  Location: [facility] OR;  Service: Plastics;  Laterality: Bilateral;  Left Breast Remove Tissue Expander Placement of Implant Breast Reconstruction  . Mastopexy  02/14/2011    Procedure: MASTOPEXY;  Surgeon: Rossie Muskrat;  Location: [facility] OR;  Service: Plastics;  Laterality: Bilateral;  Right Breast Reduction     FAMILY HISTORY: family history is not on file.  SOCIAL HISTORY:  reports that she quit smoking about 6 years ago. Her smoking use included Cigarettes. She has a 15 pack-year smoking history. She has never used smokeless tobacco. She reports that she drinks alcohol. She reports that she does not use illicit drugs.  REVIEW OF SYSTEMS:  Other than that discussed above is noncontributory.  PHYSICAL EXAMINATION: ECOG PERFORMANCE STATUS: 1 - Symptomatic but completely ambulatory  There were no vitals taken for this visit.  GENERAL:alert, no distress and comfortable SKIN: skin color, texture, turgor are normal, no rashes or significant lesions EYES: PERLA; Conjunctiva are pink and non-injected, sclera clear OROPHARYNX:no exudate, no erythema on lips, buccal mucosa, or tongue. NECK: supple, thyroid normal size, non-tender, without nodularity. No masses CHEST: Status post left breast reconstruction with right breast reduction, no evidence of mass in the right breast or in either axilla. LYMPH:  no palpable lymphadenopathy in the cervical, axillary or inguinal LUNGS: clear to auscultation and percussion with normal breathing effort HEART: regular rate & rhythm and no murmurs. ABDOMEN:abdomen soft, non-tender and normal bowel sounds MUSCULOSKELETAL:no cyanosis of digits and no clubbing.  Range of motion normal. The muscle wasting in both upper and lower extremities. NEURO: alert & oriented x 3 with fluent speech, no focal motor/sensory deficits   LABORATORY DATA: Infusion on 12/22/2012  Component Date Value Range Status  . WBC 12/22/2012 6.4  4.0 - 10.5 K/uL Final  . RBC 12/22/2012 3.89  3.87 - 5.11 MIL/uL Final  . Hemoglobin 12/22/2012 12.4  12.0 - 15.0 g/dL Final  . HCT 16/11/9602 36.3  36.0 - 46.0 % Final  . MCV 12/22/2012 93.3  78.0 - 100.0 fL Final  . MCH 12/22/2012 31.9  26.0 - 34.0 pg Final  . MCHC 12/22/2012 34.2  30.0 - 36.0 g/dL Final  . RDW 54/10/8117 13.5  11.5 - 15.5 % Final  . Platelets 12/22/2012 162  150 - 400 K/uL Final  . Neutrophils Relative % 12/22/2012 75  43 - 77 % Final  . Neutro Abs 12/22/2012 4.8  1.7 - 7.7 K/uL Final  . Lymphocytes Relative 12/22/2012 17  12 - 46 % Final  . Lymphs Abs 12/22/2012 1.1  0.7 - 4.0 K/uL Final  . Monocytes Relative 12/22/2012 5  3 - 12 % Final  . Monocytes Absolute 12/22/2012 0.3  0.1 - 1.0 K/uL Final  . Eosinophils Relative 12/22/2012 3  0 - 5 % Final  . Eosinophils Absolute 12/22/2012 0.2  0.0 - 0.7 K/uL Final  . Basophils Relative 12/22/2012 1  0 - 1 % Final  . Basophils Absolute 12/22/2012 0.0  0.0 - 0.1  K/uL Final  . Sodium 12/22/2012 139  135 - 145 mEq/L Final  . Potassium 12/22/2012 3.6  3.5 - 5.1 mEq/L Final  . Chloride 12/22/2012 102  96 - 112 mEq/L Final  . CO2 12/22/2012 29  19 - 32 mEq/L Final  . Glucose, Bld 12/22/2012 87  70 - 99 mg/dL Final  . BUN 16/11/9602 13  6 - 23 mg/dL Final  . Creatinine, Ser 12/22/2012 0.68  0.50 - 1.10 mg/dL Final  . Calcium 54/10/8117 9.3  8.4 - 10.5 mg/dL Final  . Total Protein 12/22/2012 7.9  6.0 - 8.3 g/dL Final  . Albumin 14/78/2956 3.7  3.5 - 5.2 g/dL Final  . AST 21/30/8657 20  0 - 37 U/L Final  . ALT 12/22/2012 18  0 - 35 U/L Final  . Alkaline Phosphatase 12/22/2012 69  39 - 117 U/L Final  . Total Bilirubin 12/22/2012 0.3  0.3 - 1.2 mg/dL Final  . GFR calc  non Af Amer 12/22/2012 >90  >90 mL/min Final  . GFR calc Af Amer 12/22/2012 >90  >90 mL/min Final   Comment: (NOTE)                          The eGFR has been calculated using the CKD EPI equation.                          This calculation has not been validated in all clinical situations.                          eGFR's persistently <90 mL/min signify possible Chronic Kidney                          Disease.  Marland Kitchen LDH 12/22/2012 349* 94 - 250 U/L Final  . Retic Ct Pct 12/22/2012 1.2  0.4 - 3.1 % Final  . RBC. 12/22/2012 3.89  3.87 - 5.11 MIL/uL Final  . Retic Count, Manual 12/22/2012 46.7  19.0 - 186.0 K/uL Final  . Haptoglobin 12/22/2012 <25* 45 - 215 mg/dL Final   Performed at [facility]  . CA 27.29 12/22/2012 23  0 - 39 U/mL Final   Performed at [facility]  . CEA 12/22/2012 1.0  0.0 - 5.0 ng/mL Final   Performed at [facility]  . Ferritin 12/22/2012 154  10 - 291 ng/mL Final   Performed at [facility]  . Sed Rate 12/22/2012 30* 0 - 22 mm/hr Final  . Vitamin B-12 12/22/2012 892  211 - 911 pg/mL Final   Performed at [facility]  Anti-coag visit on 12/08/2012  Component Date Value Range Status  . INR 12/08/2012 1.8   Final    PATHOLOGY: None new.  Urinalysis    Component Value Date/Time   COLORURINE YELLOW 09/06/2010 1600   APPEARANCEUR CLEAR 09/06/2010 1600   LABSPEC 1.015 09/06/2010 1600   PHURINE 5.5 09/06/2010 1600   GLUCOSEU NEGATIVE 09/06/2010 1600   HGBUR TRACE* 09/06/2010 1600   BILIRUBINUR NEGATIVE 09/06/2010 1600   KETONESUR NEGATIVE 09/06/2010 1600   PROTEINUR NEGATIVE 09/06/2010 1600   UROBILINOGEN 0.2 09/06/2010 1600   NITRITE NEGATIVE 09/06/2010 1600   LEUKOCYTESUR NEGATIVE 09/06/2010 1600    RADIOGRAPHIC STUDIES: No results found.  ASSESSMENT:  #1.left-sided breast cancer, triple negative, 3.8 cm in size, left upper quadrant mass with no  obvious adenopathy. She took 2 cycles of FEC followed by mastectomy on 04/20/2010.  This showed no disease in the lymph nodes but still she had a 1.6 cm cancer, grade 3. Margins were clear. Your receptors negative, PR receptors negative, HER-2/neu negative. Ki-67 marker high at 92%. LV I was not identified. She was then treated with Taxotere and carboplatin for 4 cycles with difficulty tolerating the chemotherapy. She did receive a total 6 cycles 2 before surgery and 4 after.  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  #4 septic episodes after cycles 1 and cycle 2 of chemotherapy  #5 C. difficile diarrhea in the past  #6 cholecystectomy in the past  #7 mitral valve replacement has a few bowel for 2 years ago with a history of CHF still on lifelong anticoagulation. She has a history of rheumatic heart disease as a child  #8 BRCA1 and BRCA2 negative  #9 hypothyroidism, on treatment. #10 B12 deficiency on monthly B12 shots    PLAN:  #1. Try to improve neuropathic pain by increasing gabapentin to 1800 mg daily with the idea that maximal dose without adverse effects would be 3600 mg per day. #2. Mammogram scheduled for next week. #3. Continue methotrexate and Plaquenil with reduced dose of methotrexate from previous transaminitis. #4. Followup in 6 months with lab tests.   All questions were answered. The patient knows to call the clinic with any problems, questions or concerns. We can certainly see the patient much sooner if necessary.   I spent 25 minutes counseling the patient face to face. The total time spent in the appointment was 30 minutes.    Maurilio Lovely, MD 12/24/2012 8:03 AM

## 2012-12-25 ENCOUNTER — Other Ambulatory Visit: Payer: Self-pay

## 2013-01-08 ENCOUNTER — Ambulatory Visit (HOSPITAL_COMMUNITY)
Admission: RE | Admit: 2013-01-08 | Discharge: 2013-01-08 | Disposition: A | Discharge status: Home / Self Care | Payer: BC Managed Care – PPO | Source: Ambulatory Visit | Attending: Oncology | Admitting: Oncology

## 2013-01-08 DIAGNOSIS — Z1231 Encounter for screening mammogram for malignant neoplasm of breast: Secondary | ICD-10-CM | POA: Insufficient documentation

## 2013-01-08 DIAGNOSIS — Z139 Encounter for screening, unspecified: Secondary | ICD-10-CM

## 2013-01-09 DIAGNOSIS — E538 Deficiency of other specified B group vitamins: Secondary | ICD-10-CM | POA: Diagnosis not present

## 2013-01-09 DIAGNOSIS — J449 Chronic obstructive pulmonary disease, unspecified: Secondary | ICD-10-CM | POA: Diagnosis not present

## 2013-01-09 DIAGNOSIS — G8929 Other chronic pain: Secondary | ICD-10-CM | POA: Diagnosis not present

## 2013-01-09 DIAGNOSIS — E039 Hypothyroidism, unspecified: Secondary | ICD-10-CM | POA: Diagnosis not present

## 2013-01-09 DIAGNOSIS — Z6832 Body mass index (BMI) 32.0-32.9, adult: Secondary | ICD-10-CM | POA: Diagnosis not present

## 2013-01-19 ENCOUNTER — Encounter: Payer: Self-pay | Admitting: Cardiology

## 2013-01-19 ENCOUNTER — Ambulatory Visit (INDEPENDENT_AMBULATORY_CARE_PROVIDER_SITE_OTHER): Payer: BC Managed Care – PPO | Admitting: Cardiology

## 2013-01-19 ENCOUNTER — Ambulatory Visit (INDEPENDENT_AMBULATORY_CARE_PROVIDER_SITE_OTHER): Payer: BC Managed Care – PPO | Admitting: *Deleted

## 2013-01-19 VITALS — BP 121/82 | HR 63 | Ht 64.0 in | Wt 187.0 lb

## 2013-01-19 DIAGNOSIS — I059 Rheumatic mitral valve disease, unspecified: Secondary | ICD-10-CM | POA: Diagnosis not present

## 2013-01-19 DIAGNOSIS — Z7901 Long term (current) use of anticoagulants: Secondary | ICD-10-CM | POA: Diagnosis not present

## 2013-01-19 DIAGNOSIS — R0609 Other forms of dyspnea: Secondary | ICD-10-CM

## 2013-01-19 DIAGNOSIS — R0989 Other specified symptoms and signs involving the circulatory and respiratory systems: Secondary | ICD-10-CM

## 2013-01-19 DIAGNOSIS — R0600 Dyspnea, unspecified: Secondary | ICD-10-CM

## 2013-01-19 DIAGNOSIS — Z954 Presence of other heart-valve replacement: Secondary | ICD-10-CM

## 2013-01-19 LAB — POCT INR: INR: 4.9

## 2013-01-19 NOTE — Progress Notes (Signed)
Clinical Summary [name] is a 42 y.o.female last seen in October. At that time Lasix dose was increased and she was also referred for pulmonary evaluation - she states that she sees [name] tomorrow. Reports no major change in her feeling of bloating and occasional shortness of breath. She has not had any leg edema. The increase in Lasix has not improved her symptoms. Weight is actually up compared to the last visit.  Recent PFTs demonstrated a moderate ventilatory defect with air flow obstruction, mildly reduced total lung capacity, severely reduced DLCO. Recent echocardiogram demonstrated mild LVH with LVEF 50-55%, diastolic dysfunction with increased filling pressures, stable St. Jude mitral prosthesis with mean gradient 4 mm mercury and no paravalvular leak, mild left atrial enlargement.  Recent lab work showed hemoglobin 12.4, platelets 162, potassium 3.6, BUN 13, creatinine 0.6.    No Known Allergies  Current Outpatient Prescriptions  Medication Sig Dispense Refill  . cyanocobalamin (,VITAMIN B-12,) 1000 MCG/ML injection Inject 1 mL (1,000 mcg total) into the muscle every 30 (thirty) days.  10 mL  prn  . cyclobenzaprine (FLEXERIL) 10 MG tablet 10 mg as needed.       . folic acid (FOLVITE) 1 MG tablet Take 1 mg by mouth daily.        . furosemide (LASIX) 40 MG tablet Take 1 tablet (40 mg total) by mouth daily.  90 tablet  3  . gabapentin (NEURONTIN) 600 MG tablet Take 600 mg by mouth 2 (two) times daily.       . hydroxychloroquine (PLAQUENIL) 200 MG tablet Take 200 mg by mouth 2 (two) times daily.        Marland Kitchen KLOR-CON M20 20 MEQ tablet TAKE ONE TABLET BY MOUTH EVERY DAY  30 tablet  0  . levothyroxine (SYNTHROID, LEVOTHROID) 88 MCG tablet Take 1 tablet by mouth Daily.      . methotrexate (RHEUMATREX) 2.5 MG tablet Once a week. Taking on Mondays      . omeprazole (PRILOSEC) 20 MG capsule Take 20 mg by mouth daily.      Marland Kitchen oxyCODONE-acetaminophen (PERCOCET/ROXICET) 5-325 MG per  tablet Take 1 tablet by mouth every 8 (eight) hours as needed.       Marland Kitchen rOPINIRole (REQUIP) 0.5 MG tablet Take 1 mg by mouth at bedtime.       Marland Kitchen warfarin (COUMADIN) 5 MG tablet Take 1 tablet daily except 2 tablets on Tuesdays, Thursdays and Saturdays or as directed  45 tablet  6  . zolpidem (AMBIEN) 10 MG tablet Take 10 mg by mouth at bedtime as needed.      . promethazine (PHENERGAN) 25 MG tablet Take 1 tablet (25 mg total) by mouth every 4 (four) hours as needed for nausea.  20 tablet  0   No current facility-administered medications for this visit.    Past Medical History  Diagnosis Date  . SLE (systemic lupus erythematosus)   . BRCA2 negative   . BRCA1 negative   . 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 B12 deficiency   . Mitral valve disease, rheumatic     St. Jude prosthesis  . Raynaud disease   . Fibromyalgia   . Anxiety   . Peripheral neuropathy   . PONV (postoperative nausea and vomiting)   . Anticoagulation goal of INR 2.5 to 3.5   . Breast cancer     Left side    Social History [name] reports that she quit smoking about 6 years  ago. Her smoking use included Cigarettes. She has a 15 pack-year smoking history. She has never used smokeless tobacco. [name] reports that she drinks alcohol.  Review of Systems No palpitations or dizziness. No exertional chest pain. Otherwise as outlined.  Physical Examination Filed Vitals:   01/19/13 0941  BP: 121/82  Pulse: 63   Filed Weights   01/19/13 0941  Weight: 187 lb (84.823 kg)    Appears comfortable at rest.  HEENT: Conjunctiva and lids normal, oropharynx clear.  Neck: Supple, no elevated JVP or carotid bruits, no thyromegaly.  Lungs: Clear to auscultation, nonlabored breathing at rest.  Cardiac: Regular rate and rhythm, crisp prosthetic sound in S1, no S3 or significant systolic murmur, no pericardial rub.  Abdomen: Soft, nontender, bowel sounds present, no guarding or rebound.    Extremities: No pitting edema, distal pulses 2+.  Skin: Warm and dry.  Musculoskeletal: No kyphosis.  Neuropsychiatric: Alert and oriented x3, affect grossly appropriate.   Problem List and Plan   Dyspnea At this point doubt cardiac etiology. Recent echocardiogram reviewed above, stable St. Jude mitral prosthesis and gradient. She did have abnormal PFTs and has a consultation visit with [name] this week. No change to current medications., Followup arranged.  Mitral valve disorders History of rheumatic mitral valve disease status post St. Jude mechanical replacement. Stable by recent echocardiogram. She continues on Coumadin.    Jonelle Sidle, M.D., F.A.C.C.

## 2013-01-19 NOTE — Patient Instructions (Addendum)
Your physician recommends that you schedule a follow-up appointment in: 3 MONTHS  Your physician recommends that you continue on your current medications as directed. Please refer to the Current Medication list given to you today.   

## 2013-01-19 NOTE — Assessment & Plan Note (Signed)
History of rheumatic mitral valve disease status post St. Jude mechanical replacement. Stable by recent echocardiogram. She continues on Coumadin.

## 2013-01-19 NOTE — Assessment & Plan Note (Signed)
At this point doubt cardiac etiology. Recent echocardiogram reviewed above, stable St. Jude mitral prosthesis and gradient. She did have abnormal PFTs and has a consultation visit with Dr. Juanetta Gosling this week. No change to current medications., Followup arranged.

## 2013-01-20 DIAGNOSIS — R635 Abnormal weight gain: Secondary | ICD-10-CM | POA: Diagnosis not present

## 2013-01-20 DIAGNOSIS — M329 Systemic lupus erythematosus, unspecified: Secondary | ICD-10-CM | POA: Diagnosis not present

## 2013-01-20 DIAGNOSIS — J449 Chronic obstructive pulmonary disease, unspecified: Secondary | ICD-10-CM | POA: Diagnosis not present

## 2013-01-20 DIAGNOSIS — Z954 Presence of other heart-valve replacement: Secondary | ICD-10-CM | POA: Diagnosis not present

## 2013-01-28 ENCOUNTER — Other Ambulatory Visit (HOSPITAL_COMMUNITY): Payer: Self-pay | Admitting: Pulmonary Disease

## 2013-01-28 DIAGNOSIS — R0602 Shortness of breath: Secondary | ICD-10-CM

## 2013-01-30 ENCOUNTER — Ambulatory Visit (HOSPITAL_COMMUNITY)
Admission: RE | Admit: 2013-01-30 | Discharge: 2013-01-30 | Disposition: A | Discharge status: Home / Self Care | Payer: BC Managed Care – PPO | Source: Ambulatory Visit | Attending: Pulmonary Disease | Admitting: Pulmonary Disease

## 2013-01-30 DIAGNOSIS — R0602 Shortness of breath: Secondary | ICD-10-CM | POA: Insufficient documentation

## 2013-01-30 DIAGNOSIS — M329 Systemic lupus erythematosus, unspecified: Secondary | ICD-10-CM | POA: Insufficient documentation

## 2013-01-30 DIAGNOSIS — Z853 Personal history of malignant neoplasm of breast: Secondary | ICD-10-CM | POA: Diagnosis not present

## 2013-01-30 MED ORDER — IOHEXOL 300 MG/ML  SOLN
100.0000 mL | Freq: Once | INTRAMUSCULAR | Status: AC | PRN
  Administered 2013-01-30: 80 mL via INTRAVENOUS

## 2013-02-02 ENCOUNTER — Other Ambulatory Visit (HOSPITAL_COMMUNITY): Payer: Medicare Other

## 2013-02-02 ENCOUNTER — Ambulatory Visit (INDEPENDENT_AMBULATORY_CARE_PROVIDER_SITE_OTHER): Payer: BC Managed Care – PPO | Admitting: *Deleted

## 2013-02-02 DIAGNOSIS — I059 Rheumatic mitral valve disease, unspecified: Secondary | ICD-10-CM | POA: Diagnosis not present

## 2013-02-02 DIAGNOSIS — Z954 Presence of other heart-valve replacement: Secondary | ICD-10-CM | POA: Diagnosis not present

## 2013-02-02 DIAGNOSIS — Z7901 Long term (current) use of anticoagulants: Secondary | ICD-10-CM

## 2013-02-02 LAB — POCT INR: INR: 3

## 2013-02-04 DIAGNOSIS — R0602 Shortness of breath: Secondary | ICD-10-CM | POA: Diagnosis not present

## 2013-02-04 DIAGNOSIS — R635 Abnormal weight gain: Secondary | ICD-10-CM | POA: Diagnosis not present

## 2013-02-04 DIAGNOSIS — F411 Generalized anxiety disorder: Secondary | ICD-10-CM | POA: Diagnosis not present

## 2013-02-05 ENCOUNTER — Other Ambulatory Visit (HOSPITAL_COMMUNITY): Payer: Self-pay | Admitting: Pulmonary Disease

## 2013-02-05 DIAGNOSIS — R109 Unspecified abdominal pain: Secondary | ICD-10-CM

## 2013-02-10 ENCOUNTER — Encounter (HOSPITAL_COMMUNITY): Payer: Self-pay

## 2013-02-10 ENCOUNTER — Ambulatory Visit (HOSPITAL_COMMUNITY)
Admission: RE | Admit: 2013-02-10 | Discharge: 2013-02-10 | Disposition: A | Discharge status: Home / Self Care | Payer: BC Managed Care – PPO | Source: Ambulatory Visit | Attending: Pulmonary Disease | Admitting: Pulmonary Disease

## 2013-02-10 DIAGNOSIS — Z6832 Body mass index (BMI) 32.0-32.9, adult: Secondary | ICD-10-CM | POA: Diagnosis not present

## 2013-02-10 DIAGNOSIS — R0602 Shortness of breath: Secondary | ICD-10-CM | POA: Insufficient documentation

## 2013-02-10 DIAGNOSIS — R109 Unspecified abdominal pain: Secondary | ICD-10-CM

## 2013-02-10 DIAGNOSIS — Z853 Personal history of malignant neoplasm of breast: Secondary | ICD-10-CM | POA: Diagnosis not present

## 2013-02-10 DIAGNOSIS — E785 Hyperlipidemia, unspecified: Secondary | ICD-10-CM | POA: Diagnosis not present

## 2013-02-10 DIAGNOSIS — R1900 Intra-abdominal and pelvic swelling, mass and lump, unspecified site: Secondary | ICD-10-CM | POA: Diagnosis not present

## 2013-02-10 DIAGNOSIS — Z0000 Encounter for general adult medical examination without abnormal findings: Secondary | ICD-10-CM | POA: Diagnosis not present

## 2013-02-10 DIAGNOSIS — R1909 Other intra-abdominal and pelvic swelling, mass and lump: Secondary | ICD-10-CM | POA: Insufficient documentation

## 2013-02-10 DIAGNOSIS — G8929 Other chronic pain: Secondary | ICD-10-CM | POA: Diagnosis not present

## 2013-02-10 DIAGNOSIS — E538 Deficiency of other specified B group vitamins: Secondary | ICD-10-CM | POA: Diagnosis not present

## 2013-02-10 MED ORDER — SODIUM CHLORIDE 0.9 % IJ SOLN
INTRAMUSCULAR | Status: AC
  Filled 2013-02-10: qty 500

## 2013-02-10 MED ORDER — IOHEXOL 300 MG/ML  SOLN
100.0000 mL | Freq: Once | INTRAMUSCULAR | Status: AC | PRN
  Administered 2013-02-10: 100 mL via INTRAVENOUS

## 2013-02-17 DIAGNOSIS — R0602 Shortness of breath: Secondary | ICD-10-CM | POA: Diagnosis not present

## 2013-02-17 DIAGNOSIS — R635 Abnormal weight gain: Secondary | ICD-10-CM | POA: Diagnosis not present

## 2013-02-17 DIAGNOSIS — E039 Hypothyroidism, unspecified: Secondary | ICD-10-CM | POA: Diagnosis not present

## 2013-02-25 ENCOUNTER — Ambulatory Visit (INDEPENDENT_AMBULATORY_CARE_PROVIDER_SITE_OTHER): Payer: BC Managed Care – PPO | Admitting: *Deleted

## 2013-02-25 DIAGNOSIS — Z7901 Long term (current) use of anticoagulants: Secondary | ICD-10-CM

## 2013-02-25 DIAGNOSIS — I059 Rheumatic mitral valve disease, unspecified: Secondary | ICD-10-CM

## 2013-02-25 DIAGNOSIS — Z954 Presence of other heart-valve replacement: Secondary | ICD-10-CM

## 2013-02-25 LAB — POCT INR: INR: 2.9

## 2013-03-10 DIAGNOSIS — Z6831 Body mass index (BMI) 31.0-31.9, adult: Secondary | ICD-10-CM | POA: Diagnosis not present

## 2013-03-10 DIAGNOSIS — E538 Deficiency of other specified B group vitamins: Secondary | ICD-10-CM | POA: Diagnosis not present

## 2013-03-10 DIAGNOSIS — E039 Hypothyroidism, unspecified: Secondary | ICD-10-CM | POA: Diagnosis not present

## 2013-03-10 DIAGNOSIS — G8929 Other chronic pain: Secondary | ICD-10-CM | POA: Diagnosis not present

## 2013-03-11 ENCOUNTER — Ambulatory Visit (INDEPENDENT_AMBULATORY_CARE_PROVIDER_SITE_OTHER): Payer: BC Managed Care – PPO | Admitting: *Deleted

## 2013-03-11 DIAGNOSIS — I059 Rheumatic mitral valve disease, unspecified: Secondary | ICD-10-CM | POA: Diagnosis not present

## 2013-03-11 DIAGNOSIS — Z954 Presence of other heart-valve replacement: Secondary | ICD-10-CM | POA: Diagnosis not present

## 2013-03-11 DIAGNOSIS — Z7901 Long term (current) use of anticoagulants: Secondary | ICD-10-CM

## 2013-03-11 LAB — POCT INR: INR: 1.5

## 2013-03-12 ENCOUNTER — Telehealth: Payer: Self-pay

## 2013-03-12 ENCOUNTER — Ambulatory Visit (INDEPENDENT_AMBULATORY_CARE_PROVIDER_SITE_OTHER): Payer: BC Managed Care – PPO | Admitting: Gastroenterology

## 2013-03-12 ENCOUNTER — Encounter: Payer: Self-pay | Admitting: Gastroenterology

## 2013-03-12 ENCOUNTER — Encounter (HOSPITAL_COMMUNITY): Payer: Self-pay | Admitting: Pharmacy Technician

## 2013-03-12 VITALS — BP 102/67 | HR 65 | Temp 97.6°F | Wt 182.6 lb

## 2013-03-12 DIAGNOSIS — R1013 Epigastric pain: Secondary | ICD-10-CM

## 2013-03-12 DIAGNOSIS — K3189 Other diseases of stomach and duodenum: Secondary | ICD-10-CM

## 2013-03-12 DIAGNOSIS — R195 Other fecal abnormalities: Secondary | ICD-10-CM

## 2013-03-12 MED ORDER — DICYCLOMINE HCL 10 MG PO CAPS: 10.0000 mg | ORAL_CAPSULE | Freq: Three times a day (TID) | ORAL | Status: DC

## 2013-03-12 NOTE — Assessment & Plan Note (Signed)
Chronic postprandial bowel movements, no change in baseline. No rectal bleeding. Check celiac serologies. No colonoscopy unless evidence of IDA or overt GI bleeding. Add Bentyl for supportive measures.

## 2013-03-12 NOTE — Progress Notes (Signed)
Referring Provider: Halford Chessman, MD Primary Care Physician:  Purvis Kilts, MD Primary Gastroenterologist:  Dr. Gala Romney   Chief Complaint  Patient presents with  . Abdominal Pain    HPI:   [name] presents today at the request of Dr. Hilma Favors secondary to abdominal pain. Symptoms present for several months. Stomach swells just with small bites of food. Occurs every time eating. Notes epigastric discomfort, tightness, pain. Only associated with eating. Intermittent nausea, no vomiting. Sometimes nausea exacerbated by eating. No dysphagia. No melena. No bright red blood per rectum. Leans more towards diarrhea than constipation but goes to the bathroom "regular". Postprandial bowel movements. Chronic. Prilosec daily. Chronic.  History of breast cancer. No prior EGD. No prior colonoscopy.   Past Medical History  Diagnosis Date  . SLE (systemic lupus erythematosus)   . BRCA2 negative   . BRCA1 negative   . 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 B12 deficiency   . Mitral valve disease, rheumatic     St. Jude prosthesis  . Raynaud disease   . Fibromyalgia   . Anxiety   . Peripheral neuropathy   . PONV (postoperative nausea and vomiting)   . Anticoagulation goal of INR 2.5 to 3.5   . Breast cancer 2011    Left side    Past Surgical History  Procedure Laterality Date  . Mitral valve replacement    . Cholecystectomy    . Mastectomy    . Cardiac valve replacement    . Breast surgery    . Endometrial ablation  2008    She no longer has menses  . Insertion expander left breast  11/16/10  . Tissue expander placement  02/14/2011    Procedure: TISSUE EXPANDER;  Surgeon: Macon Large;  Location: [facility];  Service: Plastics;  Laterality: Bilateral;  Left Breast Remove Tissue Expander Placement of Implant Breast Reconstruction  . Mastopexy  02/14/2011    Procedure: MASTOPEXY;  Surgeon: Macon Large;  Location: [facility];  Service: Plastics;   Laterality: Bilateral;  Right Breast Reduction     Current Outpatient Prescriptions  Medication Sig Dispense Refill  . cyanocobalamin (,VITAMIN B-12,) 1000 MCG/ML injection Inject 1 mL (1,000 mcg total) into the muscle every 30 (thirty) days.  10 mL  prn  . cyclobenzaprine (FLEXERIL) 10 MG tablet 10 mg as needed.       . furosemide (LASIX) 40 MG tablet Take 1 tablet (40 mg total) by mouth daily.  90 tablet  3  . gabapentin (NEURONTIN) 600 MG tablet Take 600 mg by mouth 2 (two) times daily.       . hydroxychloroquine (PLAQUENIL) 200 MG tablet Take 200 mg by mouth 2 (two) times daily.        Marland Kitchen KLOR-CON M20 20 MEQ tablet TAKE ONE TABLET BY MOUTH EVERY DAY  30 tablet  0  . leflunomide (ARAVA) 20 MG tablet Take 20 mg by mouth daily.      Marland Kitchen levothyroxine (SYNTHROID, LEVOTHROID) 88 MCG tablet Take 100 mcg by mouth Daily.       Marland Kitchen omeprazole (PRILOSEC) 20 MG capsule Take 20 mg by mouth daily.      Marland Kitchen oxyCODONE-acetaminophen (PERCOCET/ROXICET) 5-325 MG per tablet Take 1 tablet by mouth every 8 (eight) hours as needed.       Marland Kitchen rOPINIRole (REQUIP) 0.5 MG tablet Take 1 mg by mouth at bedtime.       Marland Kitchen warfarin (COUMADIN) 5 MG  tablet Take 1 tablet daily except 2 tablets on Tuesdays, Thursdays and Saturdays or as directed  45 tablet  6  . zolpidem (AMBIEN) 10 MG tablet Take 10 mg by mouth at bedtime as needed.      . promethazine (PHENERGAN) 25 MG tablet Take 1 tablet (25 mg total) by mouth every 4 (four) hours as needed for nausea.  20 tablet  0   No current facility-administered medications for this visit.    Allergies as of 03/12/2013  . (No Known Allergies)    Family History  Problem Relation Age of Onset  . Colon cancer Neg Hx     History   Social History  . Marital Status: Married    Spouse Name: N/A    Number of Children: 2  . Years of Education: N/A   Occupational History  . Not on file.   Social History Main Topics  . Smoking status: Former Smoker -- 1.00 packs/day for 15 years     Types: Cigarettes    Quit date: 02/19/2006  . Smokeless tobacco: Never Used  . Alcohol Use: Yes     Comment: Occasional  . Drug Use: No  . Sexual Activity: No   Other Topics Concern  . Not on file   Social History Narrative  . No narrative on file    Review of Systems: As mentioned in HPI.   Physical Exam: BP 102/67  Pulse 65  Temp(Src) 97.6 F (36.4 C) (Oral)  Wt 182 lb 9.6 oz (82.827 kg) General:   Alert and oriented. Well-developed, well-nourished, pleasant and cooperative. Head:  Normocephalic and atraumatic. Eyes:  Conjunctiva pink, sclera clear, no icterus.   Conjunctiva pink. Ears:  Normal auditory acuity. Nose:  No deformity, discharge,  or lesions. Mouth:  No deformity or lesions, mucosa pink and moist.  Neck:  Supple, without mass or thyromegaly. Lungs:  Clear to auscultation bilaterally, without wheezing, rales, or rhonchi.  Heart:  S1, S2 present with prosthetic sound consistent with mechanical valve Abdomen:  +BS, soft, mild TTP in LUQ and epigastric region and non-distended. Without mass or HSM. No rebound or guarding. No hernias noted. Rectal:  Deferred  Msk:  Symmetrical without gross deformities. Normal posture. Extremities:  Without clubbing or edema. Neurologic:  Alert and  oriented x4;  grossly normal neurologically. Skin:  Intact, warm and dry without significant lesions or rashes Cervical Nodes:  No significant cervical adenopathy. Psych:  Alert and cooperative. Normal mood and affect.

## 2013-03-12 NOTE — Telephone Encounter (Signed)
Routing to Anna Sams, NP and Leigh Ann.  

## 2013-03-12 NOTE — Telephone Encounter (Signed)
Pt has appt with me on 1/28.  I will arrange Lovenox bridge at that time.  [name]

## 2013-03-12 NOTE — Telephone Encounter (Signed)
[name], this pt was seen by Laban Emperor, NP today in our office for dyspepsia and has been scheduled for EGD with Dr. Gala Romney on 03/25/2013. Please advise on holding coumadin x 5 days prior to procedure and the Lovenox Bridge. Thanks!

## 2013-03-12 NOTE — Assessment & Plan Note (Addendum)
43 year old female with several month history of epigastric discomfort with eating, nausea, and bloating. No dysphagia, melena, or rectal bleeding. Chronic postprandial bowel movements not associated with current presentation. No improvement with Prilosec daily. Gallbladder absent. Needs EGD in near future to further sort out clinical picture. Will order celiac serologies as well.   Proceed with upper endoscopy in the near future with Dr. Gala Romney. The risks, benefits, and alternatives have been discussed in detail with patient. They have stated understanding and desire to proceed.  TTg, IgA and IgA Continue Prilosec On COUMADIN DUE TO MECHANICAL HEART VALVE. Will ask cardiology to assist with lovenox bridging. Hold Coumadin for 5 days prior. I have sent a message to Edrick Oh, RN for further assistance.

## 2013-03-12 NOTE — Patient Instructions (Signed)
Please have blood work done today. We will call with results.   We have set you up for an upper endoscopy with Dr. Gala Romney in the near future. We will contact Edrick Oh, RN, to discuss the best way to proceed with holding Coumadin and doing Lovenox bridging.  Start taking Bentyl one capsule with meals to help with loose stool.   Further recommendations to follow!

## 2013-03-13 LAB — IGA: IGA: 443 mg/dL — AB (ref 69–380)

## 2013-03-13 LAB — TISSUE TRANSGLUTAMINASE, IGA: Tissue Transglutaminase Ab, IgA: 5.4 U/mL (ref <20)

## 2013-03-13 NOTE — Telephone Encounter (Signed)
Noted  

## 2013-03-16 NOTE — Telephone Encounter (Signed)
Thank you :)

## 2013-03-16 NOTE — Progress Notes (Signed)
cc'd to pcp 

## 2013-03-18 ENCOUNTER — Ambulatory Visit (INDEPENDENT_AMBULATORY_CARE_PROVIDER_SITE_OTHER): Payer: BC Managed Care – PPO | Admitting: *Deleted

## 2013-03-18 DIAGNOSIS — Z954 Presence of other heart-valve replacement: Secondary | ICD-10-CM

## 2013-03-18 DIAGNOSIS — I059 Rheumatic mitral valve disease, unspecified: Secondary | ICD-10-CM

## 2013-03-18 DIAGNOSIS — Z7901 Long term (current) use of anticoagulants: Secondary | ICD-10-CM

## 2013-03-18 DIAGNOSIS — Z1212 Encounter for screening for malignant neoplasm of rectum: Secondary | ICD-10-CM

## 2013-03-18 DIAGNOSIS — Z5181 Encounter for therapeutic drug level monitoring: Secondary | ICD-10-CM | POA: Diagnosis not present

## 2013-03-18 DIAGNOSIS — Z1211 Encounter for screening for malignant neoplasm of colon: Secondary | ICD-10-CM | POA: Insufficient documentation

## 2013-03-18 LAB — POCT INR: INR: 3.1

## 2013-03-18 MED ORDER — ENOXAPARIN SODIUM 80 MG/0.8ML ~~LOC~~ SOLN: 80.0000 mg | Freq: Two times a day (BID) | SUBCUTANEOUS | Status: DC

## 2013-03-18 NOTE — Progress Notes (Signed)
Quick Note:  Negative TTg, IgA. Serum IgA mildly elevated, non-specific.  Continue as planned with procedure. ______

## 2013-03-18 NOTE — Patient Instructions (Signed)
1/29  Last dose of coumadin  1/30  No lovenox or coumadin 1/31  Lovenox 80mg  BID (8am & 8pm) 2/1   Lovenox 80mg  BID (8am & 8pm) 2/2   Lovenox 80mg  BID (8am & 8pm) 2/3  Lovenox 80mg  BID (8am & 8pm) 2/4  No Lovenox in am---endoscopy---Lovenox 80mg  pm & coumadin 10mg  pm 2/5  Lovenox 80mg  bid (8am & 8pm) and coumadin 10mg  pm 2/6  Lovenox 80mg  bid (8am & 8pm) and coumadin 10mg  pm 2/7  Lovenox 80mg  bid (8am & 8pm) and coumadin 10mg  pm 2/8  Lovenox 80mg  bid (8am & 8pm) and coumadin 10mg  pm 2/9  Lovenox 80mg  8am---INR check @ 8:40pm

## 2013-03-18 NOTE — Progress Notes (Signed)
Lovenox to be arranged per cardiology. Confirmed with Edrick Oh, RN.

## 2013-03-25 ENCOUNTER — Encounter (HOSPITAL_COMMUNITY)
Admission: RE | Disposition: A | Discharge status: Home / Self Care | Payer: Self-pay | Source: Ambulatory Visit | Attending: Internal Medicine

## 2013-03-25 ENCOUNTER — Encounter (HOSPITAL_COMMUNITY): Payer: Self-pay | Admitting: *Deleted

## 2013-03-25 ENCOUNTER — Ambulatory Visit (HOSPITAL_COMMUNITY)
Admission: RE | Admit: 2013-03-25 | Discharge: 2013-03-25 | Disposition: A | Discharge status: Home / Self Care | Payer: BC Managed Care – PPO | Source: Ambulatory Visit | Attending: Internal Medicine | Admitting: Internal Medicine

## 2013-03-25 DIAGNOSIS — R635 Abnormal weight gain: Secondary | ICD-10-CM | POA: Insufficient documentation

## 2013-03-25 DIAGNOSIS — Z7901 Long term (current) use of anticoagulants: Secondary | ICD-10-CM | POA: Diagnosis not present

## 2013-03-25 DIAGNOSIS — R1013 Epigastric pain: Secondary | ICD-10-CM | POA: Insufficient documentation

## 2013-03-25 DIAGNOSIS — K2100 Gastro-esophageal reflux disease with esophagitis, without bleeding: Secondary | ICD-10-CM

## 2013-03-25 DIAGNOSIS — R195 Other fecal abnormalities: Secondary | ICD-10-CM

## 2013-03-25 HISTORY — PX: ESOPHAGOGASTRODUODENOSCOPY: SHX5428

## 2013-03-25 SURGERY — EGD (ESOPHAGOGASTRODUODENOSCOPY)
Anesthesia: Moderate Sedation

## 2013-03-25 MED ORDER — MIDAZOLAM HCL 5 MG/5ML IJ SOLN
INTRAMUSCULAR | Status: AC
  Filled 2013-03-25: qty 10

## 2013-03-25 MED ORDER — ONDANSETRON HCL 4 MG/2ML IJ SOLN
INTRAMUSCULAR | Status: DC | PRN
  Administered 2013-03-25: 4 mg via INTRAVENOUS

## 2013-03-25 MED ORDER — STERILE WATER FOR IRRIGATION IR SOLN
Status: DC | PRN
  Administered 2013-03-25: 10:00:00

## 2013-03-25 MED ORDER — LIDOCAINE VISCOUS 2 % MT SOLN
OROMUCOSAL | Status: DC | PRN
  Administered 2013-03-25: 3 mL via OROMUCOSAL

## 2013-03-25 MED ORDER — SODIUM CHLORIDE 0.9 % IV SOLN
INTRAVENOUS | Status: DC
  Administered 2013-03-25: 09:00:00 via INTRAVENOUS

## 2013-03-25 MED ORDER — LIDOCAINE VISCOUS 2 % MT SOLN
OROMUCOSAL | Status: AC
  Filled 2013-03-25: qty 15

## 2013-03-25 MED ORDER — ONDANSETRON HCL 4 MG/2ML IJ SOLN
INTRAMUSCULAR | Status: AC
  Filled 2013-03-25: qty 2

## 2013-03-25 MED ORDER — PROMETHAZINE HCL 25 MG/ML IJ SOLN
12.5000 mg | Freq: Four times a day (QID) | INTRAMUSCULAR | Status: DC | PRN
  Administered 2013-03-25: 12.5 mg via INTRAVENOUS

## 2013-03-25 MED ORDER — SODIUM CHLORIDE 0.9 % IJ SOLN
INTRAMUSCULAR | Status: AC
  Filled 2013-03-25: qty 10

## 2013-03-25 MED ORDER — MEPERIDINE HCL 100 MG/ML IJ SOLN
INTRAMUSCULAR | Status: DC | PRN
  Administered 2013-03-25: 50 mg via INTRAVENOUS
  Administered 2013-03-25: 25 mg via INTRAVENOUS

## 2013-03-25 MED ORDER — MEPERIDINE HCL 100 MG/ML IJ SOLN
INTRAMUSCULAR | Status: AC
  Filled 2013-03-25: qty 2

## 2013-03-25 MED ORDER — MIDAZOLAM HCL 5 MG/5ML IJ SOLN
INTRAMUSCULAR | Status: DC | PRN
  Administered 2013-03-25: 2 mg via INTRAVENOUS
  Administered 2013-03-25: 1 mg via INTRAVENOUS
  Administered 2013-03-25: 2 mg via INTRAVENOUS

## 2013-03-25 MED ORDER — PROMETHAZINE HCL 25 MG/ML IJ SOLN
INTRAMUSCULAR | Status: AC
  Filled 2013-03-25: qty 1

## 2013-03-25 NOTE — Discharge Instructions (Signed)
EGD Discharge instructions Please read the instructions outlined below and refer to this sheet in the next few weeks. These discharge instructions provide you with general information on caring for yourself after you leave the hospital. Your doctor may also give you specific instructions. While your treatment has been planned according to the most current medical practices available, unavoidable complications occasionally occur. If you have any problems or questions after discharge, please call your doctor. ACTIVITY  You may resume your regular activity but move at a slower pace for the next 24 hours.   Take frequent rest periods for the next 24 hours.   Walking will help expel (get rid of) the air and reduce the bloated feeling in your abdomen.   No driving for 24 hours (because of the anesthesia (medicine) used during the test).   You may shower.   Do not sign any important legal documents or operate any machinery for 24 hours (because of the anesthesia used during the test).  NUTRITION  Drink plenty of fluids.   You may resume your normal diet.   Begin with a light meal and progress to your normal diet.   Avoid alcoholic beverages for 24 hours or as instructed by your caregiver.  MEDICATIONS  You may resume your normal medications unless your caregiver tells you otherwise.  WHAT YOU CAN EXPECT TODAY  You may experience abdominal discomfort such as a feeling of fullness or gas pains.  FOLLOW-UP  Your doctor will discuss the results of your test with you.  SEEK IMMEDIATE MEDICAL ATTENTION IF ANY OF THE FOLLOWING OCCUR:  Excessive nausea (feeling sick to your stomach) and/or vomiting.   Severe abdominal pain and distention (swelling).   Trouble swallowing.   Temperature over 101 F (37.8 C).   Rectal bleeding or vomiting of blood.   Loose 30 pounds in the next 12 months  GERD information provided  Stop Prilosec; begin Dexilant 60 mg daily-go by my office for free  samples  Resume Coumadin today; resume Lovenox today and continue until INR therapeutic per cardiology  Office visit with [place] in 3 months

## 2013-03-25 NOTE — H&P (View-Only) (Signed)
    Referring Provider: Golding, John C, MD Primary Care Physician:  GOLDING, JOHN CABOT, MD Primary Gastroenterologist:  Dr. Rourk   Chief Complaint  Patient presents with  . Abdominal Pain    HPI:   Dedria T Bouwman presents today at the request of Dr. Golding secondary to abdominal pain. Symptoms present for several months. Stomach swells just with small bites of food. Occurs every time eating. Notes epigastric discomfort, tightness, pain. Only associated with eating. Intermittent nausea, no vomiting. Sometimes nausea exacerbated by eating. No dysphagia. No melena. No bright red blood per rectum. Leans more towards diarrhea than constipation but goes to the bathroom "regular". Postprandial bowel movements. Chronic. Prilosec daily. Chronic.  History of breast cancer. No prior EGD. No prior colonoscopy.   Past Medical History  Diagnosis Date  . SLE (systemic lupus erythematosus)   . BRCA2 negative   . BRCA1 negative   . 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 B12 deficiency   . Mitral valve disease, rheumatic     St. Jude prosthesis  . Raynaud disease   . Fibromyalgia   . Anxiety   . Peripheral neuropathy   . PONV (postoperative nausea and vomiting)   . Anticoagulation goal of INR 2.5 to 3.5   . Breast cancer 2011    Left side    Past Surgical History  Procedure Laterality Date  . Mitral valve replacement    . Cholecystectomy    . Mastectomy    . Cardiac valve replacement    . Breast surgery    . Endometrial ablation  2008    She no longer has menses  . Insertion expander left breast  11/16/10  . Tissue expander placement  02/14/2011    Procedure: TISSUE EXPANDER;  Surgeon: David M Bowers;  Location: [facility];  Service: Plastics;  Laterality: Bilateral;  Left Breast Remove Tissue Expander Placement of Implant Breast Reconstruction  . Mastopexy  02/14/2011    Procedure: MASTOPEXY;  Surgeon: David M Bowers;  Location: [facility];  Service: Plastics;   Laterality: Bilateral;  Right Breast Reduction     Current Outpatient Prescriptions  Medication Sig Dispense Refill  . cyanocobalamin (,VITAMIN B-12,) 1000 MCG/ML injection Inject 1 mL (1,000 mcg total) into the muscle every 30 (thirty) days.  10 mL  prn  . cyclobenzaprine (FLEXERIL) 10 MG tablet 10 mg as needed.       . furosemide (LASIX) 40 MG tablet Take 1 tablet (40 mg total) by mouth daily.  90 tablet  3  . gabapentin (NEURONTIN) 600 MG tablet Take 600 mg by mouth 2 (two) times daily.       . hydroxychloroquine (PLAQUENIL) 200 MG tablet Take 200 mg by mouth 2 (two) times daily.        . KLOR-CON M20 20 MEQ tablet TAKE ONE TABLET BY MOUTH EVERY DAY  30 tablet  0  . leflunomide (ARAVA) 20 MG tablet Take 20 mg by mouth daily.      . levothyroxine (SYNTHROID, LEVOTHROID) 88 MCG tablet Take 100 mcg by mouth Daily.       . omeprazole (PRILOSEC) 20 MG capsule Take 20 mg by mouth daily.      . oxyCODONE-acetaminophen (PERCOCET/ROXICET) 5-325 MG per tablet Take 1 tablet by mouth every 8 (eight) hours as needed.       . rOPINIRole (REQUIP) 0.5 MG tablet Take 1 mg by mouth at bedtime.       . warfarin (COUMADIN) 5 MG   tablet Take 1 tablet daily except 2 tablets on Tuesdays, Thursdays and Saturdays or as directed  45 tablet  6  . zolpidem (AMBIEN) 10 MG tablet Take 10 mg by mouth at bedtime as needed.      . promethazine (PHENERGAN) 25 MG tablet Take 1 tablet (25 mg total) by mouth every 4 (four) hours as needed for nausea.  20 tablet  0   No current facility-administered medications for this visit.    Allergies as of 03/12/2013  . (No Known Allergies)    Family History  Problem Relation Age of Onset  . Colon cancer Neg Hx     History   Social History  . Marital Status: Married    Spouse Name: N/A    Number of Children: 2  . Years of Education: N/A   Occupational History  . Not on file.   Social History Main Topics  . Smoking status: Former Smoker -- 1.00 packs/day for 15 years     Types: Cigarettes    Quit date: 02/19/2006  . Smokeless tobacco: Never Used  . Alcohol Use: Yes     Comment: Occasional  . Drug Use: No  . Sexual Activity: No   Other Topics Concern  . Not on file   Social History Narrative  . No narrative on file    Review of Systems: As mentioned in HPI.   Physical Exam: BP 102/67  Pulse 65  Temp(Src) 97.6 F (36.4 C) (Oral)  Wt 182 lb 9.6 oz (82.827 kg) General:   Alert and oriented. Well-developed, well-nourished, pleasant and cooperative. Head:  Normocephalic and atraumatic. Eyes:  Conjunctiva pink, sclera clear, no icterus.   Conjunctiva pink. Ears:  Normal auditory acuity. Nose:  No deformity, discharge,  or lesions. Mouth:  No deformity or lesions, mucosa pink and moist.  Neck:  Supple, without mass or thyromegaly. Lungs:  Clear to auscultation bilaterally, without wheezing, rales, or rhonchi.  Heart:  S1, S2 present with prosthetic sound consistent with mechanical valve Abdomen:  +BS, soft, mild TTP in LUQ and epigastric region and non-distended. Without mass or HSM. No rebound or guarding. No hernias noted. Rectal:  Deferred  Msk:  Symmetrical without gross deformities. Normal posture. Extremities:  Without clubbing or edema. Neurologic:  Alert and  oriented x4;  grossly normal neurologically. Skin:  Intact, warm and dry without significant lesions or rashes Cervical Nodes:  No significant cervical adenopathy. Psych:  Alert and cooperative. Normal mood and affect.   

## 2013-03-25 NOTE — Op Note (Addendum)
[facility] [address] [place], [address]   ENDOSCOPY PROCEDURE REPORT  PATIENT: [name], [name]  MR#: [id number] BIRTHDATE: 11/11/1970 , 28  yrs. old GENDER: Female ENDOSCOPIST: R.  Garfield Cornea, MD FACP [facility] REFERRED BY:  Sinda Du, M.D.  Yehuda Savannah, M.D. PROCEDURE DATE:  03/25/2013 PROCEDURE:     Diagnostic EGD  INDICATIONS:     epigastric pain in the setting of a 30 pound weight gain recently. Omeprazole no longer works for reflux  INFORMED CONSENT:   The risks, benefits, limitations, alternatives and imponderables have been discussed.  The potential for biopsy, esophogeal dilation, etc. have also been reviewed.  Questions have been answered.  All parties agreeable.  Please see the history and physical in the medical record for more information.  MEDICATIONS:   Versed 5 mg IV and Demerol 75 mg IV in divided doses. Zofran 4 mg IV. Xylocaine gel 2%  DESCRIPTION OF PROCEDURE:   The [id number] ([id number])  endoscope was introduced through the mouth and advanced to the second portion of the duodenum without difficulty or limitations.  The mucosal surfaces were surveyed very carefully during advancement of the scope and upon withdrawal.  Retroflexion view of the proximal stomach and esophagogastric junction was performed.      FINDINGS: Patulous EG junction with linear and circumferential erosions straddling the EG junction .  Longest linear erosion 1.5 cm in length. No Barrett's esophagus. Stomach empty. 3 cm hiatal hernia. Gastric mucosa normal. Pylorus patent. Normal first and second portion of the duodenum  THERAPEUTIC / DIAGNOSTIC MANEUVERS PERFORMED:  None   COMPLICATIONS:  None  IMPRESSION:  Erosive reflux esophagitis-likely source of patient's symptoms (patulous EG junction). Hiatal hernia; Otherwise, normal EGD  RECOMMENDATIONS:  Resume Coumadin and Lovenox today. Continue Lovenox until INR therapeutic per cardiology. Stop  Prilosec; begin Dexilant 60 mg daily. Go by my office for free samples. Literature on GERD provided. Discussed the multipronged approach to reflux. It would be in patient's best interest to lose the 30 pounds she has recently gained. I recommend a slow steady weight loss i.e. losing weight over the next year.  Office visit with [place] in 3 months    _______________________________ R. Garfield Cornea, MD FACP [facility] eSigned:  R. Garfield Cornea, MD FACP [facility] 03/25/2013 10:26 AM Revised: 03/25/2013 10:26 AM    CC:  PATIENT NAME:  [name], [name] MR#: [id number]

## 2013-03-25 NOTE — Interval H&P Note (Signed)
History and Physical Interval Note:  03/25/2013 9:47 AM  [name]  has presented today for surgery, with the diagnosis of DYSPEPSIA AND LOOSE STOOLS  The various methods of treatment have been discussed with the patient and family. After consideration of risks, benefits and other options for treatment, the patient has consented to  Procedure(s) with comments: ESOPHAGOGASTRODUODENOSCOPY (EGD) (N/A) - 930 as a surgical intervention .  The patient's history has been reviewed, patient examined, no change in status, stable for surgery.  I have reviewed the patient's chart and labs.  Questions were answered to the patient's satisfaction.     No change. EGD per plan. Celiac screen negative.The risks, benefits, limitations, alternatives and imponderables have been reviewed with the patient. Questions have been answered. All parties are agreeable.    Manus Rudd

## 2013-03-25 NOTE — Op Note (Signed)
Patient complaint of Nausea Dr. Gala Romney notified 12.5 phenergan ordered and given Nausea resolved, patient discharged without complaint

## 2013-03-30 ENCOUNTER — Ambulatory Visit (INDEPENDENT_AMBULATORY_CARE_PROVIDER_SITE_OTHER): Payer: BC Managed Care – PPO | Admitting: *Deleted

## 2013-03-30 ENCOUNTER — Encounter (HOSPITAL_COMMUNITY): Payer: Self-pay | Admitting: Internal Medicine

## 2013-03-30 DIAGNOSIS — Z5181 Encounter for therapeutic drug level monitoring: Secondary | ICD-10-CM

## 2013-03-30 DIAGNOSIS — Z7901 Long term (current) use of anticoagulants: Secondary | ICD-10-CM

## 2013-03-30 DIAGNOSIS — I059 Rheumatic mitral valve disease, unspecified: Secondary | ICD-10-CM

## 2013-03-30 DIAGNOSIS — Z954 Presence of other heart-valve replacement: Secondary | ICD-10-CM

## 2013-03-30 LAB — POCT INR: INR: 2.7

## 2013-03-31 DIAGNOSIS — K2100 Gastro-esophageal reflux disease with esophagitis, without bleeding: Secondary | ICD-10-CM | POA: Diagnosis not present

## 2013-03-31 DIAGNOSIS — M329 Systemic lupus erythematosus, unspecified: Secondary | ICD-10-CM | POA: Diagnosis not present

## 2013-03-31 DIAGNOSIS — E669 Obesity, unspecified: Secondary | ICD-10-CM | POA: Diagnosis not present

## 2013-03-31 DIAGNOSIS — E876 Hypokalemia: Secondary | ICD-10-CM | POA: Diagnosis not present

## 2013-04-08 ENCOUNTER — Other Ambulatory Visit: Payer: Self-pay | Admitting: Cardiology

## 2013-04-10 DIAGNOSIS — E876 Hypokalemia: Secondary | ICD-10-CM | POA: Diagnosis not present

## 2013-04-10 DIAGNOSIS — M329 Systemic lupus erythematosus, unspecified: Secondary | ICD-10-CM | POA: Diagnosis not present

## 2013-04-10 DIAGNOSIS — D643 Other sideroblastic anemias: Secondary | ICD-10-CM | POA: Diagnosis not present

## 2013-04-10 DIAGNOSIS — Z6829 Body mass index (BMI) 29.0-29.9, adult: Secondary | ICD-10-CM | POA: Diagnosis not present

## 2013-04-13 ENCOUNTER — Telehealth: Payer: Self-pay | Admitting: Internal Medicine

## 2013-04-13 ENCOUNTER — Ambulatory Visit (INDEPENDENT_AMBULATORY_CARE_PROVIDER_SITE_OTHER): Payer: BC Managed Care – PPO | Admitting: *Deleted

## 2013-04-13 DIAGNOSIS — Z7901 Long term (current) use of anticoagulants: Secondary | ICD-10-CM

## 2013-04-13 DIAGNOSIS — Z954 Presence of other heart-valve replacement: Secondary | ICD-10-CM | POA: Diagnosis not present

## 2013-04-13 DIAGNOSIS — I059 Rheumatic mitral valve disease, unspecified: Secondary | ICD-10-CM | POA: Diagnosis not present

## 2013-04-13 DIAGNOSIS — Z5181 Encounter for therapeutic drug level monitoring: Secondary | ICD-10-CM

## 2013-04-13 LAB — POCT INR: INR: 3.2

## 2013-04-13 MED ORDER — DEXLANSOPRAZOLE 60 MG PO CPDR: 60.0000 mg | DELAYED_RELEASE_CAPSULE | Freq: Every day | ORAL | Status: DC

## 2013-04-13 NOTE — Telephone Encounter (Signed)
Pt had procedure done a few weeks ago by [facility] and tried Dexilant samples. They are helping her some and would like for [place] to call in a prescription to [facility] in [place].

## 2013-04-13 NOTE — Telephone Encounter (Signed)
Done

## 2013-04-13 NOTE — Telephone Encounter (Signed)
Routing to refill box  

## 2013-04-22 ENCOUNTER — Encounter: Payer: Self-pay | Admitting: Cardiology

## 2013-04-22 ENCOUNTER — Ambulatory Visit (INDEPENDENT_AMBULATORY_CARE_PROVIDER_SITE_OTHER): Payer: BC Managed Care – PPO | Admitting: Cardiology

## 2013-04-22 VITALS — BP 110/80 | HR 74 | Ht 64.0 in | Wt 171.0 lb

## 2013-04-22 DIAGNOSIS — R0609 Other forms of dyspnea: Secondary | ICD-10-CM | POA: Diagnosis not present

## 2013-04-22 DIAGNOSIS — R0989 Other specified symptoms and signs involving the circulatory and respiratory systems: Secondary | ICD-10-CM

## 2013-04-22 DIAGNOSIS — I059 Rheumatic mitral valve disease, unspecified: Secondary | ICD-10-CM | POA: Diagnosis not present

## 2013-04-22 DIAGNOSIS — R0600 Dyspnea, unspecified: Secondary | ICD-10-CM

## 2013-04-22 NOTE — Patient Instructions (Signed)
Continue all current medications. Your physician wants you to follow up in: 6 months.  You will receive a reminder letter in the mail one-two months in advance.  If you don't receive a letter, please call our office to schedule the follow up appointment  Echo - just prior to next office visit 

## 2013-04-22 NOTE — Progress Notes (Signed)
Clinical Summary [name] is a 43 y.o.female last seen in December 2014. She is here with her daughter. States she has done reasonably well in terms of shortness of breath. In the arm has seen GI and states that she has an esophageal ulceration with evidence of reflux by EGD. Now starting in the excellent. Wonder whether she was having any acid aspiration leading to some shortness of breath as well.  PFTs demonstrated a moderate ventilatory defect with air flow obstruction, mildly reduced total lung capacity, severely reduced DLCO. Recent echocardiogram demonstrated mild LVH with LVEF 93-79%, diastolic dysfunction with increased filling pressures, stable St. Jude mitral prosthesis with mean gradient 4 mm mercury and no paravalvular leak, mild left atrial enlargement. CT of the chest from December 2014 demonstrated no significant pulmonary findings or masses.  She is being considered for Rituxan treatments. No history of angina or cardiac arrhythmias. She has a history of St. Jude mitral valve replacement as noted, LVEF documented at 50-55% at baseline.  No Known Allergies  Current Outpatient Prescriptions  Medication Sig Dispense Refill  . cyanocobalamin (,VITAMIN B-12,) 1000 MCG/ML injection Inject 1 mL (1,000 mcg total) into the muscle every 30 (thirty) days.  10 mL  prn  . dexlansoprazole (DEXILANT) 60 MG capsule Take 1 capsule (60 mg total) by mouth daily.  31 capsule  11  . furosemide (LASIX) 40 MG tablet Take 1 tablet (40 mg total) by mouth daily.  90 tablet  3  . gabapentin (NEURONTIN) 600 MG tablet Take 600 mg by mouth 2 (two) times daily.       . hydroxychloroquine (PLAQUENIL) 200 MG tablet Take 200 mg by mouth 2 (two) times daily.        Marland Kitchen ibuprofen (ADVIL,MOTRIN) 200 MG tablet Take 800 mg by mouth every 6 (six) hours as needed for headache.      Marland Kitchen KLOR-CON M20 20 MEQ tablet TAKE ONE TABLET BY MOUTH EVERY DAY  30 tablet  0  . oxyCODONE-acetaminophen (PERCOCET/ROXICET) 5-325 MG per  tablet Take 1 tablet by mouth every 8 (eight) hours as needed for moderate pain.       . promethazine (PHENERGAN) 25 MG tablet Take 25 mg by mouth every 6 (six) hours as needed for nausea or vomiting.      Marland Kitchen rOPINIRole (REQUIP) 0.5 MG tablet Take 1 mg by mouth at bedtime.       Marland Kitchen warfarin (COUMADIN) 5 MG tablet TAKE ONE TABLET BY MOUTH DAILY EXCEPT TWO TABLETS ON TUESDAYS, THURSDAYS, AND SATURDAYS OR AS DIRECTED.  45 tablet  3  . zolpidem (AMBIEN) 10 MG tablet Take 10 mg by mouth at bedtime as needed for sleep.        No current facility-administered medications for this visit.    Past Medical History  Diagnosis Date  . SLE (systemic lupus erythematosus)   . BRCA2 negative   . BRCA1 negative   . 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 B12 deficiency   . Mitral valve disease, rheumatic     St. Jude prosthesis  . Raynaud disease   . Fibromyalgia   . Anxiety   . Peripheral neuropathy   . PONV (postoperative nausea and vomiting)   . Anticoagulation goal of INR 2.5 to 3.5   . Breast cancer 2011    Left side    Past Surgical History  Procedure Laterality Date  . Mitral valve replacement    . Cholecystectomy    . Mastectomy    .  Cardiac valve replacement    . Breast surgery    . Endometrial ablation  2008    She no longer has menses  . Insertion expander left breast  11/16/10  . Tissue expander placement  02/14/2011    Procedure: TISSUE EXPANDER;  Surgeon: Macon Large;  Location: [facility];  Service: Plastics;  Laterality: Bilateral;  Left Breast Remove Tissue Expander Placement of Implant Breast Reconstruction  . Mastopexy  02/14/2011    Procedure: MASTOPEXY;  Surgeon: Macon Large;  Location: [facility];  Service: Plastics;  Laterality: Bilateral;  Right Breast Reduction   . Esophagogastroduodenoscopy N/A 03/25/2013    Procedure: ESOPHAGOGASTRODUODENOSCOPY (EGD);  Surgeon: Daneil Dolin, MD;  Location[facility];  Service: Endoscopy;  Laterality: N/A;   69    Social History [name] reports that she quit smoking about 7 years ago. Her smoking use included Cigarettes. She has a 15 pack-year smoking history. She has never used smokeless tobacco. [name] reports that she drinks alcohol.  Review of Systems No chest pain or palpitations. Stable NYHA class II dyspnea. Reflux symptoms. Otherwise negative.  Physical Examination Filed Vitals:   04/22/13 1304  BP: 110/80  Pulse: 74   Filed Weights   04/22/13 1304  Weight: 171 lb (77.565 kg)    Appears comfortable at rest.  HEENT: Conjunctiva and lids normal, oropharynx clear.  Neck: Supple, no elevated JVP or carotid bruits, no thyromegaly.  Lungs: Clear to auscultation, nonlabored breathing at rest.  Cardiac: Regular rate and rhythm, crisp prosthetic sound in S1, no S3 or significant systolic murmur, no pericardial rub.  Abdomen: Soft, nontender, bowel sounds present, no guarding or rebound.  Extremities: No pitting edema, distal pulses 2+.  Skin: Warm and dry.  Musculoskeletal: No kyphosis.  Neuropsychiatric: Alert and oriented x3, affect grossly appropriate.   Problem List and Plan   Mitral valve disorders History of rheumatic mitral valve disease status post St. Jude mechanical replacement. Stable by recent echocardiogram. She continues on Coumadin. LVEF 50-55% by echocardiogram in September 2014. We will repeat a followup study for next visit, particularly if she undergoes treatment with Rituxan.  Dyspnea Improved per patient report. Question whether she was having any aspiration with acid reflux leading to shortness of breath.    [name], M.D., F.A.C.C.

## 2013-04-22 NOTE — Assessment & Plan Note (Signed)
History of rheumatic mitral valve disease status post St. Jude mechanical replacement. Stable by recent echocardiogram. She continues on Coumadin. LVEF 50-55% by echocardiogram in September 2014. We will repeat a followup study for next visit, particularly if she undergoes treatment with Rituxan.

## 2013-04-22 NOTE — Assessment & Plan Note (Signed)
Improved per patient report. Question whether she was having any aspiration with acid reflux leading to shortness of breath.

## 2013-05-04 ENCOUNTER — Ambulatory Visit (INDEPENDENT_AMBULATORY_CARE_PROVIDER_SITE_OTHER): Payer: BC Managed Care – PPO | Admitting: *Deleted

## 2013-05-04 DIAGNOSIS — Z5181 Encounter for therapeutic drug level monitoring: Secondary | ICD-10-CM | POA: Diagnosis not present

## 2013-05-04 DIAGNOSIS — Z7901 Long term (current) use of anticoagulants: Secondary | ICD-10-CM | POA: Diagnosis not present

## 2013-05-04 DIAGNOSIS — Z954 Presence of other heart-valve replacement: Secondary | ICD-10-CM

## 2013-05-04 DIAGNOSIS — I059 Rheumatic mitral valve disease, unspecified: Secondary | ICD-10-CM | POA: Diagnosis not present

## 2013-05-04 LAB — POCT INR: INR: 2.7

## 2013-05-08 DIAGNOSIS — F411 Generalized anxiety disorder: Secondary | ICD-10-CM | POA: Diagnosis not present

## 2013-05-08 DIAGNOSIS — G8929 Other chronic pain: Secondary | ICD-10-CM | POA: Diagnosis not present

## 2013-05-08 DIAGNOSIS — E538 Deficiency of other specified B group vitamins: Secondary | ICD-10-CM | POA: Diagnosis not present

## 2013-05-08 DIAGNOSIS — Z6829 Body mass index (BMI) 29.0-29.9, adult: Secondary | ICD-10-CM | POA: Diagnosis not present

## 2013-05-14 ENCOUNTER — Encounter (HOSPITAL_COMMUNITY): Admission: RE | Admit: 2013-05-14 | Payer: BC Managed Care – PPO | Source: Ambulatory Visit

## 2013-05-14 ENCOUNTER — Other Ambulatory Visit (HOSPITAL_COMMUNITY): Payer: Self-pay | Admitting: Rheumatology

## 2013-05-25 ENCOUNTER — Encounter: Payer: Self-pay | Admitting: *Deleted

## 2013-05-26 ENCOUNTER — Encounter (HOSPITAL_COMMUNITY): Payer: Self-pay

## 2013-05-26 ENCOUNTER — Encounter (HOSPITAL_COMMUNITY)
Admission: RE | Admit: 2013-05-26 | Discharge: 2013-05-26 | Disposition: A | Discharge status: Home / Self Care | Payer: BC Managed Care – PPO | Source: Ambulatory Visit | Attending: Rheumatology | Admitting: Rheumatology

## 2013-05-26 DIAGNOSIS — M069 Rheumatoid arthritis, unspecified: Secondary | ICD-10-CM | POA: Diagnosis not present

## 2013-05-26 DIAGNOSIS — Z79899 Other long term (current) drug therapy: Secondary | ICD-10-CM | POA: Diagnosis not present

## 2013-05-26 MED ORDER — ACETAMINOPHEN 325 MG PO TABS
650.0000 mg | ORAL_TABLET | Freq: Once | ORAL | Status: AC
  Administered 2013-05-26: 650 mg via ORAL

## 2013-05-26 MED ORDER — METHYLPREDNISOLONE SODIUM SUCC 125 MG IJ SOLR
100.0000 mg | Freq: Every day | INTRAMUSCULAR | Status: DC
  Administered 2013-05-26: 100 mg via INTRAVENOUS
  Filled 2013-05-26: qty 2

## 2013-05-26 MED ORDER — ACETAMINOPHEN 325 MG PO TABS
ORAL_TABLET | ORAL | Status: AC
  Filled 2013-05-26: qty 2

## 2013-05-26 MED ORDER — SODIUM CHLORIDE 0.9 % IV SOLN
1000.0000 mg | Freq: Every day | INTRAVENOUS | Status: DC
  Administered 2013-05-26: 1000 mg via INTRAVENOUS
  Filled 2013-05-26: qty 100

## 2013-05-26 MED ORDER — LORATADINE 10 MG PO TABS
ORAL_TABLET | ORAL | Status: AC
  Filled 2013-05-26: qty 1

## 2013-05-26 MED ORDER — SODIUM CHLORIDE 0.9 % IV SOLN
Freq: Every day | INTRAVENOUS | Status: DC
  Administered 2013-05-26: 250 mL via INTRAVENOUS

## 2013-05-26 MED ORDER — ACETAMINOPHEN 325 MG PO TABS
650.0000 mg | ORAL_TABLET | Freq: Every day | ORAL | Status: DC
  Administered 2013-05-26: 650 mg via ORAL
  Filled 2013-05-26: qty 2

## 2013-05-26 MED ORDER — LORATADINE 10 MG PO TABS
10.0000 mg | ORAL_TABLET | Freq: Every day | ORAL | Status: DC
  Administered 2013-05-26: 10 mg via ORAL

## 2013-05-26 NOTE — Discharge Instructions (Signed)
Rheumatoid Arthritis Rheumatoid arthritis is a long-term (chronic) inflammatory disease that causes pain, swelling, and stiffness of the joints. It can affect the entire body, including the eyes and lungs. The effects of rheumatoid arthritis vary widely among those with the condition. CAUSES  The cause of rheumatoid arthritis is not known. It tends to run in families and is more common in women. Certain cells of the body's natural defense system (immune system) do not work properly and begin to attack healthy joints. It primarily involves the connective tissue that lines the joints (synovial membrane). This can cause damage to the joint. SYMPTOMS   Pain, stiffness, swelling, and decreased motion of many joints, especially in the hands and feet.  Stiffness that is worse in the morning. It may last 1 2 hours or longer.  Numbness and tingling in the hands.  Fatigue.  Loss of appetite.  Weight loss.  Low-grade fever.  Dry eyes and mouth.  Firm lumps (rheumatoid nodules) that grow beneath the skin in areas such as the elbows and hands. DIAGNOSIS  Diagnosis is based on the symptoms described, an exam, and blood tests. Sometimes, X-rays are helpful. TREATMENT  The goals of treatment are to relieve pain, reduce inflammation, and to slow down or stop joint damage and disability. Methods vary and may include:  Maintaining a balance of rest, exercise, and proper nutrition.  Medicines:  Pain relievers (analgesics).  Corticosteroids and nonsteroidal anti-inflammatory drugs (NSAIDs) to reduce inflammation.  Disease-modifying antirheumatic drugs (DMARDs) to try to slow the course of the disease.  Biologic response modifiers to reduce inflammation and damage.  Physical therapy and occupational therapy.  Surgery for patients with severe joint damage. Joint replacement or fusing of joints may be needed.  Routine monitoring and ongoing care, such as office visits, blood and urine tests, and  X-rays. HOME CARE INSTRUCTIONS   Remain physically active and reduce activity when the disease gets worse.  Eat a well-balanced diet.  Put heat on affected joints when you wake up and before activities. Keep the heat on the affected joint for as long as directed by your caregiver.  Put ice on affected joints following activities or exercising.  Put ice in a plastic bag.  Place a towel between your skin and the bag.  Leave the ice on for 15-20 minutes, 03-04 times a day.  Take all medicines and supplements as directed by your caregiver.  Use splints as directed by your caregiver. Splints help maintain joint position and function.  Do not sleep with pillows under your knees. This may lead to spasms.  Participate in a self-management program to keep current with the latest treatment and coping skills. SEEK IMMEDIATE MEDICAL CARE IF:  You have fainting episodes.  You have periods of extreme weakness.  You rapidly develop a hot, painful joint that is more severe than usual joint aches.  You have chills.  You have a fever. MAKE SURE YOU:  Understand these instructions.  Will watch your condition.  Will get help right away if you are not doing well or get worse. FOR MORE INFORMATION  American College of Rheumatology: www.rheumatology.[place]: www.arthritis.org Document Released: 02/03/2000 Document Revised: 08/07/2011 Document Reviewed: 03/14/2011 [facility] Patient Information 2014 [place], [place].   Rituximab injection What is this medicine? RITUXIMAB (ri TUX i mab) is a monoclonal antibody. This medicine changes the way the body's immune system works. It is used commonly to treat non-Hodgkin's lymphoma and other conditions. In cancer cells, this drug targets a specific protein  within cancer cells and stops the cancer cells from growing. It is also used to treat rhuematoid arthritis (RA). In RA, this medicine slow the inflammatory process and help reduce  joint pain and swelling. This medicine is often used with other cancer or arthritis medications. This medicine may be used for other purposes; ask your health care provider or pharmacist if you have questions. COMMON BRAND NAME(S): Rituxan What should I tell my health care provider before I take this medicine? They need to know if you have any of these conditions: -blood disorders -heart disease -history of hepatitis B -infection (especially a virus infection such as chickenpox, cold sores, or herpes) -irregular heartbeat -kidney disease -lung or breathing disease, like asthma -lupus -an unusual or allergic reaction to rituximab, mouse proteins, other medicines, foods, dyes, or preservatives -pregnant or trying to get pregnant -breast-feeding How should I use this medicine? This medicine is for infusion into a vein. It is administered in a hospital or clinic by a specially trained health care professional. A special MedGuide will be given to you by the pharmacist with each prescription and refill. Be sure to read this information carefully each time. Talk to your pediatrician regarding the use of this medicine in children. This medicine is not approved for use in children. Overdosage: If you think you have taken too much of this medicine contact a poison control center or emergency room at once. NOTE: This medicine is only for you. Do not share this medicine with others. What if I miss a dose? It is important not to miss a dose. Call your doctor or health care professional if you are unable to keep an appointment. What may interact with this medicine? -cisplatin -medicines for blood pressure -some other medicines for arthritis -vaccines This list may not describe all possible interactions. Give your health care provider a list of all the medicines, herbs, non-prescription drugs, or dietary supplements you use. Also tell them if you smoke, drink alcohol, or use illegal drugs. Some items  may interact with your medicine. What should I watch for while using this medicine? Report any side effects that you notice during your treatment right away, such as changes in your breathing, fever, chills, dizziness or lightheadedness. These effects are more common with the first dose. Visit your prescriber or health care professional for checks on your progress. You will need to have regular blood work. Report any other side effects. The side effects of this medicine can continue after you finish your treatment. Continue your course of treatment even though you feel ill unless your doctor tells you to stop. Call your doctor or health care professional for advice if you get a fever, chills or sore throat, or other symptoms of a cold or flu. Do not treat yourself. This drug decreases your body's ability to fight infections. Try to avoid being around people who are sick. This medicine may increase your risk to bruise or bleed. Call your doctor or health care professional if you notice any unusual bleeding. Be careful brushing and flossing your teeth or using a toothpick because you may get an infection or bleed more easily. If you have any dental work done, tell your dentist you are receiving this medicine. Avoid taking products that contain aspirin, acetaminophen, ibuprofen, naproxen, or ketoprofen unless instructed by your doctor. These medicines may hide a fever. Do not become pregnant while taking this medicine. Women should inform their doctor if they wish to become pregnant or think they might be pregnant.  There is a potential for serious side effects to an unborn child. Talk to your health care professional or pharmacist for more information. Do not breast-feed an infant while taking this medicine. What side effects may I notice from receiving this medicine? Side effects that you should report to your doctor or health care professional as soon as possible: -allergic reactions like skin rash,  itching or hives, swelling of the face, lips, or tongue -low blood counts - this medicine may decrease the number of white blood cells, red blood cells and platelets. You may be at increased risk for infections and bleeding. -signs of infection - fever or chills, cough, sore throat, pain or difficulty passing urine -signs of decreased platelets or bleeding - bruising, pinpoint red spots on the skin, black, tarry stools, blood in the urine -signs of decreased red blood cells - unusually weak or tired, fainting spells, lightheadedness -breathing problems -confused, not responsive -chest pain -fast, irregular heartbeat -feeling faint or lightheaded, falls -mouth sores -redness, blistering, peeling or loosening of the skin, including inside the mouth -stomach pain -swelling of the ankles, feet, or hands -trouble passing urine or change in the amount of urine Side effects that usually do not require medical attention (report to your doctor or other health care professional if they continue or are bothersome): -anxiety -headache -loss of appetite -muscle aches -nausea -night sweats This list may not describe all possible side effects. Call your doctor for medical advice about side effects. You may report side effects to FDA at 1-800-FDA-1088. Where should I keep my medicine? This drug is given in a hospital or clinic and will not be stored at home. NOTE: This sheet is a summary. It may not cover all possible information. If you have questions about this medicine, talk to your doctor, pharmacist, or health care provider.  2014, Elsevier/Gold Standard. (2007-10-06 14:04:59)

## 2013-05-26 NOTE — Progress Notes (Signed)
rituxan infused. IV line flushed with saline. Tolerated well. Voices no c/o at this time.

## 2013-05-26 NOTE — Progress Notes (Signed)
Talked to [name] at [facility] office. Pt status given with vital signs. States would contact Dr Trudie Reed and return a call to me.

## 2013-05-26 NOTE — Progress Notes (Signed)
[name] from Dr Trudie Reed office called. Instructed that the itching and sore throat was okay and not to be concerned. Okay to continue infusion. Infusion increased to 87.5 ml/hr. Tolerated well.

## 2013-05-26 NOTE — Progress Notes (Signed)
Awake. Talking. C/O sorethroat and itching. No resp distress. No difficulty breathing. VS 131/82, P 52, R 16, O2 sat 99%. rituxan remains at 200 mg/hr = 70 ml/hr. Talked to Ina Homes. Instructed to call pt's physicians.

## 2013-05-26 NOTE — Progress Notes (Addendum)
rituxan infusing via pump without difficulties. Voices no c/o at this time.

## 2013-05-26 NOTE — Progress Notes (Signed)
Awake. No adverse reactions d/c to home in good condition.

## 2013-05-28 ENCOUNTER — Ambulatory Visit (INDEPENDENT_AMBULATORY_CARE_PROVIDER_SITE_OTHER): Payer: BC Managed Care – PPO | Admitting: *Deleted

## 2013-05-28 ENCOUNTER — Encounter (HOSPITAL_COMMUNITY): Payer: BC Managed Care – PPO

## 2013-05-28 DIAGNOSIS — Z7901 Long term (current) use of anticoagulants: Secondary | ICD-10-CM

## 2013-05-28 DIAGNOSIS — Z5181 Encounter for therapeutic drug level monitoring: Secondary | ICD-10-CM | POA: Diagnosis not present

## 2013-05-28 DIAGNOSIS — Z954 Presence of other heart-valve replacement: Secondary | ICD-10-CM

## 2013-05-28 DIAGNOSIS — I059 Rheumatic mitral valve disease, unspecified: Secondary | ICD-10-CM

## 2013-05-28 LAB — POCT INR: INR: 5.7

## 2013-05-29 DIAGNOSIS — R0602 Shortness of breath: Secondary | ICD-10-CM | POA: Diagnosis not present

## 2013-05-29 DIAGNOSIS — I10 Essential (primary) hypertension: Secondary | ICD-10-CM | POA: Diagnosis not present

## 2013-05-29 DIAGNOSIS — L930 Discoid lupus erythematosus: Secondary | ICD-10-CM | POA: Diagnosis not present

## 2013-06-03 ENCOUNTER — Encounter (HOSPITAL_COMMUNITY): Payer: Self-pay | Admitting: Emergency Medicine

## 2013-06-03 ENCOUNTER — Inpatient Hospital Stay (HOSPITAL_COMMUNITY)
Admission: EM | Admit: 2013-06-03 | Discharge: 2013-06-08 | DRG: 558 | Disposition: A | Discharge status: Home / Self Care | Payer: BC Managed Care – PPO | Attending: Family Medicine | Admitting: Family Medicine

## 2013-06-03 DIAGNOSIS — Z79899 Other long term (current) drug therapy: Secondary | ICD-10-CM | POA: Diagnosis not present

## 2013-06-03 DIAGNOSIS — H409 Unspecified glaucoma: Secondary | ICD-10-CM | POA: Diagnosis present

## 2013-06-03 DIAGNOSIS — M329 Systemic lupus erythematosus, unspecified: Secondary | ICD-10-CM | POA: Diagnosis present

## 2013-06-03 DIAGNOSIS — D591 Other autoimmune hemolytic anemias: Secondary | ICD-10-CM

## 2013-06-03 DIAGNOSIS — I7300 Raynaud's syndrome without gangrene: Secondary | ICD-10-CM | POA: Diagnosis present

## 2013-06-03 DIAGNOSIS — R0600 Dyspnea, unspecified: Secondary | ICD-10-CM

## 2013-06-03 DIAGNOSIS — Z954 Presence of other heart-valve replacement: Secondary | ICD-10-CM | POA: Diagnosis not present

## 2013-06-03 DIAGNOSIS — Z9010 Acquired absence of unspecified breast and nipple: Secondary | ICD-10-CM | POA: Diagnosis not present

## 2013-06-03 DIAGNOSIS — L03119 Cellulitis of unspecified part of limb: Secondary | ICD-10-CM | POA: Diagnosis not present

## 2013-06-03 DIAGNOSIS — Z952 Presence of prosthetic heart valve: Secondary | ICD-10-CM

## 2013-06-03 DIAGNOSIS — A499 Bacterial infection, unspecified: Secondary | ICD-10-CM | POA: Diagnosis not present

## 2013-06-03 DIAGNOSIS — B9689 Other specified bacterial agents as the cause of diseases classified elsewhere: Secondary | ICD-10-CM | POA: Diagnosis present

## 2013-06-03 DIAGNOSIS — L02419 Cutaneous abscess of limb, unspecified: Secondary | ICD-10-CM | POA: Diagnosis not present

## 2013-06-03 DIAGNOSIS — G629 Polyneuropathy, unspecified: Secondary | ICD-10-CM

## 2013-06-03 DIAGNOSIS — K754 Autoimmune hepatitis: Secondary | ICD-10-CM | POA: Diagnosis present

## 2013-06-03 DIAGNOSIS — Z87891 Personal history of nicotine dependence: Secondary | ICD-10-CM

## 2013-06-03 DIAGNOSIS — F411 Generalized anxiety disorder: Secondary | ICD-10-CM | POA: Diagnosis present

## 2013-06-03 DIAGNOSIS — Z7901 Long term (current) use of anticoagulants: Secondary | ICD-10-CM

## 2013-06-03 DIAGNOSIS — I059 Rheumatic mitral valve disease, unspecified: Secondary | ICD-10-CM

## 2013-06-03 DIAGNOSIS — IMO0001 Reserved for inherently not codable concepts without codable children: Secondary | ICD-10-CM | POA: Diagnosis present

## 2013-06-03 DIAGNOSIS — R1013 Epigastric pain: Secondary | ICD-10-CM

## 2013-06-03 DIAGNOSIS — Z853 Personal history of malignant neoplasm of breast: Secondary | ICD-10-CM

## 2013-06-03 DIAGNOSIS — G609 Hereditary and idiopathic neuropathy, unspecified: Secondary | ICD-10-CM | POA: Diagnosis present

## 2013-06-03 DIAGNOSIS — Z6828 Body mass index (BMI) 28.0-28.9, adult: Secondary | ICD-10-CM | POA: Diagnosis not present

## 2013-06-03 DIAGNOSIS — R791 Abnormal coagulation profile: Secondary | ICD-10-CM | POA: Diagnosis not present

## 2013-06-03 DIAGNOSIS — M7650 Patellar tendinitis, unspecified knee: Secondary | ICD-10-CM | POA: Diagnosis not present

## 2013-06-03 DIAGNOSIS — Z1371 Encounter for nonprocreative screening for genetic disease carrier status: Secondary | ICD-10-CM

## 2013-06-03 DIAGNOSIS — D696 Thrombocytopenia, unspecified: Secondary | ICD-10-CM | POA: Diagnosis present

## 2013-06-03 DIAGNOSIS — G4700 Insomnia, unspecified: Secondary | ICD-10-CM

## 2013-06-03 DIAGNOSIS — E538 Deficiency of other specified B group vitamins: Secondary | ICD-10-CM

## 2013-06-03 DIAGNOSIS — M71161 Other infective bursitis, right knee: Secondary | ICD-10-CM

## 2013-06-03 DIAGNOSIS — Z5181 Encounter for therapeutic drug level monitoring: Secondary | ICD-10-CM | POA: Diagnosis not present

## 2013-06-03 DIAGNOSIS — R195 Other fecal abnormalities: Secondary | ICD-10-CM

## 2013-06-03 DIAGNOSIS — M7040 Prepatellar bursitis, unspecified knee: Principal | ICD-10-CM | POA: Diagnosis present

## 2013-06-03 DIAGNOSIS — C50919 Malignant neoplasm of unspecified site of unspecified female breast: Secondary | ICD-10-CM

## 2013-06-03 DIAGNOSIS — G8929 Other chronic pain: Secondary | ICD-10-CM | POA: Diagnosis not present

## 2013-06-03 LAB — BASIC METABOLIC PANEL
BUN: 11 mg/dL (ref 6–23)
CALCIUM: 10.1 mg/dL (ref 8.4–10.5)
CHLORIDE: 98 meq/L (ref 96–112)
CO2: 28 mEq/L (ref 19–32)
CREATININE: 0.79 mg/dL (ref 0.50–1.10)
GFR calc Af Amer: >90 mL/min (ref >90)
GFR calc non Af Amer: >90 mL/min (ref >90)
Glucose, Bld: 77 mg/dL (ref 70–99)
Potassium: 3.4 mEq/L — ABNORMAL LOW (ref 3.7–5.3)
Sodium: 139 mEq/L (ref 137–147)

## 2013-06-03 LAB — CBC
HEMATOCRIT: 37.8 % (ref 36.0–46.0)
Hemoglobin: 13 g/dL (ref 12.0–15.0)
MCH: 30.1 pg (ref 26.0–34.0)
MCHC: 34.4 g/dL (ref 30.0–36.0)
MCV: 87.5 fL (ref 78.0–100.0)
Platelets: 168 10*3/uL (ref 150–400)
RBC: 4.32 MIL/uL (ref 3.87–5.11)
RDW: 13.6 % (ref 11.5–15.5)
WBC: 6.2 10*3/uL (ref 4.0–10.5)

## 2013-06-03 LAB — PROTIME-INR
INR: 1.47 (ref 0.00–1.49)
Prothrombin Time: 17.4 seconds — ABNORMAL HIGH (ref 11.6–15.2)

## 2013-06-03 LAB — SEDIMENTATION RATE: SED RATE: 28 mm/h — AB (ref 0–22)

## 2013-06-03 MED ORDER — WARFARIN - PHARMACIST DOSING INPATIENT: Freq: Every day | Status: DC

## 2013-06-03 MED ORDER — PANTOPRAZOLE SODIUM 40 MG PO TBEC
80.0000 mg | DELAYED_RELEASE_TABLET | Freq: Every day | ORAL | Status: DC
  Administered 2013-06-03 – 2013-06-08 (×6): 80 mg via ORAL
  Filled 2013-06-03 (×6): qty 2

## 2013-06-03 MED ORDER — DIPHENHYDRAMINE HCL 50 MG/ML IJ SOLN
25.0000 mg | Freq: Once | INTRAMUSCULAR | Status: AC
  Administered 2013-06-03: 25 mg via INTRAVENOUS
  Filled 2013-06-03: qty 1

## 2013-06-03 MED ORDER — VANCOMYCIN HCL IN DEXTROSE 1-5 GM/200ML-% IV SOLN
1000.0000 mg | Freq: Once | INTRAVENOUS | Status: AC
  Administered 2013-06-03: 1000 mg via INTRAVENOUS
  Filled 2013-06-03: qty 200

## 2013-06-03 MED ORDER — ZOLPIDEM TARTRATE 10 MG PO TABS
10.0000 mg | ORAL_TABLET | Freq: Every evening | ORAL | Status: DC | PRN
  Administered 2013-06-03: 10 mg via ORAL
  Filled 2013-06-03: qty 1

## 2013-06-03 MED ORDER — SODIUM CHLORIDE 0.9 % IV SOLN
Freq: Once | INTRAVENOUS | Status: AC
  Administered 2013-06-03: 19:00:00 via INTRAVENOUS

## 2013-06-03 MED ORDER — HYDROXYCHLOROQUINE SULFATE 200 MG PO TABS
200.0000 mg | ORAL_TABLET | Freq: Two times a day (BID) | ORAL | Status: DC
  Administered 2013-06-03 – 2013-06-08 (×10): 200 mg via ORAL
  Filled 2013-06-03 (×11): qty 1

## 2013-06-03 MED ORDER — HEPARIN (PORCINE) IN NACL 100-0.45 UNIT/ML-% IJ SOLN
1000.0000 [IU]/h | INTRAMUSCULAR | Status: DC
  Administered 2013-06-03 – 2013-06-04 (×2): 1000 [IU]/h via INTRAVENOUS
  Filled 2013-06-03 (×3): qty 250

## 2013-06-03 MED ORDER — MORPHINE SULFATE 4 MG/ML IJ SOLN
4.0000 mg | Freq: Once | INTRAMUSCULAR | Status: AC
  Administered 2013-06-03: 4 mg via INTRAVENOUS
  Filled 2013-06-03: qty 1

## 2013-06-03 MED ORDER — CYANOCOBALAMIN 1000 MCG/ML IJ SOLN: 1000.0000 ug | INTRAMUSCULAR | Status: DC

## 2013-06-03 MED ORDER — OXYCODONE-ACETAMINOPHEN 5-325 MG PO TABS
1.0000 | ORAL_TABLET | Freq: Three times a day (TID) | ORAL | Status: DC | PRN
  Administered 2013-06-03 – 2013-06-04 (×2): 1 via ORAL
  Filled 2013-06-03 (×2): qty 1

## 2013-06-03 MED ORDER — FUROSEMIDE 40 MG PO TABS
40.0000 mg | ORAL_TABLET | Freq: Every day | ORAL | Status: DC
  Administered 2013-06-03 – 2013-06-08 (×6): 40 mg via ORAL
  Filled 2013-06-03 (×6): qty 1

## 2013-06-03 MED ORDER — IBUPROFEN 800 MG PO TABS
800.0000 mg | ORAL_TABLET | Freq: Four times a day (QID) | ORAL | Status: DC | PRN
  Filled 2013-06-03: qty 1

## 2013-06-03 MED ORDER — MORPHINE SULFATE 4 MG/ML IJ SOLN
4.0000 mg | INTRAMUSCULAR | Status: DC | PRN
  Administered 2013-06-04: 4 mg via INTRAVENOUS
  Filled 2013-06-03: qty 1

## 2013-06-03 MED ORDER — HEPARIN BOLUS VIA INFUSION
2000.0000 [IU] | Freq: Once | INTRAVENOUS | Status: AC
  Administered 2013-06-03: 2000 [IU] via INTRAVENOUS
  Filled 2013-06-03: qty 2000

## 2013-06-03 MED ORDER — VANCOMYCIN HCL IN DEXTROSE 1-5 GM/200ML-% IV SOLN
1000.0000 mg | Freq: Two times a day (BID) | INTRAVENOUS | Status: DC
  Administered 2013-06-04 – 2013-06-07 (×7): 1000 mg via INTRAVENOUS
  Filled 2013-06-03 (×7): qty 200

## 2013-06-03 MED ORDER — ROPINIROLE HCL 1 MG PO TABS
1.0000 mg | ORAL_TABLET | Freq: Every day | ORAL | Status: DC
  Administered 2013-06-03 – 2013-06-07 (×5): 1 mg via ORAL
  Filled 2013-06-03 (×6): qty 1

## 2013-06-03 MED ORDER — PROMETHAZINE HCL 25 MG PO TABS
25.0000 mg | ORAL_TABLET | Freq: Four times a day (QID) | ORAL | Status: DC | PRN
  Administered 2013-06-04 (×2): 25 mg via ORAL
  Filled 2013-06-03 (×2): qty 1

## 2013-06-03 MED ORDER — WARFARIN SODIUM 10 MG PO TABS
10.0000 mg | ORAL_TABLET | Freq: Once | ORAL | Status: AC
  Administered 2013-06-03: 10 mg via ORAL
  Filled 2013-06-03: qty 1

## 2013-06-03 MED ORDER — POTASSIUM CHLORIDE CRYS ER 20 MEQ PO TBCR
20.0000 meq | EXTENDED_RELEASE_TABLET | Freq: Every day | ORAL | Status: DC
  Administered 2013-06-03 – 2013-06-08 (×6): 20 meq via ORAL
  Filled 2013-06-03 (×6): qty 1

## 2013-06-03 MED ORDER — GABAPENTIN 300 MG PO CAPS
600.0000 mg | ORAL_CAPSULE | Freq: Two times a day (BID) | ORAL | Status: DC
  Administered 2013-06-03 – 2013-06-08 (×10): 600 mg via ORAL
  Filled 2013-06-03 (×11): qty 2

## 2013-06-03 NOTE — ED Notes (Signed)
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 Pt had first dose of Rotuxin 8 days ago, no problems until yesterday with some redness and slight swelling to right knee. Now redness has spread. Pain 5/10, ambulates with pain. Pedal pulse present

## 2013-06-03 NOTE — Progress Notes (Signed)
ANTICOAGULATION CONSULT NOTE - Initial Consult  Pharmacy Consult for Heparin/Warfarin Indication: Mechanical MVR  No Known Allergies  Patient Measurements: Height: _0  (162.6 cm) Weight: 163 lb (73.936 kg) IBW/kg (Calculated) : 54.7 Heparin Dosing Weight: 70kg  Vital Signs: Temp: 98.9 F (37.2 C) (04/15 1832) Temp src: Oral (04/15 1832) BP: 106/80 mmHg (04/15 1855) Pulse Rate: 72 (04/15 1832)  Labs:  Recent Labs  06/03/13 1710  HGB 13.0  HCT 37.8  PLT 168  LABPROT 17.4*  INR 1.47  CREATININE 0.79    Estimated Creatinine Clearance: 88.4 ml/min (by C-G formula based on Cr of 0.79).   Medical History: Past Medical History  Diagnosis Date  . SLE (systemic lupus erythematosus)   . BRCA2 negative   . BRCA1 negative   . 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 B12 deficiency   . Mitral valve disease, rheumatic     St. Jude prosthesis  . Raynaud disease   . Fibromyalgia   . Anxiety   . Peripheral neuropathy   . PONV (postoperative nausea and vomiting)   . Anticoagulation goal of INR 2.5 to 3.5   . Breast cancer 2011    Left side  . Autoimmune hepatitis     Medications:  Scheduled:  . [START ON 06/10/2013] cyanocobalamin  1,000 mcg Intramuscular Q30 days  . furosemide  40 mg Oral Daily  . gabapentin  600 mg Oral BID  . hydroxychloroquine  200 mg Oral BID  . pantoprazole  80 mg Oral Daily  . potassium chloride SA  20 mEq Oral Daily  . rOPINIRole  1 mg Oral QHS   Infusions:  . 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 Dose recently reduced to 76m Tues/Thurs/Sat, 145mall other days due to elevated INR 5.7 on 4/9. Goal INR 2.5-3.5 for mechanical MVR per outpatient anticoag notes. INR subtherapeutic on admission. Since no plans for surgery, IV heparin ordered to bridge until INR therapeutic.    Goal of Therapy:  Heparin level 0.3-0.7 units/ml INR 2.5-3.5 Monitor platelets  by anticoagulation protocol: Yes   Plan:   Warfarin 1089mO x 1 tonight  Heparin 2000 units IV bolus x 1, then infusion 1000 units/hr  Check heparin level with am labs, about 8hr after heparin starts  Daily heparin level, CBC, PT/INR  Plan to dc heparin when INR therapeutic (> or = 2.5)  [name], BCPS Pager: [phone]/2015,8:10 PM

## 2013-06-03 NOTE — ED Provider Notes (Signed)
CSN: [id number]     Arrival date & time 06/03/13  1513 History   First MD Initiated Contact with Patient 06/03/13 1624     Chief Complaint  Patient presents with  . Knee Pain     (Consider location/radiation/quality/duration/timing/severity/associated sxs/prior Treatment) Patient is a 43 y.o. female presenting with knee pain. The history is provided by the patient.  Knee Pain Location:  Knee Injury: no   Pain details:    Quality:  Aching   Radiates to:  Does not radiate   Severity:  Moderate   Onset quality:  Gradual   Timing:  Constant   Progression:  Worsening Chronicity:  New Dislocation: no   Foreign body present:  No foreign bodies Prior injury to area:  No Relieved by:  Nothing Worsened by:  Bearing weight and activity Associated symptoms: fever     Past Medical History  Diagnosis Date  . SLE (systemic lupus erythematosus)   . BRCA2 negative   . BRCA1 negative   . 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 B12 deficiency   . Mitral valve disease, rheumatic     St. Jude prosthesis  . Raynaud disease   . Fibromyalgia   . Anxiety   . Peripheral neuropathy   . PONV (postoperative nausea and vomiting)   . Anticoagulation goal of INR 2.5 to 3.5   . Breast cancer 2011    Left side  . Autoimmune hepatitis    Past Surgical History  Procedure Laterality Date  . Mitral valve replacement    . Cholecystectomy    . Mastectomy    . Cardiac valve replacement    . Breast surgery    . Endometrial ablation  2008    She no longer has menses  . Insertion expander left breast  11/16/10  . Tissue expander placement  02/14/2011    Procedure: TISSUE EXPANDER;  Surgeon: Macon Large;  Location: [place];  Service: Plastics;  Laterality: Bilateral;  Left Breast Remove Tissue Expander Placement of Implant Breast Reconstruction  . Mastopexy  02/14/2011    Procedure: MASTOPEXY;  Surgeon: Macon Large;  Location: [place];  Service: Plastics;  Laterality:  Bilateral;  Right Breast Reduction   . Esophagogastroduodenoscopy N/A 03/25/2013    Procedure: ESOPHAGOGASTRODUODENOSCOPY (EGD);  Surgeon: Daneil Dolin, MD;  [facility];  Service: Endoscopy;  Laterality: N/A;  930   Family History  Problem Relation Age of Onset  . Colon cancer Neg Hx    History  Substance Use Topics  . Smoking status: Former Smoker -- 1.00 packs/day for 15 years    Types: Cigarettes    Quit date: 02/19/2006  . Smokeless tobacco: Never Used  . Alcohol Use: Yes     Comment: Occasional   OB History   Grav Para Term Preterm Abortions TAB SAB Ect Mult Living                 Review of Systems  Constitutional: Positive for fever.  Respiratory: Negative for cough and shortness of breath.   Gastrointestinal: Negative for vomiting and abdominal pain.  All other systems reviewed and are negative.     Allergies  Review of patient's allergies indicates no known allergies.  Home Medications   Prior to Admission medications   Medication Sig Start Date End Date Taking? Authorizing Provider  cyanocobalamin (,VITAMIN B-12,) 1000 MCG/ML injection Inject 1 mL (1,000 mcg total) into the muscle every 30 (thirty) days. 12/19/10   Gaston Islam  Neijstrom, MD  dexlansoprazole (DEXILANT) 60 MG capsule Take 1 capsule (60 mg total) by mouth daily. 04/13/13   Mahala Menghini, PA-C  furosemide (LASIX) 40 MG tablet Take 1 tablet (40 mg total) by mouth daily. 12/18/12   Satira Sark, MD  gabapentin (NEURONTIN) 600 MG tablet Take 600 mg by mouth 2 (two) times daily.     Historical Provider, MD  hydroxychloroquine (PLAQUENIL) 200 MG tablet Take 200 mg by mouth 2 (two) times daily.      Historical Provider, MD  ibuprofen (ADVIL,MOTRIN) 200 MG tablet Take 800 mg by mouth every 6 (six) hours as needed for headache.    Historical Provider, MD  KLOR-CON M20 20 MEQ tablet TAKE ONE TABLET BY MOUTH EVERY DAY 06/11/12   Pieter Partridge, MD  oxyCODONE-acetaminophen (PERCOCET/ROXICET) 5-325  MG per tablet Take 1 tablet by mouth every 8 (eight) hours as needed for moderate pain.  11/10/12   Historical Provider, MD  promethazine (PHENERGAN) 25 MG tablet Take 25 mg by mouth every 6 (six) hours as needed for nausea or vomiting.    Historical Provider, MD  rOPINIRole (REQUIP) 0.5 MG tablet Take 1 mg by mouth at bedtime.     Historical Provider, MD  warfarin (COUMADIN) 5 MG tablet TAKE ONE TABLET BY MOUTH DAILY EXCEPT TWO TABLETS ON TUESDAYS, THURSDAYS, AND SATURDAYS OR AS DIRECTED. 04/08/13   Satira Sark, MD  zolpidem (AMBIEN) 10 MG tablet Take 10 mg by mouth at bedtime as needed for sleep.  12/19/10   Ezra Sites, MD   BP 110/77  Pulse 78  Temp(Src) 100.2 F (37.9 C) (Oral)  Resp 16  Ht 5' 4" (1.626 m)  Wt 163 lb (73.936 kg)  BMI 27.97 kg/m2  SpO2 99% Physical Exam  Nursing note and vitals reviewed. Constitutional: She is oriented to person, place, and time. She appears well-developed and well-nourished. No distress.  HENT:  Head: Normocephalic and atraumatic.  Eyes: EOM are normal. Pupils are equal, round, and reactive to light.  Neck: Normal range of motion. Neck supple.  Cardiovascular: Normal rate and regular rhythm.  Exam reveals no friction rub.   No murmur heard. Pulmonary/Chest: Effort normal and breath sounds normal. No respiratory distress. She has no wheezes. She has no rales.  Abdominal: Soft. She exhibits no distension. There is no tenderness. There is no rebound.  Musculoskeletal: Normal range of motion. She exhibits no edema.       Right knee: She exhibits swelling, effusion and erythema. She exhibits normal range of motion, no deformity, no laceration, normal alignment and no LCL laxity. Tenderness found.  Neurological: She is alert and oriented to person, place, and time.  Skin: No rash noted. She is not diaphoretic.    ED Course  ARTHOCENTESIS Date/Time: 06/03/2013 6:19 PM Performed by: Osvaldo Shipper Authorized by: Osvaldo Shipper Consent: Verbal consent obtained. Time out: Immediately prior to procedure a "time out" was called to verify the correct patient, procedure, equipment, support staff and site/side marked as required. Indications: possible septic joint  Body area: knee Joint: right knee Local anesthesia used: yes Local anesthetic: lidocaine 1% with epinephrine Anesthetic total: 6 ml Patient sedated: no Preparation: Patient was prepped and draped in the usual sterile fashion. Needle gauge: 18 G (21G) Ultrasound guidance: no Approach: medial (Lateral first, medial second) Aspirate: blood-tinged and serous Aspirate amount: 1 ml Patient tolerance: Patient tolerated the procedure well with no immediate complications.   (including critical care time) Labs  Review Labs Reviewed  CULTURE, BLOOD (ROUTINE X 2)  CULTURE, BLOOD (ROUTINE X 2)  CBC  PROTIME-INR  BASIC METABOLIC PANEL    Imaging Review No results found.   EKG Interpretation None      MDM   Final diagnoses:  Septic prepatellar bursitis of right knee  Anticoagulation goal of INR 2.5 to 3.5  Long term (current) use of anticoagulants    1F presents with R knee pain. Hx of lupus, arthritis, recently received rituxin injection last week. Last week INR was supratherapeutic around 5.7. Patient sent from her PCP's office for concern of septic arthritis. R knee redness, pain began last night, worsening, with spreading redness.  On exam, redness over patella, inferior patella. Small knee effusion. Has full ROM, but pain throughout it. Will perform arthrocentesis to look for septic joint if INR ok. Arthrocentesis with minimal results - enough for culture but not cell count.  Dr. Veverly Fells with Ortho evaluated and recommended IV antibiotics for her septic bursitis since she's on immunosuppressive therapy. Admitted to medicine.   Osvaldo Shipper, MD 06/04/13 [phone]

## 2013-06-03 NOTE — Consult Note (Signed)
Reason for Consult:Right knee infection rule out sepsis Referring Physician: EDP  [name] is an 43 y.o. female.  HPI: 43 yo female with complex medical history with a one day history of increased pain and erythema.  NO trauma, no fall.  Patient on immunosuppresive therapy for SLE and autoimmune related hepatits.  Patient s/p MVR, also has history of breast cancer.  Past Medical History  Diagnosis Date  . SLE (systemic lupus erythematosus)   . BRCA2 negative   . BRCA1 negative   . 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 B12 deficiency   . Mitral valve disease, rheumatic     St. Jude prosthesis  . Raynaud disease   . Fibromyalgia   . Anxiety   . Peripheral neuropathy   . PONV (postoperative nausea and vomiting)   . Anticoagulation goal of INR 2.5 to 3.5   . Breast cancer 2011    Left side  . Autoimmune hepatitis     Past Surgical History  Procedure Laterality Date  . Mitral valve replacement    . Cholecystectomy    . Mastectomy    . Cardiac valve replacement    . Breast surgery    . Endometrial ablation  2008    She no longer has menses  . Insertion expander left breast  11/16/10  . Tissue expander placement  02/14/2011    Procedure: TISSUE EXPANDER;  Surgeon: Macon Large;  Location: [place];  Service: Plastics;  Laterality: Bilateral;  Left Breast Remove Tissue Expander Placement of Implant Breast Reconstruction  . Mastopexy  02/14/2011    Procedure: MASTOPEXY;  Surgeon: Macon Large;  Location: [place];  Service: Plastics;  Laterality: Bilateral;  Right Breast Reduction   . Esophagogastroduodenoscopy N/A 03/25/2013    Procedure: ESOPHAGOGASTRODUODENOSCOPY (EGD);  Surgeon: Daneil Dolin, MD;  [facility];  Service: Endoscopy;  Laterality: N/A;  930    Family History  Problem Relation Age of Onset  . Colon cancer Neg Hx     Social History:  reports that she quit smoking about 7 years ago. Her smoking use included Cigarettes.  She has a 15 pack-year smoking history. She has never used smokeless tobacco. She reports that she drinks alcohol. She reports that she does not use illicit drugs.  Allergies: No Known Allergies  Medications: I have reviewed the patient's current medications.  Results for orders placed during the hospital encounter of 06/03/13 (from the past 48 hour(s))  CBC     Status: None   Collection Time    06/03/13  5:10 PM      Result Value Ref Range   WBC 6.2  4.0 - 10.5 K/uL   RBC 4.32  3.87 - 5.11 MIL/uL   Hemoglobin 13.0  12.0 - 15.0 g/dL   HCT 37.8  36.0 - 46.0 %   MCV 87.5  78.0 - 100.0 fL   MCH 30.1  26.0 - 34.0 pg   MCHC 34.4  30.0 - 36.0 g/dL   RDW 13.6  11.5 - 15.5 %   Platelets 168  150 - 400 K/uL  PROTIME-INR     Status: Abnormal   Collection Time    06/03/13  5:10 PM      Result Value Ref Range   Prothrombin Time 17.4 (*) 11.6 - 15.2 seconds   INR 1.47  0.00 - 2.63  BASIC METABOLIC PANEL     Status: Abnormal   Collection Time    06/03/13  5:10 PM      Result Value Ref Range   Sodium 139  137 - 147 mEq/L   Potassium 3.4 (*) 3.7 - 5.3 mEq/L   Chloride 98  96 - 112 mEq/L   CO2 28  19 - 32 mEq/L   Glucose, Bld 77  70 - 99 mg/dL   BUN 11  6 - 23 mg/dL   Creatinine, Ser 0.79  0.50 - 1.10 mg/dL   Calcium 10.1  8.4 - 10.5 mg/dL   GFR calc non Af Amer >90  >90 mL/min   GFR calc Af Amer >90  >90 mL/min   Comment: (NOTE)     The eGFR has been calculated using the CKD EPI equation.     This calculation has not been validated in all clinical situations.     eGFR's persistently <90 mL/min signify possible Chronic Kidney     Disease.    No results found.  ROS Blood pressure 106/80, pulse 72, temperature 98.9 F (37.2 C), temperature source Oral, resp. rate 16, height 5' 4"  (1.626 m), weight 73.936 kg (163 lb), SpO2 99.00%. Physical Exam  Healthy appearing female with malar rash who is comfortable in her ED stretcher.  Right knee with erythema and boggy swelling in the  prepatellar tissues. No effusion and no pain with mid range ROM.  Patient has no cords and no pain with ankle pumps.  Bilateral UE and left LE ROM normal  Assessment/Plan: Prepatellar infectious bursitis and cellulitis.  EDP had aspirated the knee and found very little fluid and sent that for culture.  I would recommend given her immunosupressed state to treat her with a short course of IV antibiotics in the hospital until there is clear clinical  evidence of improvement.  Also recommending rest for the knee with a knee immobilizer and limiting the time on her feet.  Once her knee swelling and erythema is getting better that could be tranferred onto an oral course and followed as an outpatient. We are happy to follow with you.  No surgical intervention necessary.   Augustin Schooling 06/03/2013, 7:29 PM

## 2013-06-03 NOTE — Progress Notes (Signed)
ANTIBIOTIC CONSULT NOTE - INITIAL  Pharmacy Consult for Vancomycin Indication: Bursitis  No Known Allergies  Patient Measurements: Height: _0  (162.6 cm) Weight: 163 lb (73.936 kg) IBW/kg (Calculated) : 54.7  Vital Signs: Temp: 98.9 F (37.2 C) (04/15 1832) Temp src: Oral (04/15 1832) BP: 106/80 mmHg (04/15 1855) Pulse Rate: 72 (04/15 1832) Intake/Output from previous day:   Intake/Output from this shift:    Labs:  Recent Labs  06/03/13 1710  WBC 6.2  HGB 13.0  PLT 168  CREATININE 0.79   Estimated Creatinine Clearance: 88.4 ml/min (by C-G formula based on Cr of 0.79). >100 ml/min/1.97m (normalized)  No results found for this basename: VANCOTROUGH, VANCOPEAK, VANCORANDOM, GENTTROUGH, GENTPEAK, GENTRANDOM, TOBRATROUGH, TOBRAPEAK, TOBRARND, AMIKACINPEAK, AMIKACINTROU, AMIKACIN,  in the last 72 hours   Microbiology: No results found for this or any previous visit (from the past 720 hour(s)).  Medical History: Past Medical History  Diagnosis Date  . SLE (systemic lupus erythematosus)   . BRCA2 negative   . BRCA1 negative   . 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 B12 deficiency   . Mitral valve disease, rheumatic     St. Jude prosthesis  . Raynaud disease   . Fibromyalgia   . Anxiety   . Peripheral neuropathy   . PONV (postoperative nausea and vomiting)   . Anticoagulation goal of INR 2.5 to 3.5   . Breast cancer 2011    Left side  . Autoimmune hepatitis     Medications:  Scheduled:   Infusions:  . vancomycin 1,000 mg (06/03/13 1852)   Assessment: 43yo female with SLE and autoimmune hepatitis on immunosuppressive therapy with rituxan presents with one day history of pain, redness and swelling of right knee. Aspiration performed in ED with small amount of fluid sent for culture. Ortho recommending short course of IV antibiotics given her immunosuppressed state. Vancomycin 1g IV given in ED and Pharmacy consulted to continue  dosing per protocol.  Goal of Therapy:  Vancomycin trough level 10-15 mcg/ml  Plan:   Vancomycin 1g IV q12h Check trough at steady state Follow up renal function & cultures, de-escalation of therapy  EPeggyann Juba PharmD, BCPS Pager: [phone]/15/2015,7:45 PM

## 2013-06-03 NOTE — ED Notes (Signed)
md at bedside  Pt alert and oriented x4. Respirations even and unlabored, bilateral symmetrical rise and fall of chest. Skin warm and dry. In no acute distress. Denies needs.   

## 2013-06-03 NOTE — ED Notes (Signed)
Ice pack applied to pts knee

## 2013-06-03 NOTE — ED Notes (Signed)
Pt from home c/o of right knee pain, swelling, redness and warmness in the middle of the night . She recently started Rotuxin last Tuesday. She seen PCP MD Armandina Gemma today and was referred here for IV abx. MD Hawking his her Rhuematologist and would like to be contacted by the EDP when pt is evaluated.

## 2013-06-03 NOTE — H&P (Signed)
[facility] History and Physical  [name]:[id number] DOB: 1970-10-16 DOA: 06/03/2013  Referring physician: EDP PCP: Purvis Kilts, MD   Chief Complaint: Knee pain   HPI: [name] is a 43 y.o. female who presents to the ED with knee pain.  Pain located in her R knee, infrapatellar region.  Significant overlying erythema, weightbearing and activity makes it worse.  Duration is one day.  Context is patient recently started Rituxan for immunosuppression for SLE with autoimmune hepatitis.  Review of Systems: Systems reviewed.  As above, otherwise negative  Past Medical History  Diagnosis Date  . SLE (systemic lupus erythematosus)   . BRCA2 negative   . BRCA1 negative   . 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 B12 deficiency   . Mitral valve disease, rheumatic     St. Jude prosthesis  . Raynaud disease   . Fibromyalgia   . Anxiety   . Peripheral neuropathy   . PONV (postoperative nausea and vomiting)   . Anticoagulation goal of INR 2.5 to 3.5   . Breast cancer 2011    Left side  . Autoimmune hepatitis    Past Surgical History  Procedure Laterality Date  . Mitral valve replacement    . Cholecystectomy    . Mastectomy    . Cardiac valve replacement    . Breast surgery    . Endometrial ablation  2008    She no longer has menses  . Insertion expander left breast  11/16/10  . Tissue expander placement  02/14/2011    Procedure: TISSUE EXPANDER;  Surgeon: Macon Large;  Location: [place];  Service: Plastics;  Laterality: Bilateral;  Left Breast Remove Tissue Expander Placement of Implant Breast Reconstruction  . Mastopexy  02/14/2011    Procedure: MASTOPEXY;  Surgeon: Macon Large;  Location: [place];  Service: Plastics;  Laterality: Bilateral;  Right Breast Reduction   . Esophagogastroduodenoscopy N/A 03/25/2013    Procedure: ESOPHAGOGASTRODUODENOSCOPY (EGD);  Surgeon: Daneil Dolin, MD;  [facility];  Service:  Endoscopy;  Laterality: N/A;  930   Social History:  reports that she quit smoking about 7 years ago. Her smoking use included Cigarettes. She has a 15 pack-year smoking history. She has never used smokeless tobacco. She reports that she drinks alcohol. She reports that she does not use illicit drugs.  No Known Allergies  Family History  Problem Relation Age of Onset  . Colon cancer Neg Hx      Prior to Admission medications   Medication Sig Start Date End Date Taking? Authorizing Provider  cyanocobalamin (,VITAMIN B-12,) 1000 MCG/ML injection Inject 1 mL (1,000 mcg total) into the muscle every 30 (thirty) days. 12/19/10  Yes Pieter Partridge, MD  dexlansoprazole (DEXILANT) 60 MG capsule Take 1 capsule (60 mg total) by mouth daily. 04/13/13  Yes Mahala Menghini, PA-C  furosemide (LASIX) 40 MG tablet Take 1 tablet (40 mg total) by mouth daily. 12/18/12  Yes Satira Sark, MD  gabapentin (NEURONTIN) 600 MG tablet Take 600 mg by mouth 2 (two) times daily.    Yes Historical Provider, MD  hydroxychloroquine (PLAQUENIL) 200 MG tablet Take 200 mg by mouth 2 (two) times daily.     Yes Historical Provider, MD  ibuprofen (ADVIL,MOTRIN) 200 MG tablet Take 800 mg by mouth every 6 (six) hours as needed for headache.   Yes Historical Provider, MD  potassium chloride SA (K-DUR,KLOR-CON) 20 MEQ tablet Take 20 mEq by  mouth daily.   Yes Historical Provider, MD  promethazine (PHENERGAN) 25 MG tablet Take 25 mg by mouth every 6 (six) hours as needed for nausea or vomiting.   Yes Historical Provider, MD  RiTUXimab (RITUXAN IV) Inject into the vein.   Yes Historical Provider, MD  rOPINIRole (REQUIP) 0.5 MG tablet Take 1 mg by mouth at bedtime.    Yes Historical Provider, MD  warfarin (COUMADIN) 5 MG tablet Take 5-10 mg by mouth daily. Pt takes 5 mg on Tues., Thurs. Sat. And 10 mg on Mon., Wed., Fri. , and Sun.   Yes Historical Provider, MD  zolpidem (AMBIEN) 10 MG tablet Take 10 mg by mouth at bedtime as needed  for sleep.  12/19/10  Yes Ezra Sites, MD  oxyCODONE-acetaminophen (PERCOCET/ROXICET) 5-325 MG per tablet Take 1 tablet by mouth every 8 (eight) hours as needed for moderate pain.  11/10/12   Historical Provider, MD   Physical Exam: Filed Vitals:   06/03/13 1855  BP: 106/80  Pulse:   Temp:   Resp: 16    BP 106/80  Pulse 72  Temp(Src) 98.9 F (37.2 C) (Oral)  Resp 16  Ht _0  (1.626 m)  Wt 73.936 kg (163 lb)  BMI 27.97 kg/m2  SpO2 99%  General Appearance:    Alert, oriented, no distress, appears stated age  Head:    Normocephalic, atraumatic  Eyes:    PERRL, EOMI, sclera non-icteric        Nose:   Nares without drainage or epistaxis. Mucosa, turbinates normal  Throat:   Moist mucous membranes. Oropharynx without erythema or exudate.  Neck:   Supple. No carotid bruits.  No thyromegaly.  No lymphadenopathy.   Back:     No CVA tenderness, no spinal tenderness  Lungs:     Clear to auscultation bilaterally, without wheezes, rhonchi or rales  Chest wall:    No tenderness to palpitation  Heart:    Regular rate and rhythm without murmurs, gallops, rubs  Abdomen:     Soft, non-tender, nondistended, normal bowel sounds, no organomegaly  Genitalia:    deferred  Rectal:    deferred  Extremities:   No clubbing, cyanosis or edema.  Pulses:   2+ and symmetric all extremities  Skin:   Erythema on the anterior surface of the R knee  Lymph nodes:   Cervical, supraclavicular, and axillary nodes normal  Neurologic:   CNII-XII intact. Normal strength, sensation and reflexes      throughout    Labs on Admission:  Basic Metabolic Panel:  Recent Labs Lab 06/03/13 1710  NA 139  K 3.4*  CL 98  CO2 28  GLUCOSE 77  BUN 11  CREATININE 0.79  CALCIUM 10.1   Liver Function Tests: No results found for this basename: AST, ALT, ALKPHOS, BILITOT, PROT, ALBUMIN,  in the last 168 hours No results found for this basename: LIPASE, AMYLASE,  in the last 168 hours No results found for this  basename: AMMONIA,  in the last 168 hours CBC:  Recent Labs Lab 06/03/13 1710  WBC 6.2  HGB 13.0  HCT 37.8  MCV 87.5  PLT 168   Cardiac Enzymes: No results found for this basename: CKTOTAL, CKMB, CKMBINDEX, TROPONINI,  in the last 168 hours  BNP (last 3 results)  Recent Labs  11/14/12 1615  PROBNP 243.90*   CBG: No results found for this basename: GLUCAP,  in the last 168 hours  Radiological Exams on Admission: No results found.  EKG: Independently reviewed.  Assessment/Plan Principal Problem:   Septic prepatellar bursitis of right knee Active Problems:   Systemic lupus erythematosus   Anticoagulation goal of INR 2.5 to 3.5   1. Septic prepatellar bursitis of right knee - ortho has seen patient in consult, joint was tapped but not enough fluid for cell count, was enough for culture but not a septic joint at this point.  Treating bursitis with vancomycin in the setting of immunosuppression.  Knee immobilizer. 2. SLE - continuing home meds, next dose of rituxan was scheduled for ~21st; however, this may be delayed depending on how her septic bursitis progresses. 3. Mechanical MV - chronic coumadin, INR subtheraputic today, will put patient on heparin gtt until INR therapeutic again, pharm to dose both heparin and coumadin.    Code Status: Full Code  Family Communication: Family at bedside Disposition Plan: Admit to inpatient   Time spent: 73 min  [facility] Pager [phone]  If 7AM-7PM, please contact the day team taking care of the patient [email or web address] Password [facility] 06/03/2013, 7:58 PM

## 2013-06-04 DIAGNOSIS — A499 Bacterial infection, unspecified: Secondary | ICD-10-CM | POA: Diagnosis not present

## 2013-06-04 DIAGNOSIS — M7040 Prepatellar bursitis, unspecified knee: Secondary | ICD-10-CM | POA: Diagnosis not present

## 2013-06-04 LAB — COMPREHENSIVE METABOLIC PANEL
ALK PHOS: 110 U/L (ref 39–117)
ALT: 132 U/L — ABNORMAL HIGH (ref 0–35)
AST: 168 U/L — ABNORMAL HIGH (ref 0–37)
Albumin: 3.9 g/dL (ref 3.5–5.2)
BILIRUBIN TOTAL: 0.7 mg/dL (ref 0.3–1.2)
BUN: 10 mg/dL (ref 6–23)
CHLORIDE: 99 meq/L (ref 96–112)
CO2: 25 mEq/L (ref 19–32)
CREATININE: 0.74 mg/dL (ref 0.50–1.10)
Calcium: 9.2 mg/dL (ref 8.4–10.5)
GFR calc Af Amer: >90 mL/min (ref >90)
GFR calc non Af Amer: >90 mL/min (ref >90)
Glucose, Bld: 100 mg/dL — ABNORMAL HIGH (ref 70–99)
POTASSIUM: 3.4 meq/L — AB (ref 3.7–5.3)
Sodium: 138 mEq/L (ref 137–147)
TOTAL PROTEIN: 7.5 g/dL (ref 6.0–8.3)

## 2013-06-04 LAB — CBC
HEMATOCRIT: 34.1 % — AB (ref 36.0–46.0)
Hemoglobin: 11.4 g/dL — ABNORMAL LOW (ref 12.0–15.0)
MCH: 29.8 pg (ref 26.0–34.0)
MCHC: 33.4 g/dL (ref 30.0–36.0)
MCV: 89 fL (ref 78.0–100.0)
PLATELETS: 141 10*3/uL — AB (ref 150–400)
RBC: 3.83 MIL/uL — ABNORMAL LOW (ref 3.87–5.11)
RDW: 13.7 % (ref 11.5–15.5)
WBC: 4.8 10*3/uL (ref 4.0–10.5)

## 2013-06-04 LAB — HEPARIN LEVEL (UNFRACTIONATED)
HEPARIN UNFRACTIONATED: 0.58 [IU]/mL (ref 0.30–0.70)
Heparin Unfractionated: 0.58 IU/mL (ref 0.30–0.70)

## 2013-06-04 LAB — PROTIME-INR
INR: 1.59 — ABNORMAL HIGH (ref 0.00–1.49)
PROTHROMBIN TIME: 18.5 s — AB (ref 11.6–15.2)

## 2013-06-04 MED ORDER — OXYCODONE HCL 5 MG PO TABS
5.0000 mg | ORAL_TABLET | Freq: Four times a day (QID) | ORAL | Status: DC | PRN
  Administered 2013-06-04 – 2013-06-08 (×2): 5 mg via ORAL
  Filled 2013-06-04 (×2): qty 1

## 2013-06-04 MED ORDER — HYDROMORPHONE HCL PF 1 MG/ML IJ SOLN
0.5000 mg | INTRAMUSCULAR | Status: DC | PRN
  Administered 2013-06-04: 0.5 mg via INTRAVENOUS
  Administered 2013-06-05 – 2013-06-07 (×5): 1 mg via INTRAVENOUS
  Filled 2013-06-04 (×6): qty 1

## 2013-06-04 MED ORDER — OXYCODONE-ACETAMINOPHEN 5-325 MG PO TABS
1.0000 | ORAL_TABLET | Freq: Three times a day (TID) | ORAL | Status: DC | PRN
  Administered 2013-06-04 (×2): 1 via ORAL
  Administered 2013-06-05: 2 via ORAL
  Administered 2013-06-05: 1 via ORAL
  Administered 2013-06-06 – 2013-06-07 (×2): 2 via ORAL
  Filled 2013-06-04: qty 2
  Filled 2013-06-04 (×2): qty 1
  Filled 2013-06-04 (×2): qty 2
  Filled 2013-06-04: qty 1

## 2013-06-04 MED ORDER — PROMETHAZINE HCL 25 MG/ML IJ SOLN
12.5000 mg | Freq: Once | INTRAMUSCULAR | Status: AC
  Administered 2013-06-04: 12.5 mg via INTRAVENOUS
  Filled 2013-06-04: qty 1

## 2013-06-04 MED ORDER — WARFARIN SODIUM 7.5 MG PO TABS
7.5000 mg | ORAL_TABLET | Freq: Once | ORAL | Status: AC
  Administered 2013-06-04: 7.5 mg via ORAL
  Filled 2013-06-04: qty 1

## 2013-06-04 MED ORDER — ONDANSETRON HCL 4 MG/2ML IJ SOLN
4.0000 mg | Freq: Three times a day (TID) | INTRAMUSCULAR | Status: DC | PRN
  Administered 2013-06-05: 4 mg via INTRAVENOUS
  Filled 2013-06-04: qty 2

## 2013-06-04 MED ORDER — LEVOTHYROXINE SODIUM 100 MCG PO TABS
100.0000 ug | ORAL_TABLET | Freq: Every day | ORAL | Status: DC
  Administered 2013-06-04 – 2013-06-08 (×5): 100 ug via ORAL
  Filled 2013-06-04 (×6): qty 1

## 2013-06-04 MED ORDER — LEVOTHYROXINE SODIUM 100 MCG PO TABS
100.0000 ug | ORAL_TABLET | Freq: Every day | ORAL | Status: DC
  Filled 2013-06-04: qty 1

## 2013-06-04 NOTE — Progress Notes (Signed)
Assessment unchanged. Pt sleeping without distress. SRP, RN

## 2013-06-04 NOTE — Progress Notes (Signed)
[place], RN, BSN, CCM  [phone]  Chart Reviewed for discharge and hospital needs.  Discharge needs at time of review: None present will follow for needs.  Review of patient progress due on [id number].

## 2013-06-04 NOTE — Progress Notes (Addendum)
ANTICOAGULATION CONSULT NOTE - Follow Up Consult  Pharmacy Consult for Heparin Indication: Mechanical MVR  No Known Allergies  Patient Measurements: Height: 5\' 4"  (162.6 cm) Weight: 163 lb (73.936 kg) IBW/kg (Calculated) : 54.7 Heparin Dosing Weight:   Vital Signs: Temp: 97.8 F (36.6 C) (04/16 0524) Temp src: Oral (04/16 0524) BP: 101/71 mmHg (04/16 0524) Pulse Rate: 65 (04/16 0524)  Labs:  Recent Labs  06/03/13 1710 06/04/13 0403 06/04/13 1234  HGB 13.0 11.4*  --   HCT 37.8 34.1*  --   PLT 168 141*  --   LABPROT 17.4* 18.5*  --   INR 1.47 1.59*  --   HEPARINUNFRC  --  0.58 0.58  CREATININE 0.79 0.74  --     Estimated Creatinine Clearance: 88.4 ml/min (by C-G formula based on Cr of 0.74).   Medications:  Infusions:  . heparin 1,000 Units/hr (06/03/13 2131)    Assessment: 8 yof on chronic warfarin for mechanical MVR. Goal INR 2.5-3.5 for mechanical MVR per outpatient anticoag notes. INR subtherapeutic on admission. Since no plans for surgery, IV heparin ordered to bridge until INR therapeutic.  Home dose recently reduced to 5mg  Tues/Thurs/Sat, 10mg  all other days due to elevated INR 5.7 on 4/9  Confirmative heparin level = 0.58 (therapeutic) on heparin at 1000 units/hr  Today's INR = 1.59  CBC: hgb = 11.4, plts = 141  Drug interactions: on PRN ibuprofen (no doses given)  Coumadin dosing (in patient)  4/15: 10mg   Goal of Therapy:  Heparin level 0.3-0.7 units/ml Monitor platelets by anticoagulation protocol: Yes INR 2.5-3.5   Plan:   Continue heparin at 1000 units/hr as heparin level therapeutic x 2  Coumadin 7.5mg  PO x 1 tonight  Daily INR, CBC, heparin level (watch pltc)  Doreene Eland, PharmD, BCPS.   Pager: [phone]  06/04/2013,12:55 PM

## 2013-06-04 NOTE — Progress Notes (Signed)
ANTICOAGULATION CONSULT NOTE - Follow Up Consult  Pharmacy Consult for Heparin Indication: Mechanical MVR   No Known Allergies  Patient Measurements: Height: 5\' 4"  (162.6 cm) Weight: 163 lb (73.936 kg) IBW/kg (Calculated) : 54.7 Heparin Dosing Weight:   Vital Signs: Temp: 97.8 F (36.6 C) (04/16 0524) Temp src: Oral (04/16 0524) BP: 101/71 mmHg (04/16 0524) Pulse Rate: 65 (04/16 0524)  Labs:  Recent Labs  06/03/13 1710 06/04/13 0403  HGB 13.0 11.4*  HCT 37.8 34.1*  PLT 168 141*  LABPROT 17.4* 18.5*  INR 1.47 1.59*  HEPARINUNFRC  --  0.58  CREATININE 0.79 0.74    Estimated Creatinine Clearance: 88.4 ml/min (by C-G formula based on Cr of 0.74).   Medications:  Infusions:  . heparin 1,000 Units/hr (06/03/13 2131)    Assessment: Patient with heparin level at goal and INR <2.5.  No issues with drip per RN.  Goal of Therapy:  Heparin level 0.3-0.7 units/ml Monitor platelets by anticoagulation protocol: Yes   Plan:  Continue heparin at current rate. Recheck level at [place] 06/04/2013,5:46 AM

## 2013-06-04 NOTE — Progress Notes (Signed)
[facility] PROGRESS NOTE  [name] VPX:[id number] DOB: 04-Jan-1971 DOA: 06/03/2013 PCP: Purvis Kilts, MD  Assessment/Plan:  Principal Problem:   Septic prepatellar bursitis of right knee - Ortho on board and we will f/u with their recommendations - plans will be to continue current IV antibiotics - will f/u with culture from arthrocentesis  Active Problems:   Systemic lupus erythematosus - Hold immunosupresive medications - Continue hydroxychloroquine, gabapentin    Anticoagulation goal of INR 2.5 to 3.5: For history mechanical heart valve - Warfarin per pharmacy consult  Code Status: Full Family Communication: No family at bedside  Disposition Plan: IV antibiotics for now, per recommendations. Awaiting culture results   Consultants:  Orthopedic surgery  Procedures:  Arthrocentesis  Antibiotics:  Vancomycin  HPI/Subjective: The patient has no new complaints. No acute issues reported overnight  Objective: Filed Vitals:   06/04/13 1417  BP: 92/55  Pulse: 62  Temp: 98.1 F (36.7 C)  Resp: 18    Intake/Output Summary (Last 24 hours) at 06/04/13 1623 Last data filed at 06/04/13 1418  Gross per 24 hour  Intake    960 ml  Output      0 ml  Net    960 ml   Filed Weights   06/03/13 1523 06/03/13 2110  Weight: 73.936 kg (163 lb) 73.936 kg (163 lb)    Exam:   General:  Patient in no acute distress, alert and awake  Cardiovascular: Regular rate and rhythm, no murmurs or  Respiratory: You're to auscultation bilaterally no wheezes  Abdomen: Soft, nontender nondistended  Musculoskeletal: No cyanosis or clubbing  Data Reviewed: Basic Metabolic Panel:  Recent Labs Lab 06/03/13 1710 06/04/13 0403  NA 139 138  K 3.4* 3.4*  CL 98 99  CO2 28 25  GLUCOSE 77 100*  BUN 11 10  CREATININE 0.79 0.74  CALCIUM 10.1 9.2   Liver Function Tests:  Recent Labs Lab 06/04/13 0403  AST 168*  ALT 132*  ALKPHOS 110  BILITOT 0.7  PROT 7.5   ALBUMIN 3.9   No results found for this basename: LIPASE, AMYLASE,  in the last 168 hours No results found for this basename: AMMONIA,  in the last 168 hours CBC:  Recent Labs Lab 06/03/13 1710 06/04/13 0403  WBC 6.2 4.8  HGB 13.0 11.4*  HCT 37.8 34.1*  MCV 87.5 89.0  PLT 168 141*   Cardiac Enzymes: No results found for this basename: CKTOTAL, CKMB, CKMBINDEX, TROPONINI,  in the last 168 hours BNP (last 3 results)  Recent Labs  11/14/12 1615  PROBNP 243.90*   CBG: No results found for this basename: GLUCAP,  in the last 168 hours  Recent Results (from the past 240 hour(s))  BODY FLUID CULTURE     Status: None   Collection Time    06/03/13  6:21 PM      Result Value Ref Range Status   Specimen Description SYNOVIAL   Final   Special Requests Normal   Final   Gram Stain     Final   Value: NO WBC SEEN     NO ORGANISMS SEEN     Performed at [facility]   Culture PENDING   Incomplete   Report Status PENDING   Incomplete     Studies: No results found.  Scheduled Meds: . [START ON 06/10/2013] cyanocobalamin  1,000 mcg Intramuscular Q30 days  . furosemide  40 mg Oral Daily  . gabapentin  600 mg Oral BID  . hydroxychloroquine  200 mg Oral BID  . levothyroxine  100 mcg Oral QAC breakfast  . pantoprazole  80 mg Oral Daily  . potassium chloride SA  20 mEq Oral Daily  . rOPINIRole  1 mg Oral QHS  . vancomycin  1,000 mg Intravenous Q12H  . warfarin  7.5 mg Oral ONCE-1800  . Warfarin - Pharmacist Dosing Inpatient   Does not apply q1800   Continuous Infusions: . heparin 1,000 Units/hr (06/03/13 2131)     Time spent: > 35 minutes    [facility] Pager [phone] If 7PM-7AM, please contact night-coverage at [email or web address], password [facility] 06/04/2013, 4:23 PM  LOS: 1 day

## 2013-06-05 ENCOUNTER — Telehealth: Payer: Self-pay | Admitting: *Deleted

## 2013-06-05 DIAGNOSIS — M7040 Prepatellar bursitis, unspecified knee: Secondary | ICD-10-CM | POA: Diagnosis not present

## 2013-06-05 DIAGNOSIS — L02419 Cutaneous abscess of limb, unspecified: Secondary | ICD-10-CM | POA: Diagnosis not present

## 2013-06-05 DIAGNOSIS — A499 Bacterial infection, unspecified: Secondary | ICD-10-CM | POA: Diagnosis not present

## 2013-06-05 DIAGNOSIS — Z7901 Long term (current) use of anticoagulants: Secondary | ICD-10-CM | POA: Diagnosis not present

## 2013-06-05 LAB — HEPARIN LEVEL (UNFRACTIONATED): HEPARIN UNFRACTIONATED: 0.82 [IU]/mL — AB (ref 0.30–0.70)

## 2013-06-05 LAB — CBC
HCT: 35.9 % — ABNORMAL LOW (ref 36.0–46.0)
Hemoglobin: 12.5 g/dL (ref 12.0–15.0)
MCH: 30.6 pg (ref 26.0–34.0)
MCHC: 34.8 g/dL (ref 30.0–36.0)
MCV: 88 fL (ref 78.0–100.0)
PLATELETS: 118 10*3/uL — AB (ref 150–400)
RBC: 4.08 MIL/uL (ref 3.87–5.11)
RDW: 13.6 % (ref 11.5–15.5)
WBC: 4.8 10*3/uL (ref 4.0–10.5)

## 2013-06-05 LAB — PROTIME-INR
INR: 1.83 — ABNORMAL HIGH (ref 0.00–1.49)
PROTHROMBIN TIME: 20.6 s — AB (ref 11.6–15.2)

## 2013-06-05 LAB — APTT
APTT: 99 s — AB (ref 24–37)
aPTT: 94 seconds — ABNORMAL HIGH (ref 24–37)
aPTT: 82 seconds — ABNORMAL HIGH (ref 24–37)

## 2013-06-05 MED ORDER — WARFARIN SODIUM 10 MG PO TABS
10.0000 mg | ORAL_TABLET | Freq: Once | ORAL | Status: AC
  Administered 2013-06-05: 10 mg via ORAL
  Filled 2013-06-05: qty 1

## 2013-06-05 MED ORDER — LEVOFLOXACIN IN D5W 750 MG/150ML IV SOLN
750.0000 mg | INTRAVENOUS | Status: DC
  Administered 2013-06-05 – 2013-06-06 (×2): 750 mg via INTRAVENOUS
  Filled 2013-06-05 (×3): qty 150

## 2013-06-05 MED ORDER — ARGATROBAN 50 MG/50ML IV SOLN
0.7000 ug/kg/min | INTRAVENOUS | Status: DC
  Administered 2013-06-05: 0.7 ug/kg/min via INTRAVENOUS
  Filled 2013-06-05: qty 50

## 2013-06-05 MED ORDER — ARGATROBAN 50 MG/50ML IV SOLN
0.8500 ug/kg/min | INTRAVENOUS | Status: DC
  Administered 2013-06-05: 0.85 ug/kg/min via INTRAVENOUS
  Filled 2013-06-05: qty 50

## 2013-06-05 MED ORDER — HEPARIN (PORCINE) IN NACL 100-0.45 UNIT/ML-% IJ SOLN
850.0000 [IU]/h | INTRAMUSCULAR | Status: DC
  Filled 2013-06-05: qty 250

## 2013-06-05 MED ORDER — ARGATROBAN 50 MG/50ML IV SOLN
0.8500 ug/kg/min | INTRAVENOUS | Status: DC
  Administered 2013-06-05: 1 ug/kg/min via INTRAVENOUS
  Filled 2013-06-05: qty 50

## 2013-06-05 NOTE — Progress Notes (Signed)
ANTICOAGULATION CONSULT NOTE - Initial Consult  Pharmacy Consult for argatroban Indication: rule out HIT, h/o mechanical MVR  No Known Allergies  Patient Measurements: Height: 5' 4"  (162.6 cm) Weight: 163 lb (73.936 kg) IBW/kg (Calculated) : 54.7 Heparin Dosing Weight:   Vital Signs: Temp: 97.9 F (36.6 C) (04/17 1419) Temp src: Oral (04/17 1419) BP: 93/65 mmHg (04/17 1419) Pulse Rate: 58 (04/17 1419)  Labs:  Recent Labs  06/03/13 1710 06/04/13 0403 06/04/13 1234 06/05/13 0401 06/05/13 1400  HGB 13.0 11.4*  --  12.5  --   HCT 37.8 34.1*  --  35.9*  --   PLT 168 141*  --  118*  --   APTT  --   --   --   --  99*  LABPROT 17.4* 18.5*  --  20.6*  --   INR 1.47 1.59*  --  1.83*  --   HEPARINUNFRC  --  0.58 0.58 0.82*  --   CREATININE 0.79 0.74  --   --   --     Estimated Creatinine Clearance: 88.4 ml/min (by C-G formula based on Cr of 0.74).   Medical History: Past Medical History  Diagnosis Date  . SLE (systemic lupus erythematosus)   . BRCA2 negative   . BRCA1 negative   . 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 B12 deficiency   . Mitral valve disease, rheumatic     St. Jude prosthesis  . Raynaud disease   . Fibromyalgia   . Anxiety   . Peripheral neuropathy   . PONV (postoperative nausea and vomiting)   . Anticoagulation goal of INR 2.5 to 3.5   . Breast cancer 2011    Left side  . Autoimmune hepatitis     Assessment: 39 yof on chronic warfarin for mechanical MVR. Goal INR 2.5-3.5 for mechanical MVR per outpatient anticoag notes. INR subtherapeutic on admission. Since no plans for surgery, IV heparin ordered to bridge until INR therapeutic.  Home dose recently reduced to 63m Tues/Thurs/Sat, 170mall other days due to elevated INR 5.7 on 4/9 Today's INR = 1.83 Initial aPTT on Argatroban = 99sec CBC: platelets declining to 118 (168 at admit), Hgb stable Interestingly, when she was on heparin gtt in 08/2012 appears similar  decrease in platelets occurred.  Due to timing it is more consistent with HAT >> HIT as do not see heparin exposure within the last 3 months, but has had heparin in last 9-12 months. The 4 "T"s  HIT probability calculator = 3 (low probability) Drug interactions: starting levofloxacin 4/17, on PRN ibuprofen (no doses given)  Coumadin dosing (in patient) 4/15: 1018m/16: 7.5mg78moal of Therapy:  aPTT 50-90 seconds Monitor platelets by anticoagulation protocol: Yes   Plan:   Initial aPTT is 99 sec (above goal). Effect from heparin may be lingering. Reduce Argatroban gtt to  0.85 mcg/kg/min  Check aPTT ~ 2hr after dose increase  Warfarin 10mg71mx 1 tonight  If suspicion for HIT is high, recommendation is to hold warfarin until platelets recover (d/w [name],[name] Beaversce pltc > 100 will continue with warfarin dosing  Note Argatroban will cause a false elevation in INR, thus when INR is  > 4 - 5, will need to stop Argatroban gtt and check INR to verify therapeutic.   Follow-up with HIT panel results  DustiDoreene ElandrmD, BCPS.   Pager: [phone]/2015,2:42 PM

## 2013-06-05 NOTE — Telephone Encounter (Signed)
Pt wants to speak with lisa, she is in hospital, Please call her about heprin and what the doctors are saying.

## 2013-06-05 NOTE — Progress Notes (Signed)
Pharmacy Consult Note - Argatroban Follow Up  Labs: aPTT 94 sec  A/P: aPTT still supratherapeutic (goal 50-90 sec) at current rate of 0.31mcg/kg/min (3.8 ml/hr). No reported bleeding per RN. Continued elevation of aPTT not likely heparin related at this point. Will reduce rate by another 20% at this time to 0.7 mcg/kg/min (3.1 ml/hr). Per policy, will check another aPTT 2 hrs after rate change.  Adrian Saran, PharmD, BCPS Pager [phone] 06/05/2013 7:29 PM

## 2013-06-05 NOTE — Progress Notes (Signed)
ANTICOAGULATION CONSULT NOTE - Initial Consult  Pharmacy Consult for argatroban Indication: rule out HIT, h/o mechanical MVR  No Known Allergies  Patient Measurements: Height: _0  (162.6 cm) Weight: 163 lb (73.936 kg) IBW/kg (Calculated) : 54.7 Heparin Dosing Weight:   Vital Signs: Temp: 97.7 F (36.5 C) (04/17 0617) Temp src: Oral (04/17 0617) BP: 87/59 mmHg (04/17 0617) Pulse Rate: 72 (04/17 0617)  Labs:  Recent Labs  06/03/13 1710 06/04/13 0403 06/04/13 1234 06/05/13 0401  HGB 13.0 11.4*  --  12.5  HCT 37.8 34.1*  --  35.9*  PLT 168 141*  --  118*  LABPROT 17.4* 18.5*  --  20.6*  INR 1.47 1.59*  --  1.83*  HEPARINUNFRC  --  0.58 0.58 0.82*  CREATININE 0.79 0.74  --   --     Estimated Creatinine Clearance: 88.4 ml/min (by C-G formula based on Cr of 0.74).   Medical History: Past Medical History  Diagnosis Date  . SLE (systemic lupus erythematosus)   . BRCA2 negative   . BRCA1 negative   . 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 B12 deficiency   . Mitral valve disease, rheumatic     St. Jude prosthesis  . Raynaud disease   . Fibromyalgia   . Anxiety   . Peripheral neuropathy   . PONV (postoperative nausea and vomiting)   . Anticoagulation goal of INR 2.5 to 3.5   . Breast cancer 2011    Left side  . Autoimmune hepatitis     Assessment: 50 yof on chronic warfarin for mechanical MVR. Goal INR 2.5-3.5 for mechanical MVR per outpatient anticoag notes. INR subtherapeutic on admission. Since no plans for surgery, IV heparin ordered to bridge until INR therapeutic.  Home dose recently reduced to 97m Tues/Thurs/Sat, 143mall other days due to elevated INR 5.7 on 4/9 Today's INR = 1.83 Heparin level this am was elevated and heparin rate reduced to 850 units/hr CBC: platelets declining to 118 (168 at admit), Hgb stable Interestingly, when she was on heparin gtt in 08/2012 appears similar decrease in platelets occurred.  Due to  timing it is more consistent with HAT >> HIT as do not see heparin exposure within the last 3 months, but has had heparin in last 9-12 months. The 4 "T"s  HIT probability calculator = 3 (low probability) Drug interactions: starting levofloxacin 4/17, on PRN ibuprofen (no doses given)  Coumadin dosing (in patient) 4/15: 1043m/16: 7.5mg7moal of Therapy:  aPTT 50-90 seconds Monitor platelets by anticoagulation protocol: Yes   Plan:   Stop heparin gtt  Start Argatroban at 1mcg63m/min  Check aPTT ~ 2hr after start of argatroban  Warfarin 10mg 78m 1 tonight  If suspicion for HIT is high, recommendation is to hold warfarin until platelets recover (d/w Dr. Vega, Wendee Beaverse pltc > 100 will continue with warfarin dosing  Note Argatroban will cause a false elevation in INR, thus when INR is  > 4 - 5, will need to stop Argatroban gtt and check INR to verify therapeutic.   Follow-up with HIT panel results  DustinDoreene ElandmD, BCPS.   Pager: [phone]/2015,8:34 AM

## 2013-06-05 NOTE — Progress Notes (Signed)
ANTICOAGULATION CONSULT NOTE - Follow Up Consult  Pharmacy Consult for Heparin Indication: Mechanical MVR  No Known Allergies  Patient Measurements: Height: 5\' 4"  (162.6 cm) Weight: 163 lb (73.936 kg) IBW/kg (Calculated) : 54.7 Heparin Dosing Weight:   Vital Signs: Temp: 97.6 F (36.4 C) (04/16 2230) Temp src: Oral (04/16 2230) BP: 100/68 mmHg (04/16 2230) Pulse Rate: 65 (04/16 2230)  Labs:  Recent Labs  06/03/13 1710 06/04/13 0403 06/04/13 1234 06/05/13 0401  HGB 13.0 11.4*  --  12.5  HCT 37.8 34.1*  --  35.9*  PLT 168 141*  --  PENDING  LABPROT 17.4* 18.5*  --  20.6*  INR 1.47 1.59*  --  1.83*  HEPARINUNFRC  --  0.58 0.58 0.82*  CREATININE 0.79 0.74  --   --     Estimated Creatinine Clearance: 88.4 ml/min (by C-G formula based on Cr of 0.74).   Medications:  Infusions:  . heparin      Assessment: Patient with high heparin level.  No issues per RN.  Goal of Therapy:  Heparin level 0.3-0.7 units/ml Monitor platelets by anticoagulation protocol: Yes   Plan:  Decrease heparin to 850 units/hr Recheck level at [place]. 06/05/2013,5:27 AM

## 2013-06-05 NOTE — Progress Notes (Signed)
Orthopedics Progress Note  Subjective: It is better but still hurts  Objective:  Filed Vitals:   06/05/13 0617  BP: 87/59  Pulse: 72  Temp: 97.7 F (36.5 C)  Resp: 16    General: Awake and alert  Musculoskeletal: right knee with less intense erythema and a minimal decrease in the swelling in the prepatellar area.  No pain with AROM of the knee. No calf swelling. No cords. Neurovascularly intact  Lab Results  Component Value Date   WBC 4.8 06/05/2013   HGB 12.5 06/05/2013   HCT 35.9* 06/05/2013   MCV 88.0 06/05/2013   PLT 118* 06/05/2013       Component Value Date/Time   NA 138 06/04/2013 0403   K 3.4* 06/04/2013 0403   CL 99 06/04/2013 0403   CO2 25 06/04/2013 0403   GLUCOSE 100* 06/04/2013 0403   BUN 10 06/04/2013 0403   CREATININE 0.74 06/04/2013 0403   CREATININE 0.88 11/14/2012 1615   CALCIUM 9.2 06/04/2013 0403   GFRNONAA >90 06/04/2013 0403   GFRAA >90 06/04/2013 0403    Lab Results  Component Value Date   INR 1.83* 06/05/2013   INR 1.59* 06/04/2013   INR 1.47 06/03/2013    Assessment/Plan: Prepatellar septic bursitis and cellulitis, improved There is improvement but not to the point where I think that we should D/C the IV antibiotics. ? Expand coverage. Continue soft tissue rest There is also a thrombocytopenia that has begun since admission which has progressed (?heparin related) Will follow  Doran Heater. Veverly Fells, MD 06/05/2013 7:10 AM

## 2013-06-05 NOTE — Progress Notes (Signed)
[facility] PROGRESS NOTE  ANNALIS KACZMARCZYK FTD:[id number] DOB: [birth year] DOA: 06/03/2013 PCP: Purvis Kilts, MD  Assessment/Plan:  Principal Problem:   Septic prepatellar bursitis of right knee - Ortho on board and we will f/u with their recommendations - plans will be to continue current IV antibiotics, Added Levaquin to broaden spectrum - will f/u with culture from arthrocentesis  Active Problems:   Systemic lupus erythematosus - Hold immunosupresive medications - Continue hydroxychloroquine, gabapentin    Anticoagulation goal of INR 2.5 to 3.5: For history mechanical heart valve - Warfarin per pharmacy consult  Code Status: Full Family Communication: No family at bedside  Disposition Plan: IV antibiotics for now, per recommendations. Awaiting culture results   Consultants:  Orthopedic surgery  Procedures:  Arthrocentesis  Antibiotics:  Vancomycin  Levaquin  HPI/Subjective: The patient has no new complaints. No acute issues reported overnight.  States that her knee is improved  Objective: Filed Vitals:   06/05/13 1419  BP: 93/65  Pulse: 58  Temp: 97.9 F (36.6 C)  Resp: 16    Intake/Output Summary (Last 24 hours) at 06/05/13 1618 Last data filed at 06/05/13 1420  Gross per 24 hour  Intake   1810 ml  Output      0 ml  Net   1810 ml   Filed Weights   06/03/13 1523 06/03/13 2110  Weight: 73.936 kg (163 lb) 73.936 kg (163 lb)    Exam:   General:  Patient in no acute distress, alert and awake  Cardiovascular: Regular rate and rhythm, no murmurs or  Respiratory: You're to auscultation bilaterally no wheezes  Abdomen: Soft, nontender nondistended  Musculoskeletal: No cyanosis or clubbing, right knee cellulitis improved, skin indurated, no area of fluctuance  Data Reviewed: Basic Metabolic Panel:  Recent Labs Lab 06/03/13 1710 06/04/13 0403  NA 139 138  K 3.4* 3.4*  CL 98 99  CO2 28 25  GLUCOSE 77 100*  BUN 11 10   CREATININE 0.79 0.74  CALCIUM 10.1 9.2   Liver Function Tests:  Recent Labs Lab 06/04/13 0403  AST 168*  ALT 132*  ALKPHOS 110  BILITOT 0.7  PROT 7.5  ALBUMIN 3.9   No results found for this basename: LIPASE, AMYLASE,  in the last 168 hours No results found for this basename: AMMONIA,  in the last 168 hours CBC:  Recent Labs Lab 06/03/13 1710 06/04/13 0403 06/05/13 0401  WBC 6.2 4.8 4.8  HGB 13.0 11.4* 12.5  HCT 37.8 34.1* 35.9*  MCV 87.5 89.0 88.0  PLT 168 141* 118*   Cardiac Enzymes: No results found for this basename: CKTOTAL, CKMB, CKMBINDEX, TROPONINI,  in the last 168 hours BNP (last 3 results)  Recent Labs  11/14/12 1615  PROBNP 243.90*   CBG: No results found for this basename: GLUCAP,  in the last 168 hours  Recent Results (from the past 240 hour(s))  CULTURE, BLOOD (ROUTINE X 2)     Status: None   Collection Time    06/03/13  5:10 PM      Result Value Ref Range Status   Specimen Description BLOOD RIGHT ARM   Final   Special Requests BOTTLES DRAWN AEROBIC AND ANAEROBIC 5ML   Final   Culture  Setup Time     Final   Value: 06/04/2013 03:48     Performed at [facility]   Culture     Final   Value:        BLOOD CULTURE RECEIVED NO GROWTH TO  DATE CULTURE WILL BE HELD FOR 5 DAYS BEFORE ISSUING A FINAL NEGATIVE REPORT     Performed at [facility]   Report Status PENDING   Incomplete  CULTURE, BLOOD (ROUTINE X 2)     Status: None   Collection Time    06/03/13  5:15 PM      Result Value Ref Range Status   Specimen Description BLOOD RIGHT FOREARM   Final   Special Requests BOTTLES DRAWN AEROBIC AND ANAEROBIC 1ML   Final   Culture  Setup Time     Final   Value: 06/04/2013 03:49     Performed at [facility]   Culture     Final   Value:        BLOOD CULTURE RECEIVED NO GROWTH TO DATE CULTURE WILL BE HELD FOR 5 DAYS BEFORE ISSUING A FINAL NEGATIVE REPORT     Performed at [facility]   Report Status PENDING    Incomplete  BODY FLUID CULTURE     Status: None   Collection Time    06/03/13  6:21 PM      Result Value Ref Range Status   Specimen Description SYNOVIAL   Final   Special Requests Normal   Final   Gram Stain     Final   Value: NO WBC SEEN     NO ORGANISMS SEEN     Performed at [facility]   Culture     Final   Value: NO GROWTH 1 DAY     Performed at [facility]   Report Status PENDING   Incomplete     Studies: No results found.  Scheduled Meds: . [START ON 06/10/2013] cyanocobalamin  1,000 mcg Intramuscular Q30 days  . furosemide  40 mg Oral Daily  . gabapentin  600 mg Oral BID  . hydroxychloroquine  200 mg Oral BID  . levofloxacin (LEVAQUIN) IV  750 mg Intravenous Q24H  . levothyroxine  100 mcg Oral QAC breakfast  . pantoprazole  80 mg Oral Daily  . potassium chloride SA  20 mEq Oral Daily  . rOPINIRole  1 mg Oral QHS  . vancomycin  1,000 mg Intravenous Q12H  . warfarin  10 mg Oral ONCE-1800  . Warfarin - Pharmacist Dosing Inpatient   Does not apply q1800   Continuous Infusions: . argatroban 0.85 mcg/kg/min (06/05/13 1536)     Time spent: > 35 minutes    [facility] Pager [phone] If 7PM-7AM, please contact night-coverage at [email or web address], password [facility] 06/05/2013, 4:18 PM  LOS: 2 days

## 2013-06-05 NOTE — Telephone Encounter (Signed)
Called and talked with pt.  She just wanted reassurance that her coumadin was being managed correctly in the hospital.  Pt assured.

## 2013-06-05 NOTE — Progress Notes (Signed)
ANTIBIOTIC CONSULT NOTE - FOLLOW UP  Pharmacy Consult for vancomcyin/levofloxacin Indication: bursitis/cellulitis  No Known Allergies  Patient Measurements: Height: 5\' 4"  (162.6 cm) Weight: 163 lb (73.936 kg) IBW/kg (Calculated) : 54.7 Adjusted Body Weight:   Vital Signs: Temp: 97.7 F (36.5 C) (04/17 0617) Temp src: Oral (04/17 0617) BP: 87/59 mmHg (04/17 0617) Pulse Rate: 72 (04/17 0617) Intake/Output from previous day: 04/16 0701 - 04/17 0700 In: 2220 [P.O.:1440; I.V.:80; IV Piggyback:700] Out: -  Intake/Output from this shift: Total I/O In: 240 [P.O.:240] Out: -   Labs:  Recent Labs  06/03/13 1710 06/04/13 0403 06/05/13 0401  WBC 6.2 4.8 4.8  HGB 13.0 11.4* 12.5  PLT 168 141* 118*  CREATININE 0.79 0.74  --    Estimated Creatinine Clearance: 88.4 ml/min (by C-G formula based on Cr of 0.74). No results found for this basename: VANCOTROUGH, VANCOPEAK, VANCORANDOM, GENTTROUGH, GENTPEAK, GENTRANDOM, TOBRATROUGH, TOBRAPEAK, TOBRARND, AMIKACINPEAK, AMIKACINTROU, AMIKACIN,  in the last 72 hours   Microbiology: Recent Results (from the past 720 hour(s))  CULTURE, BLOOD (ROUTINE X 2)     Status: None   Collection Time    06/03/13  5:10 PM      Result Value Ref Range Status   Specimen Description BLOOD RIGHT ARM   Final   Special Requests BOTTLES DRAWN AEROBIC AND ANAEROBIC 5ML   Final   Culture  Setup Time     Final   Value: 06/04/2013 03:48     Performed at [facility]   Culture     Final   Value:        BLOOD CULTURE RECEIVED NO GROWTH TO DATE CULTURE WILL BE HELD FOR 5 DAYS BEFORE ISSUING A FINAL NEGATIVE REPORT     Performed at [facility]   Report Status PENDING   Incomplete  CULTURE, BLOOD (ROUTINE X 2)     Status: None   Collection Time    06/03/13  5:15 PM      Result Value Ref Range Status   Specimen Description BLOOD RIGHT FOREARM   Final   Special Requests BOTTLES DRAWN AEROBIC AND ANAEROBIC 1ML   Final   Culture  Setup Time      Final   Value: 06/04/2013 03:49     Performed at [facility]   Culture     Final   Value:        BLOOD CULTURE RECEIVED NO GROWTH TO DATE CULTURE WILL BE HELD FOR 5 DAYS BEFORE ISSUING A FINAL NEGATIVE REPORT     Performed at [facility]   Report Status PENDING   Incomplete  BODY FLUID CULTURE     Status: None   Collection Time    06/03/13  6:21 PM      Result Value Ref Range Status   Specimen Description SYNOVIAL   Final   Special Requests Normal   Final   Gram Stain     Final   Value: NO WBC SEEN     NO ORGANISMS SEEN     Performed at [facility]   Culture     Final   Value: NO GROWTH 1 DAY     Performed at [facility]   Report Status PENDING   Incomplete    Anti-infectives   Start     Dose/Rate Route Frequency Ordered Stop   06/05/13 1200  levofloxacin (LEVAQUIN) IVPB 750 mg     750 mg 100 mL/hr over 90 Minutes Intravenous Every 24 hours  06/05/13 1031     06/04/13 0600  vancomycin (VANCOCIN) IVPB 1000 mg/200 mL premix     1,000 mg 200 mL/hr over 60 Minutes Intravenous Every 12 hours 06/03/13 1952     06/03/13 2200  hydroxychloroquine (PLAQUENIL) tablet 200 mg     200 mg Oral 2 times daily 06/03/13 1953     06/03/13 1830  vancomycin (VANCOCIN) IVPB 1000 mg/200 mL premix     1,000 mg 200 mL/hr over 60 Minutes Intravenous  Once 06/03/13 1821 06/03/13 1955      Assessment: 43 yo female with SLE and autoimmune hepatitis on immunosuppressive therapy with rituxan presents with one day history of pain, redness and swelling of right knee. Aspiration performed in ED with small amount of fluid sent for culture. Ortho recommending short course of IV antibiotics given her immunosuppressed state. Vancomycin 1g IV given in ED and Pharmacy consulted to continue dosing per protocol.  4/15 >> Vanc >> 4/17 >> levofloxacin >>  Tmax: afeb  WBCs: 4.8 Renal: SCr = 0.74, CrCl 88CG, 100N  4/15 knee aspirate: NGTD (no organisms seen on gram stain) 4/15  blood: NGTD  Goal of Therapy:  Vancomycin trough level 10-15 mcg/ml  Plan:  Day #2 vancomycin/D1 levofloxacin  Continue vancomycin 1gm IV q12h  Check vancomycin trough if vancomycin likely to continue > 4-5 days (per ASHP/IDSA recommendation for less aggressive trough goal)  Levofloxacin 750mg  IV q24h  Watch possible effect on warfarin and INR  Doreene Eland, PharmD, BCPS.   Pager: [phone]  06/05/2013,10:45 AM

## 2013-06-06 DIAGNOSIS — M7040 Prepatellar bursitis, unspecified knee: Secondary | ICD-10-CM | POA: Diagnosis not present

## 2013-06-06 DIAGNOSIS — A499 Bacterial infection, unspecified: Secondary | ICD-10-CM | POA: Diagnosis not present

## 2013-06-06 LAB — CBC
HCT: 32.8 % — ABNORMAL LOW (ref 36.0–46.0)
Hemoglobin: 11.4 g/dL — ABNORMAL LOW (ref 12.0–15.0)
MCH: 30.5 pg (ref 26.0–34.0)
MCHC: 34.8 g/dL (ref 30.0–36.0)
MCV: 87.7 fL (ref 78.0–100.0)
PLATELETS: 143 10*3/uL — AB (ref 150–400)
RBC: 3.74 MIL/uL — AB (ref 3.87–5.11)
RDW: 13.5 % (ref 11.5–15.5)
WBC: 4.6 10*3/uL (ref 4.0–10.5)

## 2013-06-06 LAB — PROTIME-INR
INR: 4.79 — AB (ref 0.00–1.49)
Prothrombin Time: 43 seconds — ABNORMAL HIGH (ref 11.6–15.2)

## 2013-06-06 LAB — APTT
APTT: 100 s — AB (ref 24–37)
APTT: 84 s — AB (ref 24–37)
APTT: 86 s — AB (ref 24–37)

## 2013-06-06 MED ORDER — PROMETHAZINE HCL 25 MG PO TABS
25.0000 mg | ORAL_TABLET | Freq: Once | ORAL | Status: AC
  Administered 2013-06-06: 25 mg via ORAL
  Filled 2013-06-06: qty 1

## 2013-06-06 MED ORDER — PROMETHAZINE HCL 25 MG PO TABS
25.0000 mg | ORAL_TABLET | Freq: Four times a day (QID) | ORAL | Status: DC | PRN
  Administered 2013-06-06: 25 mg via ORAL
  Filled 2013-06-06: qty 1

## 2013-06-06 MED ORDER — WARFARIN SODIUM 7.5 MG PO TABS
7.5000 mg | ORAL_TABLET | Freq: Once | ORAL | Status: AC
  Administered 2013-06-06: 7.5 mg via ORAL
  Filled 2013-06-06: qty 1

## 2013-06-06 MED ORDER — ARGATROBAN 50 MG/50ML IV SOLN
0.6000 ug/kg/min | INTRAVENOUS | Status: DC
  Administered 2013-06-06 – 2013-06-07 (×3): 0.6 ug/kg/min via INTRAVENOUS
  Filled 2013-06-06 (×3): qty 50

## 2013-06-06 NOTE — Progress Notes (Signed)
[facility] PROGRESS NOTE  JYL CHICO WUJ:[id number] DOB: 09/17/1970 DOA: 06/03/2013 PCP: Purvis Kilts, MD  Assessment I have seen and examined patient at bedside and reviewed her chart. Appreciate orthopedics. [name] is a pleasant 44 y.o. female who presented to the ED on 06/03/13 with right knee pain. She was found to have a septic prepatellar of the right knee after the arthrocentesis in the ED by Orthopedics. Context is that patient was recently started Rituxan for immunosuppression for SLE with autoimmune hepatitis. She is currently on broad-spectrum IV antibiotics Plan  Septic prepatellar bursitis of right knee  - Ortho on board. Appreciate help. Will continue to follow recommendations  - continue current IV antibiotics, till inflammation subsided -  f/u culture from arthrocentesis, which is negative so far.  Systemic lupus erythematosus  - Hold immunosupresive medications  - Continue hydroxychloroquine, gabapentin  Anticoagulation goal of INR 2.5 to 3.5: Patient has mechanical heart valve. Thrombocytopenia. - Warfarin per pharmacy consult. Appreciate pharmacy input. -INR 4.79 today -If the INR remains a high in the next 24 hours, may need to back off on the Coumadin dose, yet would not want to prematurely reduce or stop heparin as patient at high risk of forming blood clots. -Thrombocytopenia appears to be transient. Will continue to monitor for now. Code Status: Full  Family Communication: No family at bedside  Disposition Plan: IV antibiotics for now, per recommendations. Awaiting culture results  Consultants:  Orthopedic surgery Procedures:  Arthrocentesis Antibiotics:  Vancomycin  Levaquin  HPI/Subjective: Reports that the inflammation of the knee has subsided significantly. However, she states that it's still quite painful.  Objective: Filed Vitals:   06/06/13 0548  BP: 92/64  Pulse: 63  Temp: 97.8 F (36.6 C)  Resp: 16    Intake/Output  Summary (Last 24 hours) at 06/06/13 0945 Last data filed at 06/05/13 1420  Gross per 24 hour  Intake    630 ml  Output      0 ml  Net    630 ml   Filed Weights   06/03/13 1523 06/03/13 2110  Weight: 73.936 kg (163 lb) 73.936 kg (163 lb)    Exam:   General:  Comfortable at rest.  Cardiovascular: RRR. No murmurs.  Respiratory: Lungs clear.  Abdomen: Benign  Musculoskeletal: Erythema of the right knee with evidence of subsiding inflammation but still tender to palpation.  Data Reviewed: Basic Metabolic Panel:  Recent Labs Lab 06/03/13 1710 06/04/13 0403  NA 139 138  K 3.4* 3.4*  CL 98 99  CO2 28 25  GLUCOSE 77 100*  BUN 11 10  CREATININE 0.79 0.74  CALCIUM 10.1 9.2   Liver Function Tests:  Recent Labs Lab 06/04/13 0403  AST 168*  ALT 132*  ALKPHOS 110  BILITOT 0.7  PROT 7.5  ALBUMIN 3.9   No results found for this basename: LIPASE, AMYLASE,  in the last 168 hours No results found for this basename: AMMONIA,  in the last 168 hours CBC:  Recent Labs Lab 06/03/13 1710 06/04/13 0403 06/05/13 0401 06/06/13 0230  WBC 6.2 4.8 4.8 4.6  HGB 13.0 11.4* 12.5 11.4*  HCT 37.8 34.1* 35.9* 32.8*  MCV 87.5 89.0 88.0 87.7  PLT 168 141* 118* 143*   Cardiac Enzymes: No results found for this basename: CKTOTAL, CKMB, CKMBINDEX, TROPONINI,  in the last 168 hours BNP (last 3 results)  Recent Labs  11/14/12 1615  PROBNP 243.90*   CBG: No results found for this basename: GLUCAP,  in the last 168 hours  Recent Results (from the past 240 hour(s))  CULTURE, BLOOD (ROUTINE X 2)     Status: None   Collection Time    06/03/13  5:10 PM      Result Value Ref Range Status   Specimen Description BLOOD RIGHT ARM   Final   Special Requests BOTTLES DRAWN AEROBIC AND ANAEROBIC 5ML   Final   Culture  Setup Time     Final   Value: 06/04/2013 03:48     Performed at [facility]   Culture     Final   Value:        BLOOD CULTURE RECEIVED NO GROWTH TO DATE CULTURE  WILL BE HELD FOR 5 DAYS BEFORE ISSUING A FINAL NEGATIVE REPORT     Performed at [facility]   Report Status PENDING   Incomplete  CULTURE, BLOOD (ROUTINE X 2)     Status: None   Collection Time    06/03/13  5:15 PM      Result Value Ref Range Status   Specimen Description BLOOD RIGHT FOREARM   Final   Special Requests BOTTLES DRAWN AEROBIC AND ANAEROBIC 1ML   Final   Culture  Setup Time     Final   Value: 06/04/2013 03:49     Performed at [facility]   Culture     Final   Value:        BLOOD CULTURE RECEIVED NO GROWTH TO DATE CULTURE WILL BE HELD FOR 5 DAYS BEFORE ISSUING A FINAL NEGATIVE REPORT     Performed at [facility]   Report Status PENDING   Incomplete  BODY FLUID CULTURE     Status: None   Collection Time    06/03/13  6:21 PM      Result Value Ref Range Status   Specimen Description SYNOVIAL   Final   Special Requests Normal   Final   Gram Stain     Final   Value: NO WBC SEEN     NO ORGANISMS SEEN     Performed at [facility]   Culture     Final   Value: NO GROWTH 1 DAY     Performed at [facility]   Report Status PENDING   Incomplete     Studies: No results found.  Scheduled Meds: . [START ON 06/10/2013] cyanocobalamin  1,000 mcg Intramuscular Q30 days  . furosemide  40 mg Oral Daily  . gabapentin  600 mg Oral BID  . hydroxychloroquine  200 mg Oral BID  . levofloxacin (LEVAQUIN) IV  750 mg Intravenous Q24H  . levothyroxine  100 mcg Oral QAC breakfast  . pantoprazole  80 mg Oral Daily  . potassium chloride SA  20 mEq Oral Daily  . rOPINIRole  1 mg Oral QHS  . vancomycin  1,000 mg Intravenous Q12H  . Warfarin - Pharmacist Dosing Inpatient   Does not apply q1800   Continuous Infusions: . argatroban 0.6 mcg/kg/min (06/06/13 1027)     [name]  [facility] Pager [phone]. If 7PM-7AM, please contact night-coverage at [email or web address], password [facility] 06/06/2013, 9:45 AM  LOS: 3 days

## 2013-06-06 NOTE — Progress Notes (Signed)
ANTICOAGULATION CONSULT NOTE - Follow Up Consult  Pharmacy Consult for Argatroban Indication: rule out HIT, h/o mechanical MVR   No Known Allergies  Patient Measurements: Height: 5\' 4"  (162.6 cm) Weight: 163 lb (73.936 kg) IBW/kg (Calculated) : 54.7 Heparin Dosing Weight:   Vital Signs: Temp: 98.6 F (37 C) (04/17 2157) Temp src: Oral (04/17 2157) BP: 110/76 mmHg (04/17 2157) Pulse Rate: 63 (04/17 2157)  Labs:  Recent Labs  06/03/13 1710 06/04/13 0403 06/04/13 1234 06/05/13 0401  06/05/13 1848 06/05/13 2210 06/06/13 0230  HGB 13.0 11.4*  --  12.5  --   --   --  11.4*  HCT 37.8 34.1*  --  35.9*  --   --   --  32.8*  PLT 168 141*  --  118*  --   --   --  143*  APTT  --   --   --   --   < > 94* 82* 100*  LABPROT 17.4* 18.5*  --  20.6*  --   --   --  43.0*  INR 1.47 1.59*  --  1.83*  --   --   --  4.79*  HEPARINUNFRC  --  0.58 0.58 0.82*  --   --   --   --   CREATININE 0.79 0.74  --   --   --   --   --   --   < > = values in this interval not displayed.  Estimated Creatinine Clearance: 88.4 ml/min (by C-G formula based on Cr of 0.74).   Medications:  Infusions:  . argatroban 0.6 mcg/kg/min (06/06/13 0341)    Assessment: Patient with rule out HIT, h/o mechanical MVR.  PTT at 2200 4/17 at goal, no issues per RN.  Recheck level at 0230 high, again no issues per RN. INR >4 but this is due to argatroban - lab interaction, will not address at this time.  Goal of Therapy:  aPTT 50-90 seconds Monitor platelets by anticoagulation protocol: Yes   Plan:  After high PTT, reduce argatroban to 0.75mcg/kg/min and recheck level in 2 hours  [employer]. 06/06/2013,3:42 AM

## 2013-06-06 NOTE — Progress Notes (Signed)
ANTICOAGULATION CONSULT NOTE - Follow Up Consult  Pharmacy Consult for argatroban Indication: rule out HIT, h/o mechanical MVR  No Known Allergies  Patient Measurements: Height: 5\' 4"  (162.6 cm) Weight: 163 lb (73.936 kg) IBW/kg (Calculated) : 54.7 Heparin Dosing Weight:   Vital Signs: Temp: 97.8 F (36.6 C) (04/18 0548) Temp src: Oral (04/18 0548) BP: 92/64 mmHg (04/18 0548) Pulse Rate: 63 (04/18 0548)  Labs:  Recent Labs  06/03/13 1710 06/04/13 0403 06/04/13 1234 06/05/13 0401  06/05/13 2210 06/06/13 0230 06/06/13 0522  HGB 13.0 11.4*  --  12.5  --   --  11.4*  --   HCT 37.8 34.1*  --  35.9*  --   --  32.8*  --   PLT 168 141*  --  118*  --   --  143*  --   APTT  --   --   --   --   < > 82* 100* 84*  LABPROT 17.4* 18.5*  --  20.6*  --   --  43.0*  --   INR 1.47 1.59*  --  1.83*  --   --  4.79*  --   HEPARINUNFRC  --  0.58 0.58 0.82*  --   --   --   --   CREATININE 0.79 0.74  --   --   --   --   --   --   < > = values in this interval not displayed.  Estimated Creatinine Clearance: 88.4 ml/min (by C-G formula based on Cr of 0.74).   Medications:  Infusions:  . argatroban 0.6 mcg/kg/min (06/06/13 0341)    Assessment: Patient with PTT at goal.  No issues per RN.   Goal of Therapy:  aPTT 50-90 seconds Monitor platelets by anticoagulation protocol: Yes   Plan:  Continue argatroban at current rate. Recheck PTT at 0800  [place]. 06/06/2013,6:17 AM

## 2013-06-06 NOTE — Progress Notes (Signed)
Subjective: Pt doing well this AM, does c/o some nausea with emesis this AM but no symptoms now. Pt denies fever, chills, chest pain, SOB, calf pain, or paresthesia b/l.   Objective: Vital signs in last 24 hours: Temp:  [97.8 F (36.6 C)-98.6 F (37 C)] 97.8 F (36.6 C) (04/18 0548) Pulse Rate:  [58-63] 63 (04/18 0548) Resp:  [16] 16 (04/18 0548) BP: (92-110)/(64-76) 92/64 mmHg (04/18 0548) SpO2:  [95 %-97 %] 95 % (04/18 0548)  Intake/Output from previous day: 04/17 0701 - 04/18 0700 In: 630 [P.O.:480; IV Piggyback:150] Out: -  Intake/Output this shift:     Recent Labs  06/03/13 1710 06/04/13 0403 06/05/13 0401 06/06/13 0230  HGB 13.0 11.4* 12.5 11.4*    Recent Labs  06/05/13 0401 06/06/13 0230  WBC 4.8 4.6  RBC 4.08 3.74*  HCT 35.9* 32.8*  PLT 118* 143*    Recent Labs  06/03/13 1710 06/04/13 0403  NA 139 138  K 3.4* 3.4*  CL 98 99  CO2 28 25  BUN 11 10  CREATININE 0.79 0.74  GLUCOSE 77 100*  CALCIUM 10.1 9.2    Recent Labs  06/05/13 0401 06/06/13 0230  INR 1.83* 4.79*    Right knee:+TTP to medial and lateral patella, mild prepatellar edema with no effusion noted. No erythema noted. Mild edema to prepatellar bursa.  Lower extremities are NVI with good motor control of plantar/dorsi flexion of great toes and feet b/l.   Assessment/Plan: Pt will continue antibiotics and f/u per Dr. Veverly Fells No dressing to change today   Judeth Cornfield Josefita Weissmann 06/06/2013, 9:19 AM

## 2013-06-06 NOTE — Progress Notes (Signed)
ANTICOAGULATION CONSULT NOTE - Follow Up Consult  Pharmacy Consult for argatroban, warfarin Indication: rule out HIT, h/o mechanical MVR  No Known Allergies  Patient Measurements: Height: _0  (162.6 cm) Weight: 163 lb (73.936 kg) IBW/kg (Calculated) : 54.7 Heparin Dosing Weight:   Vital Signs: Temp: 97.8 F (36.6 C) (04/18 0548) Temp src: Oral (04/18 0548) BP: 92/64 mmHg (04/18 0548) Pulse Rate: 63 (04/18 0548)  Labs:  Recent Labs  06/03/13 1710 06/04/13 0403 06/04/13 1234 06/05/13 0401  06/06/13 0230 06/06/13 0522 06/06/13 0808  HGB 13.0 11.4*  --  12.5  --  11.4*  --   --   HCT 37.8 34.1*  --  35.9*  --  32.8*  --   --   PLT 168 141*  --  118*  --  143*  --   --   APTT  --   --   --   --   < > 100* 84* 86*  LABPROT 17.4* 18.5*  --  20.6*  --  43.0*  --   --   INR 1.47 1.59*  --  1.83*  --  4.79*  --   --   HEPARINUNFRC  --  0.58 0.58 0.82*  --   --   --   --   CREATININE 0.79 0.74  --   --   --   --   --   --   < > = values in this interval not displayed.  Estimated Creatinine Clearance: 88.4 ml/min (by C-G formula based on Cr of 0.74).   Medical History: Past Medical History  Diagnosis Date  . SLE (systemic lupus erythematosus)   . BRCA2 negative   . BRCA1 negative   . 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 B12 deficiency   . Mitral valve disease, rheumatic     St. Jude prosthesis  . Raynaud disease   . Fibromyalgia   . Anxiety   . Peripheral neuropathy   . PONV (postoperative nausea and vomiting)   . Anticoagulation goal of INR 2.5 to 3.5   . Breast cancer 2011    Left side  . Autoimmune hepatitis     Assessment: 18 yof on chronic warfarin for mechanical MVR. Goal INR 2.5-3.5 for mechanical MVR per outpatient anticoag notes. INR subtherapeutic on admission. Since no plans for surgery, IV heparin ordered to bridge until INR therapeutic.  Home dose recently reduced to 88m Tues/Thurs/Sat, 138mall other days due to  elevated INR 5.7 on 4/9 Today's INR = 4.79 (at goal) aPTT on Argatroban = 86sec (at goal) CBC: platelets improved to 143  Interestingly, when she was on heparin gtt in 08/2012 appears similar decrease in platelets occurred.  Due to timing it is more consistent with HAT >> HIT as do not see heparin exposure within the last 3 months, but has had heparin in last 9-12 months. The 4 "T"s  HIT probability calculator = 3 (low probability). HIT panel pending. Drug interactions: levofloxacin (started 4/17), on PRN ibuprofen (no doses given)  Coumadin dosing (in patient) 4/15: 1023m/16: 7.5mg35m17: 10mg23mal of Therapy:  INR goal >4 while on argatroban, goal 2.5-3.5 off of argatroban aPTT 50-90 seconds Monitor platelets by anticoagulation protocol: Yes   Plan:   The aPTT remains therapeutic with argatroban infusion at 0.6 mcg/kg/min.  Continue infusion at current rate.  Will reduce aPTT monitoring to daily now.  Warfarin 7.5mg p86m 1 tonight.  INR most likely nearing INR  goal of 2.5-3.5 since INR is now >4 while on argatroban.  Will f/u INR in AM and if INR remains >/=4 24 hours later, then will hold argatroban and check INR 4-6 hours after infusion stopped to verify therapeutic INR.  Follow-up with HIT panel results (pending).  Hershal Coria, PharmD, BCPS Pager: [phone] 06/06/2013 9:46 AM

## 2013-06-07 DIAGNOSIS — M7040 Prepatellar bursitis, unspecified knee: Secondary | ICD-10-CM | POA: Diagnosis not present

## 2013-06-07 DIAGNOSIS — A499 Bacterial infection, unspecified: Secondary | ICD-10-CM | POA: Diagnosis not present

## 2013-06-07 LAB — CBC
HCT: 34.9 % — ABNORMAL LOW (ref 36.0–46.0)
Hemoglobin: 12.1 g/dL (ref 12.0–15.0)
MCH: 30.6 pg (ref 26.0–34.0)
MCHC: 34.7 g/dL (ref 30.0–36.0)
MCV: 88.1 fL (ref 78.0–100.0)
Platelets: 139 10*3/uL — ABNORMAL LOW (ref 150–400)
RBC: 3.96 MIL/uL (ref 3.87–5.11)
RDW: 13.4 % (ref 11.5–15.5)
WBC: 4.4 10*3/uL (ref 4.0–10.5)

## 2013-06-07 LAB — COMPREHENSIVE METABOLIC PANEL
ALK PHOS: 107 U/L (ref 39–117)
ALT: 133 U/L — ABNORMAL HIGH (ref 0–35)
AST: 61 U/L — ABNORMAL HIGH (ref 0–37)
Albumin: 3.4 g/dL — ABNORMAL LOW (ref 3.5–5.2)
BUN: 11 mg/dL (ref 6–23)
CO2: 31 mEq/L (ref 19–32)
CREATININE: 0.82 mg/dL (ref 0.50–1.10)
Calcium: 9.1 mg/dL (ref 8.4–10.5)
Chloride: 95 mEq/L — ABNORMAL LOW (ref 96–112)
GFR calc non Af Amer: 86 mL/min — ABNORMAL LOW (ref >90)
GLUCOSE: 87 mg/dL (ref 70–99)
POTASSIUM: 3.6 meq/L — AB (ref 3.7–5.3)
Sodium: 135 mEq/L — ABNORMAL LOW (ref 137–147)
TOTAL PROTEIN: 7.2 g/dL (ref 6.0–8.3)
Total Bilirubin: 0.2 mg/dL — ABNORMAL LOW (ref 0.3–1.2)

## 2013-06-07 LAB — BODY FLUID CULTURE
CULTURE: NO GROWTH
GRAM STAIN: NONE SEEN
SPECIAL REQUESTS: NORMAL

## 2013-06-07 LAB — APTT: aPTT: 86 seconds — ABNORMAL HIGH (ref 24–37)

## 2013-06-07 LAB — PROTIME-INR
INR: 3.92 — ABNORMAL HIGH (ref 0.00–1.49)
Prothrombin Time: 36.9 seconds — ABNORMAL HIGH (ref 11.6–15.2)

## 2013-06-07 MED ORDER — LEVOFLOXACIN 500 MG PO TABS
500.0000 mg | ORAL_TABLET | Freq: Every day | ORAL | Status: DC
  Administered 2013-06-07 – 2013-06-08 (×2): 500 mg via ORAL
  Filled 2013-06-07 (×2): qty 1

## 2013-06-07 MED ORDER — DIPHENHYDRAMINE HCL 12.5 MG/5ML PO ELIX
12.5000 mg | ORAL_SOLUTION | Freq: Once | ORAL | Status: AC
  Administered 2013-06-07: 12.5 mg via ORAL
  Filled 2013-06-07: qty 5

## 2013-06-07 MED ORDER — CEPHALEXIN 500 MG PO CAPS
500.0000 mg | ORAL_CAPSULE | Freq: Three times a day (TID) | ORAL | Status: DC
  Administered 2013-06-07 – 2013-06-08 (×5): 500 mg via ORAL
  Filled 2013-06-07 (×7): qty 1

## 2013-06-07 MED ORDER — WARFARIN SODIUM 10 MG PO TABS
10.0000 mg | ORAL_TABLET | Freq: Once | ORAL | Status: AC
  Administered 2013-06-07: 10 mg via ORAL
  Filled 2013-06-07: qty 1

## 2013-06-07 MED ORDER — DOCUSATE SODIUM 100 MG PO CAPS
100.0000 mg | ORAL_CAPSULE | Freq: Two times a day (BID) | ORAL | Status: DC
  Administered 2013-06-07 – 2013-06-08 (×4): 100 mg via ORAL

## 2013-06-07 MED ORDER — POLYETHYLENE GLYCOL 3350 17 G PO PACK
17.0000 g | PACK | Freq: Every day | ORAL | Status: DC
  Administered 2013-06-07: 17 g via ORAL

## 2013-06-07 NOTE — Progress Notes (Signed)
ANTICOAGULATION CONSULT NOTE - Follow Up Consult  Pharmacy Consult for argatroban, warfarin Indication: rule out HIT, h/o mechanical MVR  No Known Allergies  Patient Measurements: Height: _0  (162.6 cm) Weight: 163 lb (73.936 kg) IBW/kg (Calculated) : 54.7  Vital Signs: Temp: 97.9 F (36.6 C) (04/19 0455) Temp src: Oral (04/19 0455) BP: 108/71 mmHg (04/19 0605) Pulse Rate: 65 (04/19 0455)  Labs:  Recent Labs  06/04/13 1234  06/05/13 0401  06/06/13 0230 06/06/13 0522 06/06/13 0808 06/07/13 0657  HGB  --   < > 12.5  --  11.4*  --   --  12.1  HCT  --   --  35.9*  --  32.8*  --   --  34.9*  PLT  --   --  118*  --  143*  --   --  139*  APTT  --   --   --   < > 100* 84* 86* 86*  LABPROT  --   --  20.6*  --  43.0*  --   --  36.9*  INR  --   --  1.83*  --  4.79*  --   --  3.92*  HEPARINUNFRC 0.58  --  0.82*  --   --   --   --   --   CREATININE  --   --   --   --   --   --   --  0.82  < > = values in this interval not displayed.  Estimated Creatinine Clearance: 86.2 ml/min (by C-G formula based on Cr of 0.82).   Medical History: Past Medical History  Diagnosis Date  . SLE (systemic lupus erythematosus)   . BRCA2 negative   . BRCA1 negative   . 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 B12 deficiency   . Mitral valve disease, rheumatic     St. Jude prosthesis  . Raynaud disease   . Fibromyalgia   . Anxiety   . Peripheral neuropathy   . PONV (postoperative nausea and vomiting)   . Anticoagulation goal of INR 2.5 to 3.5   . Breast cancer 2011    Left side  . Autoimmune hepatitis     Assessment: 1 yof on chronic warfarin for mechanical MVR. Goal INR 2.5-3.5 for mechanical MVR per outpatient anticoag notes. INR subtherapeutic on admission. Since no plans for surgery, IV heparin ordered to bridge until INR therapeutic.  After heparin started, MD concerned for HIT with dropping platelets and patient was transitioned to argatroban for bridge.   Note argatroban, when co-administered with warfarin, produces a combined effect on the laboratory measurement of the INR; however, does not affect the Vitamin-K dependent factor Xa activity. Home dose recently reduced to 57m Tues/Thurs/Sat, 131mall other days due to elevated INR 5.7 on 4/9 Today's INR = 3.92 (goal >4-5)  INR had jumped up to 4.79 yesterday but continued argatroban because didn't think that INR would be therapeutic so quickly.  If INR>4 tomorrow, would like to check an INR off of argatroban. aPTT on Argatroban = 86sec (at goal) CBC: platelets 139, Hgb stable Interestingly, when she was on heparin gtt in 08/2012 appears similar decrease in platelets occurred.  Due to timing it is more consistent with HAT >> HIT as do not see heparin exposure within the last 3 months, but has had heparin in last 9-12 months. The 4 "T"s  HIT probability calculator = 3 (low probability). HIT panel pending. Drug interactions: levofloxacin (  started 4/17), on PRN ibuprofen (no doses given)  Coumadin dosing (in patient) 4/15: 59m 4/16: 7.55m4/17: 1032m/18: 7.5mg39moal of Therapy:  INR goal >4 while on argatroban, goal 2.5-3.5 off of argatroban aPTT 50-90 seconds Monitor platelets by anticoagulation protocol: Yes   Plan:   The aPTT remains therapeutic with argatroban infusion at 0.6 mcg/kg/min.  Continue infusion at current rate.  Daily aPTT monitoring at this point since has been stable.  Warfarin 10mg59mx 1 tonight.    F/u INR in AM and if INR >/=4, then may consider checking an INR 4-6 hours after infusion stopped to verify therapeutic INR.  Follow-up with HIT panel results (pending).  AmandHershal CoriarmD, BCPS Pager: [phone] 12:22 PM

## 2013-06-07 NOTE — Discharge Instructions (Signed)
No Orthopaedic restrictions Contact office if problems around knee recur

## 2013-06-07 NOTE — Progress Notes (Signed)
[facility] PROGRESS NOTE  MACKLYN GLANDON BJS:[id number] DOB: [birth year] DOA: 06/03/2013 PCP: Purvis Kilts, MD  Assessment/Plan:  Principal Problem:   Septic prepatellar bursitis of right knee - Ortho on board and we will f/u with their recommendations - plans will be to continue current antibiotics, Levaquin and Keflex - will f/u with culture from arthrocentesis  Active Problems:   Systemic lupus erythematosus - Hold immunosupresive medications - Continue hydroxychloroquine, gabapentin    Anticoagulation goal of INR 2.5 to 3.5: For history mechanical heart valve - Warfarin per pharmacy consult - Goal is affected by argatroban.  We will await recommendations from pharmacist  Code Status: Full Family Communication: No family at bedside  Disposition Plan: Once INR therapeutic will plan on discharging.   Consultants:  Orthopedic surgery  Procedures:  Arthrocentesis  Antibiotics:  Levaquin and Keflex  HPI/Subjective: The patient has no new complaints. No acute issues reported overnight.  Is inquiring about discharge.  Objective: Filed Vitals:   06/07/13 0605  BP: 108/71  Pulse:   Temp:   Resp:     Intake/Output Summary (Last 24 hours) at 06/07/13 1256 Last data filed at 06/07/13 0713  Gross per 24 hour  Intake    924 ml  Output      0 ml  Net    924 ml   Filed Weights   06/03/13 1523 06/03/13 2110  Weight: 73.936 kg (163 lb) 73.936 kg (163 lb)    Exam:   General:  Patient in no acute distress, alert and awake  Cardiovascular: Regular rate and rhythm, no rubs  Respiratory: Clear to auscultation bilaterally no wheezes  Abdomen: Soft, nontender nondistended  Musculoskeletal: No cyanosis or clubbing, right knee cellulitis improved, skin indurated, no area of fluctuance  Data Reviewed: Basic Metabolic Panel:  Recent Labs Lab 06/03/13 1710 06/04/13 0403 06/07/13 0657  NA 139 138 135*  K 3.4* 3.4* 3.6*  CL 98 99 95*  CO2 28 25 31    GLUCOSE 77 100* 87  BUN 11 10 11   CREATININE 0.79 0.74 0.82  CALCIUM 10.1 9.2 9.1   Liver Function Tests:  Recent Labs Lab 06/04/13 0403 06/07/13 0657  AST 168* 61*  ALT 132* 133*  ALKPHOS 110 107  BILITOT 0.7 0.2*  PROT 7.5 7.2  ALBUMIN 3.9 3.4*   No results found for this basename: LIPASE, AMYLASE,  in the last 168 hours No results found for this basename: AMMONIA,  in the last 168 hours CBC:  Recent Labs Lab 06/03/13 1710 06/04/13 0403 06/05/13 0401 06/06/13 0230 06/07/13 0657  WBC 6.2 4.8 4.8 4.6 4.4  HGB 13.0 11.4* 12.5 11.4* 12.1  HCT 37.8 34.1* 35.9* 32.8* 34.9*  MCV 87.5 89.0 88.0 87.7 88.1  PLT 168 141* 118* 143* 139*   Cardiac Enzymes: No results found for this basename: CKTOTAL, CKMB, CKMBINDEX, TROPONINI,  in the last 168 hours BNP (last 3 results)  Recent Labs  11/14/12 1615  PROBNP 243.90*   CBG: No results found for this basename: GLUCAP,  in the last 168 hours  Recent Results (from the past 240 hour(s))  CULTURE, BLOOD (ROUTINE X 2)     Status: None   Collection Time    06/03/13  5:10 PM      Result Value Ref Range Status   Specimen Description BLOOD RIGHT ARM   Final   Special Requests BOTTLES DRAWN AEROBIC AND ANAEROBIC 5ML   Final   Culture  Setup Time     Final  Value: 06/04/2013 03:48     Performed at [facility]   Culture     Final   Value:        BLOOD CULTURE RECEIVED NO GROWTH TO DATE CULTURE WILL BE HELD FOR 5 DAYS BEFORE ISSUING A FINAL NEGATIVE REPORT     Performed at [facility]   Report Status PENDING   Incomplete  CULTURE, BLOOD (ROUTINE X 2)     Status: None   Collection Time    06/03/13  5:15 PM      Result Value Ref Range Status   Specimen Description BLOOD RIGHT FOREARM   Final   Special Requests BOTTLES DRAWN AEROBIC AND ANAEROBIC 1ML   Final   Culture  Setup Time     Final   Value: 06/04/2013 03:49     Performed at [facility]   Culture     Final   Value:        BLOOD CULTURE  RECEIVED NO GROWTH TO DATE CULTURE WILL BE HELD FOR 5 DAYS BEFORE ISSUING A FINAL NEGATIVE REPORT     Performed at [facility]   Report Status PENDING   Incomplete  BODY FLUID CULTURE     Status: None   Collection Time    06/03/13  6:21 PM      Result Value Ref Range Status   Specimen Description SYNOVIAL   Final   Special Requests Normal   Final   Gram Stain     Final   Value: NO WBC SEEN     NO ORGANISMS SEEN     Performed at [facility]   Culture     Final   Value: NO GROWTH 3 DAYS     Performed at [facility]   Report Status 06/07/2013 FINAL   Final     Studies: No results found.  Scheduled Meds: . cephALEXin  500 mg Oral 3 times per day  . [START ON 06/10/2013] cyanocobalamin  1,000 mcg Intramuscular Q30 days  . docusate sodium  100 mg Oral BID  . furosemide  40 mg Oral Daily  . gabapentin  600 mg Oral BID  . hydroxychloroquine  200 mg Oral BID  . levofloxacin  500 mg Oral Daily  . levothyroxine  100 mcg Oral QAC breakfast  . pantoprazole  80 mg Oral Daily  . potassium chloride SA  20 mEq Oral Daily  . rOPINIRole  1 mg Oral QHS  . warfarin  10 mg Oral ONCE-1800  . Warfarin - Pharmacist Dosing Inpatient   Does not apply q1800   Continuous Infusions: . argatroban 0.6 mcg/kg/min (06/07/13 0259)     Time spent: > 35 minutes    [facility] Pager [phone] If 7PM-7AM, please contact night-coverage at [email or web address], password [facility] 06/07/2013, 12:56 PM  LOS: 4 days

## 2013-06-07 NOTE — Progress Notes (Signed)
Patient ID: ALYSSAMARIE MOUNSEY, female   DOB: 10-03-1970, 43 y.o.   MRN: [id number] Subjective: Doing well, no events States that her knee looks normal again  Prepatellar bursitis right knee in patient on immunosuppressive medications for Lupus, RA and mechanical heart valve      Patient reports pain as mild just around knee, non-radiating   Objective:   VITALS:   Filed Vitals:   06/07/13 0605  BP: 108/71  Pulse:   Temp:   Resp:     Neurovascular intact No cellulitis present Compartment soft Demarcated area of erythema significantly reduced, no swelling  LABS  Recent Labs  06/05/13 0401 06/06/13 0230 06/07/13 0657  HGB 12.5 11.4* 12.1  HCT 35.9* 32.8* 34.9*  WBC 4.8 4.6 4.4  PLT 118* 143* 139*     Recent Labs  06/07/13 0657  NA 135*  K 3.6*  BUN 11  CREATININE 0.82  GLUCOSE 87     Recent Labs  06/06/13 0230 06/07/13 0657  INR 4.79* 3.92*     Assessment/Plan: Right knee prepatellar bursitis resolved with antibiotic use     {Plan: OK to discharge to home Orthopaedically, no procedures necessary Would continue PO antibiotics for 12 days, (Levaquin daily and Keflex, TID for 12 days?)

## 2013-06-08 DIAGNOSIS — M7040 Prepatellar bursitis, unspecified knee: Secondary | ICD-10-CM

## 2013-06-08 DIAGNOSIS — A499 Bacterial infection, unspecified: Secondary | ICD-10-CM

## 2013-06-08 DIAGNOSIS — B9689 Other specified bacterial agents as the cause of diseases classified elsewhere: Secondary | ICD-10-CM

## 2013-06-08 LAB — CBC
HCT: 34 % — ABNORMAL LOW (ref 36.0–46.0)
Hemoglobin: 11.7 g/dL — ABNORMAL LOW (ref 12.0–15.0)
MCH: 30 pg (ref 26.0–34.0)
MCHC: 34.4 g/dL (ref 30.0–36.0)
MCV: 87.2 fL (ref 78.0–100.0)
Platelets: 152 10*3/uL (ref 150–400)
RBC: 3.9 MIL/uL (ref 3.87–5.11)
RDW: 13.4 % (ref 11.5–15.5)
WBC: 4.2 10*3/uL (ref 4.0–10.5)

## 2013-06-08 LAB — PROTIME-INR
INR: 4.3 — ABNORMAL HIGH (ref 0.00–1.49)
INR: 2.54 — AB (ref 0.00–1.49)
PROTHROMBIN TIME: 39.6 s — AB (ref 11.6–15.2)
PROTHROMBIN TIME: 26.5 s — AB (ref 11.6–15.2)

## 2013-06-08 LAB — APTT: APTT: 85 s — AB (ref 24–37)

## 2013-06-08 MED ORDER — LEVOFLOXACIN 500 MG PO TABS: 500.0000 mg | ORAL_TABLET | Freq: Every day | ORAL | Status: DC

## 2013-06-08 MED ORDER — OXYCODONE HCL 5 MG PO TABS: 5.0000 mg | ORAL_TABLET | Freq: Four times a day (QID) | ORAL | Status: DC | PRN

## 2013-06-08 MED ORDER — CEPHALEXIN 500 MG PO CAPS: 500.0000 mg | ORAL_CAPSULE | Freq: Three times a day (TID) | ORAL | Status: DC

## 2013-06-08 NOTE — Progress Notes (Signed)
ANTICOAGULATION CONSULT NOTE - Follow Up Consult  Pharmacy Consult for argatroban, warfarin Indication: rule out HIT, h/o mechanical MVR   No Known Allergies  Patient Measurements: Height: 5\' 4"  (162.6 cm) Weight: 163 lb (73.936 kg) IBW/kg (Calculated) : 54.7 Heparin Dosing Weight:   Vital Signs: Temp: 97.6 F (36.4 C) (04/20 0520) Temp src: Oral (04/20 0520) BP: 103/70 mmHg (04/20 0520) Pulse Rate: 63 (04/20 0520)  Labs:  Recent Labs  06/06/13 0230  06/06/13 0808 06/07/13 0657 06/08/13 0450  HGB 11.4*  --   --  12.1 11.7*  HCT 32.8*  --   --  34.9* 34.0*  PLT 143*  --   --  139* 152  APTT 100*  < > 86* 86* 85*  LABPROT 43.0*  --   --  36.9* 39.6*  INR 4.79*  --   --  3.92* 4.30*  CREATININE  --   --   --  0.82  --   < > = values in this interval not displayed.  Estimated Creatinine Clearance: 86.2 ml/min (by C-G formula based on Cr of 0.82).   Medications:  Scheduled:  . cephALEXin  500 mg Oral 3 times per day  . [START ON 06/10/2013] cyanocobalamin  1,000 mcg Intramuscular Q30 days  . docusate sodium  100 mg Oral BID  . furosemide  40 mg Oral Daily  . gabapentin  600 mg Oral BID  . hydroxychloroquine  200 mg Oral BID  . levofloxacin  500 mg Oral Daily  . levothyroxine  100 mcg Oral QAC breakfast  . pantoprazole  80 mg Oral Daily  . polyethylene glycol  17 g Oral Daily  . potassium chloride SA  20 mEq Oral Daily  . rOPINIRole  1 mg Oral QHS  . Warfarin - Pharmacist Dosing Inpatient   Does not apply q1800   Infusions:    Assessment: Patient with argatroban drip at goal.  No issues with drip or bleeding per RN.  INR >4 and ~5days since warfarin resumed.   Goal of Therapy:  INR 2-3 Monitor platelets by anticoagulation protocol: Yes   Plan:  D/c argatroban--RN informed to not throw med away, yet Recheck INR at 1200 If INR at goal, d/c argatroban completely If below goal, restart argatroban  [employer]. 06/08/2013,5:40 AM

## 2013-06-08 NOTE — Discharge Summary (Signed)
Physician Discharge Summary  ANNALISA COLONNA QIO:[id number] DOB: 1970-03-25 DOA: 06/03/2013  PCP: Purvis Kilts, MD  Admit date: 06/03/2013 Discharge date: 06/08/2013  Time spent: > 35 minutes  Recommendations for Outpatient Follow-up:  F/U INR in [facility] on either Wed 4/22 or Thurs 4/23  F/u with your primary care physician in 1-2 weeks or sooner should you have any new concerns.  Discharge Diagnoses:  Principal Problem:   Septic prepatellar bursitis of right knee Active Problems:   Systemic lupus erythematosus   Anticoagulation goal of INR 2.5 to 3.5   Discharge Condition: stable  Diet recommendation: heart healthy diet  Filed Weights   06/03/13 1523 06/03/13 2110  Weight: 73.936 kg (163 lb) 73.936 kg (163 lb)    History of present illness:  43 year old Caucasian female who presented to the ED with knee pain. Patient has history of mechanical heart valve and was recently treated with Rituxan for immunosuppression for systemic lupus erythematosus with autoimmune hepatitis.  Hospital Course:  Principal Problem:  Septic prepatellar bursitis of right knee  - Ortho on board and patient status post arthrocentesis. Cultures reporting no growth the patient with marked improvement on antibiotics. - We will continue Levaquin and Keflex on discharge for 10 more days after discharge. This will complete a 14 day total course  Active Problems:  Systemic lupus erythematosus  - Hold immunosupresive medications rituxan. Patient to followup with her physician managing SLE for further recommendations on when to continue this medication in the near future. - Continue hydroxychloroquine, gabapentin   Anticoagulation goal of INR 2.5 to 3.5: For history mechanical heart valve  - Based on pharmacist recommendation: 1. Resume warfarin 10 mg daily except 5 mg on Tuesday, Thursday, and Saturday.  2. F/U INR in [facility]  on either Wed 4/22 or Thurs 4/23 -While in house patient had marked decrease in platelets while on heparin as such was switched over to argatroban.  Procedures:  Arthrocentesis: Reported no growth but improved with antibiotics  Consultations:  Ortho: Dr. Alvan Dame  Discharge Exam: Filed Vitals:   06/08/13 1330  BP: 104/69  Pulse:   Temp: 97.6 F (36.4 C)  Resp: 19    General: Pt in NAD, alert and awake Cardiovascular: RRR, no rubs Respiratory: CTA BL, no wheezes  Discharge Instructions You were cared for by a hospitalist during your hospital stay. If you have any questions about your discharge medications or the care you received while you were in the hospital after you are discharged, you can call the unit and asked to speak with the hospitalist on call if the hospitalist that took care of you is not available. Once you are discharged, your primary care physician will handle any further medical issues. Please note that NO REFILLS for any discharge medications will be authorized once you are discharged, as it is imperative that you return to your primary care physician (or establish a relationship with a primary care physician if you do not have one) for your aftercare needs so that they can reassess your need for medications and monitor your lab values.  Discharge Orders   Future Appointments Provider Department Dept Phone   06/22/2013 10:30 AM [place] [phone]   06/23/2013 9:30 AM Orvil Feil, NP [facility] [phone]   06/25/2013 1:30 PM Baird Cancer, PA-C [facility] [phone]   Future Orders Complete By Expires   Call MD for:  difficulty breathing,  headache or visual disturbances  As directed    Call MD for:  persistant dizziness or light-headedness  As directed    Call MD for:  severe uncontrolled pain  As directed    Call MD for:  temperature >100.4  As directed    Diet - low sodium heart healthy  As  directed    Discharge instructions  As directed    Increase activity slowly  As directed        Medication List    STOP taking these medications       RITUXAN IV      TAKE these medications       cephALEXin 500 MG capsule  Commonly known as:  KEFLEX  Take 1 capsule (500 mg total) by mouth every 8 (eight) hours.     cyanocobalamin 1000 MCG/ML injection  Commonly known as:  (VITAMIN B-12)  Inject 1 mL (1,000 mcg total) into the muscle every 30 (thirty) days.     dexlansoprazole 60 MG capsule  Commonly known as:  DEXILANT  Take 1 capsule (60 mg total) by mouth daily.     furosemide 40 MG tablet  Commonly known as:  LASIX  Take 1 tablet (40 mg total) by mouth daily.     gabapentin 600 MG tablet  Commonly known as:  NEURONTIN  Take 600 mg by mouth 2 (two) times daily.     hydroxychloroquine 200 MG tablet  Commonly known as:  PLAQUENIL  Take 200 mg by mouth 2 (two) times daily.     ibuprofen 200 MG tablet  Commonly known as:  ADVIL,MOTRIN  Take 800 mg by mouth every 6 (six) hours as needed for headache.     levofloxacin 500 MG tablet  Commonly known as:  LEVAQUIN  Take 1 tablet (500 mg total) by mouth daily.  Start taking on:  06/09/2013     levothyroxine 100 MCG tablet  Commonly known as:  SYNTHROID, LEVOTHROID  Take 100 mcg by mouth daily before breakfast.     oxyCODONE 5 MG immediate release tablet  Commonly known as:  Oxy IR/ROXICODONE  Take 1 tablet (5 mg total) by mouth every 6 (six) hours as needed for breakthrough pain.     oxyCODONE-acetaminophen 5-325 MG per tablet  Commonly known as:  PERCOCET/ROXICET  Take 1 tablet by mouth every 8 (eight) hours as needed for moderate pain.     potassium chloride SA 20 MEQ tablet  Commonly known as:  K-DUR,KLOR-CON  Take 20 mEq by mouth daily.     promethazine 25 MG tablet  Commonly known as:  PHENERGAN  Take 25 mg by mouth every 6 (six) hours as needed for nausea or vomiting.     REQUIP 0.5 MG tablet  Generic  drug:  rOPINIRole  Take 1 mg by mouth at bedtime.     warfarin 5 MG tablet  Commonly known as:  COUMADIN  Take 5-10 mg by mouth daily. Pt takes 5 mg on Tues., Thurs. Sat. And 10 mg on Mon., Wed., Fri. , and Sun.     zolpidem 10 MG tablet  Commonly known as:  AMBIEN  Take 10 mg by mouth at bedtime as needed for sleep.       No Known Allergies     Follow-up Information   Follow up with NORRIS,STEVEN R, MD. Schedule an appointment as soon as possible for a visit in 2 weeks.   Specialty:  Orthopedic Surgery   Contact information:   [address]  [address] [place] [address] [phone]        The results of significant diagnostics from this hospitalization (including imaging, microbiology, ancillary and laboratory) are listed below for reference.    Significant Diagnostic Studies: No results found.  Microbiology: Recent Results (from the past 240 hour(s))  CULTURE, BLOOD (ROUTINE X 2)     Status: None   Collection Time    06/03/13  5:10 PM      Result Value Ref Range Status   Specimen Description BLOOD RIGHT ARM   Final   Special Requests BOTTLES DRAWN AEROBIC AND ANAEROBIC 5ML   Final   Culture  Setup Time     Final   Value: 06/04/2013 03:48     Performed at [facility]   Culture     Final   Value:        BLOOD CULTURE RECEIVED NO GROWTH TO DATE CULTURE WILL BE HELD FOR 5 DAYS BEFORE ISSUING A FINAL NEGATIVE REPORT     Performed at [facility]   Report Status PENDING   Incomplete  CULTURE, BLOOD (ROUTINE X 2)     Status: None   Collection Time    06/03/13  5:15 PM      Result Value Ref Range Status   Specimen Description BLOOD RIGHT FOREARM   Final   Special Requests BOTTLES DRAWN AEROBIC AND ANAEROBIC 1ML   Final   Culture  Setup Time     Final   Value: 06/04/2013 03:49     Performed at [facility]   Culture     Final   Value:        BLOOD CULTURE RECEIVED NO GROWTH TO DATE CULTURE WILL BE HELD FOR 5 DAYS BEFORE ISSUING A  FINAL NEGATIVE REPORT     Performed at [facility]   Report Status PENDING   Incomplete  BODY FLUID CULTURE     Status: None   Collection Time    06/03/13  6:21 PM      Result Value Ref Range Status   Specimen Description SYNOVIAL   Final   Special Requests Normal   Final   Gram Stain     Final   Value: NO WBC SEEN     NO ORGANISMS SEEN     Performed at [facility]   Culture     Final   Value: NO GROWTH 3 DAYS     Performed at [facility]   Report Status 06/07/2013 FINAL   Final     Labs: Basic Metabolic Panel:  Recent Labs Lab 06/03/13 1710 06/04/13 0403 06/07/13 0657  NA 139 138 135*  K 3.4* 3.4* 3.6*  CL 98 99 95*  CO2 28 25 31   GLUCOSE 77 100* 87  BUN 11 10 11   CREATININE 0.79 0.74 0.82  CALCIUM 10.1 9.2 9.1   Liver Function Tests:  Recent Labs Lab 06/04/13 0403 06/07/13 0657  AST 168* 61*  ALT 132* 133*  ALKPHOS 110 107  BILITOT 0.7 0.2*  PROT 7.5 7.2  ALBUMIN 3.9 3.4*   No results found for this basename: LIPASE, AMYLASE,  in the last 168 hours No results found for this basename: AMMONIA,  in the last 168 hours CBC:  Recent Labs Lab 06/04/13 0403 06/05/13 0401 06/06/13 0230 06/07/13 0657 06/08/13 0450  WBC 4.8 4.8 4.6 4.4 4.2  HGB 11.4* 12.5 11.4* 12.1 11.7*  HCT 34.1* 35.9* 32.8* 34.9* 34.0*  MCV 89.0 88.0 87.7 88.1  87.2  PLT 141* 118* 143* 139* 152   Cardiac Enzymes: No results found for this basename: CKTOTAL, CKMB, CKMBINDEX, TROPONINI,  in the last 168 hours BNP: BNP (last 3 results)  Recent Labs  11/14/12 1615  PROBNP 243.90*   CBG: No results found for this basename: GLUCAP,  in the last 168 hours     Signed:  Velvet Bathe  [facility] 06/08/2013, 2:53 PM

## 2013-06-08 NOTE — Progress Notes (Signed)
ANTICOAGULATION CONSULT NOTE - Follow Up Consult  Pharmacy Consult for argatroban, warfarin Indication: rule out HIT, h/o mechanical MVR   No Known Allergies  Patient Measurements: Height: 5\' 4"  (162.6 cm) Weight: 163 lb (73.936 kg) IBW/kg (Calculated) : 54.7 Heparin Dosing Weight:   Vital Signs: Temp: 97.6 F (36.4 C) (04/20 0520) Temp src: Oral (04/20 0520) BP: 103/70 mmHg (04/20 0520) Pulse Rate: 63 (04/20 0520)  Labs:  Recent Labs  06/06/13 0230  06/06/13 0808 06/07/13 0657 06/08/13 0450 06/08/13 1235  HGB 11.4*  --   --  12.1 11.7*  --   HCT 32.8*  --   --  34.9* 34.0*  --   PLT 143*  --   --  139* 152  --   APTT 100*  < > 86* 86* 85*  --   LABPROT 43.0*  --   --  36.9* 39.6* 26.5*  INR 4.79*  --   --  3.92* 4.30* 2.54*  CREATININE  --   --   --  0.82  --   --   < > = values in this interval not displayed.  Estimated Creatinine Clearance: 86.2 ml/min (by C-G formula based on Cr of 0.82).   Medications:  Scheduled:  . cephALEXin  500 mg Oral 3 times per day  . [START ON 06/10/2013] cyanocobalamin  1,000 mcg Intramuscular Q30 days  . docusate sodium  100 mg Oral BID  . furosemide  40 mg Oral Daily  . gabapentin  600 mg Oral BID  . hydroxychloroquine  200 mg Oral BID  . levofloxacin  500 mg Oral Daily  . levothyroxine  100 mcg Oral QAC breakfast  . pantoprazole  80 mg Oral Daily  . polyethylene glycol  17 g Oral Daily  . potassium chloride SA  20 mEq Oral Daily  . rOPINIRole  1 mg Oral QHS  . Warfarin - Pharmacist Dosing Inpatient   Does not apply q1800   Infusions:    Goal INR: 2.5 - 3.5   Assessment:  INR therapeutic six hours after stopping argatroban.  Patient states she was previously extremely stable on warfarin 10mg  daily except 5mg  Tues, Thurs, and Sat.   Her  INR became supratherapeutic after an outpatient rituximab infusion and then was subtherapeutic after her outpatient warfarin was held for two days and then resumed.  Patient states  the anticoagulation clinic in [place] can resume seeing her in the office once she notifies them of her discharge date.  Recommend: 1.  Resume warfarin 10 mg daily except 5 mg on Tuesday, Thursday, and Saturday. 2.  F/U INR in [facility] on either Wed 4/22 or Thurs 4/23.   Clayburn Pert, PharmD, BCPS Pager: [phone] 06/08/2013  1:19 PM

## 2013-06-09 ENCOUNTER — Encounter (HOSPITAL_COMMUNITY)
Admission: RE | Admit: 2013-06-09 | Discharge: 2013-06-09 | Disposition: A | Discharge status: Home / Self Care | Payer: BC Managed Care – PPO | Source: Ambulatory Visit | Attending: Rheumatology | Admitting: Rheumatology

## 2013-06-09 LAB — HEPARIN INDUCED THROMBOCYTOPENIA PNL
HEPARIN INDUCED PLT AB: NEGATIVE
Patient O.D.: 0.119
UFH High Dose UFH H: 4 % Release
UFH LOW DOSE 0.1 IU/ML: 2 %
UFH Low Dose 0.5 IU/mL: 3 % Release
UFH SRA Result: NEGATIVE

## 2013-06-10 ENCOUNTER — Ambulatory Visit (INDEPENDENT_AMBULATORY_CARE_PROVIDER_SITE_OTHER): Payer: BC Managed Care – PPO | Admitting: *Deleted

## 2013-06-10 DIAGNOSIS — I059 Rheumatic mitral valve disease, unspecified: Secondary | ICD-10-CM | POA: Diagnosis not present

## 2013-06-10 DIAGNOSIS — Z7901 Long term (current) use of anticoagulants: Secondary | ICD-10-CM

## 2013-06-10 DIAGNOSIS — Z954 Presence of other heart-valve replacement: Secondary | ICD-10-CM

## 2013-06-10 DIAGNOSIS — Z5181 Encounter for therapeutic drug level monitoring: Secondary | ICD-10-CM | POA: Diagnosis not present

## 2013-06-10 LAB — POCT INR: INR: 3.3

## 2013-06-10 LAB — CULTURE, BLOOD (ROUTINE X 2)
Culture: NO GROWTH
Culture: NO GROWTH

## 2013-06-15 ENCOUNTER — Ambulatory Visit (INDEPENDENT_AMBULATORY_CARE_PROVIDER_SITE_OTHER): Payer: BC Managed Care – PPO | Admitting: *Deleted

## 2013-06-15 DIAGNOSIS — Z954 Presence of other heart-valve replacement: Secondary | ICD-10-CM | POA: Diagnosis not present

## 2013-06-15 DIAGNOSIS — Z7901 Long term (current) use of anticoagulants: Secondary | ICD-10-CM | POA: Diagnosis not present

## 2013-06-15 DIAGNOSIS — I059 Rheumatic mitral valve disease, unspecified: Secondary | ICD-10-CM

## 2013-06-15 DIAGNOSIS — Z5181 Encounter for therapeutic drug level monitoring: Secondary | ICD-10-CM

## 2013-06-15 LAB — POCT INR: INR: 3.2

## 2013-06-22 ENCOUNTER — Encounter (HOSPITAL_COMMUNITY): Payer: BC Managed Care – PPO | Attending: Hematology and Oncology

## 2013-06-22 DIAGNOSIS — D591 Other autoimmune hemolytic anemias: Secondary | ICD-10-CM

## 2013-06-22 DIAGNOSIS — E538 Deficiency of other specified B group vitamins: Secondary | ICD-10-CM

## 2013-06-22 DIAGNOSIS — C50919 Malignant neoplasm of unspecified site of unspecified female breast: Secondary | ICD-10-CM

## 2013-06-22 DIAGNOSIS — M329 Systemic lupus erythematosus, unspecified: Secondary | ICD-10-CM

## 2013-06-22 DIAGNOSIS — M064 Inflammatory polyarthropathy: Secondary | ICD-10-CM | POA: Diagnosis not present

## 2013-06-22 DIAGNOSIS — G609 Hereditary and idiopathic neuropathy, unspecified: Secondary | ICD-10-CM | POA: Diagnosis not present

## 2013-06-22 DIAGNOSIS — I7300 Raynaud's syndrome without gangrene: Secondary | ICD-10-CM | POA: Diagnosis not present

## 2013-06-22 DIAGNOSIS — D594 Other nonautoimmune hemolytic anemias: Secondary | ICD-10-CM

## 2013-06-22 LAB — CBC WITH DIFFERENTIAL/PLATELET
BASOS ABS: 0 10*3/uL (ref 0.0–0.1)
Basophils Relative: 3 % — ABNORMAL HIGH (ref 0–1)
EOS ABS: 0.1 10*3/uL (ref 0.0–0.7)
Eosinophils Relative: 9 % — ABNORMAL HIGH (ref 0–5)
HCT: 33 % — ABNORMAL LOW (ref 36.0–46.0)
Hemoglobin: 11.3 g/dL — ABNORMAL LOW (ref 12.0–15.0)
Lymphocytes Relative: 30 % (ref 12–46)
Lymphs Abs: 0.5 10*3/uL — ABNORMAL LOW (ref 0.7–4.0)
MCH: 30.2 pg (ref 26.0–34.0)
MCHC: 34.2 g/dL (ref 30.0–36.0)
MCV: 88.2 fL (ref 78.0–100.0)
MONO ABS: 0.2 10*3/uL (ref 0.1–1.0)
MONOS PCT: 14 % — AB (ref 3–12)
NEUTROS ABS: 0.7 10*3/uL — AB (ref 1.7–7.7)
Neutrophils Relative %: 44 % (ref 43–77)
PLATELETS: 163 10*3/uL (ref 150–400)
RBC: 3.74 MIL/uL — ABNORMAL LOW (ref 3.87–5.11)
RDW: 14 % (ref 11.5–15.5)
WBC: 1.5 10*3/uL — AB (ref 4.0–10.5)

## 2013-06-22 LAB — COMPREHENSIVE METABOLIC PANEL
ALBUMIN: 3.5 g/dL (ref 3.5–5.2)
ALT: 15 U/L (ref 0–35)
AST: 17 U/L (ref 0–37)
Alkaline Phosphatase: 65 U/L (ref 39–117)
BUN: 11 mg/dL (ref 6–23)
CALCIUM: 9.1 mg/dL (ref 8.4–10.5)
CO2: 27 mEq/L (ref 19–32)
CREATININE: 0.77 mg/dL (ref 0.50–1.10)
Chloride: 103 mEq/L (ref 96–112)
GFR calc Af Amer: >90 mL/min (ref >90)
GFR calc non Af Amer: >90 mL/min (ref >90)
Glucose, Bld: 82 mg/dL (ref 70–99)
Potassium: 3.4 mEq/L — ABNORMAL LOW (ref 3.7–5.3)
Sodium: 140 mEq/L (ref 137–147)
TOTAL PROTEIN: 7.2 g/dL (ref 6.0–8.3)
Total Bilirubin: 0.3 mg/dL (ref 0.3–1.2)

## 2013-06-22 LAB — LACTATE DEHYDROGENASE: LDH: 292 U/L — ABNORMAL HIGH (ref 94–250)

## 2013-06-22 LAB — SEDIMENTATION RATE: Sed Rate: 33 mm/hr — ABNORMAL HIGH (ref 0–22)

## 2013-06-22 LAB — VITAMIN B12: VITAMIN B 12: 740 pg/mL (ref 211–911)

## 2013-06-22 NOTE — Progress Notes (Signed)
Labs drawn today for cbc/diff,ldh,cmp,ca2729,cea,b12

## 2013-06-23 ENCOUNTER — Other Ambulatory Visit: Payer: Self-pay | Admitting: Gastroenterology

## 2013-06-23 ENCOUNTER — Ambulatory Visit (INDEPENDENT_AMBULATORY_CARE_PROVIDER_SITE_OTHER): Payer: BC Managed Care – PPO | Admitting: Gastroenterology

## 2013-06-23 ENCOUNTER — Encounter: Payer: Self-pay | Admitting: Gastroenterology

## 2013-06-23 VITALS — BP 99/69 | HR 71 | Temp 97.6°F | Ht 64.0 in | Wt 167.2 lb

## 2013-06-23 DIAGNOSIS — R110 Nausea: Secondary | ICD-10-CM

## 2013-06-23 DIAGNOSIS — K3189 Other diseases of stomach and duodenum: Secondary | ICD-10-CM | POA: Diagnosis not present

## 2013-06-23 DIAGNOSIS — R140 Abdominal distension (gaseous): Secondary | ICD-10-CM

## 2013-06-23 DIAGNOSIS — R1013 Epigastric pain: Secondary | ICD-10-CM | POA: Diagnosis not present

## 2013-06-23 LAB — CANCER ANTIGEN 27.29: CA 27.29: 21 U/mL (ref 0–39)

## 2013-06-23 LAB — CEA: CEA: 0.8 ng/mL (ref 0.0–5.0)

## 2013-06-23 NOTE — Patient Instructions (Signed)
I have ordered a gastric emptying study. We will call with the results!  Continue to take Dexilant each morning!  Further recommendations to follow

## 2013-06-23 NOTE — Progress Notes (Signed)
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 female returns for  regular  visit for followup of left-sided breast cancer, triple negative, 3.8 cm in size, left upper quadrant mass with no obvious adenopathy. She took 2 cycles of FEC followed by mastectomy on 04/20/2010. This showed no disease in the lymph nodes but still she had a 1.6 cm cancer, grade 3. Margins were clear. Your receptors negative, PR receptors negative, HER-2/neu negative. Ki-67 marker high at 92%. LV I was not identified. She was then treated with Taxotere and carboplatin for 4 cycles with difficulty tolerating the chemotherapy. She did receive a total 6 cycles 2 before surgery and 4 after.      BREAST CANCER   12/28/2009 Initial Diagnosis Left needle biopsy- invasive ductal ca, grade 3, triple negative with Ki-67 92%   01/09/2010 Survivorship BRCA1 and BRCA2 negative   01/31/2010 - 03/03/2010 Chemotherapy FEC x 2 cycles, neoadjuvantly   04/20/2010 Surgery Left simple mastectomy with superior flap excision showing a 1.6 cm invasive ductal carcinoma, grade 3, with negative resection margins, no LVI, 0/3 lymph nodes.  Triple negative with Ki-67 of 92%.   06/05/2010 - 08/28/2010 Chemotherapy TC x 4 cycles    I personally reviewed and went over laboratory results with the patient.  The results are noted within this dictation.  I personally reviewed and went over radiographic studies with the patient.  The results are noted within this dictation.  CT of CAP in December 2014 demonstrates the following: Unremarkable CT examination of the chest. No acute pulmonary  findings or mass lesions.  No CT findings for metastatic disease involving the  chest or chest  wall. No evidence of bowel obstruction. Normal appendix.  No evidence of metastatic disease in the abdomen/pelvis.  No CT findings to account for the patient's abdominal pain.  She is doing well.  She reports that she received IVIG in the distant past prior to chemotherapy administration for low immunoglobulins.  That information is in her paper chart as it was performed prior [facility] go-live date.  She denies any recurrent infections, but she did have a septic joint of the right knee requiring hospitalization.  She otherwise has been doing extremely well.  She questions whether her immunoglobulins need tested again and I will do this with her next lab test.  Otherwise, she denies any oncology issues and ROS questioning is negative.   Past Medical History  Diagnosis Date  . SLE (systemic lupus erythematosus)   . BRCA2 negative   . BRCA1 negative   . 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 B12 deficiency   . Mitral valve disease, rheumatic     St. Jude prosthesis  . Raynaud disease   . Fibromyalgia   . Anxiety   . Peripheral neuropathy   . PONV (postoperative nausea and vomiting)   . Anticoagulation goal of INR 2.5 to 3.5   . Breast cancer 2011    Left side  . Autoimmune hepatitis     states per her rheumatologist, transaminases elevated but normalized after drug removed for   . 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     has No Known Allergies.  Ms.  Moehle does not currently have medications on file.  Past Surgical History  Procedure Laterality Date  . Mitral valve replacement    . Cholecystectomy    . Mastectomy    . Cardiac valve replacement    . Breast surgery    . Endometrial ablation  2008    She no longer has menses  . Insertion expander left breast  11/16/10  . Tissue expander placement  02/14/2011    Procedure: TISSUE EXPANDER;  Surgeon: Macon Large;  Location: [facility];  Service: Plastics;  Laterality: Bilateral;  Left Breast Remove Tissue Expander Placement of Implant Breast Reconstruction  . Mastopexy  02/14/2011    Procedure: MASTOPEXY;  Surgeon: Macon Large;  Location: [facility];  Service: Plastics;  Laterality: Bilateral;  Right Breast Reduction   . Esophagogastroduodenoscopy N/A 03/25/2013    Dr. Raliegh Scarlet reflux esophagitis-likely source of patient's symptoms (patulous EG junction). Hiatal hernia otherwise normal    Denies any headaches, dizziness, double vision, fevers, chills, night sweats, nausea, vomiting, diarrhea, constipation, chest pain, heart palpitations, shortness of breath, blood in stool, black tarry stool, urinary pain, urinary burning, urinary frequency, hematuria.   PHYSICAL EXAMINATION  ECOG PERFORMANCE STATUS: 0 - Asymptomatic  Filed Vitals:   06/25/13 1325  BP: 106/63  Pulse: 69  Temp: 97.9 F (36.6 C)  Resp: 18    GENERAL:alert, no distress, well nourished, well developed, comfortable, cooperative and smiling SKIN: skin color, texture, turgor are normal, no rashes or significant lesions HEAD: Normocephalic, No masses, lesions, tenderness or abnormalities EYES: normal, PERRLA, EOMI, Conjunctiva are pink and non-injected EARS: External ears normal OROPHARYNX:mucous membranes are moist  NECK: supple, no adenopathy, thyroid normal size, non-tender, without nodularity, no stridor, non-tender, trachea midline LYMPH:  no palpable lymphadenopathy BREAST:patient declines to have breast exam  as she just had one yesterday by her Gyn LUNGS: clear to auscultation and percussion HEART: regular rate & rhythm, no gallops and systolic click ABDOMEN:abdomen soft and normal bowel sounds BACK: Back symmetric, no curvature. EXTREMITIES:less then 2 second capillary refill, no joint deformities, effusion, or inflammation, no edema, no skin discoloration, no clubbing, no cyanosis  NEURO: alert & oriented x 3 with fluent speech, no focal motor/sensory deficits, gait normal   LABORATORY DATA: CBC    Component Value Date/Time   WBC 1.5* 06/22/2013 1202   RBC 3.74* 06/22/2013 1202   RBC 3.89 12/22/2012 0946   HGB 11.3* 06/22/2013 1202   HCT 33.0* 06/22/2013 1202   PLT 163 06/22/2013 1202   MCV 88.2 06/22/2013 1202   MCH 30.2 06/22/2013 1202   MCHC 34.2 06/22/2013 1202   RDW 14.0 06/22/2013 1202   LYMPHSABS 0.5* 06/22/2013 1202   MONOABS 0.2 06/22/2013 1202   EOSABS 0.1 06/22/2013 1202   BASOSABS 0.0 06/22/2013 1202      Chemistry      Component Value Date/Time   NA 140 06/22/2013 1202   K 3.4* 06/22/2013 1202   CL 103 06/22/2013 1202   CO2 27 06/22/2013 1202   BUN 11 06/22/2013 1202   CREATININE 0.77 06/22/2013 1202   CREATININE 0.88 11/14/2012  1615      Component Value Date/Time   CALCIUM 9.1 06/22/2013 1202   ALKPHOS 65 06/22/2013 1202   AST 17 06/22/2013 1202   ALT 15 06/22/2013 1202   BILITOT 0.3 06/22/2013 1202     Lab Results  Component Value Date   LABCA2 21 06/22/2013     RADIOGRAPHIC STUDIES:  02/10/2013  CLINICAL DATA: Shortness of breath, abdominal pain/ swelling, prior  cholecystectomy, left breast cancer  EXAM:  CT ABDOMEN AND PELVIS WITH CONTRAST  TECHNIQUE:  Multidetector CT imaging of the abdomen and pelvis was performed  using the standard protocol following bolus administration of  intravenous contrast.  CONTRAST: 124m OMNIPAQUE IOHEXOL 300 MG/ML SOLN  COMPARISON: 06/26/2011  FINDINGS:  Lung bases are essentially clear. Prior left breast reconstruction.  Tiny hiatal hernia.  Liver,  spleen, pancreas, and adrenal glands are within normal  limits.  Status post cholecystectomy. No intrahepatic or extrahepatic ductal  dilatation.  No evidence of bowel obstruction. Normal appendix.  Atherosclerotic calcifications of the abdominal aorta and branch  vessels.  No abdominopelvic ascites.  No suspicious abdominopelvic lymphadenopathy.  Uterus and bilateral ovaries are unremarkable.  Bladder is within normal limits.  Mild degenerative changes at L2-3. No focal osseous lesions.  IMPRESSION:  No evidence of bowel obstruction. Normal appendix.  No evidence of metastatic disease in the abdomen/pelvis.  No CT findings to account for the patient's abdominal pain.  Electronically Signed  By: SJulian HyM.D.  On: 02/10/2013 13:12    01/30/2013  CLINICAL DATA: Shortness of breath. History of lupus. History of  breast cancer. 03/17/2010.  EXAM:  CT CHEST WITH CONTRAST  TECHNIQUE:  Multidetector CT imaging of the chest was performed during  intravenous contrast administration.  CONTRAST: 832mOMNIPAQUE IOHEXOL 300 MG/ML SOLN  COMPARISON: The chest wall is unremarkable. They a left breast  prosthesis is noted. No chest wall mass, supraclavicular or axillary  lymphadenopathy. There is a cluster of small subpectoral lymph nodes  on the right side which appears stable. The thyroid gland appears  normal. The bony thorax is intact. No destructive bone lesions or  spinal canal compromise  The heart is normal in size. No pericardial effusion. There is an  old epicardial pacer wire noted on the right side. No mediastinal or  hilar mass or adenopathy. The aorta is normal in caliber. The branch  vessels are patent. Three-vessel coronary artery calcifications are  noted. A a prosthetic mitral valve is noted. The esophagus is  unremarkable.  Examination of the lung parenchyma demonstrate no acute pulmonary  findings. Mild dependent atelectasis/edema but no infiltrates,    effusions or pulmonary lesions. No interstitial lung disease or  bronchiectasis. No mass lesions or worrisome pulmonary nodules. The  tracheobronchial tree is unremarkable.  The upper abdomen is unremarkable. The spleen is borderline enlarged  measuring 13 x 10 x 8 cm. The liver is unremarkable. The visualized  pancreas and adrenal glands are unremarkable.  FINDINGS:  Unremarkable CT examination of the chest. No acute pulmonary  findings or mass lesions.  No CT findings for metastatic disease involving the chest or chest  wall.  Electronically Signed  By: MaKalman Jewels.D.  On: 01/30/2013 16:09     ASSESSMENT:  1. Left-sided breast cancer, triple negative, 3.8 cm in size, left upper quadrant mass with no obvious adenopathy. She took 2 cycles of FEC followed by mastectomy on 04/20/2010. This showed no disease in the lymph nodes but still she had a 1.6 cm cancer, grade  3. Margins were clear. Your receptors negative, PR receptors negative, HER-2/neu negative. Ki-67 marker high at 92%. LV I was not identified. She was then treated with Taxotere and carboplatin for 4 cycles with difficulty tolerating the chemotherapy. She did receive a total 6 cycles 2 before surgery and 4 after.  2. SLE on Plaquenil and methotrexate  3. 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  4. Septic episodes after cycles 1 and cycle 2 of chemotherapy  5. Mitral valve replacement with a history of CHF still on lifelong anticoagulation. She has a history of rheumatic heart disease as a child  6.  BRCA1 and BRCA2 negative  7. Hypothyroidism, on treatment. 8. B12 deficiency on monthly B12 shots  Patient Active Problem List   Diagnosis Date Noted  . BREAST CANCER 02/24/2010    Priority: High  . Nausea alone 06/23/2013  . Septic prepatellar bursitis of right knee 06/03/2013  . Encounter for therapeutic  drug monitoring 03/18/2013  . Dyspepsia 03/12/2013  . Loose stools 03/12/2013  . Dyspnea 11/14/2012  . Anticoagulation goal of INR 2.5 to 3.5   . Mitral valve replaced   . Insomnia 12/19/2010  . Peripheral neuropathy   . B12 deficiency 08/21/2010  . 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 BRCA1 negative 08/16/2010  . BRCA2 negative 08/16/2010  . Encounter for long-term (current) use of anticoagulants 05/17/2010  . Mitral valve disorders 11/10/2009  . Systemic lupus erythematosus 11/10/2009     PLAN:  1. I personally reviewed and went over laboratory results with the patient.  The results are noted within this dictation. 2. I personally reviewed and went over radiographic studies with the patient.  The results are noted within this dictation.   3. Chart reviewed 4. Follow-up with specialists and PCP as directed 5. Next screening mammogram is due in November 2015.  6. Labs in 6 months: CBC diff, CMET, CEA, CA 27.29, IgG, IgA, and IgM 7. Oncology history updated 8. Return in 6 months for follow-up   THERAPY PLAN:  NCCN guidelines recommends the following surveillance for invasive breast cancer:  A. History and Physical exam every 4-6 months for 5 years and then every 12 months.  B. Mammography every 12 months  C. Women on Tamoxifen: annual gynecologic assessment every 12 months if uterus is present.  D. Women on aromatase inhibitor or who experience ovarian failure secondary to treatment should have monitoring of bone health with a bone mineral density determination at baseline and periodically thereafter.  E. Assess and encourage adherence to adjuvant endocrine therapy.  F. Evidence suggests that active lifestyle and achieving and maintaining an ideal body weight (20-25 BMI) may lead to optimal breast cancer outcomes.  All questions were answered. The patient knows to call the clinic with any problems, questions or concerns. We can certainly see  the patient much sooner if necessary.  Patient and plan discussed with Dr. Farrel Gobble and he is in agreement with the aforementioned.   [facility] 06/25/2013

## 2013-06-23 NOTE — Progress Notes (Signed)
Referring Provider: Halford Chessman, MD Primary Care Physician:  Purvis Kilts, MD Primary GI: Dr. Gala Romney   Chief Complaint  Patient presents with  . Follow-up    HPI:   [name] presents today in follow-up after EGD for dyspepsia and nausea. Erosive esophagitis noted, otherwise normal. Occasional bloating but not as bad as it was. Lost from 182 to 167, purposefully. No melena, hematochezia. Some upper abdominal discomfort at times. Sometimes associated with eating, sometimes late in evening. Nausea, intermittently, sometimes in evening. Sometimes worse in morning. Not overeating. Phenergan intermittently. 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 Feels bloated and tight. BMs at baseline. No diarrhea. Taking probiotic.    Past Medical History  Diagnosis Date  . SLE (systemic lupus erythematosus)   . BRCA2 negative   . BRCA1 negative   . 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 B12 deficiency   . Mitral valve disease, rheumatic     St. Jude prosthesis  . Raynaud disease   . Fibromyalgia   . Anxiety   . Peripheral neuropathy   . PONV (postoperative nausea and vomiting)   . Anticoagulation goal of INR 2.5 to 3.5   . Breast cancer 2011    Left side  . Autoimmune hepatitis     states per her rheumatologist, transaminases elevated but normalized after drug removed for     Past Surgical History  Procedure Laterality Date  . Mitral valve replacement    . Cholecystectomy    . Mastectomy    . Cardiac valve replacement    . Breast surgery    . Endometrial ablation  2008    She no longer has menses  . Insertion expander left breast  11/16/10  . Tissue expander placement  02/14/2011    Procedure: TISSUE EXPANDER;  Surgeon: Macon Large;  Location: [facility];  Service: Plastics;  Laterality: Bilateral;  Left Breast Remove Tissue Expander Placement of Implant Breast Reconstruction  . Mastopexy  02/14/2011    Procedure: MASTOPEXY;  Surgeon: Macon Large;  Location: [facility];  Service: Plastics;  Laterality: Bilateral;  Right Breast Reduction   . Esophagogastroduodenoscopy N/A 03/25/2013    Dr. Raliegh Scarlet reflux esophagitis-likely source of patient's symptoms (patulous EG junction). Hiatal hernia otherwise normal    Current Outpatient Prescriptions  Medication Sig Dispense Refill  . cyanocobalamin (,VITAMIN B-12,) 1000 MCG/ML injection Inject 1 mL (1,000 mcg total) into the muscle every 30 (thirty) days.  10 mL  prn  . dexlansoprazole (DEXILANT) 60 MG capsule Take 1 capsule (60 mg total) by mouth daily.  31 capsule  11  . furosemide (LASIX) 40 MG tablet Take 1 tablet (40 mg total) by mouth daily.  90 tablet  3  . gabapentin (NEURONTIN) 600 MG tablet Take 600 mg by mouth 2 (two) times daily.       . hydroxychloroquine (PLAQUENIL) 200 MG tablet Take 200 mg by mouth 2 (two) times daily.        Marland Kitchen ibuprofen (ADVIL,MOTRIN) 200 MG tablet Take 800 mg by mouth every 6 (six) hours as needed for headache.      . levothyroxine (SYNTHROID, LEVOTHROID) 100 MCG tablet Take 100 mcg by mouth daily before breakfast.      . potassium chloride SA (K-DUR,KLOR-CON) 20 MEQ tablet Take 20 mEq by mouth daily.      . promethazine (PHENERGAN) 25 MG tablet Take 25 mg by mouth every 6 (six) hours as needed for nausea or  vomiting.      Marland Kitchen rOPINIRole (REQUIP) 0.5 MG tablet Take 1 mg by mouth at bedtime.       Marland Kitchen warfarin (COUMADIN) 5 MG tablet Take 5-10 mg by mouth daily. Pt takes 5 mg on Tues., Thurs. Sat. And 10 mg on Mon., Wed., Fri. , and Sun.      Marland Kitchen zolpidem (AMBIEN) 10 MG tablet Take 10 mg by mouth at bedtime as needed for sleep.        No current facility-administered medications for this visit.    Allergies as of 06/23/2013  . (No Known Allergies)    Family History  Problem Relation Age of Onset  . Colon cancer Neg Hx     History   Social History  . Marital Status: Married    Spouse Name: N/A    Number of Children: 2  . Years of Education: N/A    Social History Main Topics  . Smoking status: Former Smoker -- 1.00 packs/day for 15 years    Types: Cigarettes    Quit date: 02/19/2006  . Smokeless tobacco: Never Used  . Alcohol Use: Yes     Comment: Occasional  . Drug Use: No  . Sexual Activity: No   Other Topics Concern  . None   Social History Narrative  . None    Review of Systems: As mentioned in HPI  Physical Exam: BP 99/69  Pulse 71  Temp(Src) 97.6 F (36.4 C) (Oral)  Ht 5' 4"  (1.626 m)  Wt 167 lb 3.2 oz (75.841 kg)  BMI 28.69 kg/m2 General:   Alert and oriented. No distress noted. Pleasant and cooperative.  Head:  Normocephalic and atraumatic. Eyes:  Conjuctiva clear without scleral icterus. Abdomen:  +BS, soft, mild TTP epigastric region and non-distended. No rebound or guarding. No HSM or masses noted. Msk:  Symmetrical without gross deformities. Normal posture. Extremities:  Without edema. Neurologic:  Alert and  oriented x4;  grossly normal neurologically. Skin:  Intact without significant lesions or rashes. Psych:  Alert and cooperative. Normal mood and affect.

## 2013-06-23 NOTE — Progress Notes (Signed)
cc'd to pcp 

## 2013-06-23 NOTE — Assessment & Plan Note (Signed)
With EGD and CT on file, no evidence of gastritis or PUD. Improvement with Dexilant and weight loss noted. Still complains of persisting symptoms, bloating, and nausea. Will order GES to assess for delayed gastric emptying, although that would not help explain intermittent abdominal pain necessarily. May have non-ulcer dyspepsia, doubt SBBO. GES for now, with further recommendations to follow.

## 2013-06-23 NOTE — Assessment & Plan Note (Signed)
GES as ordered.

## 2013-06-24 ENCOUNTER — Other Ambulatory Visit (HOSPITAL_COMMUNITY)
Admission: RE | Admit: 2013-06-24 | Discharge: 2013-06-24 | Disposition: A | Discharge status: Home / Self Care | Payer: BC Managed Care – PPO | Source: Ambulatory Visit | Attending: Adult Health | Admitting: Adult Health

## 2013-06-24 ENCOUNTER — Ambulatory Visit (INDEPENDENT_AMBULATORY_CARE_PROVIDER_SITE_OTHER): Payer: BC Managed Care – PPO | Admitting: Adult Health

## 2013-06-24 ENCOUNTER — Encounter: Payer: Self-pay | Admitting: Adult Health

## 2013-06-24 VITALS — BP 102/70 | HR 74 | Ht 64.0 in | Wt 168.0 lb

## 2013-06-24 DIAGNOSIS — Z124 Encounter for screening for malignant neoplasm of cervix: Secondary | ICD-10-CM | POA: Insufficient documentation

## 2013-06-24 DIAGNOSIS — Z01419 Encounter for gynecological examination (general) (routine) without abnormal findings: Secondary | ICD-10-CM

## 2013-06-24 DIAGNOSIS — Z1151 Encounter for screening for human papillomavirus (HPV): Secondary | ICD-10-CM | POA: Insufficient documentation

## 2013-06-24 DIAGNOSIS — C50919 Malignant neoplasm of unspecified site of unspecified female breast: Secondary | ICD-10-CM | POA: Diagnosis not present

## 2013-06-24 DIAGNOSIS — R87810 Cervical high risk human papillomavirus (HPV) DNA test positive: Secondary | ICD-10-CM | POA: Insufficient documentation

## 2013-06-24 DIAGNOSIS — Z853 Personal history of malignant neoplasm of breast: Secondary | ICD-10-CM

## 2013-06-24 DIAGNOSIS — Z1212 Encounter for screening for malignant neoplasm of rectum: Secondary | ICD-10-CM | POA: Diagnosis not present

## 2013-06-24 NOTE — Patient Instructions (Signed)
Physical in 1 year Pap in 3 years if negative HPV Mammogram yearly  Perimenopause Perimenopause is the time when your body begins to move into the menopause (no menstrual period for 12 straight months). It is a natural process. Perimenopause can begin 2 8 years before the menopause and usually lasts for 1 year after the menopause. During this time, your ovaries may or may not produce an egg. The ovaries vary in their production of estrogen and progesterone hormones each month. This can cause irregular menstrual periods, difficulty getting pregnant, vaginal bleeding between periods, and uncomfortable symptoms. CAUSES  Irregular production of the ovarian hormones, estrogen and progesterone, and not ovulating every month.  Other causes include:  Tumor of the pituitary gland in the brain.  Medical disease that affects the ovaries.  Radiation treatment.  Chemotherapy.  Unknown causes.  Heavy smoking and excessive alcohol intake can bring on perimenopause sooner. SIGNS AND SYMPTOMS   Hot flashes.  Night sweats.  Irregular menstrual periods.  Decreased sex drive.  Vaginal dryness.  Headaches.  Mood swings.  Depression.  Memory problems.  Irritability.  Tiredness.  Weight gain.  Trouble getting pregnant.  The beginning of losing bone cells (osteoporosis).  The beginning of hardening of the arteries (atherosclerosis). DIAGNOSIS  Your health care provider will make a diagnosis by analyzing your age, menstrual history, and symptoms. He or she will do a physical exam and note any changes in your body, especially your female organs. Female hormone tests may or may not be helpful depending on the amount of female hormones you produce and when you produce them. However, other hormone tests may be helpful to rule out other problems. TREATMENT  In some cases, no treatment is needed. The decision on whether treatment is necessary during the perimenopause should be made by you  and your health care provider based on how the symptoms are affecting you and your lifestyle. Various treatments are available, such as:  Treating individual symptoms with a specific medicine for that symptom.  Herbal medicines that can help specific symptoms.  Counseling.  Group therapy. HOME CARE INSTRUCTIONS   Keep track of your menstrual periods (when they occur, how heavy they are, how long between periods, and how long they last) as well as your symptoms and when they started.  Only take over-the-counter or prescription medicines as directed by your health care provider.  Sleep and rest.  Exercise.  Eat a diet that contains calcium (good for your bones) and soy (acts like the estrogen hormone).  Do not smoke.  Avoid alcoholic beverages.  Take vitamin supplements as recommended by your health care provider. Taking vitamin E may help in certain cases.  Take calcium and vitamin D supplements to help prevent bone loss.  Group therapy is sometimes helpful.  Acupuncture may help in some cases. SEEK MEDICAL CARE IF:   You have questions about any symptoms you are having.  You need a referral to a specialist (gynecologist, psychiatrist, or psychologist). SEEK IMMEDIATE MEDICAL CARE IF:   You have vaginal bleeding.  Your period lasts longer than 8 days.  Your periods are recurring sooner than 21 days.  You have bleeding after intercourse.  You have severe depression.  You have pain when you urinate.  You have severe headaches.  You have vision problems. Document Released: 03/15/2004 Document Revised: 11/26/2012 Document Reviewed: 09/04/2012 [facility] Patient Information 2014 [place], [place]. Colonoscopy at 60

## 2013-06-24 NOTE — Progress Notes (Signed)
Patient ID: [name], female   DOB: 05-Feb-1971, 43 y.o.   MRN: [id number] History of Present Illness: [name] is a 43 year old white female,maried in for a pap and physical.   Current Medications, Allergies, Past Medical History, Past Surgical History, Family History and Social History were reviewed in [employer] record.   Past Medical History  Diagnosis Date  . SLE (systemic lupus erythematosus)   . BRCA2 negative   . BRCA1 negative   . 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 B12 deficiency   . Mitral valve disease, rheumatic     St. Jude prosthesis  . Raynaud disease   . Fibromyalgia   . Anxiety   . Peripheral neuropathy   . PONV (postoperative nausea and vomiting)   . Anticoagulation goal of INR 2.5 to 3.5   . Breast cancer 2011    Left side  . Autoimmune hepatitis     states per her rheumatologist, transaminases elevated but normalized after drug removed for    Past Surgical History  Procedure Laterality Date  . Mitral valve replacement    . Cholecystectomy    . Mastectomy    . Cardiac valve replacement    . Breast surgery    . Endometrial ablation  2008    She no longer has menses  . Insertion expander left breast  11/16/10  . Tissue expander placement  02/14/2011    Procedure: TISSUE EXPANDER;  Surgeon: Macon Large;  Location: [facility];  Service: Plastics;  Laterality: Bilateral;  Left Breast Remove Tissue Expander Placement of Implant Breast Reconstruction  . Mastopexy  02/14/2011    Procedure: MASTOPEXY;  Surgeon: Macon Large;  Location: [place];  Service: Plastics;  Laterality: Bilateral;  Right Breast Reduction   . Esophagogastroduodenoscopy N/A 03/25/2013    Dr. Raliegh Scarlet reflux esophagitis-likely source of patient's symptoms (patulous EG junction). Hiatal hernia otherwise normal  Current outpatient prescriptions:cyanocobalamin (,VITAMIN B-12,) 1000 MCG/ML injection, Inject 1 mL (1,000 mcg total) into the  muscle every 30 (thirty) days., Disp: 10 mL, Rfl: prn;  dexlansoprazole (DEXILANT) 60 MG capsule, Take 1 capsule (60 mg total) by mouth daily., Disp: 31 capsule, Rfl: 11;  furosemide (LASIX) 40 MG tablet, Take 1 tablet (40 mg total) by mouth daily., Disp: 90 tablet, Rfl: 3 gabapentin (NEURONTIN) 600 MG tablet, Take 600 mg by mouth 2 (two) times daily. , Disp: , Rfl: ;  hydroxychloroquine (PLAQUENIL) 200 MG tablet, Take 200 mg by mouth 2 (two) times daily.  , Disp: , Rfl: ;  ibuprofen (ADVIL,MOTRIN) 200 MG tablet, Take 800 mg by mouth every 6 (six) hours as needed for headache., Disp: , Rfl: ;  levothyroxine (SYNTHROID, LEVOTHROID) 100 MCG tablet, Take 100 mcg by mouth daily before breakfast., Disp: , Rfl:  potassium chloride SA (K-DUR,KLOR-CON) 20 MEQ tablet, Take 20 mEq by mouth daily., Disp: , Rfl: ;  promethazine (PHENERGAN) 25 MG tablet, Take 25 mg by mouth every 6 (six) hours as needed for nausea or vomiting., Disp: , Rfl: ;  rOPINIRole (REQUIP) 0.5 MG tablet, Take 1 mg by mouth at bedtime. , Disp: , Rfl:  warfarin (COUMADIN) 5 MG tablet, Take 5-10 mg by mouth daily. Pt takes 5 mg on Tues., Thurs. Sat. And 10 mg on Mon., Wed., Fri. , and Sun., Disp: , Rfl: ;  zolpidem (AMBIEN) 10 MG tablet, Take 10 mg by mouth at bedtime as needed for sleep. , Disp: , Rfl:   Review of Systems:  Patient denies any headaches, blurred vision, shortness of breath, chest pain, abdominal pain, problems with bowel movements, urination, or intercourse. No currently having sex,has body aches on meds,no mood changes,denies nay hot flashes and ablation is working.    Physical Exam:BP 102/70  Pulse 74  Ht _0  (1.626 m)  Wt 168 lb (76.204 kg)  BMI 28.82 kg/m2 General:  Well developed, well nourished, no acute distress Skin:  Warm and dry Neck:  Midline trachea, normal thyroid Lungs; Clear to auscultation bilaterally Breast:  No dominant palpable mass, retraction, or nipple discharge, has implant on left where had breast  cancer and had reduction on right Cardiovascular: Regular rate and rhythm, +click from heart valve Abdomen:  Soft, non tender, no hepatosplenomegaly Pelvic:  External genitalia is normal in appearance.  The vagina is normal in appearance. The cervix is bulbous and smooth,pap with HPV performed.  Uterus is felt to be normal size, shape, and contour.  No  adnexal masses or tenderness noted. Rectal: Good sphincter tone, no polyps, or hemorrhoids felt.  Hemoccult negative. Extremities:  No varicosities noted, has swelling right knee sp recent infection and hospitalization for IV antibiotics Psych:  No mood changes, alert and cooperative,seems happy Discussed menopausal symptoms.  Impression: Yearly gyn exam    Plan: Physical in 1 year Mammogram yearly  Labs with PCP Colonoscopy per Dr Gala Romney Review handout on peri menopause

## 2013-06-25 ENCOUNTER — Encounter (HOSPITAL_BASED_OUTPATIENT_CLINIC_OR_DEPARTMENT_OTHER): Payer: BC Managed Care – PPO | Admitting: Oncology

## 2013-06-25 ENCOUNTER — Encounter (HOSPITAL_COMMUNITY): Payer: Self-pay | Admitting: Oncology

## 2013-06-25 VITALS — BP 106/63 | HR 69 | Temp 97.9°F | Resp 18 | Wt 170.5 lb

## 2013-06-25 DIAGNOSIS — D594 Other nonautoimmune hemolytic anemias: Secondary | ICD-10-CM

## 2013-06-25 DIAGNOSIS — D65 Disseminated intravascular coagulation [defibrination syndrome]: Secondary | ICD-10-CM

## 2013-06-25 DIAGNOSIS — Z171 Estrogen receptor negative status [ER-]: Secondary | ICD-10-CM

## 2013-06-25 DIAGNOSIS — E039 Hypothyroidism, unspecified: Secondary | ICD-10-CM

## 2013-06-25 DIAGNOSIS — C50919 Malignant neoplasm of unspecified site of unspecified female breast: Secondary | ICD-10-CM

## 2013-06-25 DIAGNOSIS — M329 Systemic lupus erythematosus, unspecified: Secondary | ICD-10-CM | POA: Diagnosis not present

## 2013-06-25 DIAGNOSIS — Z853 Personal history of malignant neoplasm of breast: Secondary | ICD-10-CM | POA: Diagnosis not present

## 2013-06-25 DIAGNOSIS — E538 Deficiency of other specified B group vitamins: Secondary | ICD-10-CM

## 2013-06-25 DIAGNOSIS — Z954 Presence of other heart-valve replacement: Secondary | ICD-10-CM

## 2013-06-25 DIAGNOSIS — Z1371 Encounter for nonprocreative screening for genetic disease carrier status: Secondary | ICD-10-CM

## 2013-06-25 DIAGNOSIS — Z7901 Long term (current) use of anticoagulants: Secondary | ICD-10-CM

## 2013-06-25 DIAGNOSIS — D591 Autoimmune hemolytic anemia, unspecified: Secondary | ICD-10-CM

## 2013-06-25 DIAGNOSIS — Z9010 Acquired absence of unspecified breast and nipple: Secondary | ICD-10-CM

## 2013-06-25 NOTE — Patient Instructions (Signed)
[facility] Discharge Instructions  RECOMMENDATIONS MADE BY THE CONSULTANT AND ANY TEST RESULTS WILL BE SENT TO YOUR REFERRING PHYSICIAN.  We will see you in 6 months for a doctor visit and repeat labs.   Thank you for choosing [facility] to provide your oncology and hematology care.  To afford each patient quality time with our providers, please arrive at least 15 minutes before your scheduled appointment time.  With your help, our goal is to use those 15 minutes to complete the necessary work-up to ensure our physicians have the information they need to help with your evaluation and healthcare recommendations.    Effective January 1st, 2014, we ask that you re-schedule your appointment with our physicians should you arrive 10 or more minutes late for your appointment.  We strive to give you quality time with our providers, and arriving late affects you and other patients whose appointments are after yours.    Again, thank you for choosing [facility].  Our hope is that these requests will decrease the amount of time that you wait before being seen by our physicians.       _____________________________________________________________  Should you have questions after your visit to [facility], please contact our office at [phone] between the hours of 8:30 a.m. and 5:00 p.m.  Voicemails left after 4:30 p.m. will not be returned until the following business day.  For prescription refill requests, have your pharmacy contact our office with your prescription refill request.      

## 2013-06-26 ENCOUNTER — Inpatient Hospital Stay (HOSPITAL_COMMUNITY): Admission: RE | Admit: 2013-06-26 | Payer: BC Managed Care – PPO | Source: Ambulatory Visit

## 2013-06-29 ENCOUNTER — Telehealth: Payer: Self-pay | Admitting: Adult Health

## 2013-06-29 DIAGNOSIS — M329 Systemic lupus erythematosus, unspecified: Secondary | ICD-10-CM | POA: Diagnosis not present

## 2013-06-29 NOTE — Telephone Encounter (Signed)
Pt aware of pap with +HPV will get colpo, pt has history of breast cancer and husband cheated in past, will get colpo with Dr Elonda Husky

## 2013-07-01 ENCOUNTER — Ambulatory Visit (INDEPENDENT_AMBULATORY_CARE_PROVIDER_SITE_OTHER): Payer: BC Managed Care – PPO | Admitting: *Deleted

## 2013-07-01 DIAGNOSIS — Z7901 Long term (current) use of anticoagulants: Secondary | ICD-10-CM | POA: Diagnosis not present

## 2013-07-01 DIAGNOSIS — I059 Rheumatic mitral valve disease, unspecified: Secondary | ICD-10-CM

## 2013-07-01 DIAGNOSIS — Z5181 Encounter for therapeutic drug level monitoring: Secondary | ICD-10-CM | POA: Diagnosis not present

## 2013-07-01 DIAGNOSIS — Z954 Presence of other heart-valve replacement: Secondary | ICD-10-CM | POA: Diagnosis not present

## 2013-07-01 DIAGNOSIS — G8929 Other chronic pain: Secondary | ICD-10-CM | POA: Diagnosis not present

## 2013-07-01 DIAGNOSIS — E538 Deficiency of other specified B group vitamins: Secondary | ICD-10-CM | POA: Diagnosis not present

## 2013-07-01 DIAGNOSIS — Z6828 Body mass index (BMI) 28.0-28.9, adult: Secondary | ICD-10-CM | POA: Diagnosis not present

## 2013-07-01 LAB — POCT INR: INR: 2.7

## 2013-07-02 ENCOUNTER — Inpatient Hospital Stay (HOSPITAL_COMMUNITY): Admission: RE | Admit: 2013-07-02 | Payer: BC Managed Care – PPO | Source: Ambulatory Visit

## 2013-07-07 ENCOUNTER — Encounter: Payer: Self-pay | Admitting: Obstetrics & Gynecology

## 2013-07-07 ENCOUNTER — Ambulatory Visit (INDEPENDENT_AMBULATORY_CARE_PROVIDER_SITE_OTHER): Payer: BC Managed Care – PPO | Admitting: Obstetrics & Gynecology

## 2013-07-07 VITALS — BP 130/80 | Wt 168.0 lb

## 2013-07-07 DIAGNOSIS — B977 Papillomavirus as the cause of diseases classified elsewhere: Secondary | ICD-10-CM

## 2013-07-07 DIAGNOSIS — IMO0002 Reserved for concepts with insufficient information to code with codable children: Secondary | ICD-10-CM | POA: Insufficient documentation

## 2013-07-07 MED ORDER — FLUCONAZOLE 100 MG PO TABS: ORAL_TABLET | ORAL | Status: DC

## 2013-07-07 NOTE — Progress Notes (Signed)
Patient ID: [name], female   DOB: 12-21-1970, 43 y.o.   MRN: [id number] Pap performed:  06/24/2013 Result:  Normal cytology, HPV positive  History of abnormal Pap: no  Due to the indications listed above, a thorough colposcopic evaluation of the cervix and upper vagina was performed in the usual fashion using 3% acetic acid.  The findings of the visual inspection are noted below:  adequate Acetowhite changes       positive Punctation                      negative Mosaicism                      negative Abnormal Vessels          negative  Biopsy performed:          no  Impression: HPV atypia  Recommendation: Follow up Pap in 1 year  Past Medical History  Diagnosis Date  . SLE (systemic lupus erythematosus)   . BRCA2 negative   . BRCA1 negative   . 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 B12 deficiency   . Mitral valve disease, rheumatic     St. Jude prosthesis  . Raynaud disease   . Fibromyalgia   . Anxiety   . Peripheral neuropathy   . PONV (postoperative nausea and vomiting)   . Anticoagulation goal of INR 2.5 to 3.5   . Breast cancer 2011    Left side  . Autoimmune hepatitis     states per her rheumatologist, transaminases elevated but normalized after drug removed for   . Bursitis of right knee     septic bursitis    Past Surgical History  Procedure Laterality Date  . Mitral valve replacement    . Cholecystectomy    . Mastectomy    . Cardiac valve replacement    . Breast surgery    . Endometrial ablation  2008    She no longer has menses  . Insertion expander left breast  11/16/10  . Tissue expander placement  02/14/2011    Procedure: TISSUE EXPANDER;  Surgeon: Macon Large;  Location: [place];  Service: Plastics;  Laterality: Bilateral;  Left Breast Remove Tissue Expander Placement of Implant Breast Reconstruction  . Mastopexy  02/14/2011    Procedure: MASTOPEXY;  Surgeon: Macon Large;  Location: [place];  Service: Plastics;   Laterality: Bilateral;  Right Breast Reduction   . Esophagogastroduodenoscopy N/A 03/25/2013    Dr. Raliegh Scarlet reflux esophagitis-likely source of patient's symptoms (patulous EG junction). Hiatal hernia otherwise normal    OB History   Grav Para Term Preterm Abortions TAB SAB Ect Mult Living   _0 No Known Allergies  History   Social History  . Marital Status: Married    Spouse Name: N/A    Number of Children: 2  . Years of Education: N/A   Social History Main Topics  . Smoking status: Former Smoker -- 1.00 packs/day for 15 years    Types: Cigarettes    Quit date: 02/19/2006  . Smokeless tobacco: Never Used  . Alcohol Use: Yes     Comment: Occasional  . Drug Use: No  . Sexual Activity: No   Other Topics Concern  . None   Social History Narrative  . None    Family History  Problem Relation Age of Onset  .  Colon cancer Neg Hx     Past Medical History  Diagnosis Date  . SLE (systemic lupus erythematosus)   . BRCA2 negative   . BRCA1 negative   . 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 B12 deficiency   . Mitral valve disease, rheumatic     St. Jude prosthesis  . Raynaud disease   . Fibromyalgia   . Anxiety   . Peripheral neuropathy   . PONV (postoperative nausea and vomiting)   . Anticoagulation goal of INR 2.5 to 3.5   . Breast cancer 2011    Left side  . Autoimmune hepatitis     states per her rheumatologist, transaminases elevated but normalized after drug removed for   . Bursitis of right knee     septic bursitis    Past Surgical History  Procedure Laterality Date  . Mitral valve replacement    . Cholecystectomy    . Mastectomy    . Cardiac valve replacement    . Breast surgery    . Endometrial ablation  2008    She no longer has menses  . Insertion expander left breast  11/16/10  . Tissue expander placement  02/14/2011    Procedure: TISSUE EXPANDER;  Surgeon: Macon Large;  Location: [place];   Service: Plastics;  Laterality: Bilateral;  Left Breast Remove Tissue Expander Placement of Implant Breast Reconstruction  . Mastopexy  02/14/2011    Procedure: MASTOPEXY;  Surgeon: Macon Large;  Location: [place];  Service: Plastics;  Laterality: Bilateral;  Right Breast Reduction   . Esophagogastroduodenoscopy N/A 03/25/2013    Dr. Raliegh Scarlet reflux esophagitis-likely source of patient's symptoms (patulous EG junction). Hiatal hernia otherwise normal    OB History   Grav Para Term Preterm Abortions TAB SAB Ect Mult Living   _0 No Known Allergies  History   Social History  . Marital Status: Married    Spouse Name: N/A    Number of Children: 2  . Years of Education: N/A   Social History Main Topics  . Smoking status: Former Smoker -- 1.00 packs/day for 15 years    Types: Cigarettes    Quit date: 02/19/2006  . Smokeless tobacco: Never Used  . Alcohol Use: Yes     Comment: Occasional  . Drug Use: No  . Sexual Activity: No   Other Topics Concern  . None   Social History Narrative  . None    Family History  Problem Relation Age of Onset  . Colon cancer Neg Hx

## 2013-07-14 ENCOUNTER — Encounter (HOSPITAL_COMMUNITY)
Admission: RE | Admit: 2013-07-14 | Discharge: 2013-07-14 | Disposition: A | Discharge status: Home / Self Care | Payer: BC Managed Care – PPO | Source: Ambulatory Visit | Attending: Gastroenterology | Admitting: Gastroenterology

## 2013-07-14 ENCOUNTER — Encounter (HOSPITAL_COMMUNITY): Payer: Self-pay

## 2013-07-14 DIAGNOSIS — R140 Abdominal distension (gaseous): Secondary | ICD-10-CM

## 2013-07-14 DIAGNOSIS — R142 Eructation: Secondary | ICD-10-CM

## 2013-07-14 DIAGNOSIS — R143 Flatulence: Secondary | ICD-10-CM | POA: Diagnosis not present

## 2013-07-14 DIAGNOSIS — R110 Nausea: Secondary | ICD-10-CM | POA: Diagnosis not present

## 2013-07-14 DIAGNOSIS — R141 Gas pain: Secondary | ICD-10-CM | POA: Insufficient documentation

## 2013-07-14 MED ORDER — TECHNETIUM TC 99M SULFUR COLLOID
2.0000 | Freq: Once | INTRAVENOUS | Status: AC | PRN
  Administered 2013-07-14: 2 via ORAL

## 2013-07-15 ENCOUNTER — Other Ambulatory Visit: Payer: Self-pay | Admitting: Gastroenterology

## 2013-07-15 MED ORDER — METOCLOPRAMIDE HCL 5 MG/5ML PO SOLN
10.0000 mg | Freq: Two times a day (BID) | ORAL | Status: DC
Start: 2013-07-15 — End: 2013-08-13

## 2013-07-15 NOTE — Progress Notes (Signed)
Quick Note:  Delayed gastric emptying. Patient has gastroparesis.  I don't believe she is a diabetic.  I sent in Reglan 10 mg solution (this is covered by insurance versus the tablets) to take only TWICE a day for now.  I want her to monitor for any adverse effects such as involuntary movements, headache, confusion, dizziness, etc.  She needs to come back and see me in about 4 weeks. ______

## 2013-07-16 ENCOUNTER — Encounter: Payer: Self-pay | Admitting: Gastroenterology

## 2013-07-16 DIAGNOSIS — R6889 Other general symptoms and signs: Secondary | ICD-10-CM | POA: Diagnosis not present

## 2013-07-16 NOTE — Progress Notes (Signed)
Pt is aware of OV on 6/25 at 1030 with AS and appt card mailed

## 2013-07-20 ENCOUNTER — Ambulatory Visit (INDEPENDENT_AMBULATORY_CARE_PROVIDER_SITE_OTHER): Payer: BC Managed Care – PPO | Admitting: *Deleted

## 2013-07-20 ENCOUNTER — Other Ambulatory Visit: Payer: Self-pay | Admitting: Cardiology

## 2013-07-20 DIAGNOSIS — Z954 Presence of other heart-valve replacement: Secondary | ICD-10-CM

## 2013-07-20 DIAGNOSIS — I059 Rheumatic mitral valve disease, unspecified: Secondary | ICD-10-CM

## 2013-07-20 DIAGNOSIS — Z7901 Long term (current) use of anticoagulants: Secondary | ICD-10-CM | POA: Diagnosis not present

## 2013-07-20 DIAGNOSIS — Z5181 Encounter for therapeutic drug level monitoring: Secondary | ICD-10-CM

## 2013-07-20 LAB — PROTIME-INR
INR: 8.04 (ref <1.50)
Prothrombin Time: 63.8 seconds — ABNORMAL HIGH (ref 11.6–15.2)

## 2013-07-20 LAB — POCT INR: INR: >8

## 2013-07-27 ENCOUNTER — Ambulatory Visit (INDEPENDENT_AMBULATORY_CARE_PROVIDER_SITE_OTHER): Payer: BC Managed Care – PPO | Admitting: *Deleted

## 2013-07-27 DIAGNOSIS — Z7901 Long term (current) use of anticoagulants: Secondary | ICD-10-CM | POA: Diagnosis not present

## 2013-07-27 DIAGNOSIS — Z954 Presence of other heart-valve replacement: Secondary | ICD-10-CM

## 2013-07-27 DIAGNOSIS — I059 Rheumatic mitral valve disease, unspecified: Secondary | ICD-10-CM

## 2013-07-27 DIAGNOSIS — Z5181 Encounter for therapeutic drug level monitoring: Secondary | ICD-10-CM | POA: Diagnosis not present

## 2013-07-27 LAB — POCT INR: INR: 1.5

## 2013-07-30 ENCOUNTER — Ambulatory Visit (INDEPENDENT_AMBULATORY_CARE_PROVIDER_SITE_OTHER): Payer: BC Managed Care – PPO | Admitting: *Deleted

## 2013-07-30 ENCOUNTER — Ambulatory Visit (INDEPENDENT_AMBULATORY_CARE_PROVIDER_SITE_OTHER): Payer: BC Managed Care – PPO | Admitting: Surgery

## 2013-07-30 DIAGNOSIS — Z5181 Encounter for therapeutic drug level monitoring: Secondary | ICD-10-CM | POA: Diagnosis not present

## 2013-07-30 DIAGNOSIS — Z6828 Body mass index (BMI) 28.0-28.9, adult: Secondary | ICD-10-CM | POA: Diagnosis not present

## 2013-07-30 DIAGNOSIS — Z954 Presence of other heart-valve replacement: Secondary | ICD-10-CM

## 2013-07-30 DIAGNOSIS — I059 Rheumatic mitral valve disease, unspecified: Secondary | ICD-10-CM

## 2013-07-30 DIAGNOSIS — Z7901 Long term (current) use of anticoagulants: Secondary | ICD-10-CM | POA: Diagnosis not present

## 2013-07-30 DIAGNOSIS — G8929 Other chronic pain: Secondary | ICD-10-CM | POA: Diagnosis not present

## 2013-07-30 DIAGNOSIS — C50919 Malignant neoplasm of unspecified site of unspecified female breast: Secondary | ICD-10-CM

## 2013-07-30 LAB — POCT INR: INR: 3

## 2013-07-30 MED ORDER — WARFARIN SODIUM 5 MG PO TABS: 5.0000 mg | ORAL_TABLET | Freq: Every day | ORAL | Status: DC

## 2013-07-30 NOTE — Progress Notes (Signed)
[place], MD,  [address]  [place], [place]    [place] Phone:  [phone] FAX:  [phone]   Re:   [name] DOB:   09/22/1970 MRN:   [id number]  ASSESSMENT AND PLAN: 1. T1c, N0 left breast cancer.   Completed chemotx in July 2012.   Doing well. Disease free.   Reduction of right breast, reconstruction of left breast by Dr. Harlow Mares. For nipple reconstruction in April, 2013.   I wrote her a note for breast prosthesis - she uses with her reconstruction.  She'll see me back in 6 months.   2. Peripheral neuropathy, secondary to chemo.   Numbness in legs/feet is stable. She is not sure she is getting any improvement.  3. SLE.   She has been on Retuxin for this. 4. History of mitral valve replacement, on chronic Coumadin. Sees . Now sees Dr. Domenic Polite. 5.  On disability for heart issues. 6.  GERD/gastroparesis  Sees Dr. Buford Dresser - on Dexilent and Reglan 7.  Hospitalized recently for septic bursitis at [facility] 8.  Sees Dr. Luan Pulling for COPD/respiratory issues.  HISTORY OF PRESENT ILLNESS: No chief complaint on file.   [name] is a 43 y.o. (DOB: 1971/01/21)  white  female who is a patient of GOLDING, Betsy Coder, MD and comes to me today for follow up of breast cancer.  Path (side, TNM): T1c, N0  Surgery: left mastectomy  Date: 04/20/10  Size of tumor: 1.6 cm  Nodes: 0/3  ER: 0% PR: 0% Ki67: 92 HER2Neu: Neg  Medical Oncologist:  (Sees Tom, [place], at [facility]) Radiation Oncologist: Sondra Come (she did not require radiation tx)  Plastics: Harlow Mares   Past Medical History  Diagnosis Date  . SLE (systemic lupus erythematosus)   . BRCA2 negative   . BRCA1 negative   . 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 B12 deficiency   . Mitral valve disease, rheumatic     St. Jude prosthesis  . Raynaud disease   . Fibromyalgia   . Anxiety   . Peripheral neuropathy   . PONV (postoperative nausea and  vomiting)   . Anticoagulation goal of INR 2.5 to 3.5   . Breast cancer 2011    Left side  . Autoimmune hepatitis     states per her rheumatologist, transaminases elevated but normalized after drug removed for   . Bursitis of right knee     septic bursitis  . CHF (congestive heart failure)     SOCIAL HISTORY: Not married. Has daughter - 37, and son - 75 On disability for her heart  PHYSICAL EXAM: There were no vitals taken for this visit.  HEENT: Pupils equal. Dentition good. No injury. Has sort of pigmented skin.  NECK: Supple. No thyroid mass.  LYMPH NODES: No cervical, supraclavicular, or axillary adenopathy.  BREASTS - RIGHT: No palpable mass or nodule. No nipple discharge. Upper breast where the porta cath was she has skin pigmentation. She has the scars of reduction mammoplasty.   LEFT: Advertising account planner.  UPPER EXTREMITIES: No evidence of lymphedema.  DATA REVIEWED: Right mammogram ([facility] system) - 01/08/2013 - negative   Alphonsa Overall, MD,  [facility] [address]  [place], [place]    [place] Phone:  [phone] FAX:  [phone]

## 2013-07-31 ENCOUNTER — Other Ambulatory Visit (INDEPENDENT_AMBULATORY_CARE_PROVIDER_SITE_OTHER): Payer: Self-pay

## 2013-07-31 MED ORDER — UNABLE TO FIND: Status: DC

## 2013-08-07 ENCOUNTER — Other Ambulatory Visit (INDEPENDENT_AMBULATORY_CARE_PROVIDER_SITE_OTHER): Payer: Self-pay

## 2013-08-07 MED ORDER — UNABLE TO FIND: Status: DC

## 2013-08-11 ENCOUNTER — Encounter (HOSPITAL_COMMUNITY)
Admission: RE | Admit: 2013-08-11 | Discharge: 2013-08-11 | Disposition: A | Discharge status: Home / Self Care | Payer: BC Managed Care – PPO | Source: Ambulatory Visit | Attending: Rheumatology | Admitting: Rheumatology

## 2013-08-11 ENCOUNTER — Encounter (HOSPITAL_COMMUNITY): Payer: Self-pay

## 2013-08-11 DIAGNOSIS — M069 Rheumatoid arthritis, unspecified: Secondary | ICD-10-CM | POA: Insufficient documentation

## 2013-08-11 MED ORDER — ACETAMINOPHEN 325 MG PO TABS
650.0000 mg | ORAL_TABLET | Freq: Once | ORAL | Status: AC
  Administered 2013-08-11: 650 mg via ORAL

## 2013-08-11 MED ORDER — METHYLPREDNISOLONE SODIUM SUCC 125 MG IJ SOLR
100.0000 mg | Freq: Once | INTRAMUSCULAR | Status: AC
  Administered 2013-08-11: 100 mg via INTRAVENOUS

## 2013-08-11 MED ORDER — DIPHENHYDRAMINE HCL 25 MG PO CAPS
25.0000 mg | ORAL_CAPSULE | Freq: Once | ORAL | Status: AC
  Administered 2013-08-11: 25 mg via ORAL

## 2013-08-11 MED ORDER — DIPHENHYDRAMINE HCL 25 MG PO CAPS
ORAL_CAPSULE | ORAL | Status: AC
  Filled 2013-08-11: qty 1

## 2013-08-11 MED ORDER — SODIUM CHLORIDE 0.9 % IV SOLN
1000.0000 mg | Freq: Once | INTRAVENOUS | Status: AC
  Administered 2013-08-11: 1000 mg via INTRAVENOUS
  Filled 2013-08-11: qty 100

## 2013-08-11 MED ORDER — ACETAMINOPHEN 325 MG PO TABS
ORAL_TABLET | ORAL | Status: AC
  Filled 2013-08-11: qty 2

## 2013-08-11 MED ORDER — METHYLPREDNISOLONE SODIUM SUCC 125 MG IJ SOLR
INTRAMUSCULAR | Status: AC
  Filled 2013-08-11: qty 2

## 2013-08-11 MED ORDER — SODIUM CHLORIDE 0.9 % IV SOLN
Freq: Once | INTRAVENOUS | Status: AC
  Administered 2013-08-11: 250 mL via INTRAVENOUS

## 2013-08-13 ENCOUNTER — Encounter: Payer: Self-pay | Admitting: Gastroenterology

## 2013-08-13 ENCOUNTER — Ambulatory Visit (INDEPENDENT_AMBULATORY_CARE_PROVIDER_SITE_OTHER): Payer: BC Managed Care – PPO | Admitting: Gastroenterology

## 2013-08-13 ENCOUNTER — Ambulatory Visit (INDEPENDENT_AMBULATORY_CARE_PROVIDER_SITE_OTHER): Payer: BC Managed Care – PPO | Admitting: *Deleted

## 2013-08-13 VITALS — BP 120/81 | HR 61 | Temp 97.2°F | Ht 64.0 in | Wt 166.8 lb

## 2013-08-13 DIAGNOSIS — K3184 Gastroparesis: Secondary | ICD-10-CM

## 2013-08-13 DIAGNOSIS — Z7901 Long term (current) use of anticoagulants: Secondary | ICD-10-CM

## 2013-08-13 DIAGNOSIS — Z954 Presence of other heart-valve replacement: Secondary | ICD-10-CM

## 2013-08-13 DIAGNOSIS — I059 Rheumatic mitral valve disease, unspecified: Secondary | ICD-10-CM

## 2013-08-13 DIAGNOSIS — Z5181 Encounter for therapeutic drug level monitoring: Secondary | ICD-10-CM

## 2013-08-13 LAB — POCT INR: INR: 3.6

## 2013-08-13 MED ORDER — METOCLOPRAMIDE HCL 5 MG PO TABS: 5.0000 mg | ORAL_TABLET | Freq: Three times a day (TID) | ORAL | Status: DC

## 2013-08-13 NOTE — Assessment & Plan Note (Signed)
43 year old very pleasant, cheerful female presenting with history of erosive esophagitis and recent diagnosis of delayed gastric emptying as evidenced on GES. No significant improvement with Reglan 10 mg BID. Discussed side effects of Reglan in detail; she is completely asymptomatic. Will trial a lower dose actually TID instead of BID; Reglan 5 mg po TID sent to pharmacy. Continue Dexilant daily. If no improvement consider HBT to wrap up evaluation due to constellation of GI symptoms. Return in 3 months.

## 2013-08-13 NOTE — Progress Notes (Signed)
Referring Provider: Halford Chessman, MD Primary Care Physician:  Purvis Kilts, MD Primary GI: Dr. Gala Romney   Chief Complaint  Patient presents with  . Follow-up    doing ok    HPI:   [name] presents today with history of erosive esophagitis, bloating, and nausea. GES ordered in interim, revealing delayed gastric emptying. Started on low dose Reglan BID. Can't tell a huge difference in taking Reglan. Sometimes has to vomit before bed. Some underlying nausea and bloating, discomfort occasionally. Taking Phenergan as needed. Eats about 2 meals per day. Dexilant daily. Still feels bloated. Taking a probiotic. Occasional loose stool.    Past Medical History  Diagnosis Date  . SLE (systemic lupus erythematosus)   . BRCA2 negative   . BRCA1 negative   . 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 B12 deficiency   . Mitral valve disease, rheumatic     St. Jude prosthesis  . Raynaud disease   . Fibromyalgia   . Anxiety   . Peripheral neuropathy   . PONV (postoperative nausea and vomiting)   . Anticoagulation goal of INR 2.5 to 3.5   . Breast cancer 2011    Left side  . Drug-induced hepatitis     states per her rheumatologist, transaminases elevated but normalized after drug removed for   . Bursitis of right knee     septic bursitis  . CHF (congestive heart failure)     Past Surgical History  Procedure Laterality Date  . Mitral valve replacement    . Cholecystectomy    . Mastectomy    . Cardiac valve replacement    . Breast surgery    . Endometrial ablation  2008    She no longer has menses  . Insertion expander left breast  11/16/10  . Tissue expander placement  02/14/2011    Procedure: TISSUE EXPANDER;  Surgeon: Macon Large;  Location: [place];  Service: Plastics;  Laterality: Bilateral;  Left Breast Remove Tissue Expander Placement of Implant Breast Reconstruction  . Mastopexy  02/14/2011    Procedure: MASTOPEXY;  Surgeon: Macon Large;  Location: [place];  Service: Plastics;  Laterality: Bilateral;  Right Breast Reduction   . Esophagogastroduodenoscopy N/A 03/25/2013    Dr. Raliegh Scarlet reflux esophagitis-likely source of patient's symptoms (patulous EG junction). Hiatal hernia otherwise normal    Current Outpatient Prescriptions  Medication Sig Dispense Refill  . cyanocobalamin (,VITAMIN B-12,) 1000 MCG/ML injection Inject 1 mL (1,000 mcg total) into the muscle every 30 (thirty) days.  10 mL  prn  . dexlansoprazole (DEXILANT) 60 MG capsule Take 1 capsule (60 mg total) by mouth daily.  31 capsule  11  . furosemide (LASIX) 40 MG tablet Take 1 tablet (40 mg total) by mouth daily.  90 tablet  3  . gabapentin (NEURONTIN) 600 MG tablet Take 600 mg by mouth 2 (two) times daily.       . hydroxychloroquine (PLAQUENIL) 200 MG tablet Take 200 mg by mouth 2 (two) times daily.        Marland Kitchen ibuprofen (ADVIL,MOTRIN) 200 MG tablet Take 800 mg by mouth every 6 (six) hours as needed for headache.      . levothyroxine (SYNTHROID, LEVOTHROID) 100 MCG tablet Take 100 mcg by mouth daily before breakfast.      . oxyCODONE-acetaminophen (PERCOCET) 7.5-325 MG per tablet Take 1 tablet by mouth every 4 (four) hours as needed for pain.      . potassium  chloride SA (K-DUR,KLOR-CON) 20 MEQ tablet Take 20 mEq by mouth daily.      . promethazine (PHENERGAN) 25 MG tablet Take 25 mg by mouth every 6 (six) hours as needed for nausea or vomiting.      Marland Kitchen rOPINIRole (REQUIP) 0.5 MG tablet Take 1 mg by mouth at bedtime.       Marland Kitchen warfarin (COUMADIN) 5 MG tablet Take 1-2 tablets (5-10 mg total) by mouth daily. Pt takes 5 mg on Tues., Thurs. Sat. And 10 mg on Mon., Wed., Fri. , and Sun.  60 tablet  6  . zolpidem (AMBIEN) 10 MG tablet Take 10 mg by mouth at bedtime as needed for sleep.       . metoCLOPramide (REGLAN) 5 MG tablet Take 1 tablet (5 mg total) by mouth 3 (three) times daily before meals.  90 tablet  3  . UNABLE TO FIND L8000 Post-Surgical Bras-6  B0175  Silicone Breast Prosthesis-1  Dx: 174.9 Right Partial Mastectomy  1 each  0   No current facility-administered medications for this visit.    Allergies as of 08/13/2013  . (No Known Allergies)    Family History  Problem Relation Age of Onset  . Colon cancer Neg Hx     History   Social History  . Marital Status: Married    Spouse Name: N/A    Number of Children: 2  . Years of Education: N/A   Social History Main Topics  . Smoking status: Former Smoker -- 1.00 packs/day for 15 years    Types: Cigarettes    Quit date: 02/19/2006  . Smokeless tobacco: Never Used  . Alcohol Use: Yes     Comment: Occasional  . Drug Use: No  . Sexual Activity: No   Other Topics Concern  . None   Social History Narrative  . None    Review of Systems: As mentioned in HPI.   Physical Exam: BP 120/81  Pulse 61  Temp(Src) 97.2 F (36.2 C) (Oral)  Ht _0  (1.626 m)  Wt 166 lb 12.8 oz (75.66 kg)  BMI 28.62 kg/m2 General:   Alert and oriented. No distress noted. Pleasant and cooperative. Cheerful.  Abdomen:  +BS, soft, non-tender and non-distended. No rebound or guarding. No HSM or masses noted. Msk:  Symmetrical without gross deformities. Normal posture. Extremities:  Without edema. Neurologic:  Alert and  oriented x4;  grossly normal neurologically. Skin:  Intact without significant lesions or rashes. Psych:  Alert and cooperative. Normal mood and affect.

## 2013-08-13 NOTE — Patient Instructions (Addendum)
Take Reglan 5 mg with breakfast, lunch, and dinner. Review the gastroparesis diet.   Call me in about 2 weeks with an update. If no improvement, we will do a hydrogen breath test.   Continue Dexilant once daily.    Gastroparesis  Gastroparesis is also called slowed stomach emptying (delayed gastric emptying). It is a condition in which the stomach takes too long to empty its contents. It often happens in people with diabetes.  CAUSES  Gastroparesis happens when nerves to the stomach are damaged or stop working. When the nerves are damaged, the muscles of the stomach and intestines do not work normally. The movement of food is slowed or stopped. High blood glucose (sugar) causes changes in nerves and can damage the blood vessels that carry oxygen and nutrients to the nerves. RISK FACTORS  Diabetes.  Post-viral syndromes.  Eating disorders (anorexia, bulimia).  Surgery on the stomach or vagus nerve.  Gastroesophageal reflux disease (rarely).  Smooth muscle disorders (amyloidosis, scleroderma).  Metabolic disorders, including hypothyroidism.  Parkinson disease. SYMPTOMS   Heartburn.  Feeling sick to your stomach (nausea).  Vomiting of undigested food.  An early feeling of fullness when eating.  Weight loss.  Abdominal bloating.  Erratic blood glucose levels.  Lack of appetite.  Gastroesophageal reflux.  Spasms of the stomach wall. Complications can include:  Bacterial overgrowth in stomach. Food stays in the stomach and can ferment and cause bacteria to grow.  Weight loss due to difficulty digesting and absorbing nutrients.  Vomiting.  Obstruction in the stomach. Undigested food can harden and cause nausea and vomiting.  Blood glucose fluctuations caused by inconsistent food absorption. DIAGNOSIS  The diagnosis of gastroparesis is confirmed through one or more of the following tests:  Barium X-rays and scans. These tests look at how long it takes for food  to move through the stomach.  Gastric manometry. This test measures electrical and muscular activity in the stomach. A thin tube is passed down the throat into the stomach. The tube contains a wire that takes measurements of the stomach's electrical and muscular activity as it digests liquids and solid food.  Endoscopy. This procedure is done with a long, thin tube called an endoscope. It is passed through the mouth and gently down the esophagus into the stomach. This tube helps the caregiver look at the lining of the stomach to check for any abnormalities.  Ultrasonography. This can rule out gallbladder disease or pancreatitis. This test will outline and define the shape of the gallbladder and pancreas. TREATMENT   Treatments may include:  Exercise.  Medicines to control nausea and vomiting.  Medicines to stimulate stomach muscles.  Changes in what and when you eat.  Having smaller meals more often.  Eating low-fiber forms of high-fiber foods, such as eating cooked vegetables instead of raw vegetables.  Eating low-fat foods.  Consuming liquids, which are easier to digest.  In severe cases, feeding tubes and intravenous (IV) feeding may be needed. It is important to note that in most cases, treatment does not cure gastroparesis. It is usually a lasting (chronic) condition. Treatment helps you manage the underlying condition so that you can be as healthy and comfortable as possible. Other treatments  A gastric neurostimulator has been developed to assist people with gastroparesis. The battery-operated device is surgically implanted. It emits mild electrical pulses to help improve stomach emptying and to control nausea and vomiting.  The use of botulinum toxin has been shown to improve stomach emptying by decreasing the prolonged  contractions of the muscle between the stomach and the small intestine (pyloric sphincter). The benefits are temporary. SEEK MEDICAL CARE IF:   You have  diabetes and you are having problems keeping your blood glucose in goal range.  You are having nausea, vomiting, bloating, or early feelings of fullness with eating.  Your symptoms do not change with a change in diet. Document Released: 02/05/2005 Document Revised: 06/02/2012 Document Reviewed: 07/15/2008 [facility] Patient Information 2015 [place], [place]. This information is not intended to replace advice given to you by your health care provider. Make sure you discuss any questions you have with your health care provider.

## 2013-08-14 ENCOUNTER — Other Ambulatory Visit (INDEPENDENT_AMBULATORY_CARE_PROVIDER_SITE_OTHER): Payer: Self-pay

## 2013-08-14 MED ORDER — UNABLE TO FIND: Status: DC

## 2013-08-17 NOTE — Progress Notes (Signed)
cc'd to pcp 

## 2013-08-27 DIAGNOSIS — G8929 Other chronic pain: Secondary | ICD-10-CM | POA: Diagnosis not present

## 2013-08-27 DIAGNOSIS — E538 Deficiency of other specified B group vitamins: Secondary | ICD-10-CM | POA: Diagnosis not present

## 2013-08-27 DIAGNOSIS — Z6827 Body mass index (BMI) 27.0-27.9, adult: Secondary | ICD-10-CM | POA: Diagnosis not present

## 2013-09-03 ENCOUNTER — Ambulatory Visit (INDEPENDENT_AMBULATORY_CARE_PROVIDER_SITE_OTHER): Payer: BC Managed Care – PPO | Admitting: *Deleted

## 2013-09-03 DIAGNOSIS — Z5181 Encounter for therapeutic drug level monitoring: Secondary | ICD-10-CM

## 2013-09-03 DIAGNOSIS — Z954 Presence of other heart-valve replacement: Secondary | ICD-10-CM

## 2013-09-03 DIAGNOSIS — I059 Rheumatic mitral valve disease, unspecified: Secondary | ICD-10-CM

## 2013-09-03 DIAGNOSIS — Z7901 Long term (current) use of anticoagulants: Secondary | ICD-10-CM

## 2013-09-03 LAB — POCT INR: INR: 3.5

## 2013-09-23 DIAGNOSIS — E538 Deficiency of other specified B group vitamins: Secondary | ICD-10-CM | POA: Diagnosis not present

## 2013-09-23 DIAGNOSIS — Z6828 Body mass index (BMI) 28.0-28.9, adult: Secondary | ICD-10-CM | POA: Diagnosis not present

## 2013-09-23 DIAGNOSIS — G8929 Other chronic pain: Secondary | ICD-10-CM | POA: Diagnosis not present

## 2013-10-01 ENCOUNTER — Ambulatory Visit (INDEPENDENT_AMBULATORY_CARE_PROVIDER_SITE_OTHER): Payer: BC Managed Care – PPO | Admitting: *Deleted

## 2013-10-01 DIAGNOSIS — Z5181 Encounter for therapeutic drug level monitoring: Secondary | ICD-10-CM

## 2013-10-01 DIAGNOSIS — I059 Rheumatic mitral valve disease, unspecified: Secondary | ICD-10-CM

## 2013-10-01 DIAGNOSIS — Z954 Presence of other heart-valve replacement: Secondary | ICD-10-CM | POA: Diagnosis not present

## 2013-10-01 DIAGNOSIS — Z7901 Long term (current) use of anticoagulants: Secondary | ICD-10-CM | POA: Diagnosis not present

## 2013-10-01 LAB — POCT INR: INR: 3.9

## 2013-10-02 ENCOUNTER — Telehealth: Payer: Self-pay | Admitting: *Deleted

## 2013-10-02 NOTE — Telephone Encounter (Signed)
Please call patient ASAP regarding Lovenox bridging for procedure.  tgs

## 2013-10-05 NOTE — Telephone Encounter (Signed)
Epidural injection is planned for 8/28 at [facility] in [place].  Pt has appt with me on 8/20 for Lovenox bridging instructions.

## 2013-10-08 ENCOUNTER — Ambulatory Visit (INDEPENDENT_AMBULATORY_CARE_PROVIDER_SITE_OTHER): Payer: BC Managed Care – PPO | Admitting: *Deleted

## 2013-10-08 DIAGNOSIS — Z7901 Long term (current) use of anticoagulants: Secondary | ICD-10-CM

## 2013-10-08 DIAGNOSIS — I059 Rheumatic mitral valve disease, unspecified: Secondary | ICD-10-CM

## 2013-10-08 DIAGNOSIS — Z954 Presence of other heart-valve replacement: Secondary | ICD-10-CM

## 2013-10-08 DIAGNOSIS — Z5181 Encounter for therapeutic drug level monitoring: Secondary | ICD-10-CM

## 2013-10-08 LAB — POCT INR: INR: 2.8

## 2013-10-08 MED ORDER — ENOXAPARIN SODIUM 80 MG/0.8ML ~~LOC~~ SOLN: 80.0000 mg | Freq: Two times a day (BID) | SUBCUTANEOUS | Status: DC

## 2013-10-08 NOTE — Patient Instructions (Signed)
8/22  Last dose of coumadin 8/23  No Lovenox or coumadin 8/24  Lovenox 80mg  SQ 8am & 8pm 8/25  Lovenox 80mg  SQ 8am & 8pm 8/26  Lovenox 80mg  SQ 8am & 8pm 8/27  Lovenox 80mg  SQ 8am ---- no lovenox in pm 8/28  No lovenox in am ---epidural ----- coumadin 10mg  pm 8/29  Lovenox 80mg  SQ 8am & 8pm and coumadin 10mg  pm 8/30   Lovenox 80mg  SQ 8am & 8pm and coumadin 10mg  pm 8/31   Lovenox 80mg  SQ 8am & INR appt @ 11:00am

## 2013-10-09 ENCOUNTER — Ambulatory Visit: Payer: BC Managed Care – PPO | Admitting: Cardiology

## 2013-10-12 ENCOUNTER — Telehealth: Payer: Self-pay | Admitting: *Deleted

## 2013-10-12 NOTE — Telephone Encounter (Signed)
Received call from [facility].  Pt is scheduled for epidural injection in back on 8/28.  They need clearance from you that is it OK for her to have procedure.  I have already given her her Lovenox instructions.  If OK with with you please respond and I will fax to [facility].

## 2013-10-12 NOTE — Telephone Encounter (Signed)
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 From a general cardiology perspective, she should be able to proceed with epidural injection. Main issue is making sure that she has adequate coverage with Lovenox when she is off Coumadin.

## 2013-10-12 NOTE — Telephone Encounter (Signed)
Faxed to [facility] @ [employer] [phone]

## 2013-10-14 ENCOUNTER — Ambulatory Visit: Payer: BC Managed Care – PPO | Admitting: Cardiology

## 2013-10-19 ENCOUNTER — Ambulatory Visit (INDEPENDENT_AMBULATORY_CARE_PROVIDER_SITE_OTHER): Payer: BC Managed Care – PPO | Admitting: *Deleted

## 2013-10-19 DIAGNOSIS — I059 Rheumatic mitral valve disease, unspecified: Secondary | ICD-10-CM | POA: Diagnosis not present

## 2013-10-19 DIAGNOSIS — Z954 Presence of other heart-valve replacement: Secondary | ICD-10-CM

## 2013-10-19 DIAGNOSIS — Z5181 Encounter for therapeutic drug level monitoring: Secondary | ICD-10-CM

## 2013-10-19 DIAGNOSIS — Z7901 Long term (current) use of anticoagulants: Secondary | ICD-10-CM | POA: Diagnosis not present

## 2013-10-19 LAB — POCT INR: INR: 1.5

## 2013-10-20 DIAGNOSIS — C50919 Malignant neoplasm of unspecified site of unspecified female breast: Secondary | ICD-10-CM | POA: Diagnosis not present

## 2013-10-20 DIAGNOSIS — D599 Acquired hemolytic anemia, unspecified: Secondary | ICD-10-CM | POA: Diagnosis not present

## 2013-10-22 ENCOUNTER — Ambulatory Visit (INDEPENDENT_AMBULATORY_CARE_PROVIDER_SITE_OTHER): Payer: BC Managed Care – PPO | Admitting: Pharmacist

## 2013-10-22 ENCOUNTER — Encounter: Payer: Self-pay | Admitting: Internal Medicine

## 2013-10-22 DIAGNOSIS — I059 Rheumatic mitral valve disease, unspecified: Secondary | ICD-10-CM | POA: Diagnosis not present

## 2013-10-22 DIAGNOSIS — Z954 Presence of other heart-valve replacement: Secondary | ICD-10-CM

## 2013-10-22 DIAGNOSIS — Z5181 Encounter for therapeutic drug level monitoring: Secondary | ICD-10-CM

## 2013-10-22 DIAGNOSIS — Z7901 Long term (current) use of anticoagulants: Secondary | ICD-10-CM | POA: Diagnosis not present

## 2013-10-22 LAB — POCT INR: INR: 2

## 2013-10-27 DIAGNOSIS — E538 Deficiency of other specified B group vitamins: Secondary | ICD-10-CM | POA: Diagnosis not present

## 2013-10-27 DIAGNOSIS — Z6828 Body mass index (BMI) 28.0-28.9, adult: Secondary | ICD-10-CM | POA: Diagnosis not present

## 2013-10-27 DIAGNOSIS — G4700 Insomnia, unspecified: Secondary | ICD-10-CM | POA: Diagnosis not present

## 2013-10-27 DIAGNOSIS — G8929 Other chronic pain: Secondary | ICD-10-CM | POA: Diagnosis not present

## 2013-11-04 ENCOUNTER — Ambulatory Visit (INDEPENDENT_AMBULATORY_CARE_PROVIDER_SITE_OTHER): Payer: BC Managed Care – PPO | Admitting: *Deleted

## 2013-11-04 DIAGNOSIS — Z954 Presence of other heart-valve replacement: Secondary | ICD-10-CM

## 2013-11-04 DIAGNOSIS — Z5181 Encounter for therapeutic drug level monitoring: Secondary | ICD-10-CM | POA: Diagnosis not present

## 2013-11-04 DIAGNOSIS — Z7901 Long term (current) use of anticoagulants: Secondary | ICD-10-CM | POA: Diagnosis not present

## 2013-11-04 DIAGNOSIS — I059 Rheumatic mitral valve disease, unspecified: Secondary | ICD-10-CM

## 2013-11-04 LAB — POCT INR: INR: 1.5

## 2013-11-06 ENCOUNTER — Ambulatory Visit (INDEPENDENT_AMBULATORY_CARE_PROVIDER_SITE_OTHER): Payer: BC Managed Care – PPO | Admitting: *Deleted

## 2013-11-06 DIAGNOSIS — Z5181 Encounter for therapeutic drug level monitoring: Secondary | ICD-10-CM

## 2013-11-06 DIAGNOSIS — I059 Rheumatic mitral valve disease, unspecified: Secondary | ICD-10-CM

## 2013-11-06 DIAGNOSIS — Z7901 Long term (current) use of anticoagulants: Secondary | ICD-10-CM

## 2013-11-06 DIAGNOSIS — Z954 Presence of other heart-valve replacement: Secondary | ICD-10-CM

## 2013-11-06 LAB — POCT INR: INR: 2.5

## 2013-11-11 ENCOUNTER — Encounter (HOSPITAL_COMMUNITY): Payer: Self-pay | Admitting: Oncology

## 2013-11-11 ENCOUNTER — Other Ambulatory Visit (HOSPITAL_COMMUNITY): Payer: Self-pay | Admitting: Oncology

## 2013-11-11 DIAGNOSIS — C50919 Malignant neoplasm of unspecified site of unspecified female breast: Secondary | ICD-10-CM | POA: Diagnosis not present

## 2013-11-11 DIAGNOSIS — M329 Systemic lupus erythematosus, unspecified: Secondary | ICD-10-CM | POA: Diagnosis not present

## 2013-11-16 ENCOUNTER — Ambulatory Visit (INDEPENDENT_AMBULATORY_CARE_PROVIDER_SITE_OTHER): Payer: BC Managed Care – PPO | Admitting: *Deleted

## 2013-11-16 DIAGNOSIS — Z954 Presence of other heart-valve replacement: Secondary | ICD-10-CM | POA: Diagnosis not present

## 2013-11-16 DIAGNOSIS — I059 Rheumatic mitral valve disease, unspecified: Secondary | ICD-10-CM

## 2013-11-16 DIAGNOSIS — Z5181 Encounter for therapeutic drug level monitoring: Secondary | ICD-10-CM

## 2013-11-16 DIAGNOSIS — Z7901 Long term (current) use of anticoagulants: Secondary | ICD-10-CM | POA: Diagnosis not present

## 2013-11-16 LAB — POCT INR: INR: 2

## 2013-11-17 ENCOUNTER — Other Ambulatory Visit: Payer: Self-pay | Admitting: *Deleted

## 2013-11-17 ENCOUNTER — Ambulatory Visit (HOSPITAL_COMMUNITY)
Admission: RE | Admit: 2013-11-17 | Discharge: 2013-11-17 | Disposition: A | Discharge status: Home / Self Care | Payer: BC Managed Care – PPO | Source: Ambulatory Visit | Attending: Cardiology | Admitting: Cardiology

## 2013-11-17 ENCOUNTER — Encounter (HOSPITAL_COMMUNITY): Payer: Self-pay | Admitting: Oncology

## 2013-11-17 ENCOUNTER — Other Ambulatory Visit (HOSPITAL_COMMUNITY): Payer: Self-pay | Admitting: Oncology

## 2013-11-17 ENCOUNTER — Telehealth: Payer: Self-pay | Admitting: *Deleted

## 2013-11-17 DIAGNOSIS — I079 Rheumatic tricuspid valve disease, unspecified: Secondary | ICD-10-CM | POA: Insufficient documentation

## 2013-11-17 DIAGNOSIS — Z954 Presence of other heart-valve replacement: Secondary | ICD-10-CM | POA: Insufficient documentation

## 2013-11-17 DIAGNOSIS — I059 Rheumatic mitral valve disease, unspecified: Secondary | ICD-10-CM | POA: Diagnosis not present

## 2013-11-17 DIAGNOSIS — Z87891 Personal history of nicotine dependence: Secondary | ICD-10-CM | POA: Insufficient documentation

## 2013-11-17 DIAGNOSIS — M329 Systemic lupus erythematosus, unspecified: Secondary | ICD-10-CM | POA: Diagnosis not present

## 2013-11-17 DIAGNOSIS — Z853 Personal history of malignant neoplasm of breast: Secondary | ICD-10-CM | POA: Diagnosis not present

## 2013-11-17 DIAGNOSIS — I080 Rheumatic disorders of both mitral and aortic valves: Secondary | ICD-10-CM

## 2013-11-17 NOTE — Telephone Encounter (Signed)
Message copied by Desma Mcgregor on Tue Nov 17, 2013  4:31 PM ------      Message from: Satira Sark      Created: Tue Nov 17, 2013  4:16 PM       Reviewed. LVEF is stable as is mitral prosthesis. ------

## 2013-11-17 NOTE — Progress Notes (Signed)
  Echocardiogram 2D Echocardiogram has been performed.  [place], [place] 11/17/2013, 3:57 PM

## 2013-11-17 NOTE — Telephone Encounter (Signed)
Pt aware. Forwarded to Dr. Hilma Favors

## 2013-11-19 ENCOUNTER — Ambulatory Visit (INDEPENDENT_AMBULATORY_CARE_PROVIDER_SITE_OTHER): Payer: BC Managed Care – PPO | Admitting: *Deleted

## 2013-11-19 DIAGNOSIS — Z5181 Encounter for therapeutic drug level monitoring: Secondary | ICD-10-CM

## 2013-11-19 DIAGNOSIS — Z952 Presence of prosthetic heart valve: Secondary | ICD-10-CM

## 2013-11-19 DIAGNOSIS — Z954 Presence of other heart-valve replacement: Secondary | ICD-10-CM | POA: Diagnosis not present

## 2013-11-19 DIAGNOSIS — Z7901 Long term (current) use of anticoagulants: Secondary | ICD-10-CM | POA: Diagnosis not present

## 2013-11-19 DIAGNOSIS — I059 Rheumatic mitral valve disease, unspecified: Secondary | ICD-10-CM | POA: Diagnosis not present

## 2013-11-19 LAB — POCT INR: INR: 2.8

## 2013-11-23 ENCOUNTER — Ambulatory Visit (INDEPENDENT_AMBULATORY_CARE_PROVIDER_SITE_OTHER): Payer: BC Managed Care – PPO | Admitting: Cardiology

## 2013-11-23 ENCOUNTER — Encounter: Payer: Self-pay | Admitting: Cardiology

## 2013-11-23 VITALS — BP 110/78 | HR 70 | Ht 64.0 in | Wt 170.0 lb

## 2013-11-23 DIAGNOSIS — Z954 Presence of other heart-valve replacement: Secondary | ICD-10-CM

## 2013-11-23 DIAGNOSIS — Z79899 Other long term (current) drug therapy: Secondary | ICD-10-CM | POA: Diagnosis not present

## 2013-11-23 DIAGNOSIS — D0592 Unspecified type of carcinoma in situ of left breast: Secondary | ICD-10-CM | POA: Diagnosis not present

## 2013-11-23 DIAGNOSIS — I059 Rheumatic mitral valve disease, unspecified: Secondary | ICD-10-CM | POA: Diagnosis not present

## 2013-11-23 DIAGNOSIS — D510 Vitamin B12 deficiency anemia due to intrinsic factor deficiency: Secondary | ICD-10-CM | POA: Diagnosis not present

## 2013-11-23 DIAGNOSIS — Z952 Presence of prosthetic heart valve: Secondary | ICD-10-CM

## 2013-11-23 DIAGNOSIS — E782 Mixed hyperlipidemia: Secondary | ICD-10-CM | POA: Diagnosis not present

## 2013-11-23 DIAGNOSIS — Z5181 Encounter for therapeutic drug level monitoring: Secondary | ICD-10-CM | POA: Diagnosis not present

## 2013-11-23 DIAGNOSIS — Z6829 Body mass index (BMI) 29.0-29.9, adult: Secondary | ICD-10-CM | POA: Diagnosis not present

## 2013-11-23 DIAGNOSIS — Z23 Encounter for immunization: Secondary | ICD-10-CM | POA: Diagnosis not present

## 2013-11-23 NOTE — Patient Instructions (Signed)
Your physician wants you to follow-up in: 6 months You will receive a reminder letter in the mail two months in advance. If you don't receive a letter, please call our office to schedule the follow-up appointment.     Your physician recommends that you continue on your current medications as directed. Please refer to the Current Medication list given to you today.      Thank you for choosing [facility] !        

## 2013-11-23 NOTE — Assessment & Plan Note (Signed)
The patient being treated with Rituxan, 2 episodes in the last 6 months. Recent followup echocardiogram showed stable LVEF of 50-55%.

## 2013-11-23 NOTE — Progress Notes (Signed)
Clinical Summary Ms. Lukic is a 43 y.o.female last seen in March. She reports feeling "good" in general. No progressive shortness of breath or chest pain. She states she has had 2 treatments with Rituxan in the last 6 months. Stable LVEF by followup echocardiogram noted below.  Recent followup echocardiogram on September 29 showed mild LVH with LVEF 50-55%, indeterminate diastolic function, severe left atrial enlargement, St. Jude prosthesis and mitral position with probable mild mitral regurgitation, mean gradient 3 mm mercury, normal PASP 18 mm mercury. Most recent INR 2.8.  No Known Allergies  Current Outpatient Prescriptions  Medication Sig Dispense Refill  . ALPRAZolam (XANAX) 0.5 MG tablet Take 0.5 mg by mouth at bedtime.       . cyanocobalamin (,VITAMIN B-12,) 1000 MCG/ML injection Inject 1 mL (1,000 mcg total) into the muscle every 30 (thirty) days.  10 mL  prn  . dexlansoprazole (DEXILANT) 60 MG capsule Take 1 capsule (60 mg total) by mouth daily.  31 capsule  11  . furosemide (LASIX) 40 MG tablet Take 1 tablet (40 mg total) by mouth daily.  90 tablet  3  . gabapentin (NEURONTIN) 600 MG tablet Take 600 mg by mouth 2 (two) times daily.       . hydroxychloroquine (PLAQUENIL) 200 MG tablet Take 200 mg by mouth 2 (two) times daily.        Marland Kitchen ibuprofen (ADVIL,MOTRIN) 200 MG tablet Take 800 mg by mouth every 6 (six) hours as needed for headache.      . levothyroxine (SYNTHROID, LEVOTHROID) 100 MCG tablet Take 100 mcg by mouth daily before breakfast.      . metoCLOPramide (REGLAN) 5 MG tablet Take 1 tablet (5 mg total) by mouth 3 (three) times daily before meals.  90 tablet  3  . oxyCODONE-acetaminophen (PERCOCET) 7.5-325 MG per tablet Take 1 tablet by mouth every 4 (four) hours as needed for pain.      . potassium chloride (KLOR-CON 10) 10 MEQ tablet Take one tablet daily as directed      . promethazine (PHENERGAN) 25 MG tablet Take 25 mg by mouth every 6 (six) hours as needed for nausea  or vomiting.      Marland Kitchen rOPINIRole (REQUIP) 0.5 MG tablet Take 1 mg by mouth at bedtime.       Marland Kitchen UNABLE TO FIND L8000 Post-Surgical Bras-6  D9242 Silicone Breast Prosthesis-1  Dx: 174.9 Right Partial Mastectomy  1 each  0  . warfarin (COUMADIN) 5 MG tablet Take 1-2 tablets (5-10 mg total) by mouth daily. Pt takes 5 mg on Tues., Thurs. Sat. And 10 mg on Mon., Wed., Fri. , and Sun.  60 tablet  6  . zolpidem (AMBIEN) 10 MG tablet Take 10 mg by mouth at bedtime as needed for sleep.        No current facility-administered medications for this visit.    Past Medical History  Diagnosis Date  . SLE (systemic lupus erythematosus)   . BRCA2 negative   . BRCA1 negative   . 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 B12 deficiency   . Mitral valve disease, rheumatic     St. Jude prosthesis  . Raynaud disease   . Fibromyalgia   . Anxiety   . Peripheral neuropathy   . Anticoagulation goal of INR 2.5 to 3.5   . Breast cancer 2011    Left side  . Drug-induced hepatitis     States per her rheumatologist, transaminases elevated but normalized  after drug removed for   . Bursitis of right knee     Septic bursitis    Social History Ms. Moyd reports that she quit smoking about 7 years ago. Her smoking use included Cigarettes. She has a 15 pack-year smoking history. She has never used smokeless tobacco. Ms. Whittenberg reports that she drinks alcohol.  Review of Systems No palpitations, dizziness, syncope. No bleeding problems on Coumadin. Other systems reviewed and negative.  Physical Examination Filed Vitals:   11/23/13 0955  BP: 110/78  Pulse: 70   Filed Weights   11/23/13 0955  Weight: 170 lb (77.111 kg)    Appears comfortable at rest.  HEENT: Conjunctiva and lids normal, oropharynx clear.  Neck: Supple, no elevated JVP or carotid bruits, no thyromegaly.  Lungs: Clear to auscultation, nonlabored breathing at rest.  Cardiac: Regular rate and rhythm, crisp  prosthetic sound in S1, no S3 or significant systolic murmur, no pericardial rub.  Abdomen: Soft, nontender, bowel sounds present, no guarding or rebound.  Extremities: No pitting edema, distal pulses 2+.  Skin: Warm and dry.  Musculoskeletal: No kyphosis.  Neuropsychiatric: Alert and oriented x3, affect grossly appropriate.   Problem List and Plan   History of mitral valve replacement with mechanical valve St. Jude prosthesis, stable by recent followup echocardiogram with normal gradients. Continue on Coumadin with followup in the Coumadin clinic.  Encounter for monitoring cardiotoxic drug therapy The patient being treated with Rituxan, 2 episodes in the last 6 months. Recent followup echocardiogram showed stable LVEF of 50-55%.    [name], M.D., F.A.C.C.

## 2013-11-23 NOTE — Assessment & Plan Note (Signed)
St. Jude prosthesis, stable by recent followup echocardiogram with normal gradients. Continue on Coumadin with followup in the Coumadin clinic.

## 2013-11-24 ENCOUNTER — Ambulatory Visit: Payer: BC Managed Care – PPO | Admitting: Internal Medicine

## 2013-11-25 ENCOUNTER — Ambulatory Visit: Payer: BC Managed Care – PPO | Admitting: Gastroenterology

## 2013-11-30 ENCOUNTER — Ambulatory Visit (INDEPENDENT_AMBULATORY_CARE_PROVIDER_SITE_OTHER): Payer: BC Managed Care – PPO | Admitting: *Deleted

## 2013-11-30 DIAGNOSIS — Z7901 Long term (current) use of anticoagulants: Secondary | ICD-10-CM

## 2013-11-30 DIAGNOSIS — Z5181 Encounter for therapeutic drug level monitoring: Secondary | ICD-10-CM

## 2013-11-30 DIAGNOSIS — I059 Rheumatic mitral valve disease, unspecified: Secondary | ICD-10-CM

## 2013-11-30 DIAGNOSIS — Z952 Presence of prosthetic heart valve: Secondary | ICD-10-CM

## 2013-11-30 DIAGNOSIS — Z954 Presence of other heart-valve replacement: Secondary | ICD-10-CM

## 2013-11-30 LAB — POCT INR: INR: 1.6

## 2013-12-02 ENCOUNTER — Ambulatory Visit (INDEPENDENT_AMBULATORY_CARE_PROVIDER_SITE_OTHER): Payer: BC Managed Care – PPO | Admitting: *Deleted

## 2013-12-02 DIAGNOSIS — I059 Rheumatic mitral valve disease, unspecified: Secondary | ICD-10-CM

## 2013-12-02 DIAGNOSIS — Z7901 Long term (current) use of anticoagulants: Secondary | ICD-10-CM | POA: Diagnosis not present

## 2013-12-02 DIAGNOSIS — Z954 Presence of other heart-valve replacement: Secondary | ICD-10-CM

## 2013-12-02 DIAGNOSIS — Z5181 Encounter for therapeutic drug level monitoring: Secondary | ICD-10-CM | POA: Diagnosis not present

## 2013-12-02 DIAGNOSIS — Z952 Presence of prosthetic heart valve: Secondary | ICD-10-CM

## 2013-12-02 LAB — POCT INR: INR: 3.4

## 2013-12-02 MED ORDER — WARFARIN SODIUM 5 MG PO TABS: ORAL_TABLET | ORAL | Status: DC

## 2013-12-04 ENCOUNTER — Other Ambulatory Visit: Payer: Self-pay

## 2013-12-09 ENCOUNTER — Ambulatory Visit (INDEPENDENT_AMBULATORY_CARE_PROVIDER_SITE_OTHER): Payer: BC Managed Care – PPO | Admitting: *Deleted

## 2013-12-09 DIAGNOSIS — Z7901 Long term (current) use of anticoagulants: Secondary | ICD-10-CM

## 2013-12-09 DIAGNOSIS — Z954 Presence of other heart-valve replacement: Secondary | ICD-10-CM | POA: Diagnosis not present

## 2013-12-09 DIAGNOSIS — I059 Rheumatic mitral valve disease, unspecified: Secondary | ICD-10-CM

## 2013-12-09 DIAGNOSIS — Z5181 Encounter for therapeutic drug level monitoring: Secondary | ICD-10-CM

## 2013-12-09 DIAGNOSIS — Z952 Presence of prosthetic heart valve: Secondary | ICD-10-CM

## 2013-12-09 LAB — POCT INR: INR: 5.9

## 2013-12-12 ENCOUNTER — Other Ambulatory Visit: Payer: Self-pay | Admitting: Nurse Practitioner

## 2013-12-14 ENCOUNTER — Ambulatory Visit (INDEPENDENT_AMBULATORY_CARE_PROVIDER_SITE_OTHER): Payer: BC Managed Care – PPO | Admitting: *Deleted

## 2013-12-14 DIAGNOSIS — Z5181 Encounter for therapeutic drug level monitoring: Secondary | ICD-10-CM | POA: Diagnosis not present

## 2013-12-14 DIAGNOSIS — Z954 Presence of other heart-valve replacement: Secondary | ICD-10-CM | POA: Diagnosis not present

## 2013-12-14 DIAGNOSIS — M329 Systemic lupus erythematosus, unspecified: Secondary | ICD-10-CM | POA: Diagnosis not present

## 2013-12-14 DIAGNOSIS — I059 Rheumatic mitral valve disease, unspecified: Secondary | ICD-10-CM

## 2013-12-14 DIAGNOSIS — Z952 Presence of prosthetic heart valve: Secondary | ICD-10-CM

## 2013-12-14 DIAGNOSIS — D6859 Other primary thrombophilia: Secondary | ICD-10-CM

## 2013-12-14 DIAGNOSIS — Z7901 Long term (current) use of anticoagulants: Secondary | ICD-10-CM | POA: Diagnosis not present

## 2013-12-14 DIAGNOSIS — D6852 Prothrombin gene mutation: Secondary | ICD-10-CM | POA: Diagnosis not present

## 2013-12-14 LAB — POCT INR: INR: 1.5

## 2013-12-15 ENCOUNTER — Other Ambulatory Visit (HOSPITAL_COMMUNITY): Payer: Self-pay | Admitting: Family Medicine

## 2013-12-15 DIAGNOSIS — Z1231 Encounter for screening mammogram for malignant neoplasm of breast: Secondary | ICD-10-CM

## 2013-12-18 DIAGNOSIS — E663 Overweight: Secondary | ICD-10-CM | POA: Diagnosis not present

## 2013-12-18 DIAGNOSIS — G894 Chronic pain syndrome: Secondary | ICD-10-CM | POA: Diagnosis not present

## 2013-12-18 DIAGNOSIS — J209 Acute bronchitis, unspecified: Secondary | ICD-10-CM | POA: Diagnosis not present

## 2013-12-18 DIAGNOSIS — D511 Vitamin B12 deficiency anemia due to selective vitamin B12 malabsorption with proteinuria: Secondary | ICD-10-CM | POA: Diagnosis not present

## 2013-12-18 DIAGNOSIS — Z6829 Body mass index (BMI) 29.0-29.9, adult: Secondary | ICD-10-CM | POA: Diagnosis not present

## 2013-12-21 ENCOUNTER — Ambulatory Visit (INDEPENDENT_AMBULATORY_CARE_PROVIDER_SITE_OTHER): Payer: BC Managed Care – PPO | Admitting: *Deleted

## 2013-12-21 ENCOUNTER — Encounter: Payer: Self-pay | Admitting: Cardiology

## 2013-12-21 DIAGNOSIS — Z5181 Encounter for therapeutic drug level monitoring: Secondary | ICD-10-CM

## 2013-12-21 DIAGNOSIS — D6859 Other primary thrombophilia: Secondary | ICD-10-CM

## 2013-12-21 DIAGNOSIS — D6852 Prothrombin gene mutation: Secondary | ICD-10-CM

## 2013-12-21 DIAGNOSIS — Z954 Presence of other heart-valve replacement: Secondary | ICD-10-CM

## 2013-12-21 DIAGNOSIS — M329 Systemic lupus erythematosus, unspecified: Secondary | ICD-10-CM

## 2013-12-21 DIAGNOSIS — Z952 Presence of prosthetic heart valve: Secondary | ICD-10-CM

## 2013-12-21 DIAGNOSIS — Z7901 Long term (current) use of anticoagulants: Secondary | ICD-10-CM

## 2013-12-21 LAB — POCT INR: INR: 2.4

## 2013-12-24 ENCOUNTER — Other Ambulatory Visit (HOSPITAL_COMMUNITY): Payer: BC Managed Care – PPO

## 2013-12-25 ENCOUNTER — Ambulatory Visit (HOSPITAL_COMMUNITY): Payer: BC Managed Care – PPO | Admitting: Oncology

## 2013-12-25 ENCOUNTER — Encounter: Payer: Self-pay | Admitting: Gastroenterology

## 2013-12-25 ENCOUNTER — Ambulatory Visit (INDEPENDENT_AMBULATORY_CARE_PROVIDER_SITE_OTHER): Payer: BC Managed Care – PPO | Admitting: Gastroenterology

## 2013-12-25 VITALS — BP 123/82 | HR 69 | Temp 98.5°F | Ht 64.0 in | Wt 171.0 lb

## 2013-12-25 DIAGNOSIS — K3184 Gastroparesis: Secondary | ICD-10-CM

## 2013-12-25 MED ORDER — METOCLOPRAMIDE HCL 5 MG/5ML PO SOLN: 10.0000 mg | Freq: Three times a day (TID) | ORAL | Status: DC

## 2013-12-25 NOTE — Patient Instructions (Addendum)
Continue Reglan three times a day for now. I will be speaking with Dr. Gala Romney about better options for you and be in touch shortly!! Please review the reflux diet attached.   We will see you in 6 weeks!  Food Choices for Gastroesophageal Reflux Disease When you have gastroesophageal reflux disease (GERD), the foods you eat and your eating habits are very important. Choosing the right foods can help ease the discomfort of GERD. WHAT GENERAL GUIDELINES DO I NEED TO FOLLOW?  Choose fruits, vegetables, whole grains, low-fat dairy products, and low-fat meat, fish, and poultry.  Limit fats such as oils, salad dressings, butter, nuts, and avocado.  Keep a food diary to identify foods that cause symptoms.  Avoid foods that cause reflux. These may be different for different people.  Eat frequent small meals instead of three large meals each day.  Eat your meals slowly, in a relaxed setting.  Limit fried foods.  Cook foods using methods other than frying.  Avoid drinking alcohol.  Avoid drinking large amounts of liquids with your meals.  Avoid bending over or lying down until 2-3 hours after eating. WHAT FOODS ARE NOT RECOMMENDED? The following are some foods and drinks that may worsen your symptoms: Vegetables Tomatoes. Tomato juice. Tomato and spaghetti sauce. Chili peppers. Onion and garlic. Horseradish. Fruits Oranges, grapefruit, and lemon (fruit and juice). Meats High-fat meats, fish, and poultry. This includes hot dogs, ribs, ham, sausage, salami, and bacon. Dairy Whole milk and chocolate milk. Sour cream. Cream. Butter. Ice cream. Cream cheese.  Beverages Coffee and tea, with or without caffeine. Carbonated beverages or energy drinks. Condiments Hot sauce. Barbecue sauce.  Sweets/Desserts Chocolate and cocoa. Donuts. Peppermint and spearmint. Fats and Oils High-fat foods, including [place] fries and potato chips. Other Vinegar. Strong spices, such as black pepper, white  pepper, red pepper, cayenne, curry powder, cloves, ginger, and chili powder. The items listed above may not be a complete list of foods and beverages to avoid. Contact your dietitian for more information. Document Released: 02/05/2005 Document Revised: 02/10/2013 Document Reviewed: 12/10/2012 [facility] Patient Information 2015 [place], [place]. This information is not intended to replace advice given to you by your health care provider. Make sure you discuss any questions you have with your health care provider.

## 2013-12-25 NOTE — Assessment & Plan Note (Signed)
Some improvement with Reglan 10 mg TID. Still with bloating and early satiety, but she has gained weight from last visit. Remains on Dexilant, which overall controls GERD symptoms. Not a candidate for erythromycin, as she is on Coumadin and the interaction could affect INR levels. Would rather avoid this medication. Zofran without much improvement in the past. Will discuss with Dr. Gala Romney possible other therapeutic measures to include Marinol. 6 week return.

## 2013-12-25 NOTE — Progress Notes (Signed)
Referring Provider: Sharilyn Sites, MD Primary Care Physician:  Purvis Kilts, MD  Primary GI: Dr. Gala Romney   Chief Complaint  Patient presents with  . Follow-up    Gastroparesis    HPI:   [name] NACHTIGAL presents today with history of erosive esophagitis, bloating, and nausea. GES revealing delayed gastric emptying. Started on low dose Reglan BID. Couldn't tell a huge difference in taking Reglan BID at last appt. Increased to TID with meals. Weight up 5 lbs since last seen.   Notes bloating lately and more reflux lately. Has a cold. Feels like Reglan liquid is better. Trying to stay away from Phenergan. Only as needed. Zofran without improvement in the past. This morning had some significant reflux, regurgitation. A lot of symptoms related to what she eats.   Past Medical History  Diagnosis Date  . SLE (systemic lupus erythematosus)   . BRCA2 negative   . BRCA1 negative   . 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 B12 deficiency   . Mitral valve disease, rheumatic     St. Jude prosthesis  . Raynaud disease   . Fibromyalgia   . Anxiety   . Peripheral neuropathy   . Anticoagulation goal of INR 2.5 to 3.5   . Breast cancer 2011    Left side  . Drug-induced hepatitis     States per her rheumatologist, transaminases elevated but normalized after drug removed for   . Bursitis of right knee     Septic bursitis    Past Surgical History  Procedure Laterality Date  . Mitral valve replacement    . Cholecystectomy    . Mastectomy    . Breast surgery    . Endometrial ablation  2008    She no longer has menses  . Insertion expander left breast  11/16/10  . Tissue expander placement  02/14/2011    Procedure: TISSUE EXPANDER;  Surgeon: Macon Large;  Location: [place];  Service: Plastics;  Laterality: Bilateral;  Left Breast Remove Tissue Expander Placement of Implant Breast Reconstruction  . Mastopexy  02/14/2011    Procedure: MASTOPEXY;  Surgeon:  Macon Large;  Location: [place];  Service: Plastics;  Laterality: Bilateral;  Right Breast Reduction   . Esophagogastroduodenoscopy N/A 03/25/2013    Dr. Raliegh Scarlet reflux esophagitis-likely source of patient's symptoms (patulous EG junction). Hiatal hernia otherwise normal    Current Outpatient Prescriptions  Medication Sig Dispense Refill  . ALPRAZolam (XANAX) 0.5 MG tablet Take 0.5 mg by mouth at bedtime.     . cyanocobalamin (,VITAMIN B-12,) 1000 MCG/ML injection Inject 1 mL (1,000 mcg total) into the muscle every 30 (thirty) days. 10 mL prn  . dexlansoprazole (DEXILANT) 60 MG capsule Take 1 capsule (60 mg total) by mouth daily. 31 capsule 11  . furosemide (LASIX) 40 MG tablet Take 1 tablet (40 mg total) by mouth daily. 90 tablet 3  . gabapentin (NEURONTIN) 600 MG tablet Take 600 mg by mouth 2 (two) times daily.     . hydroxychloroquine (PLAQUENIL) 200 MG tablet Take 200 mg by mouth 2 (two) times daily.      Marland Kitchen ibuprofen (ADVIL,MOTRIN) 200 MG tablet Take 800 mg by mouth every 6 (six) hours as needed for headache.    . levothyroxine (SYNTHROID, LEVOTHROID) 100 MCG tablet Take 100 mcg by mouth daily before breakfast.    . metoCLOPramide (REGLAN) 5 MG tablet Take 1 tablet (5 mg total) by mouth 3 (three) times daily before meals.  90 tablet 3  . oxyCODONE-acetaminophen (PERCOCET) 7.5-325 MG per tablet Take 1 tablet by mouth every 4 (four) hours as needed for pain.    . potassium chloride (KLOR-CON 10) 10 MEQ tablet Take one tablet daily as directed    . promethazine (PHENERGAN) 25 MG tablet Take 25 mg by mouth every 6 (six) hours as needed for nausea or vomiting.    Marland Kitchen rOPINIRole (REQUIP) 0.5 MG tablet Take 1 mg by mouth at bedtime.     Marland Kitchen UNABLE TO FIND L8000 Post-Surgical Bras-6  S0630 Silicone Breast Prosthesis-1  Dx: 174.9 Right Partial Mastectomy 1 each 0  . warfarin (COUMADIN) 5 MG tablet Take 2 tablets daily or as directed 70 tablet 3  . zolpidem (AMBIEN) 10 MG tablet Take 10 mg by  mouth at bedtime as needed for sleep.      No current facility-administered medications for this visit.    Allergies as of 12/25/2013  . (No Known Allergies)    Family History  Problem Relation Age of Onset  . Colon cancer Neg Hx     History   Social History  . Marital Status: Married    Spouse Name: N/A    Number of Children: 2  . Years of Education: N/A   Social History Main Topics  . Smoking status: Former Smoker -- 1.00 packs/day for 15 years    Types: Cigarettes    Quit date: 02/19/2006  . Smokeless tobacco: Never Used  . Alcohol Use: Yes     Comment: Occasional  . Drug Use: No  . Sexual Activity: No   Other Topics Concern  . None   Social History Narrative    Review of Systems: As mentioned in HPI.   Physical Exam: BP 123/82 mmHg  Pulse 69  Temp(Src) 98.5 F (36.9 C) (Oral)  Ht 5' 4"  (1.626 m)  Wt 171 lb (77.565 kg)  BMI 29.34 kg/m2 General:   Alert and oriented. No distress noted. Pleasant and cooperative.  Head:  Normocephalic and atraumatic. Eyes:  Conjuctiva clear without scleral icterus. Abdomen:  +BS, soft, non-tender and non-distended. No rebound or guarding. No HSM or masses noted. Msk:  Symmetrical without gross deformities. Normal posture. Extremities:  Without edema. Neurologic:  Alert and  oriented x4;  grossly normal neurologically. Psych:  Alert and cooperative. Normal mood and affect.

## 2013-12-28 NOTE — Progress Notes (Signed)
cc'ed to pcp °

## 2013-12-30 ENCOUNTER — Ambulatory Visit (INDEPENDENT_AMBULATORY_CARE_PROVIDER_SITE_OTHER): Payer: BC Managed Care – PPO | Admitting: *Deleted

## 2013-12-30 DIAGNOSIS — Z7901 Long term (current) use of anticoagulants: Secondary | ICD-10-CM

## 2013-12-30 DIAGNOSIS — Z5181 Encounter for therapeutic drug level monitoring: Secondary | ICD-10-CM | POA: Diagnosis not present

## 2013-12-30 DIAGNOSIS — Z954 Presence of other heart-valve replacement: Secondary | ICD-10-CM

## 2013-12-30 DIAGNOSIS — Z952 Presence of prosthetic heart valve: Secondary | ICD-10-CM

## 2013-12-30 DIAGNOSIS — D6852 Prothrombin gene mutation: Secondary | ICD-10-CM | POA: Diagnosis not present

## 2013-12-30 DIAGNOSIS — I059 Rheumatic mitral valve disease, unspecified: Secondary | ICD-10-CM | POA: Diagnosis not present

## 2013-12-30 DIAGNOSIS — M329 Systemic lupus erythematosus, unspecified: Secondary | ICD-10-CM

## 2013-12-30 DIAGNOSIS — D6859 Other primary thrombophilia: Secondary | ICD-10-CM

## 2013-12-30 LAB — POCT INR: INR: 3.5

## 2014-01-05 NOTE — Progress Notes (Signed)
Discussed with Dr. Gala Romney. Could try low dose Marinol. Would patient like to try this?

## 2014-01-06 ENCOUNTER — Other Ambulatory Visit: Payer: Self-pay | Admitting: Cardiology

## 2014-01-06 DIAGNOSIS — R0602 Shortness of breath: Secondary | ICD-10-CM

## 2014-01-06 MED ORDER — FUROSEMIDE 40 MG PO TABS: 40.0000 mg | ORAL_TABLET | Freq: Every day | ORAL | Status: DC

## 2014-01-06 NOTE — Telephone Encounter (Signed)
Medication sent to pharmacy  

## 2014-01-06 NOTE — Telephone Encounter (Signed)
Received fax refill request  Rx # [id number] Medication:  Furosedmide 40 mg tablet Qty 90 Sig:  Take one tablet by mouth once daily Physician:  Domenic Polite

## 2014-01-11 ENCOUNTER — Ambulatory Visit (HOSPITAL_COMMUNITY): Payer: BC Managed Care – PPO

## 2014-01-12 ENCOUNTER — Other Ambulatory Visit (HOSPITAL_COMMUNITY): Payer: Self-pay

## 2014-01-12 DIAGNOSIS — M329 Systemic lupus erythematosus, unspecified: Secondary | ICD-10-CM

## 2014-01-12 DIAGNOSIS — C50419 Malignant neoplasm of upper-outer quadrant of unspecified female breast: Secondary | ICD-10-CM

## 2014-01-12 DIAGNOSIS — Z1371 Encounter for nonprocreative screening for genetic disease carrier status: Secondary | ICD-10-CM

## 2014-01-13 ENCOUNTER — Ambulatory Visit (INDEPENDENT_AMBULATORY_CARE_PROVIDER_SITE_OTHER): Payer: BC Managed Care – PPO | Admitting: *Deleted

## 2014-01-13 DIAGNOSIS — Z952 Presence of prosthetic heart valve: Secondary | ICD-10-CM

## 2014-01-13 DIAGNOSIS — Z7901 Long term (current) use of anticoagulants: Secondary | ICD-10-CM

## 2014-01-13 DIAGNOSIS — I059 Rheumatic mitral valve disease, unspecified: Secondary | ICD-10-CM

## 2014-01-13 DIAGNOSIS — Z954 Presence of other heart-valve replacement: Secondary | ICD-10-CM | POA: Diagnosis not present

## 2014-01-13 DIAGNOSIS — Z5181 Encounter for therapeutic drug level monitoring: Secondary | ICD-10-CM | POA: Diagnosis not present

## 2014-01-13 LAB — POCT INR: INR: 3.9

## 2014-01-16 NOTE — Progress Notes (Signed)
-  No show-  KEFALAS,THOMAS  

## 2014-01-18 ENCOUNTER — Other Ambulatory Visit (HOSPITAL_COMMUNITY): Payer: BC Managed Care – PPO

## 2014-01-19 ENCOUNTER — Ambulatory Visit (HOSPITAL_COMMUNITY): Payer: BC Managed Care – PPO | Admitting: Oncology

## 2014-01-22 DIAGNOSIS — E663 Overweight: Secondary | ICD-10-CM | POA: Diagnosis not present

## 2014-01-22 DIAGNOSIS — D519 Vitamin B12 deficiency anemia, unspecified: Secondary | ICD-10-CM | POA: Diagnosis not present

## 2014-01-22 DIAGNOSIS — E538 Deficiency of other specified B group vitamins: Secondary | ICD-10-CM | POA: Diagnosis not present

## 2014-01-22 DIAGNOSIS — Z6829 Body mass index (BMI) 29.0-29.9, adult: Secondary | ICD-10-CM | POA: Diagnosis not present

## 2014-01-22 DIAGNOSIS — G894 Chronic pain syndrome: Secondary | ICD-10-CM | POA: Diagnosis not present

## 2014-01-25 ENCOUNTER — Ambulatory Visit (INDEPENDENT_AMBULATORY_CARE_PROVIDER_SITE_OTHER): Payer: BC Managed Care – PPO | Admitting: *Deleted

## 2014-01-25 ENCOUNTER — Encounter (HOSPITAL_COMMUNITY)
Admission: RE | Admit: 2014-01-25 | Discharge: 2014-01-25 | Disposition: A | Discharge status: Home / Self Care | Payer: BC Managed Care – PPO | Source: Ambulatory Visit | Attending: Rheumatology | Admitting: Rheumatology

## 2014-01-25 ENCOUNTER — Encounter (HOSPITAL_COMMUNITY): Payer: Self-pay

## 2014-01-25 ENCOUNTER — Other Ambulatory Visit (HOSPITAL_COMMUNITY): Payer: Self-pay | Admitting: Rheumatology

## 2014-01-25 DIAGNOSIS — Z7901 Long term (current) use of anticoagulants: Secondary | ICD-10-CM

## 2014-01-25 DIAGNOSIS — M069 Rheumatoid arthritis, unspecified: Secondary | ICD-10-CM | POA: Diagnosis not present

## 2014-01-25 DIAGNOSIS — Z5181 Encounter for therapeutic drug level monitoring: Secondary | ICD-10-CM | POA: Insufficient documentation

## 2014-01-25 DIAGNOSIS — I059 Rheumatic mitral valve disease, unspecified: Secondary | ICD-10-CM

## 2014-01-25 DIAGNOSIS — Z79899 Other long term (current) drug therapy: Secondary | ICD-10-CM | POA: Diagnosis not present

## 2014-01-25 DIAGNOSIS — Z952 Presence of prosthetic heart valve: Secondary | ICD-10-CM

## 2014-01-25 DIAGNOSIS — Z954 Presence of other heart-valve replacement: Secondary | ICD-10-CM

## 2014-01-25 LAB — POCT INR: INR: 3.7

## 2014-01-25 MED ORDER — SODIUM CHLORIDE 0.9 % IV SOLN
Freq: Every day | INTRAVENOUS | Status: DC
  Administered 2014-01-25: 500 mL via INTRAVENOUS

## 2014-01-25 MED ORDER — SODIUM CHLORIDE 0.9 % IV SOLN
1000.0000 mg | INTRAVENOUS | Status: DC
  Administered 2014-01-25: 1000 mg via INTRAVENOUS
  Filled 2014-01-25: qty 100

## 2014-01-25 MED ORDER — DIPHENHYDRAMINE HCL 50 MG/ML IJ SOLN
50.0000 mg | INTRAMUSCULAR | Status: AC
  Administered 2014-01-25: 50 mg via INTRAVENOUS

## 2014-01-25 MED ORDER — METHYLPREDNISOLONE SODIUM SUCC 125 MG IJ SOLR
100.0000 mg | INTRAMUSCULAR | Status: DC
  Administered 2014-01-25: 100 mg via INTRAVENOUS
  Filled 2014-01-25: qty 1.6

## 2014-01-25 MED ORDER — DIPHENHYDRAMINE HCL 50 MG/ML IJ SOLN
INTRAMUSCULAR | Status: AC
  Filled 2014-01-25: qty 1

## 2014-01-25 MED ORDER — ACETAMINOPHEN 325 MG PO TABS
650.0000 mg | ORAL_TABLET | ORAL | Status: DC
  Administered 2014-01-25: 650 mg via ORAL
  Filled 2014-01-25: qty 2

## 2014-01-25 NOTE — Discharge Instructions (Signed)
RITUXAN Rituximab injection What is this medicine? RITUXIMAB (ri TUX i mab) is a monoclonal antibody. This medicine changes the way the body's immune system works. It is used commonly to treat non-Hodgkin's lymphoma and other conditions. In cancer cells, this drug targets a specific protein within cancer cells and stops the cancer cells from growing. It is also used to treat rhuematoid arthritis (RA). In RA, this medicine slow the inflammatory process and help reduce joint pain and swelling. This medicine is often used with other cancer or arthritis medications. This medicine may be used for other purposes; ask your health care provider or pharmacist if you have questions. COMMON BRAND NAME(S): Rituxan What should I tell my health care provider before I take this medicine? They need to know if you have any of these conditions: -blood disorders -heart disease -history of hepatitis B -infection (especially a virus infection such as chickenpox, cold sores, or herpes) -irregular heartbeat -kidney disease -lung or breathing disease, like asthma -lupus -an unusual or allergic reaction to rituximab, mouse proteins, other medicines, foods, dyes, or preservatives -pregnant or trying to get pregnant -breast-feeding How should I use this medicine? This medicine is for infusion into a vein. It is administered in a hospital or clinic by a specially trained health care professional. A special MedGuide will be given to you by the pharmacist with each prescription and refill. Be sure to read this information carefully each time. Talk to your pediatrician regarding the use of this medicine in children. This medicine is not approved for use in children. Overdosage: If you think you have taken too much of this medicine contact a poison control center or emergency room at once. NOTE: This medicine is only for you. Do not share this medicine with others. What if I miss a dose? It is important not to miss a  dose. Call your doctor or health care professional if you are unable to keep an appointment. What may interact with this medicine? -cisplatin -medicines for blood pressure -some other medicines for arthritis -vaccines This list may not describe all possible interactions. Give your health care provider a list of all the medicines, herbs, non-prescription drugs, or dietary supplements you use. Also tell them if you smoke, drink alcohol, or use illegal drugs. Some items may interact with your medicine. What should I watch for while using this medicine? Report any side effects that you notice during your treatment right away, such as changes in your breathing, fever, chills, dizziness or lightheadedness. These effects are more common with the first dose. Visit your prescriber or health care professional for checks on your progress. You will need to have regular blood work. Report any other side effects. The side effects of this medicine can continue after you finish your treatment. Continue your course of treatment even though you feel ill unless your doctor tells you to stop. Call your doctor or health care professional for advice if you get a fever, chills or sore throat, or other symptoms of a cold or flu. Do not treat yourself. This drug decreases your body's ability to fight infections. Try to avoid being around people who are sick. This medicine may increase your risk to bruise or bleed. Call your doctor or health care professional if you notice any unusual bleeding. Be careful brushing and flossing your teeth or using a toothpick because you may get an infection or bleed more easily. If you have any dental work done, tell your dentist you are receiving this medicine. Avoid taking  products that contain aspirin, acetaminophen, ibuprofen, naproxen, or ketoprofen unless instructed by your doctor. These medicines may hide a fever. Do not become pregnant while taking this medicine. Women should inform  their doctor if they wish to become pregnant or think they might be pregnant. There is a potential for serious side effects to an unborn child. Talk to your health care professional or pharmacist for more information. Do not breast-feed an infant while taking this medicine. What side effects may I notice from receiving this medicine? Side effects that you should report to your doctor or health care professional as soon as possible: -allergic reactions like skin rash, itching or hives, swelling of the face, lips, or tongue -low blood counts - this medicine may decrease the number of white blood cells, red blood cells and platelets. You may be at increased risk for infections and bleeding. -signs of infection - fever or chills, cough, sore throat, pain or difficulty passing urine -signs of decreased platelets or bleeding - bruising, pinpoint red spots on the skin, black, tarry stools, blood in the urine -signs of decreased red blood cells - unusually weak or tired, fainting spells, lightheadedness -breathing problems -confused, not responsive -chest pain -fast, irregular heartbeat -feeling faint or lightheaded, falls -mouth sores -redness, blistering, peeling or loosening of the skin, including inside the mouth -stomach pain -swelling of the ankles, feet, or hands -trouble passing urine or change in the amount of urine Side effects that usually do not require medical attention (report to your doctor or other health care professional if they continue or are bothersome): -anxiety -headache -loss of appetite -muscle aches -nausea -night sweats This list may not describe all possible side effects. Call your doctor for medical advice about side effects. You may report side effects to FDA at 1-800-FDA-1088. Where should I keep my medicine? This drug is given in a hospital or clinic and will not be stored at home. NOTE: This sheet is a summary. It may not cover all possible information. If you  have questions about this medicine, talk to your doctor, pharmacist, or health care provider.  2015, Elsevier/Gold Standard. (2007-10-06 14:04:59) Rheumatoid Arthritis Rheumatoid arthritis is a long-term (chronic) inflammatory disease that causes pain, swelling, and stiffness of the joints. It can affect the entire body, including the eyes and lungs. The effects of rheumatoid arthritis vary widely among those with the condition. CAUSES  The cause of rheumatoid arthritis is not known. It tends to run in families and is more common in women. Certain cells of the body's natural defense system (immune system) do not work properly and begin to attack healthy joints. It primarily involves the connective tissue that lines the joints (synovial membrane). This can cause damage to the joint. SYMPTOMS   Pain, stiffness, swelling, and decreased motion of many joints, especially in the hands and feet.  Stiffness that is worse in the morning. It may last 1-2 hours or longer.  Numbness and tingling in the hands.  Fatigue.  Loss of appetite.  Weight loss.  Low-grade fever.  Dry eyes and mouth.  Firm lumps (rheumatoid nodules) that grow beneath the skin in areas such as the elbows and hands. DIAGNOSIS  Diagnosis is based on the symptoms described, an exam, and blood tests. Sometimes, X-rays are helpful. TREATMENT  The goals of treatment are to relieve pain, reduce inflammation, and to slow down or stop joint damage and disability. Methods vary and may include:  Maintaining a balance of rest, exercise, and proper nutrition.  Medicines:  Pain relievers (analgesics).  Corticosteroids and nonsteroidal anti-inflammatory drugs (NSAIDs) to reduce inflammation.  Disease-modifying antirheumatic drugs (DMARDs) to try to slow the course of the disease.  Biologic response modifiers to reduce inflammation and damage.  Physical therapy and occupational therapy.  Surgery for patients with severe joint  damage. Joint replacement or fusing of joints may be needed.  Routine monitoring and ongoing care, such as office visits, blood and urine tests, and X-rays. HOME CARE INSTRUCTIONS   Remain physically active and reduce activity when the disease gets worse.  Eat a well-balanced diet.  Put heat on affected joints when you wake up and before activities. Keep the heat on the affected joint for as long as directed by your health care provider.  Put ice on affected joints following activities or exercising.  Put ice in a plastic bag.  Place a towel between your skin and the bag.  Leave the ice on for 15-20 minutes, 3-4 times per day, or as directed by your health care provider.  Take medicines and supplements only as directed by your health care provider.  Use splints as directed by your health care provider. Splints help maintain joint position and function.  Do not sleep with pillows under your knees. This may lead to spasms.  Participate in a self-management program to keep current with the latest treatment and coping skills. SEEK IMMEDIATE MEDICAL CARE IF:  You have fainting episodes.  You have periods of extreme weakness.  You rapidly develop a hot, painful joint that is more severe than usual joint aches.  You have chills.  You have a fever. FOR MORE INFORMATION   American College of Rheumatology: www.rheumatology.[place]: www.arthritis.org Document Released: 02/03/2000 Document Revised: 06/22/2013 Document Reviewed: 03/14/2011 [facility] Patient Information 2015 [place], [place]. This information is not intended to replace advice given to you by your health care provider. Make sure you discuss any questions you have with your health care provider.

## 2014-01-25 NOTE — Progress Notes (Addendum)
As I was checking patient for her ramp up Rituxan to 200mg /hr at 0950 pt states her throat feels itchy and tongue feels tingly. Rituxan was stopped and saline at [facility] resumed  Pt had received a total of 61cc as it was started at 0825 and ramped up every 30 minutes. Placed call to Dr Trudie Reed office to report this and spoke with Caryl Pina. She will report this to Dr Trudie Reed and call me back. Pt states the itching of throat and itching of tongue remains the same. Orders received to give patient Benadryl and to resume infusion

## 2014-01-25 NOTE — Progress Notes (Signed)
10 minutes after Benadryl 50 mg IV given. Pt states the throat itching and tongue tingling has completely resolved. Rituxan resumed at 100mg /hr to be ramped up in 30 minutes

## 2014-01-25 NOTE — Progress Notes (Signed)
Was able to ramp up the Rituxan infusion every 30 minutes to the maximum of 400mg /hr and patient had no further c/o of throat itching or tongue tingling. Pt is scheduled for her #2/2 infusion on 02/08/14. Pt was discharged ambulatory accompanied by mother-in-law to back door of short stay

## 2014-02-05 ENCOUNTER — Ambulatory Visit: Payer: BC Managed Care – PPO | Admitting: Gastroenterology

## 2014-02-08 ENCOUNTER — Ambulatory Visit (INDEPENDENT_AMBULATORY_CARE_PROVIDER_SITE_OTHER): Payer: BC Managed Care – PPO | Admitting: *Deleted

## 2014-02-08 ENCOUNTER — Other Ambulatory Visit (HOSPITAL_COMMUNITY): Payer: Self-pay | Admitting: Rheumatology

## 2014-02-08 ENCOUNTER — Encounter (HOSPITAL_COMMUNITY)
Admission: RE | Admit: 2014-02-08 | Discharge: 2014-02-08 | Disposition: A | Discharge status: Home / Self Care | Payer: BC Managed Care – PPO | Source: Ambulatory Visit | Attending: Rheumatology | Admitting: Rheumatology

## 2014-02-08 ENCOUNTER — Encounter (HOSPITAL_COMMUNITY): Payer: Self-pay

## 2014-02-08 DIAGNOSIS — I059 Rheumatic mitral valve disease, unspecified: Secondary | ICD-10-CM

## 2014-02-08 DIAGNOSIS — Z952 Presence of prosthetic heart valve: Secondary | ICD-10-CM

## 2014-02-08 DIAGNOSIS — Z7901 Long term (current) use of anticoagulants: Secondary | ICD-10-CM

## 2014-02-08 DIAGNOSIS — Z954 Presence of other heart-valve replacement: Secondary | ICD-10-CM

## 2014-02-08 DIAGNOSIS — Z5181 Encounter for therapeutic drug level monitoring: Secondary | ICD-10-CM

## 2014-02-08 DIAGNOSIS — M069 Rheumatoid arthritis, unspecified: Secondary | ICD-10-CM | POA: Diagnosis not present

## 2014-02-08 LAB — POCT INR: INR: 5.4

## 2014-02-08 MED ORDER — SODIUM CHLORIDE 0.9 % IV SOLN
1000.0000 mg | Freq: Once | INTRAVENOUS | Status: AC
  Administered 2014-02-08: 1000 mg via INTRAVENOUS
  Filled 2014-02-08: qty 100

## 2014-02-08 MED ORDER — SODIUM CHLORIDE 0.9 % IV SOLN
1000.0000 mg | INTRAVENOUS | Status: DC
  Filled 2014-02-08: qty 100

## 2014-02-08 MED ORDER — METHYLPREDNISOLONE SODIUM SUCC 125 MG IJ SOLR
100.0000 mg | INTRAMUSCULAR | Status: AC
  Administered 2014-02-08: 100 mg via INTRAVENOUS
  Filled 2014-02-08 (×2): qty 1.6

## 2014-02-08 MED ORDER — SODIUM CHLORIDE 0.9 % IV SOLN
Freq: Every day | INTRAVENOUS | Status: AC
  Administered 2014-02-08: 500 mL via INTRAVENOUS

## 2014-02-08 MED ORDER — ACETAMINOPHEN 325 MG PO TABS
650.0000 mg | ORAL_TABLET | ORAL | Status: AC
  Administered 2014-02-08: 650 mg via ORAL
  Filled 2014-02-08: qty 2

## 2014-02-08 MED ORDER — DIPHENHYDRAMINE HCL 25 MG PO CAPS
50.0000 mg | ORAL_CAPSULE | Freq: Once | ORAL | Status: AC
  Administered 2014-02-08: 50 mg via ORAL
  Filled 2014-02-08: qty 2

## 2014-02-08 NOTE — Progress Notes (Addendum)
Rituxan infusion , unable to chart RAMP-UP schedule on MAR but is increased at 50 mg/hr every 30 minutes until maximum of 400 mg. This is increased to 100mg /hr infusing at 44ml/hr

## 2014-02-08 NOTE — Progress Notes (Signed)
Rituxan increase to 200mg /hr at a rate of 52ml/hr x 30 minutes

## 2014-02-08 NOTE — Progress Notes (Signed)
Rituxan increased to 300mg /hr at a rate of 155ml/hr x 30 minutes

## 2014-02-08 NOTE — Progress Notes (Signed)
Rituxan increased to 150mg /hr at a rate of 52.5cc/hr x 30 minutes

## 2014-02-08 NOTE — Progress Notes (Signed)
Uneventful infusion of the 2nd day of Rituxan in the current orders. Pt was discharged ambulatory accompanied by daughter to back door of short stay

## 2014-02-08 NOTE — Progress Notes (Signed)
Rituxan increased to 250mg /hr at a rate of 87.5 ml/hr x 30 minutes

## 2014-02-08 NOTE — Progress Notes (Signed)
Rituxan increased to 350mg /hr at a rate of 145ml/hr x 30 minutes

## 2014-02-08 NOTE — Progress Notes (Signed)
Rituxan increased to maximum of 400mg /hr at a rated of 160ml/hr to infuse for the remaining of the infusion

## 2014-02-17 ENCOUNTER — Encounter: Payer: Self-pay | Admitting: *Deleted

## 2014-02-22 ENCOUNTER — Ambulatory Visit (INDEPENDENT_AMBULATORY_CARE_PROVIDER_SITE_OTHER): Payer: BLUE CROSS/BLUE SHIELD | Admitting: *Deleted

## 2014-02-22 DIAGNOSIS — Z5181 Encounter for therapeutic drug level monitoring: Secondary | ICD-10-CM | POA: Diagnosis not present

## 2014-02-22 DIAGNOSIS — Z7901 Long term (current) use of anticoagulants: Secondary | ICD-10-CM

## 2014-02-22 DIAGNOSIS — Z952 Presence of prosthetic heart valve: Secondary | ICD-10-CM

## 2014-02-22 DIAGNOSIS — Z954 Presence of other heart-valve replacement: Secondary | ICD-10-CM

## 2014-02-22 DIAGNOSIS — I059 Rheumatic mitral valve disease, unspecified: Secondary | ICD-10-CM

## 2014-02-22 LAB — POCT INR: INR: 4.6

## 2014-03-02 DIAGNOSIS — E663 Overweight: Secondary | ICD-10-CM | POA: Diagnosis not present

## 2014-03-02 DIAGNOSIS — G894 Chronic pain syndrome: Secondary | ICD-10-CM | POA: Diagnosis not present

## 2014-03-02 DIAGNOSIS — E538 Deficiency of other specified B group vitamins: Secondary | ICD-10-CM | POA: Diagnosis not present

## 2014-03-02 DIAGNOSIS — Z6828 Body mass index (BMI) 28.0-28.9, adult: Secondary | ICD-10-CM | POA: Diagnosis not present

## 2014-03-02 DIAGNOSIS — D511 Vitamin B12 deficiency anemia due to selective vitamin B12 malabsorption with proteinuria: Secondary | ICD-10-CM | POA: Diagnosis not present

## 2014-03-08 ENCOUNTER — Ambulatory Visit (INDEPENDENT_AMBULATORY_CARE_PROVIDER_SITE_OTHER): Payer: BLUE CROSS/BLUE SHIELD | Admitting: *Deleted

## 2014-03-08 DIAGNOSIS — Z5181 Encounter for therapeutic drug level monitoring: Secondary | ICD-10-CM | POA: Diagnosis not present

## 2014-03-08 DIAGNOSIS — Z954 Presence of other heart-valve replacement: Secondary | ICD-10-CM | POA: Diagnosis not present

## 2014-03-08 DIAGNOSIS — Z7901 Long term (current) use of anticoagulants: Secondary | ICD-10-CM | POA: Diagnosis not present

## 2014-03-08 DIAGNOSIS — Z952 Presence of prosthetic heart valve: Secondary | ICD-10-CM

## 2014-03-08 DIAGNOSIS — I059 Rheumatic mitral valve disease, unspecified: Secondary | ICD-10-CM | POA: Diagnosis not present

## 2014-03-08 LAB — POCT INR: INR: 6.8

## 2014-03-15 ENCOUNTER — Ambulatory Visit (INDEPENDENT_AMBULATORY_CARE_PROVIDER_SITE_OTHER): Payer: BLUE CROSS/BLUE SHIELD | Admitting: *Deleted

## 2014-03-15 DIAGNOSIS — Z954 Presence of other heart-valve replacement: Secondary | ICD-10-CM | POA: Diagnosis not present

## 2014-03-15 DIAGNOSIS — Z7901 Long term (current) use of anticoagulants: Secondary | ICD-10-CM | POA: Diagnosis not present

## 2014-03-15 DIAGNOSIS — Z952 Presence of prosthetic heart valve: Secondary | ICD-10-CM

## 2014-03-15 DIAGNOSIS — Z5181 Encounter for therapeutic drug level monitoring: Secondary | ICD-10-CM | POA: Diagnosis not present

## 2014-03-15 DIAGNOSIS — I059 Rheumatic mitral valve disease, unspecified: Secondary | ICD-10-CM | POA: Diagnosis not present

## 2014-03-15 LAB — POCT INR: INR: 2.3

## 2014-03-17 ENCOUNTER — Ambulatory Visit (HOSPITAL_COMMUNITY)
Admission: RE | Admit: 2014-03-17 | Discharge: 2014-03-17 | Disposition: A | Discharge status: Home / Self Care | Payer: BLUE CROSS/BLUE SHIELD | Source: Ambulatory Visit | Attending: Family Medicine | Admitting: Family Medicine

## 2014-03-17 DIAGNOSIS — Z1231 Encounter for screening mammogram for malignant neoplasm of breast: Secondary | ICD-10-CM | POA: Diagnosis not present

## 2014-03-22 ENCOUNTER — Ambulatory Visit (INDEPENDENT_AMBULATORY_CARE_PROVIDER_SITE_OTHER): Payer: BLUE CROSS/BLUE SHIELD | Admitting: Gastroenterology

## 2014-03-22 ENCOUNTER — Encounter: Payer: Self-pay | Admitting: Gastroenterology

## 2014-03-22 VITALS — BP 118/81 | HR 81 | Temp 97.6°F | Ht 64.0 in | Wt 170.6 lb

## 2014-03-22 DIAGNOSIS — K3184 Gastroparesis: Secondary | ICD-10-CM

## 2014-03-22 MED ORDER — DRONABINOL 2.5 MG PO CAPS: 2.5000 mg | ORAL_CAPSULE | Freq: Two times a day (BID) | ORAL | Status: DC

## 2014-03-22 NOTE — Assessment & Plan Note (Signed)
44 year old with known erosive esophagitis and gastroparesis, overall doing well and at baseline. Discussed avoidance of fried/fatty foods, dietary and behavior modification. As Reglan and Zofran have not offered much improvement, trial Marinol 2.5 mg po BID. Unable to utilize erythromycin due to interaction with Coumadin. Return in 3 months. Continue PPI.

## 2014-03-22 NOTE — Progress Notes (Signed)
Referring Provider: Sharilyn Sites, MD Primary Care Physician:  Purvis Kilts, MD  Primary GI: Dr. Gala Romney   Chief Complaint  Patient presents with  . Gastrophageal Reflux    follow up    HPI:   [name] is a 44 y.o. female presenting today with a history of erosive esophagitis, bloating, nausea, delayed gastric emptying on GES.   Has good days and bad days. Stress worsens symptoms. Sometimes feels like Reglan helps sometimes not. Zofran not helpful. Notes when going out to eat has worsening of symptoms. Worse with fatty/fried foods. Does well if makes food at home.   Past Medical History  Diagnosis Date  . SLE (systemic lupus erythematosus)   . BRCA2 negative   . BRCA1 negative   . 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 B12 deficiency   . Mitral valve disease, rheumatic     [employer] Jude prosthesis  . Raynaud disease   . Fibromyalgia   . Anxiety   . Peripheral neuropathy   . Anticoagulation goal of INR 2.5 to 3.5   . Breast cancer 2011    Left side  . Drug-induced hepatitis     States per her rheumatologist, transaminases elevated but normalized after drug removed for   . Bursitis of right knee     Septic bursitis    Past Surgical History  Procedure Laterality Date  . Mitral valve replacement    . Cholecystectomy    . Mastectomy    . Breast surgery    . Endometrial ablation  2008    She no longer has menses  . Insertion expander left breast  11/16/10  . Tissue expander placement  02/14/2011    Procedure: TISSUE EXPANDER;  Surgeon: Macon Large;  Location: [place];  Service: Plastics;  Laterality: Bilateral;  Left Breast Remove Tissue Expander Placement of Implant Breast Reconstruction  . Mastopexy  02/14/2011    Procedure: MASTOPEXY;  Surgeon: Macon Large;  Location: [facility];  Service: Plastics;  Laterality: Bilateral;  Right Breast Reduction   . Esophagogastroduodenoscopy N/A 03/25/2013    Dr. Raliegh Scarlet reflux  esophagitis-likely source of patient's symptoms (patulous EG junction). Hiatal hernia otherwise normal    Current Outpatient Prescriptions  Medication Sig Dispense Refill  . ALPRAZolam (XANAX) 0.5 MG tablet Take 0.5 mg by mouth at bedtime.     . cyanocobalamin (,VITAMIN B-12,) 1000 MCG/ML injection Inject 1 mL (1,000 mcg total) into the muscle every 30 (thirty) days. 10 mL prn  . dexlansoprazole (DEXILANT) 60 MG capsule Take 1 capsule (60 mg total) by mouth daily. 31 capsule 11  . furosemide (LASIX) 40 MG tablet Take 1 tablet (40 mg total) by mouth daily. 90 tablet 3  . gabapentin (NEURONTIN) 600 MG tablet Take 600 mg by mouth 2 (two) times daily.     . hydroxychloroquine (PLAQUENIL) 200 MG tablet Take 200 mg by mouth 2 (two) times daily.      Marland Kitchen ibuprofen (ADVIL,MOTRIN) 200 MG tablet Take 800 mg by mouth every 6 (six) hours as needed for headache.    . levothyroxine (SYNTHROID, LEVOTHROID) 100 MCG tablet Take 100 mcg by mouth daily before breakfast.    . metoCLOPramide (REGLAN) 5 MG/5ML solution Take 10 mLs (10 mg total) by mouth 4 (four) times daily -  before meals and at bedtime. 240 mL 3  . oxyCODONE-acetaminophen (PERCOCET) 7.5-325 MG per tablet Take 1 tablet by mouth every 4 (four) hours as needed for pain.    Marland Kitchen  potassium chloride (KLOR-CON 10) 10 MEQ tablet Take one tablet daily as directed    . promethazine (PHENERGAN) 25 MG tablet Take 25 mg by mouth every 6 (six) hours as needed for nausea or vomiting.    . riTUXimab (RITUXAN) 100 MG/10ML injection Inject into the vein. Every 6 months    . rOPINIRole (REQUIP) 0.5 MG tablet Take 1 mg by mouth at bedtime.     Marland Kitchen warfarin (COUMADIN) 5 MG tablet Take 2 tablets daily or as directed 70 tablet 3  . zolpidem (AMBIEN) 10 MG tablet Take 10 mg by mouth at bedtime as needed for sleep.     Marland Kitchen UNABLE TO FIND L8000 Post-Surgical Bras-6  [id number] Silicone Breast Prosthesis-1  Dx: 174.9 Right Partial Mastectomy (Patient not taking: Reported on 03/22/2014)  1 each 0   No current facility-administered medications for this visit.    Allergies as of 03/22/2014  . (No Known Allergies)    Family History  Problem Relation Age of Onset  . Colon cancer Neg Hx     History   Social History  . Marital Status: Married    Spouse Name: N/A    Number of Children: 2  . Years of Education: N/A   Social History Main Topics  . Smoking status: Former Smoker -- 1.00 packs/day for 15 years    Types: Cigarettes    Quit date: 02/19/2006  . Smokeless tobacco: Never Used  . Alcohol Use: Yes     Comment: Occasional  . Drug Use: No  . Sexual Activity: No   Other Topics Concern  . None   Social History Narrative    Review of Systems: As mentioned in HPI.   Physical Exam: BP 118/81 mmHg  Pulse 81  Temp(Src) 97.6 F (36.4 C)  Ht 5' 4"  (1.626 m)  Wt 170 lb 9.6 oz (77.384 kg)  BMI 29.27 kg/m2 General:   Alert and oriented. No distress noted. Pleasant and cooperative.  Head:  Normocephalic and atraumatic. Abdomen:  +BS, soft, non-tender and non-distended. No rebound or guarding. No HSM or masses noted. Msk:  Symmetrical without gross deformities. Normal posture. Extremities:  Without edema. Neurologic:  Alert and  oriented x4;  grossly normal neurologically. Psych:  Alert and cooperative. Normal mood and affect.

## 2014-03-22 NOTE — Patient Instructions (Signed)
Continue Dexilant once daily.   Start Marinol 1 capsule before meals twice a day. Start weaning down the amount of Reglan you take until you are completely off.   We will see you in 3 months! Call me if any problems at all. You can also email through "[email or web address]".

## 2014-03-23 NOTE — Progress Notes (Signed)
cc'ed to pcp °

## 2014-03-29 ENCOUNTER — Ambulatory Visit (INDEPENDENT_AMBULATORY_CARE_PROVIDER_SITE_OTHER): Payer: BLUE CROSS/BLUE SHIELD | Admitting: *Deleted

## 2014-03-29 DIAGNOSIS — Z954 Presence of other heart-valve replacement: Secondary | ICD-10-CM

## 2014-03-29 DIAGNOSIS — Z7901 Long term (current) use of anticoagulants: Secondary | ICD-10-CM

## 2014-03-29 DIAGNOSIS — Z5181 Encounter for therapeutic drug level monitoring: Secondary | ICD-10-CM

## 2014-03-29 DIAGNOSIS — Z952 Presence of prosthetic heart valve: Secondary | ICD-10-CM

## 2014-03-29 DIAGNOSIS — I059 Rheumatic mitral valve disease, unspecified: Secondary | ICD-10-CM | POA: Diagnosis not present

## 2014-03-29 LAB — POCT INR: INR: 4.7

## 2014-03-30 DIAGNOSIS — G894 Chronic pain syndrome: Secondary | ICD-10-CM | POA: Diagnosis not present

## 2014-03-30 DIAGNOSIS — D519 Vitamin B12 deficiency anemia, unspecified: Secondary | ICD-10-CM | POA: Diagnosis not present

## 2014-03-30 DIAGNOSIS — E663 Overweight: Secondary | ICD-10-CM | POA: Diagnosis not present

## 2014-03-30 DIAGNOSIS — Z6829 Body mass index (BMI) 29.0-29.9, adult: Secondary | ICD-10-CM | POA: Diagnosis not present

## 2014-04-07 ENCOUNTER — Ambulatory Visit (INDEPENDENT_AMBULATORY_CARE_PROVIDER_SITE_OTHER): Payer: BLUE CROSS/BLUE SHIELD | Admitting: *Deleted

## 2014-04-07 DIAGNOSIS — I059 Rheumatic mitral valve disease, unspecified: Secondary | ICD-10-CM

## 2014-04-07 DIAGNOSIS — Z7901 Long term (current) use of anticoagulants: Secondary | ICD-10-CM

## 2014-04-07 DIAGNOSIS — Z5181 Encounter for therapeutic drug level monitoring: Secondary | ICD-10-CM

## 2014-04-07 DIAGNOSIS — Z954 Presence of other heart-valve replacement: Secondary | ICD-10-CM | POA: Diagnosis not present

## 2014-04-07 DIAGNOSIS — Z952 Presence of prosthetic heart valve: Secondary | ICD-10-CM

## 2014-04-07 LAB — POCT INR: INR: 4.9

## 2014-04-08 ENCOUNTER — Other Ambulatory Visit: Payer: Self-pay | Admitting: *Deleted

## 2014-04-08 ENCOUNTER — Telehealth: Payer: Self-pay | Admitting: *Deleted

## 2014-04-08 MED ORDER — ENOXAPARIN SODIUM 80 MG/0.8ML ~~LOC~~ SOLN: 80.0000 mg | Freq: Two times a day (BID) | SUBCUTANEOUS | Status: DC

## 2014-04-08 NOTE — Telephone Encounter (Signed)
Pt is scheduled for back injections on 04/16/14 at [facility].  Pt needs to come off coumadin 5 days prior to procedure and be bridged with Lovenox.  Ok per Dr Domenic Polite.  Pt informed.  2/20  Take last dose of coumadin 2/21  No lovenox or coumadin 2/22  Lovenox 80mg  sq BID 8am & 8pm 2/23  Lovenox 80mg  sq BID 8am & 8pm 2/24  Lovenox 80mg  sq BID 8am & 8pm 2/25  Lovenox 80mg  sq 7am only.  No lovenox in pm 2/26  No lovenox----procedure-----Lovenox 80mg  pm & coumadin 10mg  2/27  Lovenox 80mg  sq 8am and lovenox 80mg  sq 8pm & coumadin 10mg  2/28  Lovenox 80mg  sq 8am and lovenox 80mg  sq 8pm & coumadin 10mg  2/29  Lovenox 80mg  sq 8am and lovenox 80mg  sq 8pm & coumadin 7.5mg  3/01  Lovenox 80mg  sq 8am and lovenox 80mg  sq 8pm & coumadin 5mg  3/02  Lovenox 80mg  sq 8am ------INR appt 1:20pm  Lovenox 80mg  syringes #10 x 1 refill sent to [facility]  (Pt has 7 syringes at home)  Wt. 77.4 kg SrCr  0.77 CrCl  112.74  Edrick Oh, RN Anticoagulation Clinic  CC: Faxed to [facility] @ [employer]

## 2014-04-16 DIAGNOSIS — M47816 Spondylosis without myelopathy or radiculopathy, lumbar region: Secondary | ICD-10-CM | POA: Diagnosis not present

## 2014-04-19 DIAGNOSIS — D594 Other nonautoimmune hemolytic anemias: Secondary | ICD-10-CM | POA: Diagnosis not present

## 2014-04-19 DIAGNOSIS — C50412 Malignant neoplasm of upper-outer quadrant of left female breast: Secondary | ICD-10-CM | POA: Diagnosis not present

## 2014-04-21 ENCOUNTER — Ambulatory Visit (INDEPENDENT_AMBULATORY_CARE_PROVIDER_SITE_OTHER): Payer: BLUE CROSS/BLUE SHIELD | Admitting: *Deleted

## 2014-04-21 DIAGNOSIS — I059 Rheumatic mitral valve disease, unspecified: Secondary | ICD-10-CM

## 2014-04-21 DIAGNOSIS — Z954 Presence of other heart-valve replacement: Secondary | ICD-10-CM | POA: Diagnosis not present

## 2014-04-21 DIAGNOSIS — Z5181 Encounter for therapeutic drug level monitoring: Secondary | ICD-10-CM | POA: Diagnosis not present

## 2014-04-21 DIAGNOSIS — Z7901 Long term (current) use of anticoagulants: Secondary | ICD-10-CM | POA: Diagnosis not present

## 2014-04-21 DIAGNOSIS — Z952 Presence of prosthetic heart valve: Secondary | ICD-10-CM

## 2014-04-21 LAB — POCT INR: INR: 2.9

## 2014-05-10 ENCOUNTER — Ambulatory Visit (INDEPENDENT_AMBULATORY_CARE_PROVIDER_SITE_OTHER): Payer: BLUE CROSS/BLUE SHIELD | Admitting: *Deleted

## 2014-05-10 DIAGNOSIS — I059 Rheumatic mitral valve disease, unspecified: Secondary | ICD-10-CM | POA: Diagnosis not present

## 2014-05-10 DIAGNOSIS — Z5181 Encounter for therapeutic drug level monitoring: Secondary | ICD-10-CM | POA: Diagnosis not present

## 2014-05-10 DIAGNOSIS — Z954 Presence of other heart-valve replacement: Secondary | ICD-10-CM | POA: Diagnosis not present

## 2014-05-10 DIAGNOSIS — G894 Chronic pain syndrome: Secondary | ICD-10-CM | POA: Diagnosis not present

## 2014-05-10 DIAGNOSIS — Z6828 Body mass index (BMI) 28.0-28.9, adult: Secondary | ICD-10-CM | POA: Diagnosis not present

## 2014-05-10 DIAGNOSIS — D519 Vitamin B12 deficiency anemia, unspecified: Secondary | ICD-10-CM | POA: Diagnosis not present

## 2014-05-10 DIAGNOSIS — Z952 Presence of prosthetic heart valve: Secondary | ICD-10-CM

## 2014-05-10 DIAGNOSIS — Z7901 Long term (current) use of anticoagulants: Secondary | ICD-10-CM

## 2014-05-10 LAB — POCT INR: INR: 3.6

## 2014-05-11 DIAGNOSIS — D594 Other nonautoimmune hemolytic anemias: Secondary | ICD-10-CM | POA: Diagnosis not present

## 2014-05-26 ENCOUNTER — Ambulatory Visit (INDEPENDENT_AMBULATORY_CARE_PROVIDER_SITE_OTHER): Payer: BLUE CROSS/BLUE SHIELD | Admitting: Cardiology

## 2014-05-26 ENCOUNTER — Ambulatory Visit (INDEPENDENT_AMBULATORY_CARE_PROVIDER_SITE_OTHER): Payer: BLUE CROSS/BLUE SHIELD | Admitting: *Deleted

## 2014-05-26 ENCOUNTER — Encounter: Payer: Self-pay | Admitting: Cardiology

## 2014-05-26 VITALS — BP 124/84 | HR 64 | Ht 64.0 in | Wt 170.0 lb

## 2014-05-26 DIAGNOSIS — Z5181 Encounter for therapeutic drug level monitoring: Secondary | ICD-10-CM

## 2014-05-26 DIAGNOSIS — Z954 Presence of other heart-valve replacement: Secondary | ICD-10-CM | POA: Diagnosis not present

## 2014-05-26 DIAGNOSIS — Z7901 Long term (current) use of anticoagulants: Secondary | ICD-10-CM | POA: Diagnosis not present

## 2014-05-26 DIAGNOSIS — Z79899 Other long term (current) drug therapy: Secondary | ICD-10-CM

## 2014-05-26 DIAGNOSIS — M329 Systemic lupus erythematosus, unspecified: Secondary | ICD-10-CM | POA: Diagnosis not present

## 2014-05-26 DIAGNOSIS — Z952 Presence of prosthetic heart valve: Secondary | ICD-10-CM

## 2014-05-26 DIAGNOSIS — I059 Rheumatic mitral valve disease, unspecified: Secondary | ICD-10-CM

## 2014-05-26 LAB — POCT INR: INR: 3.4

## 2014-05-26 NOTE — Patient Instructions (Addendum)
Your physician wants you to follow-up in: 6 months with Dr McDowell You will receive a reminder letter in the mail two months in advance. If you don't receive a letter, please call our office to schedule the follow-up appointment.    Your physician recommends that you continue on your current medications as directed. Please refer to the Current Medication list given to you today.    Your physician has requested that you have an echocardiogram. Echocardiography is a painless test that uses sound waves to create images of your heart. It provides your doctor with information about the size and shape of your heart and how well your heart's chambers and valves are working. This procedure takes approximately one hour. There are no restrictions for this procedure.     Thank you for choosing [facility] !        

## 2014-05-26 NOTE — Progress Notes (Signed)
Cardiology Office Note  Date: 05/26/2014   ID: [name], DOB 1970/11/12, MRN [id number]  PCP: Purvis Kilts, MD  Primary Cardiologist: Rozann Lesches, MD   Chief Complaint  Patient presents with  . Mitral valve disease    History of Present Illness: [name] is a 44 y.o. female last seen in October 2015. She presents for a routine visit. From a cardiac perspective, she has had no significant change in symptomatology, specifically no palpitations, exertional chest pain, or worsening shortness of breath.   She continues on Coumadin with history of St. Jude mitral prosthesis, reports no spontaneous bleeding problems, due for INR check today. She is followed in our anticoagulation clinic.  He also continues on Rituxan per her rheumatologist for treatment of SLE. Her last echocardiogram is noted below.   Past Medical History  Diagnosis Date  . SLE (systemic lupus erythematosus)   . BRCA2 negative   . BRCA1 negative   . 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 B12 deficiency   . Mitral valve disease, rheumatic     St. Jude prosthesis  . Raynaud disease   . Fibromyalgia   . Anxiety   . Peripheral neuropathy   . Anticoagulation goal of INR 2.5 to 3.5   . Breast cancer 2011    Left side  . Drug-induced hepatitis     States per her rheumatologist, transaminases elevated but normalized after drug removed for   . Bursitis of right knee     Septic bursitis    Current Outpatient Prescriptions  Medication Sig Dispense Refill  . ALPRAZolam (XANAX) 0.5 MG tablet Take 0.5 mg by mouth at bedtime.     . cyanocobalamin (,VITAMIN B-12,) 1000 MCG/ML injection Inject 1 mL (1,000 mcg total) into the muscle every 30 (thirty) days. 10 mL prn  . dexlansoprazole (DEXILANT) 60 MG capsule Take 1 capsule (60 mg total) by mouth daily. 31 capsule 11  . folic acid (FOLVITE) 1 MG tablet Take 1 mg by mouth.    . furosemide (LASIX) 40 MG tablet Take 1 tablet  (40 mg total) by mouth daily. 90 tablet 3  . gabapentin (NEURONTIN) 600 MG tablet Take 600 mg by mouth 2 (two) times daily.     . hydroxychloroquine (PLAQUENIL) 200 MG tablet Take 200 mg by mouth 2 (two) times daily.      Marland Kitchen ibuprofen (ADVIL,MOTRIN) 200 MG tablet Take 800 mg by mouth every 6 (six) hours as needed for headache.    . levothyroxine (SYNTHROID, LEVOTHROID) 100 MCG tablet Take 100 mcg by mouth daily before breakfast.    . metoCLOPramide (REGLAN) 5 MG/5ML solution Take 10 mLs (10 mg total) by mouth 4 (four) times daily -  before meals and at bedtime. 240 mL 3  . oxyCODONE-acetaminophen (PERCOCET) 7.5-325 MG per tablet Take 1 tablet by mouth every 4 (four) hours as needed for pain.    . potassium chloride (KLOR-CON 10) 10 MEQ tablet Take one tablet daily as directed    . promethazine (PHENERGAN) 25 MG tablet Take 25 mg by mouth every 6 (six) hours as needed for nausea or vomiting.    . riTUXimab (RITUXAN) 100 MG/10ML injection Inject into the vein. Every 6 months    . rOPINIRole (REQUIP) 0.5 MG tablet Take 1 mg by mouth at bedtime.     Marland Kitchen warfarin (COUMADIN) 5 MG tablet Take 2 tablets daily or as directed 70 tablet 3  . zolpidem (AMBIEN) 10 MG tablet  Take 10 mg by mouth at bedtime as needed for sleep.      No current facility-administered medications for this visit.    Allergies:  Review of patient's allergies indicates no known allergies.   Social History: The patient  reports that she quit smoking about 8 years ago. Her smoking use included Cigarettes. She has a 15 pack-year smoking history. She has never used smokeless tobacco. She reports that she drinks alcohol. She reports that she does not use illicit drugs.   ROS:  Please see the history of present illness. Otherwise, complete review of systems is positive for joint pains.  All other systems are reviewed and negative.   Physical Exam: VS:  BP 124/84 mmHg  Pulse 64  Ht _0  (1.626 m)  Wt 170 lb (77.111 kg)  BMI 29.17 kg/m2   SpO2 99%, BMI Body mass index is 29.17 kg/(m^2).  Wt Readings from Last 3 Encounters:  05/26/14 170 lb (77.111 kg)  03/22/14 170 lb 9.6 oz (77.384 kg)  02/08/14 165 lb (74.844 kg)     Appears comfortable at rest.  HEENT: Conjunctiva and lids normal, oropharynx clear.  Neck: Supple, no elevated JVP or carotid bruits, no thyromegaly.  Lungs: Clear to auscultation, nonlabored breathing at rest.  Cardiac: Regular rate and rhythm, crisp prosthetic sound in S1, no S3 or significant systolic murmur, no pericardial rub.  Abdomen: Soft, nontender, bowel sounds present, no guarding or rebound.  Extremities: No pitting edema, distal pulses 2+.  Skin: Warm and dry.  Musculoskeletal: No kyphosis.  Neuropsychiatric: Alert and oriented x3, affect grossly appropriate.   ECG: ECG is not ordered today.  Recent Labwork: 06/22/2013: ALT 15; AST 17; BUN 11; Creatinine 0.77; Hemoglobin 11.3*; Platelets 163; Potassium 3.4*; Sodium 140   Other Studies Reviewed Today:  Echocardiogram 11/17/2013: Study Conclusions  - Left ventricle: The cavity size was normal. Wall thickness was increased in a pattern of mild LVH. Systolic function was normal. The estimated ejection fraction was in the range of 50% to 55%. Wall motion was normal; there were no regional wall motion abnormalities. - Mitral valve: A [employer]. [employer] mechanical prosthesis was present. There was mild regurgitation. Mean gradient (D): 3 mm Hg. Valve area by pressure half-time: 2.08 cm^2. - Left atrium: The atrium was severely dilated. - Right atrium: Central venous pressure (est): 3 mm Hg. - Atrial septum: No defect or patent foramen ovale was identified. - Tricuspid valve: There was trivial regurgitation. - Pulmonary arteries: PA peak pressure: 18 mm Hg (S). - Pericardium, extracardiac: There was no pericardial effusion.  Impressions:  - Mild LVH with LVEF 50-55%, indeterminate diastolic function. Severe left  atrial enlargement. St. Jude prosthesis in mitral position with probable mild mitral regurgitation, and mean gradient 3 mm mercury which is stable. Normal PASP 18 mm mercury.   ASSESSMENT AND PLAN:  1. History of rheumatic heart disease status post St. Jude mitral prosthesis. She continues on Coumadin with follow-up in our anticoagulation clinic. Normal bowel function noted by echocardiogram from last year.  2. Patient with SLE, currently on Rituxan per her rheumatologist. Follow-up screening echocardiogram will be obtained to exclude cardiotoxicity.  Current medicines were reviewed at length with the patient today.   Orders Placed This Encounter  Procedures  . 2D Echocardiogram with contrast    Disposition: FU with me in 6 months.   Signed, Satira Sark, MD, [facility] 05/26/2014 11:51 AM    [facility] [address],  [place], [place] [address] Phone: [phone]; Fax: [phone]

## 2014-05-29 ENCOUNTER — Other Ambulatory Visit: Payer: Self-pay | Admitting: Gastroenterology

## 2014-06-01 ENCOUNTER — Ambulatory Visit (HOSPITAL_COMMUNITY)
Admission: RE | Admit: 2014-06-01 | Discharge: 2014-06-01 | Disposition: A | Discharge status: Home / Self Care | Payer: BLUE CROSS/BLUE SHIELD | Source: Ambulatory Visit | Attending: Cardiology | Admitting: Cardiology

## 2014-06-01 DIAGNOSIS — Z7901 Long term (current) use of anticoagulants: Secondary | ICD-10-CM | POA: Insufficient documentation

## 2014-06-01 DIAGNOSIS — Z79899 Other long term (current) drug therapy: Secondary | ICD-10-CM | POA: Diagnosis not present

## 2014-06-01 DIAGNOSIS — I429 Cardiomyopathy, unspecified: Secondary | ICD-10-CM | POA: Diagnosis not present

## 2014-06-01 DIAGNOSIS — I081 Rheumatic disorders of both mitral and tricuspid valves: Secondary | ICD-10-CM | POA: Insufficient documentation

## 2014-06-01 DIAGNOSIS — Z954 Presence of other heart-valve replacement: Secondary | ICD-10-CM | POA: Diagnosis not present

## 2014-06-01 DIAGNOSIS — I517 Cardiomegaly: Secondary | ICD-10-CM | POA: Diagnosis not present

## 2014-06-01 NOTE — Progress Notes (Signed)
  Echocardiogram 2D Echocardiogram has been performed.  Samuel Germany 06/01/2014, 3:42 PM

## 2014-06-07 DIAGNOSIS — E039 Hypothyroidism, unspecified: Secondary | ICD-10-CM | POA: Diagnosis not present

## 2014-06-16 ENCOUNTER — Ambulatory Visit (INDEPENDENT_AMBULATORY_CARE_PROVIDER_SITE_OTHER): Payer: BLUE CROSS/BLUE SHIELD | Admitting: *Deleted

## 2014-06-16 DIAGNOSIS — Z7901 Long term (current) use of anticoagulants: Secondary | ICD-10-CM

## 2014-06-16 DIAGNOSIS — Z954 Presence of other heart-valve replacement: Secondary | ICD-10-CM

## 2014-06-16 DIAGNOSIS — I059 Rheumatic mitral valve disease, unspecified: Secondary | ICD-10-CM | POA: Diagnosis not present

## 2014-06-16 DIAGNOSIS — Z5181 Encounter for therapeutic drug level monitoring: Secondary | ICD-10-CM | POA: Diagnosis not present

## 2014-06-16 DIAGNOSIS — Z952 Presence of prosthetic heart valve: Secondary | ICD-10-CM

## 2014-06-16 LAB — POCT INR: INR: 3.8

## 2014-06-21 ENCOUNTER — Other Ambulatory Visit: Payer: Self-pay | Admitting: Cardiology

## 2014-06-21 DIAGNOSIS — Z6828 Body mass index (BMI) 28.0-28.9, adult: Secondary | ICD-10-CM | POA: Diagnosis not present

## 2014-06-21 DIAGNOSIS — L259 Unspecified contact dermatitis, unspecified cause: Secondary | ICD-10-CM | POA: Diagnosis not present

## 2014-06-21 DIAGNOSIS — D510 Vitamin B12 deficiency anemia due to intrinsic factor deficiency: Secondary | ICD-10-CM | POA: Diagnosis not present

## 2014-06-21 DIAGNOSIS — E663 Overweight: Secondary | ICD-10-CM | POA: Diagnosis not present

## 2014-06-21 DIAGNOSIS — E538 Deficiency of other specified B group vitamins: Secondary | ICD-10-CM | POA: Diagnosis not present

## 2014-06-21 DIAGNOSIS — G894 Chronic pain syndrome: Secondary | ICD-10-CM | POA: Diagnosis not present

## 2014-06-21 DIAGNOSIS — E782 Mixed hyperlipidemia: Secondary | ICD-10-CM | POA: Diagnosis not present

## 2014-06-22 ENCOUNTER — Ambulatory Visit (INDEPENDENT_AMBULATORY_CARE_PROVIDER_SITE_OTHER): Payer: BLUE CROSS/BLUE SHIELD | Admitting: Gastroenterology

## 2014-06-22 ENCOUNTER — Encounter: Payer: Self-pay | Admitting: Gastroenterology

## 2014-06-22 VITALS — BP 115/82 | HR 76 | Temp 97.6°F | Ht 64.0 in | Wt 165.8 lb

## 2014-06-22 DIAGNOSIS — K3184 Gastroparesis: Secondary | ICD-10-CM | POA: Diagnosis not present

## 2014-06-22 MED ORDER — PROMETHAZINE HCL 25 MG PO TABS: 25.0000 mg | ORAL_TABLET | Freq: Four times a day (QID) | ORAL | Status: DC | PRN

## 2014-06-22 MED ORDER — DICYCLOMINE HCL 10 MG PO CAPS: 10.0000 mg | ORAL_CAPSULE | Freq: Three times a day (TID) | ORAL | Status: DC

## 2014-06-22 MED ORDER — METOCLOPRAMIDE HCL 5 MG/5ML PO SOLN: 10.0000 mg | Freq: Three times a day (TID) | ORAL | Status: DC

## 2014-06-22 MED ORDER — ONDANSETRON HCL 4 MG PO TABS: 4.0000 mg | ORAL_TABLET | Freq: Three times a day (TID) | ORAL | Status: DC

## 2014-06-22 NOTE — Progress Notes (Signed)
Referring Provider: Sharilyn Sites, MD Primary Care Physician:  Purvis Kilts, MD  Primary GI: Dr. Gala Romney   Chief Complaint  Patient presents with  . Follow-up    HPI:   [name] is a 44 y.o. female presenting today with a history of erosive esophagitis and gastroparesis. Last seen in Feb 2016. Routine follow-up. Getting ready to go on a cruise.   Recently tried Marinol and took BID without any other agents. Has done Reglan BID in the past. Feels like Marinol did not help. Reglan has done better historically. Thinks she may not have done well with Zofran in the past, but this was during chemo. Willing to try again.   Having diarrhea but doesn't feel like it's a stomach bug. Will have a few times. Diarrhea about a week ago. Daughter sick a few weeks ago. No recent antibiotics. No rectal bleeding. Some mild associated cramping. Bloating. No changes in medications. Usually a BM several times a day is normal. Declining stool studies.   Past Medical History  Diagnosis Date  . SLE (systemic lupus erythematosus)   . BRCA2 negative   . BRCA1 negative   . 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 B12 deficiency   . Mitral valve disease, rheumatic     St. Jude prosthesis  . Raynaud disease   . Fibromyalgia   . Anxiety   . Peripheral neuropathy   . Anticoagulation goal of INR 2.5 to 3.5   . Breast cancer 2011    Left side  . Drug-induced hepatitis     States per her rheumatologist, transaminases elevated but normalized after drug removed for   . Bursitis of right knee     Septic bursitis    Past Surgical History  Procedure Laterality Date  . Mitral valve replacement    . Cholecystectomy    . Mastectomy    . Breast surgery    . Endometrial ablation  2008    She no longer has menses  . Insertion expander left breast  11/16/10  . Tissue expander placement  02/14/2011    Procedure: TISSUE EXPANDER;  Surgeon: Macon Large;  Location: [facility];   Service: Plastics;  Laterality: Bilateral;  Left Breast Remove Tissue Expander Placement of Implant Breast Reconstruction  . Mastopexy  02/14/2011    Procedure: MASTOPEXY;  Surgeon: Macon Large;  Location: [facility];  Service: Plastics;  Laterality: Bilateral;  Right Breast Reduction   . Esophagogastroduodenoscopy N/A 03/25/2013    Dr. Raliegh Scarlet reflux esophagitis-likely source of patient's symptoms (patulous EG junction). Hiatal hernia otherwise normal    Current Outpatient Prescriptions  Medication Sig Dispense Refill  . ALPRAZolam (XANAX) 0.5 MG tablet Take 0.5 mg by mouth at bedtime.     . cyanocobalamin (,VITAMIN B-12,) 1000 MCG/ML injection Inject 1 mL (1,000 mcg total) into the muscle every 30 (thirty) days. 10 mL prn  . DEXILANT 60 MG capsule TAKE ONE CAPSULE BY MOUTH DAILY. 31 capsule 11  . folic acid (FOLVITE) 1 MG tablet Take 1 mg by mouth.    . furosemide (LASIX) 40 MG tablet Take 1 tablet (40 mg total) by mouth daily. 90 tablet 3  . gabapentin (NEURONTIN) 600 MG tablet Take 600 mg by mouth 2 (two) times daily.     . hydroxychloroquine (PLAQUENIL) 200 MG tablet Take 200 mg by mouth 2 (two) times daily.      Marland Kitchen ibuprofen (ADVIL,MOTRIN) 200 MG tablet Take 800 mg by mouth every  6 (six) hours as needed for headache.    . levothyroxine (SYNTHROID, LEVOTHROID) 100 MCG tablet Take 100 mcg by mouth daily before breakfast.    . oxyCODONE-acetaminophen (PERCOCET) 7.5-325 MG per tablet Take 1 tablet by mouth every 4 (four) hours as needed for pain.    . potassium chloride (KLOR-CON 10) 10 MEQ tablet Take one tablet daily as directed    . riTUXimab (RITUXAN) 100 MG/10ML injection Inject into the vein. Every 6 months    . rOPINIRole (REQUIP) 0.5 MG tablet Take 1 mg by mouth at bedtime.     Marland Kitchen warfarin (COUMADIN) 5 MG tablet TAKE 2 TABLETS BY MOUTH DAILY OR AS DIRECTED. 70 tablet 3  . zolpidem (AMBIEN) 10 MG tablet Take 10 mg by mouth at bedtime as needed for sleep.     . metoCLOPramide (REGLAN)  5 MG/5ML solution Take 10 mLs (10 mg total) by mouth 4 (four) times daily -  before meals and at bedtime. (Patient not taking: Reported on 06/22/2014) 240 mL 3  . promethazine (PHENERGAN) 25 MG tablet Take 25 mg by mouth every 6 (six) hours as needed for nausea or vomiting.     No current facility-administered medications for this visit.    Allergies as of 06/22/2014  . (No Known Allergies)    Family History  Problem Relation Age of Onset  . Colon cancer Neg Hx     History   Social History  . Marital Status: Married    Spouse Name: N/A  . Number of Children: 2  . Years of Education: N/A   Social History Main Topics  . Smoking status: Former Smoker -- 1.00 packs/day for 15 years    Types: Cigarettes    Quit date: 02/19/2006  . Smokeless tobacco: Never Used  . Alcohol Use: Yes     Comment: Occasional  . Drug Use: No  . Sexual Activity: No   Other Topics Concern  . None   Social History Narrative    Review of Systems: Negative unless mentioned in HPI.   Physical Exam: BP 115/82 mmHg  Pulse 76  Temp(Src) 97.6 F (36.4 C) (Oral)  Ht 5' 4"  (1.626 m)  Wt 165 lb 12.8 oz (75.206 kg)  BMI 28.45 kg/m2 General:   Alert and oriented. No distress noted. Pleasant and cooperative.  Head:  Normocephalic and atraumatic. Eyes:  Conjuctiva clear without scleral icterus. Abdomen:  +BS, soft, non-tender and non-distended. No rebound or guarding. No HSM or masses noted. Msk:  Symmetrical without gross deformities. Normal posture. Extremities:  Without edema. Neurologic:  Alert and  oriented x4;  grossly normal neurologically. Skin:  Intact without significant lesions or rashes. Psych:  Alert and cooperative. Normal mood and affect.

## 2014-06-22 NOTE — Patient Instructions (Signed)
Stop Marinol for now. Restart Reglan twice a day, 30 minutes before breakfast and dinner. Take Zofran scheduled with meals, 30 minutes before meals. Continue Dexilant once each morning.   I have sent in Bentyl for loose stools to take with meals and at bedtime.   Phenergan has been refilled.   Please let me know how you are doing when you return from the cruise!! Have a wonderful time!

## 2014-06-24 DIAGNOSIS — M2550 Pain in unspecified joint: Secondary | ICD-10-CM | POA: Diagnosis not present

## 2014-06-24 DIAGNOSIS — M329 Systemic lupus erythematosus, unspecified: Secondary | ICD-10-CM | POA: Diagnosis not present

## 2014-06-24 DIAGNOSIS — M0609 Rheumatoid arthritis without rheumatoid factor, multiple sites: Secondary | ICD-10-CM | POA: Diagnosis not present

## 2014-06-24 DIAGNOSIS — M545 Low back pain: Secondary | ICD-10-CM | POA: Diagnosis not present

## 2014-06-29 NOTE — Assessment & Plan Note (Signed)
44 year old female with known erosive esophagitis and gastroparesis, without any significant improvement on Marinol. Will resume Reglan low dose BID, add Zofran scheduled with meals, and continue PPI. As of note, reports loose stools but declining stool studies. Bentyl for supportive measures. If any persistent symptoms, recommend stool studies. Return in 3 months.

## 2014-06-30 NOTE — Progress Notes (Signed)
CC'ED TO PCP 

## 2014-07-07 ENCOUNTER — Ambulatory Visit (INDEPENDENT_AMBULATORY_CARE_PROVIDER_SITE_OTHER): Payer: BLUE CROSS/BLUE SHIELD | Admitting: *Deleted

## 2014-07-07 DIAGNOSIS — I059 Rheumatic mitral valve disease, unspecified: Secondary | ICD-10-CM

## 2014-07-07 DIAGNOSIS — Z5181 Encounter for therapeutic drug level monitoring: Secondary | ICD-10-CM | POA: Diagnosis not present

## 2014-07-07 DIAGNOSIS — Z954 Presence of other heart-valve replacement: Secondary | ICD-10-CM | POA: Diagnosis not present

## 2014-07-07 DIAGNOSIS — Z952 Presence of prosthetic heart valve: Secondary | ICD-10-CM

## 2014-07-07 DIAGNOSIS — Z7901 Long term (current) use of anticoagulants: Secondary | ICD-10-CM | POA: Diagnosis not present

## 2014-07-07 LAB — POCT INR: INR: 4.2

## 2014-07-20 ENCOUNTER — Other Ambulatory Visit (HOSPITAL_COMMUNITY)
Admission: RE | Admit: 2014-07-20 | Discharge: 2014-07-20 | Disposition: A | Discharge status: Home / Self Care | Payer: BLUE CROSS/BLUE SHIELD | Source: Ambulatory Visit | Attending: Adult Health | Admitting: Adult Health

## 2014-07-20 ENCOUNTER — Encounter: Payer: Self-pay | Admitting: Adult Health

## 2014-07-20 ENCOUNTER — Ambulatory Visit (INDEPENDENT_AMBULATORY_CARE_PROVIDER_SITE_OTHER): Payer: BLUE CROSS/BLUE SHIELD | Admitting: Adult Health

## 2014-07-20 VITALS — BP 130/82 | HR 60 | Ht 63.5 in | Wt 168.0 lb

## 2014-07-20 DIAGNOSIS — R87810 Cervical high risk human papillomavirus (HPV) DNA test positive: Secondary | ICD-10-CM | POA: Insufficient documentation

## 2014-07-20 DIAGNOSIS — Z1151 Encounter for screening for human papillomavirus (HPV): Secondary | ICD-10-CM | POA: Diagnosis present

## 2014-07-20 DIAGNOSIS — Z01419 Encounter for gynecological examination (general) (routine) without abnormal findings: Secondary | ICD-10-CM

## 2014-07-20 DIAGNOSIS — IMO0002 Reserved for concepts with insufficient information to code with codable children: Secondary | ICD-10-CM

## 2014-07-20 DIAGNOSIS — Z01411 Encounter for gynecological examination (general) (routine) with abnormal findings: Secondary | ICD-10-CM | POA: Diagnosis not present

## 2014-07-20 LAB — HEMOCCULT GUIAC POC 1CARD (OFFICE): Fecal Occult Blood, POC: NEGATIVE

## 2014-07-20 NOTE — Progress Notes (Signed)
Patient ID: [name], female   DOB: 1970/09/26, 44 y.o.   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 PCP is Dr Hilma Favors.  Current Medications, Allergies, Past Medical History, Past Surgical History, Family History and Social History were reviewed in [employer] record.     Review of Systems: Patient denies any headaches, hearing loss, fatigue, blurred vision, shortness of breath, chest pain, abdominal pain, problems with bowel movements, urination, or intercourse(not having sex). No joint pain or mood swings.She is on warfarin, has mechanical heart valve.And is sp breast cancer.She had an ablation.    Physical Exam:BP 130/82 mmHg  Pulse 60  Ht 5' 3.5" (1.613 m)  Wt 168 lb (76.204 kg)  BMI 29.29 kg/m2 General:  Well developed, well nourished, no acute distress Skin:  Warm and dry Neck:  Midline trachea, normal thyroid, good ROM, no lymphadenopathy Lungs; Clear to auscultation bilaterally Breast:  Deferred, at pt request, had normal mammogram Cardiovascular: Regular rate and rhythm, has mechanical valve Abdomen:  Soft, non tender, no hepatosplenomegaly, has discoloration from when on lovanox Pelvic:  External genitalia is normal in appearance, no lesions.  The vagina is normal in appearance. Urethra has no lesions or masses. The cervix is bulbous. Pap with HPV performed. Uterus is felt to be normal size, shape, and contour.  No adnexal masses or tenderness noted.Bladder is non tender, no masses felt. Rectal: Good sphincter tone, no polyps, or hemorrhoids felt.  Hemoccult negative. Extremities/musculoskeletal:  No swelling or varicosities noted, no clubbing or cyanosis Psych:  No mood changes, alert and cooperative,seems happy   Impression: Well woman gyn exam and pap History of +HPV    Plan: Physical in 1  year Mammogram yearly  Colonoscopy at 44 Labs with PCP

## 2014-07-20 NOTE — Patient Instructions (Signed)
Physical in 1 year Mammogram yearly Colonoscopy at 35  Labs with PCP

## 2014-07-23 DIAGNOSIS — Z6828 Body mass index (BMI) 28.0-28.9, adult: Secondary | ICD-10-CM | POA: Diagnosis not present

## 2014-07-23 DIAGNOSIS — E538 Deficiency of other specified B group vitamins: Secondary | ICD-10-CM | POA: Diagnosis not present

## 2014-07-23 DIAGNOSIS — E663 Overweight: Secondary | ICD-10-CM | POA: Diagnosis not present

## 2014-07-23 DIAGNOSIS — M3219 Other organ or system involvement in systemic lupus erythematosus: Secondary | ICD-10-CM | POA: Diagnosis not present

## 2014-07-23 LAB — CYTOLOGY - PAP

## 2014-07-26 ENCOUNTER — Telehealth: Payer: Self-pay | Admitting: Adult Health

## 2014-07-26 NOTE — Telephone Encounter (Signed)
Pt aware of pap and need for colpo

## 2014-07-28 ENCOUNTER — Ambulatory Visit (INDEPENDENT_AMBULATORY_CARE_PROVIDER_SITE_OTHER): Payer: BLUE CROSS/BLUE SHIELD | Admitting: *Deleted

## 2014-07-28 DIAGNOSIS — I059 Rheumatic mitral valve disease, unspecified: Secondary | ICD-10-CM | POA: Diagnosis not present

## 2014-07-28 DIAGNOSIS — Z5181 Encounter for therapeutic drug level monitoring: Secondary | ICD-10-CM | POA: Diagnosis not present

## 2014-07-28 DIAGNOSIS — Z954 Presence of other heart-valve replacement: Secondary | ICD-10-CM | POA: Diagnosis not present

## 2014-07-28 DIAGNOSIS — Z952 Presence of prosthetic heart valve: Secondary | ICD-10-CM

## 2014-07-28 DIAGNOSIS — Z7901 Long term (current) use of anticoagulants: Secondary | ICD-10-CM

## 2014-07-28 LAB — POCT INR: INR: 3.3

## 2014-08-02 ENCOUNTER — Encounter: Payer: Self-pay | Admitting: Obstetrics & Gynecology

## 2014-08-02 ENCOUNTER — Ambulatory Visit (INDEPENDENT_AMBULATORY_CARE_PROVIDER_SITE_OTHER): Payer: BLUE CROSS/BLUE SHIELD | Admitting: Obstetrics & Gynecology

## 2014-08-02 VITALS — BP 110/80 | HR 72 | Ht 64.0 in | Wt 172.0 lb

## 2014-08-02 DIAGNOSIS — IMO0002 Reserved for concepts with insufficient information to code with codable children: Secondary | ICD-10-CM

## 2014-08-02 DIAGNOSIS — R87610 Atypical squamous cells of undetermined significance on cytologic smear of cervix (ASC-US): Secondary | ICD-10-CM

## 2014-08-02 DIAGNOSIS — Z853 Personal history of malignant neoplasm of breast: Secondary | ICD-10-CM

## 2014-08-02 DIAGNOSIS — R87810 Cervical high risk human papillomavirus (HPV) DNA test positive: Secondary | ICD-10-CM

## 2014-08-24 DIAGNOSIS — G894 Chronic pain syndrome: Secondary | ICD-10-CM | POA: Diagnosis not present

## 2014-08-24 DIAGNOSIS — E663 Overweight: Secondary | ICD-10-CM | POA: Diagnosis not present

## 2014-08-24 DIAGNOSIS — Z6828 Body mass index (BMI) 28.0-28.9, adult: Secondary | ICD-10-CM | POA: Diagnosis not present

## 2014-08-24 DIAGNOSIS — D519 Vitamin B12 deficiency anemia, unspecified: Secondary | ICD-10-CM | POA: Diagnosis not present

## 2014-08-25 ENCOUNTER — Ambulatory Visit (INDEPENDENT_AMBULATORY_CARE_PROVIDER_SITE_OTHER): Payer: BLUE CROSS/BLUE SHIELD | Admitting: *Deleted

## 2014-08-25 DIAGNOSIS — Z5181 Encounter for therapeutic drug level monitoring: Secondary | ICD-10-CM | POA: Diagnosis not present

## 2014-08-25 DIAGNOSIS — Z952 Presence of prosthetic heart valve: Secondary | ICD-10-CM

## 2014-08-25 DIAGNOSIS — I059 Rheumatic mitral valve disease, unspecified: Secondary | ICD-10-CM

## 2014-08-25 DIAGNOSIS — Z954 Presence of other heart-valve replacement: Secondary | ICD-10-CM | POA: Diagnosis not present

## 2014-08-25 DIAGNOSIS — Z7901 Long term (current) use of anticoagulants: Secondary | ICD-10-CM

## 2014-08-25 LAB — POCT INR: INR: 2.3

## 2014-09-03 ENCOUNTER — Encounter (HOSPITAL_COMMUNITY): Admission: RE | Admit: 2014-09-03 | Payer: Medicare Other | Source: Ambulatory Visit

## 2014-09-03 ENCOUNTER — Other Ambulatory Visit (HOSPITAL_COMMUNITY): Payer: Self-pay | Admitting: Rheumatology

## 2014-09-08 ENCOUNTER — Ambulatory Visit (INDEPENDENT_AMBULATORY_CARE_PROVIDER_SITE_OTHER): Payer: BLUE CROSS/BLUE SHIELD | Admitting: *Deleted

## 2014-09-08 DIAGNOSIS — Z7901 Long term (current) use of anticoagulants: Secondary | ICD-10-CM | POA: Diagnosis not present

## 2014-09-08 DIAGNOSIS — I059 Rheumatic mitral valve disease, unspecified: Secondary | ICD-10-CM

## 2014-09-08 DIAGNOSIS — Z5181 Encounter for therapeutic drug level monitoring: Secondary | ICD-10-CM

## 2014-09-08 DIAGNOSIS — Z954 Presence of other heart-valve replacement: Secondary | ICD-10-CM | POA: Diagnosis not present

## 2014-09-08 DIAGNOSIS — Z952 Presence of prosthetic heart valve: Secondary | ICD-10-CM

## 2014-09-08 LAB — POCT INR: INR: 1.9

## 2014-09-15 NOTE — Progress Notes (Signed)
Patient ID: [name], female   DOB: 12-30-1970, 44 y.o.   MRN: [id number] Colposcopy Procedure Note:  Colposcopy Procedure Note  Indications: Pap smear 1 months ago showed: ASCUS with POSITIVE high risk HPV. The prior pap showed ASCUS with POSITIVE high risk HPV.  Prior cervical/vaginal disease: normal exam without visible pathology. Prior cervical treatment: no treatment.  Smoker:  No. New sexual partner:  No.  : time frame:    History of abnormal Pap: yes  Procedure Details  The risks and benefits of the procedure and Written informed consent obtained.  Speculum placed in vagina and excellent visualization of cervix achieved, cervix swabbed x 3 with acetic acid solution.  Findings: Cervix: no visible lesions, no mosaicism, no punctation and no abnormal vasculature; no biopsies taken. Vaginal inspection: normal without visible lesions. Vulvar colposcopy: vulvar colposcopy not performed.  Specimens: none  Complications: none.  Plan: Repeat Pap 1 year

## 2014-09-17 ENCOUNTER — Encounter (HOSPITAL_COMMUNITY): Admission: RE | Admit: 2014-09-17 | Payer: Medicare Other | Source: Ambulatory Visit

## 2014-09-20 ENCOUNTER — Ambulatory Visit (INDEPENDENT_AMBULATORY_CARE_PROVIDER_SITE_OTHER): Payer: BLUE CROSS/BLUE SHIELD | Admitting: *Deleted

## 2014-09-20 DIAGNOSIS — Z954 Presence of other heart-valve replacement: Secondary | ICD-10-CM

## 2014-09-20 DIAGNOSIS — Z5181 Encounter for therapeutic drug level monitoring: Secondary | ICD-10-CM | POA: Diagnosis not present

## 2014-09-20 DIAGNOSIS — I059 Rheumatic mitral valve disease, unspecified: Secondary | ICD-10-CM | POA: Diagnosis not present

## 2014-09-20 DIAGNOSIS — Z7901 Long term (current) use of anticoagulants: Secondary | ICD-10-CM | POA: Diagnosis not present

## 2014-09-20 DIAGNOSIS — Z952 Presence of prosthetic heart valve: Secondary | ICD-10-CM

## 2014-09-20 LAB — POCT INR: INR: 4.2

## 2014-09-20 MED ORDER — WARFARIN SODIUM 5 MG PO TABS: ORAL_TABLET | ORAL | Status: DC

## 2014-09-22 ENCOUNTER — Ambulatory Visit: Payer: BLUE CROSS/BLUE SHIELD | Admitting: Gastroenterology

## 2014-09-27 DIAGNOSIS — E538 Deficiency of other specified B group vitamins: Secondary | ICD-10-CM | POA: Diagnosis not present

## 2014-09-27 DIAGNOSIS — Z1389 Encounter for screening for other disorder: Secondary | ICD-10-CM | POA: Diagnosis not present

## 2014-09-27 DIAGNOSIS — Z6828 Body mass index (BMI) 28.0-28.9, adult: Secondary | ICD-10-CM | POA: Diagnosis not present

## 2014-09-27 DIAGNOSIS — E663 Overweight: Secondary | ICD-10-CM | POA: Diagnosis not present

## 2014-09-27 DIAGNOSIS — G894 Chronic pain syndrome: Secondary | ICD-10-CM | POA: Diagnosis not present

## 2014-10-11 ENCOUNTER — Ambulatory Visit (INDEPENDENT_AMBULATORY_CARE_PROVIDER_SITE_OTHER): Payer: BLUE CROSS/BLUE SHIELD | Admitting: *Deleted

## 2014-10-11 DIAGNOSIS — Z5181 Encounter for therapeutic drug level monitoring: Secondary | ICD-10-CM | POA: Diagnosis not present

## 2014-10-11 DIAGNOSIS — I059 Rheumatic mitral valve disease, unspecified: Secondary | ICD-10-CM

## 2014-10-11 DIAGNOSIS — Z952 Presence of prosthetic heart valve: Secondary | ICD-10-CM

## 2014-10-11 DIAGNOSIS — Z7901 Long term (current) use of anticoagulants: Secondary | ICD-10-CM

## 2014-10-11 DIAGNOSIS — Z954 Presence of other heart-valve replacement: Secondary | ICD-10-CM

## 2014-10-11 LAB — POCT INR: INR: 3.4

## 2014-10-15 ENCOUNTER — Ambulatory Visit: Payer: BLUE CROSS/BLUE SHIELD | Admitting: Gastroenterology

## 2014-10-22 DIAGNOSIS — G894 Chronic pain syndrome: Secondary | ICD-10-CM | POA: Diagnosis not present

## 2014-10-22 DIAGNOSIS — Z1389 Encounter for screening for other disorder: Secondary | ICD-10-CM | POA: Diagnosis not present

## 2014-10-22 DIAGNOSIS — Z6829 Body mass index (BMI) 29.0-29.9, adult: Secondary | ICD-10-CM | POA: Diagnosis not present

## 2014-10-22 DIAGNOSIS — E663 Overweight: Secondary | ICD-10-CM | POA: Diagnosis not present

## 2014-10-22 DIAGNOSIS — D511 Vitamin B12 deficiency anemia due to selective vitamin B12 malabsorption with proteinuria: Secondary | ICD-10-CM | POA: Diagnosis not present

## 2014-11-01 ENCOUNTER — Ambulatory Visit (INDEPENDENT_AMBULATORY_CARE_PROVIDER_SITE_OTHER): Payer: BLUE CROSS/BLUE SHIELD | Admitting: *Deleted

## 2014-11-01 DIAGNOSIS — Z954 Presence of other heart-valve replacement: Secondary | ICD-10-CM | POA: Diagnosis not present

## 2014-11-01 DIAGNOSIS — I059 Rheumatic mitral valve disease, unspecified: Secondary | ICD-10-CM

## 2014-11-01 DIAGNOSIS — Z7901 Long term (current) use of anticoagulants: Secondary | ICD-10-CM | POA: Diagnosis not present

## 2014-11-01 DIAGNOSIS — Z5181 Encounter for therapeutic drug level monitoring: Secondary | ICD-10-CM

## 2014-11-01 DIAGNOSIS — Z952 Presence of prosthetic heart valve: Secondary | ICD-10-CM

## 2014-11-01 LAB — POCT INR: INR: 3.1

## 2014-11-02 ENCOUNTER — Telehealth: Payer: Self-pay | Admitting: Gastroenterology

## 2014-11-02 ENCOUNTER — Encounter: Payer: Self-pay | Admitting: Gastroenterology

## 2014-11-02 ENCOUNTER — Ambulatory Visit: Payer: BLUE CROSS/BLUE SHIELD | Admitting: Gastroenterology

## 2014-11-02 NOTE — Telephone Encounter (Signed)
Letter mailed

## 2014-11-02 NOTE — Telephone Encounter (Signed)
Patient was a no-show 

## 2014-11-09 ENCOUNTER — Other Ambulatory Visit (HOSPITAL_COMMUNITY): Payer: Self-pay | Admitting: Rheumatology

## 2014-11-09 ENCOUNTER — Encounter (HOSPITAL_COMMUNITY): Payer: Self-pay

## 2014-11-09 ENCOUNTER — Encounter (HOSPITAL_COMMUNITY)
Admission: RE | Admit: 2014-11-09 | Discharge: 2014-11-09 | Disposition: A | Discharge status: Home / Self Care | Payer: BLUE CROSS/BLUE SHIELD | Source: Ambulatory Visit | Attending: Rheumatology | Admitting: Rheumatology

## 2014-11-09 DIAGNOSIS — M069 Rheumatoid arthritis, unspecified: Secondary | ICD-10-CM | POA: Diagnosis not present

## 2014-11-09 MED ORDER — ACETAMINOPHEN 325 MG PO TABS
650.0000 mg | ORAL_TABLET | ORAL | Status: DC
  Administered 2014-11-09: 650 mg via ORAL
  Filled 2014-11-09: qty 2

## 2014-11-09 MED ORDER — DIPHENHYDRAMINE HCL 25 MG PO CAPS
50.0000 mg | ORAL_CAPSULE | ORAL | Status: DC
  Administered 2014-11-09: 50 mg via ORAL
  Filled 2014-11-09: qty 2

## 2014-11-09 MED ORDER — RITUXIMAB CHEMO INJECTION 500 MG/50ML
1000.0000 mg | INTRAVENOUS | Status: DC
  Administered 2014-11-09: 1000 mg via INTRAVENOUS
  Filled 2014-11-09: qty 100

## 2014-11-09 MED ORDER — SODIUM CHLORIDE 0.9 % IV SOLN
INTRAVENOUS | Status: DC
  Administered 2014-11-09: 07:00:00 via INTRAVENOUS

## 2014-11-09 MED ORDER — METHYLPREDNISOLONE SODIUM SUCC 125 MG IJ SOLR
125.0000 mg | INTRAMUSCULAR | Status: DC
  Administered 2014-11-09: 125 mg via INTRAVENOUS
  Filled 2014-11-09: qty 2

## 2014-11-09 NOTE — Progress Notes (Signed)
Rituxan infusion completed, no adverse reactions.  Pt tolerated well.  Pt is scheduled for 2nd infusion Oct.7, pt is aware of this appointment.  Vss, afebrile.  Pt d/c home with friend ambulatory to lobby.

## 2014-11-09 NOTE — Discharge Instructions (Signed)

## 2014-11-15 ENCOUNTER — Ambulatory Visit (INDEPENDENT_AMBULATORY_CARE_PROVIDER_SITE_OTHER): Payer: BLUE CROSS/BLUE SHIELD | Admitting: *Deleted

## 2014-11-15 DIAGNOSIS — Z1389 Encounter for screening for other disorder: Secondary | ICD-10-CM | POA: Diagnosis not present

## 2014-11-15 DIAGNOSIS — D519 Vitamin B12 deficiency anemia, unspecified: Secondary | ICD-10-CM | POA: Diagnosis not present

## 2014-11-15 DIAGNOSIS — Z6830 Body mass index (BMI) 30.0-30.9, adult: Secondary | ICD-10-CM | POA: Diagnosis not present

## 2014-11-15 DIAGNOSIS — G894 Chronic pain syndrome: Secondary | ICD-10-CM | POA: Diagnosis not present

## 2014-11-15 DIAGNOSIS — Z954 Presence of other heart-valve replacement: Secondary | ICD-10-CM

## 2014-11-15 DIAGNOSIS — Z5181 Encounter for therapeutic drug level monitoring: Secondary | ICD-10-CM

## 2014-11-15 DIAGNOSIS — I059 Rheumatic mitral valve disease, unspecified: Secondary | ICD-10-CM | POA: Diagnosis not present

## 2014-11-15 DIAGNOSIS — E6609 Other obesity due to excess calories: Secondary | ICD-10-CM | POA: Diagnosis not present

## 2014-11-15 DIAGNOSIS — Z7901 Long term (current) use of anticoagulants: Secondary | ICD-10-CM

## 2014-11-15 DIAGNOSIS — Z952 Presence of prosthetic heart valve: Secondary | ICD-10-CM

## 2014-11-15 LAB — POCT INR: INR: 2.8

## 2014-11-26 ENCOUNTER — Encounter (HOSPITAL_COMMUNITY): Admission: RE | Admit: 2014-11-26 | Payer: BLUE CROSS/BLUE SHIELD | Source: Ambulatory Visit

## 2014-12-06 ENCOUNTER — Ambulatory Visit (INDEPENDENT_AMBULATORY_CARE_PROVIDER_SITE_OTHER): Payer: BLUE CROSS/BLUE SHIELD | Admitting: *Deleted

## 2014-12-06 DIAGNOSIS — Z954 Presence of other heart-valve replacement: Secondary | ICD-10-CM

## 2014-12-06 DIAGNOSIS — Z5181 Encounter for therapeutic drug level monitoring: Secondary | ICD-10-CM | POA: Diagnosis not present

## 2014-12-06 DIAGNOSIS — Z7901 Long term (current) use of anticoagulants: Secondary | ICD-10-CM

## 2014-12-06 DIAGNOSIS — I059 Rheumatic mitral valve disease, unspecified: Secondary | ICD-10-CM | POA: Diagnosis not present

## 2014-12-06 DIAGNOSIS — Z952 Presence of prosthetic heart valve: Secondary | ICD-10-CM

## 2014-12-06 LAB — POCT INR: INR: 2.3

## 2014-12-06 MED ORDER — WARFARIN SODIUM 5 MG PO TABS: ORAL_TABLET | ORAL | Status: DC

## 2014-12-07 ENCOUNTER — Ambulatory Visit (INDEPENDENT_AMBULATORY_CARE_PROVIDER_SITE_OTHER): Payer: BLUE CROSS/BLUE SHIELD | Admitting: "Endocrinology

## 2014-12-07 ENCOUNTER — Encounter: Payer: Self-pay | Admitting: "Endocrinology

## 2014-12-07 VITALS — BP 118/74 | HR 87 | Ht 64.0 in | Wt 176.0 lb

## 2014-12-07 DIAGNOSIS — E039 Hypothyroidism, unspecified: Secondary | ICD-10-CM | POA: Insufficient documentation

## 2014-12-07 MED ORDER — LEVOTHYROXINE SODIUM 112 MCG PO TABS: 112.0000 ug | ORAL_TABLET | Freq: Every day | ORAL | Status: DC

## 2014-12-07 NOTE — Progress Notes (Signed)
HPI  [name] is a 44 y.o.-year-old female, here to f/u for hypothyroidism. She is on LT4 131mcg po qam. She is compliant and taking her LT4 properly. She c/o fatigue. She denies heat intolerance.  I reviewed pt's thyroid tests: Lab Results  Component Value Date   TSH 3.291 09/18/2010   TSH 4.397 08/04/2010   TSH 2.798 03/10/2010   TSH 4.722* 02/14/2010   TSH 3.050 02/08/2010   FREET4 1.08 09/18/2010   FREET4 0.78* 08/04/2010   FREET4 1.02 03/10/2010      ROS: Constitutional: no weight gain/loss, no fatigue, no subjective hyperthermia/hypothermia Eyes: no blurry vision, no xerophthalmia ENT: no sore throat, no nodules palpated in throat, no dysphagia/odynophagia, no hoarseness Cardiovascular: no CP/SOB/palpitations/leg swelling Respiratory: no cough/SOB Gastrointestinal: no N/V/D/C Musculoskeletal: no muscle/joint aches Skin: no rashes Neurological: no tremors/numbness/tingling/dizziness Psychiatric: no depression/anxiety  PE: BP 118/74 mmHg  Pulse 87  Ht 5\' 4"  (1.626 m)  Wt 176 lb (79.833 kg)  BMI 30.20 kg/m2  SpO2 96% Wt Readings from Last 3 Encounters:  12/07/14 176 lb (79.833 kg)  11/09/14 175 lb 2 oz (79.436 kg)  08/02/14 172 lb (78.019 kg)   Constitutional: overweight, in NAD Eyes: PERRLA, EOMI, no exophthalmos ENT: moist mucous membranes, no thyromegaly, no cervical lymphadenopathy Cardiovascular: RRR, No MRG Respiratory: CTA B Gastrointestinal: abdomen soft, NT, ND, BS+ Musculoskeletal: no deformities, strength intact in all 4 Skin: moist, warm, no rashes Neurological: no tremor with outstretched hands, DTR normal in all 4  ASSESSMENT: 1. Hypothyroidism  PLAN:    -Patient with long-standing hypothyroidism, on levothyroxine therapy. She will benefit from a slight increase her Lt4. -I will increase LT4 to 112 mcg po qam. - We discussed about correct intake of levothyroxine, at fasting, with water, separated by at least 30 minutes from  breakfast, and separated by more than 4 hours from calcium, iron, multivitamins, acid reflux medications (PPIs). -Patient is made aware of the fact that thyroid hormone replacement is needed for life, dose to be adjusted by periodic monitoring of thyroid function tests. - Will check thyroid tests before next visit: TSH, free T4   Glade Lloyd, MD Phone: [phone]  Fax: [phone]   12/07/2014, 11:11 PM

## 2014-12-10 ENCOUNTER — Encounter (HOSPITAL_COMMUNITY): Admission: RE | Admit: 2014-12-10 | Payer: BLUE CROSS/BLUE SHIELD | Source: Ambulatory Visit

## 2014-12-20 ENCOUNTER — Ambulatory Visit (INDEPENDENT_AMBULATORY_CARE_PROVIDER_SITE_OTHER): Payer: BLUE CROSS/BLUE SHIELD | Admitting: Gastroenterology

## 2014-12-20 ENCOUNTER — Encounter: Payer: Self-pay | Admitting: Gastroenterology

## 2014-12-20 VITALS — BP 109/80 | HR 77 | Temp 98.4°F | Ht 64.0 in | Wt 180.4 lb

## 2014-12-20 DIAGNOSIS — K3184 Gastroparesis: Secondary | ICD-10-CM | POA: Diagnosis not present

## 2014-12-20 DIAGNOSIS — Z6830 Body mass index (BMI) 30.0-30.9, adult: Secondary | ICD-10-CM | POA: Diagnosis not present

## 2014-12-20 DIAGNOSIS — Z1389 Encounter for screening for other disorder: Secondary | ICD-10-CM | POA: Diagnosis not present

## 2014-12-20 DIAGNOSIS — G894 Chronic pain syndrome: Secondary | ICD-10-CM | POA: Diagnosis not present

## 2014-12-20 DIAGNOSIS — E6609 Other obesity due to excess calories: Secondary | ICD-10-CM | POA: Diagnosis not present

## 2014-12-20 DIAGNOSIS — D518 Other vitamin B12 deficiency anemias: Secondary | ICD-10-CM | POA: Diagnosis not present

## 2014-12-20 DIAGNOSIS — Z23 Encounter for immunization: Secondary | ICD-10-CM | POA: Diagnosis not present

## 2014-12-20 MED ORDER — ONDANSETRON HCL 4 MG PO TABS: 4.0000 mg | ORAL_TABLET | Freq: Three times a day (TID) | ORAL | Status: DC | PRN

## 2014-12-20 MED ORDER — METOCLOPRAMIDE HCL 5 MG/5ML PO SOLN: 10.0000 mg | Freq: Three times a day (TID) | ORAL | Status: DC

## 2014-12-20 NOTE — Assessment & Plan Note (Signed)
44 year old female with history of erosive esophagitis and gastroparesis, actually doing quite well now with low dose Reglan, PPI, and prn Phenergan. Has historically done better with Zofran, but this was not covered per patient at last refill. Will attempt to have this covered now; it likely needs a prior authorization. Continue behavior and dietary modifications, and return in 6 months.

## 2014-12-20 NOTE — Patient Instructions (Signed)
I have refilled Reglan. Try to keep this minimal to twice a day, but if needed, you could do up to 4 times per day.   I have sent in Zofran again. Let me know if this is an issue. It may need a prior authorization.   We will see you in 6 months! Have a great holiday season.

## 2014-12-20 NOTE — Progress Notes (Signed)
Referring Provider: [facility] Primary Care Physician:  Purvis Kilts, MD  Primary GI: Dr. Gala Romney   Chief Complaint  Patient presents with  . Follow-up    Doing well/ no complaints    HPI:   [name] is a 44 y.o. female presenting today with a history of erosive esophagitis and gastroparesis. No improvement on Marinol historically. Has done well with Reglan BID, PPI. Zofran not covered by insurance. Phenergan works but makes her drowsy.   Does well overall. Some bad days, especially if she eats late. Hansel Starling does not sit well. Intermittent loose stool, usually depending on dietary choices. No rectal bleeding. Has gained about 10 lbs since last visit. No other GI complaints. Got back from a cruise earlier this year.     Past Medical History  Diagnosis Date  . SLE (systemic lupus erythematosus) ([place])   . BRCA2 negative   . BRCA1 negative   . 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 B12 deficiency   . Mitral valve disease, rheumatic     St. Jude prosthesis  . Raynaud disease   . Fibromyalgia   . Anxiety   . Peripheral neuropathy ([place])   . Anticoagulation goal of INR 2.5 to 3.5   . Breast cancer ([place]) 2011    Left side  . Drug-induced hepatitis     States per her rheumatologist, transaminases elevated but normalized after drug removed for   . Bursitis of right knee     Septic bursitis  . Breast disorder     cancer had left mastectomy in 2012    Past Surgical History  Procedure Laterality Date  . Mitral valve replacement    . Cholecystectomy    . Mastectomy    . Breast surgery    . Endometrial ablation  2008    She no longer has menses  . Insertion expander left breast  11/16/10  . Tissue expander placement  02/14/2011    Procedure: TISSUE EXPANDER;  Surgeon: Macon Large;  Location: [facility];  Service: Plastics;  Laterality: Bilateral;  Left Breast Remove Tissue Expander Placement of Implant Breast  Reconstruction  . Mastopexy  02/14/2011    Procedure: MASTOPEXY;  Surgeon: Macon Large;  Location: [facility];  Service: Plastics;  Laterality: Bilateral;  Right Breast Reduction   . Esophagogastroduodenoscopy N/A 03/25/2013    Dr. Raliegh Scarlet reflux esophagitis-likely source of patient's symptoms (patulous EG junction). Hiatal hernia otherwise normal  . Tubal ligation      Current Outpatient Prescriptions  Medication Sig Dispense Refill  . Albuterol (VENTOLIN IN) Inhale into the lungs as needed.    . ALPRAZolam (XANAX) 0.5 MG tablet Take 0.5 mg by mouth at bedtime.     . cyanocobalamin (,VITAMIN B-12,) 1000 MCG/ML injection Inject 1 mL (1,000 mcg total) into the muscle every 30 (thirty) days. 10 mL prn  . DEXILANT 60 MG capsule TAKE ONE CAPSULE BY MOUTH DAILY. 31 capsule 11  . dicyclomine (BENTYL) 10 MG capsule Take 1 capsule (10 mg total) by mouth 4 (four) times daily -  before meals and at bedtime. 292 capsule 3  . folic acid (FOLVITE) 1 MG tablet Take 1 mg by mouth.    . furosemide (LASIX) 40 MG tablet Take 1 tablet (40 mg total) by mouth daily. 90 tablet 3  . gabapentin (NEURONTIN) 600 MG tablet Take 600 mg by mouth 2 (two) times daily.     . hydroxychloroquine (PLAQUENIL) 200  MG tablet Take 200 mg by mouth 2 (two) times daily.      Marland Kitchen ibuprofen (ADVIL,MOTRIN) 200 MG tablet Take 800 mg by mouth every 6 (six) hours as needed for headache.    . levothyroxine (SYNTHROID, LEVOTHROID) 112 MCG tablet Take 1 tablet (112 mcg total) by mouth daily before breakfast. 30 tablet 6  . metoCLOPramide (REGLAN) 5 MG/5ML solution Take 10 mLs (10 mg total) by mouth 4 (four) times daily -  before meals and at bedtime. 240 mL 3  . oxyCODONE-acetaminophen (PERCOCET) 7.5-325 MG per tablet Take 1 tablet by mouth every 4 (four) hours as needed for pain.    . potassium chloride (KLOR-CON 10) 10 MEQ tablet Take one tablet daily as directed    . promethazine (PHENERGAN) 25 MG tablet Take 1 tablet (25 mg total) by  mouth every 6 (six) hours as needed for nausea or vomiting. 30 tablet 3  . riTUXimab (RITUXAN) 100 MG/10ML injection Inject into the vein. Every 6 months    . rOPINIRole (REQUIP) 0.5 MG tablet Take 1 mg by mouth at bedtime.     Marland Kitchen warfarin (COUMADIN) 5 MG tablet TAKE 1 1/2 TABLETS BY MOUTH DAILY EXCEPT 1 TABLET ON MONDAYS AND THURSDAYS OR AS DIRECTED. 60 tablet 3  . zolpidem (AMBIEN) 10 MG tablet Take 10 mg by mouth at bedtime as needed for sleep.     Marland Kitchen ondansetron (ZOFRAN) 4 MG tablet Take 1 tablet (4 mg total) by mouth 3 (three) times daily with meals. (Patient not taking: Reported on 12/20/2014) 90 tablet 1   No current facility-administered medications for this visit.    Allergies as of 12/20/2014  . (No Known Allergies)    Family History  Problem Relation Age of Onset  . Colon cancer Neg Hx   . Hypertension Mother   . Hyperlipidemia Mother   . Hypertension Father     Social History   Social History  . Marital Status: Married    Spouse Name: N/A  . Number of Children: 2  . Years of Education: N/A   Social History Main Topics  . Smoking status: Former Smoker -- 1.00 packs/day for 15 years    Types: Cigarettes    Quit date: 02/19/2006  . Smokeless tobacco: Never Used     Comment: Quit x 10 years  . Alcohol Use: 0.0 oz/week    0 Standard drinks or equivalent per week     Comment: Occasional  . Drug Use: No  . Sexual Activity: Yes    Birth Control/ Protection: Surgical     Comment: tubal   Other Topics Concern  . None   Social History Narrative    Review of Systems: Negative unless mentioned in HPI   Physical Exam: BP 109/80 mmHg  Pulse 77  Temp(Src) 98.4 F (36.9 C) (Oral)  Ht 5' 4"  (1.626 m)  Wt 180 lb 6.4 oz (81.829 kg)  BMI 30.95 kg/m2 General:   Alert and oriented. No distress noted. Pleasant and cooperative.  Head:  Normocephalic and atraumatic. Eyes:  Conjuctiva clear without scleral icterus. Abdomen:  +BS, soft, non-tender and non-distended. No  rebound or guarding. No HSM or masses noted. Msk:  Symmetrical without gross deformities. Normal posture. Extremities:  Without edema. Neurologic:  Alert and  oriented x4;  grossly normal neurologically. Psych:  Alert and cooperative. Normal mood and affect.

## 2014-12-21 NOTE — Progress Notes (Signed)
CC'D TO PCP °

## 2014-12-22 ENCOUNTER — Ambulatory Visit (INDEPENDENT_AMBULATORY_CARE_PROVIDER_SITE_OTHER): Payer: BLUE CROSS/BLUE SHIELD | Admitting: Cardiology

## 2014-12-22 ENCOUNTER — Encounter: Payer: Self-pay | Admitting: Cardiology

## 2014-12-22 ENCOUNTER — Ambulatory Visit (INDEPENDENT_AMBULATORY_CARE_PROVIDER_SITE_OTHER): Payer: BLUE CROSS/BLUE SHIELD | Admitting: *Deleted

## 2014-12-22 VITALS — BP 118/70 | HR 66 | Ht 64.0 in | Wt 178.0 lb

## 2014-12-22 DIAGNOSIS — Z5181 Encounter for therapeutic drug level monitoring: Secondary | ICD-10-CM

## 2014-12-22 DIAGNOSIS — Z09 Encounter for follow-up examination after completed treatment for conditions other than malignant neoplasm: Secondary | ICD-10-CM | POA: Diagnosis not present

## 2014-12-22 DIAGNOSIS — Z7901 Long term (current) use of anticoagulants: Secondary | ICD-10-CM | POA: Diagnosis not present

## 2014-12-22 DIAGNOSIS — I059 Rheumatic mitral valve disease, unspecified: Secondary | ICD-10-CM

## 2014-12-22 DIAGNOSIS — Z952 Presence of prosthetic heart valve: Secondary | ICD-10-CM

## 2014-12-22 DIAGNOSIS — Z954 Presence of other heart-valve replacement: Secondary | ICD-10-CM

## 2014-12-22 DIAGNOSIS — Z136 Encounter for screening for cardiovascular disorders: Secondary | ICD-10-CM | POA: Diagnosis not present

## 2014-12-22 LAB — POCT INR: INR: 2.1

## 2014-12-22 NOTE — Progress Notes (Signed)
Cardiology Office Note  Date: 12/22/2014   ID: [name], DOB 1971/01/05, MRN [id number]  PCP: Purvis Kilts, MD  Primary Cardiologist: Rozann Lesches, MD   Chief Complaint  Patient presents with  . Mitral valve disease    History of Present Illness: [name] is a 44 y.o. female last seen in April. She presents for a routine follow-up visit. From a cardiac perspective, no new symptoms, specifically stable NYHA class II dyspnea, no palpitations or chest pain.  She continues to follow with oncology, treated with Rituxan for history of breast cancer. She states that she has had no problems with her treatments. Her last echocardiogram was 6 months ago, as outlined below.  She continues on Coumadin, today's INR 2.1. Reports no bleeding problems.   Past Medical History  Diagnosis Date  . SLE (systemic lupus erythematosus) ([place])   . BRCA2 negative   . BRCA1 negative   . 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 B12 deficiency   . Mitral valve disease, rheumatic     St. Jude prosthesis  . Raynaud disease   . Fibromyalgia   . Anxiety   . Peripheral neuropathy ([place])   . Anticoagulation goal of INR 2.5 to 3.5   . Breast cancer ([place]) 2011    Left side  . Drug-induced hepatitis     States per her rheumatologist, transaminases elevated but normalized after drug removed for   . Bursitis of right knee     Septic bursitis    Current Outpatient Prescriptions  Medication Sig Dispense Refill  . Albuterol (VENTOLIN IN) Inhale into the lungs as needed.    . ALPRAZolam (XANAX) 0.5 MG tablet Take 0.5 mg by mouth at bedtime.     . cyanocobalamin (,VITAMIN B-12,) 1000 MCG/ML injection Inject 1 mL (1,000 mcg total) into the muscle every 30 (thirty) days. 10 mL prn  . DEXILANT 60 MG capsule TAKE ONE CAPSULE BY MOUTH DAILY. 31 capsule 11  . folic acid (FOLVITE) 1 MG tablet Take 1 mg by mouth.    . furosemide (LASIX) 40 MG tablet Take 1 tablet  (40 mg total) by mouth daily. 90 tablet 3  . gabapentin (NEURONTIN) 600 MG tablet Take 600 mg by mouth 2 (two) times daily.     . hydroxychloroquine (PLAQUENIL) 200 MG tablet Take 200 mg by mouth 2 (two) times daily.      Marland Kitchen ibuprofen (ADVIL,MOTRIN) 200 MG tablet Take 800 mg by mouth every 6 (six) hours as needed for headache.    . levothyroxine (SYNTHROID, LEVOTHROID) 112 MCG tablet Take 1 tablet (112 mcg total) by mouth daily before breakfast. 30 tablet 6  . metoCLOPramide (REGLAN) 5 MG/5ML solution Take 10 mLs (10 mg total) by mouth 4 (four) times daily -  before meals and at bedtime. 240 mL 3  . ondansetron (ZOFRAN) 4 MG tablet Take 1 tablet (4 mg total) by mouth every 8 (eight) hours as needed for nausea or vomiting. 60 tablet 3  . oxyCODONE-acetaminophen (PERCOCET) 7.5-325 MG per tablet Take 1 tablet by mouth every 4 (four) hours as needed for pain.    . potassium chloride (KLOR-CON 10) 10 MEQ tablet Take one tablet daily as directed    . promethazine (PHENERGAN) 25 MG tablet Take 1 tablet (25 mg total) by mouth every 6 (six) hours as needed for nausea or vomiting. 30 tablet 3  . riTUXimab (RITUXAN) 100 MG/10ML injection Inject into the vein. Every 6 months    .  rOPINIRole (REQUIP) 0.5 MG tablet Take 1 mg by mouth at bedtime.     Marland Kitchen warfarin (COUMADIN) 5 MG tablet TAKE 1 1/2 TABLETS BY MOUTH DAILY EXCEPT 1 TABLET ON MONDAYS AND THURSDAYS OR AS DIRECTED. 60 tablet 3  . zolpidem (AMBIEN) 10 MG tablet Take 10 mg by mouth at bedtime as needed for sleep.      No current facility-administered medications for this visit.    Allergies:  Review of patient's allergies indicates no known allergies.   Social History: The patient  reports that she quit smoking about 8 years ago. Her smoking use included Cigarettes. She has a 15 pack-year smoking history. She has never used smokeless tobacco. She reports that she drinks alcohol. She reports that she does not use illicit drugs.   ROS:  Please see the  history of present illness. Otherwise, complete review of systems is positive for none.  All other systems are reviewed and negative.   Physical Exam: VS:  BP 118/70 mmHg  Pulse 66  Ht 5' 4"  (1.626 m)  Wt 178 lb (80.74 kg)  BMI 30.54 kg/m2, BMI Body mass index is 30.54 kg/(m^2).  Wt Readings from Last 3 Encounters:  12/22/14 178 lb (80.74 kg)  12/20/14 180 lb 6.4 oz (81.829 kg)  12/07/14 176 lb (79.833 kg)    Appears comfortable at rest.  HEENT: Conjunctiva and lids normal, oropharynx clear.  Neck: Supple, no elevated JVP or carotid bruits, no thyromegaly.  Lungs: Clear to auscultation, nonlabored breathing at rest.  Cardiac: Regular rate and rhythm, crisp prosthetic sound in S1, no S3 or significant systolic murmur, no pericardial rub.  Abdomen: Soft, nontender, bowel sounds present, no guarding or rebound.  Extremities: No pitting edema, distal pulses 2+.    ECG: ECG is ordered today and shows sinus rhythm with incomplete right bundle branch block.  Other Studies Reviewed Today:  Echocardiogram 06/01/2014: Study Conclusions  - Left ventricle: The cavity size was normal. Wall thickness was normal. Systolic function was normal. The estimated ejection fraction was in the range of 55% to 60%. Wall motion was normal; there were no regional wall motion abnormalities. Features are consistent with a pseudonormal left ventricular filling pattern, with concomitant abnormal relaxation and increased filling pressure (grade 2 diastolic dysfunction). - Mitral valve: A St. Jude [employer] mechanical prosthesis was present. There was trivial regurgitation. Mean gradient (D): 2 mm Hg. - Left atrium: The atrium was moderately dilated. - Right atrium: Central venous pressure (est): 3 mm Hg. - Tricuspid valve: There was trivial regurgitation. - Pulmonary arteries: Systolic pressure could not be accurately estimated. - Pericardium, extracardiac: There was no pericardial  effusion.  Impressions:  - Normal LV wall thickness with LVEF 16-10%, grade 2 diastolic dysfunction. Moderate left atrial enlargement. Stable St. Jude mitral prosthesis with trivial mtiral regurgitation. Unable to assess PASP.   ASSESSMENT AND PLAN:  1. Rheumatic mitral valve disease status post St. Jude MVR. She is clinically stable and continues on Coumadin.  2. Breast cancer, undergoing treatments with Rituxan per oncology. A follow-up echocardiogram will be obtained to ensure stability in LVEF.  Current medicines were reviewed at length with the patient today.   Orders Placed This Encounter  Procedures  . EKG 12-Lead  . Echocardiogram    Disposition: FU with me in 6 months.   Signed, Satira Sark, MD, [facility] 12/22/2014 10:26 AM    [facility] [address], [place], [place] [address] Phone: [phone]; Fax: [phone]  [phone]

## 2014-12-22 NOTE — Patient Instructions (Signed)
Medication Instructions:  Your physician recommends that you continue on your current medications as directed. Please refer to the Current Medication list given to you today.   Labwork: none  Testing/Procedures: Your physician has requested that you have an echocardiogram. Echocardiography is a painless test that uses sound waves to create images of your heart. It provides your doctor with information about the size and shape of your heart and how well your heart's chambers and valves are working. This procedure takes approximately one hour. There are no restrictions for this procedure.    Follow-Up: Your physician wants you to follow-up in: 6 months with DR. MCDOWELL. You will receive a reminder letter in the mail two months in advance. If you don't receive a letter, please call our office to schedule the follow-up appointment.   Any Other Special Instructions Will Be Listed Below (If Applicable).       If you need a refill on your cardiac medications before your next appointment, please call your pharmacy. Thanks for choosing [facility]!!!

## 2014-12-27 ENCOUNTER — Ambulatory Visit (HOSPITAL_COMMUNITY)
Admission: RE | Admit: 2014-12-27 | Discharge: 2014-12-27 | Disposition: A | Discharge status: Home / Self Care | Payer: BLUE CROSS/BLUE SHIELD | Source: Ambulatory Visit | Attending: Cardiology | Admitting: Cardiology

## 2014-12-27 ENCOUNTER — Telehealth: Payer: Self-pay | Admitting: Cardiology

## 2014-12-27 DIAGNOSIS — Z08 Encounter for follow-up examination after completed treatment for malignant neoplasm: Secondary | ICD-10-CM | POA: Insufficient documentation

## 2014-12-27 DIAGNOSIS — R0789 Other chest pain: Secondary | ICD-10-CM | POA: Insufficient documentation

## 2014-12-27 DIAGNOSIS — Z09 Encounter for follow-up examination after completed treatment for conditions other than malignant neoplasm: Secondary | ICD-10-CM

## 2014-12-27 DIAGNOSIS — R079 Chest pain, unspecified: Secondary | ICD-10-CM | POA: Diagnosis not present

## 2015-01-10 ENCOUNTER — Ambulatory Visit (INDEPENDENT_AMBULATORY_CARE_PROVIDER_SITE_OTHER): Payer: BLUE CROSS/BLUE SHIELD | Admitting: *Deleted

## 2015-01-10 DIAGNOSIS — Z7901 Long term (current) use of anticoagulants: Secondary | ICD-10-CM | POA: Diagnosis not present

## 2015-01-10 DIAGNOSIS — I059 Rheumatic mitral valve disease, unspecified: Secondary | ICD-10-CM

## 2015-01-10 DIAGNOSIS — Z954 Presence of other heart-valve replacement: Secondary | ICD-10-CM

## 2015-01-10 DIAGNOSIS — Z952 Presence of prosthetic heart valve: Secondary | ICD-10-CM

## 2015-01-10 DIAGNOSIS — Z5181 Encounter for therapeutic drug level monitoring: Secondary | ICD-10-CM | POA: Diagnosis not present

## 2015-01-10 LAB — POCT INR: INR: 2.3

## 2015-01-18 DIAGNOSIS — G894 Chronic pain syndrome: Secondary | ICD-10-CM | POA: Diagnosis not present

## 2015-01-18 DIAGNOSIS — D519 Vitamin B12 deficiency anemia, unspecified: Secondary | ICD-10-CM | POA: Diagnosis not present

## 2015-01-18 DIAGNOSIS — Z1389 Encounter for screening for other disorder: Secondary | ICD-10-CM | POA: Diagnosis not present

## 2015-01-18 DIAGNOSIS — E6609 Other obesity due to excess calories: Secondary | ICD-10-CM | POA: Diagnosis not present

## 2015-01-18 DIAGNOSIS — Z6830 Body mass index (BMI) 30.0-30.9, adult: Secondary | ICD-10-CM | POA: Diagnosis not present

## 2015-01-24 ENCOUNTER — Ambulatory Visit (INDEPENDENT_AMBULATORY_CARE_PROVIDER_SITE_OTHER): Payer: BLUE CROSS/BLUE SHIELD | Admitting: *Deleted

## 2015-01-24 DIAGNOSIS — Z954 Presence of other heart-valve replacement: Secondary | ICD-10-CM | POA: Diagnosis not present

## 2015-01-24 DIAGNOSIS — I059 Rheumatic mitral valve disease, unspecified: Secondary | ICD-10-CM | POA: Diagnosis not present

## 2015-01-24 DIAGNOSIS — Z5181 Encounter for therapeutic drug level monitoring: Secondary | ICD-10-CM

## 2015-01-24 DIAGNOSIS — Z7901 Long term (current) use of anticoagulants: Secondary | ICD-10-CM

## 2015-01-24 DIAGNOSIS — Z952 Presence of prosthetic heart valve: Secondary | ICD-10-CM

## 2015-01-24 LAB — POCT INR: INR: 4.1

## 2015-02-07 ENCOUNTER — Ambulatory Visit (INDEPENDENT_AMBULATORY_CARE_PROVIDER_SITE_OTHER): Payer: BLUE CROSS/BLUE SHIELD | Admitting: *Deleted

## 2015-02-07 DIAGNOSIS — Z7901 Long term (current) use of anticoagulants: Secondary | ICD-10-CM | POA: Diagnosis not present

## 2015-02-07 DIAGNOSIS — Z5181 Encounter for therapeutic drug level monitoring: Secondary | ICD-10-CM | POA: Diagnosis not present

## 2015-02-07 DIAGNOSIS — I059 Rheumatic mitral valve disease, unspecified: Secondary | ICD-10-CM | POA: Diagnosis not present

## 2015-02-07 DIAGNOSIS — Z954 Presence of other heart-valve replacement: Secondary | ICD-10-CM

## 2015-02-07 DIAGNOSIS — Z952 Presence of prosthetic heart valve: Secondary | ICD-10-CM

## 2015-02-07 LAB — POCT INR: INR: 2.4

## 2015-02-10 DIAGNOSIS — E538 Deficiency of other specified B group vitamins: Secondary | ICD-10-CM | POA: Diagnosis not present

## 2015-02-10 DIAGNOSIS — Z6830 Body mass index (BMI) 30.0-30.9, adult: Secondary | ICD-10-CM | POA: Diagnosis not present

## 2015-02-10 DIAGNOSIS — Z1389 Encounter for screening for other disorder: Secondary | ICD-10-CM | POA: Diagnosis not present

## 2015-02-10 DIAGNOSIS — G894 Chronic pain syndrome: Secondary | ICD-10-CM | POA: Diagnosis not present

## 2015-02-10 DIAGNOSIS — E039 Hypothyroidism, unspecified: Secondary | ICD-10-CM | POA: Diagnosis not present

## 2015-02-14 ENCOUNTER — Other Ambulatory Visit: Payer: Self-pay | Admitting: Cardiology

## 2015-03-03 ENCOUNTER — Other Ambulatory Visit (HOSPITAL_COMMUNITY): Payer: Self-pay | Admitting: Family Medicine

## 2015-03-03 DIAGNOSIS — Z1231 Encounter for screening mammogram for malignant neoplasm of breast: Secondary | ICD-10-CM

## 2015-03-09 ENCOUNTER — Ambulatory Visit (INDEPENDENT_AMBULATORY_CARE_PROVIDER_SITE_OTHER): Payer: BLUE CROSS/BLUE SHIELD | Admitting: Pharmacist

## 2015-03-09 DIAGNOSIS — I059 Rheumatic mitral valve disease, unspecified: Secondary | ICD-10-CM | POA: Diagnosis not present

## 2015-03-09 DIAGNOSIS — Z7901 Long term (current) use of anticoagulants: Secondary | ICD-10-CM | POA: Diagnosis not present

## 2015-03-09 DIAGNOSIS — Z954 Presence of other heart-valve replacement: Secondary | ICD-10-CM | POA: Diagnosis not present

## 2015-03-09 DIAGNOSIS — Z5181 Encounter for therapeutic drug level monitoring: Secondary | ICD-10-CM | POA: Diagnosis not present

## 2015-03-09 DIAGNOSIS — Z952 Presence of prosthetic heart valve: Secondary | ICD-10-CM

## 2015-03-09 LAB — POCT INR: INR: 2

## 2015-03-18 DIAGNOSIS — E6609 Other obesity due to excess calories: Secondary | ICD-10-CM | POA: Diagnosis not present

## 2015-03-18 DIAGNOSIS — Z6831 Body mass index (BMI) 31.0-31.9, adult: Secondary | ICD-10-CM | POA: Diagnosis not present

## 2015-03-18 DIAGNOSIS — Z1389 Encounter for screening for other disorder: Secondary | ICD-10-CM | POA: Diagnosis not present

## 2015-03-18 DIAGNOSIS — D510 Vitamin B12 deficiency anemia due to intrinsic factor deficiency: Secondary | ICD-10-CM | POA: Diagnosis not present

## 2015-03-18 DIAGNOSIS — G894 Chronic pain syndrome: Secondary | ICD-10-CM | POA: Diagnosis not present

## 2015-03-23 ENCOUNTER — Ambulatory Visit (INDEPENDENT_AMBULATORY_CARE_PROVIDER_SITE_OTHER): Payer: BLUE CROSS/BLUE SHIELD | Admitting: *Deleted

## 2015-03-23 ENCOUNTER — Ambulatory Visit (HOSPITAL_COMMUNITY)
Admission: RE | Admit: 2015-03-23 | Discharge: 2015-03-23 | Disposition: A | Discharge status: Home / Self Care | Payer: BLUE CROSS/BLUE SHIELD | Source: Ambulatory Visit | Attending: Family Medicine | Admitting: Family Medicine

## 2015-03-23 DIAGNOSIS — Z1231 Encounter for screening mammogram for malignant neoplasm of breast: Secondary | ICD-10-CM | POA: Diagnosis not present

## 2015-03-23 DIAGNOSIS — Z5181 Encounter for therapeutic drug level monitoring: Secondary | ICD-10-CM | POA: Diagnosis not present

## 2015-03-23 DIAGNOSIS — I059 Rheumatic mitral valve disease, unspecified: Secondary | ICD-10-CM | POA: Diagnosis not present

## 2015-03-23 DIAGNOSIS — Z952 Presence of prosthetic heart valve: Secondary | ICD-10-CM

## 2015-03-23 DIAGNOSIS — Z954 Presence of other heart-valve replacement: Secondary | ICD-10-CM | POA: Diagnosis not present

## 2015-03-23 DIAGNOSIS — Z7901 Long term (current) use of anticoagulants: Secondary | ICD-10-CM

## 2015-03-23 LAB — POCT INR: INR: 1.5

## 2015-03-30 ENCOUNTER — Ambulatory Visit (INDEPENDENT_AMBULATORY_CARE_PROVIDER_SITE_OTHER): Payer: BLUE CROSS/BLUE SHIELD | Admitting: *Deleted

## 2015-03-30 DIAGNOSIS — Z952 Presence of prosthetic heart valve: Secondary | ICD-10-CM

## 2015-03-30 DIAGNOSIS — Z5181 Encounter for therapeutic drug level monitoring: Secondary | ICD-10-CM

## 2015-03-30 DIAGNOSIS — Z7901 Long term (current) use of anticoagulants: Secondary | ICD-10-CM

## 2015-03-30 DIAGNOSIS — Z954 Presence of other heart-valve replacement: Secondary | ICD-10-CM | POA: Diagnosis not present

## 2015-03-30 DIAGNOSIS — I059 Rheumatic mitral valve disease, unspecified: Secondary | ICD-10-CM | POA: Diagnosis not present

## 2015-03-30 LAB — POCT INR: INR: 2.3

## 2015-03-31 DIAGNOSIS — Z79899 Other long term (current) drug therapy: Secondary | ICD-10-CM | POA: Diagnosis not present

## 2015-04-12 DIAGNOSIS — M329 Systemic lupus erythematosus, unspecified: Secondary | ICD-10-CM | POA: Diagnosis not present

## 2015-04-13 ENCOUNTER — Ambulatory Visit (INDEPENDENT_AMBULATORY_CARE_PROVIDER_SITE_OTHER): Payer: BLUE CROSS/BLUE SHIELD | Admitting: *Deleted

## 2015-04-13 DIAGNOSIS — Z5181 Encounter for therapeutic drug level monitoring: Secondary | ICD-10-CM

## 2015-04-13 DIAGNOSIS — I059 Rheumatic mitral valve disease, unspecified: Secondary | ICD-10-CM

## 2015-04-13 DIAGNOSIS — Z954 Presence of other heart-valve replacement: Secondary | ICD-10-CM | POA: Diagnosis not present

## 2015-04-13 DIAGNOSIS — Z7901 Long term (current) use of anticoagulants: Secondary | ICD-10-CM

## 2015-04-13 DIAGNOSIS — Z952 Presence of prosthetic heart valve: Secondary | ICD-10-CM

## 2015-04-13 LAB — POCT INR: INR: 1.9

## 2015-04-14 DIAGNOSIS — E538 Deficiency of other specified B group vitamins: Secondary | ICD-10-CM | POA: Diagnosis not present

## 2015-04-14 DIAGNOSIS — Z1389 Encounter for screening for other disorder: Secondary | ICD-10-CM | POA: Diagnosis not present

## 2015-04-14 DIAGNOSIS — G894 Chronic pain syndrome: Secondary | ICD-10-CM | POA: Diagnosis not present

## 2015-04-14 DIAGNOSIS — Z6831 Body mass index (BMI) 31.0-31.9, adult: Secondary | ICD-10-CM | POA: Diagnosis not present

## 2015-04-20 ENCOUNTER — Encounter (HOSPITAL_COMMUNITY)
Admission: RE | Admit: 2015-04-20 | Discharge: 2015-04-20 | Disposition: A | Discharge status: Home / Self Care | Payer: BLUE CROSS/BLUE SHIELD | Source: Ambulatory Visit | Attending: Rheumatology | Admitting: Rheumatology

## 2015-04-20 DIAGNOSIS — M0609 Rheumatoid arthritis without rheumatoid factor, multiple sites: Secondary | ICD-10-CM | POA: Diagnosis not present

## 2015-04-20 MED ORDER — DIPHENHYDRAMINE HCL 50 MG/ML IJ SOLN
25.0000 mg | Freq: Once | INTRAMUSCULAR | Status: AC
  Administered 2015-04-20: 25 mg via INTRAVENOUS

## 2015-04-20 MED ORDER — DIPHENHYDRAMINE HCL 50 MG/ML IJ SOLN
INTRAMUSCULAR | Status: AC
  Filled 2015-04-20: qty 1

## 2015-04-20 MED ORDER — SODIUM CHLORIDE 0.9 % IV SOLN
1000.0000 mg | Freq: Once | INTRAVENOUS | Status: DC
  Filled 2015-04-20: qty 100

## 2015-04-20 MED ORDER — METHYLPREDNISOLONE SODIUM SUCC 125 MG IJ SOLR
INTRAMUSCULAR | Status: AC
  Filled 2015-04-20: qty 2

## 2015-04-20 MED ORDER — ACETAMINOPHEN 325 MG PO TABS
650.0000 mg | ORAL_TABLET | Freq: Once | ORAL | Status: AC
  Administered 2015-04-20: 650 mg via ORAL

## 2015-04-20 MED ORDER — ACETAMINOPHEN 325 MG PO TABS
ORAL_TABLET | ORAL | Status: AC
  Filled 2015-04-20: qty 2

## 2015-04-20 MED ORDER — LORATADINE 10 MG PO TABS
10.0000 mg | ORAL_TABLET | Freq: Every day | ORAL | Status: DC
  Administered 2015-04-20: 10 mg via ORAL

## 2015-04-20 MED ORDER — METHYLPREDNISOLONE SODIUM SUCC 125 MG IJ SOLR
100.0000 mg | Freq: Once | INTRAMUSCULAR | Status: AC
  Administered 2015-04-20: 100 mg via INTRAVENOUS
  Filled 2015-04-20: qty 2

## 2015-04-20 MED ORDER — LORATADINE 10 MG PO TABS
ORAL_TABLET | ORAL | Status: AC
  Filled 2015-04-20: qty 1

## 2015-04-20 MED ORDER — SODIUM CHLORIDE 0.9 % IV SOLN
INTRAVENOUS | Status: DC
  Administered 2015-04-20: 10 mL/h via INTRAVENOUS

## 2015-04-20 NOTE — Progress Notes (Signed)
[facility] and spoke with Dr. Trudie Reed nurse regarding documentation of TB testing and hepatitis screen for patient. Spoke with Linus Galas who faxed lab results that did not include above test results. Called office and left message for results to be faxed to short stay clinic.

## 2015-04-22 ENCOUNTER — Encounter (HOSPITAL_COMMUNITY)
Admission: RE | Admit: 2015-04-22 | Discharge: 2015-04-22 | Disposition: A | Discharge status: Home / Self Care | Payer: BLUE CROSS/BLUE SHIELD | Source: Ambulatory Visit | Attending: Rheumatology | Admitting: Rheumatology

## 2015-04-22 DIAGNOSIS — M0609 Rheumatoid arthritis without rheumatoid factor, multiple sites: Secondary | ICD-10-CM | POA: Diagnosis not present

## 2015-04-23 LAB — HEPATITIS PANEL, ACUTE
HCV AB: 0.1 {s_co_ratio} (ref 0.0–0.9)
HEP A IGM: NEGATIVE
HEP B S AG: NEGATIVE
Hep B C IgM: NEGATIVE

## 2015-04-25 ENCOUNTER — Ambulatory Visit (INDEPENDENT_AMBULATORY_CARE_PROVIDER_SITE_OTHER): Payer: BLUE CROSS/BLUE SHIELD | Admitting: *Deleted

## 2015-04-25 DIAGNOSIS — I059 Rheumatic mitral valve disease, unspecified: Secondary | ICD-10-CM | POA: Diagnosis not present

## 2015-04-25 DIAGNOSIS — Z952 Presence of prosthetic heart valve: Secondary | ICD-10-CM

## 2015-04-25 DIAGNOSIS — Z7901 Long term (current) use of anticoagulants: Secondary | ICD-10-CM | POA: Diagnosis not present

## 2015-04-25 DIAGNOSIS — Z5181 Encounter for therapeutic drug level monitoring: Secondary | ICD-10-CM

## 2015-04-25 DIAGNOSIS — Z954 Presence of other heart-valve replacement: Secondary | ICD-10-CM | POA: Diagnosis not present

## 2015-04-25 LAB — POCT INR: INR: 3

## 2015-04-26 LAB — QUANTIFERON TB GOLD ASSAY (BLOOD)

## 2015-04-26 LAB — QUANTIFERON IN TUBE
QFT TB AG MINUS NIL VALUE: <0 IU/mL
QUANTIFERON NIL VALUE: 0.04 [IU]/mL
QUANTIFERON TB AG VALUE: 0.03 [IU]/mL
QUANTIFERON TB GOLD: NEGATIVE

## 2015-05-04 ENCOUNTER — Encounter (HOSPITAL_COMMUNITY)
Admission: RE | Admit: 2015-05-04 | Discharge: 2015-05-04 | Disposition: A | Discharge status: Home / Self Care | Payer: BLUE CROSS/BLUE SHIELD | Source: Ambulatory Visit | Attending: Rheumatology | Admitting: Rheumatology

## 2015-05-04 DIAGNOSIS — M0609 Rheumatoid arthritis without rheumatoid factor, multiple sites: Secondary | ICD-10-CM | POA: Diagnosis not present

## 2015-05-04 MED ORDER — ACETAMINOPHEN 325 MG PO TABS
650.0000 mg | ORAL_TABLET | Freq: Once | ORAL | Status: AC
  Administered 2015-05-04: 650 mg via ORAL
  Filled 2015-05-04: qty 2

## 2015-05-04 MED ORDER — DIPHENHYDRAMINE HCL 50 MG/ML IJ SOLN
25.0000 mg | Freq: Once | INTRAMUSCULAR | Status: AC
  Administered 2015-05-04: 25 mg via INTRAVENOUS
  Filled 2015-05-04: qty 1

## 2015-05-04 MED ORDER — METHYLPREDNISOLONE SODIUM SUCC 125 MG IJ SOLR
100.0000 mg | Freq: Once | INTRAMUSCULAR | Status: AC
  Administered 2015-05-04: 100 mg via INTRAVENOUS
  Filled 2015-05-04: qty 2

## 2015-05-04 MED ORDER — LORATADINE 10 MG PO TABS
10.0000 mg | ORAL_TABLET | Freq: Once | ORAL | Status: AC
  Administered 2015-05-04: 10 mg via ORAL
  Filled 2015-05-04: qty 1

## 2015-05-04 MED ORDER — SODIUM CHLORIDE 0.9 % IV SOLN
1000.0000 mg | Freq: Once | INTRAVENOUS | Status: AC
  Administered 2015-05-04: 1000 mg via INTRAVENOUS
  Filled 2015-05-04: qty 100

## 2015-05-04 MED ORDER — SODIUM CHLORIDE 0.9 % IV SOLN: INTRAVENOUS | Status: DC

## 2015-05-09 ENCOUNTER — Ambulatory Visit (INDEPENDENT_AMBULATORY_CARE_PROVIDER_SITE_OTHER): Payer: BLUE CROSS/BLUE SHIELD | Admitting: *Deleted

## 2015-05-09 ENCOUNTER — Other Ambulatory Visit: Payer: Self-pay | Admitting: Cardiology

## 2015-05-09 DIAGNOSIS — I059 Rheumatic mitral valve disease, unspecified: Secondary | ICD-10-CM

## 2015-05-09 DIAGNOSIS — Z5181 Encounter for therapeutic drug level monitoring: Secondary | ICD-10-CM | POA: Diagnosis not present

## 2015-05-09 DIAGNOSIS — Z952 Presence of prosthetic heart valve: Secondary | ICD-10-CM

## 2015-05-09 DIAGNOSIS — Z954 Presence of other heart-valve replacement: Secondary | ICD-10-CM | POA: Diagnosis not present

## 2015-05-09 DIAGNOSIS — Z7901 Long term (current) use of anticoagulants: Secondary | ICD-10-CM

## 2015-05-09 LAB — POCT INR: INR: 2.6

## 2015-05-12 DIAGNOSIS — J112 Influenza due to unidentified influenza virus with gastrointestinal manifestations: Secondary | ICD-10-CM | POA: Diagnosis not present

## 2015-05-12 DIAGNOSIS — E538 Deficiency of other specified B group vitamins: Secondary | ICD-10-CM | POA: Diagnosis not present

## 2015-05-12 DIAGNOSIS — Z6830 Body mass index (BMI) 30.0-30.9, adult: Secondary | ICD-10-CM | POA: Diagnosis not present

## 2015-05-12 DIAGNOSIS — Z1389 Encounter for screening for other disorder: Secondary | ICD-10-CM | POA: Diagnosis not present

## 2015-05-12 DIAGNOSIS — J209 Acute bronchitis, unspecified: Secondary | ICD-10-CM | POA: Diagnosis not present

## 2015-05-12 DIAGNOSIS — G894 Chronic pain syndrome: Secondary | ICD-10-CM | POA: Diagnosis not present

## 2015-05-18 DIAGNOSIS — C50412 Malignant neoplasm of upper-outer quadrant of left female breast: Secondary | ICD-10-CM | POA: Diagnosis not present

## 2015-05-18 DIAGNOSIS — D591 Other autoimmune hemolytic anemias: Secondary | ICD-10-CM | POA: Diagnosis not present

## 2015-05-30 ENCOUNTER — Ambulatory Visit (INDEPENDENT_AMBULATORY_CARE_PROVIDER_SITE_OTHER): Payer: BLUE CROSS/BLUE SHIELD | Admitting: *Deleted

## 2015-05-30 DIAGNOSIS — Z954 Presence of other heart-valve replacement: Secondary | ICD-10-CM

## 2015-05-30 DIAGNOSIS — Z7901 Long term (current) use of anticoagulants: Secondary | ICD-10-CM

## 2015-05-30 DIAGNOSIS — Z952 Presence of prosthetic heart valve: Secondary | ICD-10-CM

## 2015-05-30 DIAGNOSIS — I059 Rheumatic mitral valve disease, unspecified: Secondary | ICD-10-CM | POA: Diagnosis not present

## 2015-05-30 DIAGNOSIS — Z5181 Encounter for therapeutic drug level monitoring: Secondary | ICD-10-CM | POA: Diagnosis not present

## 2015-05-30 LAB — POCT INR: INR: 3.4

## 2015-06-03 ENCOUNTER — Other Ambulatory Visit: Payer: Self-pay | Admitting: "Endocrinology

## 2015-06-03 DIAGNOSIS — E039 Hypothyroidism, unspecified: Secondary | ICD-10-CM | POA: Diagnosis not present

## 2015-06-03 LAB — TSH: TSH: 0.13 m[IU]/L — AB

## 2015-06-03 LAB — T4, FREE: Free T4: 1.6 ng/dL (ref 0.8–1.8)

## 2015-06-10 ENCOUNTER — Ambulatory Visit (INDEPENDENT_AMBULATORY_CARE_PROVIDER_SITE_OTHER): Payer: BLUE CROSS/BLUE SHIELD | Admitting: "Endocrinology

## 2015-06-10 ENCOUNTER — Encounter: Payer: Self-pay | Admitting: "Endocrinology

## 2015-06-10 VITALS — BP 115/79 | HR 67 | Ht 64.0 in | Wt 174.0 lb

## 2015-06-10 DIAGNOSIS — D510 Vitamin B12 deficiency anemia due to intrinsic factor deficiency: Secondary | ICD-10-CM | POA: Diagnosis not present

## 2015-06-10 DIAGNOSIS — E039 Hypothyroidism, unspecified: Secondary | ICD-10-CM

## 2015-06-10 DIAGNOSIS — Z6829 Body mass index (BMI) 29.0-29.9, adult: Secondary | ICD-10-CM | POA: Diagnosis not present

## 2015-06-10 DIAGNOSIS — G894 Chronic pain syndrome: Secondary | ICD-10-CM | POA: Diagnosis not present

## 2015-06-10 DIAGNOSIS — Z1389 Encounter for screening for other disorder: Secondary | ICD-10-CM | POA: Diagnosis not present

## 2015-06-10 MED ORDER — LEVOTHYROXINE SODIUM 100 MCG PO TABS: 100.0000 ug | ORAL_TABLET | Freq: Every day | ORAL | Status: DC

## 2015-06-10 NOTE — Progress Notes (Signed)
HPI  [name] is a 45 y.o.-year-old female, here to f/u for hypothyroidism. She is on LT4 112 mcg po qam. She is compliant and taking her LT4 properly. She  has no new complaints. She has steady weight  She denies heat intolerance.   ROS: Constitutional: no weight gain/loss, no fatigue, no subjective hyperthermia/hypothermia Eyes: no blurry vision, no xerophthalmia ENT: no sore throat, no nodules palpated in throat, no dysphagia/odynophagia, no hoarseness Cardiovascular: no CP/SOB/palpitations/leg swelling Respiratory: no cough/SOB Gastrointestinal: no N/V/D/C Musculoskeletal: no muscle/joint aches Skin: no rashes Neurological: no tremors/numbness/tingling/dizziness Psychiatric: no depression/anxiety  PE: BP 115/79 mmHg  Pulse 67  Ht 5\' 4"  (1.626 m)  Wt 174 lb (78.926 kg)  BMI 29.85 kg/m2  SpO2 97% Wt Readings from Last 3 Encounters:  06/10/15 174 lb (78.926 kg)  05/04/15 178 lb 6.4 oz (80.922 kg)  04/20/15 179 lb 3.2 oz (81.285 kg)   Constitutional: overweight, in NAD Eyes: PERRLA, EOMI, no exophthalmos ENT: moist mucous membranes, no thyromegaly, no cervical lymphadenopathy Cardiovascular: RRR, No MRG Respiratory: CTA B Gastrointestinal: abdomen soft, NT, ND, BS+ Musculoskeletal: no deformities, strength intact in all 4 Skin: moist, warm, no rashes Neurological: no tremor with outstretched hands, DTR normal in all 4  Recent Results (from the past 2160 hour(s))  POCT INR     Status: Abnormal   Collection Time: 03/23/15 11:21 AM  Result Value Ref Range   INR 1.5   POCT INR     Status: Normal   Collection Time: 03/30/15 10:53 AM  Result Value Ref Range   INR 2.3   POCT INR     Status: Abnormal   Collection Time: 04/13/15 10:50 AM  Result Value Ref Range   INR 1.9   Hepatitis panel, acute     Status: None   Collection Time: 04/22/15 12:30 PM  Result Value Ref Range   Hepatitis B Surface Ag Negative Negative   HCV Ab 0.1 0.0 - 0.9 s/co ratio    Comment:  (NOTE)                                  Negative:     < 0.8                             Indeterminate: 0.8 - 0.9                                  Positive:     > 0.9 The CDC recommends that a positive HCV antibody result be followed up with a HCV Nucleic Acid Amplification test [id number]). Performed At: [facility] [place], [place] [id number] Lindon Romp MD [id number]    Hep A IgM Negative Negative   Hep B C IgM Negative Negative  Quantiferon tb gold assay (blood)     Status: None   Collection Time: 04/22/15 12:30 PM  Result Value Ref Range   QUANTIFERON INCUBATION Comment     Comment: (NOTE) Specimen incubated at [place], [place], [place]. Performed At: [facility] [address] [place], [place] [id number] Lindon Romp MD [id number]   [place] In Tube     Status: None   Collection Time: 04/22/15 12:30 PM  Result Value Ref Range   QUANTIFERON TB GOLD Negative Negative   QUANTIFERON CRITERIA Comment  Comment: (NOTE) To be considered positive a specimen should have a TB Ag minus Nil value greater than or equal to 0.35 IU/mL and in addition the TB Ag minus Nil value must be greater than or equal to 25% of the Nil value. There may be insufficient information in these values to differentiate between some negative and some indeterminate test values.    QUANTIFERON TB AG VALUE 0.03 IU/mL   Quantiferon Nil Value 0.04 IU/mL   QUANTIFERON MITOGEN VALUE >10.00 IU/mL   QFT TB AG MINUS NIL VALUE <0.00 IU/mL   Interpretation: Comment     Comment: (NOTE) The QuantiFERON TB Gold (in Tube) assay is intended for use as an aid in the diagnosis of TB infection. Negative results suggest that there is no TB infection. In patients with high suspicion of exposure, a negative test should be repeated. A positive test indicates infection with Mycobacterium tuberculosis. Among individuals without tuberculosis infection, a positive test may be due  to exposure to [place], M. szulgai or M. marinum. On the Internet, go to https://figueroa-lambert.info/ for further details. Performed At: [facility] [place], [place] [id number] Lindon Romp MD [id number]   POCT INR     Status: None   Collection Time: 04/25/15 12:51 PM  Result Value Ref Range   INR 3.0   POCT INR     Status: Normal   Collection Time: 05/09/15 11:44 AM  Result Value Ref Range   INR 2.6   POCT INR     Status: Normal   Collection Time: 05/30/15 11:30 AM  Result Value Ref Range   INR 3.4   TSH     Status: Abnormal   Collection Time: 06/03/15 11:58 AM  Result Value Ref Range   TSH 0.13 (L) mIU/L    Comment:   Reference Range   > or = 20 Years  0.40-4.50   Pregnancy Range First trimester  0.26-2.66 Second trimester 0.55-2.73 Third trimester  0.43-2.91     T4, free     Status: None   Collection Time: 06/03/15 11:58 AM  Result Value Ref Range   Free T4 1.6 0.8 - 1.8 ng/dL    ASSESSMENT: 1. Hypothyroidism  PLAN:    -Patient with long-standing hypothyroidism, on levothyroxine therapy.  -Heart thyroid function tests are suggestive of a slight over replacement.  - I approached her for a lower dose of levothyroxine. She agrees to lower to 100 g by mouth every morning.   - We discussed about correct intake of levothyroxine, at fasting, with water, separated by at least 30 minutes from breakfast, and separated by more than 4 hours from calcium, iron, multivitamins, acid reflux medications (PPIs). -Patient is made aware of the fact that thyroid hormone replacement is needed for life, dose to be adjusted by periodic monitoring of thyroid function tests. - Will check thyroid tests before next visit: TSH, free T4   Glade Lloyd, MD Phone: [phone]  Fax: [phone]   06/10/2015, 1:46 PM

## 2015-06-13 ENCOUNTER — Other Ambulatory Visit: Payer: Self-pay | Admitting: Gastroenterology

## 2015-06-16 ENCOUNTER — Encounter: Payer: Self-pay | Admitting: Gastroenterology

## 2015-06-16 ENCOUNTER — Ambulatory Visit (INDEPENDENT_AMBULATORY_CARE_PROVIDER_SITE_OTHER): Payer: BLUE CROSS/BLUE SHIELD | Admitting: Gastroenterology

## 2015-06-16 VITALS — BP 119/80 | HR 69 | Temp 97.4°F | Ht 64.0 in | Wt 174.6 lb

## 2015-06-16 DIAGNOSIS — K3184 Gastroparesis: Secondary | ICD-10-CM

## 2015-06-16 MED ORDER — DEXLANSOPRAZOLE 60 MG PO CPDR: 1.0000 | DELAYED_RELEASE_CAPSULE | Freq: Every day | ORAL | Status: DC

## 2015-06-16 NOTE — Progress Notes (Signed)
CC'ED TO PCP 

## 2015-06-16 NOTE — Assessment & Plan Note (Signed)
Doing well on Reglan 5 mg po BID, Zofran prn, and Dexilant. Continue current regimen and return in 6 months.

## 2015-06-16 NOTE — Patient Instructions (Signed)
I sent in a 90 day supply of Dexilant with refills to your pharmacy.  We will see you in 6 months!   You can email at [email or web address] to let me know how your daughter does! So excited for all of you!

## 2015-06-16 NOTE — Progress Notes (Signed)
Primary Care Physician:  Purvis Kilts, MD Primary GI: Dr. Gala Romney   Chief Complaint  Patient presents with  . Follow-up    HPI:   [name] is a 45 y.o. female presenting today with a history of erosive esophagitis and gastroparesis. Has done well on low dose Reglan BID, PPI. Zofran now covered by insurance and keeps on hand if needed. Just got over the flu and had weight loss secondary to this. Mild increase in nausea since the flu but no vomiting. Overall, feels good. Going on a cruise in July. No other concerning GI issues.   Past Medical History  Diagnosis Date  . SLE (systemic lupus erythematosus) ([place])   . BRCA2 negative   . BRCA1 negative   . 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 B12 deficiency   . Mitral valve disease, rheumatic     St. Jude prosthesis  . Raynaud disease   . Fibromyalgia   . Anxiety   . Peripheral neuropathy ([place])   . Anticoagulation goal of INR 2.5 to 3.5   . Breast cancer ([place]) 2011    Left side  . Drug-induced hepatitis     States per her rheumatologist, transaminases elevated but normalized after drug removed for   . Bursitis of right knee     Septic bursitis    Past Surgical History  Procedure Laterality Date  . Mitral valve replacement    . Cholecystectomy    . Mastectomy    . Breast surgery    . Endometrial ablation  2008    She no longer has menses  . Insertion expander left breast  11/16/10  . Tissue expander placement  02/14/2011    Procedure: TISSUE EXPANDER;  Surgeon: Macon Large;  Location: [facility];  Service: Plastics;  Laterality: Bilateral;  Left Breast Remove Tissue Expander Placement of Implant Breast Reconstruction  . Mastopexy  02/14/2011    Procedure: MASTOPEXY;  Surgeon: Macon Large;  Location: [facility];  Service: Plastics;  Laterality: Bilateral;  Right Breast Reduction   . Esophagogastroduodenoscopy N/A 03/25/2013    Dr. Raliegh Scarlet reflux esophagitis-likely source of  patient's symptoms (patulous EG junction). Hiatal hernia otherwise normal  . Tubal ligation      Current Outpatient Prescriptions  Medication Sig Dispense Refill  . Albuterol (VENTOLIN IN) Inhale into the lungs as needed.    . ALPRAZolam (XANAX) 0.5 MG tablet Take 0.5 mg by mouth at bedtime.     . cyanocobalamin (,VITAMIN B-12,) 1000 MCG/ML injection Inject 1 mL (1,000 mcg total) into the muscle every 30 (thirty) days. 10 mL prn  . dexlansoprazole (DEXILANT) 60 MG capsule Take 1 capsule (60 mg total) by mouth daily. 90 capsule 3  . furosemide (LASIX) 40 MG tablet TAKE ONE TABLET BY MOUTH ONCE DAILY. 90 tablet 1  . gabapentin (NEURONTIN) 600 MG tablet Take 600 mg by mouth 2 (two) times daily.     . hydroxychloroquine (PLAQUENIL) 200 MG tablet Take 200 mg by mouth 2 (two) times daily.      Marland Kitchen ibuprofen (ADVIL,MOTRIN) 200 MG tablet Take 800 mg by mouth every 6 (six) hours as needed for headache.    . levothyroxine (SYNTHROID, LEVOTHROID) 100 MCG tablet Take 1 tablet (100 mcg total) by mouth daily. 90 tablet 1  . metoCLOPramide (REGLAN) 5 MG/5ML solution Take 10 mLs (10 mg total) by mouth 4 (four) times daily -  before meals and at bedtime. 240 mL 3  .  ondansetron (ZOFRAN) 4 MG tablet Take 1 tablet (4 mg total) by mouth every 8 (eight) hours as needed for nausea or vomiting. 60 tablet 3  . oxyCODONE-acetaminophen (PERCOCET) 7.5-325 MG per tablet Take 1 tablet by mouth every 4 (four) hours as needed for pain.    . potassium chloride (KLOR-CON 10) 10 MEQ tablet Take one tablet daily as directed    . promethazine (PHENERGAN) 25 MG tablet Take 1 tablet (25 mg total) by mouth every 6 (six) hours as needed for nausea or vomiting. 30 tablet 3  . riTUXimab (RITUXAN) 100 MG/10ML injection Inject into the vein. Every 6 months    . rOPINIRole (REQUIP) 0.5 MG tablet Take 1 mg by mouth at bedtime.     Marland Kitchen warfarin (COUMADIN) 5 MG tablet Take 2 tablets daily except 1 1/2 tablets on Tuesdays and Saturdays 60 tablet  4  . zolpidem (AMBIEN) 10 MG tablet Take 10 mg by mouth at bedtime as needed for sleep.      No current facility-administered medications for this visit.    Allergies as of 06/16/2015  . (No Known Allergies)    Family History  Problem Relation Age of Onset  . Colon cancer Neg Hx   . Hypertension Mother   . Hyperlipidemia Mother   . Hypertension Father     Social History   Social History  . Marital Status: Married    Spouse Name: N/A  . Number of Children: 2  . Years of Education: N/A   Social History Main Topics  . Smoking status: Former Smoker -- 1.00 packs/day for 15 years    Types: Cigarettes    Quit date: 02/19/2006  . Smokeless tobacco: Never Used     Comment: Quit x 10 years  . Alcohol Use: 0.0 oz/week    0 Standard drinks or equivalent per week     Comment: Occasional  . Drug Use: No  . Sexual Activity: Yes    Birth Control/ Protection: Surgical     Comment: tubal   Other Topics Concern  . None   Social History Narrative    Review of Systems: Negative unless mentioned in HPI   Physical Exam: BP 119/80 mmHg  Pulse 69  Temp(Src) 97.4 F (36.3 C)  Ht 5' 4"  (1.626 m)  Wt 174 lb 9.6 oz (79.198 kg)  BMI 29.96 kg/m2 General:   Alert and oriented. No distress noted. Pleasant and cooperative.  Head:  Normocephalic and atraumatic. Eyes:  Conjuctiva clear without scleral icterus. Abdomen:  +BS, soft, non-tender and non-distended. No rebound or guarding. No HSM or masses noted. Msk:  Symmetrical without gross deformities. Normal posture. Extremities:  Without edema. Neurologic:  Alert and  oriented x4;  grossly normal neurologically. Psych:  Alert and cooperative. Normal mood and affect.

## 2015-06-27 ENCOUNTER — Encounter: Payer: Self-pay | Admitting: Cardiology

## 2015-06-27 ENCOUNTER — Ambulatory Visit (INDEPENDENT_AMBULATORY_CARE_PROVIDER_SITE_OTHER): Payer: BLUE CROSS/BLUE SHIELD | Admitting: Cardiology

## 2015-06-27 ENCOUNTER — Ambulatory Visit (INDEPENDENT_AMBULATORY_CARE_PROVIDER_SITE_OTHER): Payer: BLUE CROSS/BLUE SHIELD | Admitting: *Deleted

## 2015-06-27 ENCOUNTER — Other Ambulatory Visit: Payer: Self-pay | Admitting: Gastroenterology

## 2015-06-27 VITALS — BP 104/70 | HR 71 | Ht 64.0 in | Wt 182.0 lb

## 2015-06-27 DIAGNOSIS — I059 Rheumatic mitral valve disease, unspecified: Secondary | ICD-10-CM

## 2015-06-27 DIAGNOSIS — Z954 Presence of other heart-valve replacement: Secondary | ICD-10-CM

## 2015-06-27 DIAGNOSIS — Z952 Presence of prosthetic heart valve: Secondary | ICD-10-CM

## 2015-06-27 DIAGNOSIS — Z9221 Personal history of antineoplastic chemotherapy: Secondary | ICD-10-CM

## 2015-06-27 DIAGNOSIS — Z7901 Long term (current) use of anticoagulants: Secondary | ICD-10-CM | POA: Diagnosis not present

## 2015-06-27 DIAGNOSIS — Z5181 Encounter for therapeutic drug level monitoring: Secondary | ICD-10-CM

## 2015-06-27 LAB — POCT INR: INR: 4

## 2015-06-27 NOTE — Patient Instructions (Signed)
Your physician wants you to follow-up in: 6 months You will receive a reminder letter in the mail two months in advance. If you don't receive a letter, please call our office to schedule the follow-up appointment.     Your physician has requested that you have an echocardiogram JUST BEFORE FOLLOW UP IN 6 MONTHS. Echocardiography is a painless test that uses sound waves to create images of your heart. It provides your doctor with information about the size and shape of your heart and how well your heart's chambers and valves are working. This procedure takes approximately one hour. There are no restrictions for this procedure.      Your physician recommends that you continue on your current medications as directed. Please refer to the Current Medication list given to you today.     Thank you for choosing [facility] !

## 2015-06-27 NOTE — Progress Notes (Signed)
Cardiology Office Note  Date: 06/27/2015   ID: [name], DOB Sep 25, 1970, MRN [id number]  PCP: Purvis Kilts, MD  Primary Cardiologist: Rozann Lesches, MD   Chief Complaint  Patient presents with  . Mitral valve disease    History of Present Illness: [name] is a 45 y.o. female last seen in November 2016. She presents for a routine follow-up visit. She does not report any significant palpitations or chest pain. She has stable NYHA class I dyspnea. Has been doing some walking for exercise.  She had a follow-up echocardiogram done in November of last year. She is on Rituxan per rheumatology in the setting of arthritis and SLE. Gets treatments on a six-month basis.  She continues on Coumadin with follow-up in the anticoagulation clinic. No bleeding reported.  Past Medical History  Diagnosis Date  . SLE (systemic lupus erythematosus) ([place])   . BRCA2 negative   . BRCA1 negative   . 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 B12 deficiency   . Mitral valve disease, rheumatic     St. Jude prosthesis  . Raynaud disease   . Fibromyalgia   . Anxiety   . Peripheral neuropathy ([place])   . Anticoagulation goal of INR 2.5 to 3.5   . Breast cancer ([place]) 2011    Left side  . Drug-induced hepatitis     States per her rheumatologist, transaminases elevated but normalized after drug removed for   . Bursitis of right knee     Septic bursitis    Past Surgical History  Procedure Laterality Date  . Mitral valve replacement    . Cholecystectomy    . Mastectomy    . Breast surgery    . Endometrial ablation  2008    She no longer has menses  . Insertion expander left breast  11/16/10  . Tissue expander placement  02/14/2011    Procedure: TISSUE EXPANDER;  Surgeon: Macon Large;  Location: [facility];  Service: Plastics;  Laterality: Bilateral;  Left Breast Remove Tissue Expander Placement of Implant Breast Reconstruction  . Mastopexy   02/14/2011    Procedure: MASTOPEXY;  Surgeon: Macon Large;  Location: [facility];  Service: Plastics;  Laterality: Bilateral;  Right Breast Reduction   . Esophagogastroduodenoscopy N/A 03/25/2013    Dr. Raliegh Scarlet reflux esophagitis-likely source of patient's symptoms (patulous EG junction). Hiatal hernia otherwise normal  . Tubal ligation      Current Outpatient Prescriptions  Medication Sig Dispense Refill  . Albuterol (VENTOLIN IN) Inhale into the lungs as needed.    . ALPRAZolam (XANAX) 0.5 MG tablet Take 0.5 mg by mouth at bedtime.     . cyanocobalamin (,VITAMIN B-12,) 1000 MCG/ML injection Inject 1 mL (1,000 mcg total) into the muscle every 30 (thirty) days. 10 mL prn  . dexlansoprazole (DEXILANT) 60 MG capsule Take 1 capsule (60 mg total) by mouth daily. 90 capsule 3  . furosemide (LASIX) 40 MG tablet TAKE ONE TABLET BY MOUTH ONCE DAILY. 90 tablet 1  . gabapentin (NEURONTIN) 600 MG tablet Take 600 mg by mouth 2 (two) times daily.     . hydroxychloroquine (PLAQUENIL) 200 MG tablet Take 200 mg by mouth 2 (two) times daily.      Marland Kitchen ibuprofen (ADVIL,MOTRIN) 200 MG tablet Take 800 mg by mouth every 6 (six) hours as needed for headache.    . levothyroxine (SYNTHROID, LEVOTHROID) 100 MCG tablet Take 1 tablet (100 mcg total) by mouth daily. [place]  tablet 1  . metoCLOPramide (REGLAN) 5 MG/5ML solution Take 10 mLs (10 mg total) by mouth 4 (four) times daily -  before meals and at bedtime. 240 mL 3  . ondansetron (ZOFRAN) 4 MG tablet Take 1 tablet (4 mg total) by mouth every 8 (eight) hours as needed for nausea or vomiting. 60 tablet 3  . oxyCODONE-acetaminophen (PERCOCET) 7.5-325 MG per tablet Take 1 tablet by mouth every 4 (four) hours as needed for pain.    . potassium chloride (KLOR-CON 10) 10 MEQ tablet Take one tablet daily as directed    . promethazine (PHENERGAN) 25 MG tablet Take 1 tablet (25 mg total) by mouth every 6 (six) hours as needed for nausea or vomiting. 30 tablet 3  . riTUXimab  (RITUXAN) 100 MG/10ML injection Inject into the vein. Every 6 months    . rOPINIRole (REQUIP) 0.5 MG tablet Take 1 mg by mouth at bedtime.     Marland Kitchen warfarin (COUMADIN) 5 MG tablet Take 2 tablets daily except 1 1/2 tablets on Tuesdays and Saturdays 60 tablet 4  . zolpidem (AMBIEN) 10 MG tablet Take 10 mg by mouth at bedtime as needed for sleep.      No current facility-administered medications for this visit.   Allergies:  Review of patient's allergies indicates no known allergies.   Social History: The patient  reports that she quit smoking about 9 years ago. Her smoking use included Cigarettes. She has a 15 pack-year smoking history. She has never used smokeless tobacco. She reports that she drinks alcohol. She reports that she does not use illicit drugs.   ROS:  Please see the history of present illness. Otherwise, complete review of systems is positive for stable arthritis symptoms.  All other systems are reviewed and negative.   Physical Exam: VS:  BP 104/70 mmHg  Pulse 71  Ht _0  (1.626 m)  Wt 182 lb (82.555 kg)  BMI 31.22 kg/m2  SpO2 99%, BMI Body mass index is 31.22 kg/(m^2).  Wt Readings from Last 3 Encounters:  06/27/15 182 lb (82.555 kg)  06/16/15 174 lb 9.6 oz (79.198 kg)  06/10/15 174 lb (78.926 kg)    Appears comfortable at rest.  HEENT: Conjunctiva and lids normal, oropharynx clear.  Neck: Supple, no elevated JVP or carotid bruits, no thyromegaly.  Lungs: Clear to auscultation, nonlabored breathing at rest.  Cardiac: Regular rate and rhythm, crisp prosthetic sound in S1, no S3 or significant systolic murmur, no pericardial rub.  Abdomen: Soft, nontender, bowel sounds present, no guarding or rebound.  Extremities: No pitting edema, distal pulses 2+.   ECG: I personally reviewed the prior tracing from 12/26/2014 which showed sinus rhythm with incomplete right breast block and nonspecific T-wave changes.  Recent Labwork: 06/03/2015: TSH 0.13*   Other Studies  Reviewed Today:  Echocardiogram 12/27/2014: Study Conclusions  - Left ventricle: The cavity size was normal. Wall thickness was  normal. Systolic function was normal. The estimated ejection  fraction was in the range of 60% to 65%. Cannot assess diastolic  function in setting of mechanical MV. - Aortic valve: Valve area (VTI): 1.95 cm^2. Valve area (Vmax):  1.79 cm^2. - Mitral valve: There is a 27-mm St. Jude mechanical valve in the  MV position.  There was no evidence for stenosis. There was no significant  regurgitation. Mean gradient (D): 3 mm Hg. Peak gradient (D): 7  mm Hg. - Left atrium: The atrium was moderately dilated. - Technically adequate study.  Assessment and Plan:  1.  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 She continues on Coumadin with follow-up in the anticoagulation clinic.  2. Patient on Rituxan per rheumatology for management of arthritis with SLE. We will obtain a follow-up echocardiogram for her next visit to assure stability in LVEF.  Current medicines were reviewed with the patient today.   Orders Placed This Encounter  Procedures  . Echocardiogram    Disposition: FU with me in 6 months.   Signed, Satira Sark, MD, [facility] 06/27/2015 1:10 PM    [facility] [address], [place], [place] [address] Phone: [phone]; Fax: [phone]

## 2015-06-30 DIAGNOSIS — Z79899 Other long term (current) drug therapy: Secondary | ICD-10-CM | POA: Diagnosis not present

## 2015-06-30 DIAGNOSIS — M329 Systemic lupus erythematosus, unspecified: Secondary | ICD-10-CM | POA: Diagnosis not present

## 2015-06-30 DIAGNOSIS — M2550 Pain in unspecified joint: Secondary | ICD-10-CM | POA: Diagnosis not present

## 2015-06-30 DIAGNOSIS — M25561 Pain in right knee: Secondary | ICD-10-CM | POA: Diagnosis not present

## 2015-06-30 DIAGNOSIS — M0609 Rheumatoid arthritis without rheumatoid factor, multiple sites: Secondary | ICD-10-CM | POA: Diagnosis not present

## 2015-07-04 DIAGNOSIS — E538 Deficiency of other specified B group vitamins: Secondary | ICD-10-CM | POA: Diagnosis not present

## 2015-07-04 DIAGNOSIS — G894 Chronic pain syndrome: Secondary | ICD-10-CM | POA: Diagnosis not present

## 2015-07-04 DIAGNOSIS — Z6831 Body mass index (BMI) 31.0-31.9, adult: Secondary | ICD-10-CM | POA: Diagnosis not present

## 2015-07-04 DIAGNOSIS — Z1389 Encounter for screening for other disorder: Secondary | ICD-10-CM | POA: Diagnosis not present

## 2015-07-06 ENCOUNTER — Other Ambulatory Visit: Payer: Self-pay | Admitting: Rheumatology

## 2015-07-06 DIAGNOSIS — M25561 Pain in right knee: Secondary | ICD-10-CM

## 2015-07-13 ENCOUNTER — Ambulatory Visit (INDEPENDENT_AMBULATORY_CARE_PROVIDER_SITE_OTHER): Payer: BLUE CROSS/BLUE SHIELD | Admitting: *Deleted

## 2015-07-13 DIAGNOSIS — Z5181 Encounter for therapeutic drug level monitoring: Secondary | ICD-10-CM

## 2015-07-13 DIAGNOSIS — M79671 Pain in right foot: Secondary | ICD-10-CM | POA: Diagnosis not present

## 2015-07-13 DIAGNOSIS — Z7901 Long term (current) use of anticoagulants: Secondary | ICD-10-CM

## 2015-07-13 DIAGNOSIS — B351 Tinea unguium: Secondary | ICD-10-CM | POA: Diagnosis not present

## 2015-07-13 DIAGNOSIS — I059 Rheumatic mitral valve disease, unspecified: Secondary | ICD-10-CM | POA: Diagnosis not present

## 2015-07-13 DIAGNOSIS — Z952 Presence of prosthetic heart valve: Secondary | ICD-10-CM

## 2015-07-13 DIAGNOSIS — Z954 Presence of other heart-valve replacement: Secondary | ICD-10-CM

## 2015-07-13 LAB — POCT INR: INR: 4.1

## 2015-07-14 ENCOUNTER — Ambulatory Visit
Admission: RE | Admit: 2015-07-14 | Discharge: 2015-07-14 | Disposition: A | Discharge status: Home / Self Care | Payer: BLUE CROSS/BLUE SHIELD | Source: Ambulatory Visit | Attending: Rheumatology | Admitting: Rheumatology

## 2015-07-14 DIAGNOSIS — M25561 Pain in right knee: Secondary | ICD-10-CM

## 2015-07-14 DIAGNOSIS — M25461 Effusion, right knee: Secondary | ICD-10-CM | POA: Diagnosis not present

## 2015-07-14 DIAGNOSIS — M179 Osteoarthritis of knee, unspecified: Secondary | ICD-10-CM | POA: Diagnosis not present

## 2015-07-21 DIAGNOSIS — C50912 Malignant neoplasm of unspecified site of left female breast: Secondary | ICD-10-CM | POA: Diagnosis not present

## 2015-08-01 ENCOUNTER — Ambulatory Visit (INDEPENDENT_AMBULATORY_CARE_PROVIDER_SITE_OTHER): Payer: BLUE CROSS/BLUE SHIELD | Admitting: *Deleted

## 2015-08-01 DIAGNOSIS — I059 Rheumatic mitral valve disease, unspecified: Secondary | ICD-10-CM

## 2015-08-01 DIAGNOSIS — Z7901 Long term (current) use of anticoagulants: Secondary | ICD-10-CM | POA: Diagnosis not present

## 2015-08-01 DIAGNOSIS — Z5181 Encounter for therapeutic drug level monitoring: Secondary | ICD-10-CM

## 2015-08-01 DIAGNOSIS — Z954 Presence of other heart-valve replacement: Secondary | ICD-10-CM | POA: Diagnosis not present

## 2015-08-01 DIAGNOSIS — Z952 Presence of prosthetic heart valve: Secondary | ICD-10-CM

## 2015-08-01 LAB — POCT INR: INR: 3.6

## 2015-08-08 DIAGNOSIS — R05 Cough: Secondary | ICD-10-CM | POA: Diagnosis not present

## 2015-08-08 DIAGNOSIS — M329 Systemic lupus erythematosus, unspecified: Secondary | ICD-10-CM | POA: Diagnosis not present

## 2015-08-08 DIAGNOSIS — G609 Hereditary and idiopathic neuropathy, unspecified: Secondary | ICD-10-CM | POA: Diagnosis not present

## 2015-08-09 ENCOUNTER — Ambulatory Visit (HOSPITAL_COMMUNITY)
Admission: RE | Admit: 2015-08-09 | Discharge: 2015-08-09 | Disposition: A | Discharge status: Home / Self Care | Payer: BLUE CROSS/BLUE SHIELD | Source: Ambulatory Visit | Attending: Pulmonary Disease | Admitting: Pulmonary Disease

## 2015-08-09 ENCOUNTER — Other Ambulatory Visit (HOSPITAL_COMMUNITY): Payer: Self-pay | Admitting: Pulmonary Disease

## 2015-08-09 DIAGNOSIS — R059 Cough, unspecified: Secondary | ICD-10-CM

## 2015-08-09 DIAGNOSIS — R05 Cough: Secondary | ICD-10-CM | POA: Diagnosis not present

## 2015-08-10 DIAGNOSIS — G894 Chronic pain syndrome: Secondary | ICD-10-CM | POA: Diagnosis not present

## 2015-08-10 DIAGNOSIS — Z6830 Body mass index (BMI) 30.0-30.9, adult: Secondary | ICD-10-CM | POA: Diagnosis not present

## 2015-08-10 DIAGNOSIS — J209 Acute bronchitis, unspecified: Secondary | ICD-10-CM | POA: Diagnosis not present

## 2015-08-10 DIAGNOSIS — E538 Deficiency of other specified B group vitamins: Secondary | ICD-10-CM | POA: Diagnosis not present

## 2015-08-10 DIAGNOSIS — E6609 Other obesity due to excess calories: Secondary | ICD-10-CM | POA: Diagnosis not present

## 2015-08-12 ENCOUNTER — Ambulatory Visit (INDEPENDENT_AMBULATORY_CARE_PROVIDER_SITE_OTHER): Payer: BLUE CROSS/BLUE SHIELD | Admitting: Adult Health

## 2015-08-12 ENCOUNTER — Other Ambulatory Visit (HOSPITAL_COMMUNITY)
Admission: RE | Admit: 2015-08-12 | Discharge: 2015-08-12 | Disposition: A | Discharge status: Home / Self Care | Payer: BLUE CROSS/BLUE SHIELD | Source: Ambulatory Visit | Attending: Adult Health | Admitting: Adult Health

## 2015-08-12 ENCOUNTER — Encounter: Payer: Self-pay | Admitting: Adult Health

## 2015-08-12 VITALS — BP 112/78 | HR 66 | Ht 63.5 in | Wt 176.0 lb

## 2015-08-12 DIAGNOSIS — Z124 Encounter for screening for malignant neoplasm of cervix: Secondary | ICD-10-CM

## 2015-08-12 DIAGNOSIS — Z01419 Encounter for gynecological examination (general) (routine) without abnormal findings: Secondary | ICD-10-CM | POA: Diagnosis not present

## 2015-08-12 DIAGNOSIS — Z8742 Personal history of other diseases of the female genital tract: Secondary | ICD-10-CM

## 2015-08-12 DIAGNOSIS — Z1151 Encounter for screening for human papillomavirus (HPV): Secondary | ICD-10-CM | POA: Insufficient documentation

## 2015-08-12 DIAGNOSIS — Z1212 Encounter for screening for malignant neoplasm of rectum: Secondary | ICD-10-CM | POA: Diagnosis not present

## 2015-08-12 DIAGNOSIS — Z01411 Encounter for gynecological examination (general) (routine) with abnormal findings: Secondary | ICD-10-CM | POA: Insufficient documentation

## 2015-08-12 DIAGNOSIS — R87610 Atypical squamous cells of undetermined significance on cytologic smear of cervix (ASC-US): Secondary | ICD-10-CM | POA: Diagnosis not present

## 2015-08-12 HISTORY — DX: Personal history of other diseases of the female genital tract: Z87.42

## 2015-08-12 LAB — HEMOCCULT GUIAC POC 1CARD (OFFICE): Fecal Occult Blood, POC: NEGATIVE

## 2015-08-12 MED ORDER — FLUCONAZOLE 150 MG PO TABS: ORAL_TABLET | ORAL | Status: DC

## 2015-08-12 NOTE — Patient Instructions (Signed)
Physical in 1 year, pap in 3 if normal Mammogram yearly Labs with PCP  

## 2015-08-12 NOTE — Progress Notes (Signed)
Patient ID: [name], female   DOB: 1970-12-04, 45 y.o.   MRN: [id number] History of Present Illness: [name] is a 45 year old white female in for a well woman gyn exam and pap, her last pap was 07/20/14 and showed ASCUS and +HPV.She is currently being treated for pneumonia by Dr Hilma Favors and see also sees Dr Luan Pulling. PCP is Dr Hilma Favors.  Current Medications, Allergies, Past Medical History, Past Surgical History, Family History and Social History were reviewed in [employer] record.     Review of Systems: Patient denies any headaches, hearing loss, fatigue, blurred vision,  chest pain, abdominal pain, problems with bowel movements, urination, or intercourse. No mood swings.+cough, has pneumonia  Has bursitis in right knee.   Physical Exam:BP 112/78 mmHg  Pulse 66  Ht 5' 3.5" (1.613 m)  Wt 176 lb (79.833 kg)  BMI 30.68 kg/m2 General:  Well developed, well nourished, no acute distress Skin:  Warm and dry Neck:  Midline trachea, normal thyroid, good ROM, no lymphadenopathy Lungs; wheezing to auscultation bilaterally Breast:  No dominant palpable mass, retraction, or nipple discharge on right, sp reduction and has implant on left sp mastectomy, has well healed scars  Cardiovascular: Regular rate and rhythm Abdomen:  Soft, non tender, no hepatosplenomegaly Pelvic:  External genitalia is normal in appearance, no lesions.  The vagina is normal in appearance. Urethra has no lesions or masses. The cervix is bulbous,Pap with HPV performed.  Uterus is felt to be normal size, shape, and contour.  No adnexal masses or tenderness noted.Bladder is non tender, no masses felt. Rectal: Good sphincter tone, no polyps, or hemorrhoids felt.  Hemoccult negative. Extremities/musculoskeletal:  No swelling or varicosities noted, no clubbing or  Toes are purple, has Raynauds Psych:  No mood changes, alert and cooperative,seems happy She requests diflucan, as she gets yeast with  antibiotics,she is on levaquin.  Impression: Well woman gyn exam and pap History of abnormal pap     Plan: Physical in 1 year, pap in 3 if normal Mammogram yearly Labs with PCP  Rx diflucan 150 mg # 2 take 1 now and 1 in days with 1 refill Follow up with Dr Hilma Favors if not getting better soon

## 2015-08-15 LAB — CYTOLOGY - PAP

## 2015-08-17 ENCOUNTER — Telehealth: Payer: Self-pay | Admitting: Adult Health

## 2015-08-17 ENCOUNTER — Encounter: Payer: Self-pay | Admitting: Adult Health

## 2015-08-17 DIAGNOSIS — R87618 Other abnormal cytological findings on specimens from cervix uteri: Secondary | ICD-10-CM

## 2015-08-17 DIAGNOSIS — R8789 Other abnormal findings in specimens from female genital organs: Secondary | ICD-10-CM | POA: Insufficient documentation

## 2015-08-17 HISTORY — DX: Other abnormal cytological findings on specimens from cervix uteri: R87.618

## 2015-08-17 NOTE — Telephone Encounter (Signed)
Pt aware pap abnormal will get colpo with Dr Elonda Husky

## 2015-08-22 ENCOUNTER — Ambulatory Visit (INDEPENDENT_AMBULATORY_CARE_PROVIDER_SITE_OTHER): Payer: BLUE CROSS/BLUE SHIELD | Admitting: *Deleted

## 2015-08-22 DIAGNOSIS — Z954 Presence of other heart-valve replacement: Secondary | ICD-10-CM | POA: Diagnosis not present

## 2015-08-22 DIAGNOSIS — Z5181 Encounter for therapeutic drug level monitoring: Secondary | ICD-10-CM

## 2015-08-22 DIAGNOSIS — Z952 Presence of prosthetic heart valve: Secondary | ICD-10-CM

## 2015-08-22 LAB — POCT INR: INR: 3

## 2015-08-30 ENCOUNTER — Ambulatory Visit (INDEPENDENT_AMBULATORY_CARE_PROVIDER_SITE_OTHER): Payer: BLUE CROSS/BLUE SHIELD | Admitting: Obstetrics & Gynecology

## 2015-08-30 ENCOUNTER — Encounter: Payer: Self-pay | Admitting: Obstetrics & Gynecology

## 2015-08-30 VITALS — BP 120/80 | HR 80 | Wt 175.0 lb

## 2015-08-30 DIAGNOSIS — IMO0002 Reserved for concepts with insufficient information to code with codable children: Secondary | ICD-10-CM

## 2015-08-30 DIAGNOSIS — R87610 Atypical squamous cells of undetermined significance on cytologic smear of cervix (ASC-US): Secondary | ICD-10-CM

## 2015-08-30 DIAGNOSIS — R87810 Cervical high risk human papillomavirus (HPV) DNA test positive: Secondary | ICD-10-CM | POA: Diagnosis not present

## 2015-08-30 NOTE — Progress Notes (Signed)
Patient ID: [name], female   DOB: 18-Feb-1971, 45 y.o.   MRN: [id number] Colposcopy Procedure Note:  Colposcopy Procedure Note  Indications: Pap smear 1 months ago showed: ASCUS with POSITIVE high risk HPV. The prior pap showed ASCUS with POSITIVE high risk HPV.  Prior cervical/vaginal disease: normal exam without visible pathology. Prior cervical treatment: no treatment.  Smoker:  No. New sexual partner:  No.  : time frame:    History of abnormal Pap: yes  Procedure Details  The risks and benefits of the procedure and Written informed consent obtained.  Speculum placed in vagina and excellent visualization of cervix achieved, cervix swabbed x 3 with acetic acid solution.  Findings: Cervix: no visible lesions, no mosaicism, no punctation and no abnormal vasculature; no biopsies taken. Vaginal inspection: normal without visible lesions. Vulvar colposcopy: vulvar colposcopy not performed.  Specimens: none  Complications: none.  Plan: Repeat Pap in 1 year

## 2015-09-02 DIAGNOSIS — M25561 Pain in right knee: Secondary | ICD-10-CM | POA: Diagnosis not present

## 2015-09-02 DIAGNOSIS — M2241 Chondromalacia patellae, right knee: Secondary | ICD-10-CM | POA: Diagnosis not present

## 2015-09-02 DIAGNOSIS — Z8739 Personal history of other diseases of the musculoskeletal system and connective tissue: Secondary | ICD-10-CM | POA: Diagnosis not present

## 2015-09-05 ENCOUNTER — Encounter (HOSPITAL_COMMUNITY): Admission: RE | Admit: 2015-09-05 | Payer: BLUE CROSS/BLUE SHIELD | Source: Ambulatory Visit

## 2015-09-05 DIAGNOSIS — Z9011 Acquired absence of right breast and nipple: Secondary | ICD-10-CM | POA: Diagnosis not present

## 2015-09-05 DIAGNOSIS — C50912 Malignant neoplasm of unspecified site of left female breast: Secondary | ICD-10-CM | POA: Diagnosis not present

## 2015-09-06 DIAGNOSIS — Z79899 Other long term (current) drug therapy: Secondary | ICD-10-CM | POA: Diagnosis not present

## 2015-09-08 DIAGNOSIS — J209 Acute bronchitis, unspecified: Secondary | ICD-10-CM | POA: Diagnosis not present

## 2015-09-08 DIAGNOSIS — Z6829 Body mass index (BMI) 29.0-29.9, adult: Secondary | ICD-10-CM | POA: Diagnosis not present

## 2015-09-08 DIAGNOSIS — G894 Chronic pain syndrome: Secondary | ICD-10-CM | POA: Diagnosis not present

## 2015-09-08 DIAGNOSIS — E538 Deficiency of other specified B group vitamins: Secondary | ICD-10-CM | POA: Diagnosis not present

## 2015-09-08 DIAGNOSIS — J069 Acute upper respiratory infection, unspecified: Secondary | ICD-10-CM | POA: Diagnosis not present

## 2015-09-12 ENCOUNTER — Ambulatory Visit (INDEPENDENT_AMBULATORY_CARE_PROVIDER_SITE_OTHER): Payer: BLUE CROSS/BLUE SHIELD | Admitting: *Deleted

## 2015-09-12 DIAGNOSIS — Z5181 Encounter for therapeutic drug level monitoring: Secondary | ICD-10-CM | POA: Diagnosis not present

## 2015-09-12 DIAGNOSIS — Z952 Presence of prosthetic heart valve: Secondary | ICD-10-CM

## 2015-09-12 DIAGNOSIS — Z954 Presence of other heart-valve replacement: Secondary | ICD-10-CM | POA: Diagnosis not present

## 2015-09-12 LAB — POCT INR: INR: 3.5

## 2015-09-13 ENCOUNTER — Ambulatory Visit (HOSPITAL_COMMUNITY)
Admission: RE | Admit: 2015-09-13 | Discharge: 2015-09-13 | Disposition: A | Discharge status: Home / Self Care | Payer: BLUE CROSS/BLUE SHIELD | Source: Ambulatory Visit | Attending: Adult Health | Admitting: Adult Health

## 2015-09-13 ENCOUNTER — Ambulatory Visit (INDEPENDENT_AMBULATORY_CARE_PROVIDER_SITE_OTHER): Payer: BLUE CROSS/BLUE SHIELD | Admitting: Adult Health

## 2015-09-13 ENCOUNTER — Encounter: Payer: Self-pay | Admitting: Adult Health

## 2015-09-13 VITALS — BP 104/62 | HR 70 | Resp 89 | Ht 64.0 in | Wt 171.0 lb

## 2015-09-13 DIAGNOSIS — R0602 Shortness of breath: Secondary | ICD-10-CM | POA: Diagnosis not present

## 2015-09-13 DIAGNOSIS — R0989 Other specified symptoms and signs involving the circulatory and respiratory systems: Secondary | ICD-10-CM | POA: Diagnosis not present

## 2015-09-13 DIAGNOSIS — R05 Cough: Secondary | ICD-10-CM

## 2015-09-13 DIAGNOSIS — I059 Rheumatic mitral valve disease, unspecified: Secondary | ICD-10-CM | POA: Diagnosis not present

## 2015-09-13 DIAGNOSIS — R059 Cough, unspecified: Secondary | ICD-10-CM

## 2015-09-13 DIAGNOSIS — I509 Heart failure, unspecified: Secondary | ICD-10-CM

## 2015-09-13 NOTE — Progress Notes (Signed)
Name: [name]    DOB: August 27, 1970  Age: 45 y.o.  MR#: [id number]       PCP:  Purvis Kilts, MD      Insurance: Payor: [employer] [place] / Plan: BCBS OTHER / Product Type: *No Product type* /   CC:   No chief complaint on file.   VS Vitals:   09/13/15 1433  Pulse: 70  Resp: (!) 89  Weight: 171 lb (77.6 kg)  Height: _0  (1.626 m)    Weights Current Weight  09/13/15 171 lb (77.6 kg)  08/30/15 175 lb (79.4 kg)  08/12/15 176 lb (79.8 kg)    Blood Pressure  BP Readings from Last 3 Encounters:  08/30/15 120/80  08/12/15 112/78  06/27/15 104/70     Admit date:  (Not on file) Last encounter with RMR:  Visit date not found   Allergy Review of patient's allergies indicates no known allergies.  Current Outpatient Prescriptions  Medication Sig Dispense Refill  . Albuterol (VENTOLIN IN) Inhale into the lungs as needed.    . ALPRAZolam (XANAX) 0.5 MG tablet Take 0.5 mg by mouth at bedtime.     . cyanocobalamin (,VITAMIN B-12,) 1000 MCG/ML injection Inject 1 mL (1,000 mcg total) into the muscle every 30 (thirty) days. 10 mL prn  . dexlansoprazole (DEXILANT) 60 MG capsule Take 1 capsule (60 mg total) by mouth daily. 90 capsule 3  . furosemide (LASIX) 40 MG tablet TAKE ONE TABLET BY MOUTH ONCE DAILY. 90 tablet 1  . gabapentin (NEURONTIN) 600 MG tablet Take 600 mg by mouth 2 (two) times daily.     . hydroxychloroquine (PLAQUENIL) 200 MG tablet Take 200 mg by mouth 2 (two) times daily.      Marland Kitchen ibuprofen (ADVIL,MOTRIN) 800 MG tablet Take 800 mg by mouth every 6 (six) hours as needed. Reported on 08/30/2015    . levothyroxine (SYNTHROID, LEVOTHROID) 100 MCG tablet Take 1 tablet (100 mcg total) by mouth daily. 90 tablet 1  . oxyCODONE-acetaminophen (PERCOCET) 7.5-325 MG per tablet Take 1 tablet by mouth every 4 (four) hours as needed for pain. Reported on 08/30/2015    . potassium chloride (KLOR-CON 10) 10 MEQ tablet Take one tablet daily as directed    . riTUXimab (RITUXAN) 100  MG/10ML injection Inject into the vein. Every 6 months    . rOPINIRole (REQUIP) 0.5 MG tablet Take 1 mg by mouth at bedtime.     Marland Kitchen warfarin (COUMADIN) 5 MG tablet Take 2 tablets daily except 1 1/2 tablets on Tuesdays and Saturdays 60 tablet 4  . zolpidem (AMBIEN) 10 MG tablet Take 10 mg by mouth at bedtime as needed for sleep.      No current facility-administered medications for this visit.     Discontinued Meds:   There are no discontinued medications.  Patient Active Problem List   Diagnosis Date Noted  . Abnormal Papanicolaou smear of cervix with positive human papilloma virus (HPV) test 08/17/2015  . History of abnormal cervical Pap smear 08/12/2015  . Primary hypothyroidism 12/07/2014  . Primary hypercoagulable state ([place]) 12/14/2013  . Encounter for monitoring cardiotoxic drug therapy 11/23/2013  . Gastroparesis 08/13/2013  . Encounter for therapeutic drug monitoring 03/18/2013  . Dyspepsia 03/12/2013  . Dyspnea 11/14/2012  . Anticoagulation goal of INR 2.5 to 3.5   . Peripheral neuropathy ([place])   . B12 deficiency 08/21/2010  . 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 BRCA1 negative 08/16/2010  . BRCA2  negative 08/16/2010  . Long term (current) use of anticoagulants 05/17/2010  . 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 History of mitral valve replacement with mechanical valve 11/10/2009  . Systemic lupus erythematosus ([facility]) 11/10/2009    LABS    Component Value Date/Time   NA 140 06/22/2013 1202   NA 135 (L) 06/07/2013 0657   NA 138 06/04/2013 0403   K 3.4 (L) 06/22/2013 1202   K 3.6 (L) 06/07/2013 0657   K 3.4 (L) 06/04/2013 0403   CL 103 06/22/2013 1202   CL 95 (L) 06/07/2013 0657   CL 99 06/04/2013 0403   CO2 27 06/22/2013 1202   CO2 31 06/07/2013 0657   CO2 25 06/04/2013 0403   GLUCOSE 82 06/22/2013 1202   GLUCOSE 87 06/07/2013 0657   GLUCOSE 100 (H) 06/04/2013 0403   BUN 11  06/22/2013 1202   BUN 11 06/07/2013 0657   BUN 10 06/04/2013 0403   CREATININE 0.77 06/22/2013 1202   CREATININE 0.82 06/07/2013 0657   CREATININE 0.74 06/04/2013 0403   CREATININE 0.88 11/14/2012 1615   CREATININE 0.59 11/22/2010 0910   CALCIUM 9.1 06/22/2013 1202   CALCIUM 9.1 06/07/2013 0657   CALCIUM 9.2 06/04/2013 0403   GFRNONAA >90 06/22/2013 1202   GFRNONAA 86 (L) 06/07/2013 0657   GFRNONAA >90 06/04/2013 0403   GFRAA >90 06/22/2013 1202   GFRAA >90 06/07/2013 0657   GFRAA >90 06/04/2013 0403   CMP     Component Value Date/Time   NA 140 06/22/2013 1202   K 3.4 (L) 06/22/2013 1202   CL 103 06/22/2013 1202   CO2 27 06/22/2013 1202   GLUCOSE 82 06/22/2013 1202   BUN 11 06/22/2013 1202   CREATININE 0.77 06/22/2013 1202   CREATININE 0.88 11/14/2012 1615   CALCIUM 9.1 06/22/2013 1202   PROT 7.2 06/22/2013 1202   ALBUMIN 3.5 06/22/2013 1202   AST 17 06/22/2013 1202   ALT 15 06/22/2013 1202   ALKPHOS 65 06/22/2013 1202   BILITOT 0.3 06/22/2013 1202   GFRNONAA >90 06/22/2013 1202   GFRAA >90 06/22/2013 1202       Component Value Date/Time   WBC 1.5 (L) 06/22/2013 1202   WBC 4.2 06/08/2013 0450   WBC 4.4 06/07/2013 0657   HGB 11.3 (L) 06/22/2013 1202   HGB 11.7 (L) 06/08/2013 0450   HGB 12.1 06/07/2013 0657   HCT 33.0 (L) 06/22/2013 1202   HCT 34.0 (L) 06/08/2013 0450   HCT 34.9 (L) 06/07/2013 0657   MCV 88.2 06/22/2013 1202   MCV 87.2 06/08/2013 0450   MCV 88.1 06/07/2013 0657    Lipid Panel     Component Value Date/Time   CHOL 229 (H) 09/18/2010 1156   TRIG 111 09/18/2010 1156   HDL 47 09/18/2010 1156   CHOLHDL 4.9 09/18/2010 1156   VLDL 22 09/18/2010 1156   LDLCALC 160 (H) 09/18/2010 1156    ABG No results found for: PHART, PCO2ART, PO2ART, HCO3, TCO2, ACIDBASEDEF, O2SAT   Lab Results  Component Value Date   TSH 0.13 (L) 06/03/2015   BNP (last 3 results) No results for input(s): BNP in the last 8760 hours.  ProBNP (last 3 results) No  results for input(s): PROBNP in the last 8760 hours.  Cardiac Panel (last 3 results) No results for input(s): CKTOTAL, CKMB, TROPONINI, RELINDX in the last 72 hours.  Iron/TIBC/Ferritin/ %Sat    Component Value Date/Time   IRON 51 06/24/2012 1046   TIBC  292 06/24/2012 1046   FERRITIN 154 12/22/2012 0955   IRONPCTSAT 17 (L) 06/24/2012 1046     EKG Orders placed or performed in visit on 12/22/14  . EKG 12-Lead     Prior Assessment and Plan Problem List as of 09/13/2015 Reviewed: 09/13/2015  2:35 PM by Drema Dallas, CMA     Digestive   B12 deficiency   Gastroparesis   Last Assessment & Plan 06/16/2015 Office Visit Written 06/16/2015 12:38 PM by Orvil Feil, NP    Doing well on Reglan 5 mg po BID, Zofran prn, and Dexilant. Continue current regimen and return in 6 months.         Endocrine   Primary hypothyroidism     Nervous and Auditory   Peripheral neuropathy [facility])   Last Assessment & Plan 06/18/2011 Office Visit Written 06/18/2011  2:54 PM by Ezra Sites, MD    Patient continues to have some pain in the lower extremities and she is already on high-dose gabapentin. I suggested that she could try tramadol 100 mg by mouth twice a day when necessary pain        Hematopoietic and Hemostatic   Primary hypercoagulable state ([place])     Other   Systemic lupus erythematosus [facility])   Last Assessment & Plan 11/14/2012 Office Visit Written 11/14/2012  4:07 PM by Lendon Colonel, NP    Having several somatic complaints include generalized weakness fatigue, neuralgia and dyspnea. She is due to followup with rheumatologist in one month.      Anticoagulation goal of INR 2.5 to 3.5   History of mitral valve replacement with mechanical valve   Last Assessment & Plan 11/23/2013 Office Visit Written 11/23/2013 10:19 AM by Satira Sark, MD    St. Jude prosthesis, stable by recent followup echocardiogram with normal gradients. Continue on Coumadin with followup in the Coumadin clinic.       Malignant neoplasm of upper outer quadrant of female breast [facility])   Last Assessment & Plan 05/12/2012 Office Visit Written 05/12/2012  2:39 PM by Satira Sark, MD    Status post chemotherapy and left mastectomy, in remission, followed by Dr. Tressie Stalker.      Long term (current) use of anticoagulants   Last Assessment & Plan 06/18/2011 Office Visit Written 06/18/2011  2:54 PM by Ezra Sites, MD    Patient continues to be followed in the Coumadin clinic. She reports no bleeding complications.      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 Question whether she was having any aspiration with acid reflux leading to shortness of breath.      Dyspepsia   Last Assessment & Plan 06/23/2013 Office Visit Written 06/23/2013 10:25 AM by Orvil Feil, NP    With EGD and CT on file, no evidence of gastritis or PUD. Improvement with Dexilant and weight loss noted. Still complains of persisting symptoms, bloating, and nausea. Will order GES to assess for delayed gastric emptying, although that would not help explain intermittent abdominal pain necessarily. May have non-ulcer dyspepsia, doubt SBBO. GES for now, with further recommendations to follow.       Encounter for therapeutic drug monitoring   Encounter for monitoring cardiotoxic drug therapy   Last Assessment & Plan 11/23/2013  Office Visit Written 11/23/2013 10:22 AM by Satira Sark, MD    The patient being treated with Rituxan, 2 episodes in the last 6 months. Recent followup echocardiogram showed stable LVEF of 50-55%.      History of abnormal cervical Pap smear   Abnormal Papanicolaou smear of cervix with positive human papilloma virus (HPV) test       Imaging: No results found.

## 2015-09-13 NOTE — Progress Notes (Signed)
Cardiology Office Note   Date:  09/13/2015   ID:  [name], DOB June 10, 1970, MRN [id number]  PCP:  Purvis Kilts, MD  Cardiologist: [place]/  Jory Sims, NP   No chief complaint on file.     History of Present Illness: [name] is a 45 y.o. female who presents for ongoing assessment and management of mitral valve disease, status post St. Jude prosthetic valve,on anticoagulation therapy with Coumadin followed in our [place] office,rheumatic heart disease. The patient was last seen by Dr. Domenic Polite on 06/27/2015. At that time the patient was clinically stable and was to followup in 6 months.  She states she has recently been treated for bronchitis, with a chest x-ray that was completed in June revealing mild congestive heart failure with mild pulmonary interstitial edema and tiny bilateral pleural effusions. Mild interstitial lung disease secondary to a process such as pneumonitis was not excluded.she was given a steroid shot placed on antibiotics and was seen by primary care Dr. Hilma Favors, and Dr. Luan Pulling.   She continues to have a cough, despite expectorants and cough suppressants. Primary care wanted [place] to followup with her to be sure that there were no ongoing issues with CHF. She denies fluid retention and weight gain dyspnea on exertion. She feels fine and tell she starts to cough.  Past Medical History:  Diagnosis Date  . Abnormal Papanicolaou smear of cervix with positive human papilloma virus (HPV) test 08/17/2015  . Anticoagulation goal of INR 2.5 to 3.5   . Anxiety   . B12 deficiency   . BRCA1 negative   . BRCA2 negative   . Breast cancer ([place]) 2011   Left side  . Bursitis of right knee    Septic bursitis  . Drug-induced hepatitis    States per her rheumatologist, transaminases elevated but normalized after drug removed for   . Fibromyalgia   . 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 History of abnormal cervical Pap  smear 08/12/2015  . Mitral valve disease, rheumatic    St. Jude prosthesis  . Peripheral neuropathy ([place])   . Pneumonia   . Raynaud disease   . Raynaud disease   . SLE (systemic lupus erythematosus) ([place])     Past Surgical History:  Procedure Laterality Date  . BREAST SURGERY    . CHOLECYSTECTOMY    . ENDOMETRIAL ABLATION  2008   She no longer has menses  . ESOPHAGOGASTRODUODENOSCOPY N/A 03/25/2013   Dr. Raliegh Scarlet reflux esophagitis-likely source of patient's symptoms (patulous EG junction). Hiatal hernia otherwise normal  . Insertion expander left breast  11/16/10  . MASTECTOMY    . MASTOPEXY  02/14/2011   Procedure: MASTOPEXY;  Surgeon: Macon Large;  Location: [place];  Service: Plastics;  Laterality: Bilateral;  Right Breast Reduction   . MITRAL VALVE REPLACEMENT    . TISSUE EXPANDER PLACEMENT  02/14/2011   Procedure: TISSUE EXPANDER;  Surgeon: Macon Large;  Location: [place];  Service: Plastics;  Laterality: Bilateral;  Left Breast Remove Tissue Expander Placement of Implant Breast Reconstruction  . TUBAL LIGATION       Current Outpatient Prescriptions  Medication Sig Dispense Refill  . Albuterol (VENTOLIN IN) Inhale into the lungs as needed.    . ALPRAZolam (XANAX) 0.5 MG tablet Take 0.5 mg by mouth at bedtime.     . cyanocobalamin (,VITAMIN B-12,) 1000 MCG/ML injection Inject 1 mL (1,000 mcg total) into the muscle every 30 (thirty) days. 10 mL prn  .  dexlansoprazole (DEXILANT) 60 MG capsule Take 1 capsule (60 mg total) by mouth daily. 90 capsule 3  . furosemide (LASIX) 40 MG tablet TAKE ONE TABLET BY MOUTH ONCE DAILY. 90 tablet 1  . gabapentin (NEURONTIN) 600 MG tablet Take 600 mg by mouth 2 (two) times daily.     . hydroxychloroquine (PLAQUENIL) 200 MG tablet Take 200 mg by mouth 2 (two) times daily.      Marland Kitchen ibuprofen (ADVIL,MOTRIN) 800 MG tablet Take 800 mg by mouth every 6 (six) hours as needed. Reported on 08/30/2015    . levothyroxine (SYNTHROID, LEVOTHROID) 100 MCG  tablet Take 1 tablet (100 mcg total) by mouth daily. 90 tablet 1  . oxyCODONE-acetaminophen (PERCOCET) 7.5-325 MG per tablet Take 1 tablet by mouth every 4 (four) hours as needed for pain. Reported on 08/30/2015    . potassium chloride (KLOR-CON 10) 10 MEQ tablet Take one tablet daily as directed    . riTUXimab (RITUXAN) 100 MG/10ML injection Inject into the vein. Every 6 months    . rOPINIRole (REQUIP) 0.5 MG tablet Take 1 mg by mouth at bedtime.     Marland Kitchen warfarin (COUMADIN) 5 MG tablet Take 2 tablets daily except 1 1/2 tablets on Tuesdays and Saturdays 60 tablet 4  . zolpidem (AMBIEN) 10 MG tablet Take 10 mg by mouth at bedtime as needed for sleep.      No current facility-administered medications for this visit.     Allergies:   Review of patient's allergies indicates no known allergies.    Social History:  The patient  reports that she quit smoking about 9 years ago. Her smoking use included Cigarettes. She has a 15.00 pack-year smoking history. She has never used smokeless tobacco. She reports that she drinks alcohol. She reports that she does not use drugs.   Family History:  The patient's family history includes Hyperlipidemia in her mother; Hypertension in her father and mother.    ROS: All other systems are reviewed and negative. Unless otherwise mentioned in H&P    PHYSICAL EXAM: VS:  There were no vitals taken for this visit. , BMI There is no height or weight on file to calculate BMI. GEN: Well nourished, well developed, in no acute distress  HEENT: normal  Neck: no JVD, carotid bruits, or masses Cardiac: RRR; no murmurs, rubs, or gallops,no edema  Respiratory: some crackles are noted in the right middle lobe. Mild crackles in the left base MS: no deformity or atrophy  Skin: warm and dry, no rash Neuro:  Strength and sensation are intact Psych: euthymic mood, full affect  and Recent Labs: 06/03/2015: TSH 0.13    Lipid Panel    Component Value Date/Time   CHOL 229 (H)  09/18/2010 1156   TRIG 111 09/18/2010 1156   HDL 47 09/18/2010 1156   CHOLHDL 4.9 09/18/2010 1156   VLDL 22 09/18/2010 1156   LDLCALC 160 (H) 09/18/2010 1156      Wt Readings from Last 3 Encounters:  08/30/15 175 lb (79.4 kg)  08/12/15 176 lb (79.8 kg)  06/27/15 182 lb (82.6 kg)      Other studies Reviewed: Additional studies/ records that were reviewed today include: echocardiogram 12/27/2014 Review of the above records demonstrates:   Left ventricle: The cavity size was normal. Wall thickness was   normal. Systolic function was normal. The estimated ejection   fraction was in the range of 60% to 65%. Cannot assess diastolic   function in setting of mechanical MV. - Aortic valve:  Valve area (VTI): 1.95 cm^2. Valve area (Vmax):   1.79 cm^2. - Mitral valve: There is a 27-mm St. Jude mechanical valve in the   MV position.   There was no evidence for stenosis. There was no significant   regurgitation. Mean gradient (D): 3 mm Hg. Peak gradient (D): 7   mm Hg. - Left atrium: The atrium was moderately dilated. - Technically adequate study.   ASSESSMENT AND PLAN:  1.  Mitral valve disease: She has a prosthetic St. Jude mitral valve, and remains on Coumadin. She has had no hemoptysis with chronic coughing. She's had her INR checked recently and was found to be within normal limits for her Coumadin therapy (2.5-3.5). I'm going to repeat her echocardiogram however because of the diagnosis of CHF last month to evaluate for changes in LV function.  2. Chronic cough and congestion:recent treatment for pneumonitis with antibiotics and steroids. Going to repeat her chest x-ray to evaluate for any further pathology or ongoing infection.  Chest x-ray has come back, there was no evidence of active infiltrate or effusion seen. There is no active lung disease. I am fording this report to her primary care physician and to Dr. Luan Pulling. She will remain on albuterol inhaler and cough suppressant.  Followup appointment with primary care should discontinue.    Current medicines are reviewed at length with the patient today.    Labs/ tests ordered today include: chest x-ray No orders of the defined types were placed in this encounter.    Disposition:   FU with 1 month  Signed, Jory Sims, NP  09/13/2015 6:53 AM    [facility] [address] [place],  [address] Phone: [phone]; Fax: [phone]

## 2015-09-13 NOTE — Patient Instructions (Signed)
Medication Instructions:  Your physician recommends that you continue on your current medications as directed. Please refer to the Current Medication list given to you today.]   Labwork: NONE  Testing/Procedures: A chest x-ray takes a picture of the organs and structures inside the chest, including the heart, lungs, and blood vessels. This test can show several things, including, whether the heart is enlarges; whether fluid is building up in the lungs; and whether pacemaker / defibrillator leads are still in place.  Your physician has requested that you have an echocardiogram. Echocardiography is a painless test that uses sound waves to create images of your heart. It provides your doctor with information about the size and shape of your heart and how well your heart's chambers and valves are working. This procedure takes approximately one hour. There are no restrictions for this procedure.    Follow-Up: Your physician recommends that you schedule a follow-up appointment in: 1 MONTH     Any Other Special Instructions Will Be Listed Below (If Applicable).     If you need a refill on your cardiac medications before your next appointment, please call your pharmacy.

## 2015-09-26 ENCOUNTER — Ambulatory Visit (INDEPENDENT_AMBULATORY_CARE_PROVIDER_SITE_OTHER): Payer: BLUE CROSS/BLUE SHIELD | Admitting: *Deleted

## 2015-09-26 ENCOUNTER — Telehealth: Payer: Self-pay | Admitting: *Deleted

## 2015-09-26 DIAGNOSIS — N342 Other urethritis: Secondary | ICD-10-CM | POA: Diagnosis not present

## 2015-09-26 DIAGNOSIS — Z952 Presence of prosthetic heart valve: Secondary | ICD-10-CM

## 2015-09-26 DIAGNOSIS — Z6830 Body mass index (BMI) 30.0-30.9, adult: Secondary | ICD-10-CM | POA: Diagnosis not present

## 2015-09-26 DIAGNOSIS — Z954 Presence of other heart-valve replacement: Secondary | ICD-10-CM

## 2015-09-26 DIAGNOSIS — Z1389 Encounter for screening for other disorder: Secondary | ICD-10-CM | POA: Diagnosis not present

## 2015-09-26 DIAGNOSIS — Z5181 Encounter for therapeutic drug level monitoring: Secondary | ICD-10-CM

## 2015-09-26 DIAGNOSIS — Z7901 Long term (current) use of anticoagulants: Secondary | ICD-10-CM | POA: Diagnosis not present

## 2015-09-26 LAB — POCT INR: INR: 5.7

## 2015-09-26 NOTE — Telephone Encounter (Signed)
Spoke with pt.  See coumadin note from today.

## 2015-09-27 ENCOUNTER — Encounter (HOSPITAL_COMMUNITY)
Admission: RE | Admit: 2015-09-27 | Discharge: 2015-09-27 | Disposition: A | Discharge status: Home / Self Care | Payer: BLUE CROSS/BLUE SHIELD | Source: Ambulatory Visit | Attending: Rheumatology | Admitting: Rheumatology

## 2015-09-27 ENCOUNTER — Other Ambulatory Visit (HOSPITAL_COMMUNITY): Payer: BLUE CROSS/BLUE SHIELD

## 2015-09-27 DIAGNOSIS — C50419 Malignant neoplasm of upper-outer quadrant of unspecified female breast: Secondary | ICD-10-CM | POA: Insufficient documentation

## 2015-09-27 DIAGNOSIS — D591 Other autoimmune hemolytic anemias: Secondary | ICD-10-CM | POA: Insufficient documentation

## 2015-09-27 DIAGNOSIS — M329 Systemic lupus erythematosus, unspecified: Secondary | ICD-10-CM | POA: Insufficient documentation

## 2015-09-27 NOTE — Progress Notes (Signed)
Patient called today, stated that she has a UTI and is on antibiotics and needs to reschedule. Pt. Informed to reschedule 2 weeks after no further symptoms and after scheduled antibiotics are completed. Patient verbalizes understanding.

## 2015-09-28 ENCOUNTER — Ambulatory Visit (INDEPENDENT_AMBULATORY_CARE_PROVIDER_SITE_OTHER): Payer: BLUE CROSS/BLUE SHIELD | Admitting: *Deleted

## 2015-09-28 DIAGNOSIS — Z5181 Encounter for therapeutic drug level monitoring: Secondary | ICD-10-CM | POA: Diagnosis not present

## 2015-09-28 DIAGNOSIS — Z954 Presence of other heart-valve replacement: Secondary | ICD-10-CM

## 2015-09-28 DIAGNOSIS — Z952 Presence of prosthetic heart valve: Secondary | ICD-10-CM

## 2015-09-28 LAB — POCT INR: INR: 5.4

## 2015-09-29 ENCOUNTER — Other Ambulatory Visit: Payer: Self-pay | Admitting: Cardiology

## 2015-09-29 DIAGNOSIS — N651 Disproportion of reconstructed breast: Secondary | ICD-10-CM | POA: Diagnosis not present

## 2015-09-29 DIAGNOSIS — Z853 Personal history of malignant neoplasm of breast: Secondary | ICD-10-CM | POA: Diagnosis not present

## 2015-10-03 ENCOUNTER — Ambulatory Visit (INDEPENDENT_AMBULATORY_CARE_PROVIDER_SITE_OTHER): Payer: BLUE CROSS/BLUE SHIELD | Admitting: *Deleted

## 2015-10-03 DIAGNOSIS — Z954 Presence of other heart-valve replacement: Secondary | ICD-10-CM

## 2015-10-03 DIAGNOSIS — M329 Systemic lupus erythematosus, unspecified: Secondary | ICD-10-CM | POA: Diagnosis not present

## 2015-10-03 DIAGNOSIS — M0609 Rheumatoid arthritis without rheumatoid factor, multiple sites: Secondary | ICD-10-CM | POA: Diagnosis not present

## 2015-10-03 DIAGNOSIS — Z5181 Encounter for therapeutic drug level monitoring: Secondary | ICD-10-CM | POA: Diagnosis not present

## 2015-10-03 DIAGNOSIS — Z952 Presence of prosthetic heart valve: Secondary | ICD-10-CM

## 2015-10-03 DIAGNOSIS — Z79899 Other long term (current) drug therapy: Secondary | ICD-10-CM | POA: Diagnosis not present

## 2015-10-03 DIAGNOSIS — M2550 Pain in unspecified joint: Secondary | ICD-10-CM | POA: Diagnosis not present

## 2015-10-03 LAB — POCT INR: INR: 1.2

## 2015-10-06 DIAGNOSIS — H43813 Vitreous degeneration, bilateral: Secondary | ICD-10-CM | POA: Diagnosis not present

## 2015-10-06 DIAGNOSIS — H35371 Puckering of macula, right eye: Secondary | ICD-10-CM | POA: Diagnosis not present

## 2015-10-06 DIAGNOSIS — Z79899 Other long term (current) drug therapy: Secondary | ICD-10-CM | POA: Diagnosis not present

## 2015-10-10 ENCOUNTER — Ambulatory Visit (INDEPENDENT_AMBULATORY_CARE_PROVIDER_SITE_OTHER): Payer: BLUE CROSS/BLUE SHIELD | Admitting: *Deleted

## 2015-10-10 DIAGNOSIS — Z954 Presence of other heart-valve replacement: Secondary | ICD-10-CM | POA: Diagnosis not present

## 2015-10-10 DIAGNOSIS — E538 Deficiency of other specified B group vitamins: Secondary | ICD-10-CM | POA: Diagnosis not present

## 2015-10-10 DIAGNOSIS — E876 Hypokalemia: Secondary | ICD-10-CM | POA: Diagnosis not present

## 2015-10-10 DIAGNOSIS — Z5181 Encounter for therapeutic drug level monitoring: Secondary | ICD-10-CM | POA: Diagnosis not present

## 2015-10-10 DIAGNOSIS — Z6829 Body mass index (BMI) 29.0-29.9, adult: Secondary | ICD-10-CM | POA: Diagnosis not present

## 2015-10-10 DIAGNOSIS — Z952 Presence of prosthetic heart valve: Secondary | ICD-10-CM

## 2015-10-10 DIAGNOSIS — Z1389 Encounter for screening for other disorder: Secondary | ICD-10-CM | POA: Diagnosis not present

## 2015-10-10 DIAGNOSIS — G894 Chronic pain syndrome: Secondary | ICD-10-CM | POA: Diagnosis not present

## 2015-10-10 LAB — POCT INR: INR: 1.8

## 2015-10-12 ENCOUNTER — Telehealth: Payer: Self-pay | Admitting: Cardiology

## 2015-10-12 NOTE — Telephone Encounter (Signed)
We need to get an official request from the surgeon that indicates the procedure planned and the type of anesthesia.

## 2015-10-12 NOTE — Telephone Encounter (Signed)
Pt is needing  the ok to have surgery. The pt has already discussed the procedure with  [name]. Please advise,

## 2015-10-13 NOTE — Telephone Encounter (Signed)
Requested and received fax requesting specifics about pt surgery. Placed fax in Dr. Myles Gip folder. 8/24-lm

## 2015-10-14 NOTE — Telephone Encounter (Signed)
I will not be back in [place] office until next week. If this is urgent request, please send to the [place] office, otherwise I will review next week.

## 2015-10-14 NOTE — Telephone Encounter (Signed)
Placed in Dr. Myles Gip folder as it is not urgent and can wait for him to return to [place] office.

## 2015-10-17 ENCOUNTER — Ambulatory Visit (INDEPENDENT_AMBULATORY_CARE_PROVIDER_SITE_OTHER): Payer: BLUE CROSS/BLUE SHIELD | Admitting: *Deleted

## 2015-10-17 DIAGNOSIS — Z954 Presence of other heart-valve replacement: Secondary | ICD-10-CM | POA: Diagnosis not present

## 2015-10-17 DIAGNOSIS — Z5181 Encounter for therapeutic drug level monitoring: Secondary | ICD-10-CM

## 2015-10-17 DIAGNOSIS — Z952 Presence of prosthetic heart valve: Secondary | ICD-10-CM

## 2015-10-17 LAB — POCT INR: INR: 3.7

## 2015-10-18 ENCOUNTER — Encounter (HOSPITAL_COMMUNITY): Payer: BLUE CROSS/BLUE SHIELD

## 2015-10-18 ENCOUNTER — Encounter (HOSPITAL_BASED_OUTPATIENT_CLINIC_OR_DEPARTMENT_OTHER): Payer: BLUE CROSS/BLUE SHIELD | Admitting: Hematology & Oncology

## 2015-10-18 ENCOUNTER — Encounter (HOSPITAL_COMMUNITY): Payer: Self-pay | Admitting: Hematology & Oncology

## 2015-10-18 VITALS — BP 112/70 | HR 66 | Temp 98.5°F | Resp 16 | Ht 63.5 in | Wt 175.6 lb

## 2015-10-18 DIAGNOSIS — M329 Systemic lupus erythematosus, unspecified: Secondary | ICD-10-CM | POA: Diagnosis not present

## 2015-10-18 DIAGNOSIS — Z7901 Long term (current) use of anticoagulants: Secondary | ICD-10-CM

## 2015-10-18 DIAGNOSIS — C50412 Malignant neoplasm of upper-outer quadrant of left female breast: Secondary | ICD-10-CM

## 2015-10-18 DIAGNOSIS — C50419 Malignant neoplasm of upper-outer quadrant of unspecified female breast: Secondary | ICD-10-CM

## 2015-10-18 DIAGNOSIS — D591 Other autoimmune hemolytic anemias: Secondary | ICD-10-CM | POA: Diagnosis not present

## 2015-10-18 DIAGNOSIS — E538 Deficiency of other specified B group vitamins: Secondary | ICD-10-CM

## 2015-10-18 LAB — COMPREHENSIVE METABOLIC PANEL
ALK PHOS: 66 U/L (ref 38–126)
ALT: 20 U/L (ref 14–54)
AST: 24 U/L (ref 15–41)
Albumin: 4.4 g/dL (ref 3.5–5.0)
Anion gap: 3 — ABNORMAL LOW (ref 5–15)
BILIRUBIN TOTAL: 0.5 mg/dL (ref 0.3–1.2)
BUN: 11 mg/dL (ref 6–20)
CALCIUM: 8.9 mg/dL (ref 8.9–10.3)
CO2: 29 mmol/L (ref 22–32)
CREATININE: 0.79 mg/dL (ref 0.44–1.00)
Chloride: 107 mmol/L (ref 101–111)
GFR calc non Af Amer: >60 mL/min (ref >60)
GLUCOSE: 84 mg/dL (ref 65–99)
Potassium: 3.8 mmol/L (ref 3.5–5.1)
SODIUM: 139 mmol/L (ref 135–145)
TOTAL PROTEIN: 7.7 g/dL (ref 6.5–8.1)

## 2015-10-18 LAB — CBC WITH DIFFERENTIAL/PLATELET
BASOS ABS: 0 10*3/uL (ref 0.0–0.1)
BASOS PCT: 1 %
EOS ABS: 0.2 10*3/uL (ref 0.0–0.7)
Eosinophils Relative: 3 %
HEMATOCRIT: 38.1 % (ref 36.0–46.0)
Hemoglobin: 12.4 g/dL (ref 12.0–15.0)
Lymphocytes Relative: 17 %
Lymphs Abs: 1 10*3/uL (ref 0.7–4.0)
MCH: 30.3 pg (ref 26.0–34.0)
MCHC: 32.5 g/dL (ref 30.0–36.0)
MCV: 93.2 fL (ref 78.0–100.0)
MONO ABS: 0.4 10*3/uL (ref 0.1–1.0)
MONOS PCT: 6 %
NEUTROS ABS: 4.4 10*3/uL (ref 1.7–7.7)
Neutrophils Relative %: 73 %
Platelets: 218 10*3/uL (ref 150–400)
RBC: 4.09 MIL/uL (ref 3.87–5.11)
RDW: 15 % (ref 11.5–15.5)
WBC: 6 10*3/uL (ref 4.0–10.5)

## 2015-10-18 LAB — RETICULOCYTES
RBC.: 4.04 MIL/uL (ref 3.87–5.11)
RBC.: 4.09 MIL/uL (ref 3.87–5.11)
RETIC COUNT ABSOLUTE: 88.9 10*3/uL (ref 19.0–186.0)
RETIC CT PCT: 2 % (ref 0.4–3.1)
Retic Count, Absolute: 81.8 10*3/uL (ref 19.0–186.0)
Retic Ct Pct: 2.2 % (ref 0.4–3.1)

## 2015-10-18 LAB — LACTATE DEHYDROGENASE: LDH: 315 U/L — ABNORMAL HIGH (ref 98–192)

## 2015-10-18 NOTE — Progress Notes (Signed)
[place]  CONSULT NOTE  Patient Care Team: Sharilyn Sites, MD as PCP - General (Family Medicine) Gavin Pound, MD as Consulting Physician (Internal Medicine) Daneil Dolin, MD as Consulting Physician (Gastroenterology)  CHIEF COMPLAINTS/PURPOSE OF CONSULTATION:   Hx breast cancer Hx hemolytic anemia    Malignant neoplasm of upper outer quadrant of female breast ([place])   12/28/2009 Initial Diagnosis    Left needle biopsy- invasive ductal ca, grade 3, triple negative with Ki-67 92%      01/09/2010 Survivorship    BRCA1 and BRCA2 negative      01/31/2010 - 03/03/2010 Chemotherapy    FEC x 2 cycles, neoadjuvantly      04/20/2010 Surgery    Left simple mastectomy with superior flap excision showing a 1.6 cm invasive ductal carcinoma, grade 3, with negative resection margins, no LVI, 0/3 lymph nodes.  Triple negative with Ki-67 of 92%.      06/05/2010 - 08/28/2010 Chemotherapy    TC x 4 cycles       HISTORY OF PRESENTING ILLNESS:  [name] 45 y.o. 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  Patient still sees Dr. Trudie Reed for ongoing management of her rheumatologic disease.   She was on Plaquenil butcame off of it a month ago because of "eye toxicity." She sees a retina specialist. She was taking Rituxan for maintenance of lupus every 6 months but now is taking it every 4 months. She states that she experiences some joint pain. She also has Raynaud's.   Patient has an artificial mitral valve and she is on Coumadin. Her cardiologist is Dr. Domenic Polite. Her INR is between 2.5-3.5 but patient states INR is very irregular.   She has 3D mammograms every year. She does self breast exams every month.  She was first diagnosed with breast cancer in 2011 and waited two months to get it checked. Cancer was in inner part of left breast she noted a palpable mass.  Dr. Lucia Gaskins completed the surgery. Patient did not have radiation secondary to her SLE, she underwent mastectomy. Her last mammogram was March 23, 2015. Her last breast exam was in May at [facility]. She notes a difficult time with chemotherapy.   Patient states that she still has her uterus and ovaries. She has had endometrial ablation secondary to abnormal vaginal bleeding. She has had no issues with this since  Dr. Tressie Stalker followed her labs every 6 months.   She noticed when her hemoglobin was low, because she states that she felt horrible. Patient states she has thyroid disease and she sees Dr. Dorris Fetch for this.  She is talking with a Psychiatric nurse at [facility] about redoing her left breast reconstruction. She is currently waiting to get approval from cardiology.   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 It was previously from the knees down but it is now only in the feet. She couldn't go up the stairs before but now its just numbness, tingling, pain, and cold.   Patient gets B12 injection every month at PCP, Dr. Hilma Favors. She has never been told she has pernicious anemia.   Per Dr. Jaclyn Prime last office visit dated 05/18/2015:   Patient is here to establish care for hemolytic anemia and history of triple negative breast cancer.   MEDICAL HISTORY:  Past Medical History:  Diagnosis Date  . Abnormal Papanicolaou  smear of cervix with positive human papilloma virus (HPV) test 08/17/2015  . Anticoagulation goal of INR 2.5 to 3.5   . Anxiety   . B12 deficiency   . BRCA1 negative   . BRCA2 negative   . Breast cancer ([place]) 2011   Left side  . Bursitis of right knee    Septic bursitis  . Drug-induced hepatitis    States per her rheumatologist, transaminases elevated but normalized after drug removed for   . Fibromyalgia   . 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 History of abnormal cervical Pap smear 08/12/2015  . Mitral valve  disease, rheumatic    St. Jude prosthesis  . Peripheral neuropathy ([place])   . Pneumonia   . Raynaud disease   . Raynaud disease   . SLE (systemic lupus erythematosus) ([place])     SURGICAL HISTORY: Past Surgical History:  Procedure Laterality Date  . BREAST SURGERY    . CHOLECYSTECTOMY    . ENDOMETRIAL ABLATION  2008   She no longer has menses  . ESOPHAGOGASTRODUODENOSCOPY N/A 03/25/2013   Dr. Raliegh Scarlet reflux esophagitis-likely source of patient's symptoms (patulous EG junction). Hiatal hernia otherwise normal  . Insertion expander left breast  11/16/10  . MASTECTOMY    . MASTOPEXY  02/14/2011   Procedure: MASTOPEXY;  Surgeon: Macon Large;  Location: [place];  Service: Plastics;  Laterality: Bilateral;  Right Breast Reduction   . MITRAL VALVE REPLACEMENT    . TISSUE EXPANDER PLACEMENT  02/14/2011   Procedure: TISSUE EXPANDER;  Surgeon: Macon Large;  Location: [place];  Service: Plastics;  Laterality: Bilateral;  Left Breast Remove Tissue Expander Placement of Implant Breast Reconstruction  . TUBAL LIGATION      SOCIAL HISTORY: Social History   Social History  . Marital status: Married    Spouse name: N/A  . Number of children: 2  . Years of education: N/A   Occupational History  . Not on file.   Social History Main Topics  . Smoking status: Former Smoker    Packs/day: 1.00    Years: 15.00    Types: Cigarettes    Quit date: 02/19/2006  . Smokeless tobacco: Never Used     Comment: Quit x 10 years  . Alcohol use 0.0 oz/week     Comment: Occasional  . Drug use: No  . Sexual activity: Not Currently    Birth control/ protection: Surgical     Comment: tubal and ablation   Other Topics Concern  . Not on file   Social History Narrative  . No narrative on file  Separated, but not legally 2 children, 3 grandchildren Former smoker (quit in 2008) Very little alcohol consumption Reginia Naas is cruising  1 dog Unemployed; on disability    FAMILY HISTORY: Family History    Problem Relation Age of Onset  . Hypertension Mother   . Hyperlipidemia Mother   . Hypertension Father   . Colon cancer Neg Hx   No family history of cancer No family history of lupus Mom dad living and healthy;  Mom is 38 years old and dad is 61 years old 2 siblings, 38 and 6 years old  ALLERGIES:  has No Known Allergies.  MEDICATIONS:  Current Outpatient Prescriptions  Medication Sig Dispense Refill  . Albuterol (VENTOLIN IN) Inhale into the lungs as needed.    . ALPRAZolam (XANAX) 0.5 MG tablet Take 0.5 mg by mouth at bedtime.     . cyanocobalamin (,VITAMIN B-12,) 1000  MCG/ML injection Inject 1 mL (1,000 mcg total) into the muscle every 30 (thirty) days. 10 mL prn  . dexlansoprazole (DEXILANT) 60 MG capsule Take 1 capsule (60 mg total) by mouth daily. 90 capsule 3  . furosemide (LASIX) 40 MG tablet TAKE ONE TABLET BY MOUTH ONCE DAILY. 90 tablet 0  . gabapentin (NEURONTIN) 600 MG tablet Take 600 mg by mouth 2 (two) times daily.     Marland Kitchen ibuprofen (ADVIL,MOTRIN) 800 MG tablet Take 800 mg by mouth every 6 (six) hours as needed. Reported on 08/30/2015    . levothyroxine (SYNTHROID, LEVOTHROID) 100 MCG tablet Take 1 tablet (100 mcg total) by mouth daily. 90 tablet 1  . oxyCODONE-acetaminophen (PERCOCET) 7.5-325 MG per tablet Take 1 tablet by mouth every 4 (four) hours as needed for pain. Reported on 08/30/2015    . potassium chloride (KLOR-CON 10) 10 MEQ tablet Take one tablet daily as directed    . riTUXimab (RITUXAN) 100 MG/10ML injection Inject into the vein. Every 6 months    . rOPINIRole (REQUIP) 0.5 MG tablet Take 1 mg by mouth at bedtime.     Marland Kitchen warfarin (COUMADIN) 5 MG tablet Take 2 tablets daily except 1 1/2 tablets on Tuesdays and Saturdays 60 tablet 4  . zolpidem (AMBIEN) 10 MG tablet Take 10 mg by mouth at bedtime as needed for sleep.      No current facility-administered medications for this visit.     Review of Systems  Constitutional: Negative.   HENT: Negative.   Eyes:  Negative.   Respiratory: Negative.   Cardiovascular: Negative.   Gastrointestinal: Negative.   Genitourinary: Negative.   Musculoskeletal: Negative.   Skin: Negative.   Neurological: Positive for tingling.       Neuropathy mostly in feet, secondary to chemo   Endo/Heme/Allergies: Negative.   Psychiatric/Behavioral: Negative.   All other systems reviewed and are negative.  14 point ROS was done and is otherwise as detailed above or in HPI    PHYSICAL EXAMINATION: ECOG PERFORMANCE STATUS: 1 - Symptomatic but completely ambulatory     Vitals:   10/18/15 1406  BP: 112/70  Pulse: 66  Resp: 16  Temp: 98.5 F (36.9 C)   Filed Weights   10/18/15 1406  Weight: 175 lb 9.6 oz (79.7 kg)    Physical Exam  Constitutional: She is oriented to person, place, and time and well-developed, well-nourished, and in no distress.  HENT:  Head: Normocephalic and atraumatic.  Nose: Nose normal.  Mouth/Throat: Oropharynx is clear and moist. No oropharyngeal exudate.  Eyes: Conjunctivae and EOM are normal. Pupils are equal, round, and reactive to light. Right eye exhibits no discharge. Left eye exhibits no discharge. No scleral icterus.  Neck: Normal range of motion. Neck supple. No tracheal deviation present. No thyromegaly present.  Cardiovascular: Normal rate, regular rhythm and normal heart sounds.  Exam reveals no gallop and no friction rub.   No murmur heard. Prosthetic heart valve noted  Pulmonary/Chest: Effort normal and breath sounds normal. She has no wheezes. She has no rales.  Abdominal: Soft. Bowel sounds are normal. She exhibits no distension and no mass. There is no tenderness. There is no rebound and no guarding.  Musculoskeletal: Normal range of motion. She exhibits no edema.  Lymphadenopathy:    She has no cervical adenopathy.  Neurological: She is alert and oriented to person, place, and time. She has normal reflexes. No cranial nerve deficit. Gait normal. Coordination  normal.  Skin: Skin is warm and dry. No  rash noted.  Psychiatric: Mood, memory, affect and judgment normal.  Nursing note and vitals reviewed.   LABORATORY DATA:  I have reviewed the data as listed     RADIOGRAPHIC STUDIES: I have personally reviewed the radiological images as listed and agreed with the findings in the report. No results found. Study Result   CLINICAL DATA:  Screening.  EXAM: DIGITAL SCREENING UNILATERAL RIGHT MAMMOGRAM WITH CAD  COMPARISON:  Previous exam(s).  ACR Breast Density Category a: The breast tissue is almost entirely fatty.  FINDINGS: The patient has had a left mastectomy. There are no findings suspicious for malignancy. Images were processed with CAD.  IMPRESSION: No mammographic evidence of malignancy. A result letter of this screening mammogram will be mailed directly to the patient.  RECOMMENDATION: Screening mammogram in one year.  (Code:SM-R-74M)  BI-RADS CATEGORY  1: Negative.   Electronically Signed   By: Ammie Ferrier M.D.   On: 03/25/2015 08:02   PATHOLOGY:    ASSESSMENT & PLAN:  Malignant neoplasm of upper outer quadrant of female breast, Triple negative Stage IIA (T2, N0, cM0)  Lupus Hemolytic Anemia B12 deficiency on B12 monthly  Raynaud's Mitral Valve Replacement with St. Judes Chronic anticoagulation   She is up to date on mammography and breast exam. She currently has no evidence of recurrence of her breast cancer and is 5 years out from diagnosis.   Given that she has discontinued her plaquenil recently we will keep a closer watch on her counts.  Labs will be drawn after visit today. She will return for repeat labs in six weeks. Further follow-up of lab studies will be based upon results moving forwards. She was advised she could call at any time to obtain a CBC if she began to notice symptoms c/w her prior hemolysis. She will follow up in six months. At this next visit, we will preform a repeat   breast exam. She will call if any issues develop before then.    ORDERS PLACED FOR THIS ENCOUNTER: Orders Placed This Encounter  Procedures  . CBC with Differential  . Comprehensive metabolic panel  . Lactate dehydrogenase  . Reticulocytes  . CBC with Differential  . Comprehensive metabolic panel  . Lactate dehydrogenase  . Reticulocytes  . CBC with Differential  . Comprehensive metabolic panel  . Lactate dehydrogenase  . Reticulocytes    All questions were answered. The patient knows to call the clinic with any problems, questions or concerns.  This document serves as a record of services personally performed by Ancil Linsey, MD. It was created on her behalf by Elmyra Ricks, a trained medical scribe. The creation of this record is based on the scribe's personal observations and the provider's statements to them. This document has been checked and approved by the attending provider.  I have reviewed the above documentation for accuracy and completeness and I agree with the above.  This note was electronically signed.    Molli Hazard, MD  10/18/2015 3:01 PM

## 2015-10-18 NOTE — Patient Instructions (Addendum)
[place] at [facility] Discharge Instructions  RECOMMENDATIONS MADE BY THE CONSULTANT AND ANY TEST RESULTS WILL BE SENT TO YOUR REFERRING PHYSICIAN.  You saw Dr. Whitney Muse today. Labs today. Labs in 6 weeks. Follow up in 6 months with labs.  Thank you for choosing [place] at [facility] to provide your oncology and hematology care.  To afford each patient quality time with our provider, please arrive at least 15 minutes before your scheduled appointment time.   Beginning January 23rd 2017 lab work for the [facility] will be done in the  Main lab at [facility] on 1st floor. If you have a lab appointment with the [facility] please come in thru the  Main Entrance and check in at the main information desk  You need to re-schedule your appointment should you arrive 10 or more minutes late.  We strive to give you quality time with our providers, and arriving late affects you and other patients whose appointments are after yours.  Also, if you no show three or more times for appointments you may be dismissed from the clinic at the providers discretion.     Again, thank you for choosing [facility].  Our hope is that these requests will decrease the amount of time that you wait before being seen by our physicians.       _____________________________________________________________  Should you have questions after your visit to [facility], please contact our office at [phone] between the hours of 8:30 a.m. and 4:30 p.m.  Voicemails left after 4:30 p.m. will not be returned until the following business day.  For prescription refill requests, have your pharmacy contact our office.         Resources For Cancer Patients and their Caregivers ? American Cancer Society: Can assist with transportation, wigs, general needs, runs Look Good Feel Better.        819-116-3695 ? Cancer Care: Provides financial  assistance, online support groups, medication/co-pay assistance.  1-800-813-HOPE 531-776-0261) ? [facility] Assists [place] cancer patients and their families through emotional , educational and financial support.  781-618-8054 ? [place] DSS Where to apply for food stamps, Medicaid and utility assistance. (704)620-8702 ? [facility]: Transportation to medical appointments. (518)469-5084 ? Social Security Administration: May apply for disability if have a Stage IV cancer. 603-806-0983 (210)004-8423 ? [employer], Disability and Transit Services: Assists with nutrition, care and transit needs. [place] Support Programs: @10RELATIVEDAYS @ > Cancer Support Group  2nd Tuesday of the month 1pm-2pm, Journey Room  > Creative Journey  3rd Tuesday of the month 1130am-1pm, Journey Room  > Look Good Feel Better  1st Wednesday of the month 10am-12 noon, Journey Room (Call [place] to register [phone])

## 2015-10-19 ENCOUNTER — Telehealth (HOSPITAL_COMMUNITY): Payer: Self-pay | Admitting: *Deleted

## 2015-10-19 ENCOUNTER — Ambulatory Visit: Payer: BLUE CROSS/BLUE SHIELD | Admitting: Cardiology

## 2015-10-19 NOTE — Telephone Encounter (Signed)
-----   Message from Patrici Ranks, MD sent at 10/19/2015  3:02 PM EDT ----- Labs WNL. Dr.P

## 2015-10-19 NOTE — Telephone Encounter (Signed)
Pt aware that labs are normal.

## 2015-10-20 ENCOUNTER — Other Ambulatory Visit: Payer: Self-pay | Admitting: "Endocrinology

## 2015-10-21 ENCOUNTER — Encounter (HOSPITAL_COMMUNITY): Payer: Self-pay

## 2015-10-21 ENCOUNTER — Encounter (HOSPITAL_COMMUNITY)
Admission: RE | Admit: 2015-10-21 | Discharge: 2015-10-21 | Disposition: A | Discharge status: Home / Self Care | Payer: BLUE CROSS/BLUE SHIELD | Source: Ambulatory Visit | Attending: Rheumatology | Admitting: Rheumatology

## 2015-10-21 DIAGNOSIS — M0609 Rheumatoid arthritis without rheumatoid factor, multiple sites: Secondary | ICD-10-CM | POA: Insufficient documentation

## 2015-10-21 MED ORDER — METHYLPREDNISOLONE SODIUM SUCC 125 MG IJ SOLR
100.0000 mg | Freq: Once | INTRAMUSCULAR | Status: AC
  Administered 2015-10-21: 100 mg via INTRAVENOUS
  Filled 2015-10-21: qty 2

## 2015-10-21 MED ORDER — SODIUM CHLORIDE 0.9 % IV SOLN
INTRAVENOUS | Status: DC
  Administered 2015-10-21: 09:00:00 via INTRAVENOUS

## 2015-10-21 MED ORDER — DIPHENHYDRAMINE HCL 50 MG/ML IJ SOLN
25.0000 mg | Freq: Once | INTRAMUSCULAR | Status: AC
  Administered 2015-10-21: 25 mg via INTRAVENOUS
  Filled 2015-10-21: qty 1

## 2015-10-21 MED ORDER — RITUXIMAB CHEMO INJECTION 500 MG/50ML
1000.0000 mg | Freq: Once | INTRAVENOUS | Status: AC
  Administered 2015-10-21: 1000 mg via INTRAVENOUS
  Filled 2015-10-21: qty 100

## 2015-10-21 MED ORDER — ACETAMINOPHEN 325 MG PO TABS
650.0000 mg | ORAL_TABLET | Freq: Once | ORAL | Status: AC
  Administered 2015-10-21: 650 mg via ORAL
  Filled 2015-10-21: qty 2

## 2015-10-21 MED ORDER — LORATADINE 10 MG PO TABS
10.0000 mg | ORAL_TABLET | Freq: Once | ORAL | Status: AC
  Administered 2015-10-21: 10 mg via ORAL
  Filled 2015-10-21: qty 1

## 2015-11-07 ENCOUNTER — Ambulatory Visit (INDEPENDENT_AMBULATORY_CARE_PROVIDER_SITE_OTHER): Payer: BLUE CROSS/BLUE SHIELD | Admitting: *Deleted

## 2015-11-07 ENCOUNTER — Ambulatory Visit (HOSPITAL_COMMUNITY)
Admission: RE | Admit: 2015-11-07 | Discharge: 2015-11-07 | Disposition: A | Discharge status: Home / Self Care | Payer: BLUE CROSS/BLUE SHIELD | Source: Ambulatory Visit | Attending: Cardiology | Admitting: Cardiology

## 2015-11-07 ENCOUNTER — Encounter (HOSPITAL_COMMUNITY)
Admission: RE | Admit: 2015-11-07 | Discharge: 2015-11-07 | Disposition: A | Discharge status: Home / Self Care | Payer: BLUE CROSS/BLUE SHIELD | Source: Ambulatory Visit | Attending: Rheumatology | Admitting: Rheumatology

## 2015-11-07 DIAGNOSIS — M0609 Rheumatoid arthritis without rheumatoid factor, multiple sites: Secondary | ICD-10-CM | POA: Diagnosis not present

## 2015-11-07 DIAGNOSIS — I517 Cardiomegaly: Secondary | ICD-10-CM | POA: Insufficient documentation

## 2015-11-07 DIAGNOSIS — Z952 Presence of prosthetic heart valve: Secondary | ICD-10-CM

## 2015-11-07 DIAGNOSIS — Z954 Presence of other heart-valve replacement: Secondary | ICD-10-CM | POA: Insufficient documentation

## 2015-11-07 DIAGNOSIS — Z5181 Encounter for therapeutic drug level monitoring: Secondary | ICD-10-CM | POA: Diagnosis not present

## 2015-11-07 LAB — ECHOCARDIOGRAM COMPLETE
Area-P 1/2: 2.89 cm2
CHL CUP MV M VEL: 84.9
CHL CUP RV SYS PRESS: 27 mmHg
E decel time: 257 msec
EERAT: 21.81
FS: 32 % (ref 28–44)
IV/PV OW: 0.98
LA ID, A-P, ES: 38 mm
LA vol: 78.7 mL
LADIAMINDEX: 1.99 cm/m2
LAVOLA4C: 80.4 mL
LAVOLIN: 41.1 mL/m2
LDCA: 2.84 cm2
LEFT ATRIUM END SYS DIAM: 38 mm
LV PW d: 9.35 mm — AB (ref 0.6–1.1)
LV TDI E'LATERAL: 6.74
LV TDI E'MEDIAL: 6.96
LV dias vol index: 35 mL/m2
LV e' LATERAL: 6.74 cm/s
LV sys vol: 28 mL (ref 14–42)
LVDIAVOL: 68 mL (ref 46–106)
LVEEAVG: 21.81
LVEEMED: 21.81
LVOT MV VTI INDEX: 0.6 cm2/m2
LVOT MV VTI: 1.15
LVOT SV: 54 mL
LVOT VTI: 19 cm
LVOT diameter: 19 mm
LVOT peak grad rest: 2 mmHg
LVOTPV: 71.8 cm/s
LVSYSVOLIN: 15 mL/m2
Lateral S' vel: 10 cm/s
MV Annulus VTI: 47.1 cm
MV Dec: 257
MV pk A vel: 105 m/s
MV pk E vel: 147 m/s
MVPG: 9 mmHg
Mean grad: 4 mmHg
P 1/2 time: 76 ms
RV TAPSE: 22.1 mm
Reg peak vel: 247 cm/s
Simpson's disk: 59
Stroke v: 40 ml
TR max vel: 247 cm/s

## 2015-11-07 LAB — POCT INR: INR: 3.7

## 2015-11-07 MED ORDER — ACETAMINOPHEN 325 MG PO TABS
650.0000 mg | ORAL_TABLET | Freq: Once | ORAL | Status: AC
  Administered 2015-11-07: 650 mg via ORAL
  Filled 2015-11-07: qty 2

## 2015-11-07 MED ORDER — SODIUM CHLORIDE 0.9 % IV SOLN
Freq: Once | INTRAVENOUS | Status: AC
  Administered 2015-11-07: 11:00:00 via INTRAVENOUS

## 2015-11-07 MED ORDER — RITUXIMAB CHEMO INJECTION 500 MG/50ML
1000.0000 mg | Freq: Once | INTRAVENOUS | Status: AC
  Administered 2015-11-07: 1000 mg via INTRAVENOUS
  Filled 2015-11-07: qty 100

## 2015-11-07 MED ORDER — DIPHENHYDRAMINE HCL 25 MG PO TABS
50.0000 mg | ORAL_TABLET | Freq: Once | ORAL | Status: DC
  Filled 2015-11-07: qty 2

## 2015-11-07 MED ORDER — DIPHENHYDRAMINE HCL 50 MG/ML IJ SOLN
25.0000 mg | Freq: Once | INTRAMUSCULAR | Status: AC
Start: 2015-11-07 — End: 2015-11-07
  Administered 2015-11-07: 25 mg via INTRAVENOUS

## 2015-11-07 MED ORDER — METHYLPREDNISOLONE SODIUM SUCC 125 MG IJ SOLR
125.0000 mg | Freq: Once | INTRAMUSCULAR | Status: AC
Start: 2015-11-07 — End: 2015-11-07
  Administered 2015-11-07: 125 mg via INTRAVENOUS
  Filled 2015-11-07: qty 2

## 2015-11-07 MED ORDER — DIPHENHYDRAMINE HCL 50 MG/ML IJ SOLN
INTRAMUSCULAR | Status: AC
  Filled 2015-11-07: qty 1

## 2015-11-07 NOTE — Progress Notes (Signed)
*  PRELIMINARY RESULTS* Echocardiogram 2D Echocardiogram has been performed.  [name] 11/07/2015, 10:19 AM

## 2015-11-08 MED ORDER — FENTANYL CITRATE (PF) 100 MCG/2ML IJ SOLN
INTRAMUSCULAR | Status: AC
  Filled 2015-11-08: qty 2

## 2015-11-08 MED ORDER — MIDAZOLAM HCL 2 MG/2ML IJ SOLN
INTRAMUSCULAR | Status: AC
  Filled 2015-11-08: qty 2

## 2015-11-08 MED ORDER — GLYCOPYRROLATE 0.2 MG/ML IJ SOLN
INTRAMUSCULAR | Status: AC
  Filled 2015-11-08: qty 1

## 2015-11-08 MED ORDER — PROPOFOL 10 MG/ML IV BOLUS
INTRAVENOUS | Status: AC
Start: 2015-11-08 — End: 2015-11-08
  Filled 2015-11-08: qty 20

## 2015-11-08 MED ORDER — ONDANSETRON HCL 4 MG/2ML IJ SOLN
INTRAMUSCULAR | Status: AC
  Filled 2015-11-08: qty 2

## 2015-11-14 ENCOUNTER — Ambulatory Visit (INDEPENDENT_AMBULATORY_CARE_PROVIDER_SITE_OTHER): Payer: BLUE CROSS/BLUE SHIELD | Admitting: Adult Health

## 2015-11-14 ENCOUNTER — Encounter: Payer: Self-pay | Admitting: Adult Health

## 2015-11-14 VITALS — BP 110/80 | HR 73 | Ht 64.0 in | Wt 173.0 lb

## 2015-11-14 DIAGNOSIS — A499 Bacterial infection, unspecified: Secondary | ICD-10-CM | POA: Diagnosis not present

## 2015-11-14 DIAGNOSIS — N949 Unspecified condition associated with female genital organs and menstrual cycle: Secondary | ICD-10-CM | POA: Insufficient documentation

## 2015-11-14 DIAGNOSIS — N898 Other specified noninflammatory disorders of vagina: Secondary | ICD-10-CM | POA: Diagnosis not present

## 2015-11-14 DIAGNOSIS — N760 Acute vaginitis: Secondary | ICD-10-CM | POA: Insufficient documentation

## 2015-11-14 DIAGNOSIS — N939 Abnormal uterine and vaginal bleeding, unspecified: Secondary | ICD-10-CM

## 2015-11-14 DIAGNOSIS — N9489 Other specified conditions associated with female genital organs and menstrual cycle: Secondary | ICD-10-CM | POA: Insufficient documentation

## 2015-11-14 DIAGNOSIS — B9689 Other specified bacterial agents as the cause of diseases classified elsewhere: Secondary | ICD-10-CM | POA: Insufficient documentation

## 2015-11-14 LAB — POCT WET PREP (WET MOUNT)
CLUE CELLS WET PREP WHIFF POC: NEGATIVE
WBC, Wet Prep HPF POC: POSITIVE

## 2015-11-14 MED ORDER — METRONIDAZOLE 0.75 % VA GEL: 1.0000 | Freq: Every day | VAGINAL | 0 refills | Status: DC

## 2015-11-14 NOTE — Patient Instructions (Signed)
Bacterial Vaginosis Bacterial vaginosis is a vaginal infection that occurs when the normal balance of bacteria in the vagina is disrupted. It results from an overgrowth of certain bacteria. This is the most common vaginal infection in women of childbearing age. Treatment is important to prevent complications, especially in pregnant women, as it can cause a premature delivery. CAUSES  Bacterial vaginosis is caused by an increase in harmful bacteria that are normally present in smaller amounts in the vagina. Several different kinds of bacteria can cause bacterial vaginosis. However, the reason that the condition develops is not fully understood. RISK FACTORS Certain activities or behaviors can put you at an increased risk of developing bacterial vaginosis, including: Having a new sex partner or multiple sex partners. Douching. Using an intrauterine device (IUD) for contraception. Women do not get bacterial vaginosis from toilet seats, bedding, swimming pools, or contact with objects around them. SIGNS AND SYMPTOMS  Some women with bacterial vaginosis have no signs or symptoms. Common symptoms include: Grey vaginal discharge. A fishlike odor with discharge, especially after sexual intercourse. Itching or burning of the vagina and vulva. Burning or pain with urination. DIAGNOSIS  Your health care provider will take a medical history and examine the vagina for signs of bacterial vaginosis. A sample of vaginal fluid may be taken. Your health care provider will look at this sample under a microscope to check for bacteria and abnormal cells. A vaginal pH test may also be done.  TREATMENT  Bacterial vaginosis may be treated with antibiotic medicines. These may be given in the form of a pill or a vaginal cream. A second round of antibiotics may be prescribed if the condition comes back after treatment. Because bacterial vaginosis increases your risk for sexually transmitted diseases, getting treated can  help reduce your risk for chlamydia, gonorrhea, HIV, and herpes. HOME CARE INSTRUCTIONS  Only take over-the-counter or prescription medicines as directed by your health care provider. If antibiotic medicine was prescribed, take it as directed. Make sure you finish it even if you start to feel better. Tell all sexual partners that you have a vaginal infection. They should see their health care provider and be treated if they have problems, such as a mild rash or itching. During treatment, it is important that you follow these instructions: Avoid sexual activity or use condoms correctly. Do not douche. Avoid alcohol as directed by your health care provider. Avoid breastfeeding as directed by your health care provider. SEEK MEDICAL CARE IF:  Your symptoms are not improving after 3 days of treatment. You have increased discharge or pain. You have a fever. MAKE SURE YOU:  Understand these instructions. Will watch your condition. Will get help right away if you are not doing well or get worse. FOR MORE INFORMATION  Centers for Disease Control and Prevention, Division of STD Prevention: AppraiserFraud.fi American Sexual Health Association (ASHA): www.ashastd.org    This information is not intended to replace advice given to you by your health care provider. Make sure you discuss any questions you have with your health care provider.   Document Released: 02/05/2005 Document Revised: 02/26/2014 Document Reviewed: 09/17/2012 Elsevier Interactive Patient Education 2016 Elsevier Inc. Dysfunctional Uterine Bleeding Dysfunctional uterine bleeding is abnormal bleeding from the uterus. Dysfunctional uterine bleeding includes:  A period that comes earlier or later than usual.  A period that is lighter, heavier, or has blood clots.  Bleeding between periods.  Skipping one or more periods.  Bleeding after sexual intercourse.  Bleeding after menopause. HOME CARE  INSTRUCTIONS  Pay attention to any  changes in your symptoms. Follow these instructions to help with your condition: Eating  Eat well-balanced meals. Include foods that are high in iron, such as liver, meat, shellfish, green leafy vegetables, and eggs.  If you become constipated:  Drink plenty of water.  Eat fruits and vegetables that are high in water and fiber, such as spinach, carrots, raspberries, apples, and mango. Medicines  Take over-the-counter and prescription medicines only as told by your health care provider.  Do not change medicines without talking with your health care provider.  Aspirin or medicines that contain aspirin may make the bleeding worse. Do not take those medicines:  During the week before your period.  During your period.  If you were prescribed iron pills, take them as told by your health care provider. Iron pills help to replace iron that your body loses because of this condition. Activity  If you need to change your sanitary pad or tampon more than one time every 2 hours:  Lie in bed with your feet raised (elevated).  Place a cold pack on your lower abdomen.  Rest as much as possible until the bleeding stops or slows down.  Do not try to lose weight until the bleeding has stopped and your blood iron level is back to normal. Other Instructions  For two months, write down:  When your period starts.  When your period ends.  When any abnormal bleeding occurs.  What problems you notice.  Keep all follow up visits as told by your health care provider. This is important. SEEK MEDICAL CARE IF:  You get light-headed or weak.  You have nausea and vomiting.  You cannot eat or drink without vomiting.  You feel dizzy or have diarrhea while you are taking medicines.  You are taking birth control pills or hormones, and you want to change them or stop taking them. SEEK IMMEDIATE MEDICAL CARE IF:  You develop a fever or chills.  You need to change your sanitary pad or tampon  more than one time per hour.  Your bleeding becomes heavier, or your flow contains clots more often.  You develop pain in your abdomen.  You lose consciousness.  You develop a rash.   This information is not intended to replace advice given to you by your health care provider. Make sure you discuss any questions you have with your health care provider.   Document Released: 02/03/2000 Document Revised: 10/27/2014 Document Reviewed: 05/03/2014 Elsevier Interactive Patient Education 2016 [employer]. No alcohol Return 9/29 at 10 am

## 2015-11-14 NOTE — Progress Notes (Signed)
Subjective:     Patient ID: [name], female   DOB: [birth year], 45 y.o.   MRN: [id number]  HPI [name] is a 45 year old white female in complaining of having had heavy bleeding in August for 3 days, had to change every 1.5 -2 hours, she is sp ablation.She has a history of breast cancer.Has not had sex in years.  Review of Systems +vaginal bleeding after ablation Vaginal discharge Vaginal burning  Reviewed past medical,surgical, social and family history. Reviewed medications and allergies.     Objective:   Physical Exam BP 110/80 (BP Location: Right Arm, Patient Position: Sitting, Cuff Size: Normal)   Pulse 73   Ht 5\' 4"  (1.626 m)   Wt 173 lb (78.5 kg)   BMI 29.70 kg/m  Skin warm and dry.Pelvic: external genitalia is normal in appearance no lesions, vagina: white discharge without odor,urethra has no lesions or masses noted, cervix:smooth and bulbous, uterus: normal size, shape and contour, non tender, no masses felt, adnexa: no masses or tenderness noted. Bladder is non tender and no masses felt. Wet prep: + for clue cells and +WBCs. PHQ 2 0.    Assessment:     1. Abnormal uterine bleeding (AUB)   2. Vaginal discharge   3. Vaginal burning   4. BV (bacterial vaginosis)       Plan:     Rx metrogel use 1 applicator in vagina at HS x 1 week Return 9/29 at GYN [place] Review handouts on BV and AUB No alcohol

## 2015-11-15 ENCOUNTER — Telehealth: Payer: Self-pay | Admitting: Cardiology

## 2015-11-16 NOTE — Telephone Encounter (Signed)
Medical clearance faxed to [facility] @ [phone]. Conformation page received. Clearance filed to be scanned.

## 2015-11-18 ENCOUNTER — Ambulatory Visit (INDEPENDENT_AMBULATORY_CARE_PROVIDER_SITE_OTHER): Payer: BLUE CROSS/BLUE SHIELD

## 2015-11-18 DIAGNOSIS — N83291 Other ovarian cyst, right side: Secondary | ICD-10-CM | POA: Diagnosis not present

## 2015-11-18 DIAGNOSIS — N939 Abnormal uterine and vaginal bleeding, unspecified: Secondary | ICD-10-CM | POA: Diagnosis not present

## 2015-11-18 DIAGNOSIS — D251 Intramural leiomyoma of uterus: Secondary | ICD-10-CM | POA: Diagnosis not present

## 2015-11-18 NOTE — Progress Notes (Addendum)
PELVIC US TA/TV: Heterogenous anteverted uterus,ant intramural fibroid 1.5 x 1.5 x .8 cm,EEC 2.7 mm,normal ov's bilat,1.8 x 1.1 x 1.4 cm rt paraovarian simple cyst,small amount of cul de sac fluid,no pain during ultrasound,ov's appear mobile

## 2015-11-28 ENCOUNTER — Ambulatory Visit (INDEPENDENT_AMBULATORY_CARE_PROVIDER_SITE_OTHER): Payer: BLUE CROSS/BLUE SHIELD | Admitting: *Deleted

## 2015-11-28 DIAGNOSIS — Z5181 Encounter for therapeutic drug level monitoring: Secondary | ICD-10-CM

## 2015-11-28 DIAGNOSIS — Z952 Presence of prosthetic heart valve: Secondary | ICD-10-CM | POA: Diagnosis not present

## 2015-11-28 LAB — POCT INR: INR: 3.3

## 2015-11-29 ENCOUNTER — Other Ambulatory Visit (HOSPITAL_COMMUNITY): Payer: BLUE CROSS/BLUE SHIELD

## 2015-12-06 DIAGNOSIS — Z853 Personal history of malignant neoplasm of breast: Secondary | ICD-10-CM | POA: Diagnosis not present

## 2015-12-06 DIAGNOSIS — N651 Disproportion of reconstructed breast: Secondary | ICD-10-CM | POA: Diagnosis not present

## 2015-12-07 ENCOUNTER — Ambulatory Visit (INDEPENDENT_AMBULATORY_CARE_PROVIDER_SITE_OTHER): Payer: BLUE CROSS/BLUE SHIELD | Admitting: *Deleted

## 2015-12-07 ENCOUNTER — Other Ambulatory Visit: Payer: Self-pay | Admitting: "Endocrinology

## 2015-12-07 DIAGNOSIS — R21 Rash and other nonspecific skin eruption: Secondary | ICD-10-CM | POA: Diagnosis not present

## 2015-12-07 DIAGNOSIS — E039 Hypothyroidism, unspecified: Secondary | ICD-10-CM | POA: Diagnosis not present

## 2015-12-07 DIAGNOSIS — E6609 Other obesity due to excess calories: Secondary | ICD-10-CM | POA: Diagnosis not present

## 2015-12-07 DIAGNOSIS — F419 Anxiety disorder, unspecified: Secondary | ICD-10-CM | POA: Diagnosis not present

## 2015-12-07 DIAGNOSIS — Z5181 Encounter for therapeutic drug level monitoring: Secondary | ICD-10-CM | POA: Diagnosis not present

## 2015-12-07 DIAGNOSIS — Z6830 Body mass index (BMI) 30.0-30.9, adult: Secondary | ICD-10-CM | POA: Diagnosis not present

## 2015-12-07 DIAGNOSIS — Z952 Presence of prosthetic heart valve: Secondary | ICD-10-CM

## 2015-12-07 DIAGNOSIS — E538 Deficiency of other specified B group vitamins: Secondary | ICD-10-CM | POA: Diagnosis not present

## 2015-12-07 DIAGNOSIS — I059 Rheumatic mitral valve disease, unspecified: Secondary | ICD-10-CM | POA: Diagnosis not present

## 2015-12-07 DIAGNOSIS — Z1389 Encounter for screening for other disorder: Secondary | ICD-10-CM | POA: Diagnosis not present

## 2015-12-07 DIAGNOSIS — G894 Chronic pain syndrome: Secondary | ICD-10-CM | POA: Diagnosis not present

## 2015-12-07 LAB — POCT INR: INR: 5

## 2015-12-07 LAB — T4, FREE: Free T4: 1.4 ng/dL (ref 0.8–1.8)

## 2015-12-07 LAB — TSH: TSH: 1.93 mIU/L

## 2015-12-07 MED ORDER — ENOXAPARIN SODIUM 80 MG/0.8ML ~~LOC~~ SOLN: 80.0000 mg | Freq: Two times a day (BID) | SUBCUTANEOUS | 1 refills | Status: DC

## 2015-12-07 NOTE — Patient Instructions (Signed)
10/27  Take last dose of coumadin 10/28  No lovenox or coumadin 10/29  Lovenox 80mg  sq twice daily at 7am and 7pm 10/30   Lovenox 80mg  sq twice daily at 7am and 7pm 10/31   Lovenox 80mg  sq twice daily at 7am and 7pm 11/1   Lovenox 80mg  sq at 7am and no lovenox in pm 11/2  No lovenox in am---------procedure-------lovenox in pm if OK with surgeon---no coumadin per MD request 11/3   Lovenox 80mg  sq twice daily at 7am and 7pm and coumadin 10mg  pm 11/4  Lovenox 80mg  sq twice daily at 7am and 7pm and coumadin 10mg  pm 11/5  Lovenox 80mg  sq twice daily at 7am and 7pm and coumadin 10mg  pm 11/6  Lovenox 80mg  sq at 7am and INR check at 9:20am

## 2015-12-12 DIAGNOSIS — E6609 Other obesity due to excess calories: Secondary | ICD-10-CM | POA: Diagnosis not present

## 2015-12-12 DIAGNOSIS — M3210 Systemic lupus erythematosus, organ or system involvement unspecified: Secondary | ICD-10-CM | POA: Diagnosis not present

## 2015-12-12 DIAGNOSIS — Z6830 Body mass index (BMI) 30.0-30.9, adult: Secondary | ICD-10-CM | POA: Diagnosis not present

## 2015-12-12 DIAGNOSIS — G894 Chronic pain syndrome: Secondary | ICD-10-CM | POA: Diagnosis not present

## 2015-12-13 ENCOUNTER — Telehealth: Payer: Self-pay | Admitting: *Deleted

## 2015-12-13 DIAGNOSIS — M329 Systemic lupus erythematosus, unspecified: Secondary | ICD-10-CM | POA: Diagnosis not present

## 2015-12-13 DIAGNOSIS — E669 Obesity, unspecified: Secondary | ICD-10-CM | POA: Diagnosis not present

## 2015-12-13 DIAGNOSIS — R942 Abnormal results of pulmonary function studies: Secondary | ICD-10-CM | POA: Diagnosis not present

## 2015-12-13 NOTE — Telephone Encounter (Signed)
Pt called and lvm stating she misplaced her lovenox schedule

## 2015-12-14 ENCOUNTER — Ambulatory Visit: Payer: Medicare Other | Admitting: "Endocrinology

## 2015-12-14 NOTE — Telephone Encounter (Signed)
Left copy of lovenox instructions at front desk for pt to pick up.

## 2015-12-16 ENCOUNTER — Ambulatory Visit (INDEPENDENT_AMBULATORY_CARE_PROVIDER_SITE_OTHER): Payer: BLUE CROSS/BLUE SHIELD | Admitting: Gastroenterology

## 2015-12-16 ENCOUNTER — Encounter: Payer: Self-pay | Admitting: Gastroenterology

## 2015-12-16 VITALS — BP 111/78 | HR 67 | Temp 98.4°F | Ht 64.0 in | Wt 171.8 lb

## 2015-12-16 DIAGNOSIS — K3184 Gastroparesis: Secondary | ICD-10-CM | POA: Diagnosis not present

## 2015-12-16 MED ORDER — METOCLOPRAMIDE HCL 5 MG PO TABS: 5.0000 mg | ORAL_TABLET | Freq: Four times a day (QID) | ORAL | 3 refills | Status: DC

## 2015-12-16 NOTE — Progress Notes (Signed)
Referring Provider: Sharilyn Sites, MD Primary Care Physician:  Purvis Kilts, MD  Primary GI: Dr. Gala Romney    Chief Complaint  Patient presents with  . Bloated    HPI:   [name] is a 45 y.o. female presenting today with a history of erosive esophagitis and gastroparesis. Has done well on low dose Reglan BID historically, PPI. Zofran now covered by insurance and keeps on hand if needed. Unable to take erythromycin as she is on chronic Coumadin.   Notes bloating. Occasional nausea but "not real bad". No vomiting. Reglan once a day but didn't work as well. Dexilant once daily. Having to redo breast reconstructive surgery next week in [place]. Curious about bariatric surgery, stating her neighbor had stomach problems similar to her and was having this done.   Past Medical History:  Diagnosis Date  . Abnormal Papanicolaou smear of cervix with positive human papilloma virus (HPV) test 08/17/2015  . Anticoagulation goal of INR 2.5 to 3.5   . Anxiety   . B12 deficiency   . BRCA1 negative   . BRCA2 negative   . Breast cancer ([place]) 2011   Left side  . Bursitis of right knee    Septic bursitis  . Drug-induced hepatitis    States per her rheumatologist, transaminases elevated but normalized after drug removed for   . Fibromyalgia   . 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 History of abnormal cervical Pap smear 08/12/2015  . Mitral valve disease, rheumatic    St. Jude prosthesis  . Peripheral neuropathy ([place])   . Pneumonia   . Raynaud disease   . Raynaud disease   . SLE (systemic lupus erythematosus) ([place])     Past Surgical History:  Procedure Laterality Date  . BREAST SURGERY    . CHOLECYSTECTOMY    . ENDOMETRIAL ABLATION  2008   She no longer has menses  . ESOPHAGOGASTRODUODENOSCOPY N/A 03/25/2013   Dr. Raliegh Scarlet reflux esophagitis-likely source of patient's symptoms (patulous EG junction). Hiatal hernia otherwise normal  .  Insertion expander left breast  11/16/10  . MASTECTOMY    . MASTOPEXY  02/14/2011   Procedure: MASTOPEXY;  Surgeon: Macon Large;  Location: [facility];  Service: Plastics;  Laterality: Bilateral;  Right Breast Reduction   . MITRAL VALVE REPLACEMENT    . TISSUE EXPANDER PLACEMENT  02/14/2011   Procedure: TISSUE EXPANDER;  Surgeon: Macon Large;  Location: [facility];  Service: Plastics;  Laterality: Bilateral;  Left Breast Remove Tissue Expander Placement of Implant Breast Reconstruction  . TUBAL LIGATION      Current Outpatient Prescriptions  Medication Sig Dispense Refill  . Albuterol (VENTOLIN IN) Inhale into the lungs as needed.    . ALPRAZolam (XANAX) 0.5 MG tablet Take 0.5 mg by mouth at bedtime.     . cyanocobalamin (,VITAMIN B-12,) 1000 MCG/ML injection Inject 1 mL (1,000 mcg total) into the muscle every 30 (thirty) days. 10 mL prn  . dexlansoprazole (DEXILANT) 60 MG capsule Take 1 capsule (60 mg total) by mouth daily. 90 capsule 3  . furosemide (LASIX) 40 MG tablet TAKE ONE TABLET BY MOUTH ONCE DAILY. 90 tablet 0  . gabapentin (NEURONTIN) 600 MG tablet Take 600 mg by mouth 2 (two) times daily.     Marland Kitchen ibuprofen (ADVIL,MOTRIN) 800 MG tablet Take 800 mg by mouth every 6 (six) hours as needed. Reported on 08/30/2015    . levothyroxine (SYNTHROID, LEVOTHROID) 100 MCG tablet TAKE ONE TABLET  BY MOUTH DAILY. 90 tablet 1  . oxyCODONE-acetaminophen (PERCOCET) 7.5-325 MG per tablet Take 1 tablet by mouth every 4 (four) hours as needed for pain. Reported on 08/30/2015    . potassium chloride (KLOR-CON 10) 10 MEQ tablet Take one tablet daily as directed    . riTUXimab (RITUXAN) 100 MG/10ML injection Inject into the vein. Every 6 months    . rOPINIRole (REQUIP) 0.5 MG tablet Take 1 mg by mouth at bedtime.     Marland Kitchen warfarin (COUMADIN) 5 MG tablet Take 2 tablets daily except 1 1/2 tablets on Tuesdays and Saturdays (Patient taking differently: 1 1/2 tablet daily except Mon, Wed, Fri (takes 1 tablet Mon, Wed,  Fri)) 60 tablet 4  . zolpidem (AMBIEN) 10 MG tablet Take 10 mg by mouth at bedtime as needed for sleep.     Derrill Memo ON 12/18/2015] enoxaparin (LOVENOX) 80 MG/0.8ML injection Inject 0.8 mLs (80 mg total) into the skin every 12 (twelve) hours. (Patient not taking: Reported on 12/16/2015) 20 Syringe 1  . metroNIDAZOLE (METROGEL) 0.75 % vaginal gel Place 1 Applicatorful vaginally at bedtime. (Patient not taking: Reported on 12/16/2015) 70 g 0   No current facility-administered medications for this visit.     Allergies as of 12/16/2015  . (No Known Allergies)    Family History  Problem Relation Age of Onset  . Hypertension Mother   . Hyperlipidemia Mother   . Hypertension Father   . Colon cancer Neg Hx     Social History   Social History  . Marital status: Married    Spouse name: N/A  . Number of children: 2  . Years of education: N/A   Social History Main Topics  . Smoking status: Former Smoker    Packs/day: 1.00    Years: 15.00    Types: Cigarettes    Quit date: 02/19/2006  . Smokeless tobacco: Never Used     Comment: Quit x 10 years  . Alcohol use 0.0 oz/week     Comment: Occasional  . Drug use: No  . Sexual activity: Not Currently    Birth control/ protection: Surgical     Comment: tubal and ablation   Other Topics Concern  . None   Social History Narrative  . None    Review of Systems: As mentioned in HPI    Physical Exam: BP 111/78   Pulse 67   Temp 98.4 F (36.9 C) (Oral)   Ht 5' 4"  (1.626 m)   Wt 171 lb 12.8 oz (77.9 kg)   BMI 29.49 kg/m  General:   Alert and oriented. No distress noted. Pleasant and cooperative.  Head:  Normocephalic and atraumatic. Eyes:  Conjuctiva clear without scleral icterus. Mouth:  Oral mucosa pink and moist. Good dentition. No lesions. Abdomen:  +BS, soft, non-tender and non-distended. No rebound or guarding. No HSM or masses noted. Msk:  Symmetrical without gross deformities. Normal posture. Extremities:  Without  edema. Neurologic:  Alert and  oriented x4;  grossly normal neurologically. Psych:  Alert and cooperative. Normal mood and affect.

## 2015-12-16 NOTE — Patient Instructions (Signed)
I sent in Reglan to your pharmacy.   We will see you back in 8 months!

## 2015-12-18 NOTE — Assessment & Plan Note (Signed)
45 year old female with gastroparesis, maintained on low-dose Reglan, Zofran prn, and Dexilant. No vomiting, but she endorses chronic bloating and intermittent nausea that is at baseline. No concerning signs such as weight loss or inability to tolerate diet. Medication options have been limited as she is on Coumadin. She is curious regarding surgical measures for intervention; I discussed with her this was not routinely done unless protracted, severe symptoms. There is no indication for a feeding tube for decompression, as she is tolerating her diet, no vomiting, and is not malnourished. She expressed interest in bariatric surgery and wondered if this would be beneficial for her or alleviate symptoms, as her neighbor is undergoing this soon. Her BMI would not qualify her for bariatric surgery; a Roux-en-Y could be done but the remnant stomach may then need decompression with feeding tube in the future. I discussed this with her and that I did not feel surgery was warranted or needed, as symptom-wise she is doing well. I did tell her that I would reach out to Dr. Lucia Gaskins, who knows the patient from prior procedures, as she desires to know his input. Will continue current medication regimen and return in 8 months.

## 2015-12-19 NOTE — Progress Notes (Signed)
CC'D TO PCP °

## 2015-12-21 HISTORY — PX: BREAST IMPLANT EXCHANGE: SHX6296

## 2015-12-22 DIAGNOSIS — I7300 Raynaud's syndrome without gangrene: Secondary | ICD-10-CM | POA: Diagnosis not present

## 2015-12-22 DIAGNOSIS — Z7901 Long term (current) use of anticoagulants: Secondary | ICD-10-CM | POA: Diagnosis not present

## 2015-12-22 DIAGNOSIS — N651 Disproportion of reconstructed breast: Secondary | ICD-10-CM | POA: Diagnosis not present

## 2015-12-22 DIAGNOSIS — Z87891 Personal history of nicotine dependence: Secondary | ICD-10-CM | POA: Diagnosis not present

## 2015-12-22 DIAGNOSIS — Z853 Personal history of malignant neoplasm of breast: Secondary | ICD-10-CM | POA: Diagnosis not present

## 2015-12-22 DIAGNOSIS — N650 Deformity of reconstructed breast: Secondary | ICD-10-CM | POA: Diagnosis not present

## 2015-12-22 DIAGNOSIS — Z952 Presence of prosthetic heart valve: Secondary | ICD-10-CM | POA: Diagnosis not present

## 2015-12-22 DIAGNOSIS — K219 Gastro-esophageal reflux disease without esophagitis: Secondary | ICD-10-CM | POA: Diagnosis not present

## 2015-12-22 DIAGNOSIS — C50919 Malignant neoplasm of unspecified site of unspecified female breast: Secondary | ICD-10-CM | POA: Diagnosis not present

## 2015-12-22 DIAGNOSIS — E039 Hypothyroidism, unspecified: Secondary | ICD-10-CM | POA: Diagnosis not present

## 2015-12-26 ENCOUNTER — Ambulatory Visit (INDEPENDENT_AMBULATORY_CARE_PROVIDER_SITE_OTHER): Payer: BLUE CROSS/BLUE SHIELD | Admitting: *Deleted

## 2015-12-26 DIAGNOSIS — Z5181 Encounter for therapeutic drug level monitoring: Secondary | ICD-10-CM | POA: Diagnosis not present

## 2015-12-26 DIAGNOSIS — Z952 Presence of prosthetic heart valve: Secondary | ICD-10-CM | POA: Diagnosis not present

## 2015-12-26 LAB — POCT INR: INR: 1.4

## 2015-12-27 ENCOUNTER — Ambulatory Visit (INDEPENDENT_AMBULATORY_CARE_PROVIDER_SITE_OTHER): Payer: BLUE CROSS/BLUE SHIELD | Admitting: "Endocrinology

## 2015-12-27 ENCOUNTER — Encounter: Payer: Self-pay | Admitting: "Endocrinology

## 2015-12-27 VITALS — BP 107/74 | HR 78 | Ht 64.0 in | Wt 170.0 lb

## 2015-12-27 DIAGNOSIS — E039 Hypothyroidism, unspecified: Secondary | ICD-10-CM | POA: Diagnosis not present

## 2015-12-27 MED ORDER — LEVOTHYROXINE SODIUM 100 MCG PO TABS: 100.0000 ug | ORAL_TABLET | Freq: Every day | ORAL | 1 refills | Status: DC

## 2015-12-27 NOTE — Progress Notes (Signed)
HPI  [name] is a 45 y.o.-year-old female, here to f/u for hypothyroidism. She is on LT4 100 mcg po qam. She is compliant and taking her LT4 properly. She  has no new complaints. She has lost about 9 pounds of weight intentionally. She denies heat intolerance.   ROS: Constitutional: +weight loss, no fatigue, no subjective hyperthermia/hypothermia Eyes: no blurry vision, no xerophthalmia ENT: no sore throat, no nodules palpated in throat, no dysphagia/odynophagia, no hoarseness Cardiovascular: no CP/SOB/palpitations/leg swelling Respiratory: no cough/SOB Gastrointestinal: no N/V/D/C Musculoskeletal: no muscle/joint aches Skin: no rashes Neurological: no tremors/numbness/tingling/dizziness Psychiatric: no depression/anxiety  PE: BP 107/74   Pulse 78   Ht '5\' 4"'$  (1.626 m)   Wt 170 lb (77.1 kg)   BMI 29.18 kg/m  Wt Readings from Last 3 Encounters:  12/27/15 170 lb (77.1 kg)  12/16/15 171 lb 12.8 oz (77.9 kg)  11/14/15 173 lb (78.5 kg)   Constitutional: overweight, in NAD Eyes: PERRLA, EOMI, no exophthalmos ENT: moist mucous membranes, no thyromegaly, no cervical lymphadenopathy Cardiovascular: RRR, No MRG Respiratory: CTA B Gastrointestinal: abdomen soft, NT, ND, BS+ Musculoskeletal: no deformities, strength intact in all 4 Skin: moist, warm, no rashes Neurological: no tremor with outstretched hands, DTR normal in all 4  Recent Results (from the past 2160 hour(s))  POCT INR     Status: Abnormal   Collection Time: 10/03/15  1:17 PM  Result Value Ref Range   INR 1.2   POCT INR     Status: Abnormal   Collection Time: 10/10/15 10:55 AM  Result Value Ref Range   INR 1.8   POCT INR     Status: Abnormal   Collection Time: 10/17/15  3:22 PM  Result Value Ref Range   INR 3.7   CBC with Differential     Status: None   Collection Time: 10/18/15  3:18 PM  Result Value Ref Range   WBC 6.0 4.0 - 10.5 K/uL   RBC 4.09 3.87 - 5.11 MIL/uL   Hemoglobin 12.4 12.0 - 15.0  g/dL   HCT 38.1 36.0 - 46.0 %   MCV 93.2 78.0 - 100.0 fL   MCH 30.3 26.0 - 34.0 pg   MCHC 32.5 30.0 - 36.0 g/dL   RDW 15.0 11.5 - 15.5 %   Platelets 218 150 - 400 K/uL   Neutrophils Relative % 73 %   Neutro Abs 4.4 1.7 - 7.7 K/uL   Lymphocytes Relative 17 %   Lymphs Abs 1.0 0.7 - 4.0 K/uL   Monocytes Relative 6 %   Monocytes Absolute 0.4 0.1 - 1.0 K/uL   Eosinophils Relative 3 %   Eosinophils Absolute 0.2 0.0 - 0.7 K/uL   Basophils Relative 1 %   Basophils Absolute 0.0 0.0 - 0.1 K/uL  Comprehensive metabolic panel     Status: Abnormal   Collection Time: 10/18/15  3:18 PM  Result Value Ref Range   Sodium 139 135 - 145 mmol/L   Potassium 3.8 3.5 - 5.1 mmol/L   Chloride 107 101 - 111 mmol/L   CO2 29 22 - 32 mmol/L   Glucose, Bld 84 65 - 99 mg/dL   BUN 11 6 - 20 mg/dL   Creatinine, Ser 0.79 0.44 - 1.00 mg/dL   Calcium 8.9 8.9 - 10.3 mg/dL   Total Protein 7.7 6.5 - 8.1 g/dL   Albumin 4.4 3.5 - 5.0 g/dL   AST 24 15 - 41 U/L   ALT 20 14 - 54 U/L   Alkaline Phosphatase  66 38 - 126 U/L   Total Bilirubin 0.5 0.3 - 1.2 mg/dL   GFR calc non Af Amer >60 >60 mL/min   GFR calc Af Amer >60 >60 mL/min    Comment: (NOTE) The eGFR has been calculated using the CKD EPI equation. This calculation has not been validated in all clinical situations. eGFR's persistently <60 mL/min signify possible Chronic Kidney Disease.    Anion gap 3 (L) 5 - 15  Lactate dehydrogenase     Status: Abnormal   Collection Time: 10/18/15  3:18 PM  Result Value Ref Range   LDH 315 (H) 98 - 192 U/L  Reticulocytes     Status: None   Collection Time: 10/18/15  3:18 PM  Result Value Ref Range   Retic Ct Pct 2.0 0.4 - 3.1 %   RBC. 4.09 3.87 - 5.11 MIL/uL   Retic Count, Manual 81.8 19.0 - 186.0 K/uL  Reticulocytes     Status: None   Collection Time: 10/18/15  3:19 PM  Result Value Ref Range   Retic Ct Pct 2.2 0.4 - 3.1 %   RBC. 4.04 3.87 - 5.11 MIL/uL   Retic Count, Manual 88.9 19.0 - 186.0 K/uL   Echocardiogram     Status: Abnormal   Collection Time: 11/07/15 10:19 AM  Result Value Ref Range   LV PW d 9.35 (A) 0.6 - 1.1 mm   FS 32 28 - 44 %   LA vol 78.7 mL   LA ID, A-P, ES 38 mm   IVS/LV PW RATIO, ED .98    Stroke v 40 ml   LVOT VTI 19 cm   Reg peak vel 247 cm/s   RV sys press 27 mmHg   LV e' LATERAL 6.74 cm/s   LV E/e' medial 21.81    LV E/e'average 21.81    MV Annulus VTI 47.1 cm   LA diam index 1.99 cm/m2   LA vol A4C 80.4 ml   LVOT peak grad rest 2 mmHg   Mean grad 4 mmHg   Area-P 1/2 2.89 cm2   E decel time 257 msec   LVOT diameter 19 mm   LVOT area 2.84 cm2   LVOT peak vel 71.8 cm/s   LVOT SV 54.00 mL   Peak grad 9 mmHg   E/e' ratio 21.81    MV pk E vel 147 m/s   TR max vel 247 cm/s   P 1/2 time 76 ms   MV pk A vel 105 m/s   LV sys vol 28 14 - 42 mL   LV sys vol index 15.0 mL/m2   LV dias vol 68 46 - 106 mL   LV dias vol index 35.0 mL/m2   LA vol index 41.1 mL/m2   MV M vel 84.9    MV Dec 257    LVOT MV VTI 1.15    LVOT MV VTI INDEX .6 cm2/m2   LA diam end sys 38.00 mm   Simpson's disk 59.00    TDI e' medial 6.96    TDI e' lateral 6.74    Lateral S' vel 10.00 cm/sec   TAPSE 22.10 mm  POCT INR     Status: Abnormal   Collection Time: 11/07/15  3:52 PM  Result Value Ref Range   INR 3.7   POCT Wet Prep [facility] [place])     Status: Abnormal   Collection Time: 11/14/15  9:46 AM  Result Value Ref Range   Source Wet Prep POC vagina  WBC, Wet Prep HPF POC pos    Bacteria Wet Prep HPF POC  None, Few, Too numerous to count   BACTERIA WET PREP MORPHOLOGY POC     Clue Cells Wet Prep HPF POC Moderate (A) None, Too numerous to count   Clue Cells Wet Prep Whiff POC Negative Whiff    Yeast Wet Prep HPF POC     KOH Wet Prep POC     Trichomonas Wet Prep HPF POC  Absent  POCT INR     Status: Normal   Collection Time: 11/28/15  9:24 AM  Result Value Ref Range   INR 3.3   TSH     Status: None   Collection Time: 12/07/15  9:31 AM  Result Value Ref Range    TSH 1.93 mIU/L    Comment:   Reference Range   > or = 20 Years  0.40-4.50   Pregnancy Range First trimester  0.26-2.66 Second trimester 0.55-2.73 Third trimester  0.43-2.91     T4, free     Status: None   Collection Time: 12/07/15  9:31 AM  Result Value Ref Range   Free T4 1.4 0.8 - 1.8 ng/dL  POCT INR     Status: Abnormal   Collection Time: 12/07/15  4:39 PM  Result Value Ref Range   INR 5.0   POCT INR     Status: Abnormal   Collection Time: 12/26/15  9:26 AM  Result Value Ref Range   INR 1.4     ASSESSMENT: 1. Hypothyroidism  PLAN:    -Patient with long-standing hypothyroidism, on levothyroxine therapy.  - Thyroid function tests are consistent with appropriate replacement. I advised her to continue  Levothyroxine 100 g by mouth every morning. - We discussed about correct intake of levothyroxine, at fasting, with water, separated by at least 30 minutes from breakfast, and separated by more than 4 hours from calcium, iron, multivitamins, acid reflux medications (PPIs). -Patient is made aware of the fact that thyroid hormone replacement is needed for life, dose to be adjusted by periodic monitoring of thyroid function tests. - Will check thyroid tests before next visit: TSH, free T4   Glade Lloyd, MD Phone: [phone]  Fax: [phone]   12/27/2015, 3:37 PM

## 2015-12-29 ENCOUNTER — Ambulatory Visit (INDEPENDENT_AMBULATORY_CARE_PROVIDER_SITE_OTHER): Payer: BLUE CROSS/BLUE SHIELD | Admitting: *Deleted

## 2015-12-29 DIAGNOSIS — Z952 Presence of prosthetic heart valve: Secondary | ICD-10-CM | POA: Diagnosis not present

## 2015-12-29 DIAGNOSIS — Z5181 Encounter for therapeutic drug level monitoring: Secondary | ICD-10-CM | POA: Diagnosis not present

## 2015-12-29 LAB — POCT INR: INR: 3.1

## 2015-12-29 NOTE — Progress Notes (Signed)
Cardiology Office Note  Date: 12/30/2015   ID: [name], DOB [birth year], MRN [id number]  PCP: Purvis Kilts, MD  Primary Cardiologist: Rozann Lesches, MD   Chief Complaint  Patient presents with  . Valvular heart disease    History of Present Illness: [name] is a 45 y.o. female last seen in the office by Ms. Lawrence NP in July. I reviewed her records, she underwent recent breast surgery, was off Coumadin and transitioned to Lovenox bridge which she has just completed. She has done well. Does not report any unusual shortness of breath, palpitations, or chest pain.  She continues on Coumadin with follow-up in anticoagulation clinic. INR was 3.1 yesterday.  Follow-up echocardiogram from September as outlined below, LVEF normal and mitral valve function also within normal range.  Past Medical History:  Diagnosis Date  . Abnormal Papanicolaou smear of cervix with positive human papilloma virus (HPV) test 08/17/2015  . Anticoagulation goal of INR 2.5 to 3.5   . Anxiety   . B12 deficiency   . BRCA1 negative   . BRCA2 negative   . Breast cancer ([place]) 2011   Left side  . Bursitis of right knee    Septic bursitis  . Drug-induced hepatitis    States per her rheumatologist, transaminases elevated but normalized after drug removed for   . Fibromyalgia   . 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 History of abnormal cervical Pap smear 08/12/2015  . Mitral valve disease, rheumatic    St. Jude prosthesis  . Peripheral neuropathy ([place])   . Pneumonia   . Raynaud disease   . Raynaud disease   . SLE (systemic lupus erythematosus) ([place])     Current Outpatient Prescriptions  Medication Sig Dispense Refill  . Albuterol (VENTOLIN IN) Inhale into the lungs as needed.    . ALPRAZolam (XANAX) 0.5 MG tablet Take 0.5 mg by mouth at bedtime.     . cyanocobalamin (,VITAMIN B-12,) 1000 MCG/ML injection Inject 1 mL (1,000 mcg total) into the  muscle every 30 (thirty) days. 10 mL prn  . dexlansoprazole (DEXILANT) 60 MG capsule Take 1 capsule (60 mg total) by mouth daily. 90 capsule 3  . furosemide (LASIX) 40 MG tablet TAKE ONE TABLET BY MOUTH ONCE DAILY. 90 tablet 0  . gabapentin (NEURONTIN) 600 MG tablet Take 600 mg by mouth 2 (two) times daily.     Marland Kitchen ibuprofen (ADVIL,MOTRIN) 800 MG tablet Take 800 mg by mouth every 6 (six) hours as needed. Reported on 08/30/2015    . levothyroxine (SYNTHROID, LEVOTHROID) 100 MCG tablet Take 1 tablet (100 mcg total) by mouth daily. 90 tablet 1  . metoCLOPramide (REGLAN) 5 MG tablet Take 5 mg by mouth 2 (two) times daily.    Marland Kitchen oxyCODONE-acetaminophen (PERCOCET) 7.5-325 MG per tablet Take 1 tablet by mouth every 4 (four) hours as needed for pain. Reported on 08/30/2015    . potassium chloride (KLOR-CON 10) 10 MEQ tablet Take one tablet daily as directed    . riTUXimab (RITUXAN) 100 MG/10ML injection Inject into the vein. Every 6 months    . rOPINIRole (REQUIP) 0.5 MG tablet Take 1 mg by mouth at bedtime.     Marland Kitchen warfarin (COUMADIN) 5 MG tablet Take 2 tablets daily except 1 1/2 tablets on Tuesdays and Saturdays (Patient taking differently: 1 1/2 tablet daily except Mon, Wed, Fri (takes 1 tablet Mon, Wed, Fri)) 60 tablet 4  . zolpidem (AMBIEN) 10 MG tablet Take  10 mg by mouth at bedtime as needed for sleep.      No current facility-administered medications for this visit.    Allergies:  Patient has no known allergies.   Social History: The patient  reports that she quit smoking about 9 years ago. Her smoking use included Cigarettes. She has a 15.00 pack-year smoking history. She has never used smokeless tobacco. She reports that she drinks alcohol. She reports that she does not use drugs.   ROS:  Please see the history of present illness. Otherwise, complete review of systems is positive for none.  All other systems are reviewed and negative.   Physical Exam: VS:  BP 102/60   Pulse 72   Ht _0  (1.626  m)   Wt 167 lb (75.8 kg)   SpO2 95%   BMI 28.67 kg/m , BMI Body mass index is 28.67 kg/m.  Wt Readings from Last 3 Encounters:  12/30/15 167 lb (75.8 kg)  12/27/15 170 lb (77.1 kg)  12/16/15 171 lb 12.8 oz (77.9 kg)    Appears comfortable at rest.  HEENT: Conjunctiva and lids normal, oropharynx clear.  Neck: Supple, no elevated JVP or carotid bruits, no thyromegaly.  Lungs: Clear to auscultation, nonlabored breathing at rest.  Cardiac: Regular rate and rhythm, crisp prosthetic sound in S1, no S3 or significant systolic murmur, no pericardial rub.  Abdomen: Soft, nontender, bowel sounds present, no guarding or rebound.  Extremities: No pitting edema, distal pulses 2+.  ECG: I personally reviewed the tracing from 12/22/2014 which showed sinus rhythm with incomplete right bundle branch block and nonspecific T-wave changes.  Recent Labwork: 10/18/2015: ALT 20; AST 24; BUN 11; Creatinine, Ser 0.79; Hemoglobin 12.4; Platelets 218; Potassium 3.8; Sodium 139 12/07/2015: TSH 1.93   Other Studies Reviewed Today:  Echocardiogram 11/07/2015: Study Conclusions  - Left ventricle: The cavity size was normal. Wall thickness was   normal. Systolic function was normal. The estimated ejection   fraction was in the range of 55% to 60%. Limited evaluation of   diastolic function in setting of mechanical MV. Wall motion was   normal; there were no regional wall motion abnormalities. - Aortic valve: Valve area (VTI): 2.13 cm^2. - Mitral valve: Mean gradient (D): 4 mm Hg. - Left atrium: The atrium was moderately dilated. - Right ventricle: The cavity size was mildly dilated. - Technically adequate study.  Assessment and Plan:  1. 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 Will continue follow-up in the anticoagulation clinic. Recent echocardiogram in September reviewed.  2. History of breast cancer, following in the oncology clinic.  Current medicines  were reviewed with the patient today.  Disposition: Follow-up in 6 months.  Signed, Satira Sark, MD, [facility] 12/30/2015 10:52 AM    [facility] [address] [place], [place] [address] Phone: [phone]; Fax: [phone]

## 2015-12-30 ENCOUNTER — Ambulatory Visit (INDEPENDENT_AMBULATORY_CARE_PROVIDER_SITE_OTHER): Payer: BLUE CROSS/BLUE SHIELD | Admitting: Cardiology

## 2015-12-30 ENCOUNTER — Encounter: Payer: Self-pay | Admitting: Cardiology

## 2015-12-30 VITALS — BP 102/60 | HR 72 | Ht 64.0 in | Wt 167.0 lb

## 2015-12-30 DIAGNOSIS — Z952 Presence of prosthetic heart valve: Secondary | ICD-10-CM | POA: Diagnosis not present

## 2015-12-30 NOTE — Patient Instructions (Signed)
Your physician wants you to follow-up in: 6 months Dr McDowell You will receive a reminder letter in the mail two months in advance. If you don't receive a letter, please call our office to schedule the follow-up appointment.  Your physician recommends that you continue on your current medications as directed. Please refer to the Current Medication list given to you today.    If you need a refill on your cardiac medications before your next appointment, please call your pharmacy.       Thank you for choosing [facility] !         

## 2016-01-06 DIAGNOSIS — G894 Chronic pain syndrome: Secondary | ICD-10-CM | POA: Diagnosis not present

## 2016-01-06 DIAGNOSIS — Z23 Encounter for immunization: Secondary | ICD-10-CM | POA: Diagnosis not present

## 2016-01-06 DIAGNOSIS — Z1389 Encounter for screening for other disorder: Secondary | ICD-10-CM | POA: Diagnosis not present

## 2016-01-06 DIAGNOSIS — Z6828 Body mass index (BMI) 28.0-28.9, adult: Secondary | ICD-10-CM | POA: Diagnosis not present

## 2016-01-06 DIAGNOSIS — F419 Anxiety disorder, unspecified: Secondary | ICD-10-CM | POA: Diagnosis not present

## 2016-01-14 ENCOUNTER — Other Ambulatory Visit: Payer: Self-pay | Admitting: Cardiology

## 2016-01-18 ENCOUNTER — Ambulatory Visit (INDEPENDENT_AMBULATORY_CARE_PROVIDER_SITE_OTHER): Payer: BLUE CROSS/BLUE SHIELD | Admitting: *Deleted

## 2016-01-18 DIAGNOSIS — Z5181 Encounter for therapeutic drug level monitoring: Secondary | ICD-10-CM

## 2016-01-18 DIAGNOSIS — Z952 Presence of prosthetic heart valve: Secondary | ICD-10-CM | POA: Diagnosis not present

## 2016-01-18 LAB — POCT INR: INR: 4.1

## 2016-01-19 ENCOUNTER — Encounter: Payer: Self-pay | Admitting: Gastroenterology

## 2016-01-27 ENCOUNTER — Encounter: Payer: Self-pay | Admitting: Gastroenterology

## 2016-02-03 DIAGNOSIS — Z1389 Encounter for screening for other disorder: Secondary | ICD-10-CM | POA: Diagnosis not present

## 2016-02-03 DIAGNOSIS — G894 Chronic pain syndrome: Secondary | ICD-10-CM | POA: Diagnosis not present

## 2016-02-03 DIAGNOSIS — Z6828 Body mass index (BMI) 28.0-28.9, adult: Secondary | ICD-10-CM | POA: Diagnosis not present

## 2016-02-03 DIAGNOSIS — E538 Deficiency of other specified B group vitamins: Secondary | ICD-10-CM | POA: Diagnosis not present

## 2016-02-06 ENCOUNTER — Ambulatory Visit (INDEPENDENT_AMBULATORY_CARE_PROVIDER_SITE_OTHER): Payer: BLUE CROSS/BLUE SHIELD | Admitting: *Deleted

## 2016-02-06 ENCOUNTER — Other Ambulatory Visit: Payer: Self-pay | Admitting: Adult Health

## 2016-02-06 DIAGNOSIS — M0609 Rheumatoid arthritis without rheumatoid factor, multiple sites: Secondary | ICD-10-CM | POA: Diagnosis not present

## 2016-02-06 DIAGNOSIS — M329 Systemic lupus erythematosus, unspecified: Secondary | ICD-10-CM | POA: Diagnosis not present

## 2016-02-06 DIAGNOSIS — M2550 Pain in unspecified joint: Secondary | ICD-10-CM | POA: Diagnosis not present

## 2016-02-06 DIAGNOSIS — Z952 Presence of prosthetic heart valve: Secondary | ICD-10-CM

## 2016-02-06 DIAGNOSIS — Z5181 Encounter for therapeutic drug level monitoring: Secondary | ICD-10-CM

## 2016-02-06 DIAGNOSIS — Z79899 Other long term (current) drug therapy: Secondary | ICD-10-CM | POA: Diagnosis not present

## 2016-02-06 LAB — POCT INR: INR: 2.3

## 2016-02-27 ENCOUNTER — Ambulatory Visit (INDEPENDENT_AMBULATORY_CARE_PROVIDER_SITE_OTHER): Payer: BLUE CROSS/BLUE SHIELD | Admitting: *Deleted

## 2016-02-27 DIAGNOSIS — Z952 Presence of prosthetic heart valve: Secondary | ICD-10-CM

## 2016-02-27 DIAGNOSIS — Z5181 Encounter for therapeutic drug level monitoring: Secondary | ICD-10-CM | POA: Diagnosis not present

## 2016-02-27 LAB — POCT INR: INR: 4.9

## 2016-03-08 ENCOUNTER — Encounter (HOSPITAL_COMMUNITY)
Admission: RE | Admit: 2016-03-08 | Discharge: 2016-03-08 | Disposition: A | Discharge status: Home / Self Care | Payer: BLUE CROSS/BLUE SHIELD | Source: Ambulatory Visit | Attending: Rheumatology | Admitting: Rheumatology

## 2016-03-08 ENCOUNTER — Encounter (HOSPITAL_COMMUNITY): Payer: Self-pay

## 2016-03-08 DIAGNOSIS — M069 Rheumatoid arthritis, unspecified: Secondary | ICD-10-CM | POA: Diagnosis not present

## 2016-03-08 MED ORDER — METHYLPREDNISOLONE SODIUM SUCC 125 MG IJ SOLR
125.0000 mg | Freq: Once | INTRAMUSCULAR | Status: AC
  Administered 2016-03-08: 125 mg via INTRAVENOUS
  Filled 2016-03-08: qty 2

## 2016-03-08 MED ORDER — RITUXIMAB CHEMO INJECTION 500 MG/50ML
1000.0000 mg | Freq: Once | INTRAVENOUS | Status: AC
  Administered 2016-03-08: 1000 mg via INTRAVENOUS
  Filled 2016-03-08: qty 100

## 2016-03-08 MED ORDER — ACETAMINOPHEN 325 MG PO TABS
650.0000 mg | ORAL_TABLET | Freq: Once | ORAL | Status: AC
  Administered 2016-03-08: 650 mg via ORAL
  Filled 2016-03-08: qty 2

## 2016-03-08 MED ORDER — SODIUM CHLORIDE 0.9 % IV SOLN
INTRAVENOUS | Status: DC
  Administered 2016-03-08: 10:00:00 via INTRAVENOUS

## 2016-03-08 MED ORDER — DIPHENHYDRAMINE HCL 50 MG/ML IJ SOLN
25.0000 mg | Freq: Once | INTRAMUSCULAR | Status: AC
Start: 2016-03-08 — End: 2016-03-08
  Administered 2016-03-08: 25 mg via INTRAVENOUS
  Filled 2016-03-08: qty 1

## 2016-03-12 ENCOUNTER — Ambulatory Visit (INDEPENDENT_AMBULATORY_CARE_PROVIDER_SITE_OTHER): Payer: BLUE CROSS/BLUE SHIELD | Admitting: *Deleted

## 2016-03-12 DIAGNOSIS — G4700 Insomnia, unspecified: Secondary | ICD-10-CM | POA: Diagnosis not present

## 2016-03-12 DIAGNOSIS — E538 Deficiency of other specified B group vitamins: Secondary | ICD-10-CM | POA: Diagnosis not present

## 2016-03-12 DIAGNOSIS — Z5181 Encounter for therapeutic drug level monitoring: Secondary | ICD-10-CM | POA: Diagnosis not present

## 2016-03-12 DIAGNOSIS — Z952 Presence of prosthetic heart valve: Secondary | ICD-10-CM | POA: Diagnosis not present

## 2016-03-12 DIAGNOSIS — Z1389 Encounter for screening for other disorder: Secondary | ICD-10-CM | POA: Diagnosis not present

## 2016-03-12 DIAGNOSIS — E663 Overweight: Secondary | ICD-10-CM | POA: Diagnosis not present

## 2016-03-12 DIAGNOSIS — G894 Chronic pain syndrome: Secondary | ICD-10-CM | POA: Diagnosis not present

## 2016-03-12 DIAGNOSIS — Z6829 Body mass index (BMI) 29.0-29.9, adult: Secondary | ICD-10-CM | POA: Diagnosis not present

## 2016-03-12 DIAGNOSIS — F419 Anxiety disorder, unspecified: Secondary | ICD-10-CM | POA: Diagnosis not present

## 2016-03-12 LAB — POCT INR: INR: 6

## 2016-03-19 ENCOUNTER — Ambulatory Visit (INDEPENDENT_AMBULATORY_CARE_PROVIDER_SITE_OTHER): Payer: BLUE CROSS/BLUE SHIELD | Admitting: *Deleted

## 2016-03-19 DIAGNOSIS — Z5181 Encounter for therapeutic drug level monitoring: Secondary | ICD-10-CM | POA: Diagnosis not present

## 2016-03-19 DIAGNOSIS — Z952 Presence of prosthetic heart valve: Secondary | ICD-10-CM

## 2016-03-19 LAB — POCT INR: INR: 2

## 2016-03-22 ENCOUNTER — Encounter (HOSPITAL_COMMUNITY)
Admission: RE | Admit: 2016-03-22 | Discharge: 2016-03-22 | Disposition: A | Discharge status: Home / Self Care | Payer: BLUE CROSS/BLUE SHIELD | Source: Ambulatory Visit | Attending: Rheumatology | Admitting: Rheumatology

## 2016-03-22 DIAGNOSIS — M069 Rheumatoid arthritis, unspecified: Secondary | ICD-10-CM | POA: Diagnosis not present

## 2016-03-22 MED ORDER — ACETAMINOPHEN 325 MG PO TABS
650.0000 mg | ORAL_TABLET | Freq: Once | ORAL | Status: AC
  Administered 2016-03-22: 650 mg via ORAL
  Filled 2016-03-22: qty 2

## 2016-03-22 MED ORDER — METHYLPREDNISOLONE SODIUM SUCC 125 MG IJ SOLR
125.0000 mg | Freq: Once | INTRAMUSCULAR | Status: AC
  Administered 2016-03-22: 125 mg via INTRAVENOUS
  Filled 2016-03-22: qty 2

## 2016-03-22 MED ORDER — SODIUM CHLORIDE 0.9 % IV SOLN
1000.0000 mg | Freq: Once | INTRAVENOUS | Status: DC
  Filled 2016-03-22: qty 100

## 2016-03-22 MED ORDER — SODIUM CHLORIDE 0.9 % IV SOLN: INTRAVENOUS | Status: DC

## 2016-03-22 MED ORDER — DIPHENHYDRAMINE HCL 50 MG/ML IJ SOLN
25.0000 mg | Freq: Once | INTRAMUSCULAR | Status: AC
  Administered 2016-03-22: 25 mg via INTRAVENOUS
  Filled 2016-03-22: qty 1

## 2016-03-22 MED ORDER — RITUXIMAB CHEMO INJECTION 500 MG/50ML
1000.0000 mg | Freq: Once | INTRAVENOUS | Status: AC
  Administered 2016-03-22: 1000 mg via INTRAVENOUS
  Filled 2016-03-22: qty 100

## 2016-03-22 NOTE — Progress Notes (Addendum)
Upon administering Benadryl and solumedrol IV, Pt complained of burning sensation. The IV site looked fine but lateral and more proximal to the site was noted to have some patchy redness along with itching. Renato Battles, Pharmacist was consulted. His recommendation was to change IV sites. Attempted to change the IV site without success. Infusion started at original site without problem.The redness, itching and pain had gone away. Pt instructed to call if she had any discomfort, itching or concern.

## 2016-03-28 ENCOUNTER — Ambulatory Visit (INDEPENDENT_AMBULATORY_CARE_PROVIDER_SITE_OTHER): Payer: BLUE CROSS/BLUE SHIELD | Admitting: *Deleted

## 2016-03-28 DIAGNOSIS — Z952 Presence of prosthetic heart valve: Secondary | ICD-10-CM | POA: Diagnosis not present

## 2016-03-28 DIAGNOSIS — Z5181 Encounter for therapeutic drug level monitoring: Secondary | ICD-10-CM

## 2016-03-28 LAB — POCT INR: INR: 3.8

## 2016-04-04 DIAGNOSIS — Z1389 Encounter for screening for other disorder: Secondary | ICD-10-CM | POA: Diagnosis not present

## 2016-04-04 DIAGNOSIS — E538 Deficiency of other specified B group vitamins: Secondary | ICD-10-CM | POA: Diagnosis not present

## 2016-04-04 DIAGNOSIS — Z6829 Body mass index (BMI) 29.0-29.9, adult: Secondary | ICD-10-CM | POA: Diagnosis not present

## 2016-04-04 DIAGNOSIS — G894 Chronic pain syndrome: Secondary | ICD-10-CM | POA: Diagnosis not present

## 2016-04-04 DIAGNOSIS — E663 Overweight: Secondary | ICD-10-CM | POA: Diagnosis not present

## 2016-04-09 DIAGNOSIS — H35371 Puckering of macula, right eye: Secondary | ICD-10-CM | POA: Diagnosis not present

## 2016-04-09 DIAGNOSIS — Q141 Congenital malformation of retina: Secondary | ICD-10-CM | POA: Diagnosis not present

## 2016-04-09 DIAGNOSIS — H35423 Microcystoid degeneration of retina, bilateral: Secondary | ICD-10-CM | POA: Diagnosis not present

## 2016-04-09 DIAGNOSIS — H43811 Vitreous degeneration, right eye: Secondary | ICD-10-CM | POA: Diagnosis not present

## 2016-04-11 ENCOUNTER — Ambulatory Visit (INDEPENDENT_AMBULATORY_CARE_PROVIDER_SITE_OTHER): Payer: BLUE CROSS/BLUE SHIELD | Admitting: *Deleted

## 2016-04-11 DIAGNOSIS — Z9013 Acquired absence of bilateral breasts and nipples: Secondary | ICD-10-CM | POA: Diagnosis not present

## 2016-04-11 DIAGNOSIS — Z952 Presence of prosthetic heart valve: Secondary | ICD-10-CM | POA: Diagnosis not present

## 2016-04-11 DIAGNOSIS — Z5181 Encounter for therapeutic drug level monitoring: Secondary | ICD-10-CM

## 2016-04-11 DIAGNOSIS — Z853 Personal history of malignant neoplasm of breast: Secondary | ICD-10-CM | POA: Diagnosis not present

## 2016-04-11 LAB — POCT INR: INR: 3.1

## 2016-04-18 ENCOUNTER — Encounter (HOSPITAL_COMMUNITY): Payer: BLUE CROSS/BLUE SHIELD

## 2016-04-18 ENCOUNTER — Other Ambulatory Visit (HOSPITAL_COMMUNITY): Payer: Self-pay | Admitting: Adult Health

## 2016-04-18 ENCOUNTER — Encounter (HOSPITAL_COMMUNITY): Payer: Self-pay | Admitting: Adult Health

## 2016-04-18 ENCOUNTER — Encounter (HOSPITAL_COMMUNITY): Payer: BLUE CROSS/BLUE SHIELD | Attending: Oncology | Admitting: Adult Health

## 2016-04-18 VITALS — BP 125/77 | HR 75 | Temp 98.1°F | Resp 16 | Ht 64.0 in | Wt 174.0 lb

## 2016-04-18 DIAGNOSIS — Z79899 Other long term (current) drug therapy: Secondary | ICD-10-CM | POA: Diagnosis not present

## 2016-04-18 DIAGNOSIS — F5105 Insomnia due to other mental disorder: Secondary | ICD-10-CM | POA: Insufficient documentation

## 2016-04-18 DIAGNOSIS — M329 Systemic lupus erythematosus, unspecified: Secondary | ICD-10-CM | POA: Diagnosis not present

## 2016-04-18 DIAGNOSIS — D591 Other autoimmune hemolytic anemias: Secondary | ICD-10-CM | POA: Insufficient documentation

## 2016-04-18 DIAGNOSIS — Z1231 Encounter for screening mammogram for malignant neoplasm of breast: Secondary | ICD-10-CM

## 2016-04-18 DIAGNOSIS — R740 Nonspecific elevation of levels of transaminase and lactic acid dehydrogenase [LDH]: Secondary | ICD-10-CM | POA: Diagnosis not present

## 2016-04-18 DIAGNOSIS — M542 Cervicalgia: Secondary | ICD-10-CM | POA: Diagnosis not present

## 2016-04-18 DIAGNOSIS — G8929 Other chronic pain: Secondary | ICD-10-CM | POA: Diagnosis not present

## 2016-04-18 DIAGNOSIS — F419 Anxiety disorder, unspecified: Secondary | ICD-10-CM | POA: Diagnosis not present

## 2016-04-18 DIAGNOSIS — D649 Anemia, unspecified: Secondary | ICD-10-CM | POA: Insufficient documentation

## 2016-04-18 DIAGNOSIS — K3184 Gastroparesis: Secondary | ICD-10-CM | POA: Diagnosis not present

## 2016-04-18 DIAGNOSIS — Z87891 Personal history of nicotine dependence: Secondary | ICD-10-CM | POA: Diagnosis not present

## 2016-04-18 DIAGNOSIS — C50912 Malignant neoplasm of unspecified site of left female breast: Secondary | ICD-10-CM | POA: Diagnosis not present

## 2016-04-18 DIAGNOSIS — Z9221 Personal history of antineoplastic chemotherapy: Secondary | ICD-10-CM | POA: Diagnosis not present

## 2016-04-18 DIAGNOSIS — M549 Dorsalgia, unspecified: Secondary | ICD-10-CM | POA: Diagnosis not present

## 2016-04-18 DIAGNOSIS — Z7901 Long term (current) use of anticoagulants: Secondary | ICD-10-CM | POA: Insufficient documentation

## 2016-04-18 DIAGNOSIS — C50419 Malignant neoplasm of upper-outer quadrant of unspecified female breast: Secondary | ICD-10-CM

## 2016-04-18 DIAGNOSIS — C50412 Malignant neoplasm of upper-outer quadrant of left female breast: Secondary | ICD-10-CM | POA: Diagnosis not present

## 2016-04-18 LAB — COMPREHENSIVE METABOLIC PANEL
ALBUMIN: 4.4 g/dL (ref 3.5–5.0)
ALK PHOS: 79 U/L (ref 38–126)
ALT: 22 U/L (ref 14–54)
AST: 25 U/L (ref 15–41)
Anion gap: 8 (ref 5–15)
BILIRUBIN TOTAL: 0.5 mg/dL (ref 0.3–1.2)
BUN: 15 mg/dL (ref 6–20)
CALCIUM: 9.6 mg/dL (ref 8.9–10.3)
CO2: 26 mmol/L (ref 22–32)
Chloride: 106 mmol/L (ref 101–111)
Creatinine, Ser: 0.68 mg/dL (ref 0.44–1.00)
GFR calc Af Amer: >60 mL/min (ref >60)
GFR calc non Af Amer: >60 mL/min (ref >60)
GLUCOSE: 83 mg/dL (ref 65–99)
Potassium: 4.1 mmol/L (ref 3.5–5.1)
Sodium: 140 mmol/L (ref 135–145)
TOTAL PROTEIN: 8.2 g/dL — AB (ref 6.5–8.1)

## 2016-04-18 LAB — CBC WITH DIFFERENTIAL/PLATELET
BASOS ABS: 0 10*3/uL (ref 0.0–0.1)
BASOS PCT: 1 %
Eosinophils Absolute: 0.3 10*3/uL (ref 0.0–0.7)
Eosinophils Relative: 4 %
HEMATOCRIT: 38.5 % (ref 36.0–46.0)
HEMOGLOBIN: 13 g/dL (ref 12.0–15.0)
Lymphocytes Relative: 15 %
Lymphs Abs: 1 10*3/uL (ref 0.7–4.0)
MCH: 29.4 pg (ref 26.0–34.0)
MCHC: 33.8 g/dL (ref 30.0–36.0)
MCV: 87.1 fL (ref 78.0–100.0)
Monocytes Absolute: 0.5 10*3/uL (ref 0.1–1.0)
Monocytes Relative: 7 %
NEUTROS ABS: 4.9 10*3/uL (ref 1.7–7.7)
NEUTROS PCT: 73 %
Platelets: 212 10*3/uL (ref 150–400)
RBC: 4.42 MIL/uL (ref 3.87–5.11)
RDW: 14.9 % (ref 11.5–15.5)
WBC: 6.6 10*3/uL (ref 4.0–10.5)

## 2016-04-18 LAB — LACTATE DEHYDROGENASE: LDH: 298 U/L — ABNORMAL HIGH (ref 98–192)

## 2016-04-18 MED ORDER — TEMAZEPAM 30 MG PO CAPS: 30.0000 mg | ORAL_CAPSULE | Freq: Every evening | ORAL | 0 refills | Status: DC | PRN

## 2016-04-18 NOTE — Progress Notes (Signed)
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  Triple negative with Ki-67 of 92%.      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   INTERVAL HISTORY:  [name] 46 y.o. female presents for routine follow-up for breast cancer and hemolytic anemia, thought to be secondary to lupus. She maintains close follow-up with her rheumatologist and other specialists.  We have a copy of recent lab report from rheumatologist's office; patient with concerns regarding lab values and wants to review these today.    She recently underwent breast reconstruction for her left breast and right breast mammoplasty with Dr. Lavone Neri in [facility] with [facility]. She is happy with the cosmesis of her breasts.  She has healed well.  [name] has not been sleeping well; this has  been a chronic problem for her. She has been taking Ambien with Xanax, but this is not consistently effective.  She struggles with daytime fatigue as a result. Her appetite is good; she sees GI for gastroparesis; she has not had any concerns lately.   She tolerates Rituxan well for her lupus; Plaquenil was stopped d/t to retinal changes.   Endorses chronic back and neck pain; she sees [facility] for this and gets periodic injections.  Otherwise, she is largely without complaints today.    Her daughter works in the [employer] here at [employer] as a Chartered certified accountant; she is graduating from nursing school with her RN in May 2018. 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     REVIEW OF SYSTEMS:  Review of Systems  Constitutional: Positive for fatigue. Negative for chills and fever.  HENT:  Negative.   Eyes: Negative.   Respiratory: Negative.  Negative for cough and shortness of breath.   Cardiovascular: Negative.  Negative for chest pain and palpitations.  Gastrointestinal: Negative.  Negative for constipation, diarrhea, nausea and vomiting.  Endocrine: Negative.   Genitourinary: Negative.  Negative for dysuria, hematuria and vaginal bleeding (h/o ablation ).   Musculoskeletal: Positive for back pain and neck pain.  Skin: Negative.   Neurological: Positive for headaches (chronic headaches; not new issue for her). Negative for dizziness.  Hematological: Negative.   Psychiatric/Behavioral: Positive for sleep disturbance. The patient is nervous/anxious.  PAST MEDICAL/SURGICAL HISTORY:  Past Medical History:  Diagnosis Date  . Abnormal Papanicolaou smear of cervix with positive human papilloma virus (HPV) test 08/17/2015  . Anticoagulation goal of INR 2.5 to 3.5   . Anxiety   . B12 deficiency   . BRCA1 negative   . BRCA2 negative   . Breast cancer ([place]) 2011   Left side  . Bursitis of right knee    Septic bursitis  . Drug-induced hepatitis    States  per her rheumatologist, transaminases elevated but normalized after drug removed for   . Fibromyalgia   . 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 History of abnormal cervical Pap smear 08/12/2015  . Mitral valve disease, rheumatic    St. Jude prosthesis  . Peripheral neuropathy ([place])   . Pneumonia   . Raynaud disease   . Raynaud disease   . SLE (systemic lupus erythematosus) ([place])    Past Surgical History:  Procedure Laterality Date  . BREAST SURGERY    . CHOLECYSTECTOMY    . ENDOMETRIAL ABLATION  2008   She no longer has menses  . ESOPHAGOGASTRODUODENOSCOPY N/A 03/25/2013   Dr. Raliegh Scarlet reflux esophagitis-likely source of patient's symptoms (patulous EG junction). Hiatal hernia otherwise normal  . Insertion expander left breast  11/16/10  . MASTECTOMY    . MASTOPEXY  02/14/2011   Procedure: MASTOPEXY;  Surgeon: Macon Large;  Location: [facility];  Service: Plastics;  Laterality: Bilateral;  Right Breast Reduction   . MITRAL VALVE REPLACEMENT    . TISSUE EXPANDER PLACEMENT  02/14/2011   Procedure: TISSUE EXPANDER;  Surgeon: Macon Large;  Location: [facility];  Service: Plastics;  Laterality: Bilateral;  Left Breast Remove Tissue Expander Placement of Implant Breast Reconstruction  . TUBAL LIGATION       SOCIAL HISTORY:  Social History   Social History  . Marital status: Married    Spouse name: N/A  . Number of children: 2  . Years of education: N/A   Occupational History  . Not on file.   Social History Main Topics  . Smoking status: Former Smoker    Packs/day: 1.00    Years: 15.00    Types: Cigarettes    Quit date: 02/19/2006  . Smokeless tobacco: Never Used     Comment: Quit x 10 years  . Alcohol use 0.0 oz/week     Comment: Occasional  . Drug use: No  . Sexual activity: Not Currently    Birth control/ protection: Surgical     Comment: tubal and ablation   Other Topics Concern  . Not on file   Social History Narrative  . No  narrative on file    FAMILY HISTORY:  Family History  Problem Relation Age of Onset  . Hypertension Mother   . Hyperlipidemia Mother   . Hypertension Father   . Colon cancer Neg Hx     CURRENT MEDICATIONS:  Outpatient Encounter Prescriptions as of 04/18/2016  Medication Sig Note  . Albuterol (VENTOLIN IN) Inhale into the lungs as needed.   . ALPRAZolam (XANAX) 0.5 MG tablet Take 0.5 mg by mouth at bedtime.  11/23/2013: Received from: External Pharmacy  . cyanocobalamin (,VITAMIN B-12,) 1000 MCG/ML injection Inject 1 mL (1,000 mcg total) into the muscle every 30 (thirty) days.   Marland Kitchen dexlansoprazole (DEXILANT) 60 MG capsule Take 1 capsule (60 mg total) by mouth daily.   . furosemide (LASIX) 40 MG tablet TAKE ONE TABLET BY MOUTH ONCE DAILY.   Marland Kitchen  gabapentin (NEURONTIN) 600 MG tablet Take 600 mg by mouth 2 (two) times daily.    Marland Kitchen ibuprofen (ADVIL,MOTRIN) 800 MG tablet Take 800 mg by mouth every 6 (six) hours as needed. Reported on 08/30/2015 08/12/2015: Received from: [facility]  . levothyroxine (SYNTHROID, LEVOTHROID) 100 MCG tablet Take 1 tablet (100 mcg total) by mouth daily.   . metoCLOPramide (REGLAN) 5 MG tablet Take 5 mg by mouth 2 (two) times daily.   Marland Kitchen oxyCODONE-acetaminophen (PERCOCET) 7.5-325 MG per tablet Take 1 tablet by mouth every 4 (four) hours as needed for pain. Reported on 08/30/2015   . potassium chloride (KLOR-CON 10) 10 MEQ tablet Take one tablet daily as directed 11/23/2013: Received from: [facility]  . riTUXimab (RITUXAN) 100 MG/10ML injection Inject into the vein. Every 6 months 04/20/2015: Last dose 11/2014 at [facility]  . rOPINIRole (REQUIP) 0.5 MG tablet Take 1 mg by mouth at bedtime.    Marland Kitchen warfarin (COUMADIN) 5 MG tablet Take 2 tablets daily except 1 1/2 tablets on Mondays, Wednesdays and Fridays   . zolpidem (AMBIEN) 10 MG tablet Take 10 mg by mouth at bedtime as needed for sleep.    . temazepam (RESTORIL) 30 MG capsule Take 1 capsule (30 mg total) by mouth at bedtime as  needed for sleep.    No facility-administered encounter medications on file as of 04/18/2016.     ALLERGIES:  No Known Allergies   PHYSICAL EXAM:  ECOG Performance status: 1 - Symptomatic, but independent.   Vitals:   04/18/16 1307  BP: 125/77  Pulse: 75  Resp: 16  Temp: 98.1 F (36.7 C)   Filed Weights   04/18/16 1307  Weight: 174 lb (78.9 kg)    Physical Exam  Constitutional: She is oriented to person, place, and time and well-developed, well-nourished, and in no distress.  HENT:  Head: Normocephalic.  Mouth/Throat: Oropharynx is clear and moist. No oropharyngeal exudate.  Eyes: Conjunctivae are normal. Pupils are equal, round, and reactive to light. No scleral icterus.  Neck: Normal range of motion. Neck supple.  Cardiovascular: Normal rate and regular rhythm.   Prosthetic heart valve noted on exam  Pulmonary/Chest: Effort normal and breath sounds normal. No respiratory distress. She has no wheezes.    Abdominal: Soft. Bowel sounds are normal. There is no tenderness.  Musculoskeletal: Normal range of motion. She exhibits no edema.  Lymphadenopathy:    She has no cervical adenopathy.       Right: No supraclavicular adenopathy present.       Left: No supraclavicular adenopathy present.  Neurological: She is alert and oriented to person, place, and time. No cranial nerve deficit. Gait normal.  Skin: Skin is warm and dry.  Psychiatric: Mood, memory, affect and judgment normal.  Nursing note and vitals reviewed.    LABORATORY DATA:  I have reviewed the labs as listed.  CBC    Component Value Date/Time   WBC 6.6 04/18/2016 1235   RBC 4.42 04/18/2016 1235   HGB 13.0 04/18/2016 1235   HCT 38.5 04/18/2016 1235   PLT 212 04/18/2016 1235   MCV 87.1 04/18/2016 1235   MCH 29.4 04/18/2016 1235   MCHC 33.8 04/18/2016 1235   RDW 14.9 04/18/2016 1235   LYMPHSABS 1.0 04/18/2016 1235   MONOABS 0.5 04/18/2016 1235   EOSABS 0.3 04/18/2016 1235   BASOSABS 0.0 04/18/2016  1235   CMP Latest Ref Rng & Units 04/18/2016 10/18/2015 06/22/2013  Glucose 65 - 99 mg/dL 83 84 82  BUN 6 -  20 mg/dL _0 Creatinine 0.44 - 1.00 mg/dL 0.68 0.79 0.77  Sodium 135 - 145 mmol/L 140 139 140  Potassium 3.5 - 5.1 mmol/L 4.1 3.8 3.4(L)  Chloride 101 - 111 mmol/L 106 107 103  CO2 22 - 32 mmol/L _1 Calcium 8.9 - 10.3 mg/dL 9.6 8.9 9.1  Total Protein 6.5 - 8.1 g/dL 8.2(H) 7.7 7.2  Total Bilirubin 0.3 - 1.2 mg/dL 0.5 0.5 0.3  Alkaline Phos 38 - 126 U/L 79 66 65  AST 15 - 41 U/L _2 ALT 14 - 54 U/L _3 Results for [name], [name] (MRN [id number])   Ref. Range 04/18/2016 12:35  LDH Latest Ref Range: 98 - 192 U/L 298 (H)    PENDING LABS:  Reticulocyte count     DIAGNOSTIC IMAGING:  Right breast mammogram: 03/23/15    PATHOLOGY:  Left mastectomy surgical path: 04/20/10     ASSESSMENT & PLAN:   Stage IIA left breast invasive ductal carcinoma, ER-/PR-/HER2-: -s/p neoadjuvant chemotherapy with FEC x 2 cycles, followed by left mastectomy, then adjuvant TC chemotherapy x 4 cycles; completed chemotherapy on 08/28/10.  -s/p left breast reconstruction and right breast mammoplasty x 2; latest procedure done in 12/2015.  -She is due for right breast screening mammogram; orders placed today.  Clinical breast exam benign today.  -From a breast cancer standpoint, we could go to annual visits, but given her periodic anemia we will continue to see her every 6 months.   Hemolytic anemia:  -Anemia thought to be secondary to lupus.  -Labs reviewed from rheumatologist; hemoglobin noted to be 10.8 g/dL with hematocrit 33.1; labs collected on 02/06/16. (copy of records sent to HIM to be scanned into patient record).  -Hemoglobin normal today at 13 g/dL. We will continue to monitor. Encouraged her to call us if she feels like she needs labs checked before her next visit and we could see her sooner, if needed.  -Return to cancer center in 6 months with labs.   Lupus:    -Elevated C4 complement on recent labs in 01/2016. -Elevated LDH today, likely secondary to lupus.   -Maintain follow-up with rheumatology, as directed.   Gastroparesis:  -Maintain follow-up with GI, as directed.   Insomnia secondary to anxiety:  -Endorses having trouble sleeping d/t to anxiety/"mind racing" at times.  Has been taking Ambien with Xanax, which is no longer effective consistently.  -Discussed the option of trying Temazepam at bedtime.  She agreed to give this a try; instructed her not to take the Temazepam with the Xanax. Also instructed her not to take the Temazepam with the Ambien until she is able to see how well she tolerates the temazepam alone, to avoid oversedation. She agreed with this plan.   -Prescription for Temazepam 30 mg QHSprn, #30, no refills given to patient.     Dispo:  -Return to cancer center in 6 months for continued follow-up.    All questions were answered to patient's stated satisfaction. Encouraged patient to call with any new concerns or questions before her next visit to the cancer center and we can certain see her sooner, if needed.    Plan of care discussed with Dr. Talbert Cage, who agrees with the above aforementioned.    Orders placed this encounter:  Orders Placed This Encounter  Procedures  . MM SCREENING BREAST TOMO UNI R  . CBC with Differential/Platelet  . Comprehensive metabolic panel  . Lactate  dehydrogenase  . [place], NP [place] [phone]

## 2016-04-18 NOTE — Patient Instructions (Addendum)
[place] at [facility] Discharge Instructions  RECOMMENDATIONS MADE BY THE CONSULTANT AND ANY TEST RESULTS WILL BE SENT TO YOUR REFERRING PHYSICIAN.  You were seen today by Mike Craze NP. Mammogram soon. Return in 6 months for follow up and labs.    Thank you for choosing [place] at [facility] to provide your oncology and hematology care.  To afford each patient quality time with our provider, please arrive at least 15 minutes before your scheduled appointment time.    If you have a lab appointment with the [facility] please come in thru the  Main Entrance and check in at the main information desk  You need to re-schedule your appointment should you arrive 10 or more minutes late.  We strive to give you quality time with our providers, and arriving late affects you and other patients whose appointments are after yours.  Also, if you no show three or more times for appointments you may be dismissed from the clinic at the providers discretion.     Again, thank you for choosing [facility]  Our hope is that these requests will decrease the amount of time that you wait before being seen by our physicians.       _____________________________________________________________  Should you have questions after your visit to [facility], please contact our office at [phone] between the hours of 8:30 a.m. and 4:30 p.m.  Voicemails left after 4:30 p.m. will not be returned until the following business day.  For prescription refill requests, have your pharmacy contact our office.       Resources For Cancer Patients and their Caregivers ? American Cancer Society: Can assist with transportation, wigs, general needs, runs Look Good Feel Better.        786-267-8763 ? Cancer Care: Provides financial assistance, online support groups, medication/co-pay assistance.  1-800-813-HOPE 618-230-0820) ? [facility] Assists [place] cancer patients and their families through emotional , educational and financial support.  (850) 214-7108 ? [place] DSS Where to apply for food stamps, Medicaid and utility assistance. 785-305-6937 ? [facility]: Transportation to medical appointments. (213)352-0849 ? Social Security Administration: May apply for disability if have a Stage IV cancer. (226)221-2421 4033087007 ? [employer], Disability and Transit Services: Assists with nutrition, care and transit needs. [facility] [place] Support Programs: @10RELATIVEDAYS @ > Cancer Support Group  2nd Tuesday of the month 1pm-2pm, Journey Room  > Creative Journey  3rd Tuesday of the month 1130am-1pm, Journey Room  > Look Good Feel Better  1st Wednesday of the month 10am-12 noon, Journey Room (Call [place] to register [phone])

## 2016-04-19 ENCOUNTER — Encounter: Payer: Self-pay | Admitting: Adult Health

## 2016-04-19 NOTE — Progress Notes (Signed)
Received PA request for Temazepam to [facility]. Scanned to [place] D.

## 2016-04-27 ENCOUNTER — Ambulatory Visit (HOSPITAL_COMMUNITY): Payer: BLUE CROSS/BLUE SHIELD

## 2016-04-27 DIAGNOSIS — E538 Deficiency of other specified B group vitamins: Secondary | ICD-10-CM | POA: Diagnosis not present

## 2016-04-27 DIAGNOSIS — Z1389 Encounter for screening for other disorder: Secondary | ICD-10-CM | POA: Diagnosis not present

## 2016-04-27 DIAGNOSIS — Z6829 Body mass index (BMI) 29.0-29.9, adult: Secondary | ICD-10-CM | POA: Diagnosis not present

## 2016-04-27 DIAGNOSIS — G894 Chronic pain syndrome: Secondary | ICD-10-CM | POA: Diagnosis not present

## 2016-05-15 DIAGNOSIS — R0602 Shortness of breath: Secondary | ICD-10-CM | POA: Diagnosis not present

## 2016-05-15 DIAGNOSIS — M329 Systemic lupus erythematosus, unspecified: Secondary | ICD-10-CM | POA: Diagnosis not present

## 2016-05-15 DIAGNOSIS — G609 Hereditary and idiopathic neuropathy, unspecified: Secondary | ICD-10-CM | POA: Diagnosis not present

## 2016-05-15 DIAGNOSIS — R942 Abnormal results of pulmonary function studies: Secondary | ICD-10-CM | POA: Diagnosis not present

## 2016-05-16 ENCOUNTER — Ambulatory Visit (INDEPENDENT_AMBULATORY_CARE_PROVIDER_SITE_OTHER): Payer: BLUE CROSS/BLUE SHIELD | Admitting: *Deleted

## 2016-05-16 ENCOUNTER — Other Ambulatory Visit (HOSPITAL_COMMUNITY): Payer: Self-pay | Admitting: Respiratory Therapy

## 2016-05-16 DIAGNOSIS — R0602 Shortness of breath: Secondary | ICD-10-CM

## 2016-05-16 DIAGNOSIS — Z5181 Encounter for therapeutic drug level monitoring: Secondary | ICD-10-CM | POA: Diagnosis not present

## 2016-05-16 DIAGNOSIS — Z952 Presence of prosthetic heart valve: Secondary | ICD-10-CM

## 2016-05-16 LAB — POCT INR: INR: 2.9

## 2016-05-21 ENCOUNTER — Inpatient Hospital Stay (HOSPITAL_COMMUNITY)
Admission: EM | Admit: 2016-05-21 | Discharge: 2016-05-30 | DRG: 853 | Disposition: A | Discharge status: Home / Self Care | Payer: BLUE CROSS/BLUE SHIELD | Attending: Internal Medicine | Admitting: Internal Medicine

## 2016-05-21 ENCOUNTER — Emergency Department (HOSPITAL_COMMUNITY): Payer: BLUE CROSS/BLUE SHIELD

## 2016-05-21 ENCOUNTER — Encounter (HOSPITAL_COMMUNITY): Payer: Self-pay | Admitting: Emergency Medicine

## 2016-05-21 DIAGNOSIS — Z7901 Long term (current) use of anticoagulants: Secondary | ICD-10-CM

## 2016-05-21 DIAGNOSIS — K3580 Unspecified acute appendicitis: Secondary | ICD-10-CM | POA: Diagnosis not present

## 2016-05-21 DIAGNOSIS — I7300 Raynaud's syndrome without gangrene: Secondary | ICD-10-CM | POA: Diagnosis present

## 2016-05-21 DIAGNOSIS — Z79899 Other long term (current) drug therapy: Secondary | ICD-10-CM

## 2016-05-21 DIAGNOSIS — M329 Systemic lupus erythematosus, unspecified: Secondary | ICD-10-CM | POA: Diagnosis present

## 2016-05-21 DIAGNOSIS — E860 Dehydration: Secondary | ICD-10-CM | POA: Diagnosis present

## 2016-05-21 DIAGNOSIS — Z952 Presence of prosthetic heart valve: Secondary | ICD-10-CM

## 2016-05-21 DIAGNOSIS — R6520 Severe sepsis without septic shock: Secondary | ICD-10-CM | POA: Diagnosis present

## 2016-05-21 DIAGNOSIS — Z853 Personal history of malignant neoplasm of breast: Secondary | ICD-10-CM

## 2016-05-21 DIAGNOSIS — G629 Polyneuropathy, unspecified: Secondary | ICD-10-CM | POA: Diagnosis present

## 2016-05-21 DIAGNOSIS — F411 Generalized anxiety disorder: Secondary | ICD-10-CM | POA: Diagnosis present

## 2016-05-21 DIAGNOSIS — K352 Acute appendicitis with generalized peritonitis: Secondary | ICD-10-CM | POA: Diagnosis not present

## 2016-05-21 DIAGNOSIS — M328 Other forms of systemic lupus erythematosus: Secondary | ICD-10-CM | POA: Diagnosis not present

## 2016-05-21 DIAGNOSIS — L0291 Cutaneous abscess, unspecified: Secondary | ICD-10-CM

## 2016-05-21 DIAGNOSIS — I5032 Chronic diastolic (congestive) heart failure: Secondary | ICD-10-CM | POA: Diagnosis present

## 2016-05-21 DIAGNOSIS — B370 Candidal stomatitis: Secondary | ICD-10-CM | POA: Diagnosis not present

## 2016-05-21 DIAGNOSIS — G43A0 Cyclical vomiting, not intractable: Secondary | ICD-10-CM | POA: Diagnosis not present

## 2016-05-21 DIAGNOSIS — M069 Rheumatoid arthritis, unspecified: Secondary | ICD-10-CM | POA: Diagnosis present

## 2016-05-21 DIAGNOSIS — K30 Functional dyspepsia: Secondary | ICD-10-CM | POA: Diagnosis not present

## 2016-05-21 DIAGNOSIS — E869 Volume depletion, unspecified: Secondary | ICD-10-CM | POA: Diagnosis not present

## 2016-05-21 DIAGNOSIS — A419 Sepsis, unspecified organism: Secondary | ICD-10-CM | POA: Diagnosis not present

## 2016-05-21 DIAGNOSIS — R197 Diarrhea, unspecified: Secondary | ICD-10-CM | POA: Diagnosis not present

## 2016-05-21 DIAGNOSIS — K754 Autoimmune hepatitis: Secondary | ICD-10-CM | POA: Diagnosis present

## 2016-05-21 DIAGNOSIS — D589 Hereditary hemolytic anemia, unspecified: Secondary | ICD-10-CM | POA: Diagnosis present

## 2016-05-21 DIAGNOSIS — A4152 Sepsis due to Pseudomonas: Principal | ICD-10-CM | POA: Diagnosis present

## 2016-05-21 DIAGNOSIS — Z87891 Personal history of nicotine dependence: Secondary | ICD-10-CM

## 2016-05-21 DIAGNOSIS — K353 Acute appendicitis with localized peritonitis: Secondary | ICD-10-CM | POA: Diagnosis not present

## 2016-05-21 DIAGNOSIS — Z9221 Personal history of antineoplastic chemotherapy: Secondary | ICD-10-CM

## 2016-05-21 DIAGNOSIS — Z9012 Acquired absence of left breast and nipple: Secondary | ICD-10-CM

## 2016-05-21 DIAGNOSIS — E039 Hypothyroidism, unspecified: Secondary | ICD-10-CM | POA: Diagnosis present

## 2016-05-21 DIAGNOSIS — R112 Nausea with vomiting, unspecified: Secondary | ICD-10-CM | POA: Diagnosis not present

## 2016-05-21 DIAGNOSIS — R791 Abnormal coagulation profile: Secondary | ICD-10-CM | POA: Diagnosis present

## 2016-05-21 DIAGNOSIS — R109 Unspecified abdominal pain: Secondary | ICD-10-CM | POA: Diagnosis present

## 2016-05-21 DIAGNOSIS — E876 Hypokalemia: Secondary | ICD-10-CM | POA: Diagnosis not present

## 2016-05-21 DIAGNOSIS — G8929 Other chronic pain: Secondary | ICD-10-CM | POA: Diagnosis present

## 2016-05-21 DIAGNOSIS — I9589 Other hypotension: Secondary | ICD-10-CM | POA: Diagnosis not present

## 2016-05-21 DIAGNOSIS — I700 Atherosclerosis of aorta: Secondary | ICD-10-CM | POA: Diagnosis not present

## 2016-05-21 DIAGNOSIS — N390 Urinary tract infection, site not specified: Secondary | ICD-10-CM | POA: Diagnosis present

## 2016-05-21 DIAGNOSIS — J9811 Atelectasis: Secondary | ICD-10-CM | POA: Diagnosis not present

## 2016-05-21 DIAGNOSIS — K3184 Gastroparesis: Secondary | ICD-10-CM | POA: Diagnosis not present

## 2016-05-21 DIAGNOSIS — D7582 Heparin induced thrombocytopenia (HIT): Secondary | ICD-10-CM | POA: Diagnosis not present

## 2016-05-21 DIAGNOSIS — N179 Acute kidney failure, unspecified: Secondary | ICD-10-CM

## 2016-05-21 DIAGNOSIS — R1031 Right lower quadrant pain: Secondary | ICD-10-CM

## 2016-05-21 DIAGNOSIS — K3533 Acute appendicitis with perforation and localized peritonitis, with abscess: Secondary | ICD-10-CM

## 2016-05-21 DIAGNOSIS — M797 Fibromyalgia: Secondary | ICD-10-CM | POA: Diagnosis present

## 2016-05-21 DIAGNOSIS — R1110 Vomiting, unspecified: Secondary | ICD-10-CM | POA: Diagnosis not present

## 2016-05-21 DIAGNOSIS — Z8739 Personal history of other diseases of the musculoskeletal system and connective tissue: Secondary | ICD-10-CM | POA: Diagnosis not present

## 2016-05-21 DIAGNOSIS — I110 Hypertensive heart disease with heart failure: Secondary | ICD-10-CM | POA: Diagnosis present

## 2016-05-21 DIAGNOSIS — Z01810 Encounter for preprocedural cardiovascular examination: Secondary | ICD-10-CM | POA: Diagnosis not present

## 2016-05-21 DIAGNOSIS — M3219 Other organ or system involvement in systemic lupus erythematosus: Secondary | ICD-10-CM | POA: Diagnosis not present

## 2016-05-21 DIAGNOSIS — K219 Gastro-esophageal reflux disease without esophagitis: Secondary | ICD-10-CM | POA: Diagnosis present

## 2016-05-21 DIAGNOSIS — T8189XA Other complications of procedures, not elsewhere classified, initial encounter: Secondary | ICD-10-CM | POA: Diagnosis not present

## 2016-05-21 LAB — APTT: aPTT: 73 seconds — ABNORMAL HIGH (ref 24–36)

## 2016-05-21 LAB — CBC
HEMATOCRIT: 39.5 % (ref 36.0–46.0)
HEMOGLOBIN: 13.8 g/dL (ref 12.0–15.0)
MCH: 30.3 pg (ref 26.0–34.0)
MCHC: 34.9 g/dL (ref 30.0–36.0)
MCV: 86.6 fL (ref 78.0–100.0)
Platelets: 222 10*3/uL (ref 150–400)
RBC: 4.56 MIL/uL (ref 3.87–5.11)
RDW: 14.2 % (ref 11.5–15.5)
WBC: 21.5 10*3/uL — ABNORMAL HIGH (ref 4.0–10.5)

## 2016-05-21 LAB — COMPREHENSIVE METABOLIC PANEL
ALBUMIN: 3.6 g/dL (ref 3.5–5.0)
ALT: 16 U/L (ref 14–54)
ANION GAP: 14 (ref 5–15)
AST: 22 U/L (ref 15–41)
Alkaline Phosphatase: 101 U/L (ref 38–126)
BUN: 32 mg/dL — ABNORMAL HIGH (ref 6–20)
CHLORIDE: 102 mmol/L (ref 101–111)
CO2: 19 mmol/L — AB (ref 22–32)
Calcium: 8.7 mg/dL — ABNORMAL LOW (ref 8.9–10.3)
Creatinine, Ser: 1.43 mg/dL — ABNORMAL HIGH (ref 0.44–1.00)
GFR calc non Af Amer: 43 mL/min — ABNORMAL LOW (ref >60)
GFR, EST AFRICAN AMERICAN: 50 mL/min — AB (ref >60)
GLUCOSE: 88 mg/dL (ref 65–99)
POTASSIUM: 3 mmol/L — AB (ref 3.5–5.1)
SODIUM: 135 mmol/L (ref 135–145)
Total Bilirubin: 1.1 mg/dL (ref 0.3–1.2)
Total Protein: 7.6 g/dL (ref 6.5–8.1)

## 2016-05-21 LAB — URINALYSIS, MICROSCOPIC (REFLEX)

## 2016-05-21 LAB — PROTIME-INR
INR: 4.71
INR: 2.26
PROTHROMBIN TIME: 25.3 s — AB (ref 11.4–15.2)
Prothrombin Time: 43.3 seconds — ABNORMAL HIGH (ref 11.4–15.2)

## 2016-05-21 LAB — URINALYSIS, ROUTINE W REFLEX MICROSCOPIC
GLUCOSE, UA: NEGATIVE mg/dL
Ketones, ur: 15 mg/dL — AB
Nitrite: NEGATIVE
PH: 6 (ref 5.0–8.0)
Protein, ur: 30 mg/dL — AB
SPECIFIC GRAVITY, URINE: 1.015 (ref 1.005–1.030)

## 2016-05-21 LAB — LIPASE, BLOOD: LIPASE: 21 U/L (ref 11–51)

## 2016-05-21 MED ORDER — VITAMIN K1 10 MG/ML IJ SOLN
10.0000 mg | Freq: Once | INTRAVENOUS | Status: AC
  Administered 2016-05-21: 10 mg via INTRAVENOUS
  Filled 2016-05-21: qty 1

## 2016-05-21 MED ORDER — IOPAMIDOL (ISOVUE-300) INJECTION 61%
80.0000 mL | Freq: Once | INTRAVENOUS | Status: AC | PRN
  Administered 2016-05-21: 80 mL via INTRAVENOUS

## 2016-05-21 MED ORDER — PROMETHAZINE HCL 25 MG/ML IJ SOLN
12.5000 mg | INTRAMUSCULAR | Status: DC | PRN
  Administered 2016-05-21: 12.5 mg via INTRAVENOUS
  Administered 2016-05-22 – 2016-05-23 (×7): 25 mg via INTRAVENOUS
  Administered 2016-05-23: 12.5 mg via INTRAVENOUS
  Administered 2016-05-24 – 2016-05-26 (×14): 25 mg via INTRAVENOUS
  Filled 2016-05-21 (×22): qty 1

## 2016-05-21 MED ORDER — PIPERACILLIN-TAZOBACTAM 3.375 G IVPB
3.3750 g | Freq: Three times a day (TID) | INTRAVENOUS | Status: DC
  Administered 2016-05-21 – 2016-05-23 (×7): 3.375 g via INTRAVENOUS
  Filled 2016-05-21 (×9): qty 50

## 2016-05-21 MED ORDER — UMECLIDINIUM-VILANTEROL 62.5-25 MCG/INH IN AEPB
1.0000 | INHALATION_SPRAY | Freq: Every day | RESPIRATORY_TRACT | Status: DC
  Administered 2016-05-24 – 2016-05-30 (×6): 1 via RESPIRATORY_TRACT
  Filled 2016-05-21 (×3): qty 14

## 2016-05-21 MED ORDER — PROMETHAZINE HCL 25 MG/ML IJ SOLN
12.5000 mg | Freq: Once | INTRAMUSCULAR | Status: AC
  Administered 2016-05-21: 12.5 mg via INTRAVENOUS
  Filled 2016-05-21: qty 1

## 2016-05-21 MED ORDER — ACETAMINOPHEN 500 MG PO TABS
1000.0000 mg | ORAL_TABLET | Freq: Once | ORAL | Status: AC
Start: 2016-05-21 — End: 2016-05-21
  Administered 2016-05-21: 1000 mg via ORAL
  Filled 2016-05-21: qty 2

## 2016-05-21 MED ORDER — PANTOPRAZOLE SODIUM 40 MG IV SOLR
40.0000 mg | Freq: Once | INTRAVENOUS | Status: AC
  Administered 2016-05-21: 40 mg via INTRAVENOUS
  Filled 2016-05-21: qty 40

## 2016-05-21 MED ORDER — ACETAMINOPHEN 650 MG RE SUPP: 650.0000 mg | Freq: Four times a day (QID) | RECTAL | Status: DC | PRN

## 2016-05-21 MED ORDER — SODIUM CHLORIDE 0.9 % IV BOLUS (SEPSIS): 1000.0000 mL | Freq: Once | INTRAVENOUS | Status: DC

## 2016-05-21 MED ORDER — ONDANSETRON HCL 4 MG/2ML IJ SOLN
4.0000 mg | Freq: Once | INTRAMUSCULAR | Status: AC
  Administered 2016-05-23 (×2): 4 mg via INTRAVENOUS

## 2016-05-21 MED ORDER — POTASSIUM CHLORIDE IN NACL 20-0.9 MEQ/L-% IV SOLN
INTRAVENOUS | Status: DC
  Administered 2016-05-21 – 2016-05-23 (×2): via INTRAVENOUS
  Filled 2016-05-21: qty 1000

## 2016-05-21 MED ORDER — ROPINIROLE HCL 1 MG PO TABS
1.0000 mg | ORAL_TABLET | Freq: Every day | ORAL | Status: DC
  Administered 2016-05-22 – 2016-05-29 (×8): 1 mg via ORAL
  Filled 2016-05-21 (×9): qty 1

## 2016-05-21 MED ORDER — LEVOTHYROXINE SODIUM 100 MCG PO TABS
100.0000 ug | ORAL_TABLET | Freq: Every day | ORAL | Status: DC
  Administered 2016-05-22 – 2016-05-30 (×9): 100 ug via ORAL
  Filled 2016-05-21 (×10): qty 1

## 2016-05-21 MED ORDER — LORAZEPAM 2 MG/ML IJ SOLN
0.5000 mg | Freq: Four times a day (QID) | INTRAMUSCULAR | Status: DC | PRN
  Administered 2016-05-22 – 2016-05-26 (×3): 0.5 mg via INTRAVENOUS
  Filled 2016-05-21 (×4): qty 1

## 2016-05-21 MED ORDER — SODIUM CHLORIDE 0.9 % IV BOLUS (SEPSIS)
1000.0000 mL | Freq: Once | INTRAVENOUS | Status: AC
  Administered 2016-05-21: 1000 mL via INTRAVENOUS

## 2016-05-21 MED ORDER — VITAMIN K1 10 MG/ML IJ SOLN
INTRAMUSCULAR | Status: AC
  Filled 2016-05-21: qty 1

## 2016-05-21 MED ORDER — SODIUM CHLORIDE 0.9 % IV SOLN
1000.0000 mL | Freq: Once | INTRAVENOUS | Status: AC
  Administered 2016-05-21: 1000 mL via INTRAVENOUS

## 2016-05-21 MED ORDER — HYDROMORPHONE HCL 1 MG/ML IJ SOLN
1.0000 mg | INTRAMUSCULAR | Status: DC | PRN
  Administered 2016-05-21 – 2016-05-23 (×7): 1 mg via INTRAVENOUS
  Administered 2016-05-23: 1.5 mg via INTRAVENOUS
  Administered 2016-05-24 – 2016-05-26 (×17): 1 mg via INTRAVENOUS
  Filled 2016-05-21 (×9): qty 1
  Filled 2016-05-21: qty 2
  Filled 2016-05-21 (×16): qty 1

## 2016-05-21 MED ORDER — ACETAMINOPHEN 325 MG PO TABS
650.0000 mg | ORAL_TABLET | Freq: Four times a day (QID) | ORAL | Status: DC | PRN
  Administered 2016-05-22 – 2016-05-24 (×3): 650 mg via ORAL
  Filled 2016-05-21 (×3): qty 2

## 2016-05-21 MED ORDER — MORPHINE SULFATE (PF) 4 MG/ML IV SOLN
4.0000 mg | Freq: Once | INTRAVENOUS | Status: AC
  Administered 2016-05-21: 4 mg via INTRAVENOUS
  Filled 2016-05-21: qty 1

## 2016-05-21 NOTE — ED Notes (Signed)
Patient's temperature 100.8 oral. Advised Dr Lacinda Axon. Verbal order for tylenol, 1000 mg, PO.

## 2016-05-21 NOTE — ED Notes (Signed)
CRITICAL VALUE ALERT  Critical value received: IINR 4.71  Date of notification:  05/21/16  Time of notification:  1728  Critical value read back:yes  Nurse who received alert:  Susa Day MD notified (1st page):  Dr. Rosana Hoes and Dr. Eulis Foster

## 2016-05-21 NOTE — Progress Notes (Signed)
ANTICOAGULATION CONSULT NOTE - Initial Consult Pharmacy Consult for Coumadin Reversal, argatroban dosing  Indication: Mechanical Mitral Valve  No Known Allergies  Patient Measurements: Height: 5' 4" (162.6 cm) Weight: 166 lb (75.3 kg) IBW/kg (Calculated) : 54.7 Heparin Dosing Weight:  Vital Signs: Temp: 100.8 F (38.2 C) (04/02 1616) Temp Source: Oral (04/02 1616) BP: 91/60 (04/02 1700) Pulse Rate: 88 (04/02 1700)  Labs:  Recent Labs  05/21/16 1130 05/21/16 1358 05/21/16 1405  HGB 13.8  --   --   HCT 39.5  --   --   PLT 222  --   --   LABPROT  --   --  43.3*  INR  --   --  4.71*  CREATININE  --  1.43*  --     Estimated Creatinine Clearance: 48.3 mL/min (A) (by C-G formula based on SCr of 1.43 mg/dL (H)).   Medical History: Past Medical History:  Diagnosis Date  . Abnormal Papanicolaou smear of cervix with positive human papilloma virus (HPV) test 08/17/2015  . Anticoagulation goal of INR 2.5 to 3.5   . Anxiety   . B12 deficiency   . BRCA1 negative   . BRCA2 negative   . Breast cancer ([facility]) 2011   Left side  . Bursitis of right knee    Septic bursitis  . Drug-induced hepatitis    States per her rheumatologist, transaminases elevated but normalized after drug removed for   . Fibromyalgia   . 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 History of abnormal cervical Pap smear 08/12/2015  . Mitral valve disease, rheumatic    St. Jude prosthesis  . Peripheral neuropathy ([facility])   . Pneumonia   . Raynaud disease   . Raynaud disease   . SLE (systemic lupus erythematosus) ([facility])     Medications    Assessment: INR elevated (4.71), goal INR 2.5 - 3.5 Near obliterated subacute appendicitis with loculated abscess formation, complicated by elevated INR. Non-operative management preferable per surgery. Past indication of HITT, records from [facility] being obtained   Goal of Therapy: Anticoagulation due to mitral valve Monitor platelets by  anticoagulation protocol    Plan:  Vitamin K 10 mg IV x 1 dose in ED Repeat INR in AM  APTT in AM CBC in AM Start Argabroban when INR less than 2.5  ,  [place] 05/21/2016,7:07 PM  

## 2016-05-21 NOTE — ED Provider Notes (Signed)
[facility] DEPT Provider Note   CSN: [id number] Arrival date & time: 05/21/16  1101     History   Chief Complaint Chief Complaint  Patient presents with  . Abdominal Pain  . Emesis    HPI [name] is a 46 y.o. female.  Nausea, vomiting, diarrhea since Thursday with associated generalized abdominal pain. Decreased oral intake. Pain is localized in the lower abdomen. She feels dehydrated. Severity is moderate. Palpation makes pain worse.      Past Medical History:  Diagnosis Date  . Abnormal Papanicolaou smear of cervix with positive human papilloma virus (HPV) test 08/17/2015  . Anticoagulation goal of INR 2.5 to 3.5   . Anxiety   . B12 deficiency   . BRCA1 negative   . BRCA2 negative   . Breast cancer ([place]) 2011   Left side  . Bursitis of right knee    Septic bursitis  . Drug-induced hepatitis    States per her rheumatologist, transaminases elevated but normalized after drug removed for   . Fibromyalgia   . 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 History of abnormal cervical Pap smear 08/12/2015  . Mitral valve disease, rheumatic    St. Jude prosthesis  . Peripheral neuropathy ([place])   . Pneumonia   . Raynaud disease   . Raynaud disease   . SLE (systemic lupus erythematosus) ([place])     Patient Active Problem List   Diagnosis Date Noted  . Vaginal discharge 11/14/2015  . Vaginal burning 11/14/2015  . BV (bacterial vaginosis) 11/14/2015  . Abnormal Papanicolaou smear of cervix with positive human papilloma virus (HPV) test 08/17/2015  . History of abnormal cervical Pap smear 08/12/2015  . Primary hypothyroidism 12/07/2014  . Primary hypercoagulable state ([place]) 12/14/2013  . Encounter for monitoring cardiotoxic drug therapy 11/23/2013  . Gastroparesis 08/13/2013  . Encounter for therapeutic drug monitoring 03/18/2013  . Dyspepsia 03/12/2013  . Dyspnea 11/14/2012  . Anticoagulation goal of INR 2.5 to 3.5   .  Peripheral neuropathy ([place])   . B12 deficiency 08/21/2010  . 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 BRCA1 negative 08/16/2010  . BRCA2 negative 08/16/2010  . Long term (current) use of anticoagulants 05/17/2010  . Malignant neoplasm of upper outer quadrant of female breast ([place]) 02/24/2010  . History of mitral valve replacement with mechanical valve 11/10/2009  . Systemic lupus erythematosus ([place]) 11/10/2009    Past Surgical History:  Procedure Laterality Date  . BREAST SURGERY    . CHOLECYSTECTOMY    . ENDOMETRIAL ABLATION  2008   She no longer has menses  . ESOPHAGOGASTRODUODENOSCOPY N/A 03/25/2013   Dr. Raliegh Scarlet reflux esophagitis-likely source of patient's symptoms (patulous EG junction). Hiatal hernia otherwise normal  . Insertion expander left breast  11/16/10  . MASTECTOMY    . MASTOPEXY  02/14/2011   Procedure: MASTOPEXY;  Surgeon: Macon Large;  Location: [facility];  Service: Plastics;  Laterality: Bilateral;  Right Breast Reduction   . MITRAL VALVE REPLACEMENT    . TISSUE EXPANDER PLACEMENT  02/14/2011   Procedure: TISSUE EXPANDER;  Surgeon: Macon Large;  Location: [facility];  Service: Plastics;  Laterality: Bilateral;  Left Breast Remove Tissue Expander Placement of Implant Breast Reconstruction  . TUBAL LIGATION      OB History    Gravida Para Term Preterm AB Living   _0 SAB TAB Ectopic Multiple Live Births  2       Home Medications    Prior to Admission medications   Medication Sig Start Date End Date Taking? Authorizing Provider  Albuterol (VENTOLIN IN) Inhale into the lungs as needed.   Yes Historical Provider, MD  ALPRAZolam Duanne Moron) 0.5 MG tablet Take 0.5 mg by mouth at bedtime.  11/02/13  Yes Historical Provider, MD  ANORO ELLIPTA 62.5-25 MCG/INH AEPB Inhale 1 puff into the lungs daily.  05/15/16  Yes Historical Provider, MD  cyanocobalamin (,VITAMIN B-12,) 1000 MCG/ML injection Inject 1 mL  (1,000 mcg total) into the muscle every 30 (thirty) days. 12/19/10  Yes Everardo All, MD  dexlansoprazole (DEXILANT) 60 MG capsule Take 1 capsule (60 mg total) by mouth daily. 06/16/15  Yes Annitta Needs, NP  furosemide (LASIX) 40 MG tablet TAKE ONE TABLET BY MOUTH ONCE DAILY. 02/07/16  Yes Satira Sark, MD  gabapentin (NEURONTIN) 600 MG tablet Take 600 mg by mouth 2 (two) times daily.    Yes Historical Provider, MD  ibuprofen (ADVIL,MOTRIN) 800 MG tablet Take 800 mg by mouth every 6 (six) hours as needed. Reported on 08/30/2015   Yes Historical Provider, MD  levothyroxine (SYNTHROID, LEVOTHROID) 100 MCG tablet Take 1 tablet (100 mcg total) by mouth daily. 12/27/15  Yes Cassandria Anger, MD  metoCLOPramide (REGLAN) 5 MG tablet Take 5 mg by mouth 2 (two) times daily.   Yes Historical Provider, MD  oxyCODONE-acetaminophen (PERCOCET) 7.5-325 MG per tablet Take 1 tablet by mouth every 4 (four) hours as needed for pain. Reported on 08/30/2015   Yes Historical Provider, MD  potassium chloride (KLOR-CON 10) 10 MEQ tablet Take one tablet daily as directed 11/09/13  Yes Historical Provider, MD  riTUXimab (RITUXAN) 100 MG/10ML injection Inject into the vein. Every 4 months   Yes Historical Provider, MD  rOPINIRole (REQUIP) 0.5 MG tablet Take 1 mg by mouth at bedtime.    Yes Historical Provider, MD  warfarin (COUMADIN) 5 MG tablet Take 2 tablets daily except 1 1/2 tablets on Mondays, Wednesdays and Fridays 01/16/16  Yes Satira Sark, MD  zolpidem (AMBIEN) 10 MG tablet Take 10 mg by mouth at bedtime as needed for sleep.  12/19/10  Yes Ezra Sites, MD  temazepam (RESTORIL) 30 MG capsule Take 1 capsule (30 mg total) by mouth at bedtime as needed for sleep. Patient not taking: Reported on 05/21/2016 04/18/16   Holley Bouche, NP    Family History Family History  Problem Relation Age of Onset  . Hypertension Mother   . Hyperlipidemia Mother   . Hypertension Father   . Colon cancer Neg Hx      Social History Social History  Substance Use Topics  . Smoking status: Former Smoker    Packs/day: 1.00    Years: 15.00    Types: Cigarettes    Quit date: 02/19/2006  . Smokeless tobacco: Never Used     Comment: Quit x 10 years  . Alcohol use 0.0 oz/week     Comment: Occasional     Allergies   Patient has no known allergies.   Review of Systems Review of Systems  All other systems reviewed and are negative.    Physical Exam Updated Vital Signs BP 123/81   Pulse 97   Temp 99.3 F (37.4 C) (Oral)   Resp 18   Ht _0  (1.626 m)   Wt 166 lb (75.3 kg)   SpO2 100%   BMI 28.49 kg/m   Physical Exam  Constitutional: She is oriented to person, place, and time.  Dehydrated, pleasant  HENT:  Head: Normocephalic and atraumatic.  Eyes: Conjunctivae are normal.  Neck: Neck supple.  Cardiovascular: Normal rate and regular rhythm.   Pulmonary/Chest: Effort normal and breath sounds normal.  Abdominal: Soft. Bowel sounds are normal.  Generalized abdominal tenderness, more pronounced in the lower abdomen.  Musculoskeletal: Normal range of motion.  Neurological: She is alert and oriented to person, place, and time.  Skin: Skin is warm and dry.  Psychiatric: She has a normal mood and affect. Her behavior is normal.  Nursing note and vitals reviewed.    ED Treatments / Results  Labs (all labs ordered are listed, but only abnormal results are displayed) Labs Reviewed  COMPREHENSIVE METABOLIC PANEL - Abnormal; Notable for the following:       Result Value   Potassium 3.0 (*)    CO2 19 (*)    BUN 32 (*)    Creatinine, Ser 1.43 (*)    Calcium 8.7 (*)    GFR calc non Af Amer 43 (*)    GFR calc Af Amer 50 (*)    All other components within normal limits  CBC - Abnormal; Notable for the following:    WBC 21.5 (*)    All other components within normal limits  URINALYSIS, ROUTINE W REFLEX MICROSCOPIC - Abnormal; Notable for the following:    APPearance CLOUDY (*)     Hgb urine dipstick TRACE (*)    Bilirubin Urine SMALL (*)    Ketones, ur 15 (*)    Protein, ur 30 (*)    Leukocytes, UA LARGE (*)    All other components within normal limits  URINALYSIS, MICROSCOPIC (REFLEX) - Abnormal; Notable for the following:    Bacteria, UA MANY (*)    Squamous Epithelial / LPF 6-30 (*)    All other components within normal limits  LIPASE, BLOOD    EKG  EKG Interpretation None       Radiology Dg Chest 2 View  Result Date: 05/21/2016 CLINICAL DATA:  Nausea, vomiting, diarrhea, and abdominal pain for the past 4 days. Former smoker. History of breast malignancy. EXAM: CHEST  2 VIEW COMPARISON:  PA and lateral chest x-ray of September 13, 2015 FINDINGS: The lungs are adequately inflated and clear. The heart and pulmonary vascularity are normal. There is calcification in the wall of the aortic arch. The mediastinum is normal in width. The bony thorax exhibits no acute abnormality. There postsurgical changes on the left with a breast implant present. IMPRESSION: There is no pneumonia, CHF, nor other acute cardiopulmonary abnormality. Thoracic aortic atherosclerosis. Electronically Signed   By: David  Martinique M.D.   On: 05/21/2016 12:49    Procedures Procedures (including critical care time)  Medications Ordered in ED Medications  ondansetron (ZOFRAN) injection 4 mg (4 mg Intravenous Not Given 05/21/16 1214)  sodium chloride 0.9 % bolus 1,000 mL (1,000 mLs Intravenous Not Given 05/21/16 1214)  piperacillin-tazobactam (ZOSYN) IVPB 3.375 g (not administered)  sodium chloride 0.9 % bolus 1,000 mL (0 mLs Intravenous Stopped 05/21/16 1419)  promethazine (PHENERGAN) injection 12.5 mg (12.5 mg Intravenous Given 05/21/16 1203)  pantoprazole (PROTONIX) injection 40 mg (40 mg Intravenous Given 05/21/16 1206)  acetaminophen (TYLENOL) tablet 1,000 mg (1,000 mg Oral Given 05/21/16 1312)  0.9 %  sodium chloride infusion (1,000 mLs Intravenous New Bag/Given 05/21/16 1419)  morphine 4 MG/ML  injection 4 mg (4 mg Intravenous Given 05/21/16 1545)  promethazine (PHENERGAN) injection 12.5 mg (12.5  mg Intravenous Given 05/21/16 1546)  iopamidol (ISOVUE-300) 61 % injection 80 mL (80 mLs Intravenous Contrast Given 05/21/16 1518)     Initial Impression / Assessment and Plan / ED Course  I have reviewed the triage vital signs and the nursing notes.  Pertinent labs & imaging results that were available during my care of the patient were reviewed by me and considered in my medical decision making (see chart for details).    CRITICAL CARE Performed by: Nat Christen Total critical care time: 30 minutes Critical care time was exclusive of separately billable procedures and treating other patients. Critical care was necessary to treat or prevent imminent or life-threatening deterioration. Critical care was time spent personally by me on the following activities: development of treatment plan with patient and/or surrogate as well as nursing, discussions with consultants, evaluation of patient's response to treatment, examination of patient, obtaining history from patient or surrogate, ordering and performing treatments and interventions, ordering and review of laboratory studies, ordering and review of radiographic studies, pulse oximetry and re-evaluation of patient's condition.  Initial evaluation suggested gastroenteritis.  However, white blood cell count elevated. CT scan suggests appendicitis. IV fluids, IV Zosyn. Discussed with Dr. Rosana Hoes.  Final Clinical Impressions(s) / ED Diagnoses   Final diagnoses:  Acute appendicitis, unspecified acute appendicitis type    New Prescriptions New Prescriptions   No medications on file     Nat Christen, MD 05/21/16 1610

## 2016-05-21 NOTE — H&P (Addendum)
[facility] History and Physical  KC SEDLAK BHA:[id number] DOB: 1970-12-11 DOA: 05/21/2016  Referring physician: Dr Eulis Foster, [facility] ED PCP: Purvis Kilts, MD   Chief Complaint: Abd pain  HPI: [name] is a 46 y.o. female with history of SLE, mechanical MVR, breast Ca (rx chemo/ mastectomy), autoimmune hepatitis, RA and Raynaud's.  Presenting to ED with 4-5 day history of lower abdominal pain, N/V and fevers up to 102 deg.  In ED WBC was 21K, CT abd done showing perf appendix with abscess.  Seen by gen surgery, not good surgical candidate given INR ^ and additional comorbidities, recommending medical Rx and IR consult for perc drainage.  In ED has rec'd 2 L NS , IV zosyn and getting IV vit K now for INR reversal.  Asked to see for medical admission.    She has had prior GB removal, no other abd surg. Lives in [place] w/ husband, has two grown children, one is tech in ED.  Quit tobacco 10 yrs ago, no sig etoh.    Had MR replacement at [facility] 2008 by "keyhole" surgery; dx'd with SLE around 2009-10.  Breast cancer 2011 treated with chemo but had severe recur sepsis on chemo and didn't tolerate full rounds so had mastectomy.  Hx AI hepatitis.  Didn't tolerate plaqeunil (eye toxicity) or prednisone.  Is taking Rituxan every 4 mos for SLE.  Hx "CHF" on lasix f/b Dr Domenic Polite.    ROS  denies CP  no joint pain   no HA  no blurry vision  no rash  no diarrhea  no nausea/ vomiting  no dysuria  no difficulty voiding  no change in urine color    Past Medical History  Past Medical History:  Diagnosis Date  . Abnormal Papanicolaou smear of cervix with positive human papilloma virus (HPV) test 08/17/2015  . Anticoagulation goal of INR 2.5 to 3.5   . Anxiety   . B12 deficiency   . BRCA1 negative   . BRCA2 negative   . Breast cancer ([place]) 2011   Left side  . Bursitis of right knee    Septic bursitis  . Drug-induced hepatitis    States per her rheumatologist, transaminases  elevated but normalized after drug removed for   . Fibromyalgia   . 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 History of abnormal cervical Pap smear 08/12/2015  . Mitral valve disease, rheumatic    St. Jude prosthesis  . Peripheral neuropathy ([place])   . Pneumonia   . Raynaud disease   . Raynaud disease   . SLE (systemic lupus erythematosus) ([place])    Past Surgical History  Past Surgical History:  Procedure Laterality Date  . BREAST SURGERY    . CHOLECYSTECTOMY    . ENDOMETRIAL ABLATION  2008   She no longer has menses  . ESOPHAGOGASTRODUODENOSCOPY N/A 03/25/2013   Dr. Raliegh Scarlet reflux esophagitis-likely source of patient's symptoms (patulous EG junction). Hiatal hernia otherwise normal  . Insertion expander left breast  11/16/10  . MASTECTOMY    . MASTOPEXY  02/14/2011   Procedure: MASTOPEXY;  Surgeon: Macon Large;  Location: [facility];  Service: Plastics;  Laterality: Bilateral;  Right Breast Reduction   . MITRAL VALVE REPLACEMENT    . TISSUE EXPANDER PLACEMENT  02/14/2011   Procedure: TISSUE EXPANDER;  Surgeon: Macon Large;  Location: [facility];  Service: Plastics;  Laterality: Bilateral;  Left Breast Remove Tissue Expander Placement of Implant Breast Reconstruction  . TUBAL  LIGATION     Family History  Family History  Problem Relation Age of Onset  . Hypertension Mother   . Hyperlipidemia Mother   . Hypertension Father   . Colon cancer Neg Hx    Social History  reports that she quit smoking about 10 years ago. Her smoking use included Cigarettes. She has a 15.00 pack-year smoking history. She has never used smokeless tobacco. She reports that she drinks alcohol. She reports that she does not use drugs. Allergies No Known Allergies Home medications Prior to Admission medications   Medication Sig Start Date End Date Taking? Authorizing Provider  Albuterol (VENTOLIN IN) Inhale into the lungs as needed.   Yes Historical Provider, MD  ALPRAZolam  Duanne Moron) 0.5 MG tablet Take 0.5 mg by mouth at bedtime.  11/02/13  Yes Historical Provider, MD  ANORO ELLIPTA 62.5-25 MCG/INH AEPB Inhale 1 puff into the lungs daily.  05/15/16  Yes Historical Provider, MD  cyanocobalamin (,VITAMIN B-12,) 1000 MCG/ML injection Inject 1 mL (1,000 mcg total) into the muscle every 30 (thirty) days. 12/19/10  Yes Everardo All, MD  dexlansoprazole (DEXILANT) 60 MG capsule Take 1 capsule (60 mg total) by mouth daily. 06/16/15  Yes Annitta Needs, NP  furosemide (LASIX) 40 MG tablet TAKE ONE TABLET BY MOUTH ONCE DAILY. 02/07/16  Yes Satira Sark, MD  gabapentin (NEURONTIN) 600 MG tablet Take 600 mg by mouth 2 (two) times daily.    Yes Historical Provider, MD  ibuprofen (ADVIL,MOTRIN) 800 MG tablet Take 800 mg by mouth every 6 (six) hours as needed. Reported on 08/30/2015   Yes Historical Provider, MD  levothyroxine (SYNTHROID, LEVOTHROID) 100 MCG tablet Take 1 tablet (100 mcg total) by mouth daily. 12/27/15  Yes Cassandria Anger, MD  metoCLOPramide (REGLAN) 5 MG tablet Take 5 mg by mouth 2 (two) times daily.   Yes Historical Provider, MD  oxyCODONE-acetaminophen (PERCOCET) 7.5-325 MG per tablet Take 1 tablet by mouth every 4 (four) hours as needed for pain. Reported on 08/30/2015   Yes Historical Provider, MD  potassium chloride (KLOR-CON 10) 10 MEQ tablet Take one tablet daily as directed 11/09/13  Yes Historical Provider, MD  riTUXimab (RITUXAN) 100 MG/10ML injection Inject into the vein. Every 4 months   Yes Historical Provider, MD  rOPINIRole (REQUIP) 0.5 MG tablet Take 1 mg by mouth at bedtime.    Yes Historical Provider, MD  warfarin (COUMADIN) 5 MG tablet Take 2 tablets daily except 1 1/2 tablets on Mondays, Wednesdays and Fridays 01/16/16  Yes Satira Sark, MD  zolpidem (AMBIEN) 10 MG tablet Take 10 mg by mouth at bedtime as needed for sleep.  12/19/10  Yes Ezra Sites, MD  temazepam (RESTORIL) 30 MG capsule Take 1 capsule (30 mg total) by mouth at bedtime as  needed for sleep. Patient not taking: Reported on 05/21/2016 04/18/16   Holley Bouche, NP   Liver Function Tests  Recent Labs Lab 05/21/16 1358  AST 22  ALT 16  ALKPHOS 101  BILITOT 1.1  PROT 7.6  ALBUMIN 3.6    Recent Labs Lab 05/21/16 1358  LIPASE 21   CBC  Recent Labs Lab 05/21/16 1130  WBC 21.5*  HGB 13.8  HCT 39.5  MCV 86.6  PLT 517   Basic Metabolic Panel  Recent Labs Lab 05/21/16 1358  NA 135  K 3.0*  CL 102  CO2 19*  GLUCOSE 88  BUN 32*  CREATININE 1.43*  CALCIUM 8.7*     Vitals:  05/21/16 1300 05/21/16 1613 05/21/16 1616 05/21/16 1700  BP: 123/81 95/62  91/60  Pulse:  92  88  Resp:  16  16  Temp:  98.7 F (37.1 C) (!) 100.8 F (38.2 C)   TempSrc:  Oral Oral   SpO2:  97%  96%  Weight:      Height:       Exam: Gen alert, not in distress, looks uncomfortable due to pain (mild) No rash, cyanosis or gangrene Sclera anicteric, throat clear and slightly dry No jvd or bruits Chest clear bilat RRR no MRG, prosthetic valve click Abd soft, mild/ mod RLQ and SP tenderness, no rebound or guarding, dec'd BS, no ascites No hsm GU defer MS no joint effusions or deformity Ext no LE edema / no wounds or ulcers Neuro is alert, Ox 3 , nf  Na 135 K 3.0  BUN 32  Cr 1.43   Alb 3.6  LFT's ok  eGFR 43 WBC 21k  Hb 13.8 plt 222   INR 4.71   Assessment: 1. Appendicitis w abscess - ruptured w/ loculated fluid collection.  Plan admit, IV abx, IVF"s, correct INR and IR consulted already by ED to eval in am for perc drain of abscess (pt will be transported to [facility] for procedure and then back to [facility]).   2. Prosthetic mitral valve - on coumadin, reversing INR w vit K 10 mg 3. History of HIT - will use argatroban dosing per pharm when INR < 2 4. SLE - on rituxan 5. Hx RA 6. Hx AI hepatitis 7. Hx breast cancer - see above 8. Volume depletion - got 2L NS in ED, still a bit dry  Plan - as above     [place] D [facility] Pager  [phone]   If 7PM-7AM, please contact night-coverage [email or web address] Password [facility] 05/21/2016, 7:16 PM

## 2016-05-21 NOTE — Progress Notes (Signed)
Pharmacy Antibiotic Note  [name] is a 46 y.o. female admitted on 05/21/2016 with appendecitis.  Pharmacy has been consulted for Zosyn dosing.  Plan: Zosyn 3.375g IV q8h (4 hour infusion).  F/U labs, renal function  Height: 5\' 4"  (162.6 cm) Weight: 166 lb (75.3 kg) IBW/kg (Calculated) : 54.7  Temp (24hrs), Avg:99.3 F (37.4 C), Min:99.3 F (37.4 C), Max:99.3 F (37.4 C)   Recent Labs Lab 05/21/16 1130 05/21/16 1358  WBC 21.5*  --   CREATININE  --  1.43*    Estimated Creatinine Clearance: 48.3 mL/min (A) (by C-G formula based on SCr of 1.43 mg/dL (H)).    No Known Allergies  Antimicrobials this admission: Zosyn 4/2 >>     Dose adjustments this admission:     Thank you for allowing pharmacy to be a part of this patient's care.  Chriss Czar 05/21/2016 3:57 PM

## 2016-05-21 NOTE — ED Triage Notes (Signed)
Pt reports n/v/d and abd pain since Thursday.  States she is unable to keep anything down and has diarrhea with every episode of vomiting.

## 2016-05-21 NOTE — ED Provider Notes (Signed)
17: 30-I became involved with the patient's care, after her INR returned elevated.  I discussed the case with general surgery, who is seeing the patient in the emergency department.  He states that he does not plan surgery tonight.  He wants IR, to see the patient for drainage of the abscess.  He would like to have her warfarin reversed, and have her started on a short acting anticoagulant for her mechanical valve, which will allow INR, and/or surgical intervention.  Call placed to hospitalist for admission, Dr. Jonnie Finner agrees.  Call placed to pharmacy for consultation, about starting heparin.  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  The pharmacist recommends giving vitamin K, IV, 10 mg now, and reassess with an INR 12 hours later.  The INR needs to be less than 2 before consideration for the next step and anticoagulant.  The Pharmacist states that this can be done tomorrow morning and he will be here to help with that guidance.  Call placed to interventional radiology, Dr. Earleen Newport, he agrees to be involved with care and has taken the patient's name.  He asked me to order a CT-guided drainage procedure.  This will apparently be done at 1 of the [facility] facilities, where the patient will travel to, by "round-trip".    Daleen Bo, MD 05/21/16 [phone]

## 2016-05-21 NOTE — Consult Note (Signed)
SURGICAL CONSULTATION NOTE (initial) - cpt: 99254  HISTORY OF PRESENT ILLNESS (HPI):  46 y.o. female presented to [facility] with severe intermittent nausea and vomiting, fever up to 102, and moderate lower abdominal pain x 4.5 days that began with poor appetite last Thursday 3/29 and woke her from sleep ~3 am Thursday night/Friday morning. Since that time, patient has not eaten or drank much, and the nausea, vomiting, and pain have all waxed and waned, but have not resolved, prompting her daughter to advise patient to seek ED evaluation. Due to WBC elevated to 21.5, CT abdomen and pelvis was performed. Patient was administered a single dose of Zosyn, and patient reports her nausea and pain have improved somewhat with medication. Patient reports ongoing loose BM's and flatus and denies CP or SOB.  Of note, patient reports she has been on Coumadin > 10 years for a St. Jude's mechanical valve and has been previously told she had an allergic reaction to heparin, but she also says that she's since tolerated Lovenox without complication.  Surgery is consulted by ED physician Dr. Lacinda Axon in this context for evaluation and management of perforated subacute appendicitis with abscess.  PAST MEDICAL HISTORY (PMH):  Past Medical History:  Diagnosis Date  . Abnormal Papanicolaou smear of cervix with positive human papilloma virus (HPV) test 08/17/2015  . Anticoagulation goal of INR 2.5 to 3.5   . Anxiety   . B12 deficiency   . BRCA1 negative   . BRCA2 negative   . Breast cancer ([place]) 2011   Left side  . Bursitis of right knee    Septic bursitis  . Drug-induced hepatitis    States per her rheumatologist, transaminases elevated but normalized after drug removed for   . Fibromyalgia   . 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 History of abnormal cervical Pap smear 08/12/2015  . Mitral valve disease, rheumatic    St. Jude prosthesis  . Peripheral neuropathy ([place])   . Pneumonia    . Raynaud disease   . Raynaud disease   . SLE (systemic lupus erythematosus) ([place])      PAST SURGICAL HISTORY ([place]):  Past Surgical History:  Procedure Laterality Date  . BREAST SURGERY    . CHOLECYSTECTOMY    . ENDOMETRIAL ABLATION  2008   She no longer has menses  . ESOPHAGOGASTRODUODENOSCOPY N/A 03/25/2013   Dr. Raliegh Scarlet reflux esophagitis-likely source of patient's symptoms (patulous EG junction). Hiatal hernia otherwise normal  . Insertion expander left breast  11/16/10  . MASTECTOMY    . MASTOPEXY  02/14/2011   Procedure: MASTOPEXY;  Surgeon: Macon Large;  Location: [facility];  Service: Plastics;  Laterality: Bilateral;  Right Breast Reduction   . MITRAL VALVE REPLACEMENT    . TISSUE EXPANDER PLACEMENT  02/14/2011   Procedure: TISSUE EXPANDER;  Surgeon: Macon Large;  Location: [facility];  Service: Plastics;  Laterality: Bilateral;  Left Breast Remove Tissue Expander Placement of Implant Breast Reconstruction  . TUBAL LIGATION       MEDICATIONS:  Prior to Admission medications   Medication Sig Start Date End Date Taking? Authorizing Provider  Albuterol (VENTOLIN IN) Inhale into the lungs as needed.   Yes Historical Provider, MD  ALPRAZolam Duanne Moron) 0.5 MG tablet Take 0.5 mg by mouth at bedtime.  11/02/13  Yes Historical Provider, MD  ANORO ELLIPTA 62.5-25 MCG/INH AEPB Inhale 1 puff into the lungs daily.  05/15/16  Yes Historical Provider, MD  cyanocobalamin (,VITAMIN B-12,) 1000 MCG/ML  injection Inject 1 mL (1,000 mcg total) into the muscle every 30 (thirty) days. 12/19/10  Yes Everardo All, MD  dexlansoprazole (DEXILANT) 60 MG capsule Take 1 capsule (60 mg total) by mouth daily. 06/16/15  Yes Annitta Needs, NP  furosemide (LASIX) 40 MG tablet TAKE ONE TABLET BY MOUTH ONCE DAILY. 02/07/16  Yes Satira Sark, MD  gabapentin (NEURONTIN) 600 MG tablet Take 600 mg by mouth 2 (two) times daily.    Yes Historical Provider, MD  ibuprofen (ADVIL,MOTRIN) 800 MG tablet Take 800 mg by  mouth every 6 (six) hours as needed. Reported on 08/30/2015   Yes Historical Provider, MD  levothyroxine (SYNTHROID, LEVOTHROID) 100 MCG tablet Take 1 tablet (100 mcg total) by mouth daily. 12/27/15  Yes Cassandria Anger, MD  metoCLOPramide (REGLAN) 5 MG tablet Take 5 mg by mouth 2 (two) times daily.   Yes Historical Provider, MD  oxyCODONE-acetaminophen (PERCOCET) 7.5-325 MG per tablet Take 1 tablet by mouth every 4 (four) hours as needed for pain. Reported on 08/30/2015   Yes Historical Provider, MD  potassium chloride (KLOR-CON 10) 10 MEQ tablet Take one tablet daily as directed 11/09/13  Yes Historical Provider, MD  riTUXimab (RITUXAN) 100 MG/10ML injection Inject into the vein. Every 4 months   Yes Historical Provider, MD  rOPINIRole (REQUIP) 0.5 MG tablet Take 1 mg by mouth at bedtime.    Yes Historical Provider, MD  warfarin (COUMADIN) 5 MG tablet Take 2 tablets daily except 1 1/2 tablets on Mondays, Wednesdays and Fridays 01/16/16  Yes Satira Sark, MD  zolpidem (AMBIEN) 10 MG tablet Take 10 mg by mouth at bedtime as needed for sleep.  12/19/10  Yes Ezra Sites, MD  temazepam (RESTORIL) 30 MG capsule Take 1 capsule (30 mg total) by mouth at bedtime as needed for sleep. Patient not taking: Reported on 05/21/2016 04/18/16   Holley Bouche, NP     ALLERGIES:  No Known Allergies   SOCIAL HISTORY:  Social History   Social History  . Marital status: Married    Spouse name: N/A  . Number of children: 2  . Years of education: N/A   Occupational History  . Not on file.   Social History Main Topics  . Smoking status: Former Smoker    Packs/day: 1.00    Years: 15.00    Types: Cigarettes    Quit date: 02/19/2006  . Smokeless tobacco: Never Used     Comment: Quit x 10 years  . Alcohol use 0.0 oz/week     Comment: Occasional  . Drug use: No  . Sexual activity: Not Currently    Birth control/ protection: Surgical     Comment: tubal and ablation   Other Topics Concern  . Not  on file   Social History Narrative  . No narrative on file    The patient currently resides (home / rehab facility / nursing home): Home  The patient normally is (ambulatory / bedbound): Ambulatory   FAMILY HISTORY:  Family History  Problem Relation Age of Onset  . Hypertension Mother   . Hyperlipidemia Mother   . Hypertension Father   . Colon cancer Neg Hx     REVIEW OF SYSTEMS:  Constitutional: denies weight loss, fever, chills, or sweats  Eyes: denies any other vision changes, history of eye injury  ENT: denies sore throat, hearing problems  Respiratory: denies shortness of breath, wheezing  Cardiovascular: denies chest pain, palpitations  Gastrointestinal: abdominal pain, N/V, and loose  BM's as per HPI Genitourinary: denies burning with urination or urinary frequency Musculoskeletal: denies any other joint pains or cramps  Skin: denies any other rashes or skin discolorations  Neurological: denies any other headache, dizziness, weakness  Psychiatric: denies any other depression, anxiety   All other review of systems were negative   VITAL SIGNS:  Temp:  [98.7 F (37.1 C)-100.8 F (38.2 C)] 100.8 F (38.2 C) (04/02 1616) Pulse Rate:  [92-97] 92 (04/02 1613) Resp:  [16-18] 16 (04/02 1613) BP: (95-123)/(62-84) 95/62 (04/02 1613) SpO2:  [97 %-100 %] 97 % (04/02 1613) Weight:  [75.3 kg (166 lb)] 75.3 kg (166 lb) (04/02 1109)     Height: _0  (162.6 cm) Weight: 75.3 kg (166 lb) BMI (Calculated): 28.6   INTAKE/OUTPUT:  This shift: No intake/output data recorded.  Last 2 shifts: _1 @   PHYSICAL EXAM:  Constitutional:  -- Normal overweight body habitus  -- Awake, alert, and oriented x3  Eyes:  -- Pupils equally round and reactive to light  -- No scleral icterus  Ear, nose, and throat:  -- No jugular venous distension  Pulmonary:  -- No crackles  -- Equal breath sounds bilaterally -- Breathing non-labored at rest Cardiovascular:  -- S1, S2 present  --  No pericardial rubs Gastrointestinal:  -- Abdomen soft, moderate R > L lower abdominal tenderness without guarding/rebound  -- No abdominal masses appreciated, pulsatile or otherwise  Musculoskeletal and Integumentary:  -- Wounds or skin discoloration: None appreciated -- Extremities: B/L UE and LE FROM, hands and feet warm  Neurologic:  -- Motor function: intact and symmetric -- Sensation: intact and symmetric  Labs:  CBC:  Lab Results  Component Value Date   WBC 21.5 (H) 05/21/2016   RBC 4.56 05/21/2016   BMP:  Lab Results  Component Value Date   GLUCOSE 88 05/21/2016   CO2 19 (L) 05/21/2016   BUN 32 (H) 05/21/2016   CREATININE 1.43 (H) 05/21/2016   CREATININE 0.88 11/14/2012   CALCIUM 8.7 (L) 05/21/2016   INR: 4.71   Imaging studies:  CT Abdomen and Pelvis with Contrast (05/21/2016) - personally reviewed bedside with patient and patient's daughter  Appendicitis with ruptured appendix (appendicoliths are present). Loculated 4.5 x 4 x 3.8 cm loculated abscess surrounds/ envelops the ruptured appendix. Inflammatory process impresses upon the right fallopian tube/ adnexae and is immediately adjacent to terminal ileum.  Prominent calcified plaque throughout the abdominal aorta, iliac arteries and femoral arteries with subsequent narrowing. Splenic artery calcifications with ectasia. Coronary artery calcifications.Enlarged liver spanning over 19.5 cm. Left upper pole 1.4 cm cyst. Tiny low-density structures in both kidneys may be cysts although too small to characterize.  Assessment/Plan: (ICD-10's: K35.3) 46 y.o. female with near-obliterated perforated subacute appendicitis with loculated abscess formation, complicated by Coumadin-induced coagulopathy (INR: 4.71) for mechanical mitral valve and pertinent additional comorbidities including lupus, rheumatic heart disease, aortic calcified atherosclerosis, breast cancer, fibromyalgia, and generalized anxiety disorder.   - continue  IV antibiotics (Zosyn)   - pain control prn, anti-emetics, and IV fluids  - considering near-obliteration of appendix complicated further by coagulopathy, non-operative management preferable for this patient  - reverse coagulopathy as per hospitalist pending IR drainage of loculated abscess  - obtain records regarding possible HIT(T) from [facility] and consider Argatroban  - if no intervention planned for today, okay with clear liquids prn  - please call if any questions  - DVT prophylaxis  All of the above findings and recommendations were discussed with the patient and her  family and ED physician (Dr. Eulis Foster), and all of patient's and her family's questions were answered to their expressed satisfaction.  Thank you for the opportunity to participate in this patient's care.   -- Marilynne Drivers Rosana Hoes, MD, [place]: [facility] Office: [phone]

## 2016-05-22 DIAGNOSIS — A419 Sepsis, unspecified organism: Secondary | ICD-10-CM

## 2016-05-22 DIAGNOSIS — K353 Acute appendicitis with localized peritonitis: Secondary | ICD-10-CM

## 2016-05-22 DIAGNOSIS — N179 Acute kidney failure, unspecified: Secondary | ICD-10-CM

## 2016-05-22 DIAGNOSIS — M3219 Other organ or system involvement in systemic lupus erythematosus: Secondary | ICD-10-CM

## 2016-05-22 LAB — COMPREHENSIVE METABOLIC PANEL
ALBUMIN: 3 g/dL — AB (ref 3.5–5.0)
ALT: 16 U/L (ref 14–54)
AST: 19 U/L (ref 15–41)
Alkaline Phosphatase: 91 U/L (ref 38–126)
Anion gap: 8 (ref 5–15)
BUN: 21 mg/dL — AB (ref 6–20)
CHLORIDE: 109 mmol/L (ref 101–111)
CO2: 22 mmol/L (ref 22–32)
CREATININE: 0.89 mg/dL (ref 0.44–1.00)
Calcium: 8.5 mg/dL — ABNORMAL LOW (ref 8.9–10.3)
GFR calc Af Amer: >60 mL/min (ref >60)
GLUCOSE: 95 mg/dL (ref 65–99)
Potassium: 3 mmol/L — ABNORMAL LOW (ref 3.5–5.1)
SODIUM: 139 mmol/L (ref 135–145)
Total Bilirubin: 1.5 mg/dL — ABNORMAL HIGH (ref 0.3–1.2)
Total Protein: 6.4 g/dL — ABNORMAL LOW (ref 6.5–8.1)

## 2016-05-22 LAB — APTT
aPTT: 53 seconds — ABNORMAL HIGH (ref 24–36)
aPTT: 133 seconds — ABNORMAL HIGH (ref 24–36)
aPTT: 150 seconds — ABNORMAL HIGH (ref 24–36)
aPTT: 141 seconds — ABNORMAL HIGH (ref 24–36)

## 2016-05-22 LAB — PROTIME-INR
INR: 1.69
Prothrombin Time: 20.1 seconds — ABNORMAL HIGH (ref 11.4–15.2)

## 2016-05-22 LAB — CBC
HCT: 32.2 % — ABNORMAL LOW (ref 36.0–46.0)
Hemoglobin: 10.9 g/dL — ABNORMAL LOW (ref 12.0–15.0)
MCH: 29.5 pg (ref 26.0–34.0)
MCHC: 33.9 g/dL (ref 30.0–36.0)
MCV: 87.3 fL (ref 78.0–100.0)
PLATELETS: 194 10*3/uL (ref 150–400)
RBC: 3.69 MIL/uL — ABNORMAL LOW (ref 3.87–5.11)
RDW: 14.1 % (ref 11.5–15.5)
WBC: 12.8 10*3/uL — ABNORMAL HIGH (ref 4.0–10.5)

## 2016-05-22 MED ORDER — ARGATROBAN 50 MG/50ML IV SOLN
9.0000 ug/min | INTRAVENOUS | Status: DC
  Administered 2016-05-23: 9 ug/min via INTRAVENOUS
  Filled 2016-05-22 (×2): qty 50

## 2016-05-22 MED ORDER — OXYCODONE-ACETAMINOPHEN 5-325 MG PO TABS
2.0000 | ORAL_TABLET | Freq: Once | ORAL | Status: AC
  Administered 2016-05-22: 2 via ORAL
  Filled 2016-05-22: qty 2

## 2016-05-22 MED ORDER — ARGATROBAN 50 MG/50ML IV SOLN
74.5000 ug/min | INTRAVENOUS | Status: DC
  Administered 2016-05-22 (×2): 74.5 ug/min via INTRAVENOUS
  Filled 2016-05-22 (×2): qty 50

## 2016-05-22 MED ORDER — SODIUM CHLORIDE 0.9 % IV BOLUS (SEPSIS)
500.0000 mL | Freq: Once | INTRAVENOUS | Status: AC
  Administered 2016-05-22: 500 mL via INTRAVENOUS

## 2016-05-22 MED ORDER — ARGATROBAN 50 MG/50ML IV SOLN
18.0000 ug/min | INTRAVENOUS | Status: DC
  Filled 2016-05-22: qty 50

## 2016-05-22 MED ORDER — ARGATROBAN 50 MG/50ML IV SOLN
37.0000 ug/min | INTRAVENOUS | Status: DC
  Filled 2016-05-22: qty 50

## 2016-05-22 NOTE — Progress Notes (Addendum)
After receiving  report from carelink this nurse called rapid response to come see patient.

## 2016-05-22 NOTE — Progress Notes (Signed)
Pt. Oral temp 103.1. Tylenol given, MD notified and passed on to night shift nurse.

## 2016-05-22 NOTE — Progress Notes (Signed)
Text paged Chaney Malling NP to inform of patient arrival to unit and rapid response call.Awaiting a response.

## 2016-05-22 NOTE — Progress Notes (Signed)
[place] for Coumadin Reversal, argatroban dosing  Indication: Mechanical Mitral Valve  No Known Allergies  Patient Measurements: Height: 5' 4"  (162.6 cm) Weight: 164 lb 4.8 oz (74.5 kg) IBW/kg (Calculated) : 54.7  Vital Signs: Temp: 98.2 F (36.8 C) (04/03 1017) Temp Source: Oral (04/03 0535) BP: 85/69 (04/03 0535) Pulse Rate: 73 (04/03 0535)  Labs:  Recent Labs  05/21/16 1130 05/21/16 1358 05/21/16 1405 05/21/16 1910 05/22/16 0535 05/22/16 1135  HGB 13.8  --   --   --  10.9*  --   HCT 39.5  --   --   --  32.2*  --   PLT 222  --   --   --  194  --   APTT  --   --   --  73* 53* 133*  LABPROT  --   --  43.3* 25.3* 20.1*  --   INR  --   --  4.71* 2.26 1.69  --   CREATININE  --  1.43*  --   --  0.89  --    Estimated Creatinine Clearance: 77.2 mL/min (by C-G formula based on SCr of 0.89 mg/dL).  Medical History: Past Medical History:  Diagnosis Date  . Abnormal Papanicolaou smear of cervix with positive human papilloma virus (HPV) test 08/17/2015  . Anticoagulation goal of INR 2.5 to 3.5   . Anxiety   . B12 deficiency   . BRCA1 negative   . BRCA2 negative   . Breast cancer ([place]) 2011   Left side  . Bursitis of right knee    Septic bursitis  . Drug-induced hepatitis    States per her rheumatologist, transaminases elevated but normalized after drug removed for   . Fibromyalgia   . 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 History of abnormal cervical Pap smear 08/12/2015  . Mitral valve disease, rheumatic    St. Jude prosthesis  . Peripheral neuropathy ([place])   . Pneumonia   . Raynaud disease   . Raynaud disease   . SLE (systemic lupus erythematosus) ([facility])    Medications Prior to Admission  Medication Sig Dispense Refill  . Albuterol (VENTOLIN IN) Inhale into the lungs as needed.    . ALPRAZolam (XANAX) 0.5 MG tablet Take 0.5 mg by mouth at bedtime.     Jearl Klinefelter ELLIPTA 62.5-25 MCG/INH  AEPB Inhale 1 puff into the lungs daily.     . cyanocobalamin (,VITAMIN B-12,) 1000 MCG/ML injection Inject 1 mL (1,000 mcg total) into the muscle every 30 (thirty) days. 10 mL prn  . dexlansoprazole (DEXILANT) 60 MG capsule Take 1 capsule (60 mg total) by mouth daily. 90 capsule 3  . furosemide (LASIX) 40 MG tablet TAKE ONE TABLET BY MOUTH ONCE DAILY. 90 tablet 3  . gabapentin (NEURONTIN) 600 MG tablet Take 600 mg by mouth 2 (two) times daily.     Marland Kitchen ibuprofen (ADVIL,MOTRIN) 800 MG tablet Take 800 mg by mouth every 6 (six) hours as needed. Reported on 08/30/2015    . levothyroxine (SYNTHROID, LEVOTHROID) 100 MCG tablet Take 1 tablet (100 mcg total) by mouth daily. 90 tablet 1  . metoCLOPramide (REGLAN) 5 MG tablet Take 5 mg by mouth 2 (two) times daily.    Marland Kitchen oxyCODONE-acetaminophen (PERCOCET) 7.5-325 MG per tablet Take 1 tablet by mouth every 4 (four) hours as needed for pain. Reported on 08/30/2015    . potassium chloride (KLOR-CON 10) 10 MEQ tablet Take one tablet daily  as directed    . riTUXimab (RITUXAN) 100 MG/10ML injection Inject into the vein. Every 4 months    . rOPINIRole (REQUIP) 0.5 MG tablet Take 1 mg by mouth at bedtime.     Marland Kitchen warfarin (COUMADIN) 5 MG tablet Take 2 tablets daily except 1 1/2 tablets on Mondays, Wednesdays and Fridays 60 tablet 6  . zolpidem (AMBIEN) 10 MG tablet Take 10 mg by mouth at bedtime as needed for sleep.     . temazepam (RESTORIL) 30 MG capsule Take 1 capsule (30 mg total) by mouth at bedtime as needed for sleep. (Patient not taking: Reported on 05/21/2016) 30 capsule 0   Assessment: INR elevated (4.71) on admission, goal INR 2.5 - 3.5,  INR now 1.69 after Vitamin K administration. Near obliterated subacute appendicitis with loculated abscess formation, complicated by elevated INR. Non-operative management preferable per surgery. Past indication of HIT, records from [place] [facility] being obtained.  Pt started on Argatroban, initial aPTT is above target.  Goal of  Therapy: Anticoagulation due to mitral valve APTT 50-90 seconds on argatroban Monitor platelets by anticoagulation protocol   Plan:  Decrease Argatroban to 37 mcg/min, check aPTT in 2 hrs APTT and CBC daily  Monitor for s/sx bleeding complications  Hart Robinsons A 05/22/2016,12:38 PM

## 2016-05-22 NOTE — Progress Notes (Signed)
Report called and given to Morey Hummingbird, Therapist, sports at [place]. Awaiting [facility] transfer.

## 2016-05-22 NOTE — Significant Event (Signed)
Rapid Response Event Note   Received call from Webb Silversmith, RN re pt enroute from [facility] with perforated  appendix.   Pt receiving Agatroban at 18 mcg/min dosing for mechanical mitral valve.  [facility] called re AMS in truck.  I was called to assess pt on arrival.  Overview: Time Called: 2056 Arrival Time: 2100 Event Type: Neurologic  Initial Focused Assessment: Pt ambulated from carelink stretcher to bed with no difficulty.  She is alert, oriented  X 4 , follows commands,pupils PERL  2 mm and sluggish, speech clear, no facial droop, equal grip strength bilaterally, no drift in extremities times 4, no decreased sensation. Pt relates that she was given phenergan 25 mg IV just prior to leaving [facility]  And states it made her"loopy".   VS: 99.0 orally, 98/55 97 SR 19 RR RA 91  C/o RLQ abd pain 8/10   Interventions:  Nasal cannula 2 L  Plan of Care (if not transferred):  Hand off report to Webb Silversmith, RN  Treat pain per medication order, monitor neuro status with VS,  Call for any concerns.  Event Summary:   at      at    Outcome: Stayed in room and stabalized     Bryony Kaman, Gust Brooms

## 2016-05-22 NOTE — Progress Notes (Signed)
[place] for Coumadin Reversal, argatroban dosing  Indication: Mechanical Mitral Valve  No Known Allergies  Patient Measurements: Height: _0  (162.6 cm) Weight: 164 lb 4.8 oz (74.5 kg) IBW/kg (Calculated) : 54.7  Vital Signs: Temp: 98.5 F (36.9 C) (04/03 0535) Temp Source: Oral (04/03 0535) BP: 85/69 (04/03 0535) Pulse Rate: 73 (04/03 0535)  Labs:  Recent Labs  05/21/16 1130 05/21/16 1358 05/21/16 1405 05/21/16 1910 05/22/16 0535  HGB 13.8  --   --   --  10.9*  HCT 39.5  --   --   --  32.2*  PLT 222  --   --   --  194  APTT  --   --   --  73* 53*  LABPROT  --   --  43.3* 25.3* 20.1*  INR  --   --  4.71* 2.26 1.69  CREATININE  --  1.43*  --   --  0.89   Estimated Creatinine Clearance: 77.2 mL/min (by C-G formula based on SCr of 0.89 mg/dL).  Medical History: Past Medical History:  Diagnosis Date  . Abnormal Papanicolaou smear of cervix with positive human papilloma virus (HPV) test 08/17/2015  . Anticoagulation goal of INR 2.5 to 3.5   . Anxiety   . B12 deficiency   . BRCA1 negative   . BRCA2 negative   . Breast cancer ([place]) 2011   Left side  . Bursitis of right knee    Septic bursitis  . Drug-induced hepatitis    States per her rheumatologist, transaminases elevated but normalized after drug removed for   . Fibromyalgia   . 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 History of abnormal cervical Pap smear 08/12/2015  . Mitral valve disease, rheumatic    St. Jude prosthesis  . Peripheral neuropathy ([place])   . Pneumonia   . Raynaud disease   . Raynaud disease   . SLE (systemic lupus erythematosus) ([facility])    Medications Prior to Admission  Medication Sig Dispense Refill  . Albuterol (VENTOLIN IN) Inhale into the lungs as needed.    . ALPRAZolam (XANAX) 0.5 MG tablet Take 0.5 mg by mouth at bedtime.     Jearl Klinefelter ELLIPTA 62.5-25 MCG/INH AEPB Inhale 1 puff into the lungs daily.     .  cyanocobalamin (,VITAMIN B-12,) 1000 MCG/ML injection Inject 1 mL (1,000 mcg total) into the muscle every 30 (thirty) days. 10 mL prn  . dexlansoprazole (DEXILANT) 60 MG capsule Take 1 capsule (60 mg total) by mouth daily. 90 capsule 3  . furosemide (LASIX) 40 MG tablet TAKE ONE TABLET BY MOUTH ONCE DAILY. 90 tablet 3  . gabapentin (NEURONTIN) 600 MG tablet Take 600 mg by mouth 2 (two) times daily.     Marland Kitchen ibuprofen (ADVIL,MOTRIN) 800 MG tablet Take 800 mg by mouth every 6 (six) hours as needed. Reported on 08/30/2015    . levothyroxine (SYNTHROID, LEVOTHROID) 100 MCG tablet Take 1 tablet (100 mcg total) by mouth daily. 90 tablet 1  . metoCLOPramide (REGLAN) 5 MG tablet Take 5 mg by mouth 2 (two) times daily.    Marland Kitchen oxyCODONE-acetaminophen (PERCOCET) 7.5-325 MG per tablet Take 1 tablet by mouth every 4 (four) hours as needed for pain. Reported on 08/30/2015    . potassium chloride (KLOR-CON 10) 10 MEQ tablet Take one tablet daily as directed    . riTUXimab (RITUXAN) 100 MG/10ML injection Inject into the vein. Every 4 months    .  rOPINIRole (REQUIP) 0.5 MG tablet Take 1 mg by mouth at bedtime.     Marland Kitchen warfarin (COUMADIN) 5 MG tablet Take 2 tablets daily except 1 1/2 tablets on Mondays, Wednesdays and Fridays 60 tablet 6  . zolpidem (AMBIEN) 10 MG tablet Take 10 mg by mouth at bedtime as needed for sleep.     . temazepam (RESTORIL) 30 MG capsule Take 1 capsule (30 mg total) by mouth at bedtime as needed for sleep. (Patient not taking: Reported on 05/21/2016) 30 capsule 0   Assessment: INR elevated (4.71) on admission, goal INR 2.5 - 3.5,  INR now 1.69 after Vitamin K Near obliterated subacute appendicitis with loculated abscess formation, complicated by elevated INR. Non-operative management preferable per surgery. Past indication of HITT, records from [facility] being obtained  Goal of Therapy: Anticoagulation due to mitral valve Monitor platelets by anticoagulation protocol   Plan:  Vitamin K 10 mg IV x 1  dose in ED on 05/21/16,  INR now 1.69 Start Argatroban at 2mg/Kg/min, check aPTT in 2 hrs APTT and CBC daily  Monitor for s/sx bleeding complications  HHart RobinsonsA 05/22/2016,7:55 AM

## 2016-05-22 NOTE — Progress Notes (Signed)
I had long discussion with patient and family.  They wanted another opinion regarding her perforated appendicitis.  They expressed desire to transfer to another facility.  Patient expressed that she has been operated by Dr. Alphonsa Overall from [facility] in the past and would like transfer to [facility]. I spoke with Dr. Lisabeth Register and reviewed the case with him.  He is willing to see patient in consult once she arrives to [facility].  Please call [facility] and/or Dr. Dalbert Batman once pt arrives to [facility].  DTat

## 2016-05-22 NOTE — Progress Notes (Signed)
Patient ID: [name], female   DOB: 02-16-1971, 46 y.o.   MRN: [id number]   Request received today for abscess drain placement  Imaging has been reviewed with Dr Anselm Pancoast  Multiloculated collection in Right adnexa No safe window Will not perform drain placement   Will inform Dt Tat RN aware

## 2016-05-22 NOTE — Progress Notes (Signed)
[place] for Argatroban Indication: Mechanical Mitral Valve, ?HIT history  No Known Allergies  Patient Measurements: Height: 5\' 4"  (162.6 cm) Weight: 156 lb 8.4 oz (71 kg) IBW/kg (Calculated) : 54.7  Vital Signs: Temp: 99 F (37.2 C) (04/03 2112) Temp Source: Oral (04/03 2112) BP: 98/55 (04/03 2112) Pulse Rate: 81 (04/03 2129)  Labs:  Recent Labs  05/21/16 1130 05/21/16 1358 05/21/16 1405  05/21/16 1910 05/22/16 0535 05/22/16 1135 05/22/16 1452 05/22/16 1914  HGB 13.8  --   --   --   --  10.9*  --   --   --   HCT 39.5  --   --   --   --  32.2*  --   --   --   PLT 222  --   --   --   --  194  --   --   --   APTT  --   --   --   < > 73* 53* 133* 150* 141*  LABPROT  --   --  43.3*  --  25.3* 20.1*  --   --   --   INR  --   --  4.71*  --  2.26 1.69  --   --   --   CREATININE  --  1.43*  --   --   --  0.89  --   --   --   < > = values in this interval not displayed. Estimated Creatinine Clearance: 75.5 mL/min (by C-G formula based on SCr of 0.89 mg/dL).  Assessment: 89 YOF with St. Jude's mechanical valve with elevated INR on admission at [facility], has received vitamin K and INR now down to 1.69.  Per surgery consult note on 05/21/2016 from [facility], patient has been told "she has allergy to heparin, but has tolerated Lovenox in the past". Noted NO allergies listed in [place] in [place] or [facility] Records. Per notes from [facility], records from [place] being obtained. Noted in outpatient notes in [place] that patient has hemolytic anemia and was diagnosed with SLE based on this.  Patient started on Argatroban at [facility] and continues here (transferred for second surgical opinion regarding perforated appendix). APTT is still elevated at 140 second on Argatroban 35mcg/min (1.33mL/min)  Goal of Therapy: APTT 50-90 seconds on argatroban Monitor platelets by anticoagulation protocol   Plan:  -HOLD argatroban for 30 minutes, then restart at  63mcg/min at [place] (communicated with RN over the phone) -aPTT in 2h after restart -aPTT and CBC daily  -Monitor for s/sx bleeding complications   Joia Doyle D. Marien Manship, PharmD, BCPS Clinical Pharmacist Pager: [phone] 05/22/2016 9:50 PM

## 2016-05-22 NOTE — Progress Notes (Addendum)
SURGICAL PROGRESS NOTE (cpt [phone])  Hospital Day(s): 1.   Post op day(s):  [name]   Interval History: Patient seen and examined, no acute events or new complaints overnight. Patient reports her LLQ abdominal pain has completely resolved while residual RLQ abdominal pain and nausea have been overall controlled with medication. She otherwise denies any further emesis or fever/chills and denies any CP or SOB. IR communicated to hospitalist this morning that there appears to be no safe window for percutaneous drainage of intraperitoneal abscess associated with likely subacute perforation of nearly obliterated appendix.  Review of Systems:  Constitutional: denies fever, chills  HEENT: denies cough or congestion  Respiratory: denies any shortness of breath  Cardiovascular: denies chest pain or palpitations  Gastrointestinal: abdominal pain, N/V, and bowel function as per interval history Genitourinary: denies burning with urination or urinary frequency Musculoskeletal: denies pain, decreased motor or sensation Integumentary: denies any other rashes or skin discolorations Neurological: denies HA or vision/hearing changes   Vital signs in last 24 hours: [min-max] current  Temp:  [98.5 F (36.9 C)-102.8 F (39.3 C)] 98.5 F (36.9 C) (04/03 0535) Pulse Rate:  [73-100] 73 (04/03 0535) Resp:  [15-18] 15 (04/03 0535) BP: (85-123)/(58-84) 85/69 (04/03 0535) SpO2:  [94 %-100 %] 98 % (04/03 0535) Weight:  [74.5 kg (164 lb 4.8 oz)-75.3 kg (166 lb)] 74.5 kg (164 lb 4.8 oz) (04/03 0535)     Height: 5\' 4"  (162.6 cm) Weight: 74.5 kg (164 lb 4.8 oz) BMI (Calculated): 28.3   Intake/Output this shift:  No intake/output data recorded.   Intake/Output last 2 shifts:  @IOLAST2SHIFTS @   Physical Exam:  Constitutional: alert, cooperative and no distress  HENT: normocephalic without obvious abnormality  Eyes: PERRL, EOM's grossly intact and symmetric  Neuro: CN II - XII grossly intact and symmetric without  deficit  Respiratory: breathing non-labored at rest  Cardiovascular: regular rate and sinus rhythm  Gastrointestinal: soft, moderate focal RLQ tenderness to palpation, LLQ now completely non-tender, and non-distended  Musculoskeletal: UE and LE FROM, no edema or wounds, motor and sensation grossly intact, NT   Labs:  CBC Latest Ref Rng & Units 05/22/2016 05/21/2016 04/18/2016  WBC 4.0 - 10.5 K/uL 12.8(H) 21.5(H) 6.6  Hemoglobin 12.0 - 15.0 g/dL 10.9(L) 13.8 13.0  Hematocrit 36.0 - 46.0 % 32.2(L) 39.5 38.5  Platelets 150 - 400 K/uL 194 222 212   BMP Latest Ref Rng & Units 05/22/2016 05/21/2016 04/18/2016  Glucose 65 - 99 mg/dL 95 88 83  BUN 6 - 20 mg/dL 21(H) 32(H) 15  Creatinine 0.44 - 1.00 mg/dL 0.89 1.43(H) 0.68  Sodium 135 - 145 mmol/L 139 135 140  Potassium 3.5 - 5.1 mmol/L 3.0(L) 3.0(L) 4.1  Chloride 101 - 111 mmol/L 109 102 106  CO2 22 - 32 mmol/L 22 19(L) 26  Calcium 8.9 - 10.3 mg/dL 8.5(L) 8.7(L) 9.6    Imaging studies: No new pertinent imaging studies   Assessment/Plan: (ICD-10's: K35.3) 46 y.o. female with near-obliterated perforated subacute appendicitis with loculated abscess formation, complicated by Coumadin-induced coagulopathy (INR: 4.71) for mechanical mitral valve and pertinent additional comorbidities including lupus, rheumatic heart disease, aortic calcified atherosclerosis, breast cancer, fibromyalgia, and generalized anxiety disorder.              - continue IV antibiotics (Zosyn)              - pain control prn, anti-emetics, and IV fluids             - continue clear liquids  diet until pain improves further             - considering near-obliteration of appendix complicated further by coagulopathy, risk of surgical complication greater than potential benefit   - complete 2 weeks course of antibiotics with interval follow-up imaging just prior to completion of antibiotics  - okay to resume bridging to oral anticoagulation tomorrow if continues to improve  clinically  - outpatient follow-up with Dr. Aviva Signs before completing antibiotics course             - please call if any questions  All of the above findings and recommendations were discussed with the patient, her family, and medical physician, and all of patient's and her family's questions were answered to their expressed satisfaction.  Thank you for the opportunity to participate in this patient's care.  -- Marilynne Drivers Rosana Hoes, MD, [place]: [facility] Office: [phone]

## 2016-05-22 NOTE — Care Management Note (Signed)
Case Management Note  Patient Details  Name: [name] MRN: [id number] Date of Birth: 1970/06/25  Subjective/Objective:                  Pt admitted with appendicitis. She is from home, lives with family and is ind with ADL's. She has PCP, transportation and no difficulty affording medications. She has no HH or DME needs pta. No needs communicated. Family at bedside.   Action/Plan: Plan for return home with self care.   Expected Discharge Date:    05/24/2016              Expected Discharge Plan:  Home/Self Care  In-House Referral:  NA  Discharge planning Services  CM Consult  Post Acute Care Choice:  NA Choice offered to:  NA  Status of Service:  Completed, signed off  Sherald Barge, RN 05/22/2016, 2:33 PM

## 2016-05-22 NOTE — Progress Notes (Addendum)
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 The patient denied any dysuria, hematuria, headache, neck pain, chest pain. The patient has chronic cough and shortness of breath for which she follows Dr. Sinda Du. She states that her breathing and coughing is not any worse than usual. Upon presentation, CT of the abdomen and pelvis revealed appendicitis with rupture and associated 4.5 x 4 x 3.8 cm loculated abscess enveloping the ruptured appendix. The inflammatory process impresses upon the right adnexa. Gen. surgery was consulted. They recommended consultation with IR for percutaneous drainage.  Assessment/Plan: Sepsis -Secondary to appendicitis -Continue IV fluids -Continue intravenous Zosyn  Appendicitis with abscess -discussed with IR--no safe window for percutaneous drainage -05/22/16--spoke and updated general surgery--Dr. Rosana Hoes -continue IV abx -am cbc  Pyuria -UA with TNTC WBC -pt already on zosyn  Mechanical Mitral Valve in situ -question whether she had true HIT or if only suspected -she has tolerated enoxaparin on multiple occasions for bridging -continue argatroban for now -pt received vitamin K 10 mg on 4/2  AKI -Baseline creatinine 0.6-0.8 -Presenting creatinine 1.43 -Secondary to sepsis and fluid depletion -Improving with IV fluids  Hypokalemia -replete -check mag  Systemic lupus erythematosus -Patient is next due for Rituxan on 06/29/2016 -follows Dr. Berna Bue -intolerant to steroids -eye toxicity on  plaquenil  Chronic pain -Restart gabapentin and hydrocodone once able to tolerate po -IV hydromorphone for now for acute abdominal pain  Autoimmune hepatitis history -Appears clinically compensated  Hypothyroidism -Continue Synthroid   Disposition Plan:   Home in 3-4 days  Family Communication:   Family at bedside  Consultants:    Code Status:  FULL / DNR  DVT Prophylaxis:  Drain Heparin / [place] Lovenox   Procedures: As Listed in Progress Note Above  Antibiotics: Zosyn 4/2>>>    Subjective: Patient still complains of abdominal pain but somewhat better than yesterday. Has some nausea without emesis. Denies any chest pain, headache, neck pain, dysuria, hematuria complaints of some shortness of breath with nonproductive cough..    Objective: Vitals:   05/22/16 0126 05/22/16 0209 05/22/16 0351 05/22/16 0535  BP:    (!) 85/69  Pulse:    73  Resp:    15  Temp: (!) 102.8 F (39.3 C) (!) 102.7 F (39.3 C) 99.3 F (37.4 C) 98.5 F (36.9 C)  TempSrc: Oral Oral Oral Oral  SpO2:    98%  Weight:    74.5 kg (164 lb 4.8 oz)  Height:        Intake/Output Summary (Last 24 hours) at 05/22/16 6808 Last data filed at 05/22/16 0900  Gross per 24 hour  Intake           416.25 ml  Output              300 ml  Net           116.25 ml   Weight change:  Exam:   General:  Pt is alert, follows commands appropriately, not in acute distress  HEENT: No icterus, No thrush, No neck mass, [place]/AT  Cardiovascular: RRR, S1/S2,  no rubs, no gallops  Respiratory: Diminished breath sounds bilateral. Bibasilar crackles. No wheezing.   Abdomen: Soft/+BS, mild periumbilical and RLQ tender, non distended, no guarding  Extremities: No edema, No lymphangitis, No petechiae, No rashes, no synovitis   Data Reviewed: I have personally reviewed following labs and imaging studies Basic Metabolic Panel:  Recent Labs Lab 05/21/16 1358 05/22/16 0535  NA 135 139  K 3.0* 3.0*  CL 102 109  CO2 19*  22  GLUCOSE 88 95  BUN 32* 21*  CREATININE 1.43* 0.89  CALCIUM 8.7* 8.5*   Liver Function Tests:  Recent Labs Lab 05/21/16 1358 05/22/16 0535  AST 22 19  ALT 16 16  ALKPHOS 101 91  BILITOT 1.1 1.5*  PROT 7.6 6.4*  ALBUMIN 3.6 3.0*    Recent Labs Lab 05/21/16 1358  LIPASE 21   No results for input(s): AMMONIA in the last 168 hours. Coagulation Profile:  Recent Labs Lab 05/16/16 1123 05/21/16 1405 05/21/16 1910 05/22/16 0535  INR 2.9 4.71* 2.26 1.69   CBC:  Recent Labs Lab 05/21/16 1130 05/22/16 0535  WBC 21.5* 12.8*  HGB 13.8 10.9*  HCT 39.5 32.2*  MCV 86.6 87.3  PLT 222 194   Cardiac Enzymes: No results for input(s): CKTOTAL, CKMB, CKMBINDEX, TROPONINI in the last 168 hours. BNP: Invalid input(s): POCBNP CBG: No results for input(s): GLUCAP in the last 168 hours. HbA1C: No results for input(s): HGBA1C in the last 72 hours. Urine analysis:    Component Value Date/Time   COLORURINE YELLOW 05/21/2016 1358   APPEARANCEUR CLOUDY (A) 05/21/2016 1358   LABSPEC 1.015 05/21/2016 1358   PHURINE 6.0 05/21/2016 1358   GLUCOSEU NEGATIVE 05/21/2016 1358   HGBUR TRACE (A) 05/21/2016 1358   BILIRUBINUR SMALL (A) 05/21/2016 1358   KETONESUR 15 (A) 05/21/2016 1358   PROTEINUR 30 (A) 05/21/2016 1358   UROBILINOGEN 0.2 09/06/2010 1600   NITRITE NEGATIVE 05/21/2016 1358   LEUKOCYTESUR LARGE (A) 05/21/2016 1358   Sepsis Labs: @LABRCNTIP (procalcitonin:4,lacticidven:4) )No results found for this or any previous visit (from the past 240 hour(s)).   Scheduled Meds: . levothyroxine  100 mcg Oral QAC breakfast  . ondansetron (ZOFRAN) IV  4 mg Intravenous Once  . piperacillin-tazobactam (ZOSYN)  IV  3.375 g Intravenous Q8H  . rOPINIRole  1 mg Oral QHS  . sodium chloride  1,000 mL Intravenous Once  . umeclidinium-vilanterol  1 puff Inhalation Daily   Continuous Infusions: . 0.9 % NaCl with KCl 20 mEq / L 75 mL/hr at 05/21/16 2331  . argatroban 74.5 mcg/min  (05/22/16 0842)    Procedures/Studies: Dg Chest 2 View  Result Date: 05/21/2016 CLINICAL DATA:  Nausea, vomiting, diarrhea, and abdominal pain for the past 4 days. Former smoker. History of breast malignancy. EXAM: CHEST  2 VIEW COMPARISON:  PA and lateral chest x-ray of September 13, 2015 FINDINGS: The lungs are adequately inflated and clear. The heart and pulmonary vascularity are normal. There is calcification in the wall of the aortic arch. The mediastinum is normal in width. The bony thorax exhibits no acute abnormality. There postsurgical changes on the left with a breast implant present. IMPRESSION: There is no pneumonia, CHF, nor other acute cardiopulmonary abnormality. Thoracic aortic atherosclerosis. Electronically Signed   By: Broghan Pannone  [place] M.D.   On: 05/21/2016 12:49   Ct Abdomen Pelvis W Contrast  Result Date: 05/21/2016 CLINICAL DATA:  46 year old female with nausea, vomiting, diarrhea and abdominal pain since last week. Systemic lupus. Breast cancer post mastectomy. Mitral valve replacement.  Initial encounter. EXAM: CT ABDOMEN AND PELVIS WITH CONTRAST TECHNIQUE: Multidetector CT imaging of the abdomen and pelvis was performed using the standard protocol following bolus administration of intravenous contrast. CONTRAST:  57mL ISOVUE-300 IOPAMIDOL (ISOVUE-300) INJECTION 61% COMPARISON:  02/10/2013 CT abdomen and pelvis. FINDINGS: Lower chest: Minimal basilar atelectasis/ scarring. Post left mastectomy with reconstruction and implant in place. Post mitral valve replacement. Heart size within normal limits. Coronary artery calcifications. Hepatobiliary: Enlarged liver spanning over 19.5 cm. No focal hepatic lesion. Postcholecystectomy. Pancreas: Similar configuration of the pancreas. Ectatic splenic artery with coarse calcifications. Spleen: Assessory splenic tissue. Spleen prominent in the anterior-posterior direction unchanged and within normal limits. Adrenals/Urinary Tract: No renal or ureteral  obstructing stone or hydronephrosis. Left upper pole 1.4 cm cyst. Tiny low-density structures in both kidneys may be cysts although too small to characterize. No adrenal lesion. No primary urinary bladder abnormality. Stomach/Bowel: Appendicitis with ruptured appendix (appendicoliths are present). Loculated 4.5 x 4 x 3.8 cm loculated abscess surrounds/ envelops the ruptured appendix. Inflammatory process impresses upon the right fallopian tube/ adnexae and is immediately adjacent to terminal ileum. Vascular/Lymphatic: Prominent calcified plaque throughout the abdominal aorta, iliac arteries and femoral arteries with subsequent narrowing. No focal aneurysm or large vessel occlusion. Reactive lymph nodes right pelvis. Reproductive: No primary adnexal abnormality. Other: No free air. Musculoskeletal: Degenerative changes L2-3 with disc space narrowing. IMPRESSION: Appendicitis with ruptured appendix (appendicoliths are present). Loculated 4.5 x 4 x 3.8 cm loculated abscess surrounds/ envelops the ruptured appendix. Inflammatory process impresses upon the right fallopian tube/ adnexae and is immediately adjacent to terminal ileum. Prominent calcified plaque throughout the abdominal aorta, iliac arteries and femoral arteries with subsequent narrowing. Splenic artery calcifications with ectasia. Coronary artery calcifications. Enlarged liver spanning over 19.5 cm. Left upper pole 1.4 cm cyst. Tiny low-density structures in both kidneys may be cysts although too small to characterize. These results were called by telephone at the time of interpretation on 05/21/2016 at 3:47 pm to Dr. Nat Christen , who verbally acknowledged these results. Electronically Signed   By: Genia Del M.D.   On: 05/21/2016 16:09    Alcee Sipos, DO  [facility] Pager [phone]  If 7PM-7AM, please contact night-coverage [email or web address] Password [email or web address] 05/22/2016, 9:27 AM   LOS: 1 day

## 2016-05-22 NOTE — Progress Notes (Signed)
[place] for Coumadin Reversal, argatroban dosing  Indication: Mechanical Mitral Valve  No Known Allergies  Patient Measurements: Height: 5' 4"  (162.6 cm) Weight: 164 lb 4.8 oz (74.5 kg) IBW/kg (Calculated) : 54.7  Vital Signs: Temp: 99.8 F (37.7 C) (04/03 1309) Temp Source: Oral (04/03 1309) BP: 102/57 (04/03 1309) Pulse Rate: 94 (04/03 1309)  Labs:  Recent Labs  05/21/16 1130 05/21/16 1358 05/21/16 1405  05/21/16 1910 05/22/16 0535 05/22/16 1135 05/22/16 1452  HGB 13.8  --   --   --   --  10.9*  --   --   HCT 39.5  --   --   --   --  32.2*  --   --   PLT 222  --   --   --   --  194  --   --   APTT  --   --   --   < > 73* 53* 133* 150*  LABPROT  --   --  43.3*  --  25.3* 20.1*  --   --   INR  --   --  4.71*  --  2.26 1.69  --   --   CREATININE  --  1.43*  --   --   --  0.89  --   --   < > = values in this interval not displayed. Estimated Creatinine Clearance: 77.2 mL/min (by C-G formula based on SCr of 0.89 mg/dL).  Medical History: Past Medical History:  Diagnosis Date  . Abnormal Papanicolaou smear of cervix with positive human papilloma virus (HPV) test 08/17/2015  . Anticoagulation goal of INR 2.5 to 3.5   . Anxiety   . B12 deficiency   . BRCA1 negative   . BRCA2 negative   . Breast cancer ([place]) 2011   Left side  . Bursitis of right knee    Septic bursitis  . Drug-induced hepatitis    States per her rheumatologist, transaminases elevated but normalized after drug removed for   . Fibromyalgia   . 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 History of abnormal cervical Pap smear 08/12/2015  . Mitral valve disease, rheumatic    St. Jude prosthesis  . Peripheral neuropathy ([place])   . Pneumonia   . Raynaud disease   . Raynaud disease   . SLE (systemic lupus erythematosus) ([facility])    Medications Prior to Admission  Medication Sig Dispense Refill  . Albuterol (VENTOLIN IN) Inhale into  the lungs as needed.    . ALPRAZolam (XANAX) 0.5 MG tablet Take 0.5 mg by mouth at bedtime.     Jearl Klinefelter ELLIPTA 62.5-25 MCG/INH AEPB Inhale 1 puff into the lungs daily.     . cyanocobalamin (,VITAMIN B-12,) 1000 MCG/ML injection Inject 1 mL (1,000 mcg total) into the muscle every 30 (thirty) days. 10 mL prn  . dexlansoprazole (DEXILANT) 60 MG capsule Take 1 capsule (60 mg total) by mouth daily. 90 capsule 3  . furosemide (LASIX) 40 MG tablet TAKE ONE TABLET BY MOUTH ONCE DAILY. 90 tablet 3  . gabapentin (NEURONTIN) 600 MG tablet Take 600 mg by mouth 2 (two) times daily.     Marland Kitchen ibuprofen (ADVIL,MOTRIN) 800 MG tablet Take 800 mg by mouth every 6 (six) hours as needed. Reported on 08/30/2015    . levothyroxine (SYNTHROID, LEVOTHROID) 100 MCG tablet Take 1 tablet (100 mcg total) by mouth daily. 90 tablet 1  . metoCLOPramide (REGLAN) 5 MG  tablet Take 5 mg by mouth 2 (two) times daily.    Marland Kitchen oxyCODONE-acetaminophen (PERCOCET) 7.5-325 MG per tablet Take 1 tablet by mouth every 4 (four) hours as needed for pain. Reported on 08/30/2015    . potassium chloride (KLOR-CON 10) 10 MEQ tablet Take one tablet daily as directed    . riTUXimab (RITUXAN) 100 MG/10ML injection Inject into the vein. Every 4 months    . rOPINIRole (REQUIP) 0.5 MG tablet Take 1 mg by mouth at bedtime.     Marland Kitchen warfarin (COUMADIN) 5 MG tablet Take 2 tablets daily except 1 1/2 tablets on Mondays, Wednesdays and Fridays 60 tablet 6  . zolpidem (AMBIEN) 10 MG tablet Take 10 mg by mouth at bedtime as needed for sleep.     . temazepam (RESTORIL) 30 MG capsule Take 1 capsule (30 mg total) by mouth at bedtime as needed for sleep. (Patient not taking: Reported on 05/21/2016) 30 capsule 0   Assessment: INR elevated (4.71) on admission, goal INR 2.5 - 3.5,  INR now 1.69 after Vitamin K administration. Near obliterated subacute appendicitis with loculated abscess formation, complicated by elevated INR. Non-operative management preferable per surgery. Past  indication of HIT, records from [place] being obtained.  Pt started on Argatroban, initial aPTT is above target.  Goal of Therapy: Anticoagulation due to mitral valve APTT 50-90 seconds on argatroban Monitor platelets by anticoagulation protocol   Plan:  Decrease Argatroban to 18 mcg/min, check aPTT in 2 hrs APTT and CBC daily  Monitor for s/sx bleeding complications  Hart Robinsons A 05/22/2016,4:08 PM

## 2016-05-23 ENCOUNTER — Other Ambulatory Visit: Payer: Self-pay

## 2016-05-23 ENCOUNTER — Inpatient Hospital Stay (HOSPITAL_COMMUNITY): Payer: BLUE CROSS/BLUE SHIELD | Admitting: Anesthesiology

## 2016-05-23 ENCOUNTER — Encounter (HOSPITAL_COMMUNITY): Payer: Self-pay | Admitting: *Deleted

## 2016-05-23 ENCOUNTER — Encounter (HOSPITAL_COMMUNITY)
Admission: EM | Disposition: A | Discharge status: Home / Self Care | Payer: Self-pay | Source: Home / Self Care | Attending: Internal Medicine

## 2016-05-23 DIAGNOSIS — K353 Acute appendicitis with localized peritonitis: Secondary | ICD-10-CM

## 2016-05-23 DIAGNOSIS — I9589 Other hypotension: Secondary | ICD-10-CM

## 2016-05-23 DIAGNOSIS — Z01810 Encounter for preprocedural cardiovascular examination: Secondary | ICD-10-CM

## 2016-05-23 DIAGNOSIS — Z952 Presence of prosthetic heart valve: Secondary | ICD-10-CM

## 2016-05-23 DIAGNOSIS — M328 Other forms of systemic lupus erythematosus: Secondary | ICD-10-CM

## 2016-05-23 DIAGNOSIS — K3533 Acute appendicitis with perforation and localized peritonitis, with abscess: Secondary | ICD-10-CM | POA: Diagnosis present

## 2016-05-23 DIAGNOSIS — E876 Hypokalemia: Secondary | ICD-10-CM

## 2016-05-23 HISTORY — PX: APPENDECTOMY: SHX54

## 2016-05-23 LAB — COMPREHENSIVE METABOLIC PANEL
ALBUMIN: 2.4 g/dL — AB (ref 3.5–5.0)
ALK PHOS: 79 U/L (ref 38–126)
ALT: 19 U/L (ref 14–54)
ANION GAP: 7 (ref 5–15)
AST: 25 U/L (ref 15–41)
BUN: 10 mg/dL (ref 6–20)
CHLORIDE: 109 mmol/L (ref 101–111)
CO2: 22 mmol/L (ref 22–32)
Calcium: 7.8 mg/dL — ABNORMAL LOW (ref 8.9–10.3)
Creatinine, Ser: 0.69 mg/dL (ref 0.44–1.00)
GFR calc non Af Amer: >60 mL/min (ref >60)
GLUCOSE: 108 mg/dL — AB (ref 65–99)
POTASSIUM: 2.7 mmol/L — AB (ref 3.5–5.1)
SODIUM: 138 mmol/L (ref 135–145)
Total Bilirubin: 1.3 mg/dL — ABNORMAL HIGH (ref 0.3–1.2)
Total Protein: 5.6 g/dL — ABNORMAL LOW (ref 6.5–8.1)

## 2016-05-23 LAB — APTT
APTT: 64 s — AB (ref 24–36)
aPTT: 85 seconds — ABNORMAL HIGH (ref 24–36)

## 2016-05-23 LAB — CBC
HCT: 28.7 % — ABNORMAL LOW (ref 36.0–46.0)
HEMOGLOBIN: 9.6 g/dL — AB (ref 12.0–15.0)
MCH: 28.9 pg (ref 26.0–34.0)
MCHC: 33.4 g/dL (ref 30.0–36.0)
MCV: 86.4 fL (ref 78.0–100.0)
PLATELETS: 174 10*3/uL (ref 150–400)
RBC: 3.32 MIL/uL — AB (ref 3.87–5.11)
RDW: 14.2 % (ref 11.5–15.5)
WBC: 11.6 10*3/uL — AB (ref 4.0–10.5)

## 2016-05-23 LAB — HIV ANTIBODY (ROUTINE TESTING W REFLEX): HIV Screen 4th Generation wRfx: NONREACTIVE

## 2016-05-23 LAB — MRSA PCR SCREENING: MRSA by PCR: NEGATIVE

## 2016-05-23 LAB — MAGNESIUM: Magnesium: 1.5 mg/dL — ABNORMAL LOW (ref 1.7–2.4)

## 2016-05-23 SURGERY — APPENDECTOMY
Anesthesia: General | Site: Abdomen

## 2016-05-23 MED ORDER — MAGNESIUM SULFATE 2 GM/50ML IV SOLN
2.0000 g | Freq: Once | INTRAVENOUS | Status: AC
  Administered 2016-05-23: 2 g via INTRAVENOUS
  Filled 2016-05-23 (×2): qty 50

## 2016-05-23 MED ORDER — 0.9 % SODIUM CHLORIDE (POUR BTL) OPTIME
TOPICAL | Status: DC | PRN
  Administered 2016-05-23: 1000 mL

## 2016-05-23 MED ORDER — ONDANSETRON HCL 4 MG/2ML IJ SOLN
INTRAMUSCULAR | Status: AC
  Filled 2016-05-23: qty 2

## 2016-05-23 MED ORDER — SODIUM CHLORIDE 0.9 % IV BOLUS (SEPSIS)
500.0000 mL | Freq: Once | INTRAVENOUS | Status: AC
  Administered 2016-05-23: 500 mL via INTRAVENOUS

## 2016-05-23 MED ORDER — DEXTROSE 5 % IV SOLN
INTRAVENOUS | Status: DC | PRN
  Administered 2016-05-23: 50 ug/min via INTRAVENOUS

## 2016-05-23 MED ORDER — ORAL CARE MOUTH RINSE
15.0000 mL | Freq: Two times a day (BID) | OROMUCOSAL | Status: DC
  Administered 2016-05-23 – 2016-05-28 (×6): 15 mL via OROMUCOSAL

## 2016-05-23 MED ORDER — OXYCODONE-ACETAMINOPHEN 5-325 MG PO TABS
2.0000 | ORAL_TABLET | Freq: Once | ORAL | Status: AC
  Administered 2016-05-23: 2 via ORAL
  Filled 2016-05-23: qty 2

## 2016-05-23 MED ORDER — LACTATED RINGERS IV SOLN
INTRAVENOUS | Status: DC
  Administered 2016-05-23: 15:00:00 via INTRAVENOUS

## 2016-05-23 MED ORDER — POTASSIUM CHLORIDE IN NACL 40-0.9 MEQ/L-% IV SOLN
INTRAVENOUS | Status: DC
  Administered 2016-05-23 – 2016-05-26 (×5): 125 mL/h via INTRAVENOUS
  Filled 2016-05-23 (×10): qty 1000

## 2016-05-23 MED ORDER — FENTANYL CITRATE (PF) 100 MCG/2ML IJ SOLN
INTRAMUSCULAR | Status: AC
  Filled 2016-05-23: qty 2

## 2016-05-23 MED ORDER — METOCLOPRAMIDE HCL 5 MG/ML IJ SOLN
10.0000 mg | Freq: Once | INTRAMUSCULAR | Status: DC | PRN
Start: 2016-05-23 — End: 2016-05-23

## 2016-05-23 MED ORDER — SUGAMMADEX SODIUM 200 MG/2ML IV SOLN
INTRAVENOUS | Status: AC
  Filled 2016-05-23: qty 2

## 2016-05-23 MED ORDER — PIPERACILLIN-TAZOBACTAM 3.375 G IVPB
3.3750 g | Freq: Three times a day (TID) | INTRAVENOUS | Status: DC
  Administered 2016-05-23 – 2016-05-26 (×10): 3.375 g via INTRAVENOUS
  Filled 2016-05-23 (×13): qty 50

## 2016-05-23 MED ORDER — MIDAZOLAM HCL 2 MG/2ML IJ SOLN
INTRAMUSCULAR | Status: AC
  Filled 2016-05-23: qty 2

## 2016-05-23 MED ORDER — LIDOCAINE HCL (CARDIAC) 20 MG/ML IV SOLN
INTRAVENOUS | Status: DC | PRN
  Administered 2016-05-23: 80 mg via INTRATRACHEAL

## 2016-05-23 MED ORDER — FENTANYL CITRATE (PF) 100 MCG/2ML IJ SOLN
25.0000 ug | INTRAMUSCULAR | Status: DC | PRN
  Administered 2016-05-23 (×2): 50 ug via INTRAVENOUS

## 2016-05-23 MED ORDER — SUCCINYLCHOLINE CHLORIDE 20 MG/ML IJ SOLN
INTRAMUSCULAR | Status: DC | PRN
  Administered 2016-05-23: 100 mg via INTRAVENOUS

## 2016-05-23 MED ORDER — POTASSIUM CHLORIDE 2 MEQ/ML IV SOLN
INTRAVENOUS | Status: DC
  Administered 2016-05-23: 13:00:00 via INTRAVENOUS
  Filled 2016-05-23 (×8): qty 1000

## 2016-05-23 MED ORDER — PROMETHAZINE HCL 25 MG/ML IJ SOLN
INTRAMUSCULAR | Status: AC
  Filled 2016-05-23: qty 1

## 2016-05-23 MED ORDER — MIDAZOLAM HCL 5 MG/5ML IJ SOLN
INTRAMUSCULAR | Status: DC | PRN
  Administered 2016-05-23: 2 mg via INTRAVENOUS

## 2016-05-23 MED ORDER — LACTATED RINGERS IV SOLN: INTRAVENOUS | Status: DC

## 2016-05-23 MED ORDER — MEPERIDINE HCL 25 MG/ML IJ SOLN: 6.2500 mg | INTRAMUSCULAR | Status: DC | PRN

## 2016-05-23 MED ORDER — CHLORHEXIDINE GLUCONATE CLOTH 2 % EX PADS: 6.0000 | MEDICATED_PAD | Freq: Once | CUTANEOUS | Status: DC

## 2016-05-23 MED ORDER — FENTANYL CITRATE (PF) 250 MCG/5ML IJ SOLN
INTRAMUSCULAR | Status: AC
  Filled 2016-05-23: qty 5

## 2016-05-23 MED ORDER — DEXAMETHASONE SODIUM PHOSPHATE 10 MG/ML IJ SOLN
INTRAMUSCULAR | Status: AC
  Filled 2016-05-23: qty 1

## 2016-05-23 MED ORDER — NEOSTIGMINE METHYLSULFATE 5 MG/5ML IV SOSY
PREFILLED_SYRINGE | INTRAVENOUS | Status: AC
  Filled 2016-05-23: qty 5

## 2016-05-23 MED ORDER — SODIUM CHLORIDE 0.9 % IV BOLUS (SEPSIS)
1000.0000 mL | Freq: Once | INTRAVENOUS | Status: AC
  Administered 2016-05-23: 1000 mL via INTRAVENOUS

## 2016-05-23 MED ORDER — SUGAMMADEX SODIUM 200 MG/2ML IV SOLN
INTRAVENOUS | Status: DC | PRN
  Administered 2016-05-23: 200 mg via INTRAVENOUS

## 2016-05-23 MED ORDER — LIDOCAINE 2% (20 MG/ML) 5 ML SYRINGE
INTRAMUSCULAR | Status: AC
  Filled 2016-05-23: qty 10

## 2016-05-23 MED ORDER — PROPOFOL 10 MG/ML IV BOLUS
INTRAVENOUS | Status: DC | PRN
  Administered 2016-05-23: 150 mg via INTRAVENOUS

## 2016-05-23 MED ORDER — ROCURONIUM BROMIDE 100 MG/10ML IV SOLN
INTRAVENOUS | Status: DC | PRN
  Administered 2016-05-23: 40 mg via INTRAVENOUS

## 2016-05-23 MED ORDER — SODIUM CHLORIDE 0.9 % IV SOLN
30.0000 meq | Freq: Once | INTRAVENOUS | Status: AC
  Administered 2016-05-23: 30 meq via INTRAVENOUS
  Filled 2016-05-23 (×2): qty 15

## 2016-05-23 MED ORDER — SODIUM CHLORIDE 0.9% FLUSH
10.0000 mL | INTRAVENOUS | Status: DC | PRN
  Administered 2016-05-25 – 2016-05-28 (×3): 10 mL
  Administered 2016-05-29: 20 mL
  Filled 2016-05-23 (×4): qty 40

## 2016-05-23 MED ORDER — PHENYLEPHRINE HCL 10 MG/ML IJ SOLN
INTRAMUSCULAR | Status: DC | PRN
  Administered 2016-05-23: 120 ug via INTRAVENOUS

## 2016-05-23 MED ORDER — SUCCINYLCHOLINE CHLORIDE 200 MG/10ML IV SOSY
PREFILLED_SYRINGE | INTRAVENOUS | Status: AC
  Filled 2016-05-23: qty 10

## 2016-05-23 MED ORDER — FENTANYL CITRATE (PF) 250 MCG/5ML IJ SOLN
INTRAMUSCULAR | Status: DC | PRN
  Administered 2016-05-23 (×3): 50 ug via INTRAVENOUS
  Administered 2016-05-23: 100 ug via INTRAVENOUS

## 2016-05-23 SURGICAL SUPPLY — 48 items
BLADE CLIPPER SURG (BLADE) ×1 IMPLANT
BNDG GAUZE ELAST 4 BULKY (GAUZE/BANDAGES/DRESSINGS) ×1 IMPLANT
CANISTER SUCT 3000ML PPV (MISCELLANEOUS) ×2 IMPLANT
CHLORAPREP W/TINT 26ML (MISCELLANEOUS) ×2 IMPLANT
COVER SURGICAL LIGHT HANDLE (MISCELLANEOUS) ×2 IMPLANT
DRAPE LAPAROTOMY T 102X78X121 (DRAPES) ×2 IMPLANT
DRAPE UTILITY XL STRL (DRAPES) ×4 IMPLANT
DRSG PAD ABDOMINAL 8X10 ST (GAUZE/BANDAGES/DRESSINGS) ×2 IMPLANT
ELECT REM PT RETURN 9FT ADLT (ELECTROSURGICAL) ×2
ELECTRODE REM PT RTRN 9FT ADLT (ELECTROSURGICAL) ×1 IMPLANT
GAUZE SPONGE 4X4 12PLY STRL (GAUZE/BANDAGES/DRESSINGS) ×2 IMPLANT
GLOVE BIOGEL PI IND STRL 7.0 (GLOVE) IMPLANT
GLOVE BIOGEL PI INDICATOR 7.0 (GLOVE) ×1
GLOVE EUDERMIC 7 POWDERFREE (GLOVE) ×2 IMPLANT
GLOVE SURG SS PI 6.5 STRL IVOR (GLOVE) ×2 IMPLANT
GOWN STRL REUS W/ TWL LRG LVL3 (GOWN DISPOSABLE) IMPLANT
GOWN STRL REUS W/ TWL XL LVL3 (GOWN DISPOSABLE) ×1 IMPLANT
GOWN STRL REUS W/TWL LRG LVL3 (GOWN DISPOSABLE) ×4
GOWN STRL REUS W/TWL XL LVL3 (GOWN DISPOSABLE) ×2
KIT BASIN OR (CUSTOM PROCEDURE TRAY) ×2 IMPLANT
KIT ROOM TURNOVER OR (KITS) ×2 IMPLANT
NS IRRIG 1000ML POUR BTL (IV SOLUTION) ×2 IMPLANT
PACK GENERAL/GYN (CUSTOM PROCEDURE TRAY) ×2 IMPLANT
PAD ARMBOARD 7.5X6 YLW CONV (MISCELLANEOUS) ×4 IMPLANT
SHEARS HARMONIC 23CM COAG (MISCELLANEOUS) ×1 IMPLANT
SPECIMEN JAR SMALL (MISCELLANEOUS) ×1 IMPLANT
SPONGE LAP 4X18 X RAY DECT (DISPOSABLE) ×4 IMPLANT
STAPLER PROXIMATE 55 BLUE (STAPLE) ×1 IMPLANT
STAPLER VISISTAT 35W (STAPLE) ×1 IMPLANT
SUCTION POOLE TIP (SUCTIONS) ×1 IMPLANT
SUT CHROMIC 2 0 SH (SUTURE) IMPLANT
SUT CHROMIC 2 0 TIES 18 (SUTURE) ×1 IMPLANT
SUT CHROMIC GUT AB #0 18 (SUTURE) ×1 IMPLANT
SUT PDS AB 1 TP1 96 (SUTURE) ×2 IMPLANT
SUT PDS AB 2-0 CT1 27 (SUTURE) ×2 IMPLANT
SUT SILK 3 0 (SUTURE) ×2
SUT SILK 3 0 SH 30 (SUTURE) ×2 IMPLANT
SUT SILK 3-0 18XBRD TIE 12 (SUTURE) ×1 IMPLANT
SUT VIC AB 0 CT1 27 (SUTURE) ×2
SUT VIC AB 0 CT1 27XBRD ANBCTR (SUTURE) ×3 IMPLANT
SWAB COLLECTION DEVICE MRSA (MISCELLANEOUS) IMPLANT
SYR 10ML LL (SYRINGE) ×1 IMPLANT
TAPE CLOTH SURG 4X10 WHT LF (GAUZE/BANDAGES/DRESSINGS) ×1 IMPLANT
TOWEL OR 17X24 6PK STRL BLUE (TOWEL DISPOSABLE) ×2 IMPLANT
TOWEL OR 17X26 10 PK STRL BLUE (TOWEL DISPOSABLE) ×2 IMPLANT
TRAY FOLEY CATH SILVER 14FR (SET/KITS/TRAYS/PACK) ×1 IMPLANT
TUBE ANAEROBIC SPECIMEN COL (MISCELLANEOUS) ×2 IMPLANT
WATER STERILE IRR 1000ML POUR (IV SOLUTION) ×1 IMPLANT

## 2016-05-23 NOTE — Progress Notes (Addendum)
PROGRESS NOTE                                                                                                                                                                                                             Patient Demographics:    [name], is a 46 y.o. female, DOB - [birth year], VHQ:[id number]  Admit date - 05/21/2016   Admitting Physician Roney Jaffe, MD  Outpatient Primary MD for the patient is Purvis Kilts, MD  LOS - 2  Outpatient Specialists:Cardiology  Chief Complaint  Patient presents with  . Abdominal Pain  . 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 She initially presented to an in-hospital with CT of the abdomen and pelvis showed up inside his with rupture and associated4.5 x 4 x 3.8 cm loculated abscess enveloping the ruptured appendix. Surgery was consulted who recommended I consultation for percutaneous drainage. Patient was transferred to [facility]. IR evaluated the patient and recommended that there was no window for percutaneous drainage of her small abscess. Patient taken to OR for operative repair.   Subjective:   Patient hypotensive with systolic blood pressure in the 80s overnight (received total 2 (liters normal saline bolus followed by maintenance fluid.) Denied any symptoms besides abdominal pain and reports systolic blood pressure is chronically in the 90s.   Assessment  & Plan :    Principal Problem:   Sepsis secondary to Appendicitis with abscess Continue empiric IV Zosyn. Aggressive IV hydration (required IV normal saline bolus followed by maintenance fluid ).  MAP>65. Taken to OR this afternoon for appendectomy. Pain control postop.  Active Problems:   History of mitral valve replacement with mechanical valve Seen by cardiology for preop clearance. On long-term anticoagulation with Argotraban (held shortly for surgery and resume postop).  UTI On empiric antibiotics.  Acute kidney injury Secondary to sepsis and dehydration. Improved with IV fluids.  Hypokalemia/hypomagnesemia Replenished  Systemic lupus erythematosus -Patient is next due for Rituxan on 06/29/2016 -follows Dr. Berna Bue -intolerant to steroids -eye toxicity on plaquenil  Chronic pain -IV hydromorphone  PRN  Autoimmune hepatitis  -Appears clinically compensated  Hypothyroidism -Continue Synthroid    Code Status : Full code  Family Communication  : Mother at bedside  Disposition Plan  : Home once improved  Barriers For Discharge : Active symptoms  Consults  :   [place] [facility]  Procedures  :  CT abdomen and pelvis Appendectomy  DVT Prophylaxis  : Systemic anticoagulation  Lab Results  Component Value Date   PLT 174 05/23/2016    Antibiotics  :    Anti-infectives    Start     Dose/Rate Route Frequency Ordered Stop   05/21/16 1600  [MAR Hold]  piperacillin-tazobactam (ZOSYN) IVPB 3.375 g     (MAR Hold since 05/23/16 1418)   3.375 g 12.5 mL/hr over 240 Minutes Intravenous Every 8 hours 05/21/16 1556          Objective:   Vitals:   05/23/16 0652 05/23/16 0910 05/23/16 0935 05/23/16 1309  BP: (!) 87/58 (!) 85/60 (!) 82/62 (!) 88/55  Pulse: 75 77    Resp: 18     Temp: 99.1 F (37.3 C)   99.5 F (37.5 C)  TempSrc: Oral   Oral  SpO2: 99%     Weight: 74.9 kg (165 lb 2 oz)     Height:        Wt Readings from Last 3 Encounters:  05/23/16 74.9 kg (165 lb 2 oz)  04/18/16 78.9 kg (174 lb)  03/22/16 75.8 kg (167 lb)     Intake/Output Summary (Last 24 hours) at 05/23/16 1546 Last data filed at 05/23/16 0600  Gross  per 24 hour  Intake           649.39 ml  Output              601 ml  Net            48.39 ml     Physical Exam  Gen: not in distress HEENT: moist mucosa, supple neck Chest: clear b/l, no added sounds CVS:s1 Click, normal S2, no murmurs GI: soft,  ND, BS+, Tender right lower quadrant Musculoskeletal: warm, no edema     Data Review:    CBC  Recent Labs Lab 05/21/16 1130 05/22/16 0535 05/23/16 0712  WBC 21.5* 12.8* 11.6*  HGB 13.8 10.9* 9.6*  HCT 39.5 32.2* 28.7*  PLT 222 194 174  MCV 86.6 87.3 86.4  MCH 30.3 29.5 28.9  MCHC 34.9 33.9 33.4  RDW 14.2 14.1 14.2    Chemistries   Recent Labs Lab 05/21/16 1358 05/22/16 0535 05/23/16 0712  NA 135 139 138  K 3.0* 3.0* 2.7*  CL 102 109 109  CO2 19* 22 22  GLUCOSE 88 95 108*  BUN 32* 21* 10  CREATININE 1.43* 0.89 0.69  CALCIUM 8.7* 8.5* 7.8*  MG  --   --  1.5*  AST 22 19 25   ALT 16 16 19   ALKPHOS 101 91 79  BILITOT 1.1 1.5* 1.3*   ------------------------------------------------------------------------------------------------------------------ No results for input(s): CHOL, HDL, LDLCALC, TRIG, CHOLHDL, LDLDIRECT in the last 72 hours.  No results found for: HGBA1C ------------------------------------------------------------------------------------------------------------------ No results for input(s): TSH, T4TOTAL, T3FREE, THYROIDAB in the last 72 hours.  Invalid input(s): FREET3 ------------------------------------------------------------------------------------------------------------------ No results for input(s): VITAMINB12, FOLATE, FERRITIN, TIBC, IRON, RETICCTPCT in the last 72 hours.  Coagulation profile  Recent Labs Lab 05/21/16 1405 05/21/16 1910 05/22/16 0535  INR 4.71* 2.26 1.69    No results for input(s): DDIMER in the last 72 hours.  Cardiac Enzymes No results for input(s): CKMB, TROPONINI, MYOGLOBIN in the last 168 hours.  Invalid input(s):  CK ------------------------------------------------------------------------------------------------------------------ No results found for: BNP  Inpatient Medications  Scheduled Meds: . Chlorhexidine Gluconate Cloth  6 each Topical Once   And  . Chlorhexidine Gluconate Cloth  6 each Topical Once  . [MAR Hold] levothyroxine  100 mcg Oral QAC breakfast  . [MAR Hold] mouth rinse  15 mL Mouth Rinse BID  . [MAR Hold] piperacillin-tazobactam (ZOSYN)  IV  3.375 g Intravenous Q8H  . [MAR Hold] rOPINIRole  1 mg Oral QHS  . [MAR Hold] sodium chloride  1,000 mL Intravenous Once  . [MAR Hold] umeclidinium-vilanterol  1 puff Inhalation Daily   Continuous Infusions: . lactated ringers 10 mL/hr at 05/23/16 1437  . sodium chloride 0.9 % 1,000 mL with potassium chloride 40 mEq infusion 125 mL/hr at 05/23/16 1238   PRN Meds:.[MAR Hold] acetaminophen **OR** [MAR Hold] acetaminophen, [MAR Hold]  HYDROmorphone (DILAUDID) injection, [MAR Hold] LORazepam, [MAR Hold] promethazine, [MAR Hold] sodium chloride flush  Micro Results Recent Results (from the past 240 hour(s))  MRSA PCR Screening     Status: None   Collection Time: 05/23/16 12:57 PM  Result Value Ref Range Status   MRSA by PCR NEGATIVE NEGATIVE Final    Comment:        The GeneXpert MRSA Assay (FDA approved for NASAL specimens only), is one component of a comprehensive MRSA colonization surveillance program. It is not intended to diagnose MRSA infection nor to guide or monitor treatment for MRSA infections.     Radiology Reports Dg Chest 2 View  Result Date: 05/21/2016 CLINICAL DATA:  Nausea, vomiting, diarrhea, and abdominal pain for the past 4 days. Former smoker. History of breast malignancy. EXAM: CHEST  2 VIEW COMPARISON:  PA and lateral chest x-ray of September 13, 2015 FINDINGS: The lungs are adequately inflated and clear. The heart and pulmonary vascularity are normal. There is calcification in the wall of the aortic arch. The  mediastinum is normal in width. The bony thorax exhibits no acute abnormality. There postsurgical changes on the left with a breast implant present. IMPRESSION: There is no pneumonia, CHF, nor other acute cardiopulmonary abnormality. Thoracic aortic atherosclerosis. Electronically Signed   By: David  Martinique M.D.   On: 05/21/2016 12:49   Ct Abdomen Pelvis W Contrast  Result Date: 05/21/2016 CLINICAL DATA:  46 year old female with nausea, vomiting, diarrhea and abdominal pain since last week. Systemic lupus. Breast cancer post mastectomy. Mitral valve replacement. Initial encounter. EXAM: CT ABDOMEN AND PELVIS WITH CONTRAST TECHNIQUE: Multidetector CT imaging of the abdomen and pelvis was performed using the standard protocol following bolus administration of intravenous contrast. CONTRAST:  50mL ISOVUE-300 IOPAMIDOL (ISOVUE-300) INJECTION 61% COMPARISON:  02/10/2013 CT abdomen and pelvis. FINDINGS: Lower chest: Minimal basilar atelectasis/ scarring. Post left mastectomy with reconstruction and implant in place. Post mitral valve replacement. Heart size within normal limits. Coronary artery calcifications. Hepatobiliary: Enlarged liver spanning over 19.5 cm. No focal hepatic lesion. Postcholecystectomy. Pancreas: Similar configuration of the pancreas. Ectatic splenic artery with coarse calcifications. Spleen: Assessory splenic tissue. Spleen prominent in the anterior-posterior direction unchanged and within normal limits. Adrenals/Urinary Tract: No renal or ureteral obstructing stone or hydronephrosis. Left upper pole 1.4 cm cyst. Tiny low-density structures in both kidneys may be cysts although too small to characterize. No adrenal lesion. No primary urinary bladder abnormality. Stomach/Bowel: Appendicitis with ruptured appendix (appendicoliths are present). Loculated 4.5 x 4 x 3.8 cm loculated abscess surrounds/ envelops the  ruptured appendix. Inflammatory process impresses upon the right fallopian tube/ adnexae  and is immediately adjacent to terminal ileum. Vascular/Lymphatic: Prominent calcified plaque throughout the abdominal aorta, iliac arteries and femoral arteries with subsequent narrowing. No focal aneurysm or large vessel occlusion. Reactive lymph nodes right pelvis. Reproductive: No primary adnexal abnormality. Other: No free air. Musculoskeletal: Degenerative changes L2-3 with disc space narrowing. IMPRESSION: Appendicitis with ruptured appendix (appendicoliths are present). Loculated 4.5 x 4 x 3.8 cm loculated abscess surrounds/ envelops the ruptured appendix. Inflammatory process impresses upon the right fallopian tube/ adnexae and is immediately adjacent to terminal ileum. Prominent calcified plaque throughout the abdominal aorta, iliac arteries and femoral arteries with subsequent narrowing. Splenic artery calcifications with ectasia. Coronary artery calcifications. Enlarged liver spanning over 19.5 cm. Left upper pole 1.4 cm cyst. Tiny low-density structures in both kidneys may be cysts although too small to characterize. These results were called by telephone at the time of interpretation on 05/21/2016 at 3:47 pm to Dr. Nat Christen , who verbally acknowledged these results. Electronically Signed   By: Genia Del M.D.   On: 05/21/2016 16:09    Time Spent in minutes  35   Louellen Molder M.D on 05/23/2016 at 3:46 PM  Between 7am to 7pm - Pager - [phone]  After 7pm go to [email or web address] - password [facility]  [facility] -  Office  [phone]

## 2016-05-23 NOTE — Anesthesia Postprocedure Evaluation (Signed)
Anesthesia Post Note  Patient: [name]  Procedure(s) Performed: Procedure(s) (LRB): OPEN APPENDECTOMY WITH DRAINAGE OF ABSCESS (N/A)  Patient location during evaluation: PACU Anesthesia Type: General Level of consciousness: sedated and patient cooperative Pain management: pain level controlled Vital Signs Assessment: post-procedure vital signs reviewed and stable Respiratory status: spontaneous breathing Cardiovascular status: stable Anesthetic complications: no       Last Vitals:  Vitals:   05/23/16 1716 05/23/16 1736  BP: (!) 158/51 (!) 144/64  Pulse: 89 88  Resp: 14   Temp: (!) 38.1 C 37.7 C    Last Pain:  Vitals:   05/23/16 1736  TempSrc: Oral  PainSc:                  Nolon Nations

## 2016-05-23 NOTE — Transfer of Care (Signed)
Immediate Anesthesia Transfer of Care Note  Patient: [name]  Procedure(s) Performed: Procedure(s): OPEN APPENDECTOMY WITH DRAINAGE OF ABSCESS (N/A)  Patient Location: PACU  Anesthesia Type:General  Level of Consciousness: awake  Airway & Oxygen Therapy: Patient Spontanous Breathing and Patient connected to nasal cannula oxygen  Post-op Assessment: Report given to RN and Post -op Vital signs reviewed and stable  Post vital signs: Reviewed and stable  Last Vitals:  Vitals:   05/23/16 1309 05/23/16 1644  BP: (!) 88/55   Pulse:    Resp:    Temp: 37.5 C (!) 39.3 C    Last Pain:  Vitals:   05/23/16 1644  TempSrc:   PainSc: Asleep      Patients Stated Pain Goal: 3 (10/02/86 7195)  Complications: No apparent anesthesia complications

## 2016-05-23 NOTE — Anesthesia Preprocedure Evaluation (Signed)
Anesthesia Evaluation  Patient identified by MRN, date of birth, ID band Patient awake    Reviewed: Allergy & Precautions, H&P , NPO status , Patient's Chart, lab work & pertinent test results  History of Anesthesia Complications (+) PONV  Airway Mallampati: I  TM Distance: >3 FB Neck ROM: Full    Dental  (+) Teeth Intact   Pulmonary former smoker,    breath sounds clear to auscultation       Cardiovascular +CHF  + Valvular Problems/Murmurs (s/p MVR [facility] Judes) MR  Rhythm:Regular Rate:Normal + Systolic Click 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 Jude)   Neuro/Psych Anxiety  Neuromuscular disease    GI/Hepatic Neg liver ROS, GERD  Medicated,  Endo/Other  negative endocrine ROS  Renal/GU negative Renal ROS  negative genitourinary   Musculoskeletal  (+) Fibromyalgia -SLE   Abdominal (+) + obese,   Peds negative pediatric ROS (+)  Hematology negative hematology ROS (+)   Anesthesia Other Findings   Reproductive/Obstetrics negative OB ROS                             Anesthesia Physical  Anesthesia Plan  ASA: III  Anesthesia Plan: General   Post-op Pain Management:    Induction: Intravenous  Airway Management Planned: Oral ETT  Additional Equipment:   Intra-op Plan:   Post-operative Plan: Extubation in OR  Informed Consent: I have reviewed the patients History and Physical, chart, labs and discussed the procedure including the risks, benefits and alternatives for the proposed anesthesia with the patient or authorized representative who has indicated his/her understanding and acceptance.   Dental advisory given  Plan Discussed with: CRNA and Surgeon  Anesthesia Plan Comments:         Anesthesia Quick Evaluation

## 2016-05-23 NOTE — H&P (View-Only) (Signed)
Subjective: Alert and cooperative. Spiked fever to 103.1.  Currently 99 1.  Heart rate 75.  BP 87/58, but she says it runs low. Morning lab work pending Complains of right lower quadrant and suprapubic pain Complains of daily vomiting Had some diarrhea  I have discussed her care with Dr. Carles Collet of [facility] yesterday.  I have reviewed her CT scan and lab work.  I have discussed her CT findings with Dr. Fredric Mare in radiology this morning.  There does not appear to be a window for percutaneous drainage of her small abscess.  Right gluteal approach wouldn't traverse sciatic nerve and internal iliac vessels and seems high risk.  I have proposed open appendectomy and drainage of abscess today and she agrees.  Objective: Vital signs in last 24 hours: Temp:  [98.2 F (36.8 C)-103.1 F (39.5 C)] 99.1 F (37.3 C) (04/04 0652) Pulse Rate:  [75-94] 75 (04/04 0652) Resp:  [18-20] 18 (04/04 0652) BP: (84-102)/(55-62) 87/58 (04/04 0652) SpO2:  [91 %-100 %] 99 % (04/04 0652) Weight:  [71 kg (156 lb 8.4 oz)-74.9 kg (165 lb 2 oz)] 74.9 kg (165 lb 2 oz) (04/04 6546) Last BM Date: 05/21/16  Intake/Output from previous day: 04/03 0701 - 04/04 0700 In: 1618.7 [P.O.:640; I.V.:928.7; IV Piggyback:50] Out: 601 [Urine:601] Intake/Output this shift: No intake/output data recorded.  General appearance: Alert and cooperative.  Mental status normal.  Mother present in room.  Does not look toxic Resp: clear to auscultation bilaterally Cardio: Regular rate and rhythm GI: Soft.  Not distended.  Tender with guarding right lower quadrant and suprapubic area.  No mass.  Lab Results:   Recent Labs  05/21/16 1130 05/22/16 0535  WBC 21.5* 12.8*  HGB 13.8 10.9*  HCT 39.5 32.2*  PLT 222 194   BMET  Recent Labs  05/21/16 1358 05/22/16 0535  NA 135 139  K 3.0* 3.0*  CL 102 109  CO2 19* 22  GLUCOSE 88 95  BUN 32* 21*  CREATININE 1.43* 0.89  CALCIUM 8.7* 8.5*   PT/INR  Recent Labs   05/21/16 1910 05/22/16 0535  LABPROT 25.3* 20.1*  INR 2.26 1.69   ABG No results for input(s): PHART, HCO3 in the last 72 hours.  Invalid input(s): PCO2, PO2  Studies/Results: Dg Chest 2 View  Result Date: 05/21/2016 CLINICAL DATA:  Nausea, vomiting, diarrhea, and abdominal pain for the past 4 days. Former smoker. History of breast malignancy. EXAM: CHEST  2 VIEW COMPARISON:  PA and lateral chest x-ray of September 13, 2015 FINDINGS: The lungs are adequately inflated and clear. The heart and pulmonary vascularity are normal. There is calcification in the wall of the aortic arch. The mediastinum is normal in width. The bony thorax exhibits no acute abnormality. There postsurgical changes on the left with a breast implant present. IMPRESSION: There is no pneumonia, CHF, nor other acute cardiopulmonary abnormality. Thoracic aortic atherosclerosis. Electronically Signed   By: David  Martinique M.D.   On: 05/21/2016 12:49   Ct Abdomen Pelvis W Contrast  Result Date: 05/21/2016 CLINICAL DATA:  46 year old female with nausea, vomiting, diarrhea and abdominal pain since last week. Systemic lupus. Breast cancer post mastectomy. Mitral valve replacement. Initial encounter. EXAM: CT ABDOMEN AND PELVIS WITH CONTRAST TECHNIQUE: Multidetector CT imaging of the abdomen and pelvis was performed using the standard protocol following bolus administration of intravenous contrast. CONTRAST:  89mL ISOVUE-300 IOPAMIDOL (ISOVUE-300) INJECTION 61% COMPARISON:  02/10/2013 CT abdomen and pelvis. FINDINGS: Lower chest: Minimal basilar atelectasis/ scarring. Post left mastectomy with  reconstruction and implant in place. Post mitral valve replacement. Heart size within normal limits. Coronary artery calcifications. Hepatobiliary: Enlarged liver spanning over 19.5 cm. No focal hepatic lesion. Postcholecystectomy. Pancreas: Similar configuration of the pancreas. Ectatic splenic artery with coarse calcifications. Spleen: Assessory splenic  tissue. Spleen prominent in the anterior-posterior direction unchanged and within normal limits. Adrenals/Urinary Tract: No renal or ureteral obstructing stone or hydronephrosis. Left upper pole 1.4 cm cyst. Tiny low-density structures in both kidneys may be cysts although too small to characterize. No adrenal lesion. No primary urinary bladder abnormality. Stomach/Bowel: Appendicitis with ruptured appendix (appendicoliths are present). Loculated 4.5 x 4 x 3.8 cm loculated abscess surrounds/ envelops the ruptured appendix. Inflammatory process impresses upon the right fallopian tube/ adnexae and is immediately adjacent to terminal ileum. Vascular/Lymphatic: Prominent calcified plaque throughout the abdominal aorta, iliac arteries and femoral arteries with subsequent narrowing. No focal aneurysm or large vessel occlusion. Reactive lymph nodes right pelvis. Reproductive: No primary adnexal abnormality. Other: No free air. Musculoskeletal: Degenerative changes L2-3 with disc space narrowing. IMPRESSION: Appendicitis with ruptured appendix (appendicoliths are present). Loculated 4.5 x 4 x 3.8 cm loculated abscess surrounds/ envelops the ruptured appendix. Inflammatory process impresses upon the right fallopian tube/ adnexae and is immediately adjacent to terminal ileum. Prominent calcified plaque throughout the abdominal aorta, iliac arteries and femoral arteries with subsequent narrowing. Splenic artery calcifications with ectasia. Coronary artery calcifications. Enlarged liver spanning over 19.5 cm. Left upper pole 1.4 cm cyst. Tiny low-density structures in both kidneys may be cysts although too small to characterize. These results were called by telephone at the time of interpretation on 05/21/2016 at 3:47 pm to Dr. Nat Christen , who verbally acknowledged these results. Electronically Signed   By: Genia Del M.D.   On: 05/21/2016 16:09    Anti-infectives: Anti-infectives    Start     Dose/Rate Route Frequency  Ordered Stop   05/21/16 1600  piperacillin-tazobactam (ZOSYN) IVPB 3.375 g     3.375 g 12.5 mL/hr over 240 Minutes Intravenous Every 8 hours 05/21/16 1556        Assessment/Plan:   Ruptured appendicitis with abscess.  Now 6 days intercourse. Continues febrile and symptomatic despite antibiotics No window for interventional radiology to drain Therefore best treatment algorithm is to perform open appendectomy and drainage of abscess under control circumstances  I discussed the indications, details, techniques, and numerous risk of the surgery with her and her mother.  They're aware the risk of bleeding, infection, wound healing problems such as hernia, dehiscence or infection.  They're aware that we will be left open.  Also risk of injury to adjacent organs with major reconstructive surgery.  Cardiac pulmonary and thromboembolic problems.  The understand these issues well.  All his questions are answered.  They agree with this plan Hopefully we'll be able to take her to the operating room this afternoon if the schedule will allow  History St. Jude MVR.  Rheumatic.  Followed by Dr. Domenic Polite.  Chronically on Coumadin.  Currently being bridged with IV argatroban.  We will ask cardiology for formal consultation to coordinate care. Plan to discontinue argatroban 1 hour preop. History HIT History L.breast cancer (Dr Lucia Gaskins) SLE-rituxan Raynauds cholecystectomy   LOS: 2 days    Mana Haberl M 05/23/2016

## 2016-05-23 NOTE — Progress Notes (Signed)
Patient blood pressure 87/58 after third 500 cc normal saline bolus.Patient asymptomatic.Text paged Dr.Dhungel.

## 2016-05-23 NOTE — Progress Notes (Signed)
Patient complaining of abdominal pain 8/10 and blood pressure 86/51. Text paged Chaney Malling NP.Order received for 500 cc normal saline bolus and one tome order for 2 percocet for pain. Will continue to monitor.

## 2016-05-23 NOTE — Progress Notes (Signed)
After completion of 500 cc normal saline bolus patient blood pressure rechecked 84/59.Patient asymptomatic.Text paged [name] Malling NP.

## 2016-05-23 NOTE — Progress Notes (Signed)
Patient blood pressure 85/62 after second 500 cc normal saline bolus.Patient asymptomatic .Patient complains of abdominal pain 7/10 requesting pain medication.Text paged Chaney Malling  NP .

## 2016-05-23 NOTE — Progress Notes (Signed)
New order received for 500 cc normal saline bolus.Will continue to monitor.

## 2016-05-23 NOTE — Progress Notes (Signed)
Dr.Kinsinger informed of patient arrival to unit [place] bed 26.

## 2016-05-23 NOTE — Progress Notes (Signed)
Subjective: Alert and cooperative. Spiked fever to 103.1.  Currently 99 1.  Heart rate 75.  BP 87/58, but she says it runs low. Morning lab work pending Complains of right lower quadrant and suprapubic pain Complains of daily vomiting Had some diarrhea  I have discussed her care with Dr. Carles Collet of [facility] yesterday.  I have reviewed her CT scan and lab work.  I have discussed her CT findings with Dr. Fredric Mare in radiology this morning.  There does not appear to be a window for percutaneous drainage of her small abscess.  Right gluteal approach wouldn't traverse sciatic nerve and internal iliac vessels and seems high risk.  I have proposed open appendectomy and drainage of abscess today and she agrees.  Objective: Vital signs in last 24 hours: Temp:  [98.2 F (36.8 C)-103.1 F (39.5 C)] 99.1 F (37.3 C) (04/04 0652) Pulse Rate:  [75-94] 75 (04/04 0652) Resp:  [18-20] 18 (04/04 0652) BP: (84-102)/(55-62) 87/58 (04/04 0652) SpO2:  [91 %-100 %] 99 % (04/04 0652) Weight:  [71 kg (156 lb 8.4 oz)-74.9 kg (165 lb 2 oz)] 74.9 kg (165 lb 2 oz) (04/04 8889) Last BM Date: 05/21/16  Intake/Output from previous day: 04/03 0701 - 04/04 0700 In: 1618.7 [P.O.:640; I.V.:928.7; IV Piggyback:50] Out: 601 [Urine:601] Intake/Output this shift: No intake/output data recorded.  General appearance: Alert and cooperative.  Mental status normal.  Mother present in room.  Does not look toxic Resp: clear to auscultation bilaterally Cardio: Regular rate and rhythm GI: Soft.  Not distended.  Tender with guarding right lower quadrant and suprapubic area.  No mass.  Lab Results:   Recent Labs  05/21/16 1130 05/22/16 0535  WBC 21.5* 12.8*  HGB 13.8 10.9*  HCT 39.5 32.2*  PLT 222 194   BMET  Recent Labs  05/21/16 1358 05/22/16 0535  NA 135 139  K 3.0* 3.0*  CL 102 109  CO2 19* 22  GLUCOSE 88 95  BUN 32* 21*  CREATININE 1.43* 0.89  CALCIUM 8.7* 8.5*   PT/INR  Recent Labs   05/21/16 1910 05/22/16 0535  LABPROT 25.3* 20.1*  INR 2.26 1.69   ABG No results for input(s): PHART, HCO3 in the last 72 hours.  Invalid input(s): PCO2, PO2  Studies/Results: Dg Chest 2 View  Result Date: 05/21/2016 CLINICAL DATA:  Nausea, vomiting, diarrhea, and abdominal pain for the past 4 days. Former smoker. History of breast malignancy. EXAM: CHEST  2 VIEW COMPARISON:  PA and lateral chest x-ray of September 13, 2015 FINDINGS: The lungs are adequately inflated and clear. The heart and pulmonary vascularity are normal. There is calcification in the wall of the aortic arch. The mediastinum is normal in width. The bony thorax exhibits no acute abnormality. There postsurgical changes on the left with a breast implant present. IMPRESSION: There is no pneumonia, CHF, nor other acute cardiopulmonary abnormality. Thoracic aortic atherosclerosis. Electronically Signed   By: David  Martinique M.D.   On: 05/21/2016 12:49   Ct Abdomen Pelvis W Contrast  Result Date: 05/21/2016 CLINICAL DATA:  46 year old female with nausea, vomiting, diarrhea and abdominal pain since last week. Systemic lupus. Breast cancer post mastectomy. Mitral valve replacement. Initial encounter. EXAM: CT ABDOMEN AND PELVIS WITH CONTRAST TECHNIQUE: Multidetector CT imaging of the abdomen and pelvis was performed using the standard protocol following bolus administration of intravenous contrast. CONTRAST:  51mL ISOVUE-300 IOPAMIDOL (ISOVUE-300) INJECTION 61% COMPARISON:  02/10/2013 CT abdomen and pelvis. FINDINGS: Lower chest: Minimal basilar atelectasis/ scarring. Post left mastectomy with  reconstruction and implant in place. Post mitral valve replacement. Heart size within normal limits. Coronary artery calcifications. Hepatobiliary: Enlarged liver spanning over 19.5 cm. No focal hepatic lesion. Postcholecystectomy. Pancreas: Similar configuration of the pancreas. Ectatic splenic artery with coarse calcifications. Spleen: Assessory splenic  tissue. Spleen prominent in the anterior-posterior direction unchanged and within normal limits. Adrenals/Urinary Tract: No renal or ureteral obstructing stone or hydronephrosis. Left upper pole 1.4 cm cyst. Tiny low-density structures in both kidneys may be cysts although too small to characterize. No adrenal lesion. No primary urinary bladder abnormality. Stomach/Bowel: Appendicitis with ruptured appendix (appendicoliths are present). Loculated 4.5 x 4 x 3.8 cm loculated abscess surrounds/ envelops the ruptured appendix. Inflammatory process impresses upon the right fallopian tube/ adnexae and is immediately adjacent to terminal ileum. Vascular/Lymphatic: Prominent calcified plaque throughout the abdominal aorta, iliac arteries and femoral arteries with subsequent narrowing. No focal aneurysm or large vessel occlusion. Reactive lymph nodes right pelvis. Reproductive: No primary adnexal abnormality. Other: No free air. Musculoskeletal: Degenerative changes L2-3 with disc space narrowing. IMPRESSION: Appendicitis with ruptured appendix (appendicoliths are present). Loculated 4.5 x 4 x 3.8 cm loculated abscess surrounds/ envelops the ruptured appendix. Inflammatory process impresses upon the right fallopian tube/ adnexae and is immediately adjacent to terminal ileum. Prominent calcified plaque throughout the abdominal aorta, iliac arteries and femoral arteries with subsequent narrowing. Splenic artery calcifications with ectasia. Coronary artery calcifications. Enlarged liver spanning over 19.5 cm. Left upper pole 1.4 cm cyst. Tiny low-density structures in both kidneys may be cysts although too small to characterize. These results were called by telephone at the time of interpretation on 05/21/2016 at 3:47 pm to Dr. Nat Christen , who verbally acknowledged these results. Electronically Signed   By: Genia Del M.D.   On: 05/21/2016 16:09    Anti-infectives: Anti-infectives    Start     Dose/Rate Route Frequency  Ordered Stop   05/21/16 1600  piperacillin-tazobactam (ZOSYN) IVPB 3.375 g     3.375 g 12.5 mL/hr over 240 Minutes Intravenous Every 8 hours 05/21/16 1556        Assessment/Plan:   Ruptured appendicitis with abscess.  Now 6 days intercourse. Continues febrile and symptomatic despite antibiotics No window for interventional radiology to drain Therefore best treatment algorithm is to perform open appendectomy and drainage of abscess under control circumstances  I discussed the indications, details, techniques, and numerous risk of the surgery with her and her mother.  They're aware the risk of bleeding, infection, wound healing problems such as hernia, dehiscence or infection.  They're aware that we will be left open.  Also risk of injury to adjacent organs with major reconstructive surgery.  Cardiac pulmonary and thromboembolic problems.  The understand these issues well.  All his questions are answered.  They agree with this plan Hopefully we'll be able to take her to the operating room this afternoon if the schedule will allow  History St. Jude MVR.  Rheumatic.  Followed by Dr. Domenic Polite.  Chronically on Coumadin.  Currently being bridged with IV argatroban.  We will ask cardiology for formal consultation to coordinate care. Plan to discontinue argatroban 1 hour preop. History HIT History L.breast cancer (Dr Lucia Gaskins) SLE-rituxan Raynauds cholecystectomy   LOS: 2 days     [place] M 05/23/2016

## 2016-05-23 NOTE — Progress Notes (Signed)
[place] for Argatroban Indication: Mechanical Mitral Valve, ?HIT history  No Known Allergies  Patient Measurements: Height: 5\' 4"  (162.6 cm) Weight: 165 lb 2 oz (74.9 kg) IBW/kg (Calculated) : 54.7  Vital Signs: Temp: 99.1 F (37.3 C) (04/04 0652) Temp Source: Oral (04/04 0652) BP: 87/58 (04/04 0652) Pulse Rate: 75 (04/04 0652)  Labs:  Recent Labs  05/21/16 1130 05/21/16 1358 05/21/16 1405  05/21/16 1910 05/22/16 0535  05/22/16 1914 05/23/16 0017 05/23/16 0712  HGB 13.8  --   --   --   --  10.9*  --   --   --  9.6*  HCT 39.5  --   --   --   --  32.2*  --   --   --  28.7*  PLT 222  --   --   --   --  194  --   --   --  174  APTT  --   --   --   < > 73* 53*  < > 141* 64* 85*  LABPROT  --   --  43.3*  --  25.3* 20.1*  --   --   --   --   INR  --   --  4.71*  --  2.26 1.69  --   --   --   --   CREATININE  --  1.43*  --   --   --  0.89  --   --   --   --   < > = values in this interval not displayed. Estimated Creatinine Clearance: 77.5 mL/min (by C-G formula based on SCr of 0.89 mg/dL).  Assessment: 20 YOF with St. Jude's mechanical valve with elevated INR on admission at [facility], has received vitamin K and INR now down to 1.69.  Per surgery consult note on 05/21/2016 from [facility], patient has been told "she has allergy to heparin, but has tolerated Lovenox in the past". Noted NO allergies listed in [facility] in [place] or [facility] Records. Per notes from [facility], records from [place] being obtained. Noted in outpatient notes in [place] that patient has hemolytic anemia and was diagnosed with SLE based on this.  Patient started on Argatroban at [facility] and continues here. APTT remains therapeutic at 85. No bleeding noted.   Goal of Therapy: APTT 50-90 seconds on argatroban Monitor platelets by anticoagulation protocol   Plan:  Continue argatroban 69mcg/min Daily aPTT and CBC  Salome Arnt, PharmD, BCPS Pager # [phone] 05/23/2016  8:45 AM

## 2016-05-23 NOTE — Interval H&P Note (Signed)
History and Physical Interval Note:  05/23/2016 2:28 PM  [name]  has presented today for surgery, with the diagnosis of Appendicitis  The various methods of treatment have been discussed with the patient and family. After consideration of risks, benefits and other options for treatment, the patient has consented to  Procedure(s): OPEN APPENDECTOMY WITH DRAINAGE OF ABSCESS (N/A) as a surgical intervention .  The patient's history has been reviewed, patient examined, no change in status, stable for surgery.  I have reviewed the patient's chart and labs.  Questions were answered to the patient's satisfaction.     Adin Hector

## 2016-05-23 NOTE — Progress Notes (Signed)
MD made aware of the BP and K level.

## 2016-05-23 NOTE — Progress Notes (Signed)
Peripherally Inserted Central Catheter/Midline Placement  The IV Nurse has discussed with the patient and/or persons authorized to consent for the patient, the purpose of this procedure and the potential benefits and risks involved with this procedure.  The benefits include less needle sticks, lab draws from the catheter, and the patient may be discharged home with the catheter. Risks include, but not limited to, infection, bleeding, blood clot (thrombus formation), and puncture of an artery; nerve damage and irregular heartbeat and possibility to perform a PICC exchange if needed/ordered by physician.  Alternatives to this procedure were also discussed.  Bard Power PICC patient education guide, fact sheet on infection prevention and patient information card has been provided to patient /or left at bedside.    PICC/Midline Placement Documentation        [name] 05/23/2016, 12:04 PM Consent obtained by Fredrik Cove, RN, [facility]-BC

## 2016-05-23 NOTE — Anesthesia Procedure Notes (Signed)
Procedure Name: Intubation Date/Time: 05/23/2016 3:20 PM Performed by: Mariea Clonts Pre-anesthesia Checklist: Patient identified, Emergency Drugs available, Suction available and Patient being monitored Patient Re-evaluated:Patient Re-evaluated prior to inductionOxygen Delivery Method: Circle System Utilized Preoxygenation: Pre-oxygenation with 100% oxygen Intubation Type: IV induction Ventilation: Mask ventilation without difficulty Laryngoscope Size: Miller and 2 Grade View: Grade I Tube type: Oral Number of attempts: 1 Airway Equipment and Method: Stylet and Oral airway Placement Confirmation: ETT inserted through vocal cords under direct vision,  positive ETCO2 and breath sounds checked- equal and bilateral Tube secured with: Tape Dental Injury: Teeth and Oropharynx as per pre-operative assessment

## 2016-05-23 NOTE — Op Note (Signed)
Patient Name:           [name]   Date of Surgery:        05/23/2016  Pre op Diagnosis:      Acute appendicitis with abscess  Post op Diagnosis:    Same  Procedure:                 Open appendectomy, drainage of abscess  Surgeon:                     Edsel Petrin. Dalbert Batman, M.D., FACS  Assistant:                      OR staff   Indication for Assistant: n/a  Operative Indications:   This is a pleasant 46 year old Caucasian female who has had lower abdominal pain for 6 or 7 days.  Initially admitted at [facility] and found to have ruptured appendicitis with fecalith and walled off abscess.  Several radiologists say there is no safe window for percutaneous drainage.  She was transferred here last night.  for surgical care.  Significant comorbidities include SLE on Rituxan, autoimmune hepatitis, rheumatoid arthritis and Raynaud's disease, rheumatic heart disease status post mechanical St. Jude MVR on chronic Coumadin, history of H IT, history of breast cancer status post chemotherapy and mastectomy, GERD.  She continues to have fever to 103 and is otherwise stable.  I have advised proceeding to the OR for appendectomy and drainage of her abscess.     Her Coumadin has been bridged with IV argatroban and this was held for 90 minutes pre op.  Operative Findings:       The appendix was acutely inflamed and ruptured at its proximal third.  The cecum and appendix and terminal ileum were densely adherent to each other as an inflammatory mass, but I was able to break up the adhesions and isolated the appendix and its mesentery.  There was a walled off abscess in the pelvis which had cloudy watery fluid without odor and cultures were taken.  The terminal ileum and cecum otherwise looks normal.  The ovaries looked normal.  She did not have diffuse peritonitis.  There was no unusual bleeding.  Procedure in Detail:          Following the induction of general endotracheal anesthesia a Foley catheter was placed.   The abdomen was prepped and draped in a sterile fashion.  Surgical timeout was performed.  She was up-to-date on her antibiotics    A lower midline incision was made.  The fascia was incised in the midline.  Peritoneal cavity was entered uneventfully.  The abdomen and pelvis was explored with findings as described above.  After breaking up the adherent mass we entered the abscess cavity, cultured its contents and evacuated it.  it did not have a well-defined wall.  We divided the appendiceal mesentery with the Harmonic scalpel.  We stapled off the base of the appendix at the cecum with a GIA stapling device and removed the specimen.  We bolster the repair with adjacent fatty tissue at the terminal ileum.  This appeared very secure.  We further irrigated.  There was no bleeding.  I chose not to place a drain since there was not a defined abscess wall.  The intestine and omentum were returned to their anatomic positions.  The midline fascia was closed with a running suture of #1, double-stranded PDS and the wound packed open with saline  moistened Kerlix.  The patient tolerated the procedure well was taken to PACU in stable condition.  EBL 50 mL or less.  Counts correct.  Complications none.     Edsel Petrin. Dalbert Batman, M.D., FACS General and Minimally Invasive Surgery Breast and Colorectal Surgery  05/23/2016 4:53 PM

## 2016-05-23 NOTE — Consult Note (Signed)
CARDIOLOGY CONSULT NOTE   Patient ID: [name] MRN: [id number] DOB/AGE: 1970/03/05 46 y.o.  Admit date: 05/21/2016  Requesting Physician: Dr. Clementeen Graham Primary Physician:   Purvis Kilts, MD Primary Cardiologist: Dr. Domenic Polite Reason for Consultation: pre op risk assessment  [name] is a 46 y.o. female who is being seen today for the evaluation of pre op clearance at the request of Dr. Clementeen Graham.   HPI: [name] is a 46 y.o. female with a history of SLE on Rituxin, autoimmune hepatitis, RA and Raynaud's disease, rheumatic heart disease s/p mechanical MVR on chronic coumadin, history of HIT, hx of breast cancer s/p chemo/masectomy, chronic diastolic CHF, and GERD who presented to [facility] on 05/21/16 with abdominal pain and fevers. Found to have an acute ruptured appendix with abscess. No good access for IR drainage. Plan is for open appendectomy and drainage of abscess today. Cardiology is asked for pre operative clearance.   Had MR replacement at [facility] 2008 by "keyhole" surgery with St Jude mechanical valve. Dx'd with SLE around 2009-10.  Breast cancer 2011 treated with chemo but had severe recur sepsis on chemo and didn't tolerate full rounds so had mastectomy.  Hx AI hepatitis.  Didn't tolerate plaqeunil (eye toxicity) or prednisone.  Is taking Rituxan every 4 mos for SLE.  Hx "CHF" on lasix f/b Dr Domenic Polite.   Given use of cardiotoxic drug, Rituxan, she has been followed by serial echos. Most recent echo in 10/2015 showed LVEF normal and mitral valve function also within normal range. She was last seen by Dr. Domenic Polite in 12/2015 and doing well.   She was in her usual state of health until last week. She presented to the [facility] ED on 05/21/16 with 4-5 day history of lower abdominal pain, N/V and fevers up to 102 deg.  In ED WBC was 21K, CT abd done showing perf appendix with abscess.   Initially seen by gen surgery and felt to not be a good surgical candidate given INR ^ and  additional comorbidities, recommending medical Rx and IR consult for perc drainage. Her INR was reversed with Vit K. She has remained febrile and quite symptomatic with RLQ pain and vomiting. There does not appear to be a window for percutaneous drainage of her small abscess.  Right gluteal approach wouldn't traverse sciatic nerve and internal iliac vessels and seems high risk. Dr. Dalbert Batman has seen and feels open appendectomy and drainage of abscess today is most appropriate.   Currently feeling okay. Wants to get surgery done today. No CP or SOB. No LE edema, orthopnea or PND. No dizziness or syncope. No blood in stool or urine. No palpitations. She takes lasix 45m daily. Patient is very active at home and regularly performs over 4 METS with no problems.    Past Medical History:  Diagnosis Date  . Abnormal Papanicolaou smear of cervix with positive human papilloma virus (HPV) test 08/17/2015  . Anticoagulation goal of INR 2.5 to 3.5   . Anxiety   . B12 deficiency   . BRCA1 negative   . BRCA2 negative   . Breast cancer ([place] 2011   Left side  . Bursitis of right knee    Septic bursitis  . Drug-induced hepatitis    States per her rheumatologist, transaminases elevated but normalized after drug removed for   . Fibromyalgia   . 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 History of abnormal cervical Pap smear 08/12/2015  .  Mitral valve disease, rheumatic    St. Jude prosthesis  . Peripheral neuropathy ([place])   . Pneumonia   . Raynaud disease   . Raynaud disease   . SLE (systemic lupus erythematosus) ([place])      Past Surgical History:  Procedure Laterality Date  . BREAST SURGERY    . CHOLECYSTECTOMY    . ENDOMETRIAL ABLATION  2008   She no longer has menses  . ESOPHAGOGASTRODUODENOSCOPY N/A 03/25/2013   Dr. Raliegh Scarlet reflux esophagitis-likely source of patient's symptoms (patulous EG junction). Hiatal hernia otherwise normal  . Insertion expander left  breast  11/16/10  . MASTECTOMY    . MASTOPEXY  02/14/2011   Procedure: MASTOPEXY;  Surgeon: Macon Large;  Location: [place];  Service: Plastics;  Laterality: Bilateral;  Right Breast Reduction   . MITRAL VALVE REPLACEMENT    . TISSUE EXPANDER PLACEMENT  02/14/2011   Procedure: TISSUE EXPANDER;  Surgeon: Macon Large;  Location: [place];  Service: Plastics;  Laterality: Bilateral;  Left Breast Remove Tissue Expander Placement of Implant Breast Reconstruction  . TUBAL LIGATION      No Known Allergies  I have reviewed the patient's current medications . levothyroxine  100 mcg Oral QAC breakfast  . mouth rinse  15 mL Mouth Rinse BID  . ondansetron (ZOFRAN) IV  4 mg Intravenous Once  . piperacillin-tazobactam (ZOSYN)  IV  3.375 g Intravenous Q8H  . potassium chloride (KCL MULTIRUN) 30 mEq in 265 mL IVPB  30 mEq Intravenous Once  . rOPINIRole  1 mg Oral QHS  . sodium chloride  1,000 mL Intravenous Once  . umeclidinium-vilanterol  1 puff Inhalation Daily   . argatroban 9 mcg/min (05/23/16 0232)  . sodium chloride 0.9 % 1,000 mL with potassium chloride 40 mEq infusion     acetaminophen **OR** acetaminophen, HYDROmorphone (DILAUDID) injection, LORazepam, promethazine  Prior to Admission medications   Medication Sig Start Date End Date Taking? Authorizing Provider  Albuterol (VENTOLIN IN) Inhale into the lungs as needed.   Yes Historical Provider, MD  ALPRAZolam Duanne Moron) 0.5 MG tablet Take 0.5 mg by mouth at bedtime.  11/02/13  Yes Historical Provider, MD  ANORO ELLIPTA 62.5-25 MCG/INH AEPB Inhale 1 puff into the lungs daily.  05/15/16  Yes Historical Provider, MD  cyanocobalamin (,VITAMIN B-12,) 1000 MCG/ML injection Inject 1 mL (1,000 mcg total) into the muscle every 30 (thirty) days. 12/19/10  Yes Everardo All, MD  dexlansoprazole (DEXILANT) 60 MG capsule Take 1 capsule (60 mg total) by mouth daily. 06/16/15  Yes Annitta Needs, NP  furosemide (LASIX) 40 MG tablet TAKE ONE TABLET BY MOUTH ONCE  DAILY. 02/07/16  Yes Satira Sark, MD  gabapentin (NEURONTIN) 600 MG tablet Take 600 mg by mouth 2 (two) times daily.    Yes Historical Provider, MD  ibuprofen (ADVIL,MOTRIN) 800 MG tablet Take 800 mg by mouth every 6 (six) hours as needed. Reported on 08/30/2015   Yes Historical Provider, MD  levothyroxine (SYNTHROID, LEVOTHROID) 100 MCG tablet Take 1 tablet (100 mcg total) by mouth daily. 12/27/15  Yes Cassandria Anger, MD  metoCLOPramide (REGLAN) 5 MG tablet Take 5 mg by mouth 2 (two) times daily.   Yes Historical Provider, MD  oxyCODONE-acetaminophen (PERCOCET) 7.5-325 MG per tablet Take 1 tablet by mouth every 4 (four) hours as needed for pain. Reported on 08/30/2015   Yes Historical Provider, MD  potassium chloride (KLOR-CON 10) 10 MEQ tablet Take one tablet daily as directed 11/09/13  Yes Historical Provider, MD  riTUXimab (RITUXAN) 100 MG/10ML injection Inject into the vein. Every 4 months   Yes Historical Provider, MD  rOPINIRole (REQUIP) 0.5 MG tablet Take 1 mg by mouth at bedtime.    Yes Historical Provider, MD  warfarin (COUMADIN) 5 MG tablet Take 2 tablets daily except 1 1/2 tablets on Mondays, Wednesdays and Fridays 01/16/16  Yes Satira Sark, MD  zolpidem (AMBIEN) 10 MG tablet Take 10 mg by mouth at bedtime as needed for sleep.  12/19/10  Yes Ezra Sites, MD  temazepam (RESTORIL) 30 MG capsule Take 1 capsule (30 mg total) by mouth at bedtime as needed for sleep. Patient not taking: Reported on 05/21/2016 04/18/16   Holley Bouche, NP     Social History   Social History  . Marital status: Married    Spouse name: N/A  . Number of children: 2  . Years of education: N/A   Occupational History  . Not on file.   Social History Main Topics  . Smoking status: Former Smoker    Packs/day: 1.00    Years: 15.00    Types: Cigarettes    Quit date: 02/19/2006  . Smokeless tobacco: Never Used     Comment: Quit x 10 years  . Alcohol use 0.0 oz/week     Comment: Occasional    . Drug use: No  . Sexual activity: Not Currently    Birth control/ protection: Surgical     Comment: tubal and ablation   Other Topics Concern  . Not on file   Social History Narrative  . No narrative on file    Family Status  Relation Status  . Mother Alive  . Father Alive  . Sister Alive  . Brother Alive  . Daughter Alive  . Paternal Grandfather Deceased  . Paternal Grandmother Deceased  . Maternal Grandmother Deceased  . Maternal Grandfather Deceased  . Son Alive  . Neg Hx    Family History  Problem Relation Age of Onset  . Hypertension Mother   . Hyperlipidemia Mother   . Hypertension Father   . Colon cancer Neg Hx       ROS:  Full 14 point review of systems complete and found to be negative unless listed above.  Physical Exam: Blood pressure (!) 82/62, pulse 77, temperature 99.1 F (37.3 C), temperature source Oral, resp. rate 18, height 5' 4"  (1.626 m), weight 165 lb 2 oz (74.9 kg), SpO2 99 %.  General: Well developed, well nourished, female in no acute distress Head: Eyes PERRLA, No xanthomas.   Normocephalic and atraumatic, oropharynx without edema or exudate.  Lungs: CTAB Heart: HRRR S1 S2, no rub/gallop, Heart regular rate and rhythm with S1, S2 no murmur. pulses are 2+ extrem.   Neck: No carotid bruits. No lymphadenopathy.  No JVD. Abdomen: Bowel sounds present, abdomen soft and non-tender without masses or hernias noted. Msk:  No spine or cva tenderness. No weakness, no joint deformities or effusions. Extremities: No clubbing or cyanosis. No LE edema.  Neuro: Alert and oriented X 3. No focal deficits noted. Psych:  Good affect, responds appropriately Skin: No rashes or lesions noted.  Labs:   Lab Results  Component Value Date   WBC 11.6 (H) 05/23/2016   HGB 9.6 (L) 05/23/2016   HCT 28.7 (L) 05/23/2016   MCV 86.4 05/23/2016   PLT 174 05/23/2016    Recent Labs  05/22/16 0535  INR 1.69    Recent Labs Lab 05/23/16 0712  NA 138  K  2.7*   CL 109  CO2 22  BUN 10  CREATININE 0.69  CALCIUM 7.8*  PROT 5.6*  BILITOT 1.3*  ALKPHOS 79  ALT 19  AST 25  GLUCOSE 108*  ALBUMIN 2.4*   Magnesium  Date Value Ref Range Status  05/23/2016 1.5 (L) 1.7 - 2.4 mg/dL Final   No results for input(s): CKTOTAL, CKMB, TROPONINI in the last 72 hours. No results for input(s): TROPIPOC in the last 72 hours. Pro B Natriuretic peptide (BNP)  Date/Time Value Ref Range Status  11/14/2012 04:15 PM 243.90 (H) <126 pg/mL Final  03/28/2010 06:42 AM 106.0 (H) 0.0 - 100.0 pg/mL Final   Lab Results  Component Value Date   CHOL 229 (H) 09/18/2010   HDL 47 09/18/2010   LDLCALC 160 (H) 09/18/2010   TRIG 111 09/18/2010   No results found for: DDIMER Lipase  Date/Time Value Ref Range Status  05/21/2016 01:58 PM 21 11 - 51 U/L Final   Amylase  Date/Time Value Ref Range Status  05/05/2010 06:14 PM 139 (H) 0 - 105 U/L Final   TSH  Date/Time Value Ref Range Status  12/07/2015 09:31 AM 1.93 mIU/L Final    Comment:      Reference Range   > or = 20 Years  0.40-4.50   Pregnancy Range First trimester  0.26-2.66 Second trimester 0.55-2.73 Third trimester  0.43-2.91      Vitamin B-12  Date/Time Value Ref Range Status  06/22/2013 12:02 PM 740 211 - 911 pg/mL Final    Comment:    Performed at [facility]   Folate  Date/Time Value Ref Range Status  06/24/2012 10:46 AM >20.0 ng/mL Final    Comment:    (NOTE) Reference Ranges        Deficient:       0.4 - 3.3 ng/mL        Indeterminate:   3.4 - 5.4 ng/mL        Normal:              > 5.4 ng/mL   Ferritin  Date/Time Value Ref Range Status  12/22/2012 09:55 AM 154 10 - 291 ng/mL Final    Comment:    Performed at [facility]   TIBC  Date/Time Value Ref Range Status  06/24/2012 10:46 AM 292 250 - 470 ug/dL Final   Iron  Date/Time Value Ref Range Status  06/24/2012 10:46 AM 51 42 - 135 ug/dL Final   Retic Ct Pct  Date/Time Value Ref Range Status  10/18/2015  03:19 PM 2.2 0.4 - 3.1 % Final    Echo: 11/07/2015 LV EF: 55% -   60% Study Conclusions - Left ventricle: The cavity size was normal. Wall thickness was   normal. Systolic function was normal. The estimated ejection   fraction was in the range of 55% to 60%. Limited evaluation of   diastolic function in setting of mechanical MV. Wall motion was   normal; there were no regional wall motion abnormalities. - Aortic valve: Valve area (VTI): 2.13 cm^2. - Mitral valve: Mean gradient (D): 4 mm Hg. - Left atrium: The atrium was moderately dilated. - Right ventricle: The cavity size was mildly dilated. - Technically adequate study.   ECG: none this admission- personally reviewed  TELE:  nsr with some PVCs - personally reviewed  Radiology:  Dg Chest 2 View  Result Date: 05/21/2016 CLINICAL DATA:  Nausea, vomiting, diarrhea, and abdominal pain for the past 4 days. Former smoker. History of  breast malignancy. EXAM: CHEST  2 VIEW COMPARISON:  PA and lateral chest x-ray of September 13, 2015 FINDINGS: The lungs are adequately inflated and clear. The heart and pulmonary vascularity are normal. There is calcification in the wall of the aortic arch. The mediastinum is normal in width. The bony thorax exhibits no acute abnormality. There postsurgical changes on the left with a breast implant present. IMPRESSION: There is no pneumonia, CHF, nor other acute cardiopulmonary abnormality. Thoracic aortic atherosclerosis. Electronically Signed   By: David  Martinique M.D.   On: 05/21/2016 12:49   Ct Abdomen Pelvis W Contrast  Result Date: 05/21/2016 CLINICAL DATA:  46 year old female with nausea, vomiting, diarrhea and abdominal pain since last week. Systemic lupus. Breast cancer post mastectomy. Mitral valve replacement. Initial encounter. EXAM: CT ABDOMEN AND PELVIS WITH CONTRAST TECHNIQUE: Multidetector CT imaging of the abdomen and pelvis was performed using the standard protocol following bolus administration of  intravenous contrast. CONTRAST:  8m ISOVUE-300 IOPAMIDOL (ISOVUE-300) INJECTION 61% COMPARISON:  02/10/2013 CT abdomen and pelvis. FINDINGS: Lower chest: Minimal basilar atelectasis/ scarring. Post left mastectomy with reconstruction and implant in place. Post mitral valve replacement. Heart size within normal limits. Coronary artery calcifications. Hepatobiliary: Enlarged liver spanning over 19.5 cm. No focal hepatic lesion. Postcholecystectomy. Pancreas: Similar configuration of the pancreas. Ectatic splenic artery with coarse calcifications. Spleen: Assessory splenic tissue. Spleen prominent in the anterior-posterior direction unchanged and within normal limits. Adrenals/Urinary Tract: No renal or ureteral obstructing stone or hydronephrosis. Left upper pole 1.4 cm cyst. Tiny low-density structures in both kidneys may be cysts although too small to characterize. No adrenal lesion. No primary urinary bladder abnormality. Stomach/Bowel: Appendicitis with ruptured appendix (appendicoliths are present). Loculated 4.5 x 4 x 3.8 cm loculated abscess surrounds/ envelops the ruptured appendix. Inflammatory process impresses upon the right fallopian tube/ adnexae and is immediately adjacent to terminal ileum. Vascular/Lymphatic: Prominent calcified plaque throughout the abdominal aorta, iliac arteries and femoral arteries with subsequent narrowing. No focal aneurysm or large vessel occlusion. Reactive lymph nodes right pelvis. Reproductive: No primary adnexal abnormality. Other: No free air. Musculoskeletal: Degenerative changes L2-3 with disc space narrowing. IMPRESSION: Appendicitis with ruptured appendix (appendicoliths are present). Loculated 4.5 x 4 x 3.8 cm loculated abscess surrounds/ envelops the ruptured appendix. Inflammatory process impresses upon the right fallopian tube/ adnexae and is immediately adjacent to terminal ileum. Prominent calcified plaque throughout the abdominal aorta, iliac arteries and  femoral arteries with subsequent narrowing. Splenic artery calcifications with ectasia. Coronary artery calcifications. Enlarged liver spanning over 19.5 cm. Left upper pole 1.4 cm cyst. Tiny low-density structures in both kidneys may be cysts although too small to characterize. These results were called by telephone at the time of interpretation on 05/21/2016 at 3:47 pm to Dr. BNat Christen, who verbally acknowledged these results. Electronically Signed   By: SGenia DelM.D.   On: 05/21/2016 16:09    ASSESSMENT AND PLAN:    Principal Problem:   Appendicitis with abscess Active Problems:   History of mitral valve replacement with mechanical valve   Systemic lupus erythematosus ([place]   Long term current use of anticoagulant therapy   Abdominal pain   Volume depletion   History of breast cancer   Sepsis, unspecified organism ([place]   AKI (acute kidney injury) ([facility])   [name] COLEBANKis a 46y.o. female with a history of SLE on Rituxin, autoimmune hepatitis, RA and Raynaud's disease, rheumatic heart disease s/p mechanical MVR on chronic coumadin, history of HIT, hx of breast  cancer s/p chemo/masectomy, GERD who presented to [facility] on 05/21/16 with abdominal pain and fevers. Found to have an acute ruptured appendix with abscess. No good access for IR drainage. Plan is for open appendectomy and drainage of abscess today. Cardiology is asked for pre operative clearance.   Pre op risk assessment:  Patient is very active at home and regularly performs over 4 METS with no problems. She does take lasix at home but has had no clinical CHF recently. No chest pain or CAD history. Biggest risk for her will be coming off coumadin for surgery in the setting of a mechanical valve, which she has done before for breast cancer surgery previously and did fine. I feel like she overall low risk and okay to proceed with surgery from our standpoint. Dr. Marlou Porch to follow   Rheumatic heart disease s/p mechanical MVR (2008): last  echo in 10/2015 showed stable valve replacement and normal LV function. Chronically on Coumadin. INR reversed and now 1.69. Currently being bridged with IV argatroban given hx of HIT. Plan is for surgery today and stop argatroban 1 hour pre op.   Chronic diastolic CHF: she takes lasix 82m daily at home. She has had no clinical CHF. She came in dehydrated and was given IVFs  Hypokalemia: being supplemented  SLE on Rituxan: followed with serial echocardiograms and LVEF has remained stable.   Hypothyroidism: continue synthroid  Signed: KAngelena Form PA-C 05/23/2016 9:58 AM  Pager [phone] Co-Sign MD  Personally seen and examined. Agree with above.  46year old female with mechanical mitral valve with possible history of HIT here with appendiceal abscess requiring surgery.  Preoperative risk assessment  - She is able to complete greater than 4 METS of activity without difficulty. No shortness of breath. No adverse arrhythmias. Her mechanical mitral valve is currently being managed with a bridging protocol consisting of argatroban , pharmacy dosing given her possible prior history of HIT. She may proceed with appendectomy/surgery. Postoperatively, resume argatroban as soon as comfortable by general surgery to minimize the period of time without anticoagulation given her mechanical mitral valve.  - She states that recently she has been on Lovenox as a bridge. This makes me question her prior diagnosis of HIT??  Physical exam: Pleasant, comfortable in bed. Family at bedside. Heart with sharp S1 mechanical click, lungs are clear, no significant edema.  MCandee Furbish MD

## 2016-05-23 NOTE — Progress Notes (Signed)
New order received for 500 cc normal saline bolus over two hours and give two percocet for pain.Orders carried out.Will continue to monitor.

## 2016-05-23 NOTE — Progress Notes (Signed)
[place] for Argatroban Indication: Mechanical Mitral Valve  No Known Allergies  Patient Measurements: Height: 5\' 4"  (162.6 cm) Weight: 156 lb 8.4 oz (71 kg) IBW/kg (Calculated) : 54.7  Vital Signs: Temp: 98.8 F (37.1 C) (04/03 2247) Temp Source: Oral (04/03 2247) BP: 86/61 (04/03 2247) Pulse Rate: 77 (04/03 2247)  Labs:  Recent Labs  05/21/16 1130 05/21/16 1358 05/21/16 1405  05/21/16 1910 05/22/16 0535  05/22/16 1452 05/22/16 1914 05/23/16 0017  HGB 13.8  --   --   --   --  10.9*  --   --   --   --   HCT 39.5  --   --   --   --  32.2*  --   --   --   --   PLT 222  --   --   --   --  194  --   --   --   --   APTT  --   --   --   < > 73* 53*  < > 150* 141* 64*  LABPROT  --   --  43.3*  --  25.3* 20.1*  --   --   --   --   INR  --   --  4.71*  --  2.26 1.69  --   --   --   --   CREATININE  --  1.43*  --   --   --  0.89  --   --   --   --   < > = values in this interval not displayed. Estimated Creatinine Clearance: 75.5 mL/min (by C-G formula based on SCr of 0.89 mg/dL).  Assessment: 80 YOF with St. Jude's mechanical valve, Coumadin on hold, for argatroban  Goal of Therapy: APTT 50-90 seconds on argatroban Monitor platelets by anticoagulation protocol   Plan:  Continue argatroban at current rate Follow-up am labs.  Phillis Knack, PharmD, BCPS 05/23/2016 12:46 AM

## 2016-05-23 NOTE — Consult Note (Signed)
Reason for Consult: complicated appendicitis Referring Physician: Tat, TRIVA HUEBER is an 46 y.o. female.  HPI: 46 yo female with 5 days of abdominal pain. She initially thought it was a stomach flu howevere she went to the ER on 4/2 and was diagnosed with appendicitis. She has had nausea no vomiting, she has some diarrhea. She was transferred down to [facility] due to ruptured status of the appendix and her history of St. Jude mitral valve replacement.  Past Medical History:  Diagnosis Date  . Abnormal Papanicolaou smear of cervix with positive human papilloma virus (HPV) test 08/17/2015  . Anticoagulation goal of INR 2.5 to 3.5   . Anxiety   . B12 deficiency   . BRCA1 negative   . BRCA2 negative   . Breast cancer ([place]) 2011   Left side  . Bursitis of right knee    Septic bursitis  . Drug-induced hepatitis    States per her rheumatologist, transaminases elevated but normalized after drug removed for   . Fibromyalgia   . 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 History of abnormal cervical Pap smear 08/12/2015  . Mitral valve disease, rheumatic    St. Jude prosthesis  . Peripheral neuropathy ([place])   . Pneumonia   . Raynaud disease   . Raynaud disease   . SLE (systemic lupus erythematosus) ([place])     Past Surgical History:  Procedure Laterality Date  . BREAST SURGERY    . CHOLECYSTECTOMY    . ENDOMETRIAL ABLATION  2008   She no longer has menses  . ESOPHAGOGASTRODUODENOSCOPY N/A 03/25/2013   Dr. Raliegh Scarlet reflux esophagitis-likely source of patient's symptoms (patulous EG junction). Hiatal hernia otherwise normal  . Insertion expander left breast  11/16/10  . MASTECTOMY    . MASTOPEXY  02/14/2011   Procedure: MASTOPEXY;  Surgeon: Macon Large;  Location: [place];  Service: Plastics;  Laterality: Bilateral;  Right Breast Reduction   . MITRAL VALVE REPLACEMENT    . TISSUE EXPANDER PLACEMENT  02/14/2011   Procedure: TISSUE EXPANDER;  Surgeon:  Macon Large;  Location: [place];  Service: Plastics;  Laterality: Bilateral;  Left Breast Remove Tissue Expander Placement of Implant Breast Reconstruction  . TUBAL LIGATION      Family History  Problem Relation Age of Onset  . Hypertension Mother   . Hyperlipidemia Mother   . Hypertension Father   . Colon cancer Neg Hx     Social History:  reports that she quit smoking about 10 years ago. Her smoking use included Cigarettes. She has a 15.00 pack-year smoking history. She has never used smokeless tobacco. She reports that she drinks alcohol. She reports that she does not use drugs.  Allergies: No Known Allergies  Medications: I have reviewed the patient's current medications.  Results for orders placed or performed during the hospital encounter of 05/21/16 (from the past 48 hour(s))  CBC     Status: Abnormal   Collection Time: 05/21/16 11:30 AM  Result Value Ref Range   WBC 21.5 (H) 4.0 - 10.5 K/uL   RBC 4.56 3.87 - 5.11 MIL/uL   Hemoglobin 13.8 12.0 - 15.0 g/dL   HCT 39.5 36.0 - 46.0 %   MCV 86.6 78.0 - 100.0 fL   MCH 30.3 26.0 - 34.0 pg   MCHC 34.9 30.0 - 36.0 g/dL   RDW 14.2 11.5 - 15.5 %   Platelets 222 150 - 400 K/uL  Lipase, blood  Status: None   Collection Time: 05/21/16  1:58 PM  Result Value Ref Range   Lipase 21 11 - 51 U/L  Comprehensive metabolic panel     Status: Abnormal   Collection Time: 05/21/16  1:58 PM  Result Value Ref Range   Sodium 135 135 - 145 mmol/L   Potassium 3.0 (L) 3.5 - 5.1 mmol/L   Chloride 102 101 - 111 mmol/L   CO2 19 (L) 22 - 32 mmol/L   Glucose, Bld 88 65 - 99 mg/dL   BUN 32 (H) 6 - 20 mg/dL   Creatinine, Ser 1.43 (H) 0.44 - 1.00 mg/dL   Calcium 8.7 (L) 8.9 - 10.3 mg/dL   Total Protein 7.6 6.5 - 8.1 g/dL   Albumin 3.6 3.5 - 5.0 g/dL   AST 22 15 - 41 U/L   ALT 16 14 - 54 U/L   Alkaline Phosphatase 101 38 - 126 U/L   Total Bilirubin 1.1 0.3 - 1.2 mg/dL   GFR calc non Af Amer 43 (L) >60 mL/min   GFR calc Af Amer 50 (L) >60  mL/min    Comment: (NOTE) The eGFR has been calculated using the CKD EPI equation. This calculation has not been validated in all clinical situations. eGFR's persistently <60 mL/min signify possible Chronic Kidney Disease.    Anion gap 14 5 - 15  Urinalysis, Routine w reflex microscopic     Status: Abnormal   Collection Time: 05/21/16  1:58 PM  Result Value Ref Range   Color, Urine YELLOW YELLOW   APPearance CLOUDY (A) CLEAR   Specific Gravity, Urine 1.015 1.005 - 1.030   pH 6.0 5.0 - 8.0   Glucose, UA NEGATIVE NEGATIVE mg/dL   Hgb urine dipstick TRACE (A) NEGATIVE   Bilirubin Urine SMALL (A) NEGATIVE   Ketones, ur 15 (A) NEGATIVE mg/dL   Protein, ur 30 (A) NEGATIVE mg/dL   Nitrite NEGATIVE NEGATIVE   Leukocytes, UA LARGE (A) NEGATIVE  Urinalysis, Microscopic (reflex)     Status: Abnormal   Collection Time: 05/21/16  1:58 PM  Result Value Ref Range   RBC / HPF 0-5 0 - 5 RBC/hpf   WBC, UA TOO NUMEROUS TO COUNT 0 - 5 WBC/hpf   Bacteria, UA MANY (A) NONE SEEN   Squamous Epithelial / LPF 6-30 (A) NONE SEEN  Protime-INR     Status: Abnormal   Collection Time: 05/21/16  2:05 PM  Result Value Ref Range   Prothrombin Time 43.3 (H) 11.4 - 15.2 seconds   INR 4.71 (HH)     Comment: CRITICAL RESULT CALLED TO, READ BACK BY AND VERIFIED WITH: JJ @ 1724 ON 4.2.18 BY BAYSE,L   APTT     Status: Abnormal   Collection Time: 05/21/16  7:10 PM  Result Value Ref Range   aPTT 73 (H) 24 - 36 seconds    Comment:        IF BASELINE aPTT IS ELEVATED, SUGGEST PATIENT RISK ASSESSMENT BE USED TO DETERMINE APPROPRIATE ANTICOAGULANT THERAPY.   Protime-INR     Status: Abnormal   Collection Time: 05/21/16  7:10 PM  Result Value Ref Range   Prothrombin Time 25.3 (H) 11.4 - 15.2 seconds   INR 2.26   APTT     Status: Abnormal   Collection Time: 05/22/16  5:35 AM  Result Value Ref Range   aPTT 53 (H) 24 - 36 seconds    Comment:        IF BASELINE aPTT IS ELEVATED, SUGGEST PATIENT  RISK  ASSESSMENT BE USED TO DETERMINE APPROPRIATE ANTICOAGULANT THERAPY.   Comprehensive metabolic panel     Status: Abnormal   Collection Time: 05/22/16  5:35 AM  Result Value Ref Range   Sodium 139 135 - 145 mmol/L   Potassium 3.0 (L) 3.5 - 5.1 mmol/L   Chloride 109 101 - 111 mmol/L   CO2 22 22 - 32 mmol/L   Glucose, Bld 95 65 - 99 mg/dL   BUN 21 (H) 6 - 20 mg/dL   Creatinine, Ser 0.89 0.44 - 1.00 mg/dL   Calcium 8.5 (L) 8.9 - 10.3 mg/dL   Total Protein 6.4 (L) 6.5 - 8.1 g/dL   Albumin 3.0 (L) 3.5 - 5.0 g/dL   AST 19 15 - 41 U/L   ALT 16 14 - 54 U/L   Alkaline Phosphatase 91 38 - 126 U/L   Total Bilirubin 1.5 (H) 0.3 - 1.2 mg/dL   GFR calc non Af Amer >60 >60 mL/min   GFR calc Af Amer >60 >60 mL/min    Comment: (NOTE) The eGFR has been calculated using the CKD EPI equation. This calculation has not been validated in all clinical situations. eGFR's persistently <60 mL/min signify possible Chronic Kidney Disease.    Anion gap 8 5 - 15  CBC     Status: Abnormal   Collection Time: 05/22/16  5:35 AM  Result Value Ref Range   WBC 12.8 (H) 4.0 - 10.5 K/uL   RBC 3.69 (L) 3.87 - 5.11 MIL/uL   Hemoglobin 10.9 (L) 12.0 - 15.0 g/dL    Comment: DELTA CHECK NOTED REPEATED TO VERIFY SAMPLE VERIFIED BY RECOLLECTION    HCT 32.2 (L) 36.0 - 46.0 %   MCV 87.3 78.0 - 100.0 fL   MCH 29.5 26.0 - 34.0 pg   MCHC 33.9 30.0 - 36.0 g/dL   RDW 14.1 11.5 - 15.5 %   Platelets 194 150 - 400 K/uL  Protime-INR     Status: Abnormal   Collection Time: 05/22/16  5:35 AM  Result Value Ref Range   Prothrombin Time 20.1 (H) 11.4 - 15.2 seconds   INR 1.69   APTT     Status: Abnormal   Collection Time: 05/22/16 11:35 AM  Result Value Ref Range   aPTT 133 (H) 24 - 36 seconds    Comment:        IF BASELINE aPTT IS ELEVATED, SUGGEST PATIENT RISK ASSESSMENT BE USED TO DETERMINE APPROPRIATE ANTICOAGULANT THERAPY.   APTT     Status: Abnormal   Collection Time: 05/22/16  2:52 PM  Result Value Ref Range    aPTT 150 (H) 24 - 36 seconds    Comment:        IF BASELINE aPTT IS ELEVATED, SUGGEST PATIENT RISK ASSESSMENT BE USED TO DETERMINE APPROPRIATE ANTICOAGULANT THERAPY.   APTT     Status: Abnormal   Collection Time: 05/22/16  7:14 PM  Result Value Ref Range   aPTT 141 (H) 24 - 36 seconds    Comment:        IF BASELINE aPTT IS ELEVATED, SUGGEST PATIENT RISK ASSESSMENT BE USED TO DETERMINE APPROPRIATE ANTICOAGULANT THERAPY.     Dg Chest 2 View  Result Date: 05/21/2016 CLINICAL DATA:  Nausea, vomiting, diarrhea, and abdominal pain for the past 4 days. Former smoker. History of breast malignancy. EXAM: CHEST  2 VIEW COMPARISON:  PA and lateral chest x-ray of September 13, 2015 FINDINGS: The lungs are adequately inflated and clear. The heart and pulmonary  vascularity are normal. There is calcification in the wall of the aortic arch. The mediastinum is normal in width. The bony thorax exhibits no acute abnormality. There postsurgical changes on the left with a breast implant present. IMPRESSION: There is no pneumonia, CHF, nor other acute cardiopulmonary abnormality. Thoracic aortic atherosclerosis. Electronically Signed   By: David  Martinique M.D.   On: 05/21/2016 12:49   Ct Abdomen Pelvis W Contrast  Result Date: 05/21/2016 CLINICAL DATA:  46 year old female with nausea, vomiting, diarrhea and abdominal pain since last week. Systemic lupus. Breast cancer post mastectomy. Mitral valve replacement. Initial encounter. EXAM: CT ABDOMEN AND PELVIS WITH CONTRAST TECHNIQUE: Multidetector CT imaging of the abdomen and pelvis was performed using the standard protocol following bolus administration of intravenous contrast. CONTRAST:  86m ISOVUE-300 IOPAMIDOL (ISOVUE-300) INJECTION 61% COMPARISON:  02/10/2013 CT abdomen and pelvis. FINDINGS: Lower chest: Minimal basilar atelectasis/ scarring. Post left mastectomy with reconstruction and implant in place. Post mitral valve replacement. Heart size within normal limits.  Coronary artery calcifications. Hepatobiliary: Enlarged liver spanning over 19.5 cm. No focal hepatic lesion. Postcholecystectomy. Pancreas: Similar configuration of the pancreas. Ectatic splenic artery with coarse calcifications. Spleen: Assessory splenic tissue. Spleen prominent in the anterior-posterior direction unchanged and within normal limits. Adrenals/Urinary Tract: No renal or ureteral obstructing stone or hydronephrosis. Left upper pole 1.4 cm cyst. Tiny low-density structures in both kidneys may be cysts although too small to characterize. No adrenal lesion. No primary urinary bladder abnormality. Stomach/Bowel: Appendicitis with ruptured appendix (appendicoliths are present). Loculated 4.5 x 4 x 3.8 cm loculated abscess surrounds/ envelops the ruptured appendix. Inflammatory process impresses upon the right fallopian tube/ adnexae and is immediately adjacent to terminal ileum. Vascular/Lymphatic: Prominent calcified plaque throughout the abdominal aorta, iliac arteries and femoral arteries with subsequent narrowing. No focal aneurysm or large vessel occlusion. Reactive lymph nodes right pelvis. Reproductive: No primary adnexal abnormality. Other: No free air. Musculoskeletal: Degenerative changes L2-3 with disc space narrowing. IMPRESSION: Appendicitis with ruptured appendix (appendicoliths are present). Loculated 4.5 x 4 x 3.8 cm loculated abscess surrounds/ envelops the ruptured appendix. Inflammatory process impresses upon the right fallopian tube/ adnexae and is immediately adjacent to terminal ileum. Prominent calcified plaque throughout the abdominal aorta, iliac arteries and femoral arteries with subsequent narrowing. Splenic artery calcifications with ectasia. Coronary artery calcifications. Enlarged liver spanning over 19.5 cm. Left upper pole 1.4 cm cyst. Tiny low-density structures in both kidneys may be cysts although too small to characterize. These results were called by telephone at the  time of interpretation on 05/21/2016 at 3:47 pm to Dr. BNat Christen, who verbally acknowledged these results. Electronically Signed   By: SGenia DelM.D.   On: 05/21/2016 16:09    Review of Systems  Constitutional: Negative for chills and fever.  HENT: Negative for hearing loss.   Eyes: Negative for blurred vision and double vision.  Respiratory: Negative for cough and hemoptysis.   Cardiovascular: Negative for chest pain and palpitations.  Gastrointestinal: Positive for abdominal pain and nausea. Negative for vomiting.  Genitourinary: Negative for dysuria and urgency.  Musculoskeletal: Negative for myalgias and neck pain.  Skin: Negative for itching and rash.  Neurological: Negative for dizziness, tingling and headaches.  Endo/Heme/Allergies: Does not bruise/bleed easily.  Psychiatric/Behavioral: Negative for depression and suicidal ideas.   Blood pressure (!) 86/61, pulse 77, temperature 98.8 F (37.1 C), temperature source Oral, resp. rate 20, height _0  (1.626 m), weight 71 kg (156 lb 8.4 oz), SpO2 100 %. Physical Exam  Vitals  reviewed. Constitutional: She is oriented to person, place, and time. She appears well-developed and well-nourished.  HENT:  Head: Normocephalic and atraumatic.  Eyes: Conjunctivae and EOM are normal. Pupils are equal, round, and reactive to light.  Neck: Normal range of motion. Neck supple.  Cardiovascular: Normal rate and regular rhythm.   Respiratory: Effort normal and breath sounds normal.  GI: Soft. Bowel sounds are normal. She exhibits no distension. There is tenderness in the right lower quadrant.  Musculoskeletal: Normal range of motion.  Neurological: She is alert and oriented to person, place, and time.  Skin: Skin is warm and dry.  Psychiatric: She has a normal mood and affect. Her behavior is normal.      Assessment/Plan: 46 yo female with ruptured appendicitis and history of St. Jude valve -IV abx -recheck labs in am -will discuss with  interventional radiology in am -will continue to follow  Arta Bruce Marely Apgar 05/23/2016, 12:34 AM

## 2016-05-24 ENCOUNTER — Encounter (HOSPITAL_COMMUNITY): Payer: Self-pay | Admitting: General Surgery

## 2016-05-24 DIAGNOSIS — Z7901 Long term (current) use of anticoagulants: Secondary | ICD-10-CM

## 2016-05-24 LAB — CBC
HCT: 27.7 % — ABNORMAL LOW (ref 36.0–46.0)
Hemoglobin: 9 g/dL — ABNORMAL LOW (ref 12.0–15.0)
MCH: 28.6 pg (ref 26.0–34.0)
MCHC: 32.5 g/dL (ref 30.0–36.0)
MCV: 87.9 fL (ref 78.0–100.0)
PLATELETS: 203 10*3/uL (ref 150–400)
RBC: 3.15 MIL/uL — AB (ref 3.87–5.11)
RDW: 14.7 % (ref 11.5–15.5)
WBC: 9.6 10*3/uL (ref 4.0–10.5)

## 2016-05-24 LAB — MAGNESIUM: MAGNESIUM: 2 mg/dL (ref 1.7–2.4)

## 2016-05-24 LAB — BASIC METABOLIC PANEL
ANION GAP: 6 (ref 5–15)
BUN: 5 mg/dL — ABNORMAL LOW (ref 6–20)
CALCIUM: 7.7 mg/dL — AB (ref 8.9–10.3)
CO2: 22 mmol/L (ref 22–32)
Chloride: 111 mmol/L (ref 101–111)
Creatinine, Ser: 0.63 mg/dL (ref 0.44–1.00)
GFR calc Af Amer: >60 mL/min (ref >60)
GLUCOSE: 94 mg/dL (ref 65–99)
POTASSIUM: 3.9 mmol/L (ref 3.5–5.1)
SODIUM: 139 mmol/L (ref 135–145)

## 2016-05-24 LAB — APTT
aPTT: 49 seconds — ABNORMAL HIGH (ref 24–36)
aPTT: 73 seconds — ABNORMAL HIGH (ref 24–36)
aPTT: 68 seconds — ABNORMAL HIGH (ref 24–36)

## 2016-05-24 LAB — PROTIME-INR
INR: 1.44
Prothrombin Time: 17.6 seconds — ABNORMAL HIGH (ref 11.4–15.2)

## 2016-05-24 MED ORDER — ARGATROBAN 50 MG/50ML IV SOLN
8.2000 ug/min | INTRAVENOUS | Status: DC
  Administered 2016-05-24: 9 ug/min via INTRAVENOUS
  Filled 2016-05-24: qty 50

## 2016-05-24 MED ORDER — ACETAMINOPHEN 650 MG RE SUPP: 650.0000 mg | Freq: Four times a day (QID) | RECTAL | Status: DC

## 2016-05-24 MED ORDER — WARFARIN - PHARMACIST DOSING INPATIENT
Freq: Every day | Status: DC
  Administered 2016-05-28: 18:00:00

## 2016-05-24 MED ORDER — METHOCARBAMOL 1000 MG/10ML IJ SOLN
500.0000 mg | Freq: Four times a day (QID) | INTRAVENOUS | Status: DC | PRN
  Filled 2016-05-24: qty 5

## 2016-05-24 MED ORDER — ACETAMINOPHEN 325 MG PO TABS
650.0000 mg | ORAL_TABLET | Freq: Four times a day (QID) | ORAL | Status: DC
  Administered 2016-05-24 – 2016-05-30 (×23): 650 mg via ORAL
  Filled 2016-05-24 (×23): qty 2

## 2016-05-24 MED ORDER — WARFARIN SODIUM 5 MG PO TABS
10.0000 mg | ORAL_TABLET | Freq: Once | ORAL | Status: AC
  Administered 2016-05-24: 10 mg via ORAL
  Filled 2016-05-24: qty 2

## 2016-05-24 NOTE — Progress Notes (Signed)
Progress Note  Patient Name: [name] Date of Encounter: 05/24/2016  Primary Cardiologist: Domenic Polite  Subjective   Feels OK. No CP, no SOB. Post op - yesterday eve had appendectomy.   Inpatient Medications    Scheduled Meds: . acetaminophen  650 mg Oral Q6H   Or  . acetaminophen  650 mg Rectal Q6H  . levothyroxine  100 mcg Oral QAC breakfast  . mouth rinse  15 mL Mouth Rinse BID  . piperacillin-tazobactam (ZOSYN)  IV  3.375 g Intravenous Q8H  . rOPINIRole  1 mg Oral QHS  . sodium chloride  1,000 mL Intravenous Once  . umeclidinium-vilanterol  1 puff Inhalation Daily   Continuous Infusions: . 0.9 % NaCl with KCl 40 mEq / L 125 mL/hr (05/23/16 2344)  . argatroban    . sodium chloride 0.9 % 1,000 mL with potassium chloride 40 mEq infusion 125 mL/hr at 05/23/16 1238   PRN Meds: HYDROmorphone (DILAUDID) injection, LORazepam, methocarbamol (ROBAXIN)  IV, promethazine, sodium chloride flush   Vital Signs    Vitals:   05/23/16 2122 05/24/16 0229 05/24/16 0552 05/24/16 0958  BP: (!) 135/56 (!) 116/58 137/66 (!) 127/56  Pulse: 88 87 87 81  Resp: 17 16 17 14   Temp: 100 F (37.8 C) 99 F (37.2 C) 99.5 F (37.5 C) 98.4 F (36.9 C)  TempSrc: Oral Oral Oral Oral  SpO2: 100% 100% 100% 94%  Weight:      Height:        Intake/Output Summary (Last 24 hours) at 05/24/16 1016 Last data filed at 05/24/16 0756  Gross per 24 hour  Intake           5317.5 ml  Output              850 ml  Net           4467.5 ml   Filed Weights   05/22/16 0535 05/22/16 2112 05/23/16 0652  Weight: 164 lb 4.8 oz (74.5 kg) 156 lb 8.4 oz (71 kg) 165 lb 2 oz (74.9 kg)    Physical Exam   GEN: No acute distress.   Neck: No JVD Cardiac: RRR mechanical S1, sharp click, no murmurs, rubs, or gallops.  Respiratory: Clear to auscultation bilaterally. GI: Soft, nontender, non-distended  MS: No edema; No deformity. Neuro:  Nonfocal  Psych: Normal affect   Labs    Chemistry Recent Labs Lab  05/21/16 1358 05/22/16 0535 05/23/16 0712 05/24/16 0420  NA 135 139 138 139  K 3.0* 3.0* 2.7* 3.9  CL 102 109 109 111  CO2 19* 22 22 22   GLUCOSE 88 95 108* 94  BUN 32* 21* 10 5*  CREATININE 1.43* 0.89 0.69 0.63  CALCIUM 8.7* 8.5* 7.8* 7.7*  PROT 7.6 6.4* 5.6*  --   ALBUMIN 3.6 3.0* 2.4*  --   AST 22 19 25   --   ALT 16 16 19   --   ALKPHOS 101 91 79  --   BILITOT 1.1 1.5* 1.3*  --   GFRNONAA 43* >60 >60 >60  GFRAA 50* >60 >60 >60  ANIONGAP 14 8 7 6      Hematology Recent Labs Lab 05/22/16 0535 05/23/16 0712 05/24/16 0420  WBC 12.8* 11.6* 9.6  RBC 3.69* 3.32* 3.15*  HGB 10.9* 9.6* 9.0*  HCT 32.2* 28.7* 27.7*  MCV 87.3 86.4 87.9  MCH 29.5 28.9 28.6  MCHC 33.9 33.4 32.5  RDW 14.1 14.2 14.7  PLT 194 174 203  Radiology    No results found.  Cardiac Studies   None  Patient Profile     46 y.o. female with mechanical MV, appendiceal abscess  Assessment & Plan    Mechanical MV/ Anticoag/ ?history of HIT  - spoke to nurse to make sure argatroban is restarted. Should be ok from post op perspective.   - Would recommend restarting coumadin tonight to allow increase INR over the next 2-3 days.   - Goal INR 2.5-3.5  No further recs. Will sign off. Please call if ?  Signed, Candee Furbish, MD  05/24/2016, 10:16 AM

## 2016-05-24 NOTE — Progress Notes (Addendum)
[facility] Argatroban Indication: Mechanical Mitral Valve, ?HIT history  No Known Allergies  Patient Measurements: Height: 5\' 4"  (162.6 cm) Weight: 165 lb 2 oz (74.9 kg) IBW/kg (Calculated) : 54.7  Vital Signs: Temp: 99.5 F (37.5 C) (04/05 0552) Temp Source: Oral (04/05 0552) BP: 137/66 (04/05 0552) Pulse Rate: 87 (04/05 0552)  Labs:  Recent Labs  05/21/16 1910 05/22/16 0535  05/23/16 0017 05/23/16 0712 05/24/16 0420  HGB  --  10.9*  --   --  9.6* 9.0*  HCT  --  32.2*  --   --  28.7* 27.7*  PLT  --  194  --   --  174 203  APTT 73* 53*  < > 64* 85* 49*  LABPROT 25.3* 20.1*  --   --   --  17.6*  INR 2.26 1.69  --   --   --  1.44  CREATININE  --  0.89  --   --  0.69 0.63  < > = values in this interval not displayed. Estimated Creatinine Clearance: 86.2 mL/min (by C-G formula based on SCr of 0.63 mg/dL).  Assessment: 43 YOF with St. Jude's mechanical valve with elevated INR on admission at [facility], has received vitamin K and INR now down to 1.44.  Per surgery consult note on 05/21/2016 from [facility], patient has been told "she has allergy to heparin, but has tolerated Lovenox in the past". Noted NO allergies listed in [place] in [place] or [facility] Records. Per notes from [facility], records from [place] being obtained. Noted in outpatient notes in [place] that patient has hemolytic anemia and was diagnosed with SLE based on this.  Patient started on Argatroban at [facility] but was held for surgery. Now resuming POD#1. CBC is stable and no bleeding noted.   Goal of Therapy: APTT 50-90 seconds on argatroban Monitor platelets by anticoagulation protocol   Plan:  Restart argatroban at 76mcg/min Check a 2 hr aPTT Daily aPTT and CBC  Salome Arnt, PharmD, BCPS Pager # [phone] 05/24/2016 8:28 AM   Addendum: Restarting warfarin. Goal INR 2.5-3.5 however, argatroban will influence the INR so will need to shoot for an INR >4. Once this is  achieved will need to recheck INR after argatroban is off to ensure INR remains at goal. Will give warfarin 10mg  PO x 1 tonight, daily INR.  Salome Arnt, PharmD, BCPS Pager # [phone] 05/24/2016 11:40 AM  Addendum #2: Initial aPTT after argatroban restart is therapeutic at 3. Will continue current rate and recheck in 2 hours to confirm dosing.   Salome Arnt, PharmD, BCPS Pager # [phone] 05/24/2016 2:03 PM

## 2016-05-24 NOTE — Progress Notes (Signed)
Patient ID: [name], female   DOB: [birth year], 46 y.o.   MRN: [id number]  [facility] Surgery Progress Note  1 Day Post-Op  Subjective: Patient states that she is very sore this morning. Abdominal pain is lower. Dilaudid helps but wears off too quick. She is feeling a little nauseated, but tolerating clear liquids. No flatus or BM.  Objective: Vital signs in last 24 hours: Temp:  [99 F (37.2 C)-102.7 F (39.3 C)] 99.5 F (37.5 C) (04/05 0552) Pulse Rate:  [77-89] 87 (04/05 0552) Resp:  [14-19] 17 (04/05 0552) BP: (82-158)/(51-66) 137/66 (04/05 0552) SpO2:  [97 %-100 %] 100 % (04/05 0552) Last BM Date: 05/23/16 (before surgery)  Intake/Output from previous day: 04/04 0701 - 04/05 0700 In: 5087.5 [P.O.:300; I.V.:3687.5; IV Piggyback:1100] Out: 850 [Urine:800; Blood:50] Intake/Output this shift: Total I/O In: 230 [P.O.:230] Out: -   PE: Gen:  Alert, NAD, pleasant Card:  RRR, no M/G/R heard Pulm:  CTAB, no W/R/R, effort normal Abd: Soft, ND, mild global tenderness, few BS heard, open midline incision clean with healthy pink tissue and no drainage or surrounding erythema  Lab Results:   Recent Labs  05/23/16 0712 05/24/16 0420  WBC 11.6* 9.6  HGB 9.6* 9.0*  HCT 28.7* 27.7*  PLT 174 203   BMET  Recent Labs  05/23/16 0712 05/24/16 0420  NA 138 139  K 2.7* 3.9  CL 109 111  CO2 22 22  GLUCOSE 108* 94  BUN 10 5*  CREATININE 0.69 0.63  CALCIUM 7.8* 7.7*   PT/INR  Recent Labs  05/22/16 0535 05/24/16 0420  LABPROT 20.1* 17.6*  INR 1.69 1.44   CMP     Component Value Date/Time   NA 139 05/24/2016 0420   K 3.9 05/24/2016 0420   CL 111 05/24/2016 0420   CO2 22 05/24/2016 0420   GLUCOSE 94 05/24/2016 0420   BUN 5 (L) 05/24/2016 0420   CREATININE 0.63 05/24/2016 0420   CREATININE 0.88 11/14/2012 1615   CALCIUM 7.7 (L) 05/24/2016 0420   PROT 5.6 (L) 05/23/2016 0712   ALBUMIN 2.4 (L) 05/23/2016 0712   AST 25 05/23/2016 0712   ALT 19  05/23/2016 0712   ALKPHOS 79 05/23/2016 0712   BILITOT 1.3 (H) 05/23/2016 0712   GFRNONAA >60 05/24/2016 0420   GFRAA >60 05/24/2016 0420   Lipase     Component Value Date/Time   LIPASE 21 05/21/2016 1358       Studies/Results: No results found.  Anti-infectives: Anti-infectives    Start     Dose/Rate Route Frequency Ordered Stop   05/23/16 2330  piperacillin-tazobactam (ZOSYN) IVPB 3.375 g     3.375 g 12.5 mL/hr over 240 Minutes Intravenous Every 8 hours 05/23/16 1742     05/21/16 1600  piperacillin-tazobactam (ZOSYN) IVPB 3.375 g  Status:  Discontinued     3.375 g 12.5 mL/hr over 240 Minutes Intravenous Every 8 hours 05/21/16 1556 05/23/16 1749       Assessment/Plan Acute appendicitis with abscess  S/p Open appendectomy, drainage of abscess 4/4 Dr. Dalbert Batman - POD 1 - WBC 9.6, TMAX 100.5 over night - culture pending  History of mitral valve replacement with mechanical valve UTI AKI SLE Chronic pain Autoimmune hepatitis Hypothyroidism  ID - zosyn 4/2>> FEN - IVF, CLD VTE - SCDs, Hg 9 and stable this morning, will restart argatroban drip  Plan - Restart argatroban per pharmacy. Continue CLD. Encourage ambulation/IS. Consult WOC for abdominal wound vac. Continue zosyn. Follow culture. Recheck labs  in AM.   LOS: 3 days    Jerrye Beavers , [facility] Surgery 05/24/2016, 8:13 AM Pager: [phone] Consults: [phone] Mon-Fri 7:00 am-4:30 pm Sat-Sun 7:00 am-11:30 am

## 2016-05-24 NOTE — Consult Note (Addendum)
[place] Nurse wound consult note Surgical team is following for assessment and plan of care to abd wound.  Reason for Consult:  Requested to apply Vac dressing to abd wound Wound type: Full thickness post-op wound Measurement: 11.5X4X3cm Wound bed: beefy red Drainage (amount, consistency, odor) small amt pink drainage, no odor Periwound: Intact skin surrounding Dressing procedure/placement/frequency: Applied one piece black foam to 168mm cont suction.  Pt medicated for pain prior to procedure and tolerated with mod amt discomfort.  Plan for bedside nurse to change Sat/Tues/Thurs Please re-consult if further assistance is needed.  Thank-you,  Julien Girt MSN, [place], [place], [place], [place]

## 2016-05-24 NOTE — Progress Notes (Signed)
Pharmacy Antibiotic Note  [name] is a 46 y.o. female admitted on 05/21/2016 with appendecitis.  Pharmacy has been consulted for Zosyn dosing. Tmax is 102.7 and WBC is WNL. Dose remains appropriate. S/p open appendectomy with abscess drainage 4/4. Cultures are pending  Plan: Zosyn 3.375g IV q8h (4 hour infusion).  F/u renal fxn, C&S, clinical status  Height: 5\' 4"  (162.6 cm) Weight: 165 lb 2 oz (74.9 kg) IBW/kg (Calculated) : 54.7  Temp (24hrs), Avg:100.2 F (37.9 C), Min:99 F (37.2 C), Max:102.7 F (39.3 C)   Recent Labs Lab 05/21/16 1130 05/21/16 1358 05/22/16 0535 05/23/16 0712 05/24/16 0420  WBC 21.5*  --  12.8* 11.6* 9.6  CREATININE  --  1.43* 0.89 0.69 0.63    Estimated Creatinine Clearance: 86.2 mL/min (by C-G formula based on SCr of 0.63 mg/dL).    No Known Allergies  Antimicrobials this admission: Zosyn 4/2 >>    Thank you for allowing pharmacy to be a part of this patient's care.  Shauniece Kwan, Rande Lawman 05/24/2016 8:30 AM

## 2016-05-24 NOTE — Progress Notes (Signed)
PROGRESS NOTE                                                                                                                                                                                                             Patient Demographics:    Malay Fantroy, is a 46 y.o. female, DOB - 08-Jul-1970, LKT:[id number]  Admit date - 05/21/2016   Admitting Physician Roney Jaffe, MD  Outpatient Primary MD for the patient is Purvis Kilts, MD  LOS - 3  Outpatient Specialists:Cardiology  Chief Complaint  Patient presents with  . Abdominal Pain  . 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 She initially presented to an in-hospital with CT of the abdomen and pelvis showed up inside his with rupture and associated4.5 x 4 x 3.8 cm loculated abscess enveloping the ruptured appendix. Surgery was consulted who recommended I consultation for percutaneous drainage. Patient was transferred to [facility]. IR evaluated the patient and recommended that there was no window for percutaneous drainage of her small abscess. Patient taken to OR for Open appendectomy on 4/4.   Subjective:   Tolerated surgery well. Has some abdominal pain. Wound VAC applied over abdomen. Blood pressure stable. Temperature 100.35F this morning.   Assessment  & Plan :    Principal Problem:   Sepsis secondary to Appendicitis with abscess Continue empiric IV Zosyn. Sepsis and hypertension now resolved. Underwent open appendectomy with drainage of abscess on 4/4. Wound VAC applied. Wound care consult. Follow abscess culture. Pain control as needed.   Active  Problems:   History of mitral valve replacement with mechanical valve Seen by cardiology for preop clearance. Resume coumadin, being bridged with IV argotraban. (Goal INR 2.5-2.5)   UTI On empiric antibiotics.  Acute kidney injury Secondary to sepsis and dehydration. Improved with IV fluids.  Hypokalemia/hypomagnesemia Replenished  Systemic lupus erythematosus -Patient is next due for Rituxan on 06/29/2016 -follows Dr. Berna Bue -intolerant to steroids -eye toxicity on plaquenil  Chronic pain -IV hydromorphone PRN  Autoimmune hepatitis  -Appears clinically compensated  Hypothyroidism -Continue Synthroid  Code Status : Full code  Family Communication  : Mother at bedside  Disposition Plan  : Home once improved  Barriers For Discharge : Active symptoms, postop.  Consults  :   [place] [facility]  Procedures  :  CT abdomen and pelvis Appendectomy  DVT Prophylaxis  : Systemic anticoagulation  Lab Results  Component Value Date   PLT 203 05/24/2016    Antibiotics  :    Anti-infectives    Start     Dose/Rate Route Frequency Ordered Stop   05/23/16 2330  piperacillin-tazobactam (ZOSYN) IVPB 3.375 g     3.375 g 12.5 mL/hr over 240 Minutes Intravenous Every 8 hours 05/23/16 1742     05/21/16 1600  piperacillin-tazobactam (ZOSYN) IVPB 3.375 g  Status:  Discontinued     3.375 g 12.5 mL/hr over 240 Minutes Intravenous Every 8 hours 05/21/16 1556 05/23/16 1749        Objective:   Vitals:   05/24/16 0229 05/24/16 0552 05/24/16 0958 05/24/16 1400  BP: (!) 116/58 137/66 (!) 127/56 (!) 114/55  Pulse: 87 87 81 77  Resp: 16 17 14 14   Temp: 99 F (37.2 C) 99.5 F (37.5 C) 98.4 F (36.9 C) 98.1 F (36.7 C)  TempSrc: Oral Oral Oral Oral  SpO2: 100% 100% 94% 94%  Weight:      Height:        Wt Readings from Last 3 Encounters:  05/23/16 74.9 kg (165 lb 2 oz)  04/18/16 78.9 kg (174 lb)  03/22/16 75.8 kg (167 lb)     Intake/Output  Summary (Last 24 hours) at 05/24/16 1441 Last data filed at 05/24/16 0756  Gross per 24 hour  Intake           5317.5 ml  Output              850 ml  Net           4467.5 ml     Physical Exam  Gen: not in distress HEENT: moist mucosa, supple neck Chest: clear b/l, no added sounds CVS:s1 Click, normal S2, no murmurs GI: soft,  ND, BS+, Midline incision with wound vac applied Musculoskeletal: warm, no edema     Data Review:    CBC  Recent Labs Lab 05/21/16 1130 05/22/16 0535 05/23/16 0712 05/24/16 0420  WBC 21.5* 12.8* 11.6* 9.6  HGB 13.8 10.9* 9.6* 9.0*  HCT 39.5 32.2* 28.7* 27.7*  PLT 222 194 174 203  MCV 86.6 87.3 86.4 87.9  MCH 30.3 29.5 28.9 28.6  MCHC 34.9 33.9 33.4 32.5  RDW 14.2 14.1 14.2 14.7    Chemistries   Recent Labs Lab 05/21/16 1358 05/22/16 0535 05/23/16 0712 05/24/16 0420  NA 135 139 138 139  K 3.0* 3.0* 2.7* 3.9  CL 102 109 109 111  CO2 19* 22 22 22   GLUCOSE 88 95 108* 94  BUN 32* 21* 10 5*  CREATININE 1.43* 0.89 0.69 0.63  CALCIUM 8.7* 8.5* 7.8* 7.7*  MG  --   --  1.5* 2.0  AST 22 19 25   --   ALT 16 16 19   --   ALKPHOS 101 91 79  --   BILITOT 1.1 1.5* 1.3*  --    ------------------------------------------------------------------------------------------------------------------ No results for input(s): CHOL, HDL, LDLCALC, TRIG, CHOLHDL, LDLDIRECT in the last 72 hours.  No results found for: HGBA1C ------------------------------------------------------------------------------------------------------------------ No results for input(s): TSH, T4TOTAL, T3FREE, THYROIDAB in the last 72 hours.  Invalid input(s): FREET3 ------------------------------------------------------------------------------------------------------------------ No results for  input(s): VITAMINB12, FOLATE, FERRITIN, TIBC, IRON, RETICCTPCT in the last 72 hours.  Coagulation profile  Recent Labs Lab 05/21/16 1405 05/21/16 1910 05/22/16 0535 05/24/16 0420  INR  4.71* 2.26 1.69 1.44    No results for input(s): DDIMER in the last 72 hours.  Cardiac Enzymes No results for input(s): CKMB, TROPONINI, MYOGLOBIN in the last 168 hours.  Invalid input(s): CK ------------------------------------------------------------------------------------------------------------------ No results found for: BNP  Inpatient Medications  Scheduled Meds: . acetaminophen  650 mg Oral Q6H   Or  . acetaminophen  650 mg Rectal Q6H  . levothyroxine  100 mcg Oral QAC breakfast  . mouth rinse  15 mL Mouth Rinse BID  . piperacillin-tazobactam (ZOSYN)  IV  3.375 g Intravenous Q8H  . rOPINIRole  1 mg Oral QHS  . sodium chloride  1,000 mL Intravenous Once  . umeclidinium-vilanterol  1 puff Inhalation Daily  . warfarin  10 mg Oral ONCE-1800  . Warfarin - Pharmacist Dosing Inpatient   Does not apply q1800   Continuous Infusions: . 0.9 % NaCl with KCl 40 mEq / L 125 mL/hr (05/23/16 2344)  . argatroban 9 mcg/min (05/24/16 1024)  . sodium chloride 0.9 % 1,000 mL with potassium chloride 40 mEq infusion 125 mL/hr at 05/23/16 1238   PRN Meds:.HYDROmorphone (DILAUDID) injection, LORazepam, methocarbamol (ROBAXIN)  IV, promethazine, sodium chloride flush  Micro Results Recent Results (from the past 240 hour(s))  MRSA PCR Screening     Status: None   Collection Time: 05/23/16 12:57 PM  Result Value Ref Range Status   MRSA by PCR NEGATIVE NEGATIVE Final    Comment:        The GeneXpert MRSA Assay (FDA approved for NASAL specimens only), is one component of a comprehensive MRSA colonization surveillance program. It is not intended to diagnose MRSA infection nor to guide or monitor treatment for MRSA infections.   Aerobic/Anaerobic Culture (surgical/deep wound)     Status: None (Preliminary result)   Collection Time: 05/23/16  3:56 PM  Result Value Ref Range Status   Specimen Description ABSCESS  Final   Special Requests NONE  Final   Gram Stain   Final    ABUNDANT WBC  PRESENT,BOTH PMN AND MONONUCLEAR FEW GRAM NEGATIVE RODS FEW GRAM POSITIVE RODS RARE GRAM POSITIVE COCCI IN PAIRS    Culture TOO YOUNG TO READ  Final   Report Status PENDING  Incomplete    Radiology Reports Dg Chest 2 View  Result Date: 05/21/2016 CLINICAL DATA:  Nausea, vomiting, diarrhea, and abdominal pain for the past 4 days. Former smoker. History of breast malignancy. EXAM: CHEST  2 VIEW COMPARISON:  PA and lateral chest x-ray of September 13, 2015 FINDINGS: The lungs are adequately inflated and clear. The heart and pulmonary vascularity are normal. There is calcification in the wall of the aortic arch. The mediastinum is normal in width. The bony thorax exhibits no acute abnormality. There postsurgical changes on the left with a breast implant present. IMPRESSION: There is no pneumonia, CHF, nor other acute cardiopulmonary abnormality. Thoracic aortic atherosclerosis. Electronically Signed   By: David  Martinique M.D.   On: 05/21/2016 12:49   Ct Abdomen Pelvis W Contrast  Result Date: 05/21/2016 CLINICAL DATA:  46 year old female with nausea, vomiting, diarrhea and abdominal pain since last week. Systemic lupus. Breast cancer post mastectomy. Mitral valve replacement. Initial encounter. EXAM: CT ABDOMEN AND PELVIS WITH CONTRAST TECHNIQUE: Multidetector CT imaging of the abdomen and pelvis was performed using the standard protocol following bolus administration of intravenous  contrast. CONTRAST:  69mL ISOVUE-300 IOPAMIDOL (ISOVUE-300) INJECTION 61% COMPARISON:  02/10/2013 CT abdomen and pelvis. FINDINGS: Lower chest: Minimal basilar atelectasis/ scarring. Post left mastectomy with reconstruction and implant in place. Post mitral valve replacement. Heart size within normal limits. Coronary artery calcifications. Hepatobiliary: Enlarged liver spanning over 19.5 cm. No focal hepatic lesion. Postcholecystectomy. Pancreas: Similar configuration of the pancreas. Ectatic splenic artery with coarse calcifications.  Spleen: Assessory splenic tissue. Spleen prominent in the anterior-posterior direction unchanged and within normal limits. Adrenals/Urinary Tract: No renal or ureteral obstructing stone or hydronephrosis. Left upper pole 1.4 cm cyst. Tiny low-density structures in both kidneys may be cysts although too small to characterize. No adrenal lesion. No primary urinary bladder abnormality. Stomach/Bowel: Appendicitis with ruptured appendix (appendicoliths are present). Loculated 4.5 x 4 x 3.8 cm loculated abscess surrounds/ envelops the ruptured appendix. Inflammatory process impresses upon the right fallopian tube/ adnexae and is immediately adjacent to terminal ileum. Vascular/Lymphatic: Prominent calcified plaque throughout the abdominal aorta, iliac arteries and femoral arteries with subsequent narrowing. No focal aneurysm or large vessel occlusion. Reactive lymph nodes right pelvis. Reproductive: No primary adnexal abnormality. Other: No free air. Musculoskeletal: Degenerative changes L2-3 with disc space narrowing. IMPRESSION: Appendicitis with ruptured appendix (appendicoliths are present). Loculated 4.5 x 4 x 3.8 cm loculated abscess surrounds/ envelops the ruptured appendix. Inflammatory process impresses upon the right fallopian tube/ adnexae and is immediately adjacent to terminal ileum. Prominent calcified plaque throughout the abdominal aorta, iliac arteries and femoral arteries with subsequent narrowing. Splenic artery calcifications with ectasia. Coronary artery calcifications. Enlarged liver spanning over 19.5 cm. Left upper pole 1.4 cm cyst. Tiny low-density structures in both kidneys may be cysts although too small to characterize. These results were called by telephone at the time of interpretation on 05/21/2016 at 3:47 pm to Dr. Nat Christen , who verbally acknowledged these results. Electronically Signed   By: Genia Del M.D.   On: 05/21/2016 16:09    Time Spent in minutes  35   Louellen Molder  M.D on 05/24/2016 at 2:41 PM  Between 7am to 7pm - Pager - [phone]  After 7pm go to [email or web address] - password [facility]  [facility] -  Office  [phone]

## 2016-05-24 NOTE — Progress Notes (Addendum)
[place] for Argatroban + warfarin Indication: Mechanical Mitral Valve, ?HIT history  No Known Allergies  Patient Measurements: Height: 5\' 4"  (162.6 cm) Weight: 165 lb 2 oz (74.9 kg) IBW/kg (Calculated) : 54.7  Vital Signs: Temp: 98.1 F (36.7 C) (04/05 1400) Temp Source: Oral (04/05 1400) BP: 114/55 (04/05 1400) Pulse Rate: 77 (04/05 1400)  Labs:  Recent Labs  05/21/16 1910  05/22/16 0535  05/23/16 0712 05/24/16 0420 05/24/16 1314 05/24/16 1554  HGB  --   < > 10.9*  --  9.6* 9.0*  --   --   HCT  --   --  32.2*  --  28.7* 27.7*  --   --   PLT  --   --  194  --  174 203  --   --   APTT 73*  --  53*  < > 85* 49* 73* 68*  LABPROT 25.3*  --  20.1*  --   --  17.6*  --   --   INR 2.26  --  1.69  --   --  1.44  --   --   CREATININE  --   --  0.89  --  0.69 0.63  --   --   < > = values in this interval not displayed. Estimated Creatinine Clearance: 86.2 mL/min (by C-G formula based on SCr of 0.63 mg/dL).  Assessment: 65 YOF with [employer] mechanical valve with elevated INR on admission at [facility], has received vitamin K.  Per surgery consult note on 05/21/2016 from [facility], patient has been told "she has allergy to heparin, but has tolerated Lovenox in the past." Noted NO allergies listed in [place] in [place] or [place] [facility]. Per notes from [facility], records from [place] being obtained. 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  Patient started on Argatroban at [facility] but was held for surgery,  resumed POD#1 + warfarin. CBC is stable and no bleeding noted.  Restarting warfarin. Goal INR 2.5-3.5 however, argatroban will influence the INR so will need to shoot for an INR >4. Once this is achieved will need to recheck INR after argatroban is off to ensure INR remains at goal. Received warfarin 10mg  dose x 1 tonight.  aPTT after argatroban restart remains therapeutic at  68. Will continue current rate and recheck with AM labs.  Goal of Therapy: APTT 50-90 seconds on argatroban Goal INR 2.5-3.5 (>4 while on argatroban) Monitor platelets by anticoagulation protocol  Plan: Continue argatroban at 9 mcg/min Daily aPTT/CBC/INR Monitor for s/sx bleeding   Elicia Lamp, PharmD, BCPS Clinical Pharmacist 05/24/2016 5:38 PM

## 2016-05-25 LAB — URINALYSIS, ROUTINE W REFLEX MICROSCOPIC
Bacteria, UA: NONE SEEN
Bilirubin Urine: NEGATIVE
GLUCOSE, UA: NEGATIVE mg/dL
HGB URINE DIPSTICK: NEGATIVE
Ketones, ur: NEGATIVE mg/dL
Nitrite: NEGATIVE
PH: 5 (ref 5.0–8.0)
PROTEIN: NEGATIVE mg/dL
Specific Gravity, Urine: 1.019 (ref 1.005–1.030)

## 2016-05-25 LAB — CBC
HCT: 25.1 % — ABNORMAL LOW (ref 36.0–46.0)
Hemoglobin: 8.1 g/dL — ABNORMAL LOW (ref 12.0–15.0)
MCH: 28.8 pg (ref 26.0–34.0)
MCHC: 32.3 g/dL (ref 30.0–36.0)
MCV: 89.3 fL (ref 78.0–100.0)
Platelets: 185 10*3/uL (ref 150–400)
RBC: 2.81 MIL/uL — AB (ref 3.87–5.11)
RDW: 14.8 % (ref 11.5–15.5)
WBC: 7.4 10*3/uL (ref 4.0–10.5)

## 2016-05-25 LAB — BASIC METABOLIC PANEL
Anion gap: 10 (ref 5–15)
CHLORIDE: 109 mmol/L (ref 101–111)
CO2: 21 mmol/L — ABNORMAL LOW (ref 22–32)
CREATININE: 0.51 mg/dL (ref 0.44–1.00)
Calcium: 7.8 mg/dL — ABNORMAL LOW (ref 8.9–10.3)
GFR calc Af Amer: >60 mL/min (ref >60)
GFR calc non Af Amer: >60 mL/min (ref >60)
GLUCOSE: 83 mg/dL (ref 65–99)
POTASSIUM: 4.5 mmol/L (ref 3.5–5.1)
Sodium: 140 mmol/L (ref 135–145)

## 2016-05-25 LAB — APTT: aPTT: 90 seconds — ABNORMAL HIGH (ref 24–36)

## 2016-05-25 LAB — PROTIME-INR
INR: 2.35
Prothrombin Time: 26.1 seconds — ABNORMAL HIGH (ref 11.4–15.2)

## 2016-05-25 MED ORDER — WHITE PETROLATUM GEL
Status: AC
  Administered 2016-05-25: 15:00:00
  Filled 2016-05-25: qty 1

## 2016-05-25 MED ORDER — WARFARIN SODIUM 5 MG PO TABS
7.5000 mg | ORAL_TABLET | Freq: Once | ORAL | Status: AC
  Administered 2016-05-25: 7.5 mg via ORAL
  Filled 2016-05-25: qty 2

## 2016-05-25 MED ORDER — FUROSEMIDE 40 MG PO TABS
40.0000 mg | ORAL_TABLET | Freq: Every day | ORAL | Status: DC
  Administered 2016-05-25 – 2016-05-30 (×6): 40 mg via ORAL
  Filled 2016-05-25 (×6): qty 1

## 2016-05-25 MED ORDER — ALPRAZOLAM 0.5 MG PO TABS
0.5000 mg | ORAL_TABLET | Freq: Every day | ORAL | Status: DC
  Administered 2016-05-26 – 2016-05-29 (×4): 0.5 mg via ORAL
  Filled 2016-05-25 (×5): qty 1

## 2016-05-25 MED ORDER — GABAPENTIN 600 MG PO TABS
600.0000 mg | ORAL_TABLET | Freq: Two times a day (BID) | ORAL | Status: DC
  Administered 2016-05-25 – 2016-05-30 (×11): 600 mg via ORAL
  Filled 2016-05-25 (×12): qty 1

## 2016-05-25 MED ORDER — METOCLOPRAMIDE HCL 5 MG PO TABS
5.0000 mg | ORAL_TABLET | Freq: Three times a day (TID) | ORAL | Status: DC
  Administered 2016-05-25 – 2016-05-27 (×9): 5 mg via ORAL
  Filled 2016-05-25 (×11): qty 1

## 2016-05-25 MED ORDER — ENOXAPARIN SODIUM 80 MG/0.8ML ~~LOC~~ SOLN
75.0000 mg | Freq: Two times a day (BID) | SUBCUTANEOUS | Status: DC
  Administered 2016-05-26 – 2016-05-28 (×5): 75 mg via SUBCUTANEOUS
  Filled 2016-05-25 (×5): qty 0.8

## 2016-05-25 MED ORDER — ENOXAPARIN SODIUM 80 MG/0.8ML ~~LOC~~ SOLN
75.0000 mg | Freq: Once | SUBCUTANEOUS | Status: AC
  Administered 2016-05-25: 75 mg via SUBCUTANEOUS
  Filled 2016-05-25: qty 0.8

## 2016-05-25 NOTE — Progress Notes (Addendum)
ANTICOAGULATION CONSULT NOTE - Follow Up Consult  Pharmacy Consult for change from Argatroban to Lovenox, and Coumadin Indication: Mechanical MVR, poss hx HIT  No Known Allergies  Patient Measurements: Height: 5\' 4"  (162.6 cm) Weight: 165 lb 2 oz (74.9 kg) IBW/kg (Calculated) : 54.7  Vital Signs: Temp: 98.1 F (36.7 C) (04/06 0550) Temp Source: Oral (04/06 0550) BP: 118/64 (04/06 0550) Pulse Rate: 64 (04/06 0550)  Labs:  Recent Labs  05/23/16 0712 05/24/16 0420 05/24/16 1314 05/24/16 1554 05/25/16 0430  HGB 9.6* 9.0*  --   --  8.1*  HCT 28.7* 27.7*  --   --  25.1*  PLT 174 203  --   --  185  APTT 85* 49* 73* 68* 90*  LABPROT  --  17.6*  --   --  26.1*  INR  --  1.44  --   --  2.35  CREATININE 0.69 0.63  --   --  0.51    Estimated Creatinine Clearance: 86.2 mL/min (by C-G formula based on SCr of 0.51 mg/dL).   Medications:  Scheduled:  . acetaminophen  650 mg Oral Q6H   Or  . acetaminophen  650 mg Rectal Q6H  . levothyroxine  100 mcg Oral QAC breakfast  . mouth rinse  15 mL Mouth Rinse BID  . piperacillin-tazobactam (ZOSYN)  IV  3.375 g Intravenous Q8H  . rOPINIRole  1 mg Oral QHS  . sodium chloride  1,000 mL Intravenous Once  . umeclidinium-vilanterol  1 puff Inhalation Daily  . Warfarin - Pharmacist Dosing Inpatient   Does not apply q1800    Assessment: 64 YOF with St. Jude[employer] mechanical valve with elevated INR on admission at [facility], has received vitamin K.  Per surgery consult note on 05/21/2016 from [facility], patient has been told "she has allergy to heparin, but has tolerated Lovenox in the past."  Noted NO allergies listed in [facility] in [place] or [place] Records. Per notes from [facility], records from [place] being obtained. 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  APTT is within goal but significantly up from last PM and plan to decrease Argatroban rate, but we are at the minimum  dose that can be delivered via the Alaris pump.  INR 2.35, elevated due to Argatroban effect and not to be taken at face value.  Hg slightly decreased, post-op, and pltc wnl.  No bleeding noted..  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  She stated that this was in 2008 at time of heart valve replacement and that she has received Lovenox several times since then with no issues.  She is comfortable with receiving Lovenox.  I paged Dr Clementeen Graham regarding this and we will change to Lovenox.  Goal of Therapy:  INR 2.5-3.5 Monitor platelets by anticoagulation protocol: Yes   Plan:  D/C Argatroban and aPTTs Lovenox 75mg  SQ q12, first dose 2hr after Argatroban stopped Coumadin 7.5mg  po tonight Daily INR and CBC q72. Watch for s/s of bleeding  Gracy Bruins, PharmD Clinical Pharmacist [facility]

## 2016-05-25 NOTE — Progress Notes (Addendum)
Notified pharmacy patient's argatroban was at the same ml/hr even though the mch/min was changed, she said she would look at it and get back to me.

## 2016-05-25 NOTE — Progress Notes (Signed)
PROGRESS NOTE                                                                                                                                                                                                             Patient Demographics:    [name], is a 46 y.o. female, DOB - Jan 11, 1971, VWP:[id number]  Admit date - 05/21/2016   Admitting Physician Roney Jaffe, MD  Outpatient Primary MD for the patient is Purvis Kilts, MD  LOS - 4  Outpatient Specialists:Cardiology  Chief Complaint  Patient presents with  . Abdominal Pain  . 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 She initially presented to an in-hospital with CT of the abdomen and pelvis showed up inside his with rupture and associated4.5 x 4 x 3.8 cm loculated abscess enveloping the ruptured appendix. Surgery was consulted who recommended I consultation for percutaneous drainage. Patient was transferred to [facility]. IR evaluated the patient and recommended that there was no window for percutaneous drainage of her small abscess. Patient taken to OR for Open appendectomy on 4/4.   Subjective:   Abdominal pain slowly getting better. Has not had bowel movement yet.   Assessment  & Plan :    Principal Problem:   Sepsis secondary to Appendicitis with abscess Continue empiric IV Zosyn. Sepsis and Hypotension now resolved. Underwent open appendectomy with drainage of abscess on 4/4. Wound VAC applied. Wound care ongoing. Abscess culture growing few GNR, GPR  Pain control as needed.   Active Problems:   History of mitral valve replacement with  mechanical valve Seen by cardiology for preop clearance. 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 As as she had tolerated Lovenox in the past. Will bridge with Lovenox.  Resume Lasix.   UTI On empiric antibiotics.  Acute kidney injury Secondary to sepsis and dehydration. Improved with IV fluids.  Hypokalemia/hypomagnesemia Replenished  Systemic lupus erythematosus -Patient is next due for Rituxan on 06/29/2016 -follows Dr. Berna Bue -intolerant to steroids -eye toxicity on plaquenil  Chronic pain -  IV hydromorphone PRN  Autoimmune hepatitis  -Appears clinically compensated  Hypothyroidism -Continue Synthroid    Code Status : Full code  Family Communication  : Daughter at bedside  Disposition Plan  : Home early next week  Barriers For Discharge : Active symptoms, postop.  Consults  :   [place] [facility]  Procedures  :  CT abdomen and pelvis Appendectomy  DVT Prophylaxis  : Systemic anticoagulation  Lab Results  Component Value Date   PLT 185 05/25/2016    Antibiotics  :    Anti-infectives    Start     Dose/Rate Route Frequency Ordered Stop   05/23/16 2330  piperacillin-tazobactam (ZOSYN) IVPB 3.375 g     3.375 g 12.5 mL/hr over 240 Minutes Intravenous Every 8 hours 05/23/16 1742     05/21/16 1600  piperacillin-tazobactam (ZOSYN) IVPB 3.375 g  Status:  Discontinued     3.375 g 12.5 mL/hr over 240 Minutes Intravenous Every 8 hours 05/21/16 1556 05/23/16 1749        Objective:   Vitals:   05/24/16 2134 05/25/16 0550 05/25/16 0953 05/25/16 1104  BP: (!) 120/58 118/64  117/66  Pulse: 91 64  84  Resp: 16 16    Temp: 98.1 F (36.7 C) 98.1 F (36.7 C)  98.6 F (37 C)  TempSrc: Oral Oral  Oral  SpO2: 97% 100% 96% 94%  Weight:      Height:        Wt Readings from Last 3 Encounters:  05/23/16 74.9 kg (165 lb 2 oz)  04/18/16 78.9 kg (174 lb)  03/22/16 75.8 kg (167 lb)     Intake/Output Summary  (Last 24 hours) at 05/25/16 1347 Last data filed at 05/25/16 0911  Gross per 24 hour  Intake              600 ml  Output              750 ml  Net             -150 ml     Physical Exam  Gen: not in distress HEENT: moist mucosa, supple neck Chest: clear b/l, no added sounds CVS:s1 Click, normal S2, no murmurs GI: soft,  ND, BS+, Midline incision with wound vac applied, mild tenderness Musculoskeletal: warm, no edema     Data Review:    CBC  Recent Labs Lab 05/21/16 1130 05/22/16 0535 05/23/16 0712 05/24/16 0420 05/25/16 0430  WBC 21.5* 12.8* 11.6* 9.6 7.4  HGB 13.8 10.9* 9.6* 9.0* 8.1*  HCT 39.5 32.2* 28.7* 27.7* 25.1*  PLT 222 194 174 203 185  MCV 86.6 87.3 86.4 87.9 89.3  MCH 30.3 29.5 28.9 28.6 28.8  MCHC 34.9 33.9 33.4 32.5 32.3  RDW 14.2 14.1 14.2 14.7 14.8    Chemistries   Recent Labs Lab 05/21/16 1358 05/22/16 0535 05/23/16 0712 05/24/16 0420 05/25/16 0430  NA 135 139 138 139 140  K 3.0* 3.0* 2.7* 3.9 4.5  CL 102 109 109 111 109  CO2 19* 22 22 22  21*  GLUCOSE 88 95 108* 94 83  BUN 32* 21* 10 5* <5*  CREATININE 1.43* 0.89 0.69 0.63 0.51  CALCIUM 8.7* 8.5* 7.8* 7.7* 7.8*  MG  --   --  1.5* 2.0  --   AST 22 19 25   --   --   ALT 16 16 19   --   --   ALKPHOS 101 91 79  --   --   BILITOT  1.1 1.5* 1.3*  --   --    ------------------------------------------------------------------------------------------------------------------ No results for input(s): CHOL, HDL, LDLCALC, TRIG, CHOLHDL, LDLDIRECT in the last 72 hours.  No results found for: HGBA1C ------------------------------------------------------------------------------------------------------------------ No results for input(s): TSH, T4TOTAL, T3FREE, THYROIDAB in the last 72 hours.  Invalid input(s): FREET3 ------------------------------------------------------------------------------------------------------------------ No results for input(s): VITAMINB12, FOLATE, FERRITIN, TIBC, IRON,  RETICCTPCT in the last 72 hours.  Coagulation profile  Recent Labs Lab 05/21/16 1405 05/21/16 1910 05/22/16 0535 05/24/16 0420 05/25/16 0430  INR 4.71* 2.26 1.69 1.44 2.35    No results for input(s): DDIMER in the last 72 hours.  Cardiac Enzymes No results for input(s): CKMB, TROPONINI, MYOGLOBIN in the last 168 hours.  Invalid input(s): CK ------------------------------------------------------------------------------------------------------------------ No results found for: BNP  Inpatient Medications  Scheduled Meds: . acetaminophen  650 mg Oral Q6H   Or  . acetaminophen  650 mg Rectal Q6H  . enoxaparin (LOVENOX) injection  75 mg Subcutaneous Once  . [START ON 05/26/2016] enoxaparin (LOVENOX) injection  75 mg Subcutaneous Q12H  . levothyroxine  100 mcg Oral QAC breakfast  . mouth rinse  15 mL Mouth Rinse BID  . piperacillin-tazobactam (ZOSYN)  IV  3.375 g Intravenous Q8H  . rOPINIRole  1 mg Oral QHS  . sodium chloride  1,000 mL Intravenous Once  . umeclidinium-vilanterol  1 puff Inhalation Daily  . warfarin  7.5 mg Oral ONCE-1800  . Warfarin - Pharmacist Dosing Inpatient   Does not apply q1800  . white petrolatum       Continuous Infusions: . 0.9 % NaCl with KCl 40 mEq / L 125 mL/hr (05/25/16 0148)   PRN Meds:.HYDROmorphone (DILAUDID) injection, LORazepam, methocarbamol (ROBAXIN)  IV, promethazine, sodium chloride flush  Micro Results Recent Results (from the past 240 hour(s))  MRSA PCR Screening     Status: None   Collection Time: 05/23/16 12:57 PM  Result Value Ref Range Status   MRSA by PCR NEGATIVE NEGATIVE Final    Comment:        The GeneXpert MRSA Assay (FDA approved for NASAL specimens only), is one component of a comprehensive MRSA colonization surveillance program. It is not intended to diagnose MRSA infection nor to guide or monitor treatment for MRSA infections.   Fungus Culture With Stain     Status: None (Preliminary result)   Collection  Time: 05/23/16  3:56 PM  Result Value Ref Range Status   Fungus Stain Final report  Final    Comment: (NOTE) Performed At: [facility] [place], [place] [id number] Lindon Romp MD [id number]    Fungus (Mycology) Culture PENDING  Incomplete   Fungal Source ABSCESS  Final  Aerobic/Anaerobic Culture (surgical/deep wound)     Status: None (Preliminary result)   Collection Time: 05/23/16  3:56 PM  Result Value Ref Range Status   Specimen Description ABSCESS  Final   Special Requests NONE  Final   Gram Stain   Final    ABUNDANT WBC PRESENT,BOTH PMN AND MONONUCLEAR FEW GRAM NEGATIVE RODS FEW GRAM POSITIVE RODS RARE GRAM POSITIVE COCCI IN PAIRS    Culture CULTURE REINCUBATED FOR BETTER GROWTH  Final   Report Status PENDING  Incomplete  Fungus Culture Result     Status: None   Collection Time: 05/23/16  3:56 PM  Result Value Ref Range Status   Result 1 Comment  Final    Comment: (NOTE) KOH/Calcofluor preparation:  no fungus observed. Performed At: [facility] [address] [place], [place] [id number] Evette Doffing  Darrick Penna MD YF:[id number]     Radiology Reports Dg Chest 2 View  Result Date: 05/21/2016 CLINICAL DATA:  Nausea, vomiting, diarrhea, and abdominal pain for the past 4 days. Former smoker. History of breast malignancy. EXAM: CHEST  2 VIEW COMPARISON:  PA and lateral chest x-ray of September 13, 2015 FINDINGS: The lungs are adequately inflated and clear. The heart and pulmonary vascularity are normal. There is calcification in the wall of the aortic arch. The mediastinum is normal in width. The bony thorax exhibits no acute abnormality. There postsurgical changes on the left with a breast implant present. IMPRESSION: There is no pneumonia, CHF, nor other acute cardiopulmonary abnormality. Thoracic aortic atherosclerosis. Electronically Signed   By: David  Martinique M.D.   On: 05/21/2016 12:49   Ct Abdomen Pelvis W Contrast  Result Date:  05/21/2016 CLINICAL DATA:  46 year old female with nausea, vomiting, diarrhea and abdominal pain since last week. Systemic lupus. Breast cancer post mastectomy. Mitral valve replacement. Initial encounter. EXAM: CT ABDOMEN AND PELVIS WITH CONTRAST TECHNIQUE: Multidetector CT imaging of the abdomen and pelvis was performed using the standard protocol following bolus administration of intravenous contrast. CONTRAST:  40mL ISOVUE-300 IOPAMIDOL (ISOVUE-300) INJECTION 61% COMPARISON:  02/10/2013 CT abdomen and pelvis. FINDINGS: Lower chest: Minimal basilar atelectasis/ scarring. Post left mastectomy with reconstruction and implant in place. Post mitral valve replacement. Heart size within normal limits. Coronary artery calcifications. Hepatobiliary: Enlarged liver spanning over 19.5 cm. No focal hepatic lesion. Postcholecystectomy. Pancreas: Similar configuration of the pancreas. Ectatic splenic artery with coarse calcifications. Spleen: Assessory splenic tissue. Spleen prominent in the anterior-posterior direction unchanged and within normal limits. Adrenals/Urinary Tract: No renal or ureteral obstructing stone or hydronephrosis. Left upper pole 1.4 cm cyst. Tiny low-density structures in both kidneys may be cysts although too small to characterize. No adrenal lesion. No primary urinary bladder abnormality. Stomach/Bowel: Appendicitis with ruptured appendix (appendicoliths are present). Loculated 4.5 x 4 x 3.8 cm loculated abscess surrounds/ envelops the ruptured appendix. Inflammatory process impresses upon the right fallopian tube/ adnexae and is immediately adjacent to terminal ileum. Vascular/Lymphatic: Prominent calcified plaque throughout the abdominal aorta, iliac arteries and femoral arteries with subsequent narrowing. No focal aneurysm or large vessel occlusion. Reactive lymph nodes right pelvis. Reproductive: No primary adnexal abnormality. Other: No free air. Musculoskeletal: Degenerative changes L2-3 with  disc space narrowing. IMPRESSION: Appendicitis with ruptured appendix (appendicoliths are present). Loculated 4.5 x 4 x 3.8 cm loculated abscess surrounds/ envelops the ruptured appendix. Inflammatory process impresses upon the right fallopian tube/ adnexae and is immediately adjacent to terminal ileum. Prominent calcified plaque throughout the abdominal aorta, iliac arteries and femoral arteries with subsequent narrowing. Splenic artery calcifications with ectasia. Coronary artery calcifications. Enlarged liver spanning over 19.5 cm. Left upper pole 1.4 cm cyst. Tiny low-density structures in both kidneys may be cysts although too small to characterize. These results were called by telephone at the time of interpretation on 05/21/2016 at 3:47 pm to Dr. Nat Christen , who verbally acknowledged these results. Electronically Signed   By: Genia Del M.D.   On: 05/21/2016 16:09    Time Spent in minutes 25   Louellen Molder M.D on 05/25/2016 at 1:47 PM  Between 7am to 7pm - Pager - [phone]  After 7pm go to [email or web address] - password [email or web address]  [facility] -  Office  [phone]

## 2016-05-25 NOTE — Progress Notes (Signed)
Family member assisted with bath. Tech offered assistance, family and Pt stated that they could handle bath themselves. Tech gave Pt all bath supplies.

## 2016-05-25 NOTE — Progress Notes (Signed)
2 Days Post-Op  Subjective: CC:  Acute appendicitis with abscess She still very sore, hurts to move. She is having some nausea with clears. Does not like the clear liquids at all. Wound VAC in place. No flatus.  Objective: Vital signs in last 24 hours: Temp:  [98.1 F (36.7 C)-98.4 F (36.9 C)] 98.1 F (36.7 C) (04/06 0550) Pulse Rate:  [64-91] 64 (04/06 0550) Resp:  [14-16] 16 (04/06 0550) BP: (114-127)/(55-64) 118/64 (04/06 0550) SpO2:  [94 %-100 %] 100 % (04/06 0550) Last BM Date: 05/23/16  Intake/Output from previous day: 04/05 0701 - 04/06 0700 In: 950 [P.O.:950] Out: 450 [Urine:450] Intake/Output this shift: No intake/output data recorded.  General appearance: alert, cooperative and no distress Resp: clear to auscultation bilaterally GI: Soft, still extremely sore and tender. Wound VAC in place. Bowel sounds hypoactive.     Lab Results:   Recent Labs  05/24/16 0420 05/25/16 0430  WBC 9.6 7.4  HGB 9.0* 8.1*  HCT 27.7* 25.1*  PLT 203 185    BMET  Recent Labs  05/24/16 0420 05/25/16 0430  NA 139 140  K 3.9 4.5  CL 111 109  CO2 22 21*  GLUCOSE 94 83  BUN 5* <5*  CREATININE 0.63 0.51  CALCIUM 7.7* 7.8*   PT/INR  Recent Labs  05/24/16 0420 05/25/16 0430  LABPROT 17.6* 26.1*  INR 1.44 2.35     Recent Labs Lab 05/21/16 1358 05/22/16 0535 05/23/16 0712  AST 22 19 25   ALT 16 16 19   ALKPHOS 101 91 79  BILITOT 1.1 1.5* 1.3*  PROT 7.6 6.4* 5.6*  ALBUMIN 3.6 3.0* 2.4*     Lipase     Component Value Date/Time   LIPASE 21 05/21/2016 1358     Studies/Results: No results found. Prior to Admission medications   Medication Sig Start Date End Date Taking? Authorizing Provider  Albuterol (VENTOLIN IN) Inhale into the lungs as needed.   Yes Historical Provider, MD  ALPRAZolam Duanne Moron) 0.5 MG tablet Take 0.5 mg by mouth at bedtime.  11/02/13  Yes Historical Provider, MD  ANORO ELLIPTA 62.5-25 MCG/INH AEPB Inhale 1 puff into the lungs daily.   05/15/16  Yes Historical Provider, MD  cyanocobalamin (,VITAMIN B-12,) 1000 MCG/ML injection Inject 1 mL (1,000 mcg total) into the muscle every 30 (thirty) days. 12/19/10  Yes Everardo All, MD  dexlansoprazole (DEXILANT) 60 MG capsule Take 1 capsule (60 mg total) by mouth daily. 06/16/15  Yes Annitta Needs, NP  furosemide (LASIX) 40 MG tablet TAKE ONE TABLET BY MOUTH ONCE DAILY. 02/07/16  Yes Satira Sark, MD  gabapentin (NEURONTIN) 600 MG tablet Take 600 mg by mouth 2 (two) times daily.    Yes Historical Provider, MD  ibuprofen (ADVIL,MOTRIN) 800 MG tablet Take 800 mg by mouth every 6 (six) hours as needed. Reported on 08/30/2015   Yes Historical Provider, MD  levothyroxine (SYNTHROID, LEVOTHROID) 100 MCG tablet Take 1 tablet (100 mcg total) by mouth daily. 12/27/15  Yes Cassandria Anger, MD  metoCLOPramide (REGLAN) 5 MG tablet Take 5 mg by mouth 2 (two) times daily.   Yes Historical Provider, MD  oxyCODONE-acetaminophen (PERCOCET) 7.5-325 MG per tablet Take 1 tablet by mouth every 4 (four) hours as needed for pain. Reported on 08/30/2015   Yes Historical Provider, MD  potassium chloride (KLOR-CON 10) 10 MEQ tablet Take one tablet daily as directed 11/09/13  Yes Historical Provider, MD  riTUXimab (RITUXAN) 100 MG/10ML injection Inject into the vein. Every 4 months  Yes Historical Provider, MD  rOPINIRole (REQUIP) 0.5 MG tablet Take 1 mg by mouth at bedtime.    Yes Historical Provider, MD  warfarin (COUMADIN) 5 MG tablet Take 2 tablets daily except 1 1/2 tablets on Mondays, Wednesdays and Fridays 01/16/16  Yes Satira Sark, MD  zolpidem (AMBIEN) 10 MG tablet Take 10 mg by mouth at bedtime as needed for sleep.  12/19/10  Yes Ezra Sites, MD  temazepam (RESTORIL) 30 MG capsule Take 1 capsule (30 mg total) by mouth at bedtime as needed for sleep. Patient not taking: Reported on 05/21/2016 04/18/16   Holley Bouche, NP    Medications: . acetaminophen  650 mg Oral Q6H   Or  .  acetaminophen  650 mg Rectal Q6H  . levothyroxine  100 mcg Oral QAC breakfast  . mouth rinse  15 mL Mouth Rinse BID  . piperacillin-tazobactam (ZOSYN)  IV  3.375 g Intravenous Q8H  . rOPINIRole  1 mg Oral QHS  . sodium chloride  1,000 mL Intravenous Once  . umeclidinium-vilanterol  1 puff Inhalation Daily  . Warfarin - Pharmacist Dosing Inpatient   Does not apply q1800   . 0.9 % NaCl with KCl 40 mEq / L 125 mL/hr (05/25/16 0148)  . argatroban 9 mcg/min (05/24/16 1024)   Assessment/Plan Acute appendicitis with abscess S/P open appendectomy/drainage of abscess, 05/23/16 Dr. Fanny Skates. 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 Jude[employer] mechanical valve replacement.  Chronic anticoagulation secondary to mitral valve  Ray nonstress disease Peripheral neuropathy/history of fibromyalgia Hypothyroid FEN: IV fluids/clear liquids ID: Zosyn 05/21/16 =>> day 5  Wound culture pending DVT:  Argatroban drip  Plan:  Mobilize, were going to give her full liquids so she has more choices. We told her to go very slow on this. We wanted to get up and walk some today. Decrease IV fluid rate.  LOS: 4 days    [name] 05/25/2016 [phone]

## 2016-05-26 LAB — PROTIME-INR
INR: 2.18
PROTHROMBIN TIME: 24.6 s — AB (ref 11.4–15.2)

## 2016-05-26 LAB — URINE CULTURE: Culture: NO GROWTH

## 2016-05-26 MED ORDER — CLOTRIMAZOLE 10 MG MT TROC
10.0000 mg | Freq: Every day | OROMUCOSAL | Status: DC
  Administered 2016-05-26 – 2016-05-30 (×17): 10 mg via ORAL
  Filled 2016-05-26 (×23): qty 1

## 2016-05-26 MED ORDER — OXYCODONE-ACETAMINOPHEN 5-325 MG PO TABS
1.0000 | ORAL_TABLET | Freq: Four times a day (QID) | ORAL | Status: DC | PRN
  Administered 2016-05-27: 2 via ORAL
  Filled 2016-05-26: qty 2

## 2016-05-26 MED ORDER — RISAQUAD PO CAPS
1.0000 | ORAL_CAPSULE | Freq: Every day | ORAL | Status: DC
  Administered 2016-05-26 – 2016-05-30 (×5): 1 via ORAL
  Filled 2016-05-26 (×5): qty 1

## 2016-05-26 MED ORDER — HYDROMORPHONE HCL 1 MG/ML IJ SOLN: 1.0000 mg | INTRAMUSCULAR | Status: DC | PRN

## 2016-05-26 MED ORDER — PROMETHAZINE HCL 25 MG PO TABS
12.5000 mg | ORAL_TABLET | Freq: Four times a day (QID) | ORAL | Status: DC | PRN
  Administered 2016-05-27 (×2): 12.5 mg via ORAL
  Filled 2016-05-26 (×2): qty 1

## 2016-05-26 MED ORDER — WARFARIN SODIUM 5 MG PO TABS
7.5000 mg | ORAL_TABLET | Freq: Once | ORAL | Status: AC
  Administered 2016-05-26: 7.5 mg via ORAL
  Filled 2016-05-26: qty 2

## 2016-05-26 MED ORDER — PROMETHAZINE HCL 25 MG/ML IJ SOLN
6.2500 mg | Freq: Three times a day (TID) | INTRAMUSCULAR | Status: DC | PRN
  Administered 2016-05-26 (×2): 6.25 mg via INTRAVENOUS
  Filled 2016-05-26 (×2): qty 1

## 2016-05-26 NOTE — Progress Notes (Signed)
ANTICOAGULATION CONSULT NOTE - Follow Up Consult  Pharmacy Consult for Lovenox bridge to Coumadin Indication: Mechanical heart valve  Allergies  Allergen Reactions  . 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 kph    Patient Measurements: Height: 5\' 4"  (162.6 cm) Weight: 165 lb 2 oz (74.9 kg) IBW/kg (Calculated) : 54.7 Heparin Dosing Weight: 75  Vital Signs: Temp: 98.8 F (37.1 C) (04/07 0600) Temp Source: Oral (04/07 0600) BP: 113/69 (04/07 0600) Pulse Rate: 76 (04/07 0600)  Labs:  Recent Labs  05/24/16 0420 05/24/16 1314 05/24/16 1554 05/25/16 0430 05/26/16 0518  HGB 9.0*  --   --  8.1*  --   HCT 27.7*  --   --  25.1*  --   PLT 203  --   --  185  --   APTT 49* 73* 68* 90*  --   LABPROT 17.6*  --   --  26.1* 24.6*  INR 1.44  --   --  2.35 2.18  CREATININE 0.63  --   --  0.51  --     Estimated Creatinine Clearance: 86.2 mL/min (by C-G formula based on SCr of 0.51 mg/dL).   Medications:  Scheduled:  . acetaminophen  650 mg Oral Q6H   Or  . acetaminophen  650 mg Rectal Q6H  . ALPRAZolam  0.5 mg Oral QHS  . enoxaparin (LOVENOX) injection  75 mg Subcutaneous Q12H  . furosemide  40 mg Oral Daily  . gabapentin  600 mg Oral BID  . levothyroxine  100 mcg Oral QAC breakfast  . mouth rinse  15 mL Mouth Rinse BID  . metoCLOPramide  5 mg Oral TID AC & HS  . piperacillin-tazobactam (ZOSYN)  IV  3.375 g Intravenous Q8H  . rOPINIRole  1 mg Oral QHS  . sodium chloride  1,000 mL Intravenous Once  . umeclidinium-vilanterol  1 puff Inhalation Daily  . Warfarin - Pharmacist Dosing Inpatient   Does not apply q1800    Assessment: 89 YOF with St. Jude's mechanical valve with elevated INR on admission at [facility],  received vitamin K.  Pt with remote history of HIT.  She stated that this was in 2008 at time of heart valve replacement and that she has received Lovenox several  times since then with no issues.  She is comfortable with receiving Lovenox.  INR is decreased today, expected minus Argatroban which can falsely elevate INR.  No bleeding noted, no labs today.  Goal of Therapy:  INR 2-3 Anti-Xa level 0.6-1 units/ml 4hrs after LMWH dose given Monitor platelets by anticoagulation protocol: Yes   Plan:  Continue Lovenox 75mg  SQ q12 Coumadin 7.5mg  po today Daily INR CBC q48  Gracy Bruins, PharmD Clinical Pharmacist [facility]

## 2016-05-26 NOTE — Progress Notes (Signed)
PROGRESS NOTE                                                                                                                                                                                                             Patient Demographics:    [name], is a 46 y.o. female, DOB - 1970-11-08, XTK:[id number]  Admit date - 05/21/2016   Admitting Physician Roney Jaffe, MD  Outpatient Primary MD for the patient is Purvis Kilts, MD  LOS - 5  Outpatient Specialists:Cardiology  Chief Complaint  Patient presents with  . Abdominal Pain  . 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 She initially presented to an in-hospital with CT of the abdomen and pelvis showed up inside his with rupture and associated4.5 x 4 x 3.8 cm loculated abscess enveloping the ruptured appendix. Surgery was consulted who recommended I consultation for percutaneous drainage. Patient was transferred to [facility]. IR evaluated the patient and recommended that there was no window for percutaneous drainage of her small abscess. Patient taken to OR for Open appendectomy on 4/4.   Subjective:   Feeling better and slowly ambulating. Abdominal pain improving.   Assessment  & Plan :    Principal Problem:   Sepsis secondary to Appendicitis with abscess  Sepsis and Hypotension now resolved. Underwent open appendectomy with drainage of abscess on 4/4. Wound VAC applied. Wound care ongoing. Abscess culture growing Pseudomonas. On empiric Zosyn.  Pain control as needed.   Active Problems:   History of mitral valve replacement with mechanical  valve Seen by cardiology for preop clearance. 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 As as she had tolerated Lovenox in the past. now bridge with Lovenox.  Resume Lasix.   UTI CO2-empiric antibiotics.  Acute kidney injury Secondary to sepsis and dehydration. Improved with IV fluids.  Hypokalemia/hypomagnesemia Replenished  Systemic lupus erythematosus -Patient is next due for Rituxan on 06/29/2016 -follows Dr. Berna Bue -intolerant to steroids -eye toxicity on plaquenil  Chronic pain -IV hydromorphone PRN  Autoimmune hepatitis  -  Appears clinically compensated  Hypothyroidism -Continue Synthroid    Code Status : Full code  Family Communication  : Mother and Daughter at bedside  Disposition Plan  : Home possibly early next week  Barriers For Discharge : Active symptoms, postop.  Consults  :   [facility]  Procedures  :  CT abdomen and pelvis Open Appendectomy  DVT Prophylaxis  : Systemic anticoagulation  Lab Results  Component Value Date   PLT 185 05/25/2016    Antibiotics  :    Anti-infectives    Start     Dose/Rate Route Frequency Ordered Stop   05/23/16 2330  piperacillin-tazobactam (ZOSYN) IVPB 3.375 g     3.375 g 12.5 mL/hr over 240 Minutes Intravenous Every 8 hours 05/23/16 1742     05/21/16 1600  piperacillin-tazobactam (ZOSYN) IVPB 3.375 g  Status:  Discontinued     3.375 g 12.5 mL/hr over 240 Minutes Intravenous Every 8 hours 05/21/16 1556 05/23/16 1749        Objective:   Vitals:   05/25/16 1435 05/25/16 2111 05/26/16 0600 05/26/16 0809  BP: 122/69 139/65 113/69   Pulse: 73 86 76   Resp: 18 18 16    Temp: 97.8 F (36.6 C) 98.5 F (36.9 C) 98.8 F (37.1 C)   TempSrc: Oral Oral Oral   SpO2: 97% 94% 97% 100%  Weight:      Height:        Wt Readings from Last 3 Encounters:  05/23/16 74.9 kg (165 lb 2 oz)  04/18/16 78.9 kg (174 lb)  03/22/16 75.8 kg (167 lb)      Intake/Output Summary (Last 24 hours) at 05/26/16 1144 Last data filed at 05/26/16 4008  Gross per 24 hour  Intake           2817.5 ml  Output             2350 ml  Net            467.5 ml     Physical Exam  Gen: not in distress HEENT: moist mucosa, supple neck Chest: clear b/l, no added sounds CVS:s1 Click, normal S2, no murmurs GI: soft,  ND, BS+, Midline incision with wound vac applied, mild tenderness Musculoskeletal: warm, no edema     Data Review:    CBC  Recent Labs Lab 05/21/16 1130 05/22/16 0535 05/23/16 0712 05/24/16 0420 05/25/16 0430  WBC 21.5* 12.8* 11.6* 9.6 7.4  HGB 13.8 10.9* 9.6* 9.0* 8.1*  HCT 39.5 32.2* 28.7* 27.7* 25.1*  PLT 222 194 174 203 185  MCV 86.6 87.3 86.4 87.9 89.3  MCH 30.3 29.5 28.9 28.6 28.8  MCHC 34.9 33.9 33.4 32.5 32.3  RDW 14.2 14.1 14.2 14.7 14.8    Chemistries   Recent Labs Lab 05/21/16 1358 05/22/16 0535 05/23/16 0712 05/24/16 0420 05/25/16 0430  NA 135 139 138 139 140  K 3.0* 3.0* 2.7* 3.9 4.5  CL 102 109 109 111 109  CO2 19* 22 22 22  21*  GLUCOSE 88 95 108* 94 83  BUN 32* 21* 10 5* <5*  CREATININE 1.43* 0.89 0.69 0.63 0.51  CALCIUM 8.7* 8.5* 7.8* 7.7* 7.8*  MG  --   --  1.5* 2.0  --   AST 22 19 25   --   --   ALT 16 16 19   --   --   ALKPHOS 101 91 79  --   --   BILITOT 1.1 1.5* 1.3*  --   --    ------------------------------------------------------------------------------------------------------------------  No results for input(s): CHOL, HDL, LDLCALC, TRIG, CHOLHDL, LDLDIRECT in the last 72 hours.  No results found for: HGBA1C ------------------------------------------------------------------------------------------------------------------ No results for input(s): TSH, T4TOTAL, T3FREE, THYROIDAB in the last 72 hours.  Invalid input(s): FREET3 ------------------------------------------------------------------------------------------------------------------ No results for input(s): VITAMINB12,  FOLATE, FERRITIN, TIBC, IRON, RETICCTPCT in the last 72 hours.  Coagulation profile  Recent Labs Lab 05/21/16 1910 05/22/16 0535 05/24/16 0420 05/25/16 0430 05/26/16 0518  INR 2.26 1.69 1.44 2.35 2.18    No results for input(s): DDIMER in the last 72 hours.  Cardiac Enzymes No results for input(s): CKMB, TROPONINI, MYOGLOBIN in the last 168 hours.  Invalid input(s): CK ------------------------------------------------------------------------------------------------------------------ No results found for: BNP  Inpatient Medications  Scheduled Meds: . acetaminophen  650 mg Oral Q6H   Or  . acetaminophen  650 mg Rectal Q6H  . ALPRAZolam  0.5 mg Oral QHS  . enoxaparin (LOVENOX) injection  75 mg Subcutaneous Q12H  . furosemide  40 mg Oral Daily  . gabapentin  600 mg Oral BID  . levothyroxine  100 mcg Oral QAC breakfast  . mouth rinse  15 mL Mouth Rinse BID  . metoCLOPramide  5 mg Oral TID AC & HS  . piperacillin-tazobactam (ZOSYN)  IV  3.375 g Intravenous Q8H  . rOPINIRole  1 mg Oral QHS  . sodium chloride  1,000 mL Intravenous Once  . umeclidinium-vilanterol  1 puff Inhalation Daily  . Warfarin - Pharmacist Dosing Inpatient   Does not apply q1800   Continuous Infusions: . 0.9 % NaCl with KCl 40 mEq / L 125 mL/hr (05/26/16 0558)   PRN Meds:.HYDROmorphone (DILAUDID) injection, LORazepam, methocarbamol (ROBAXIN)  IV, promethazine, promethazine, sodium chloride flush  Micro Results Recent Results (from the past 240 hour(s))  MRSA PCR Screening     Status: None   Collection Time: 05/23/16 12:57 PM  Result Value Ref Range Status   MRSA by PCR NEGATIVE NEGATIVE Final    Comment:        The GeneXpert MRSA Assay (FDA approved for NASAL specimens only), is one component of a comprehensive MRSA colonization surveillance program. It is not intended to diagnose MRSA infection nor to guide or monitor treatment for MRSA infections.   Fungus Culture With Stain     Status:  None (Preliminary result)   Collection Time: 05/23/16  3:56 PM  Result Value Ref Range Status   Fungus Stain Final report  Final    Comment: (NOTE) Performed At: [facility] [place], [place] [id number] Lindon Romp MD JS:[id number]    Fungus (Mycology) Culture PENDING  Incomplete   Fungal Source ABSCESS  Final  Aerobic/Anaerobic Culture (surgical/deep wound)     Status: None (Preliminary result)   Collection Time: 05/23/16  3:56 PM  Result Value Ref Range Status   Specimen Description ABSCESS  Final   Special Requests NONE  Final   Gram Stain   Final    ABUNDANT WBC PRESENT,BOTH PMN AND MONONUCLEAR FEW GRAM NEGATIVE RODS FEW GRAM POSITIVE RODS RARE GRAM POSITIVE COCCI IN PAIRS    Culture   Final    FEW PSEUDOMONAS AERUGINOSA CULTURE REINCUBATED FOR BETTER GROWTH HOLDING FOR POSSIBLE ANAEROBE    Report Status PENDING  Incomplete   Organism ID, Bacteria PSEUDOMONAS AERUGINOSA  Final      Susceptibility   Pseudomonas aeruginosa - MIC*    CEFTAZIDIME 2 SENSITIVE Sensitive     CIPROFLOXACIN <=0.25 SENSITIVE Sensitive     GENTAMICIN <=1 SENSITIVE Sensitive  IMIPENEM <=0.25 SENSITIVE Sensitive     PIP/TAZO 8 SENSITIVE Sensitive     CEFEPIME <=1 SENSITIVE Sensitive     * FEW PSEUDOMONAS AERUGINOSA  Fungus Culture Result     Status: None   Collection Time: 05/23/16  3:56 PM  Result Value Ref Range Status   Result 1 Comment  Final    Comment: (NOTE) KOH/Calcofluor preparation:  no fungus observed. Performed At: [facility] [place], [place] [id number] Lindon Romp MD AL:[id number]     Radiology Reports Dg Chest 2 View  Result Date: 05/21/2016 CLINICAL DATA:  Nausea, vomiting, diarrhea, and abdominal pain for the past 4 days. Former smoker. History of breast malignancy. EXAM: CHEST  2 VIEW COMPARISON:  PA and lateral chest x-ray of September 13, 2015 FINDINGS: The lungs are adequately inflated and clear. The heart and  pulmonary vascularity are normal. There is calcification in the wall of the aortic arch. The mediastinum is normal in width. The bony thorax exhibits no acute abnormality. There postsurgical changes on the left with a breast implant present. IMPRESSION: There is no pneumonia, CHF, nor other acute cardiopulmonary abnormality. Thoracic aortic atherosclerosis. Electronically Signed   By: David  Martinique M.D.   On: 05/21/2016 12:49   Ct Abdomen Pelvis W Contrast  Result Date: 05/21/2016 CLINICAL DATA:  46 year old female with nausea, vomiting, diarrhea and abdominal pain since last week. Systemic lupus. Breast cancer post mastectomy. Mitral valve replacement. Initial encounter. EXAM: CT ABDOMEN AND PELVIS WITH CONTRAST TECHNIQUE: Multidetector CT imaging of the abdomen and pelvis was performed using the standard protocol following bolus administration of intravenous contrast. CONTRAST:  49mL ISOVUE-300 IOPAMIDOL (ISOVUE-300) INJECTION 61% COMPARISON:  02/10/2013 CT abdomen and pelvis. FINDINGS: Lower chest: Minimal basilar atelectasis/ scarring. Post left mastectomy with reconstruction and implant in place. Post mitral valve replacement. Heart size within normal limits. Coronary artery calcifications. Hepatobiliary: Enlarged liver spanning over 19.5 cm. No focal hepatic lesion. Postcholecystectomy. Pancreas: Similar configuration of the pancreas. Ectatic splenic artery with coarse calcifications. Spleen: Assessory splenic tissue. Spleen prominent in the anterior-posterior direction unchanged and within normal limits. Adrenals/Urinary Tract: No renal or ureteral obstructing stone or hydronephrosis. Left upper pole 1.4 cm cyst. Tiny low-density structures in both kidneys may be cysts although too small to characterize. No adrenal lesion. No primary urinary bladder abnormality. Stomach/Bowel: Appendicitis with ruptured appendix (appendicoliths are present). Loculated 4.5 x 4 x 3.8 cm loculated abscess surrounds/ envelops  the ruptured appendix. Inflammatory process impresses upon the right fallopian tube/ adnexae and is immediately adjacent to terminal ileum. Vascular/Lymphatic: Prominent calcified plaque throughout the abdominal aorta, iliac arteries and femoral arteries with subsequent narrowing. No focal aneurysm or large vessel occlusion. Reactive lymph nodes right pelvis. Reproductive: No primary adnexal abnormality. Other: No free air. Musculoskeletal: Degenerative changes L2-3 with disc space narrowing. IMPRESSION: Appendicitis with ruptured appendix (appendicoliths are present). Loculated 4.5 x 4 x 3.8 cm loculated abscess surrounds/ envelops the ruptured appendix. Inflammatory process impresses upon the right fallopian tube/ adnexae and is immediately adjacent to terminal ileum. Prominent calcified plaque throughout the abdominal aorta, iliac arteries and femoral arteries with subsequent narrowing. Splenic artery calcifications with ectasia. Coronary artery calcifications. Enlarged liver spanning over 19.5 cm. Left upper pole 1.4 cm cyst. Tiny low-density structures in both kidneys may be cysts although too small to characterize. These results were called by telephone at the time of interpretation on 05/21/2016 at 3:47 pm to Dr. Nat Christen , who verbally acknowledged these results. Electronically Signed  By: Genia Del M.D.   On: 05/21/2016 16:09    Time Spent in minutes 25   Louellen Molder M.D on 05/26/2016 at 11:44 AM  Between 7am to 7pm - Pager - [phone]  After 7pm go to [email or web address] - password [facility] [place]  [facility] -  Office  [phone]

## 2016-05-26 NOTE — Progress Notes (Signed)
Wound vac site looks good.  Continue wound vac and set her up for home care.

## 2016-05-26 NOTE — Progress Notes (Signed)
I left message to Case manager Mariane Masters that the pt need to set up for wound vac for home care.

## 2016-05-26 NOTE — Progress Notes (Signed)
Wound care consult for wound vac set up for home care.

## 2016-05-26 NOTE — Progress Notes (Signed)
3 Days Post-Op  Subjective: CC:  Acute appendicitis with abscess She looks good this AM  Not taking allot in.  Walked x 2 yesterday.  Wound vac in place.  No flatus or BM.     Objective: Vital signs in last 24 hours: Temp:  [97.8 F (36.6 C)-98.8 F (37.1 C)] 98.8 F (37.1 C) (04/07 0600) Pulse Rate:  [73-86] 76 (04/07 0600) Resp:  [16-18] 16 (04/07 0600) BP: (113-139)/(65-69) 113/69 (04/07 0600) SpO2:  [94 %-97 %] 97 % (04/07 0600) Last BM Date: 05/23/16 (before surgery) 300 PO 2700 IV 2650 urine Drain 0 Afebrile, VSS No labs today NO films Intake/Output from previous day: 04/06 0701 - 04/07 0700 In: 2937.5 [P.O.:300; I.V.:2587.5; IV Piggyback:50] Out: 2650 [Urine:2650] Intake/Output this shift: No intake/output data recorded.  General appearance: alert and cooperative Resp: clear to auscultation bilaterally GI: soft BX hypoactive, No BM or flatus, wound vac in place will look at it with dressing change.  Lab Results:   Recent Labs  05/24/16 0420 05/25/16 0430  WBC 9.6 7.4  HGB 9.0* 8.1*  HCT 27.7* 25.1*  PLT 203 185    BMET  Recent Labs  05/24/16 0420 05/25/16 0430  NA 139 140  K 3.9 4.5  CL 111 109  CO2 22 21*  GLUCOSE 94 83  BUN 5* <5*  CREATININE 0.63 0.51  CALCIUM 7.7* 7.8*   PT/INR  Recent Labs  05/25/16 0430 05/26/16 0518  LABPROT 26.1* 24.6*  INR 2.35 2.18     Recent Labs Lab 05/21/16 1358 05/22/16 0535 05/23/16 0712  AST 22 19 25   ALT 16 16 19   ALKPHOS 101 91 79  BILITOT 1.1 1.5* 1.3*  PROT 7.6 6.4* 5.6*  ALBUMIN 3.6 3.0* 2.4*     Lipase     Component Value Date/Time   LIPASE 21 05/21/2016 1358     Studies/Results: No results found.  Medications: . acetaminophen  650 mg Oral Q6H   Or  . acetaminophen  650 mg Rectal Q6H  . ALPRAZolam  0.5 mg Oral QHS  . enoxaparin (LOVENOX) injection  75 mg Subcutaneous Q12H  . furosemide  40 mg Oral Daily  . gabapentin  600 mg Oral BID  . levothyroxine  100 mcg Oral QAC  breakfast  . mouth rinse  15 mL Mouth Rinse BID  . metoCLOPramide  5 mg Oral TID AC & HS  . piperacillin-tazobactam (ZOSYN)  IV  3.375 g Intravenous Q8H  . rOPINIRole  1 mg Oral QHS  . sodium chloride  1,000 mL Intravenous Once  . umeclidinium-vilanterol  1 puff Inhalation Daily  . Warfarin - Pharmacist Dosing Inpatient   Does not apply q1800   . 0.9 % NaCl with KCl 40 mEq / L 125 mL/hr (05/26/16 0558)   Specimen Description ABSCESS   Special Requests NONE   Gram Stain ABUNDANT WBC PRESENT,BOTH PMN AND MONONUCLEAR  FEW GRAM NEGATIVE RODS  FEW GRAM POSITIVE RODS  RARE GRAM POSITIVE COCCI IN PAIRS      Culture FEW PSEUDOMONAS AERUGINOSA  CULTURE REINCUBATED FOR BETTER GROWTH  HOLDING FOR POSSIBLE ANAEROBE       Assessment/Plan Acute appendicitis with abscess S/P open appendectomy/drainage of abscess, 05/23/16 Dr. Fanny Skates. 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 mechanical valve replacement.  Chronic anticoagulation secondary to mitral valve  Ray nonstress disease Peripheral neuropathy/history of fibromyalgia Hypothyroid FEN: IV fluids/full liquids ID: Zosyn 05/21/16 =>> day 6  Wound culture pending DVT:  Argatroban drip =>> coumadin/Lovenox    No change in plans currently.  I have ask her to walk more, do IS, continue full liquids for now.        LOS: 5 days    [name] 05/26/2016 [phone]

## 2016-05-26 NOTE — Progress Notes (Signed)
I paged on call  Dr. Ara Kussmaul regarding the oral thrush and requesting to change her pain meds because everytime she will get dilaudid she feel like nauseated, she wants to change her pain meds IV, endorsed to night shift.

## 2016-05-27 LAB — PROTIME-INR
INR: 2.04
Prothrombin Time: 23.4 seconds — ABNORMAL HIGH (ref 11.4–15.2)

## 2016-05-27 LAB — AEROBIC/ANAEROBIC CULTURE W GRAM STAIN (SURGICAL/DEEP WOUND)

## 2016-05-27 LAB — CBC
HCT: 30.6 % — ABNORMAL LOW (ref 36.0–46.0)
HEMOGLOBIN: 9.9 g/dL — AB (ref 12.0–15.0)
MCH: 28.6 pg (ref 26.0–34.0)
MCHC: 32.4 g/dL (ref 30.0–36.0)
MCV: 88.4 fL (ref 78.0–100.0)
PLATELETS: 267 10*3/uL (ref 150–400)
RBC: 3.46 MIL/uL — AB (ref 3.87–5.11)
RDW: 14.4 % (ref 11.5–15.5)
WBC: 7.8 10*3/uL (ref 4.0–10.5)

## 2016-05-27 LAB — AEROBIC/ANAEROBIC CULTURE (SURGICAL/DEEP WOUND)

## 2016-05-27 MED ORDER — PROMETHAZINE HCL 25 MG/ML IJ SOLN
6.2500 mg | Freq: Three times a day (TID) | INTRAMUSCULAR | Status: DC | PRN
  Administered 2016-05-27: 6.25 mg via INTRAVENOUS
  Filled 2016-05-27: qty 1

## 2016-05-27 MED ORDER — PHENOL 1.4 % MT LIQD
1.0000 | OROMUCOSAL | Status: DC | PRN
  Administered 2016-05-27: 1 via OROMUCOSAL
  Filled 2016-05-27: qty 177

## 2016-05-27 MED ORDER — PROMETHAZINE HCL 25 MG PO TABS
12.5000 mg | ORAL_TABLET | ORAL | Status: DC | PRN
  Administered 2016-05-28 – 2016-05-30 (×2): 12.5 mg via ORAL
  Filled 2016-05-27 (×3): qty 1

## 2016-05-27 MED ORDER — WARFARIN SODIUM 5 MG PO TABS
7.5000 mg | ORAL_TABLET | Freq: Once | ORAL | Status: AC
  Administered 2016-05-27: 7.5 mg via ORAL
  Filled 2016-05-27: qty 2

## 2016-05-27 MED ORDER — PROMETHAZINE HCL 25 MG/ML IJ SOLN
12.5000 mg | Freq: Four times a day (QID) | INTRAMUSCULAR | Status: DC | PRN
  Administered 2016-05-27 – 2016-05-29 (×6): 12.5 mg via INTRAVENOUS
  Filled 2016-05-27 (×6): qty 1

## 2016-05-27 MED ORDER — HYDROMORPHONE HCL 1 MG/ML IJ SOLN: 1.0000 mg | INTRAMUSCULAR | Status: DC | PRN

## 2016-05-27 MED ORDER — OXYCODONE-ACETAMINOPHEN 5-325 MG PO TABS
2.0000 | ORAL_TABLET | ORAL | Status: DC | PRN
  Administered 2016-05-27 – 2016-05-28 (×3): 2 via ORAL
  Filled 2016-05-27 (×3): qty 2

## 2016-05-27 MED ORDER — CIPROFLOXACIN HCL 500 MG PO TABS
500.0000 mg | ORAL_TABLET | Freq: Two times a day (BID) | ORAL | Status: DC
  Administered 2016-05-27 (×2): 500 mg via ORAL
  Filled 2016-05-27 (×2): qty 1

## 2016-05-27 NOTE — Progress Notes (Signed)
PROGRESS NOTE                                                                                                                                                                                                             Patient Demographics:    [name], is a 46 y.o. female, DOB - 12-17-1970, JEH:[id number]  Admit date - 05/21/2016   Admitting Physician Roney Jaffe, MD  Outpatient Primary MD for the patient is Purvis Kilts, MD  LOS - 6  Outpatient Specialists:Cardiology  Chief Complaint  Patient presents with  . Abdominal Pain  . 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 She initially presented to an in-hospital with CT of the abdomen and pelvis showed up inside his with rupture and associated4.5 x 4 x 3.8 cm loculated abscess enveloping the ruptured appendix. Surgery was consulted who recommended I consultation for percutaneous drainage. Patient was transferred to [facility]. IR evaluated the patient and recommended that there was no window for percutaneous drainage of her small abscess. Patient taken to OR for Open appendectomy on 4/4.   Subjective:   Abdominal pain continues to improve.   Assessment  & Plan :    Principal Problem:   Sepsis secondary to Appendicitis with abscess Underwent open appendectomy with drainage of abscess on 4/4. Wound VAC applied. Wound culture growing Pseudomonas, pansensitive. Narrowed antibiotics to ciprofloxacin (treat for total 10-14 days).  Pain control as needed.   Active Problems:   History of mitral valve replacement with mechanical valve Seen by cardiology for  preop clearance. Resume Coumadin postop, breezed with Lovenox. Resumed Lasix.   UTI Received empiric antibiotics.  Acute kidney injury Secondary to sepsis and dehydration. Resolved with fluids.  Hypokalemia/hypomagnesemia Replenished  Systemic lupus erythematosus -Patient is next due for Rituxan on 06/29/2016 -follows Dr. Berna Bue -intolerant to steroids -eye toxicity on plaquenil  Chronic pain -IV hydromorphone PRN  Autoimmune hepatitis  -Appears clinically compensated  Hypothyroidism -Continue Synthroid    Code Status : Full code  Family Communication  : Mother at bedside  Disposition  Plan  : Home city tomorrow  Barriers For Discharge : Improving symptoms  Consults  :   [place] [facility]  Procedures  :  CT abdomen and pelvis Open Appendectomy  DVT Prophylaxis  : Systemic anticoagulation  Lab Results  Component Value Date   PLT 267 05/27/2016    Antibiotics  :    Anti-infectives    Start     Dose/Rate Route Frequency Ordered Stop   05/27/16 0800  ciprofloxacin (CIPRO) tablet 500 mg     500 mg Oral 2 times daily 05/27/16 0741 06/06/16 0759   05/23/16 2330  piperacillin-tazobactam (ZOSYN) IVPB 3.375 g  Status:  Discontinued     3.375 g 12.5 mL/hr over 240 Minutes Intravenous Every 8 hours 05/23/16 1742 05/27/16 0741   05/21/16 1600  piperacillin-tazobactam (ZOSYN) IVPB 3.375 g  Status:  Discontinued     3.375 g 12.5 mL/hr over 240 Minutes Intravenous Every 8 hours 05/21/16 1556 05/23/16 1749        Objective:   Vitals:   05/26/16 2023 05/27/16 0458 05/27/16 0931 05/27/16 1330  BP: 123/83 (!) 128/94  125/68  Pulse: 73   72  Resp: 16 16    Temp: 98.6 F (37 C) 98.5 F (36.9 C)  99 F (37.2 C)  TempSrc: Oral Oral  Oral  SpO2: 94% 97% 95% 97%  Weight:      Height:        Wt Readings from Last 3 Encounters:  05/23/16 74.9 kg (165 lb 2 oz)  04/18/16 78.9 kg (174 lb)  03/22/16 75.8 kg (167 lb)     Intake/Output  Summary (Last 24 hours) at 05/27/16 1454 Last data filed at 05/27/16 1025  Gross per 24 hour  Intake              544 ml  Output             2900 ml  Net            -2356 ml     Physical Exam  Gen: not in distress HEENT: moist mucosa, supple neck Chest: clear b/l,  CVS:s1 Click, normal S2,  GI: soft,  ND, BS+, Midline incision with wound vac applied, Nontender Musculoskeletal: warm, no edema     Data Review:    CBC  Recent Labs Lab 05/22/16 0535 05/23/16 0712 05/24/16 0420 05/25/16 0430 05/27/16 0520  WBC 12.8* 11.6* 9.6 7.4 7.8  HGB 10.9* 9.6* 9.0* 8.1* 9.9*  HCT 32.2* 28.7* 27.7* 25.1* 30.6*  PLT 194 174 203 185 267  MCV 87.3 86.4 87.9 89.3 88.4  MCH 29.5 28.9 28.6 28.8 28.6  MCHC 33.9 33.4 32.5 32.3 32.4  RDW 14.1 14.2 14.7 14.8 14.4    Chemistries   Recent Labs Lab 05/21/16 1358 05/22/16 0535 05/23/16 0712 05/24/16 0420 05/25/16 0430  NA 135 139 138 139 140  K 3.0* 3.0* 2.7* 3.9 4.5  CL 102 109 109 111 109  CO2 19* 22 22 22  21*  GLUCOSE 88 95 108* 94 83  BUN 32* 21* 10 5* <5*  CREATININE 1.43* 0.89 0.69 0.63 0.51  CALCIUM 8.7* 8.5* 7.8* 7.7* 7.8*  MG  --   --  1.5* 2.0  --   AST 22 19 25   --   --   ALT 16 16 19   --   --   ALKPHOS 101 91 79  --   --   BILITOT 1.1 1.5* 1.3*  --   --    ------------------------------------------------------------------------------------------------------------------  No results for input(s): CHOL, HDL, LDLCALC, TRIG, CHOLHDL, LDLDIRECT in the last 72 hours.  No results found for: HGBA1C ------------------------------------------------------------------------------------------------------------------ No results for input(s): TSH, T4TOTAL, T3FREE, THYROIDAB in the last 72 hours.  Invalid input(s): FREET3 ------------------------------------------------------------------------------------------------------------------ No results for input(s): VITAMINB12, FOLATE, FERRITIN, TIBC, IRON, RETICCTPCT in the last 72  hours.  Coagulation profile  Recent Labs Lab 05/22/16 0535 05/24/16 0420 05/25/16 0430 05/26/16 0518 05/27/16 0520  INR 1.69 1.44 2.35 2.18 2.04    No results for input(s): DDIMER in the last 72 hours.  Cardiac Enzymes No results for input(s): CKMB, TROPONINI, MYOGLOBIN in the last 168 hours.  Invalid input(s): CK ------------------------------------------------------------------------------------------------------------------ No results found for: BNP  Inpatient Medications  Scheduled Meds: . acetaminophen  650 mg Oral Q6H   Or  . acetaminophen  650 mg Rectal Q6H  . acidophilus  1 capsule Oral Daily  . ALPRAZolam  0.5 mg Oral QHS  . ciprofloxacin  500 mg Oral BID  . clotrimazole  10 mg Oral 5 X Daily  . enoxaparin (LOVENOX) injection  75 mg Subcutaneous Q12H  . furosemide  40 mg Oral Daily  . gabapentin  600 mg Oral BID  . levothyroxine  100 mcg Oral QAC breakfast  . mouth rinse  15 mL Mouth Rinse BID  . metoCLOPramide  5 mg Oral TID AC & HS  . rOPINIRole  1 mg Oral QHS  . sodium chloride  1,000 mL Intravenous Once  . umeclidinium-vilanterol  1 puff Inhalation Daily  . warfarin  7.5 mg Oral ONCE-1800  . Warfarin - Pharmacist Dosing Inpatient   Does not apply q1800   Continuous Infusions:  PRN Meds:.HYDROmorphone (DILAUDID) injection, methocarbamol (ROBAXIN)  IV, oxyCODONE-acetaminophen, phenol, promethazine, promethazine, sodium chloride flush  Micro Results Recent Results (from the past 240 hour(s))  MRSA PCR Screening     Status: None   Collection Time: 05/23/16 12:57 PM  Result Value Ref Range Status   MRSA by PCR NEGATIVE NEGATIVE Final    Comment:        The GeneXpert MRSA Assay (FDA approved for NASAL specimens only), is one component of a comprehensive MRSA colonization surveillance program. It is not intended to diagnose MRSA infection nor to guide or monitor treatment for MRSA infections.   Fungus Culture With Stain     Status: None  (Preliminary result)   Collection Time: 05/23/16  3:56 PM  Result Value Ref Range Status   Fungus Stain Final report  Final    Comment: (NOTE) Performed At: [facility] [place], [place] [id number] Lindon Romp MD [id number]    Fungus (Mycology) Culture PENDING  Incomplete   Fungal Source ABSCESS  Final  Aerobic/Anaerobic Culture (surgical/deep wound)     Status: None (Preliminary result)   Collection Time: 05/23/16  3:56 PM  Result Value Ref Range Status   Specimen Description ABSCESS  Final   Special Requests NONE  Final   Gram Stain   Final    ABUNDANT WBC PRESENT,BOTH PMN AND MONONUCLEAR FEW GRAM NEGATIVE RODS FEW GRAM POSITIVE RODS RARE GRAM POSITIVE COCCI IN PAIRS    Culture   Final    FEW PSEUDOMONAS AERUGINOSA CULTURE REINCUBATED FOR BETTER GROWTH HOLDING FOR POSSIBLE ANAEROBE    Report Status PENDING  Incomplete   Organism ID, Bacteria PSEUDOMONAS AERUGINOSA  Final      Susceptibility   Pseudomonas aeruginosa - MIC*    CEFTAZIDIME 2 SENSITIVE Sensitive     CIPROFLOXACIN <=0.25 SENSITIVE Sensitive  GENTAMICIN <=1 SENSITIVE Sensitive     IMIPENEM <=0.25 SENSITIVE Sensitive     PIP/TAZO 8 SENSITIVE Sensitive     CEFEPIME <=1 SENSITIVE Sensitive     * FEW PSEUDOMONAS AERUGINOSA  Fungus Culture Result     Status: None   Collection Time: 05/23/16  3:56 PM  Result Value Ref Range Status   Result 1 Comment  Final    Comment: (NOTE) KOH/Calcofluor preparation:  no fungus observed. Performed At: [facility] [place], [place] [id number] Lindon Romp MD WN:[id number]   Culture, Urine     Status: None   Collection Time: 05/25/16  4:46 PM  Result Value Ref Range Status   Specimen Description URINE, RANDOM  Final   Special Requests NONE  Final   Culture NO GROWTH  Final   Report Status 05/26/2016 FINAL  Final    Radiology Reports Dg Chest 2 View  Result Date: 05/21/2016 CLINICAL DATA:  Nausea, vomiting,  diarrhea, and abdominal pain for the past 4 days. Former smoker. History of breast malignancy. EXAM: CHEST  2 VIEW COMPARISON:  PA and lateral chest x-ray of September 13, 2015 FINDINGS: The lungs are adequately inflated and clear. The heart and pulmonary vascularity are normal. There is calcification in the wall of the aortic arch. The mediastinum is normal in width. The bony thorax exhibits no acute abnormality. There postsurgical changes on the left with a breast implant present. IMPRESSION: There is no pneumonia, CHF, nor other acute cardiopulmonary abnormality. Thoracic aortic atherosclerosis. Electronically Signed   By: David  Martinique M.D.   On: 05/21/2016 12:49   Ct Abdomen Pelvis W Contrast  Result Date: 05/21/2016 CLINICAL DATA:  46 year old female with nausea, vomiting, diarrhea and abdominal pain since last week. Systemic lupus. Breast cancer post mastectomy. Mitral valve replacement. Initial encounter. EXAM: CT ABDOMEN AND PELVIS WITH CONTRAST TECHNIQUE: Multidetector CT imaging of the abdomen and pelvis was performed using the standard protocol following bolus administration of intravenous contrast. CONTRAST:  70mL ISOVUE-300 IOPAMIDOL (ISOVUE-300) INJECTION 61% COMPARISON:  02/10/2013 CT abdomen and pelvis. FINDINGS: Lower chest: Minimal basilar atelectasis/ scarring. Post left mastectomy with reconstruction and implant in place. Post mitral valve replacement. Heart size within normal limits. Coronary artery calcifications. Hepatobiliary: Enlarged liver spanning over 19.5 cm. No focal hepatic lesion. Postcholecystectomy. Pancreas: Similar configuration of the pancreas. Ectatic splenic artery with coarse calcifications. Spleen: Assessory splenic tissue. Spleen prominent in the anterior-posterior direction unchanged and within normal limits. Adrenals/Urinary Tract: No renal or ureteral obstructing stone or hydronephrosis. Left upper pole 1.4 cm cyst. Tiny low-density structures in both kidneys may be cysts  although too small to characterize. No adrenal lesion. No primary urinary bladder abnormality. Stomach/Bowel: Appendicitis with ruptured appendix (appendicoliths are present). Loculated 4.5 x 4 x 3.8 cm loculated abscess surrounds/ envelops the ruptured appendix. Inflammatory process impresses upon the right fallopian tube/ adnexae and is immediately adjacent to terminal ileum. Vascular/Lymphatic: Prominent calcified plaque throughout the abdominal aorta, iliac arteries and femoral arteries with subsequent narrowing. No focal aneurysm or large vessel occlusion. Reactive lymph nodes right pelvis. Reproductive: No primary adnexal abnormality. Other: No free air. Musculoskeletal: Degenerative changes L2-3 with disc space narrowing. IMPRESSION: Appendicitis with ruptured appendix (appendicoliths are present). Loculated 4.5 x 4 x 3.8 cm loculated abscess surrounds/ envelops the ruptured appendix. Inflammatory process impresses upon the right fallopian tube/ adnexae and is immediately adjacent to terminal ileum. Prominent calcified plaque throughout the abdominal aorta, iliac arteries and femoral arteries with subsequent narrowing. Splenic artery calcifications  with ectasia. Coronary artery calcifications. Enlarged liver spanning over 19.5 cm. Left upper pole 1.4 cm cyst. Tiny low-density structures in both kidneys may be cysts although too small to characterize. These results were called by telephone at the time of interpretation on 05/21/2016 at 3:47 pm to Dr. Nat Christen , who verbally acknowledged these results. Electronically Signed   By: Genia Del M.D.   On: 05/21/2016 16:09    Time Spent in minutes 25   Louellen Molder M.D on 05/27/2016 at 2:54 PM  Between 7am to 7pm - Pager - [phone]  After 7pm go to [email or web address] - password [facility]  [facility] -  Office  [phone]

## 2016-05-27 NOTE — Progress Notes (Signed)
4 Days Post-Op  Subjective: CC:  Acute appendicitis with abscess S/P open appendectomy/drainage of abscess, 05/23/16 Dr. Fanny Skates.   She looks good., Tolerating diet, positive bowel movement yesterday. Open wound was changed yesterday and she is doing well with the wound VAC. Chronic pain remains an issue she's taking Tylenol regularly along with Dilaudid. It looks like she took the oxycodone 1 yesterday and Phenergan 4 yesterday.   Objective: Vital signs in last 24 hours: Temp:  [98.5 F (36.9 C)-98.8 F (37.1 C)] 98.5 F (36.9 C) (04/08 0458) Pulse Rate:  [73-75] 73 (04/07 2023) Resp:  [16] 16 (04/08 0458) BP: (120-128)/(61-94) 128/94 (04/08 0458) SpO2:  [94 %-100 %] 97 % (04/08 0458) Last BM Date: 05/23/16 684 PO recorded  100 IV Urine 4950 Drain 50 BM x 1 Afebrile, VSS CBC is normal INR 2.04 UA is normal   Intake/Output from previous day: 04/07 0701 - 04/08 0700 In: 784 [P.O.:684; IV Piggyback:100] Out: 5000 [Urine:4950; Drains:50] Intake/Output this shift: No intake/output data recorded.  General appearance: alert, cooperative and no distress Resp: clear to auscultation bilaterally GI: Positive bowel sounds, wound VAC in place. She is not distended. Tolerating diet well with bowel movement yesterday.  Lab Results:   Recent Labs  05/25/16 0430 05/27/16 0520  WBC 7.4 7.8  HGB 8.1* 9.9*  HCT 25.1* 30.6*  PLT 185 267    BMET  Recent Labs  05/25/16 0430  NA 140  K 4.5  CL 109  CO2 21*  GLUCOSE 83  BUN <5*  CREATININE 0.51  CALCIUM 7.8*   PT/INR  Recent Labs  05/26/16 0518 05/27/16 0520  LABPROT 24.6* 23.4*  INR 2.18 2.04     Recent Labs Lab 05/21/16 1358 05/22/16 0535 05/23/16 0712  AST 22 19 25   ALT 16 16 19   ALKPHOS 101 91 79  BILITOT 1.1 1.5* 1.3*  PROT 7.6 6.4* 5.6*  ALBUMIN 3.6 3.0* 2.4*     Lipase     Component Value Date/Time   LIPASE 21 05/21/2016 1358     Studies/Results: No results  found.  Medications: . acetaminophen  650 mg Oral Q6H   Or  . acetaminophen  650 mg Rectal Q6H  . acidophilus  1 capsule Oral Daily  . ALPRAZolam  0.5 mg Oral QHS  . ciprofloxacin  500 mg Oral BID  . clotrimazole  10 mg Oral 5 X Daily  . enoxaparin (LOVENOX) injection  75 mg Subcutaneous Q12H  . furosemide  40 mg Oral Daily  . gabapentin  600 mg Oral BID  . levothyroxine  100 mcg Oral QAC breakfast  . mouth rinse  15 mL Mouth Rinse BID  . metoCLOPramide  5 mg Oral TID AC & HS  . rOPINIRole  1 mg Oral QHS  . sodium chloride  1,000 mL Intravenous Once  . umeclidinium-vilanterol  1 puff Inhalation Daily  . warfarin  7.5 mg Oral ONCE-1800  . Warfarin - Pharmacist Dosing Inpatient   Does not apply q1800   No IV fluids listed  Specimen Description ABSCESS   Special Requests NONE   Gram Stain ABUNDANT WBC PRESENT,BOTH PMN AND MONONUCLEAR  FEW GRAM NEGATIVE RODS  FEW GRAM POSITIVE RODS  RARE GRAM POSITIVE COCCI IN PAIRS      Culture FEW PSEUDOMONAS AERUGINOSA  CULTURE REINCUBATED FOR BETTER GROWTH  HOLDING FOR POSSIBLE ANAEROBE      Report Status PENDING   Organism ID, Bacteria PSEUDOMONAS AERUGINOSA    Sensitive to everything including Zosyn  Assessment/Plan Acute appendicitis with abscess S/P open appendectomy/drainage of abscess, 05/23/16 Dr. Fanny Skates. 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 Chronic anticoagulation secondary to mitral valve Reynauds Disease Peripheral neuropathy/history of fibromyalgia Hypothyroid FEN: IV fluids/full liquids ID: Zosyn 05/21/16 =>>day 7;  Wound culture above DVT:  coumadin/Lovenox  INR 2.04   Plan: Check on duration of antibiotics. Advance diet. Work on pain control with by mouth medications. If she does well with regular diet and oral medication should be able to go home in the next 24-48 hours.    LOS: 6 days     Teigen Bellin 05/27/2016 [phone]

## 2016-05-27 NOTE — Progress Notes (Signed)
ANTICOAGULATION CONSULT NOTE - Follow Up Consult  Pharmacy Consult for Coumadin Indication: mechanical heart valve  Allergies  Allergen Reactions  . 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 kph    Patient Measurements: Height: 5\' 4"  (162.6 cm) Weight: 165 lb 2 oz (74.9 kg) IBW/kg (Calculated) : 54.7  Vital Signs: Temp: 98.5 F (36.9 C) (04/08 0458) Temp Source: Oral (04/08 0458) BP: 128/94 (04/08 0458) Pulse Rate: 73 (04/07 2023)  Labs:  Recent Labs  05/24/16 1314 05/24/16 1554 05/25/16 0430 05/26/16 0518 05/27/16 0520  HGB  --   --  8.1*  --  9.9*  HCT  --   --  25.1*  --  30.6*  PLT  --   --  185  --  267  APTT 73* 68* 90*  --   --   LABPROT  --   --  26.1* 24.6* 23.4*  INR  --   --  2.35 2.18 2.04  CREATININE  --   --  0.51  --   --     Estimated Creatinine Clearance: 86.2 mL/min (by C-G formula based on SCr of 0.51 mg/dL).   Medications:  Scheduled:  . acetaminophen  650 mg Oral Q6H   Or  . acetaminophen  650 mg Rectal Q6H  . acidophilus  1 capsule Oral Daily  . ALPRAZolam  0.5 mg Oral QHS  . clotrimazole  10 mg Oral 5 X Daily  . enoxaparin (LOVENOX) injection  75 mg Subcutaneous Q12H  . furosemide  40 mg Oral Daily  . gabapentin  600 mg Oral BID  . levothyroxine  100 mcg Oral QAC breakfast  . mouth rinse  15 mL Mouth Rinse BID  . metoCLOPramide  5 mg Oral TID AC & HS  . piperacillin-tazobactam (ZOSYN)  IV  3.375 g Intravenous Q8H  . rOPINIRole  1 mg Oral QHS  . sodium chloride  1,000 mL Intravenous Once  . umeclidinium-vilanterol  1 puff Inhalation Daily  . Warfarin - Pharmacist Dosing Inpatient   Does not apply q1800    Assessment: 33 YOF with St. Jude's mechanical valve with elevated INR on admission at [facility],  received vitamin K.  Pt with remote history of HIT. She stated that this was in 2008 at time of heart valve replacement and that she  has received Lovenox several times since then with no issues. She is comfortable with receiving Lovenox.  INR is decreased a bit today.  Hg and pltc are both improved, monitoring q48 due to hx HIT.  No bleeding noted.  Goal of Therapy:  INR 2.5-3.5 Monitor platelets by anticoagulation protocol: Yes   Plan:  Continue Lovenox 75mg  SQ q12 Repeat Coumadin 7.5mg  po today Daily INR CBC q48   Gracy Bruins, PharmD Clinical Pharmacist [facility]

## 2016-05-28 ENCOUNTER — Inpatient Hospital Stay (HOSPITAL_COMMUNITY): Payer: BLUE CROSS/BLUE SHIELD

## 2016-05-28 DIAGNOSIS — R112 Nausea with vomiting, unspecified: Secondary | ICD-10-CM

## 2016-05-28 LAB — COMPREHENSIVE METABOLIC PANEL
ALBUMIN: 2.4 g/dL — AB (ref 3.5–5.0)
ALT: 32 U/L (ref 14–54)
AST: 56 U/L — AB (ref 15–41)
Alkaline Phosphatase: 174 U/L — ABNORMAL HIGH (ref 38–126)
Anion gap: 7 (ref 5–15)
CHLORIDE: 99 mmol/L — AB (ref 101–111)
CO2: 30 mmol/L (ref 22–32)
Calcium: 8.1 mg/dL — ABNORMAL LOW (ref 8.9–10.3)
Creatinine, Ser: 0.5 mg/dL (ref 0.44–1.00)
GFR calc Af Amer: >60 mL/min (ref >60)
GFR calc non Af Amer: >60 mL/min (ref >60)
Glucose, Bld: 95 mg/dL (ref 65–99)
POTASSIUM: 3.1 mmol/L — AB (ref 3.5–5.1)
Sodium: 136 mmol/L (ref 135–145)
Total Bilirubin: 0.5 mg/dL (ref 0.3–1.2)
Total Protein: 5.2 g/dL — ABNORMAL LOW (ref 6.5–8.1)

## 2016-05-28 LAB — PROTIME-INR
INR: 2.91
PROTHROMBIN TIME: 31.1 s — AB (ref 11.4–15.2)

## 2016-05-28 MED ORDER — MAGIC MOUTHWASH W/LIDOCAINE
5.0000 mL | Freq: Four times a day (QID) | ORAL | Status: DC | PRN
  Administered 2016-05-28 – 2016-05-30 (×6): 5 mL via ORAL
  Filled 2016-05-28 (×8): qty 5

## 2016-05-28 MED ORDER — KETOROLAC TROMETHAMINE 15 MG/ML IJ SOLN
15.0000 mg | Freq: Four times a day (QID) | INTRAMUSCULAR | Status: DC | PRN
  Administered 2016-05-28 – 2016-05-30 (×7): 15 mg via INTRAVENOUS
  Filled 2016-05-28 (×7): qty 1

## 2016-05-28 MED ORDER — PIPERACILLIN-TAZOBACTAM 3.375 G IVPB
3.3750 g | Freq: Three times a day (TID) | INTRAVENOUS | Status: DC
  Administered 2016-05-28 – 2016-05-29 (×4): 3.375 g via INTRAVENOUS
  Filled 2016-05-28 (×5): qty 50

## 2016-05-28 MED ORDER — WARFARIN SODIUM 3 MG PO TABS
3.0000 mg | ORAL_TABLET | Freq: Once | ORAL | Status: AC
  Administered 2016-05-28: 3 mg via ORAL
  Filled 2016-05-28: qty 1

## 2016-05-28 MED ORDER — ONDANSETRON HCL 4 MG/2ML IJ SOLN
4.0000 mg | Freq: Four times a day (QID) | INTRAMUSCULAR | Status: DC | PRN
  Administered 2016-05-28 – 2016-05-29 (×4): 4 mg via INTRAVENOUS
  Filled 2016-05-28 (×4): qty 2

## 2016-05-28 MED ORDER — MAGIC MOUTHWASH: 5.0000 mL | Freq: Four times a day (QID) | ORAL | Status: DC | PRN

## 2016-05-28 NOTE — Progress Notes (Addendum)
PROGRESS NOTE                                                                                                                                                                                                             Patient Demographics:    [name], is a 46 y.o. female, DOB - 02-07-1971, GYK:[id number]  Admit date - 05/21/2016   Admitting Physician Roney Jaffe, MD  Outpatient Primary MD for the patient is Purvis Kilts, MD  LOS - 7  Outpatient Specialists:Cardiology  Chief Complaint  Patient presents with  . Abdominal Pain  . 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 She initially presented to an in-hospital with CT of the abdomen and pelvis showed up inside his with rupture and associated4.5 x 4 x 3.8 cm loculated abscess enveloping the ruptured appendix. Surgery was consulted who recommended I consultation for percutaneous drainage. Patient was transferred to [facility]. IR evaluated the patient and recommended that there was no window for percutaneous drainage of her small abscess. Patient taken to OR for Open appendectomy on 4/4.   Subjective:   Nauseous with several episodes of vomiting since yesterday afternoon. Reports eating mashed potatoes and so promptly exceed.   Assessment  & Plan :    Principal Problem:   Sepsis secondary to Appendicitis with abscess Underwent open appendectomy with drainage of abscess on 4/4. Wound VAC applied. Wound culture growing Pseudomonas, pansensitive. Narrowed antibiotics to ciprofloxacin to treat for total 10-14 days. Antibiotic switch to IV Zosyn for persistent  vomiting. Pain control with Tylenol and when necessary Toradol.   Active Problems:  Nausea and vomiting since 4/8. Next pain. I suspect could be acute gastroenteritis due to eating outside food versus narcotic use. I doubt oral ciprofloxacin is causing this. Switched to clear liquid. Antibiotic switch to IV Zosyn. Discontinued oxycodone and placed on when necessary IV Toradol. Advance diet later this evening if tolerating.  Hypokalemia Replenished     History of mitral valve replacement with mechanical valve Seen by cardiology for preop clearance. Resume Coumadin postop. Now  therapeutic. Discontinue Lovenox bridge. Continue Lasix.   UTI Received empiric antibiotics.  Acute kidney injury Secondary to sepsis and dehydration. Resolved with fluids.  Hypokalemia/hypomagnesemia Replenished  Systemic lupus erythematosus -Patient is next due for Rituxan on 06/29/2016 -follows Dr. Berna Bue -intolerant to steroids -eye toxicity on plaquenil  Chronic pain -IV hydromorphone PRN  Autoimmune hepatitis  -Appears clinically compensated  Hypothyroidism -Continue Synthroid    Code Status : Full code  Family Communication  : Mother at bedside  Disposition Plan  : Home submitted tomorrow if GI symptoms improved  Barriers For Discharge : Nausea and vomiting.  Consults  :   [facility]  Procedures  :  CT abdomen and pelvis Open Appendectomy  DVT Prophylaxis  : Systemic anticoagulation  Lab Results  Component Value Date   PLT 267 05/27/2016    Antibiotics  :    Anti-infectives    Start     Dose/Rate Route Frequency Ordered Stop   05/28/16 1000  piperacillin-tazobactam (ZOSYN) IVPB 3.375 g     3.375 g 12.5 mL/hr over 240 Minutes Intravenous Every 8 hours 05/28/16 0900     05/27/16 0800  ciprofloxacin (CIPRO) tablet 500 mg  Status:  Discontinued     500 mg Oral 2 times daily 05/27/16 0741 05/28/16 0900   05/23/16 2330   piperacillin-tazobactam (ZOSYN) IVPB 3.375 g  Status:  Discontinued     3.375 g 12.5 mL/hr over 240 Minutes Intravenous Every 8 hours 05/23/16 1742 05/27/16 0741   05/21/16 1600  piperacillin-tazobactam (ZOSYN) IVPB 3.375 g  Status:  Discontinued     3.375 g 12.5 mL/hr over 240 Minutes Intravenous Every 8 hours 05/21/16 1556 05/23/16 1749        Objective:   Vitals:   05/27/16 1330 05/27/16 2032 05/28/16 0552 05/28/16 1428  BP: 125/68 131/84 111/61 124/60  Pulse: 72 81 70 78  Resp:  18 18   Temp: 99 F (37.2 C) 98.4 F (36.9 C) 97.8 F (36.6 C) 98.2 F (36.8 C)  TempSrc: Oral Oral Oral Oral  SpO2: 97% 97% 94% 97%  Weight:      Height:        Wt Readings from Last 3 Encounters:  05/23/16 74.9 kg (165 lb 2 oz)  04/18/16 78.9 kg (174 lb)  03/22/16 75.8 kg (167 lb)     Intake/Output Summary (Last 24 hours) at 05/28/16 1519 Last data filed at 05/28/16 1348  Gross per 24 hour  Intake              400 ml  Output             1250 ml  Net             -850 ml     Physical Exam  Gen: not in distress HEENT: moist mucosa, supple neck Chest: clear b/l,  CVS:s1 Click, normal S2,  GI: soft,  ND, BS+, Midline incision with wound vac applied, Minimal tenderness Musculoskeletal: warm, no edema   Data Review:    CBC  Recent Labs Lab 05/22/16 0535 05/23/16 0712 05/24/16 0420 05/25/16 0430 05/27/16 0520  WBC 12.8* 11.6* 9.6 7.4 7.8  HGB 10.9* 9.6* 9.0* 8.1* 9.9*  HCT 32.2* 28.7* 27.7* 25.1* 30.6*  PLT 194 174 203 185 267  MCV 87.3 86.4 87.9 89.3 88.4  MCH 29.5 28.9 28.6 28.8 28.6  MCHC 33.9 33.4 32.5 32.3 32.4  RDW 14.1 14.2 14.7 14.8 14.4    Chemistries   Recent Labs Lab  05/22/16 0535 05/23/16 0712 05/24/16 0420 05/25/16 0430 05/28/16 1026  NA 139 138 139 140 136  K 3.0* 2.7* 3.9 4.5 3.1*  CL 109 109 111 109 99*  CO2 22 22 22  21* 30  GLUCOSE 95 108* 94 83 95  BUN 21* 10 5* <5* <5*  CREATININE 0.89 0.69 0.63 0.51 0.50  CALCIUM 8.5* 7.8* 7.7* 7.8*  8.1*  MG  --  1.5* 2.0  --   --   AST 19 25  --   --  56*  ALT 16 19  --   --  32  ALKPHOS 91 79  --   --  174*  BILITOT 1.5* 1.3*  --   --  0.5   ------------------------------------------------------------------------------------------------------------------ No results for input(s): CHOL, HDL, LDLCALC, TRIG, CHOLHDL, LDLDIRECT in the last 72 hours.  No results found for: HGBA1C ------------------------------------------------------------------------------------------------------------------ No results for input(s): TSH, T4TOTAL, T3FREE, THYROIDAB in the last 72 hours.  Invalid input(s): FREET3 ------------------------------------------------------------------------------------------------------------------ No results for input(s): VITAMINB12, FOLATE, FERRITIN, TIBC, IRON, RETICCTPCT in the last 72 hours.  Coagulation profile  Recent Labs Lab 05/24/16 0420 05/25/16 0430 05/26/16 0518 05/27/16 0520 05/28/16 0505  INR 1.44 2.35 2.18 2.04 2.91    No results for input(s): DDIMER in the last 72 hours.  Cardiac Enzymes No results for input(s): CKMB, TROPONINI, MYOGLOBIN in the last 168 hours.  Invalid input(s): CK ------------------------------------------------------------------------------------------------------------------ No results found for: BNP  Inpatient Medications  Scheduled Meds: . acetaminophen  650 mg Oral Q6H   Or  . acetaminophen  650 mg Rectal Q6H  . acidophilus  1 capsule Oral Daily  . ALPRAZolam  0.5 mg Oral QHS  . clotrimazole  10 mg Oral 5 X Daily  . furosemide  40 mg Oral Daily  . gabapentin  600 mg Oral BID  . levothyroxine  100 mcg Oral QAC breakfast  . mouth rinse  15 mL Mouth Rinse BID  . piperacillin-tazobactam (ZOSYN)  IV  3.375 g Intravenous Q8H  . rOPINIRole  1 mg Oral QHS  . sodium chloride  1,000 mL Intravenous Once  . umeclidinium-vilanterol  1 puff Inhalation Daily  . Warfarin - Pharmacist Dosing Inpatient   Does not apply q1800    Continuous Infusions:  PRN Meds:.ketorolac, ondansetron, phenol, promethazine, promethazine, sodium chloride flush  Micro Results Recent Results (from the past 240 hour(s))  MRSA PCR Screening     Status: None   Collection Time: 05/23/16 12:57 PM  Result Value Ref Range Status   MRSA by PCR NEGATIVE NEGATIVE Final    Comment:        The GeneXpert MRSA Assay (FDA approved for NASAL specimens only), is one component of a comprehensive MRSA colonization surveillance program. It is not intended to diagnose MRSA infection nor to guide or monitor treatment for MRSA infections.   Fungus Culture With Stain     Status: None (Preliminary result)   Collection Time: 05/23/16  3:56 PM  Result Value Ref Range Status   Fungus Stain Final report  Final    Comment: (NOTE) Performed At: [facility] [place], [place] [id number] Lindon Romp MD YO:[id number]    Fungus (Mycology) Culture PENDING  Incomplete   Fungal Source ABSCESS  Final  Aerobic/Anaerobic Culture (surgical/deep wound)     Status: None   Collection Time: 05/23/16  3:56 PM  Result Value Ref Range Status   Specimen Description ABSCESS  Final   Special Requests NONE  Final   Gram Stain  Final    ABUNDANT WBC PRESENT,BOTH PMN AND MONONUCLEAR FEW GRAM NEGATIVE RODS FEW GRAM POSITIVE RODS RARE GRAM POSITIVE COCCI IN PAIRS    Culture   Final    FEW PSEUDOMONAS AERUGINOSA MIXED ANAEROBIC FLORA PRESENT.  CALL LAB IF FURTHER IID REQUIRED.    Report Status 05/27/2016 FINAL  Final   Organism ID, Bacteria PSEUDOMONAS AERUGINOSA  Final      Susceptibility   Pseudomonas aeruginosa - MIC*    CEFTAZIDIME 2 SENSITIVE Sensitive     CIPROFLOXACIN <=0.25 SENSITIVE Sensitive     GENTAMICIN <=1 SENSITIVE Sensitive     IMIPENEM <=0.25 SENSITIVE Sensitive     PIP/TAZO 8 SENSITIVE Sensitive     CEFEPIME <=1 SENSITIVE Sensitive     * FEW PSEUDOMONAS AERUGINOSA  Fungus Culture Result     Status: None    Collection Time: 05/23/16  3:56 PM  Result Value Ref Range Status   Result 1 Comment  Final    Comment: (NOTE) KOH/Calcofluor preparation:  no fungus observed. Performed At: [facility] [place], [place] [id number] Lindon Romp MD QQ:[id number]   Culture, Urine     Status: None   Collection Time: 05/25/16  4:46 PM  Result Value Ref Range Status   Specimen Description URINE, RANDOM  Final   Special Requests NONE  Final   Culture NO GROWTH  Final   Report Status 05/26/2016 FINAL  Final    Radiology Reports Dg Chest 2 View  Result Date: 05/21/2016 CLINICAL DATA:  Nausea, vomiting, diarrhea, and abdominal pain for the past 4 days. Former smoker. History of breast malignancy. EXAM: CHEST  2 VIEW COMPARISON:  PA and lateral chest x-ray of September 13, 2015 FINDINGS: The lungs are adequately inflated and clear. The heart and pulmonary vascularity are normal. There is calcification in the wall of the aortic arch. The mediastinum is normal in width. The bony thorax exhibits no acute abnormality. There postsurgical changes on the left with a breast implant present. IMPRESSION: There is no pneumonia, CHF, nor other acute cardiopulmonary abnormality. Thoracic aortic atherosclerosis. Electronically Signed   By: David  Martinique M.D.   On: 05/21/2016 12:49   Ct Abdomen Pelvis W Contrast  Result Date: 05/21/2016 CLINICAL DATA:  46 year old female with nausea, vomiting, diarrhea and abdominal pain since last week. Systemic lupus. Breast cancer post mastectomy. Mitral valve replacement. Initial encounter. EXAM: CT ABDOMEN AND PELVIS WITH CONTRAST TECHNIQUE: Multidetector CT imaging of the abdomen and pelvis was performed using the standard protocol following bolus administration of intravenous contrast. CONTRAST:  97mL ISOVUE-300 IOPAMIDOL (ISOVUE-300) INJECTION 61% COMPARISON:  02/10/2013 CT abdomen and pelvis. FINDINGS: Lower chest: Minimal basilar atelectasis/ scarring. Post left  mastectomy with reconstruction and implant in place. Post mitral valve replacement. Heart size within normal limits. Coronary artery calcifications. Hepatobiliary: Enlarged liver spanning over 19.5 cm. No focal hepatic lesion. Postcholecystectomy. Pancreas: Similar configuration of the pancreas. Ectatic splenic artery with coarse calcifications. Spleen: Assessory splenic tissue. Spleen prominent in the anterior-posterior direction unchanged and within normal limits. Adrenals/Urinary Tract: No renal or ureteral obstructing stone or hydronephrosis. Left upper pole 1.4 cm cyst. Tiny low-density structures in both kidneys may be cysts although too small to characterize. No adrenal lesion. No primary urinary bladder abnormality. Stomach/Bowel: Appendicitis with ruptured appendix (appendicoliths are present). Loculated 4.5 x 4 x 3.8 cm loculated abscess surrounds/ envelops the ruptured appendix. Inflammatory process impresses upon the right fallopian tube/ adnexae and is immediately adjacent to terminal ileum. Vascular/Lymphatic: Prominent calcified plaque  throughout the abdominal aorta, iliac arteries and femoral arteries with subsequent narrowing. No focal aneurysm or large vessel occlusion. Reactive lymph nodes right pelvis. Reproductive: No primary adnexal abnormality. Other: No free air. Musculoskeletal: Degenerative changes L2-3 with disc space narrowing. IMPRESSION: Appendicitis with ruptured appendix (appendicoliths are present). Loculated 4.5 x 4 x 3.8 cm loculated abscess surrounds/ envelops the ruptured appendix. Inflammatory process impresses upon the right fallopian tube/ adnexae and is immediately adjacent to terminal ileum. Prominent calcified plaque throughout the abdominal aorta, iliac arteries and femoral arteries with subsequent narrowing. Splenic artery calcifications with ectasia. Coronary artery calcifications. Enlarged liver spanning over 19.5 cm. Left upper pole 1.4 cm cyst. Tiny low-density  structures in both kidneys may be cysts although too small to characterize. These results were called by telephone at the time of interpretation on 05/21/2016 at 3:47 pm to Dr. Nat Christen , who verbally acknowledged these results. Electronically Signed   By: Genia Del M.D.   On: 05/21/2016 16:09   Dg Abd 2 Views  Result Date: 05/28/2016 CLINICAL DATA:  Recent appendectomy with nausea and vomiting EXAM: ABDOMEN - 2 VIEW COMPARISON:  05/21/2016 FINDINGS: Scattered large and small bowel gas is noted. No obstructive changes are seen. No definitive free air is seen. Mild left basilar atelectasis is noted. No abnormal mass or abnormal calcifications are seen. IMPRESSION: No obstructive pattern is noted. Mild left basilar atelectasis. Electronically Signed   By: Inez Catalina M.D.   On: 05/28/2016 13:39    Time Spent in minutes 35   Louellen Molder M.D on 05/28/2016 at 3:19 PM  Between 7am to 7pm - Pager - [phone]  After 7pm go to [email or web address] - password [facility]  [facility] -  Office  [phone]

## 2016-05-28 NOTE — Progress Notes (Signed)
ANTICOAGULATION CONSULT NOTE - Follow Up Consult  Pharmacy Consult for Coumadin Indication: mechanical heart valve  Allergies  Allergen Reactions  . 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 kph    Patient Measurements: Height: 5\' 4"  (162.6 cm) Weight: 165 lb 2 oz (74.9 kg) IBW/kg (Calculated) : 54.7  Vital Signs: Temp: 97.8 F (36.6 C) (04/09 0552) Temp Source: Oral (04/09 0552) BP: 111/61 (04/09 0552) Pulse Rate: 70 (04/09 0552)  Labs:  Recent Labs  05/26/16 0518 05/27/16 0520 05/28/16 0505  HGB  --  9.9*  --   HCT  --  30.6*  --   PLT  --  267  --   LABPROT 24.6* 23.4* 31.1*  INR 2.18 2.04 2.91    Estimated Creatinine Clearance: 86.2 mL/min (by C-G formula based on SCr of 0.51 mg/dL).   Medications:  Scheduled:  . acetaminophen  650 mg Oral Q6H   Or  . acetaminophen  650 mg Rectal Q6H  . acidophilus  1 capsule Oral Daily  . ALPRAZolam  0.5 mg Oral QHS  . ciprofloxacin  500 mg Oral BID  . clotrimazole  10 mg Oral 5 X Daily  . enoxaparin (LOVENOX) injection  75 mg Subcutaneous Q12H  . furosemide  40 mg Oral Daily  . gabapentin  600 mg Oral BID  . levothyroxine  100 mcg Oral QAC breakfast  . mouth rinse  15 mL Mouth Rinse BID  . metoCLOPramide  5 mg Oral TID AC & HS  . rOPINIRole  1 mg Oral QHS  . sodium chloride  1,000 mL Intravenous Once  . umeclidinium-vilanterol  1 puff Inhalation Daily  . Warfarin - Pharmacist Dosing Inpatient   Does not apply q1800    Assessment: 30 YOF with St. Jude's mechanical valve with elevated INR on admission at [facility],  received vitamin K.  INR up to 2.91 today, significant increase from downward trend prior.  Hg and pltc improved and pltc wnl on 4/8 with addition of Lovenox 4/7.  No bleeding noted.  Will decrease Coumadin dose today to slow INR rise and hopefully avoid supratherapeutic level.  Possibly d/c home today per  Surgery on 4/8.  Goal of Therapy:  INR 2.5-3.5 Monitor platelets by anticoagulation protocol: Yes   Plan:  Continue Lovenox 75mg  SQ q12 Coumadin 3mg  po x 1 today Daily INR CBC q48 Recommend Coumadin 5mg  qday with next INR 4/11 or 4/12, if discharged.   Gracy Bruins, PharmD Clinical Pharmacist [facility]

## 2016-05-28 NOTE — Progress Notes (Signed)
5 Days Post-Op   CC:  Acute appendicitis with abscess S/P open appendectomy/drainage of abscess, 05/23/16 Dr. Fanny Skates.  Subjective: Started vomiting yesterday afternoon and trying to eat thru it.  Does not want the NG back in.  Her abdomen is soft.  She was put on Cipro yesterday, also started on Percocet.  Got phenergan x 3 yesterday.  I am going to put her back to clears for now.  Will discuss with Dr. Clementeen Graham.      Objective: Vital signs in last 24 hours: Temp:  [97.8 F (36.6 C)-99 F (37.2 C)] 97.8 F (36.6 C) (04/09 0552) Pulse Rate:  [70-81] 70 (04/09 0552) Resp:  [18] 18 (04/09 0552) BP: (111-131)/(61-84) 111/61 (04/09 0552) SpO2:  [94 %-97 %] 94 % (04/09 0552) Last BM Date: 05/23/16 Vomiting this  AM3 492 Po yesterday Afebrile, VSS INR 2.91 WBC is normal   Intake/Output from previous day: 04/08 0701 - 04/09 0700 In: 492 [P.O.:492] Out: 2800 [Urine:2800] Intake/Output this shift: No intake/output data recorded.  General appearance: alert, cooperative, no distress and trying to eat a soft breakfast, after vomiting. Resp: clear to auscultation bilaterally GI: soft, few BS, not distended.  no peritonitis.  Wound vac in place.  Lab Results:   Recent Labs  05/27/16 0520  WBC 7.8  HGB 9.9*  HCT 30.6*  PLT 267    BMET No results for input(s): NA, K, CL, CO2, GLUCOSE, BUN, CREATININE, CALCIUM in the last 72 hours. PT/INR  Recent Labs  05/27/16 0520 05/28/16 0505  LABPROT 23.4* 31.1*  INR 2.04 2.91     Recent Labs Lab 05/21/16 1358 05/22/16 0535 05/23/16 0712  AST 22 19 25   ALT 16 16 19   ALKPHOS 101 91 79  BILITOT 1.1 1.5* 1.3*  PROT 7.6 6.4* 5.6*  ALBUMIN 3.6 3.0* 2.4*     Lipase     Component Value Date/Time   LIPASE 21 05/21/2016 1358     Studies/Results: No results found. Prior to Admission medications   Medication Sig Start Date End Date Taking? Authorizing Provider  Albuterol (VENTOLIN IN) Inhale into the lungs as needed.    Yes Historical Provider, MD  ALPRAZolam Duanne Moron) 0.5 MG tablet Take 0.5 mg by mouth at bedtime.  11/02/13  Yes Historical Provider, MD  ANORO ELLIPTA 62.5-25 MCG/INH AEPB Inhale 1 puff into the lungs daily.  05/15/16  Yes Historical Provider, MD  cyanocobalamin (,VITAMIN B-12,) 1000 MCG/ML injection Inject 1 mL (1,000 mcg total) into the muscle every 30 (thirty) days. 12/19/10  Yes Everardo All, MD  dexlansoprazole (DEXILANT) 60 MG capsule Take 1 capsule (60 mg total) by mouth daily. 06/16/15  Yes Annitta Needs, NP  furosemide (LASIX) 40 MG tablet TAKE ONE TABLET BY MOUTH ONCE DAILY. 02/07/16  Yes Satira Sark, MD  gabapentin (NEURONTIN) 600 MG tablet Take 600 mg by mouth 2 (two) times daily.    Yes Historical Provider, MD  ibuprofen (ADVIL,MOTRIN) 800 MG tablet Take 800 mg by mouth every 6 (six) hours as needed. Reported on 08/30/2015   Yes Historical Provider, MD  levothyroxine (SYNTHROID, LEVOTHROID) 100 MCG tablet Take 1 tablet (100 mcg total) by mouth daily. 12/27/15  Yes Cassandria Anger, MD  metoCLOPramide (REGLAN) 5 MG tablet Take 5 mg by mouth 2 (two) times daily.   Yes Historical Provider, MD  oxyCODONE-acetaminophen (PERCOCET) 7.5-325 MG per tablet Take 1 tablet by mouth every 4 (four) hours as needed for pain. Reported on 08/30/2015   Yes Historical Provider,  MD  potassium chloride (KLOR-CON 10) 10 MEQ tablet Take one tablet daily as directed 11/09/13  Yes Historical Provider, MD  riTUXimab (RITUXAN) 100 MG/10ML injection Inject into the vein. Every 4 months   Yes Historical Provider, MD  rOPINIRole (REQUIP) 0.5 MG tablet Take 1 mg by mouth at bedtime.    Yes Historical Provider, MD  warfarin (COUMADIN) 5 MG tablet Take 2 tablets daily except 1 1/2 tablets on Mondays, Wednesdays and Fridays 01/16/16  Yes Satira Sark, MD  zolpidem (AMBIEN) 10 MG tablet Take 10 mg by mouth at bedtime as needed for sleep.  12/19/10  Yes Ezra Sites, MD  temazepam (RESTORIL) 30 MG capsule Take 1  capsule (30 mg total) by mouth at bedtime as needed for sleep. Patient not taking: Reported on 05/21/2016 04/18/16   Holley Bouche, NP    Medications: . acetaminophen  650 mg Oral Q6H   Or  . acetaminophen  650 mg Rectal Q6H  . acidophilus  1 capsule Oral Daily  . ALPRAZolam  0.5 mg Oral QHS  . ciprofloxacin  500 mg Oral BID  . clotrimazole  10 mg Oral 5 X Daily  . enoxaparin (LOVENOX) injection  75 mg Subcutaneous Q12H  . furosemide  40 mg Oral Daily  . gabapentin  600 mg Oral BID  . levothyroxine  100 mcg Oral QAC breakfast  . mouth rinse  15 mL Mouth Rinse BID  . metoCLOPramide  5 mg Oral TID AC & HS  . rOPINIRole  1 mg Oral QHS  . sodium chloride  1,000 mL Intravenous Once  . umeclidinium-vilanterol  1 puff Inhalation Daily  . Warfarin - Pharmacist Dosing Inpatient   Does not apply q1800    Assessment/Plan Acute appendicitis with abscess S/P open appendectomy/drainage of abscess, 05/23/16 Dr. Fanny Skates. 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 Chronic anticoagulation secondary to mitral valve Reynauds Disease Peripheral neuropathy/history of fibromyalgia Hypothyroid FEN: IV fluids/soft diet ID: Zosyn 05/21/16 - 05/26/16 converted to Cipro on 05/27/16 PO/Medicine Wound culture:  Bacteria PSEUDOMONAS AERUGINOSA    DVT:  coumadin/Lovenox  INR 2.91   Plan: Discussed with Dr. Clementeen Graham, we are going back to Zosyn, she also reports issues with pain meds.  We will try one change at a time.  He is going to stop Reglan also.        LOS: 7 days    Sixto Bowdish 05/28/2016 [phone]

## 2016-05-28 NOTE — Care Management Note (Addendum)
Case Management Note  Patient Details  Name: [name] MRN: [id number] Date of Birth: [birth year]  Subjective/Objective:                    Action/Plan:  [employer] VAC has been approved and will be delivered to patient's hospital room this pm or tomorrow am.  Spoke to patient at bedside regarding home VAC . Patient voiced understanding.   Patient will be staying with her daughter at discharge, address: [address] , [place], [place] [address] cell phone number is [phone].  Will fax VAC application to [employer]. Expected Discharge Date:                  Expected Discharge Plan:  [place]  In-House Referral:  NA  Discharge planning Services  CM Consult  Post Acute Care Choice:  Home Health Choice offered to:  Patient  DME Arranged:  Vac DME Agency:  [employer]  HH Arranged:  RN [place] Agency:  [facility]  Status of Service:  In process, will continue to follow  If discussed at Long Length of Stay Meetings, dates discussed:    Additional Comments:  Marilu Favre, RN 05/28/2016, 10:18 AM

## 2016-05-28 NOTE — Consult Note (Addendum)
WOC consult requested for Vac assistance.  Surgical team is following for assessment and plan of care. Refer to previous progress note on 4/5 for assessment and measurements.  Current Vac dressing is intact to abd wound with good seal and patient states it was changed on Sat.  Order has been placed for bedside nurses to change Vac dressing Q Tues/Thurs/Sat. Please re-consult if further assistance is needed.  Thank-you,  Julien Girt MSN, [place], [place], [place], [place]

## 2016-05-29 DIAGNOSIS — G43A0 Cyclical vomiting, not intractable: Secondary | ICD-10-CM

## 2016-05-29 LAB — BASIC METABOLIC PANEL
Anion gap: 11 (ref 5–15)
BUN: <5 mg/dL — ABNORMAL LOW (ref 6–20)
CALCIUM: 7.9 mg/dL — AB (ref 8.9–10.3)
CO2: 30 mmol/L (ref 22–32)
CREATININE: 0.66 mg/dL (ref 0.44–1.00)
Chloride: 99 mmol/L — ABNORMAL LOW (ref 101–111)
GFR calc non Af Amer: >60 mL/min (ref >60)
GLUCOSE: 86 mg/dL (ref 65–99)
Potassium: 2.9 mmol/L — ABNORMAL LOW (ref 3.5–5.1)
Sodium: 140 mmol/L (ref 135–145)

## 2016-05-29 LAB — CBC
HCT: 28.1 % — ABNORMAL LOW (ref 36.0–46.0)
Hemoglobin: 9.1 g/dL — ABNORMAL LOW (ref 12.0–15.0)
MCH: 28.8 pg (ref 26.0–34.0)
MCHC: 32.4 g/dL (ref 30.0–36.0)
MCV: 88.9 fL (ref 78.0–100.0)
PLATELETS: 252 10*3/uL (ref 150–400)
RBC: 3.16 MIL/uL — AB (ref 3.87–5.11)
RDW: 14.6 % (ref 11.5–15.5)
WBC: 4.7 10*3/uL (ref 4.0–10.5)

## 2016-05-29 LAB — PROTIME-INR
INR: 3.27
Prothrombin Time: 34.1 seconds — ABNORMAL HIGH (ref 11.4–15.2)

## 2016-05-29 MED ORDER — WARFARIN SODIUM 6 MG PO TABS
6.0000 mg | ORAL_TABLET | Freq: Once | ORAL | Status: AC
  Administered 2016-05-29: 6 mg via ORAL
  Filled 2016-05-29: qty 1

## 2016-05-29 MED ORDER — PROMETHAZINE HCL 25 MG/ML IJ SOLN
25.0000 mg | Freq: Four times a day (QID) | INTRAMUSCULAR | Status: DC | PRN
  Administered 2016-05-29 – 2016-05-30 (×4): 25 mg via INTRAVENOUS
  Filled 2016-05-29 (×4): qty 1

## 2016-05-29 MED ORDER — POTASSIUM CHLORIDE CRYS ER 20 MEQ PO TBCR
40.0000 meq | EXTENDED_RELEASE_TABLET | ORAL | Status: AC
  Administered 2016-05-29 (×2): 40 meq via ORAL
  Filled 2016-05-29 (×2): qty 2

## 2016-05-29 MED ORDER — MAGNESIUM SULFATE 2 GM/50ML IV SOLN
2.0000 g | Freq: Once | INTRAVENOUS | Status: AC
  Administered 2016-05-29: 2 g via INTRAVENOUS
  Filled 2016-05-29: qty 50

## 2016-05-29 MED ORDER — OXYCODONE-ACETAMINOPHEN 5-325 MG PO TABS
1.0000 | ORAL_TABLET | ORAL | Status: DC | PRN
  Administered 2016-05-29: 2 via ORAL
  Filled 2016-05-29: qty 2

## 2016-05-29 MED ORDER — ONDANSETRON HCL 4 MG/2ML IJ SOLN
4.0000 mg | Freq: Three times a day (TID) | INTRAMUSCULAR | Status: DC
  Administered 2016-05-29: 4 mg via INTRAVENOUS
  Filled 2016-05-29: qty 2

## 2016-05-29 NOTE — Progress Notes (Signed)
PROGRESS NOTE                                                                                                                                                                                                             Patient Demographics:    [name], is a 46 y.o. female, DOB - 1970-11-05, YDX:[id number]  Admit date - 05/21/2016   Admitting Physician Roney Jaffe, MD  Outpatient Primary MD for the patient is Purvis Kilts, MD  LOS - 8  Outpatient Specialists:Cardiology  Chief Complaint  Patient presents with  . Abdominal Pain  . 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 She initially presented to an in-hospital with CT of the abdomen and pelvis showed up inside his with rupture and associated 4.5 x 4 x 3.8 cm loculated abscess enveloping the ruptured appendix. Surgery was consulted who recommended I consultation for percutaneous drainage. Patient was transferred to [facility]. IR evaluated the patient and recommended that there was no window for percutaneous drainage of her small abscess. Patient taken to OR for Open appendectomy on 4/4.   Subjective:   Still nauseous with episodes of vomiting. Complains of pain around the wound VAC site.   Assessment  & Plan :    Principal Problem:   Sepsis secondary to Appendicitis with abscess Underwent open appendectomy with drainage of abscess on 4/4. Wound VAC applied. Wound culture growing Pseudomonas, pansensitive. Empiric IV Zosyn. (Okay for discontinuing antibiotic for surgery, has received for 7 days already) Pain control with Tylenol and when necessary  Toradol.   Active Problems:  Nausea and vomiting since 4/8. No signs of ileus. Switch to clear liquid. Surgery will look at the wound with wound VAC is removed today. Continue full liquid. Antiemetics.  Hypokalemia Secondary to GI loss. Replenished     History of mitral valve replacement with mechanical valve Seen by cardiology for preop clearance. Resumed Coumadin postop. INR therapeutic. Marland Kitchen Continue Lasix.   UTI Received empiric antibiotics.  Acute kidney injury Secondary to sepsis and dehydration. Resolved with fluids.  Hypokalemia/hypomagnesemia Replenished  Systemic lupus erythematosus -Patient is next due for Rituxan on 06/29/2016 -follows Dr. Berna Bue -intolerant to steroids -eye toxicity on plaquenil   Autoimmune hepatitis  -Appears clinically compensated  Hypothyroidism -Continue Synthroid    Code Status : Full code  Family Communication  : Mother at bedside  Disposition Plan  : Home once nausea improves and tolerating advanced diet  Barriers For Discharge : Nausea and vomiting.  Consults  :   [facility]  Procedures  :  CT abdomen and pelvis Open Appendectomy  DVT Prophylaxis  : Systemic anticoagulation  Lab Results  Component Value Date   PLT 252 05/29/2016    Antibiotics  :    Anti-infectives    Start     Dose/Rate Route Frequency Ordered Stop   05/28/16 1000  piperacillin-tazobactam (ZOSYN) IVPB 3.375 g  Status:  Discontinued     3.375 g 12.5 mL/hr over 240 Minutes Intravenous Every 8 hours 05/28/16 0900 05/29/16 1208   05/27/16 0800  ciprofloxacin (CIPRO) tablet 500 mg  Status:  Discontinued     500 mg Oral 2 times daily 05/27/16 0741 05/28/16 0900   05/23/16 2330  piperacillin-tazobactam (ZOSYN) IVPB 3.375 g  Status:  Discontinued     3.375 g 12.5 mL/hr over 240 Minutes Intravenous Every 8 hours 05/23/16 1742 05/27/16 0741   05/21/16 1600  piperacillin-tazobactam (ZOSYN) IVPB 3.375 g  Status:   Discontinued     3.375 g 12.5 mL/hr over 240 Minutes Intravenous Every 8 hours 05/21/16 1556 05/23/16 1749        Objective:   Vitals:   05/28/16 1428 05/28/16 2055 05/29/16 0500 05/29/16 0800  BP: 124/60 (!) 139/55 121/65   Pulse: 78 85 85   Resp:  20 20   Temp: 98.2 F (36.8 C) 99.5 F (37.5 C) 99.1 F (37.3 C) 99.3 F (37.4 C)  TempSrc: Oral Oral Oral   SpO2: 97% 94% 95%   Weight:      Height:        Wt Readings from Last 3 Encounters:  05/23/16 74.9 kg (165 lb 2 oz)  04/18/16 78.9 kg (174 lb)  03/22/16 75.8 kg (167 lb)     Intake/Output Summary (Last 24 hours) at 05/29/16 1300 Last data filed at 05/29/16 0902  Gross per 24 hour  Intake              580 ml  Output              112 ml  Net              468 ml     Physical Exam  Gen: not in distress HEENT: moist mucosa, supple neck Chest: clear b/l,  CVS:s1 Click, normal S2,  GI: soft,  ND, BS+, Midline incision with wound vac, Minimal tenderness Musculoskeletal: warm, no edema   Data Review:    CBC  Recent Labs Lab 05/23/16 0712 05/24/16 0420 05/25/16 0430 05/27/16 0520 05/29/16 0453  WBC 11.6* 9.6 7.4 7.8 4.7  HGB 9.6* 9.0* 8.1* 9.9* 9.1*  HCT 28.7* 27.7* 25.1* 30.6* 28.1*  PLT 174 203 185 267 252  MCV 86.4 87.9 89.3 88.4 88.9  MCH 28.9 28.6 28.8 28.6 28.8  MCHC 33.4 32.5 32.3 32.4 32.4  RDW 14.2 14.7 14.8 14.4 14.6    Chemistries   Recent Labs Lab 05/23/16 0712 05/24/16 0420 05/25/16 0430 05/28/16 1026 05/29/16 0453  NA 138 139 140 136 140  K 2.7* 3.9 4.5 3.1* 2.9*  CL  109 111 109 99* 99*  CO2 22 22 21* 30 30  GLUCOSE 108* 94 83 95 86  BUN 10 5* <5* <5* <5*  CREATININE 0.69 0.63 0.51 0.50 0.66  CALCIUM 7.8* 7.7* 7.8* 8.1* 7.9*  MG 1.5* 2.0  --   --   --   AST 25  --   --  56*  --   ALT 19  --   --  32  --   ALKPHOS 79  --   --  174*  --   BILITOT 1.3*  --   --  0.5  --     ------------------------------------------------------------------------------------------------------------------ No results for input(s): CHOL, HDL, LDLCALC, TRIG, CHOLHDL, LDLDIRECT in the last 72 hours.  No results found for: HGBA1C ------------------------------------------------------------------------------------------------------------------ No results for input(s): TSH, T4TOTAL, T3FREE, THYROIDAB in the last 72 hours.  Invalid input(s): FREET3 ------------------------------------------------------------------------------------------------------------------ No results for input(s): VITAMINB12, FOLATE, FERRITIN, TIBC, IRON, RETICCTPCT in the last 72 hours.  Coagulation profile  Recent Labs Lab 05/25/16 0430 05/26/16 0518 05/27/16 0520 05/28/16 0505 05/29/16 0453  INR 2.35 2.18 2.04 2.91 3.27    No results for input(s): DDIMER in the last 72 hours.  Cardiac Enzymes No results for input(s): CKMB, TROPONINI, MYOGLOBIN in the last 168 hours.  Invalid input(s): CK ------------------------------------------------------------------------------------------------------------------ No results found for: BNP  Inpatient Medications  Scheduled Meds: . acetaminophen  650 mg Oral Q6H   Or  . acetaminophen  650 mg Rectal Q6H  . acidophilus  1 capsule Oral Daily  . ALPRAZolam  0.5 mg Oral QHS  . clotrimazole  10 mg Oral 5 X Daily  . furosemide  40 mg Oral Daily  . gabapentin  600 mg Oral BID  . levothyroxine  100 mcg Oral QAC breakfast  . mouth rinse  15 mL Mouth Rinse BID  . ondansetron (ZOFRAN) IV  4 mg Intravenous Q8H  . rOPINIRole  1 mg Oral QHS  . sodium chloride  1,000 mL Intravenous Once  . umeclidinium-vilanterol  1 puff Inhalation Daily  . warfarin  6 mg Oral ONCE-1800  . Warfarin - Pharmacist Dosing Inpatient   Does not apply q1800   Continuous Infusions:  PRN Meds:.ketorolac, magic mouthwash w/lidocaine, oxyCODONE-acetaminophen, phenol, promethazine,  promethazine, sodium chloride flush  Micro Results Recent Results (from the past 240 hour(s))  MRSA PCR Screening     Status: None   Collection Time: 05/23/16 12:57 PM  Result Value Ref Range Status   MRSA by PCR NEGATIVE NEGATIVE Final    Comment:        The GeneXpert MRSA Assay (FDA approved for NASAL specimens only), is one component of a comprehensive MRSA colonization surveillance program. It is not intended to diagnose MRSA infection nor to guide or monitor treatment for MRSA infections.   Fungus Culture With Stain     Status: None (Preliminary result)   Collection Time: 05/23/16  3:56 PM  Result Value Ref Range Status   Fungus Stain Final report  Final    Comment: (NOTE) Performed At: [facility] [place], [place] [id number] Lindon Romp MD KG:[id number]    Fungus (Mycology) Culture PENDING  Incomplete   Fungal Source ABSCESS  Final  Aerobic/Anaerobic Culture (surgical/deep wound)     Status: None   Collection Time: 05/23/16  3:56 PM  Result Value Ref Range Status   Specimen Description ABSCESS  Final   Special Requests NONE  Final   Gram Stain   Final    ABUNDANT WBC PRESENT,BOTH PMN  AND MONONUCLEAR FEW GRAM NEGATIVE RODS FEW GRAM POSITIVE RODS RARE GRAM POSITIVE COCCI IN PAIRS    Culture   Final    FEW PSEUDOMONAS AERUGINOSA MIXED ANAEROBIC FLORA PRESENT.  CALL LAB IF FURTHER IID REQUIRED.    Report Status 05/27/2016 FINAL  Final   Organism ID, Bacteria PSEUDOMONAS AERUGINOSA  Final      Susceptibility   Pseudomonas aeruginosa - MIC*    CEFTAZIDIME 2 SENSITIVE Sensitive     CIPROFLOXACIN <=0.25 SENSITIVE Sensitive     GENTAMICIN <=1 SENSITIVE Sensitive     IMIPENEM <=0.25 SENSITIVE Sensitive     PIP/TAZO 8 SENSITIVE Sensitive     CEFEPIME <=1 SENSITIVE Sensitive     * FEW PSEUDOMONAS AERUGINOSA  Fungus Culture Result     Status: None   Collection Time: 05/23/16  3:56 PM  Result Value Ref Range Status   Result 1 Comment   Final    Comment: (NOTE) KOH/Calcofluor preparation:  no fungus observed. Performed At: [facility] [place], [place] [id number] Lindon Romp MD SJ:[id number]   Culture, Urine     Status: None   Collection Time: 05/25/16  4:46 PM  Result Value Ref Range Status   Specimen Description URINE, RANDOM  Final   Special Requests NONE  Final   Culture NO GROWTH  Final   Report Status 05/26/2016 FINAL  Final    Radiology Reports Dg Chest 2 View  Result Date: 05/21/2016 CLINICAL DATA:  Nausea, vomiting, diarrhea, and abdominal pain for the past 4 days. Former smoker. History of breast malignancy. EXAM: CHEST  2 VIEW COMPARISON:  PA and lateral chest x-ray of September 13, 2015 FINDINGS: The lungs are adequately inflated and clear. The heart and pulmonary vascularity are normal. There is calcification in the wall of the aortic arch. The mediastinum is normal in width. The bony thorax exhibits no acute abnormality. There postsurgical changes on the left with a breast implant present. IMPRESSION: There is no pneumonia, CHF, nor other acute cardiopulmonary abnormality. Thoracic aortic atherosclerosis. Electronically Signed   By: David  Martinique M.D.   On: 05/21/2016 12:49   Ct Abdomen Pelvis W Contrast  Result Date: 05/21/2016 CLINICAL DATA:  46 year old female with nausea, vomiting, diarrhea and abdominal pain since last week. Systemic lupus. Breast cancer post mastectomy. Mitral valve replacement. Initial encounter. EXAM: CT ABDOMEN AND PELVIS WITH CONTRAST TECHNIQUE: Multidetector CT imaging of the abdomen and pelvis was performed using the standard protocol following bolus administration of intravenous contrast. CONTRAST:  66mL ISOVUE-300 IOPAMIDOL (ISOVUE-300) INJECTION 61% COMPARISON:  02/10/2013 CT abdomen and pelvis. FINDINGS: Lower chest: Minimal basilar atelectasis/ scarring. Post left mastectomy with reconstruction and implant in place. Post mitral valve replacement. Heart size  within normal limits. Coronary artery calcifications. Hepatobiliary: Enlarged liver spanning over 19.5 cm. No focal hepatic lesion. Postcholecystectomy. Pancreas: Similar configuration of the pancreas. Ectatic splenic artery with coarse calcifications. Spleen: Assessory splenic tissue. Spleen prominent in the anterior-posterior direction unchanged and within normal limits. Adrenals/Urinary Tract: No renal or ureteral obstructing stone or hydronephrosis. Left upper pole 1.4 cm cyst. Tiny low-density structures in both kidneys may be cysts although too small to characterize. No adrenal lesion. No primary urinary bladder abnormality. Stomach/Bowel: Appendicitis with ruptured appendix (appendicoliths are present). Loculated 4.5 x 4 x 3.8 cm loculated abscess surrounds/ envelops the ruptured appendix. Inflammatory process impresses upon the right fallopian tube/ adnexae and is immediately adjacent to terminal ileum. Vascular/Lymphatic: Prominent calcified plaque throughout the abdominal aorta, iliac arteries and femoral  arteries with subsequent narrowing. No focal aneurysm or large vessel occlusion. Reactive lymph nodes right pelvis. Reproductive: No primary adnexal abnormality. Other: No free air. Musculoskeletal: Degenerative changes L2-3 with disc space narrowing. IMPRESSION: Appendicitis with ruptured appendix (appendicoliths are present). Loculated 4.5 x 4 x 3.8 cm loculated abscess surrounds/ envelops the ruptured appendix. Inflammatory process impresses upon the right fallopian tube/ adnexae and is immediately adjacent to terminal ileum. Prominent calcified plaque throughout the abdominal aorta, iliac arteries and femoral arteries with subsequent narrowing. Splenic artery calcifications with ectasia. Coronary artery calcifications. Enlarged liver spanning over 19.5 cm. Left upper pole 1.4 cm cyst. Tiny low-density structures in both kidneys may be cysts although too small to characterize. These results were called  by telephone at the time of interpretation on 05/21/2016 at 3:47 pm to Dr. Nat Christen , who verbally acknowledged these results. Electronically Signed   By: Genia Del M.D.   On: 05/21/2016 16:09   Dg Abd 2 Views  Result Date: 05/28/2016 CLINICAL DATA:  Recent appendectomy with nausea and vomiting EXAM: ABDOMEN - 2 VIEW COMPARISON:  05/21/2016 FINDINGS: Scattered large and small bowel gas is noted. No obstructive changes are seen. No definitive free air is seen. Mild left basilar atelectasis is noted. No abnormal mass or abnormal calcifications are seen. IMPRESSION: No obstructive pattern is noted. Mild left basilar atelectasis. Electronically Signed   By: Inez Catalina M.D.   On: 05/28/2016 13:39    Time Spent in minutes 35   Louellen Molder M.D on 05/29/2016 at 1:00 PM  Between 7am to 7pm - Pager - [phone]  After 7pm go to [email or web address] - password [facility]  [facility] -  Office  [phone]

## 2016-05-29 NOTE — Progress Notes (Signed)
ANTICOAGULATION CONSULT NOTE - Follow Up Consult  Pharmacy Consult:  Coumadin Indication: mechanical heart valve  Allergies  Allergen Reactions  . 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 kph    Patient Measurements: Height: 5\' 4"  (162.6 cm) Weight: 165 lb 2 oz (74.9 kg) IBW/kg (Calculated) : 54.7  Vital Signs: Temp: 99.3 F (37.4 C) (04/10 0800) Temp Source: Oral (04/10 0500) BP: 121/65 (04/10 0500) Pulse Rate: 85 (04/10 0500)  Labs:  Recent Labs  05/27/16 0520 05/28/16 0505 05/28/16 1026 05/29/16 0453  HGB 9.9*  --   --  9.1*  HCT 30.6*  --   --  28.1*  PLT 267  --   --  252  LABPROT 23.4* 31.1*  --  34.1*  INR 2.04 2.91  --  3.27  CREATININE  --   --  0.50 0.66    Estimated Creatinine Clearance: 86.2 mL/min (by C-G formula based on SCr of 0.66 mg/dL).   Assessment: [name] with history of St. Jude's mechanical valve admitted with elevated INR and was given Vitamin K prior to transfer to [facility].  Now off Cipro and INR remains within the therapeutic range after yesterday's dose reduction.  No bleeding reported.  Home Coumadin dose: 10mg  qday except 7.5mg  MWF   Goal of Therapy:  INR 2.5-3.5 Monitor platelets by anticoagulation protocol: Yes    Plan:  - Coumadin 6mg  PO today - Daily INR, CBC Q48H - Monitor for s/sx bleeding - Watch LFTs with scheduled APAP   [name] D. Mina Marble, PharmD, BCPS Pager:  [phone] 05/29/2016, 10:39 AM

## 2016-05-29 NOTE — Progress Notes (Signed)
6 Days Post-Op  Subjective: Still having nausea, back on Zosyn, tylenol, and Toradol for pain.  Took Phenergren 4 times yesterday  Objective: Vital signs in last 24 hours: Temp:  [98.2 F (36.8 C)-99.5 F (37.5 C)] 99.3 F (37.4 C) (04/10 0800) Pulse Rate:  [78-85] 85 (04/10 0500) Resp:  [20] 20 (04/10 0500) BP: (121-139)/(55-65) 121/65 (04/10 0500) SpO2:  [94 %-97 %] 95 % (04/10 0500) Last BM Date: 05/28/16 480 PO Voided x 2  Drain 10 Afebrile, VSS K+ 2.9 WBC is 4.7  Film yesterday 2 view abd :  Scattered large and small bowel gas is noted. No obstructive changes are seen. No definitive free air is seen. Mild left basilar atelectasis is noted. No abnormal mass or abnormal calcifications are seen.  Intake/Output from previous day: 04/09 0701 - 04/10 0700 In: 590 [P.O.:480; I.V.:10; IV Piggyback:100] Out: 12 [Urine:2; Drains:10] Intake/Output this shift: No intake/output data recorded.  General appearance: alert, cooperative and no distress Resp: clear to auscultation bilaterally GI: soft sore, will look at wound with wound vac down today.    Lab Results:   Recent Labs  05/27/16 0520 05/29/16 0453  WBC 7.8 4.7  HGB 9.9* 9.1*  HCT 30.6* 28.1*  PLT 267 252    BMET  Recent Labs  05/28/16 1026 05/29/16 0453  NA 136 140  K 3.1* 2.9*  CL 99* 99*  CO2 30 30  GLUCOSE 95 86  BUN <5* <5*  CREATININE 0.50 0.66  CALCIUM 8.1* 7.9*   PT/INR  Recent Labs  05/28/16 0505 05/29/16 0453  LABPROT 31.1* 34.1*  INR 2.91 3.27     Recent Labs Lab 05/23/16 0712 05/28/16 1026  AST 25 56*  ALT 19 32  ALKPHOS 79 174*  BILITOT 1.3* 0.5  PROT 5.6* 5.2*  ALBUMIN 2.4* 2.4*     Lipase     Component Value Date/Time   LIPASE 21 05/21/2016 1358     Studies/Results: Dg Abd 2 Views  Result Date: 05/28/2016 CLINICAL DATA:  Recent appendectomy with nausea and vomiting EXAM: ABDOMEN - 2 VIEW COMPARISON:  05/21/2016 FINDINGS: Scattered large and small bowel gas is  noted. No obstructive changes are seen. No definitive free air is seen. Mild left basilar atelectasis is noted. No abnormal mass or abnormal calcifications are seen. IMPRESSION: No obstructive pattern is noted. Mild left basilar atelectasis. Electronically Signed   By: Inez Catalina M.D.   On: 05/28/2016 13:39    Medications: . acetaminophen  650 mg Oral Q6H   Or  . acetaminophen  650 mg Rectal Q6H  . acidophilus  1 capsule Oral Daily  . ALPRAZolam  0.5 mg Oral QHS  . clotrimazole  10 mg Oral 5 X Daily  . furosemide  40 mg Oral Daily  . gabapentin  600 mg Oral BID  . levothyroxine  100 mcg Oral QAC breakfast  . mouth rinse  15 mL Mouth Rinse BID  . piperacillin-tazobactam (ZOSYN)  IV  3.375 g Intravenous Q8H  . potassium chloride  40 mEq Oral Q4H  . rOPINIRole  1 mg Oral QHS  . sodium chloride  1,000 mL Intravenous Once  . umeclidinium-vilanterol  1 puff Inhalation Daily  . Warfarin - Pharmacist Dosing Inpatient   Does not apply q1800   No IV fluids  Assessment/Plan Acute appendicitis with abscess S/P open appendectomy/drainage of abscess, 05/23/16 Dr. Fanny Skates. 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 Chronic anticoagulation secondary to mitral valve Reynauds Disease Peripheral neuropathy/history of fibromyalgia Hypothyroid FEN: full liquids ID: Zosyn 05/21/16 - 05/26/16 converted to Cipro on 05/27/16 PO/Medicine Wound culture:  Bacteria PSEUDOMONAS AERUGINOSA   Day 9 antibiotics  - discussed duration of antibiotics with Dr. Donne Hazel and it is his opinion 7 days should be adequate for coverage.   DVT:  Coumadin  -   INR 3.27  PlaN:  Nausea is still an issue.  We will need to look at the wound when the wound vac comes down.  From our standpoint we can stop the antibiotics.   K+ needs to be replaced also.  I will leave her on Full liquids for now.           LOS: 8 days    Brandon Scarbrough 05/29/2016 [phone]

## 2016-05-29 NOTE — Progress Notes (Signed)
Received call from [facility] that patient had 7 beats V-tach, Dr. Clementeen Graham made aware.

## 2016-05-30 DIAGNOSIS — R109 Unspecified abdominal pain: Secondary | ICD-10-CM

## 2016-05-30 LAB — BASIC METABOLIC PANEL
Anion gap: 7 (ref 5–15)
BUN: <5 mg/dL — ABNORMAL LOW (ref 6–20)
CHLORIDE: 103 mmol/L (ref 101–111)
CO2: 29 mmol/L (ref 22–32)
CREATININE: 0.66 mg/dL (ref 0.44–1.00)
Calcium: 8 mg/dL — ABNORMAL LOW (ref 8.9–10.3)
GFR calc non Af Amer: >60 mL/min (ref >60)
Glucose, Bld: 94 mg/dL (ref 65–99)
Potassium: 3.3 mmol/L — ABNORMAL LOW (ref 3.5–5.1)
SODIUM: 139 mmol/L (ref 135–145)

## 2016-05-30 LAB — PROTIME-INR
INR: 3.5
Prothrombin Time: 35.9 seconds — ABNORMAL HIGH (ref 11.4–15.2)

## 2016-05-30 MED ORDER — ONDANSETRON HCL 4 MG/2ML IJ SOLN
4.0000 mg | Freq: Four times a day (QID) | INTRAMUSCULAR | Status: DC | PRN
  Administered 2016-05-30: 4 mg via INTRAVENOUS
  Filled 2016-05-30 (×2): qty 2

## 2016-05-30 MED ORDER — OXYCODONE-ACETAMINOPHEN 5-325 MG PO TABS: 1.0000 | ORAL_TABLET | ORAL | 0 refills | Status: DC | PRN

## 2016-05-30 MED ORDER — PANTOPRAZOLE SODIUM 40 MG PO TBEC
40.0000 mg | DELAYED_RELEASE_TABLET | Freq: Every day | ORAL | Status: DC
  Administered 2016-05-30: 40 mg via ORAL
  Filled 2016-05-30: qty 1

## 2016-05-30 MED ORDER — PROCHLORPERAZINE EDISYLATE 5 MG/ML IJ SOLN
10.0000 mg | Freq: Four times a day (QID) | INTRAMUSCULAR | Status: DC | PRN
  Administered 2016-05-30: 10 mg via INTRAVENOUS
  Filled 2016-05-30: qty 2

## 2016-05-30 MED ORDER — WARFARIN SODIUM 3 MG PO TABS
3.0000 mg | ORAL_TABLET | Freq: Once | ORAL | Status: AC
  Administered 2016-05-30: 3 mg via ORAL
  Filled 2016-05-30: qty 1

## 2016-05-30 MED ORDER — PROMETHAZINE HCL 12.5 MG PO TABS
12.5000 mg | ORAL_TABLET | ORAL | 0 refills | Status: DC | PRN
Start: 2016-05-30 — End: 2017-10-09

## 2016-05-30 MED ORDER — MAGIC MOUTHWASH W/LIDOCAINE: 5.0000 mL | Freq: Four times a day (QID) | ORAL | 0 refills | Status: DC

## 2016-05-30 MED ORDER — MAGIC MOUTHWASH W/LIDOCAINE
5.0000 mL | Freq: Four times a day (QID) | ORAL | Status: DC
  Administered 2016-05-30 (×2): 5 mL via ORAL
  Filled 2016-05-30 (×3): qty 5

## 2016-05-30 MED ORDER — SODIUM CHLORIDE 0.9 % IV SOLN
30.0000 meq | Freq: Once | INTRAVENOUS | Status: AC
  Administered 2016-05-30: 30 meq via INTRAVENOUS
  Filled 2016-05-30: qty 15

## 2016-05-30 NOTE — Discharge Summary (Signed)
Physician Discharge Summary   Patient ID: [name] MRN: [id number] DOB/AGE: Nov 21, 1970 46 y.o.  Admit date: 05/21/2016 Discharge date: 05/30/2016  Primary Care Physician:  Purvis Kilts, MD  Discharge Diagnoses:    . Systemic lupus erythematosus ([place]) . hypokalemia  . Appendicitis with abscess . 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 Please repeat CBC/BMET at next visit    DIET:SOFT diet     Allergies:   Allergies  Allergen Reactions  . 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 kph     DISCHARGE MEDICATIONS: Current Discharge Medication List    START taking these medications   Details  magic mouthwash w/lidocaine SOLN Take 5 mLs by mouth 4 (four) times daily. Qty: 100 mL, Refills: 0    oxyCODONE-acetaminophen (PERCOCET/ROXICET) 5-325 MG tablet Take 1-2 tablets by mouth every 4 (four) hours as needed for moderate pain. Qty: 15 tablet, Refills: 0    promethazine (PHENERGAN) 12.5 MG tablet Take 1 tablet (12.5 mg total) by mouth every 4 (four) hours as needed for nausea or vomiting. Qty: 20 tablet, Refills: 0      CONTINUE these medications which have NOT CHANGED   Details  Albuterol (VENTOLIN IN) Inhale into the lungs as needed.    ALPRAZolam (XANAX) 0.5 MG tablet Take 0.5 mg by mouth at bedtime.     ANORO ELLIPTA 62.5-25 MCG/INH AEPB Inhale 1 puff into the lungs daily.     cyanocobalamin (,VITAMIN B-12,) 1000 MCG/ML injection Inject 1 mL (1,000 mcg total) into the muscle every 30 (thirty) days. Qty: 10 mL, Refills: prn   Associated Diagnoses: Vitamin B12 deficiency    dexlansoprazole (DEXILANT) 60 MG capsule Take 1  capsule (60 mg total) by mouth daily. Qty: 90 capsule, Refills: 3    furosemide (LASIX) 40 MG tablet TAKE ONE TABLET BY MOUTH ONCE DAILY. Qty: 90 tablet, Refills: 3    gabapentin (NEURONTIN) 600 MG tablet Take 600 mg by mouth 2 (two) times daily.     ibuprofen (ADVIL,MOTRIN) 800 MG tablet Take 800 mg by mouth every 6 (six) hours as needed. Reported on 08/30/2015    levothyroxine (SYNTHROID, LEVOTHROID) 100 MCG tablet Take 1 tablet (100 mcg total) by mouth daily. Qty: 90 tablet, Refills: 1    metoCLOPramide (REGLAN) 5 MG tablet Take 5 mg by mouth 2 (two) times daily.    oxyCODONE-acetaminophen (PERCOCET) 7.5-325 MG per tablet Take 1 tablet by mouth every 4 (four) hours as needed for pain. Reported on 08/30/2015    potassium chloride (KLOR-CON 10) 10 MEQ tablet Take one tablet daily as directed    riTUXimab (RITUXAN) 100 MG/10ML injection Inject into the vein. Every 4 months    rOPINIRole (REQUIP) 0.5 MG tablet Take 1 mg by mouth at bedtime.     warfarin (COUMADIN) 5 MG tablet Take 2 tablets daily except 1 1/2 tablets on Mondays, Wednesdays and Fridays Qty: 60 tablet, Refills: 6    zolpidem (AMBIEN) 10 MG tablet Take 10 mg by mouth at bedtime as needed for sleep.     temazepam (RESTORIL) 30 MG capsule Take 1 capsule (30 mg total) by mouth at bedtime as needed for sleep. Qty: 30 capsule, Refills: 0  Associated Diagnoses: Insomnia secondary to anxiety         Brief H and P: For complete details please refer to admission H and P, but in brief 46 year old female with SLE, breast cancer status post chemotherapy in 2011, or treatment for otitis, mechanical mitral valve replacement on warfarin and chronic pain presented with 4-5 days of worsening periumbilical and right lower quadrant abdominal pain associated with fevers, chills, nausea and vomiting. She initially presented to an in-hospital with CT of the abdomen and pelvis showed up inside his with rupture and associated 4.5 x 4 x 3.8  cm loculated abscess enveloping the ruptured appendix. Surgery was consulted who recommended I consultation for percutaneous drainage. Patient was transferred to [facility]. IR evaluated the patient and recommended that there was no window for percutaneous drainage of her small abscess. Patient taken to OR for Open appendectomy on 4/4.  Hospital Course:   Sepsis secondary to Appendicitis with abscess - Underwent open appendectomy with drainage of abscess on 4/4.  - Wound VAC applied. Wound culture growing Pseudomonas, pansensitive. Empiric IV Zosyn. (Okay for discontinuing antibiotic for surgery, has received for 7 days already - Patient cleared by surgery to be discharged home today    Nausea and vomiting since 4/8. - Postop period complicated with intractable nausea and vomiting and now oral thrush. No signs of ileus. - Patient started on a soft diet  - cleared by surgery to be discharged home   Oral thrush - Added nystatin Magic mouthwash 4 times a day  Hypokalemia - Secondary to GI loss. Replaced  History of mitral valve replacement with mechanical valve - Seen by cardiology for preop clearance. ResumedCoumadin postop.INR therapeutic - Continue Lasix.  UTI Received empiric antibiotics.  Acute kidney injury -Secondary to sepsis and dehydration. - Resolved with fluids.  Systemic lupus erythematosus -Patient is next due for Rituxan on 06/29/2016 -follows Dr. Berna Bue -intolerant to steroids, eye toxicity on plaquenil   Autoimmune hepatitis  -Appears clinically compensated  Hypothyroidism -Continue Synthroid  Day of Discharge BP 135/89 (BP Location: Right Leg)   Pulse 73   Temp 98.6 F (37 C) (Oral)   Resp 17   Ht 5\' 4"  (1.626 m)   Wt 74.8 kg (165 lb)   SpO2 99%   BMI 28.32 kg/m   Physical Exam: General: Alert and awake oriented x3 not in any acute distress. HEENT: anicteric sclera, pupils reactive to light and  accommodation CVS: S1-S2 clear no murmur rubs or gallops Chest: clear to auscultation bilaterally, no wheezing rales or rhonchi Abdomen: soft minimal tenderness, nondistended, normal bowel sounds, wound vac Extremities: no cyanosis, clubbing or edema noted bilaterally Neuro: Cranial nerves II-XII intact, no focal neurological deficits   The results of significant diagnostics from this hospitalization (including imaging, microbiology, ancillary and laboratory) are listed below for reference.    LAB RESULTS: Basic Metabolic Panel:  Recent Labs Lab 05/24/16 0420  05/29/16 0453 05/30/16 0419  NA 139  < > 140 139  K 3.9  < > 2.9* 3.3*  CL 111  < > 99* 103  CO2 22  < > 30 29  GLUCOSE 94  < > 86 94  BUN 5*  < > <5* <5*  CREATININE 0.63  < > 0.66 0.66  CALCIUM 7.7*  < > 7.9* 8.0*  MG 2.0  --   --   --   < > = values in this interval not displayed. Liver Function Tests:  Recent Labs Lab 05/28/16 1026  AST 56*  ALT 32  ALKPHOS 174*  BILITOT 0.5  PROT 5.2*  ALBUMIN 2.4*   No results for input(s): LIPASE, AMYLASE in the last 168 hours. No results for input(s): AMMONIA in the last 168 hours. CBC:  Recent Labs Lab 05/27/16 0520 05/29/16 0453  WBC 7.8 4.7  HGB 9.9* 9.1*  HCT 30.6* 28.1*  MCV 88.4 88.9  PLT 267 252   Cardiac Enzymes: No results for input(s): CKTOTAL, CKMB, CKMBINDEX, TROPONINI in the last 168 hours. BNP: Invalid input(s): POCBNP CBG: No results for input(s): GLUCAP in the last 168 hours.  Significant Diagnostic Studies:  Dg Chest 2 View  Result Date: 05/21/2016 CLINICAL DATA:  Nausea, vomiting, diarrhea, and abdominal pain for the past 4 days. Former smoker. History of breast malignancy. EXAM: CHEST  2 VIEW COMPARISON:  PA and lateral chest x-ray of September 13, 2015 FINDINGS: The lungs are adequately inflated and clear. The heart and pulmonary vascularity are normal. There is calcification in the wall of the aortic arch. The mediastinum is normal in width.  The bony thorax exhibits no acute abnormality. There postsurgical changes on the left with a breast implant present. IMPRESSION: There is no pneumonia, CHF, nor other acute cardiopulmonary abnormality. Thoracic aortic atherosclerosis. Electronically Signed   By: David  Martinique M.D.   On: 05/21/2016 12:49   Ct Abdomen Pelvis W Contrast  Result Date: 05/21/2016 CLINICAL DATA:  46 year old female with nausea, vomiting, diarrhea and abdominal pain since last week. Systemic lupus. Breast cancer post mastectomy. Mitral valve replacement. Initial encounter. EXAM: CT ABDOMEN AND PELVIS WITH CONTRAST TECHNIQUE: Multidetector CT imaging of the abdomen and pelvis was performed using the standard protocol following bolus administration of intravenous contrast. CONTRAST:  54mL ISOVUE-300 IOPAMIDOL (ISOVUE-300) INJECTION 61% COMPARISON:  02/10/2013 CT abdomen and pelvis. FINDINGS: Lower chest: Minimal basilar atelectasis/ scarring. Post left mastectomy with reconstruction and implant in place. Post mitral valve replacement. Heart size within normal limits. Coronary artery calcifications. Hepatobiliary: Enlarged liver spanning over 19.5 cm. No focal hepatic lesion. Postcholecystectomy. Pancreas: Similar configuration of the pancreas. Ectatic splenic artery with coarse calcifications. Spleen: Assessory splenic tissue. Spleen prominent in the anterior-posterior direction unchanged and within normal limits. Adrenals/Urinary Tract: No renal or ureteral obstructing stone or hydronephrosis. Left upper pole 1.4 cm cyst. Tiny low-density structures in both kidneys may be cysts although too small to characterize. No adrenal lesion. No primary urinary bladder abnormality. Stomach/Bowel: Appendicitis with ruptured appendix (appendicoliths are present). Loculated 4.5 x 4 x 3.8 cm loculated abscess surrounds/ envelops the ruptured appendix. Inflammatory process impresses upon the right fallopian tube/ adnexae and is immediately adjacent to  terminal ileum. Vascular/Lymphatic: Prominent calcified plaque throughout the abdominal aorta, iliac arteries and femoral arteries with subsequent narrowing. No focal aneurysm or large vessel occlusion. Reactive lymph nodes right pelvis. Reproductive: No primary adnexal abnormality. Other: No free air. Musculoskeletal: Degenerative changes L2-3 with disc space narrowing. IMPRESSION: Appendicitis with ruptured appendix (appendicoliths are present). Loculated 4.5 x 4 x 3.8 cm loculated abscess surrounds/ envelops the ruptured appendix. Inflammatory process impresses upon the right fallopian tube/ adnexae and is immediately adjacent to terminal ileum. Prominent calcified plaque throughout the abdominal aorta, iliac arteries and femoral arteries with subsequent narrowing. Splenic artery calcifications with ectasia. Coronary artery calcifications. Enlarged liver spanning over 19.5 cm. Left upper pole 1.4 cm cyst. Tiny low-density structures in both kidneys may be cysts although too small to characterize. These results were called by telephone at the time of interpretation on 05/21/2016 at 3:47 pm  to Dr. Nat Christen , who verbally acknowledged these results. Electronically Signed   By: Genia Del M.D.   On: 05/21/2016 16:09    2D ECHO:   Disposition and Follow-up: Discharge Instructions    Discharge instructions    Complete by:  As directed    SOFT DIET   Increase activity slowly    Complete by:  As directed        DISPOSITION: home    [place], MD Follow up.   Specialty:  General Surgery Why:  Call for follow up appointment in 2 weeks. Contact information: [address] [place] [address] [phone]        Purvis Kilts, MD. Schedule an appointment as soon as possible for a visit in 2 week(s).   Specialty:  Family Medicine Contact information: [address] [place] [address] [phone]             Time spent on Discharge: 4mins   Signed:   Estill Cotta M.D. [facility] 05/30/2016, 3:28 PM Pager: [phone]

## 2016-05-30 NOTE — Progress Notes (Signed)
2015= 1st shift Rn connected pt to her home wound vac. Going home instructions given. Discharged pt in good condition accompanied by family.

## 2016-05-30 NOTE — Progress Notes (Signed)
7 Days Post-Op  Subjective: Still having some nausea, loose liquid stools.  Some emesis but more reflux like backwash, and not overt emesis.  She is taking PO's.  Abd is soft, sore, wound vac in place.    Objective: Vital signs in last 24 hours: Temp:  [97.6 F (36.4 C)-99.5 F (37.5 C)] 97.6 F (36.4 C) (04/11 0524) Pulse Rate:  [74-85] 84 (04/11 0744) Resp:  [16-17] 16 (04/11 0744) BP: (124-142)/(60-77) 142/77 (04/11 0524) SpO2:  [97 %-99 %] 97 % (04/11 0744) Weight:  [74.8 kg (165 lb)] 74.8 kg (165 lb) (04/11 0500) Last BM Date: 05/29/16  Urine 1100 240 PO NO BM recorded Afebrile, VSS,  K+ 3.3 INR 3.5 Intake/Output from previous day: 04/10 0701 - 04/11 0700 In: 240 [P.O.:240] Out: 1100 [Urine:1100] Intake/Output this shift: No intake/output data recorded.  General appearance: alert, cooperative and no distress Resp: clear to auscultation bilaterally GI: soft, sore, open wound with wound vac in place.    Lab Results:   Recent Labs  05/29/16 0453  WBC 4.7  HGB 9.1*  HCT 28.1*  PLT 252    BMET  Recent Labs  05/29/16 0453 05/30/16 0419  NA 140 139  K 2.9* 3.3*  CL 99* 103  CO2 30 29  GLUCOSE 86 94  BUN <5* <5*  CREATININE 0.66 0.66  CALCIUM 7.9* 8.0*   PT/INR  Recent Labs  05/29/16 0453 05/30/16 0419  LABPROT 34.1* 35.9*  INR 3.27 3.50     Recent Labs Lab 05/28/16 1026  AST 56*  ALT 32  ALKPHOS 174*  BILITOT 0.5  PROT 5.2*  ALBUMIN 2.4*     Lipase     Component Value Date/Time   LIPASE 21 05/21/2016 1358     Studies/Results: Dg Abd 2 Views  Result Date: 05/28/2016 CLINICAL DATA:  Recent appendectomy with nausea and vomiting EXAM: ABDOMEN - 2 VIEW COMPARISON:  05/21/2016 FINDINGS: Scattered large and small bowel gas is noted. No obstructive changes are seen. No definitive free air is seen. Mild left basilar atelectasis is noted. No abnormal mass or abnormal calcifications are seen. IMPRESSION: No obstructive pattern is noted. Mild  left basilar atelectasis. Electronically Signed   By: Inez Catalina M.D.   On: 05/28/2016 13:39    Medications: . acetaminophen  650 mg Oral Q6H   Or  . acetaminophen  650 mg Rectal Q6H  . acidophilus  1 capsule Oral Daily  . ALPRAZolam  0.5 mg Oral QHS  . clotrimazole  10 mg Oral 5 X Daily  . furosemide  40 mg Oral Daily  . gabapentin  600 mg Oral BID  . levothyroxine  100 mcg Oral QAC breakfast  . magic mouthwash w/lidocaine  5 mL Oral QID  . mouth rinse  15 mL Mouth Rinse BID  . ondansetron (ZOFRAN) IV  4 mg Intravenous Q8H  . rOPINIRole  1 mg Oral QHS  . sodium chloride  1,000 mL Intravenous Once  . umeclidinium-vilanterol  1 puff Inhalation Daily  . Warfarin - Pharmacist Dosing Inpatient   Does not apply q1800     Assessment/Plan Acute appendicitis with abscess S/P open appendectomy/drainage of abscess, 05/23/16 Dr. 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 Chronic anticoagulation secondary to mitral valve Reynauds Disease Peripheral neuropathy/history of fibromyalgia Hypothyroid FEN: full liquids ID: Zosyn 05/21/16 - 05/26/16 converted to Cipro on 05/27/16 PO/MedicineWound culture:  Bacteria PSEUDOMONAS  AERUGINOSA   Day 9 antibiotics  - discontinued  05/29/16 DVT:  Coumadin  -   INR 3.27   Plan:  Pt notes Dr. Tana Coast wants to keep her another day.  She is off antibiotics, Tylenol, Toradol for pain, she is till taking IV phenergan for nause 75 mg yesterday and 25 this AM so far today     LOS: 9 days    Nichelle Renwick 05/30/2016 [id number]

## 2016-05-30 NOTE — Progress Notes (Signed)
[facility]                                                                              Patient Demographics  [name], is a 46 y.o. female, DOB - 09/17/1970, XNA:[id number]  Admit date - 05/21/2016   Admitting Physician Roney Jaffe, MD  Outpatient Primary MD for the patient is Purvis Kilts, MD  Outpatient specialists:   LOS - 9  days    Chief Complaint  Patient presents with  . Abdominal Pain  . 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 She initially presented to an in-hospital with CT of the abdomen and pelvis showed up inside his with rupture and associated 4.5 x 4 x 3.8 cm loculated abscess enveloping the ruptured appendix. Surgery was consulted who recommended I consultation for percutaneous drainage. Patient was transferred to [facility]. IR evaluated the patient and recommended that there was no window for percutaneous drainage of her small abscess. Patient taken to OR for Open appendectomy on 4/4.   Assessment & Plan    Principal Problem:   Sepsis secondary to Appendicitis with abscess - Underwent open appendectomy with drainage of abscess on 4/4.  - Wound VAC applied. Wound culture growing Pseudomonas, pansensitive. Empiric IV Zosyn. (Okay for discontinuing antibiotic for surgery, has received for 7 days already) - Pain control with Tylenol and when necessary Toradol.   Active Problems:  Nausea and vomiting since 4/8. - Postop period complicated with intractable nausea and vomiting and now oral thrush. No signs of ileus. - Patient started on a soft diet  - Discontinued Zofran, patient states "it does not work", continue  Phenergan and Compazine, added Protonix  Oral thrush - Added nystatin Magic mouthwash 4 times a day  Hypokalemia - Secondary to GI loss. Replaced    History of mitral valve replacement with mechanical valve - Seen by cardiology for preop clearance. Resumed Coumadin postop. INR therapeutic - Continue Lasix.  UTI Received empiric antibiotics.  Acute kidney injury -Secondary to sepsis and dehydration. - Resolved with fluids.  Systemic lupus erythematosus -Patient is next due for Rituxan on 06/29/2016 -follows Dr. Berna Bue -intolerant to steroids, eye toxicity on plaquenil   Autoimmune hepatitis  -Appears clinically compensated  Hypothyroidism -Continue Synthroid  Code Status: full  DVT Prophylaxis:  Warfarin  Family Communication: Discussed in detail with the patient, all imaging results, lab results explained to the patient and family member in the room  Disposition Plan: dc home in am  Time Spent in minutes   25 minutes  Procedures:  CT abdomen and pelvis Open Appendectomy  Consultants:   [facility]  Antimicrobials:      Medications  Scheduled Meds: . acetaminophen  650 mg Oral Q6H   Or  . acetaminophen  650 mg Rectal Q6H  . acidophilus  1 capsule Oral Daily  . ALPRAZolam  0.5 mg Oral QHS  . clotrimazole  10 mg Oral 5 X Daily  . furosemide  40 mg Oral Daily  . gabapentin  600 mg Oral BID  . levothyroxine  100 mcg Oral QAC breakfast  . magic mouthwash w/lidocaine  5 mL Oral QID  . mouth rinse  15 mL Mouth Rinse BID  . ondansetron (ZOFRAN) IV  4 mg Intravenous Q8H  . pantoprazole  40 mg Oral Q0600  . rOPINIRole  1 mg Oral QHS  . sodium chloride  1,000 mL Intravenous Once  . umeclidinium-vilanterol  1 puff Inhalation Daily  . warfarin  3 mg Oral ONCE-1800  . Warfarin - Pharmacist Dosing Inpatient   Does not apply q1800   Continuous Infusions: PRN Meds:.ketorolac, oxyCODONE-acetaminophen, phenol, prochlorperazine,  promethazine, promethazine, sodium chloride flush   Antibiotics   Anti-infectives    Start     Dose/Rate Route Frequency Ordered Stop   05/28/16 1000  piperacillin-tazobactam (ZOSYN) IVPB 3.375 g  Status:  Discontinued     3.375 g 12.5 mL/hr over 240 Minutes Intravenous Every 8 hours 05/28/16 0900 05/29/16 1208   05/27/16 0800  ciprofloxacin (CIPRO) tablet 500 mg  Status:  Discontinued     500 mg Oral 2 times daily 05/27/16 0741 05/28/16 0900   05/23/16 2330  piperacillin-tazobactam (ZOSYN) IVPB 3.375 g  Status:  Discontinued     3.375 g 12.5 mL/hr over 240 Minutes Intravenous Every 8 hours 05/23/16 1742 05/27/16 0741   05/21/16 1600  piperacillin-tazobactam (ZOSYN) IVPB 3.375 g  Status:  Discontinued     3.375 g 12.5 mL/hr over 240 Minutes Intravenous Every 8 hours 05/21/16 1556 05/23/16 1749        Subjective:   [name] was seen and examined today.  Continues to complain of nausea with everything she eats, abdominal pain is improving. She also reports oral thrush which is bothering her throat and upper esophagus. Patient denies dizziness, chest pain, shortness of breath,  new weakness, numbess, tingling.   Objective:   Vitals:   05/30/16 0500 05/30/16 0524 05/30/16 0744 05/30/16 1319  BP:  (!) 142/77  135/89  Pulse:  76 84 73  Resp:  17 16 17   Temp:  97.6 F (36.4 C)  98.6 F (37 C)  TempSrc:  Oral  Oral  SpO2:  97% 97% 99%  Weight: 74.8 kg (165 lb)     Height:        Intake/Output Summary (Last 24 hours) at 05/30/16 1335 Last data filed at 05/30/16 0524  Gross per 24 hour  Intake                0 ml  Output             1000 ml  Net            -1000 ml     Wt Readings from Last 3 Encounters:  05/30/16 74.8 kg (165 lb)  04/18/16 78.9 kg (174 lb)  03/22/16 75.8 kg (167 lb)     Exam  General: Alert and oriented x 3, NAD  HEENT:    Neck: Supple, no JVD  Cardiovascular: S1 S2 auscultated, no rubs, murmurs or gallops. Regular rate and  rhythm.  Respiratory: Clear to auscultation bilaterally, no wheezing, rales or rhonchi  Gastrointestinal: Soft, Midline incision with wound VAC, minimal tenderness, NBS  Ext: no cyanosis clubbing or edema  Neuro: AAOx3, Cr N's II- XII. Strength 5/5 upper and lower extremities bilaterally  Skin: No rashes  Psych: Normal affect and demeanor, alert and oriented x3    Data Reviewed:  I have personally reviewed following labs and imaging studies  Micro Results Recent Results (from the past 240 hour(s))  MRSA PCR Screening     Status: None   Collection Time: 05/23/16 12:57 PM  Result Value Ref Range Status   MRSA by PCR NEGATIVE NEGATIVE Final    Comment:        The GeneXpert MRSA Assay (FDA approved for NASAL specimens only), is one component of a comprehensive MRSA colonization surveillance program. It is not intended to diagnose MRSA infection nor to guide or monitor treatment for MRSA infections.   Fungus Culture With Stain     Status: None (Preliminary result)   Collection Time: 05/23/16  3:56 PM  Result Value Ref Range Status   Fungus Stain Final report  Final    Comment: (NOTE) Performed At: [facility] [place], [place] [id number] Lindon Romp MD FV:[id number]    Fungus (Mycology) Culture PENDING  Incomplete   Fungal Source ABSCESS  Final  Aerobic/Anaerobic Culture (surgical/deep wound)     Status: None   Collection Time: 05/23/16  3:56 PM  Result Value Ref Range Status   Specimen Description ABSCESS  Final   Special Requests NONE  Final   Gram Stain   Final    ABUNDANT WBC PRESENT,BOTH PMN AND MONONUCLEAR FEW GRAM NEGATIVE RODS FEW GRAM POSITIVE RODS RARE GRAM POSITIVE COCCI IN PAIRS    Culture   Final    FEW PSEUDOMONAS AERUGINOSA MIXED ANAEROBIC FLORA PRESENT.  CALL LAB IF FURTHER IID REQUIRED.    Report Status 05/27/2016 FINAL  Final   Organism ID, Bacteria PSEUDOMONAS AERUGINOSA  Final      Susceptibility   Pseudomonas  aeruginosa - MIC*    CEFTAZIDIME 2 SENSITIVE Sensitive     CIPROFLOXACIN <=0.25 SENSITIVE Sensitive     GENTAMICIN <=1 SENSITIVE Sensitive     IMIPENEM <=0.25 SENSITIVE Sensitive     PIP/TAZO 8 SENSITIVE Sensitive     CEFEPIME <=1 SENSITIVE Sensitive     * FEW PSEUDOMONAS AERUGINOSA  Fungus Culture Result     Status: None   Collection Time: 05/23/16  3:56 PM  Result Value Ref Range Status   Result 1 Comment  Final    Comment: (NOTE) KOH/Calcofluor preparation:  no fungus observed. Performed At: [facility] [place], [place] [id number] Lindon Romp MD JT:[id number]   Culture, Urine     Status: None   Collection Time: 05/25/16  4:46 PM  Result Value Ref Range Status   Specimen Description URINE, RANDOM  Final   Special Requests NONE  Final   Culture NO GROWTH  Final   Report Status 05/26/2016 FINAL  Final    Radiology Reports Dg Chest 2 View  Result Date: 05/21/2016 CLINICAL DATA:  Nausea, vomiting, diarrhea, and abdominal pain for the past 4 days. Former smoker. History of breast malignancy. EXAM: CHEST  2 VIEW COMPARISON:  PA and lateral chest x-ray of September 13, 2015 FINDINGS: The lungs are adequately inflated and clear. The heart and pulmonary vascularity are normal. There is calcification in the wall of the aortic arch. The mediastinum is normal in width. The bony thorax exhibits no acute abnormality. There postsurgical changes on the left with a breast implant present. IMPRESSION: There is no pneumonia, CHF, nor other acute cardiopulmonary abnormality. Thoracic aortic atherosclerosis. Electronically Signed   By: David  Martinique M.D.  On: 05/21/2016 12:49   Ct Abdomen Pelvis W Contrast  Result Date: 05/21/2016 CLINICAL DATA:  46 year old female with nausea, vomiting, diarrhea and abdominal pain since last week. Systemic lupus. Breast cancer post mastectomy. Mitral valve replacement. Initial encounter. EXAM: CT ABDOMEN AND PELVIS WITH CONTRAST TECHNIQUE:  Multidetector CT imaging of the abdomen and pelvis was performed using the standard protocol following bolus administration of intravenous contrast. CONTRAST:  21mL ISOVUE-300 IOPAMIDOL (ISOVUE-300) INJECTION 61% COMPARISON:  02/10/2013 CT abdomen and pelvis. FINDINGS: Lower chest: Minimal basilar atelectasis/ scarring. Post left mastectomy with reconstruction and implant in place. Post mitral valve replacement. Heart size within normal limits. Coronary artery calcifications. Hepatobiliary: Enlarged liver spanning over 19.5 cm. No focal hepatic lesion. Postcholecystectomy. Pancreas: Similar configuration of the pancreas. Ectatic splenic artery with coarse calcifications. Spleen: Assessory splenic tissue. Spleen prominent in the anterior-posterior direction unchanged and within normal limits. Adrenals/Urinary Tract: No renal or ureteral obstructing stone or hydronephrosis. Left upper pole 1.4 cm cyst. Tiny low-density structures in both kidneys may be cysts although too small to characterize. No adrenal lesion. No primary urinary bladder abnormality. Stomach/Bowel: Appendicitis with ruptured appendix (appendicoliths are present). Loculated 4.5 x 4 x 3.8 cm loculated abscess surrounds/ envelops the ruptured appendix. Inflammatory process impresses upon the right fallopian tube/ adnexae and is immediately adjacent to terminal ileum. Vascular/Lymphatic: Prominent calcified plaque throughout the abdominal aorta, iliac arteries and femoral arteries with subsequent narrowing. No focal aneurysm or large vessel occlusion. Reactive lymph nodes right pelvis. Reproductive: No primary adnexal abnormality. Other: No free air. Musculoskeletal: Degenerative changes L2-3 with disc space narrowing. IMPRESSION: Appendicitis with ruptured appendix (appendicoliths are present). Loculated 4.5 x 4 x 3.8 cm loculated abscess surrounds/ envelops the ruptured appendix. Inflammatory process impresses upon the right fallopian tube/ adnexae and  is immediately adjacent to terminal ileum. Prominent calcified plaque throughout the abdominal aorta, iliac arteries and femoral arteries with subsequent narrowing. Splenic artery calcifications with ectasia. Coronary artery calcifications. Enlarged liver spanning over 19.5 cm. Left upper pole 1.4 cm cyst. Tiny low-density structures in both kidneys may be cysts although too small to characterize. These results were called by telephone at the time of interpretation on 05/21/2016 at 3:47 pm to Dr. Nat Christen , who verbally acknowledged these results. Electronically Signed   By: Genia Del M.D.   On: 05/21/2016 16:09   Dg Abd 2 Views  Result Date: 05/28/2016 CLINICAL DATA:  Recent appendectomy with nausea and vomiting EXAM: ABDOMEN - 2 VIEW COMPARISON:  05/21/2016 FINDINGS: Scattered large and small bowel gas is noted. No obstructive changes are seen. No definitive free air is seen. Mild left basilar atelectasis is noted. No abnormal mass or abnormal calcifications are seen. IMPRESSION: No obstructive pattern is noted. Mild left basilar atelectasis. Electronically Signed   By: Inez Catalina M.D.   On: 05/28/2016 13:39    Lab Data:  CBC:  Recent Labs Lab 05/24/16 0420 05/25/16 0430 05/27/16 0520 05/29/16 0453  WBC 9.6 7.4 7.8 4.7  HGB 9.0* 8.1* 9.9* 9.1*  HCT 27.7* 25.1* 30.6* 28.1*  MCV 87.9 89.3 88.4 88.9  PLT 203 185 267 932   Basic Metabolic Panel:  Recent Labs Lab 05/24/16 0420 05/25/16 0430 05/28/16 1026 05/29/16 0453 05/30/16 0419  NA 139 140 136 140 139  K 3.9 4.5 3.1* 2.9* 3.3*  CL 111 109 99* 99* 103  CO2 22 21* 30 30 29   GLUCOSE 94 83 95 86 94  BUN 5* <5* <5* <5* <5*  CREATININE 0.63 0.51 0.50  0.66 0.66  CALCIUM 7.7* 7.8* 8.1* 7.9* 8.0*  MG 2.0  --   --   --   --    GFR: Estimated Creatinine Clearance: 86 mL/min (by C-G formula based on SCr of 0.66 mg/dL). Liver Function Tests:  Recent Labs Lab 05/28/16 1026  AST 56*  ALT 32  ALKPHOS 174*  BILITOT 0.5  PROT  5.2*  ALBUMIN 2.4*   No results for input(s): LIPASE, AMYLASE in the last 168 hours. No results for input(s): AMMONIA in the last 168 hours. Coagulation Profile:  Recent Labs Lab 05/26/16 0518 05/27/16 0520 05/28/16 0505 05/29/16 0453 05/30/16 0419  INR 2.18 2.04 2.91 3.27 3.50   Cardiac Enzymes: No results for input(s): CKTOTAL, CKMB, CKMBINDEX, TROPONINI in the last 168 hours. BNP (last 3 results) No results for input(s): PROBNP in the last 8760 hours. HbA1C: No results for input(s): HGBA1C in the last 72 hours. CBG: No results for input(s): GLUCAP in the last 168 hours. Lipid Profile: No results for input(s): CHOL, HDL, LDLCALC, TRIG, CHOLHDL, LDLDIRECT in the last 72 hours. Thyroid Function Tests: No results for input(s): TSH, T4TOTAL, FREET4, T3FREE, THYROIDAB in the last 72 hours. Anemia Panel: No results for input(s): VITAMINB12, FOLATE, FERRITIN, TIBC, IRON, RETICCTPCT in the last 72 hours. Urine analysis:    Component Value Date/Time   COLORURINE YELLOW 05/25/2016 1646   APPEARANCEUR CLEAR 05/25/2016 1646   LABSPEC 1.019 05/25/2016 1646   PHURINE 5.0 05/25/2016 1646   GLUCOSEU NEGATIVE 05/25/2016 1646   HGBUR NEGATIVE 05/25/2016 1646   BILIRUBINUR NEGATIVE 05/25/2016 1646   KETONESUR NEGATIVE 05/25/2016 1646   PROTEINUR NEGATIVE 05/25/2016 1646   UROBILINOGEN 0.2 09/06/2010 1600   NITRITE NEGATIVE 05/25/2016 1646   LEUKOCYTESUR MODERATE (A) 05/25/2016 1646     Leena Tiede M.D. [facility] 05/30/2016, 1:35 PM  Pager: [phone] Between 7am to 7pm - call Pager - [phone]  After 7pm go to [email or web address] - password [email or web address]  Call night coverage person covering after 7pm

## 2016-05-30 NOTE — Progress Notes (Signed)
ANTICOAGULATION CONSULT NOTE - Follow Up Consult  Pharmacy Consult:  Coumadin Indication: mechanical heart valve  Allergies  Allergen Reactions  . 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 kph    Patient Measurements: Height: 5\' 4"  (162.6 cm) Weight: 165 lb (74.8 kg) IBW/kg (Calculated) : 54.7  Vital Signs: Temp: 97.6 F (36.4 C) (04/11 0524) Temp Source: Oral (04/11 0524) BP: 142/77 (04/11 0524) Pulse Rate: 84 (04/11 0744)  Labs:  Recent Labs  05/28/16 0505 05/28/16 1026 05/29/16 0453 05/30/16 0419  HGB  --   --  9.1*  --   HCT  --   --  28.1*  --   PLT  --   --  252  --   LABPROT 31.1*  --  34.1* 35.9*  INR 2.91  --  3.27 3.50  CREATININE  --  0.50 0.66 0.66    Estimated Creatinine Clearance: 86 mL/min (by C-G formula based on SCr of 0.66 mg/dL).   Assessment: 23 YOF with history of [employer] mechanical valve admitted with elevated INR and was given Vitamin K prior to transfer to [facility].  Now off Cipro and INR remains within the therapeutic range.  No bleeding reported.  Home Coumadin dose: 10mg  qday except 7.5mg  MWF   Goal of Therapy:  INR 2.5-3.5 Monitor platelets by anticoagulation protocol: Yes    Plan:  - Coumadin 3mg  PO today to allow INR to drift down - Daily INR, CBC Q48H - Monitor for s/sx bleeding - Watch LFTs with scheduled APAP - Consider scheduling KCL supplementation while on scheduled Lasix    Shawnta Schlegel D. Mina Marble, PharmD, BCPS Pager:  [phone] 05/30/2016, 12:36 PM

## 2016-05-31 DIAGNOSIS — I7300 Raynaud's syndrome without gangrene: Secondary | ICD-10-CM | POA: Diagnosis not present

## 2016-05-31 DIAGNOSIS — M329 Systemic lupus erythematosus, unspecified: Secondary | ICD-10-CM | POA: Diagnosis not present

## 2016-05-31 DIAGNOSIS — E538 Deficiency of other specified B group vitamins: Secondary | ICD-10-CM | POA: Diagnosis not present

## 2016-05-31 DIAGNOSIS — F419 Anxiety disorder, unspecified: Secondary | ICD-10-CM | POA: Diagnosis not present

## 2016-05-31 DIAGNOSIS — D591 Other autoimmune hemolytic anemias: Secondary | ICD-10-CM | POA: Diagnosis not present

## 2016-05-31 DIAGNOSIS — Z5181 Encounter for therapeutic drug level monitoring: Secondary | ICD-10-CM | POA: Diagnosis not present

## 2016-05-31 DIAGNOSIS — K716 Toxic liver disease with hepatitis, not elsewhere classified: Secondary | ICD-10-CM | POA: Diagnosis not present

## 2016-05-31 DIAGNOSIS — Z7901 Long term (current) use of anticoagulants: Secondary | ICD-10-CM | POA: Diagnosis not present

## 2016-05-31 DIAGNOSIS — Z48815 Encounter for surgical aftercare following surgery on the digestive system: Secondary | ICD-10-CM | POA: Diagnosis not present

## 2016-05-31 DIAGNOSIS — I059 Rheumatic mitral valve disease, unspecified: Secondary | ICD-10-CM | POA: Diagnosis not present

## 2016-05-31 DIAGNOSIS — Z853 Personal history of malignant neoplasm of breast: Secondary | ICD-10-CM | POA: Diagnosis not present

## 2016-05-31 DIAGNOSIS — F17210 Nicotine dependence, cigarettes, uncomplicated: Secondary | ICD-10-CM | POA: Diagnosis not present

## 2016-05-31 DIAGNOSIS — E1140 Type 2 diabetes mellitus with diabetic neuropathy, unspecified: Secondary | ICD-10-CM | POA: Diagnosis not present

## 2016-06-01 DIAGNOSIS — Z1389 Encounter for screening for other disorder: Secondary | ICD-10-CM | POA: Diagnosis not present

## 2016-06-01 DIAGNOSIS — E876 Hypokalemia: Secondary | ICD-10-CM | POA: Diagnosis not present

## 2016-06-01 DIAGNOSIS — E663 Overweight: Secondary | ICD-10-CM | POA: Diagnosis not present

## 2016-06-01 DIAGNOSIS — Z6828 Body mass index (BMI) 28.0-28.9, adult: Secondary | ICD-10-CM | POA: Diagnosis not present

## 2016-06-01 DIAGNOSIS — K353 Acute appendicitis with localized peritonitis: Secondary | ICD-10-CM | POA: Diagnosis not present

## 2016-06-01 DIAGNOSIS — E538 Deficiency of other specified B group vitamins: Secondary | ICD-10-CM | POA: Diagnosis not present

## 2016-06-02 DIAGNOSIS — Z7901 Long term (current) use of anticoagulants: Secondary | ICD-10-CM | POA: Diagnosis not present

## 2016-06-02 DIAGNOSIS — I7300 Raynaud's syndrome without gangrene: Secondary | ICD-10-CM | POA: Diagnosis not present

## 2016-06-02 DIAGNOSIS — D591 Other autoimmune hemolytic anemias: Secondary | ICD-10-CM | POA: Diagnosis not present

## 2016-06-02 DIAGNOSIS — Z5181 Encounter for therapeutic drug level monitoring: Secondary | ICD-10-CM | POA: Diagnosis not present

## 2016-06-02 DIAGNOSIS — Z48815 Encounter for surgical aftercare following surgery on the digestive system: Secondary | ICD-10-CM | POA: Diagnosis not present

## 2016-06-02 DIAGNOSIS — I059 Rheumatic mitral valve disease, unspecified: Secondary | ICD-10-CM | POA: Diagnosis not present

## 2016-06-02 DIAGNOSIS — E1140 Type 2 diabetes mellitus with diabetic neuropathy, unspecified: Secondary | ICD-10-CM | POA: Diagnosis not present

## 2016-06-02 DIAGNOSIS — M329 Systemic lupus erythematosus, unspecified: Secondary | ICD-10-CM | POA: Diagnosis not present

## 2016-06-02 DIAGNOSIS — E538 Deficiency of other specified B group vitamins: Secondary | ICD-10-CM | POA: Diagnosis not present

## 2016-06-02 DIAGNOSIS — F419 Anxiety disorder, unspecified: Secondary | ICD-10-CM | POA: Diagnosis not present

## 2016-06-02 DIAGNOSIS — Z853 Personal history of malignant neoplasm of breast: Secondary | ICD-10-CM | POA: Diagnosis not present

## 2016-06-02 DIAGNOSIS — K716 Toxic liver disease with hepatitis, not elsewhere classified: Secondary | ICD-10-CM | POA: Diagnosis not present

## 2016-06-02 DIAGNOSIS — F17210 Nicotine dependence, cigarettes, uncomplicated: Secondary | ICD-10-CM | POA: Diagnosis not present

## 2016-06-04 ENCOUNTER — Ambulatory Visit (INDEPENDENT_AMBULATORY_CARE_PROVIDER_SITE_OTHER): Payer: BLUE CROSS/BLUE SHIELD | Admitting: *Deleted

## 2016-06-04 DIAGNOSIS — I7300 Raynaud's syndrome without gangrene: Secondary | ICD-10-CM | POA: Diagnosis not present

## 2016-06-04 DIAGNOSIS — I059 Rheumatic mitral valve disease, unspecified: Secondary | ICD-10-CM | POA: Diagnosis not present

## 2016-06-04 DIAGNOSIS — M329 Systemic lupus erythematosus, unspecified: Secondary | ICD-10-CM | POA: Diagnosis not present

## 2016-06-04 DIAGNOSIS — E538 Deficiency of other specified B group vitamins: Secondary | ICD-10-CM | POA: Diagnosis not present

## 2016-06-04 DIAGNOSIS — Z952 Presence of prosthetic heart valve: Secondary | ICD-10-CM

## 2016-06-04 DIAGNOSIS — Z853 Personal history of malignant neoplasm of breast: Secondary | ICD-10-CM | POA: Diagnosis not present

## 2016-06-04 DIAGNOSIS — E1140 Type 2 diabetes mellitus with diabetic neuropathy, unspecified: Secondary | ICD-10-CM | POA: Diagnosis not present

## 2016-06-04 DIAGNOSIS — Z5181 Encounter for therapeutic drug level monitoring: Secondary | ICD-10-CM

## 2016-06-04 DIAGNOSIS — Z48815 Encounter for surgical aftercare following surgery on the digestive system: Secondary | ICD-10-CM | POA: Diagnosis not present

## 2016-06-04 DIAGNOSIS — K716 Toxic liver disease with hepatitis, not elsewhere classified: Secondary | ICD-10-CM | POA: Diagnosis not present

## 2016-06-04 DIAGNOSIS — Z7901 Long term (current) use of anticoagulants: Secondary | ICD-10-CM | POA: Diagnosis not present

## 2016-06-04 DIAGNOSIS — D591 Other autoimmune hemolytic anemias: Secondary | ICD-10-CM | POA: Diagnosis not present

## 2016-06-04 DIAGNOSIS — F419 Anxiety disorder, unspecified: Secondary | ICD-10-CM | POA: Diagnosis not present

## 2016-06-04 DIAGNOSIS — F17210 Nicotine dependence, cigarettes, uncomplicated: Secondary | ICD-10-CM | POA: Diagnosis not present

## 2016-06-04 LAB — POCT INR: INR: 3.6

## 2016-06-06 DIAGNOSIS — Z48815 Encounter for surgical aftercare following surgery on the digestive system: Secondary | ICD-10-CM | POA: Diagnosis not present

## 2016-06-06 DIAGNOSIS — D591 Other autoimmune hemolytic anemias: Secondary | ICD-10-CM | POA: Diagnosis not present

## 2016-06-06 DIAGNOSIS — Z5181 Encounter for therapeutic drug level monitoring: Secondary | ICD-10-CM | POA: Diagnosis not present

## 2016-06-06 DIAGNOSIS — F17210 Nicotine dependence, cigarettes, uncomplicated: Secondary | ICD-10-CM | POA: Diagnosis not present

## 2016-06-06 DIAGNOSIS — E538 Deficiency of other specified B group vitamins: Secondary | ICD-10-CM | POA: Diagnosis not present

## 2016-06-06 DIAGNOSIS — I7300 Raynaud's syndrome without gangrene: Secondary | ICD-10-CM | POA: Diagnosis not present

## 2016-06-06 DIAGNOSIS — Z853 Personal history of malignant neoplasm of breast: Secondary | ICD-10-CM | POA: Diagnosis not present

## 2016-06-06 DIAGNOSIS — K716 Toxic liver disease with hepatitis, not elsewhere classified: Secondary | ICD-10-CM | POA: Diagnosis not present

## 2016-06-06 DIAGNOSIS — E1140 Type 2 diabetes mellitus with diabetic neuropathy, unspecified: Secondary | ICD-10-CM | POA: Diagnosis not present

## 2016-06-06 DIAGNOSIS — Z79899 Other long term (current) drug therapy: Secondary | ICD-10-CM | POA: Diagnosis not present

## 2016-06-06 DIAGNOSIS — M2550 Pain in unspecified joint: Secondary | ICD-10-CM | POA: Diagnosis not present

## 2016-06-06 DIAGNOSIS — M329 Systemic lupus erythematosus, unspecified: Secondary | ICD-10-CM | POA: Diagnosis not present

## 2016-06-06 DIAGNOSIS — F419 Anxiety disorder, unspecified: Secondary | ICD-10-CM | POA: Diagnosis not present

## 2016-06-06 DIAGNOSIS — M0609 Rheumatoid arthritis without rheumatoid factor, multiple sites: Secondary | ICD-10-CM | POA: Diagnosis not present

## 2016-06-06 DIAGNOSIS — Z7901 Long term (current) use of anticoagulants: Secondary | ICD-10-CM | POA: Diagnosis not present

## 2016-06-06 DIAGNOSIS — I059 Rheumatic mitral valve disease, unspecified: Secondary | ICD-10-CM | POA: Diagnosis not present

## 2016-06-08 DIAGNOSIS — Z48815 Encounter for surgical aftercare following surgery on the digestive system: Secondary | ICD-10-CM | POA: Diagnosis not present

## 2016-06-08 DIAGNOSIS — F17210 Nicotine dependence, cigarettes, uncomplicated: Secondary | ICD-10-CM | POA: Diagnosis not present

## 2016-06-08 DIAGNOSIS — Z5181 Encounter for therapeutic drug level monitoring: Secondary | ICD-10-CM | POA: Diagnosis not present

## 2016-06-08 DIAGNOSIS — I059 Rheumatic mitral valve disease, unspecified: Secondary | ICD-10-CM | POA: Diagnosis not present

## 2016-06-08 DIAGNOSIS — K716 Toxic liver disease with hepatitis, not elsewhere classified: Secondary | ICD-10-CM | POA: Diagnosis not present

## 2016-06-08 DIAGNOSIS — Z853 Personal history of malignant neoplasm of breast: Secondary | ICD-10-CM | POA: Diagnosis not present

## 2016-06-08 DIAGNOSIS — M329 Systemic lupus erythematosus, unspecified: Secondary | ICD-10-CM | POA: Diagnosis not present

## 2016-06-08 DIAGNOSIS — D591 Other autoimmune hemolytic anemias: Secondary | ICD-10-CM | POA: Diagnosis not present

## 2016-06-08 DIAGNOSIS — E1140 Type 2 diabetes mellitus with diabetic neuropathy, unspecified: Secondary | ICD-10-CM | POA: Diagnosis not present

## 2016-06-08 DIAGNOSIS — E538 Deficiency of other specified B group vitamins: Secondary | ICD-10-CM | POA: Diagnosis not present

## 2016-06-08 DIAGNOSIS — I7300 Raynaud's syndrome without gangrene: Secondary | ICD-10-CM | POA: Diagnosis not present

## 2016-06-08 DIAGNOSIS — Z7901 Long term (current) use of anticoagulants: Secondary | ICD-10-CM | POA: Diagnosis not present

## 2016-06-08 DIAGNOSIS — F419 Anxiety disorder, unspecified: Secondary | ICD-10-CM | POA: Diagnosis not present

## 2016-06-11 DIAGNOSIS — E1140 Type 2 diabetes mellitus with diabetic neuropathy, unspecified: Secondary | ICD-10-CM | POA: Diagnosis not present

## 2016-06-11 DIAGNOSIS — E538 Deficiency of other specified B group vitamins: Secondary | ICD-10-CM | POA: Diagnosis not present

## 2016-06-11 DIAGNOSIS — D591 Other autoimmune hemolytic anemias: Secondary | ICD-10-CM | POA: Diagnosis not present

## 2016-06-11 DIAGNOSIS — I059 Rheumatic mitral valve disease, unspecified: Secondary | ICD-10-CM | POA: Diagnosis not present

## 2016-06-11 DIAGNOSIS — M329 Systemic lupus erythematosus, unspecified: Secondary | ICD-10-CM | POA: Diagnosis not present

## 2016-06-11 DIAGNOSIS — Z853 Personal history of malignant neoplasm of breast: Secondary | ICD-10-CM | POA: Diagnosis not present

## 2016-06-11 DIAGNOSIS — Z5181 Encounter for therapeutic drug level monitoring: Secondary | ICD-10-CM | POA: Diagnosis not present

## 2016-06-11 DIAGNOSIS — F419 Anxiety disorder, unspecified: Secondary | ICD-10-CM | POA: Diagnosis not present

## 2016-06-11 DIAGNOSIS — I7300 Raynaud's syndrome without gangrene: Secondary | ICD-10-CM | POA: Diagnosis not present

## 2016-06-11 DIAGNOSIS — K716 Toxic liver disease with hepatitis, not elsewhere classified: Secondary | ICD-10-CM | POA: Diagnosis not present

## 2016-06-11 DIAGNOSIS — F17210 Nicotine dependence, cigarettes, uncomplicated: Secondary | ICD-10-CM | POA: Diagnosis not present

## 2016-06-11 DIAGNOSIS — Z48815 Encounter for surgical aftercare following surgery on the digestive system: Secondary | ICD-10-CM | POA: Diagnosis not present

## 2016-06-11 DIAGNOSIS — Z7901 Long term (current) use of anticoagulants: Secondary | ICD-10-CM | POA: Diagnosis not present

## 2016-06-13 ENCOUNTER — Ambulatory Visit (INDEPENDENT_AMBULATORY_CARE_PROVIDER_SITE_OTHER): Payer: BLUE CROSS/BLUE SHIELD | Admitting: *Deleted

## 2016-06-13 DIAGNOSIS — F419 Anxiety disorder, unspecified: Secondary | ICD-10-CM | POA: Diagnosis not present

## 2016-06-13 DIAGNOSIS — Z5181 Encounter for therapeutic drug level monitoring: Secondary | ICD-10-CM

## 2016-06-13 DIAGNOSIS — E538 Deficiency of other specified B group vitamins: Secondary | ICD-10-CM | POA: Diagnosis not present

## 2016-06-13 DIAGNOSIS — Z952 Presence of prosthetic heart valve: Secondary | ICD-10-CM | POA: Diagnosis not present

## 2016-06-13 DIAGNOSIS — Z48815 Encounter for surgical aftercare following surgery on the digestive system: Secondary | ICD-10-CM | POA: Diagnosis not present

## 2016-06-13 DIAGNOSIS — Z7901 Long term (current) use of anticoagulants: Secondary | ICD-10-CM | POA: Diagnosis not present

## 2016-06-13 DIAGNOSIS — Z853 Personal history of malignant neoplasm of breast: Secondary | ICD-10-CM | POA: Diagnosis not present

## 2016-06-13 DIAGNOSIS — I059 Rheumatic mitral valve disease, unspecified: Secondary | ICD-10-CM | POA: Diagnosis not present

## 2016-06-13 DIAGNOSIS — E1140 Type 2 diabetes mellitus with diabetic neuropathy, unspecified: Secondary | ICD-10-CM | POA: Diagnosis not present

## 2016-06-13 DIAGNOSIS — K716 Toxic liver disease with hepatitis, not elsewhere classified: Secondary | ICD-10-CM | POA: Diagnosis not present

## 2016-06-13 DIAGNOSIS — F17210 Nicotine dependence, cigarettes, uncomplicated: Secondary | ICD-10-CM | POA: Diagnosis not present

## 2016-06-13 DIAGNOSIS — M329 Systemic lupus erythematosus, unspecified: Secondary | ICD-10-CM | POA: Diagnosis not present

## 2016-06-13 DIAGNOSIS — D591 Other autoimmune hemolytic anemias: Secondary | ICD-10-CM | POA: Diagnosis not present

## 2016-06-13 DIAGNOSIS — I7300 Raynaud's syndrome without gangrene: Secondary | ICD-10-CM | POA: Diagnosis not present

## 2016-06-13 LAB — POCT INR: INR: 5.4

## 2016-06-13 MED ORDER — WARFARIN SODIUM 5 MG PO TABS: ORAL_TABLET | ORAL | 6 refills | Status: DC

## 2016-06-20 ENCOUNTER — Other Ambulatory Visit: Payer: Self-pay | Admitting: "Endocrinology

## 2016-06-20 ENCOUNTER — Ambulatory Visit (INDEPENDENT_AMBULATORY_CARE_PROVIDER_SITE_OTHER): Payer: BLUE CROSS/BLUE SHIELD | Admitting: *Deleted

## 2016-06-20 DIAGNOSIS — E039 Hypothyroidism, unspecified: Secondary | ICD-10-CM | POA: Diagnosis not present

## 2016-06-20 DIAGNOSIS — F17210 Nicotine dependence, cigarettes, uncomplicated: Secondary | ICD-10-CM | POA: Diagnosis not present

## 2016-06-20 DIAGNOSIS — E538 Deficiency of other specified B group vitamins: Secondary | ICD-10-CM | POA: Diagnosis not present

## 2016-06-20 DIAGNOSIS — Z5181 Encounter for therapeutic drug level monitoring: Secondary | ICD-10-CM

## 2016-06-20 DIAGNOSIS — I059 Rheumatic mitral valve disease, unspecified: Secondary | ICD-10-CM | POA: Diagnosis not present

## 2016-06-20 DIAGNOSIS — Z48815 Encounter for surgical aftercare following surgery on the digestive system: Secondary | ICD-10-CM | POA: Diagnosis not present

## 2016-06-20 DIAGNOSIS — E1140 Type 2 diabetes mellitus with diabetic neuropathy, unspecified: Secondary | ICD-10-CM | POA: Diagnosis not present

## 2016-06-20 DIAGNOSIS — Z952 Presence of prosthetic heart valve: Secondary | ICD-10-CM | POA: Diagnosis not present

## 2016-06-20 DIAGNOSIS — Z7901 Long term (current) use of anticoagulants: Secondary | ICD-10-CM | POA: Diagnosis not present

## 2016-06-20 DIAGNOSIS — D591 Other autoimmune hemolytic anemias: Secondary | ICD-10-CM | POA: Diagnosis not present

## 2016-06-20 DIAGNOSIS — I7300 Raynaud's syndrome without gangrene: Secondary | ICD-10-CM | POA: Diagnosis not present

## 2016-06-20 DIAGNOSIS — Z853 Personal history of malignant neoplasm of breast: Secondary | ICD-10-CM | POA: Diagnosis not present

## 2016-06-20 DIAGNOSIS — F419 Anxiety disorder, unspecified: Secondary | ICD-10-CM | POA: Diagnosis not present

## 2016-06-20 DIAGNOSIS — M329 Systemic lupus erythematosus, unspecified: Secondary | ICD-10-CM | POA: Diagnosis not present

## 2016-06-20 DIAGNOSIS — K716 Toxic liver disease with hepatitis, not elsewhere classified: Secondary | ICD-10-CM | POA: Diagnosis not present

## 2016-06-20 LAB — T4, FREE: FREE T4: 1.6 ng/dL (ref 0.8–1.8)

## 2016-06-20 LAB — TSH: TSH: 0.68 m[IU]/L

## 2016-06-20 LAB — POCT INR: INR: 1.8

## 2016-06-21 DIAGNOSIS — D511 Vitamin B12 deficiency anemia due to selective vitamin B12 malabsorption with proteinuria: Secondary | ICD-10-CM | POA: Diagnosis not present

## 2016-06-21 DIAGNOSIS — Z1389 Encounter for screening for other disorder: Secondary | ICD-10-CM | POA: Diagnosis not present

## 2016-06-21 DIAGNOSIS — G894 Chronic pain syndrome: Secondary | ICD-10-CM | POA: Diagnosis not present

## 2016-06-21 DIAGNOSIS — J302 Other seasonal allergic rhinitis: Secondary | ICD-10-CM | POA: Diagnosis not present

## 2016-06-21 DIAGNOSIS — Z6827 Body mass index (BMI) 27.0-27.9, adult: Secondary | ICD-10-CM | POA: Diagnosis not present

## 2016-06-21 DIAGNOSIS — K352 Acute appendicitis with generalized peritonitis: Secondary | ICD-10-CM | POA: Diagnosis not present

## 2016-06-21 LAB — FUNGUS CULTURE WITH STAIN

## 2016-06-21 LAB — FUNGUS CULTURE RESULT

## 2016-06-21 LAB — FUNGAL ORGANISM REFLEX

## 2016-06-27 ENCOUNTER — Encounter: Payer: Self-pay | Admitting: "Endocrinology

## 2016-06-27 ENCOUNTER — Ambulatory Visit (INDEPENDENT_AMBULATORY_CARE_PROVIDER_SITE_OTHER): Payer: BLUE CROSS/BLUE SHIELD | Admitting: "Endocrinology

## 2016-06-27 ENCOUNTER — Ambulatory Visit (INDEPENDENT_AMBULATORY_CARE_PROVIDER_SITE_OTHER): Payer: BLUE CROSS/BLUE SHIELD | Admitting: *Deleted

## 2016-06-27 VITALS — BP 114/77 | HR 86 | Ht 64.0 in | Wt 161.0 lb

## 2016-06-27 DIAGNOSIS — Z952 Presence of prosthetic heart valve: Secondary | ICD-10-CM | POA: Diagnosis not present

## 2016-06-27 DIAGNOSIS — Z5181 Encounter for therapeutic drug level monitoring: Secondary | ICD-10-CM

## 2016-06-27 DIAGNOSIS — E039 Hypothyroidism, unspecified: Secondary | ICD-10-CM | POA: Diagnosis not present

## 2016-06-27 LAB — POCT INR: INR: 3

## 2016-06-27 MED ORDER — LEVOTHYROXINE SODIUM 100 MCG PO TABS: 100.0000 ug | ORAL_TABLET | Freq: Every day | ORAL | 4 refills | Status: DC

## 2016-06-27 NOTE — Progress Notes (Signed)
HPI  [name] [name] is a 46 y.o.-year-old female, here to f/u for hypothyroidism. She is on LT4 100 mcg po qam. She is compliant and taking her LT4 properly. She  has no new complaints. - in April 2018, she underwent laparotomy for ruptured appendicitis. - She lost 10 more pounds of body weight. She is still recovering from her surgery. She denies heat intolerance.   ROS: Constitutional: +weight loss, no fatigue, no subjective hyperthermia/hypothermia Eyes: no blurry vision, no xerophthalmia ENT: no sore throat, no nodules palpated in throat, no dysphagia/odynophagia, no hoarseness Cardiovascular: no CP/SOB/palpitations/leg swelling Respiratory: no cough/SOB Gastrointestinal: no N/V/D/C Musculoskeletal: no muscle/joint aches Skin: no rashes Neurological: no tremors/numbness/tingling/dizziness Psychiatric: no depression/anxiety  PE: BP 114/77   Pulse 86   Ht 5\' 4"  (1.626 m)   Wt 161 lb (73 kg)   BMI 27.64 kg/m  Wt Readings from Last 3 Encounters:  06/27/16 161 lb (73 kg)  05/30/16 165 lb (74.8 kg)  04/18/16 174 lb (78.9 kg)   Constitutional:  in NAD Eyes: PERRLA, EOMI, no exophthalmos ENT: moist mucous membranes, no thyromegaly, no cervical lymphadenopathy Cardiovascular: RRR, No MRG Respiratory: CTA B Gastrointestinal: abdomen soft, NT, ND, BS+ Musculoskeletal: no deformities, strength intact in all 4 Skin: moist, warm, no rashes Neurological: no tremor with outstretched hands, DTR normal in all 4   Results for [name], [name] (MRN [id number]) as of 06/27/2016 14:19  Ref. Range 06/20/2016 11:55  TSH Latest Units: mIU/L 0.68  T4,Free(Direct) Latest Ref Range: 0.8 - 1.8 ng/dL 1.6     ASSESSMENT: 1. Hypothyroidism  PLAN:    -Patient with long-standing hypothyroidism, on levothyroxine therapy.  -  Despite her recent significant weight loss, thyroid function tests are consistent with appropriate replacement. I advised her to continue  Levothyroxine 100 g by mouth  every morning. - We discussed about correct intake of levothyroxine, at fasting, with water, separated by at least 30 minutes from breakfast, and separated by more than 4 hours from calcium, iron, multivitamins, acid reflux medications (PPIs). -Patient is made aware of the fact that thyroid hormone replacement is needed for life, dose to be adjusted by periodic monitoring of thyroid function tests. - Will check thyroid tests before next visit: TSH, free T4   Glade Lloyd, MD Phone: [phone]  Fax: [phone]   06/27/2016, 2:25 PM

## 2016-07-11 ENCOUNTER — Ambulatory Visit (INDEPENDENT_AMBULATORY_CARE_PROVIDER_SITE_OTHER): Payer: BLUE CROSS/BLUE SHIELD | Admitting: *Deleted

## 2016-07-11 DIAGNOSIS — Z952 Presence of prosthetic heart valve: Secondary | ICD-10-CM | POA: Diagnosis not present

## 2016-07-11 DIAGNOSIS — Z5181 Encounter for therapeutic drug level monitoring: Secondary | ICD-10-CM | POA: Diagnosis not present

## 2016-07-11 LAB — POCT INR: INR: 4

## 2016-07-13 ENCOUNTER — Other Ambulatory Visit: Payer: Self-pay | Admitting: Gastroenterology

## 2016-07-13 DIAGNOSIS — L259 Unspecified contact dermatitis, unspecified cause: Secondary | ICD-10-CM | POA: Diagnosis not present

## 2016-07-13 DIAGNOSIS — Z1389 Encounter for screening for other disorder: Secondary | ICD-10-CM | POA: Diagnosis not present

## 2016-07-13 DIAGNOSIS — Z6827 Body mass index (BMI) 27.0-27.9, adult: Secondary | ICD-10-CM | POA: Diagnosis not present

## 2016-07-13 DIAGNOSIS — F419 Anxiety disorder, unspecified: Secondary | ICD-10-CM | POA: Diagnosis not present

## 2016-07-13 DIAGNOSIS — G894 Chronic pain syndrome: Secondary | ICD-10-CM | POA: Diagnosis not present

## 2016-07-17 ENCOUNTER — Ambulatory Visit (HOSPITAL_COMMUNITY)
Admission: RE | Admit: 2016-07-17 | Discharge: 2016-07-17 | Disposition: A | Discharge status: Home / Self Care | Payer: BLUE CROSS/BLUE SHIELD | Source: Ambulatory Visit | Attending: Adult Health | Admitting: Adult Health

## 2016-07-17 DIAGNOSIS — I509 Heart failure, unspecified: Secondary | ICD-10-CM

## 2016-07-17 DIAGNOSIS — I515 Myocardial degeneration: Secondary | ICD-10-CM | POA: Insufficient documentation

## 2016-07-17 DIAGNOSIS — Z952 Presence of prosthetic heart valve: Secondary | ICD-10-CM | POA: Insufficient documentation

## 2016-07-17 DIAGNOSIS — I358 Other nonrheumatic aortic valve disorders: Secondary | ICD-10-CM | POA: Diagnosis not present

## 2016-07-17 DIAGNOSIS — Z7901 Long term (current) use of anticoagulants: Secondary | ICD-10-CM | POA: Diagnosis not present

## 2016-07-17 NOTE — Progress Notes (Signed)
*  PRELIMINARY RESULTS* Echocardiogram 2D Echocardiogram has been performed.  [name] 07/17/2016, 12:19 PM

## 2016-07-19 NOTE — Progress Notes (Signed)
Cardiology Office Note  Date: 07/20/2016   ID: [name], DOB 31-May-1970, MRN [id number]  PCP: Sharilyn Sites, MD  Primary Cardiologist: Rozann Lesches, MD   Chief Complaint  Patient presents with  . History of mitral valve replacement    History of Present Illness: [name] is a 46 y.o. female last seen in November 2017. Record review finds recent hospitalization in April with ruptured appendix requiring surgery. She was off Coumadin temporarily. Follow-up echocardiogram is noted below. She presents today for a follow-up visit, states that she is back to baseline. She does not report any chest pain or breathlessness. Has an occasional feeling of very brief palpitations, no lightheadedness or syncope however.  She continues to follow in the anticoagulation clinic on Coumadin. She reports no bleeding problems.  Past Medical History:  Diagnosis Date  . Abnormal Papanicolaou smear of cervix with positive human papilloma virus (HPV) test 08/17/2015  . Anticoagulation goal of INR 2.5 to 3.5   . Anxiety   . B12 deficiency   . BRCA1 negative   . BRCA2 negative   . Breast cancer ([place]) 2011   Left side  . Bursitis of right knee    Septic bursitis  . Drug-induced hepatitis    States per her rheumatologist, transaminases elevated but normalized after drug removed for   . Fibromyalgia   . 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 History of abnormal cervical Pap smear 08/12/2015  . Mitral valve disease, rheumatic    St. Jude prosthesis  . Peripheral neuropathy   . Pneumonia   . Raynaud disease   . Raynaud disease   . SLE (systemic lupus erythematosus) ([place])     Past Surgical History:  Procedure Laterality Date  . APPENDECTOMY N/A 05/23/2016   Procedure: OPEN APPENDECTOMY WITH DRAINAGE OF ABSCESS;  Surgeon: Fanny Skates, MD;  Location: [facility];  Service: General;  Laterality: N/A;  . BREAST SURGERY    . CHOLECYSTECTOMY    . ENDOMETRIAL  ABLATION  2008   She no longer has menses  . ESOPHAGOGASTRODUODENOSCOPY N/A 03/25/2013   Dr. Raliegh Scarlet reflux esophagitis-likely source of patient's symptoms (patulous EG junction). Hiatal hernia otherwise normal  . Insertion expander left breast  11/16/10  . MASTECTOMY    . MASTOPEXY  02/14/2011   Procedure: MASTOPEXY;  Surgeon: Macon Large;  Location: [facility];  Service: Plastics;  Laterality: Bilateral;  Right Breast Reduction   . MITRAL VALVE REPLACEMENT    . TISSUE EXPANDER PLACEMENT  02/14/2011   Procedure: TISSUE EXPANDER;  Surgeon: Macon Large;  Location: [facility];  Service: Plastics;  Laterality: Bilateral;  Left Breast Remove Tissue Expander Placement of Implant Breast Reconstruction  . TUBAL LIGATION      Current Outpatient Prescriptions  Medication Sig Dispense Refill  . Albuterol (VENTOLIN IN) Inhale into the lungs as needed.    . ALPRAZolam (XANAX) 0.5 MG tablet Take 0.5 mg by mouth at bedtime.     Jearl Klinefelter ELLIPTA 62.5-25 MCG/INH AEPB Inhale 1 puff into the lungs daily.     . cyanocobalamin (,VITAMIN B-12,) 1000 MCG/ML injection Inject 1 mL (1,000 mcg total) into the muscle every 30 (thirty) days. 10 mL prn  . DEXILANT 60 MG capsule TAKE ONE CAPSULE BY MOUTH DAILY. 90 capsule 3  . furosemide (LASIX) 40 MG tablet TAKE ONE TABLET BY MOUTH ONCE DAILY. 90 tablet 3  . gabapentin (NEURONTIN) 600 MG tablet Take 600 mg by mouth 2 (two)  times daily.     Marland Kitchen ibuprofen (ADVIL,MOTRIN) 800 MG tablet Take 800 mg by mouth every 6 (six) hours as needed. Reported on 08/30/2015    . levothyroxine (SYNTHROID, LEVOTHROID) 100 MCG tablet Take 1 tablet (100 mcg total) by mouth daily. 90 tablet 4  . magic mouthwash w/lidocaine SOLN Take 5 mLs by mouth 4 (four) times daily. 100 mL 0  . metoCLOPramide (REGLAN) 5 MG tablet Take 5 mg by mouth 2 (two) times daily.    Marland Kitchen oxyCODONE-acetaminophen (PERCOCET/ROXICET) 5-325 MG tablet Take 1-2 tablets by mouth every 4 (four) hours as needed.     . potassium  chloride (KLOR-CON 10) 10 MEQ tablet Take one tablet daily as directed    . promethazine (PHENERGAN) 12.5 MG tablet Take 1 tablet (12.5 mg total) by mouth every 4 (four) hours as needed for nausea or vomiting. 20 tablet 0  . riTUXimab (RITUXAN) 100 MG/10ML injection Inject into the vein. Every 4 months    . rOPINIRole (REQUIP) 0.5 MG tablet Take 1 mg by mouth at bedtime.     Marland Kitchen warfarin (COUMADIN) 5 MG tablet Take 1 1/2 tablets daily except 2 tablets on Tuesdays 60 tablet 6  . zolpidem (AMBIEN) 10 MG tablet Take 10 mg by mouth at bedtime as needed for sleep.      No current facility-administered medications for this visit.    Allergies:  Other   Social History: The patient  reports that she quit smoking about 10 years ago. Her smoking use included Cigarettes. She has a 15.00 pack-year smoking history. She has never used smokeless tobacco. She reports that she drinks alcohol. She reports that she does not use drugs.   ROS:  Please see the history of present illness. Otherwise, complete review of systems is positive for none.  All other systems are reviewed and negative.   Physical Exam: VS:  BP 122/83   Pulse 71   Ht _0  (1.626 m)   Wt 160 lb (72.6 kg)   BMI 27.46 kg/m , BMI Body mass index is 27.46 kg/m.  Wt Readings from Last 3 Encounters:  07/20/16 160 lb (72.6 kg)  06/27/16 161 lb (73 kg)  05/30/16 165 lb (74.8 kg)    Appears comfortable at rest.  HEENT: Conjunctiva and lids normal, oropharynx clear.  Neck: Supple, no elevated JVP or carotid bruits, no thyromegaly.  Lungs: Clear to auscultation, nonlabored breathing at rest.  Cardiac: Regular rate and rhythm, crisp prosthetic sound in S1, no S3 or significant systolic murmur, no pericardial rub.  Abdomen: Soft, nontender, bowel sounds present, no guarding or rebound.  Extremities: No pitting edema, distal pulses 2+.  ECG: I personally reviewed the tracing from 05/23/20 which showed sinus rhythm with counterclockwise  rotation.  Recent Labwork: 05/24/2016: Magnesium 2.0 05/28/2016: ALT 32; AST 56 05/29/2016: Hemoglobin 9.1; Platelets 252 05/30/2016: BUN <5; Creatinine, Ser 0.66; Potassium 3.3; Sodium 139 06/20/2016: TSH 0.68     Component Value Date/Time   CHOL 229 (H) 09/18/2010 1156   TRIG 111 09/18/2010 1156   HDL 47 09/18/2010 1156   CHOLHDL 4.9 09/18/2010 1156   VLDL 22 09/18/2010 1156   LDLCALC 160 (H) 09/18/2010 1156    Other Studies Reviewed Today:  Echocardiogram 07/17/2016: Study Conclusions  - Left ventricle: The cavity size was normal. Wall thickness was   normal. Systolic function was normal. The estimated ejection   fraction was in the range of 55% to 60%. Wall motion was normal;   there were no regional  wall motion abnormalities. Features are   consistent with a pseudonormal left ventricular filling pattern,   with concomitant abnormal relaxation and increased filling   pressure (grade 2 diastolic dysfunction). - Aortic valve: Mildly calcified annulus. Trileaflet. Valve area   (VTI): 1.93 cm^2. Valve area (Vmax): 1.72 cm^2. Valve area   (Vmean): 1.78 cm^2. - Mitral valve: A St. Jude Medical mechanical prosthesis was   present. There was trivial regurgitation. Mean gradient (D): 3 mm   Hg. - Left atrium: The atrium was mildly to moderately dilated. - Right atrium: Central venous pressure (est): 3 mm Hg. - Tricuspid valve: There was trivial regurgitation. - Pulmonary arteries: PA peak pressure: 17 mm Hg (S). - Pericardium, extracardiac: A prominent pericardial fat pad was   present.  Impressions:  - Normal LV wall thickness with LVEF 55-60% and grade 2 diastolic   dysfunction. Mild to moderate left atrial enlargement. St. Jude   mechanical prosthesis in mitral position with normal gradient.   Trivial tricuspid regurgitation with PASP estimated 17 mmHg.  Assessment and Plan:  1. History of rheumatic heart disease status post St. Jude MVR. She is on Coumadin, followed in  the anticoagulation clinic. I reviewed her recent echocardiogram results which are overall stable. Continue with observation.  2. Status post ruptured appendix requiring emergent surgery in April. She was temporarily off Coumadin at that time.  Current medicines were reviewed with the patient today.  Disposition: Follow-up in 6 months.  Signed, Satira Sark, MD, [facility] 07/20/2016 11:38 AM    [place] [place]. [address] [place], [place] [address] Phone: [phone]; Fax: [phone]

## 2016-07-20 ENCOUNTER — Encounter: Payer: Self-pay | Admitting: Cardiology

## 2016-07-20 ENCOUNTER — Ambulatory Visit (INDEPENDENT_AMBULATORY_CARE_PROVIDER_SITE_OTHER): Payer: BLUE CROSS/BLUE SHIELD | Admitting: Cardiology

## 2016-07-20 ENCOUNTER — Encounter (HOSPITAL_COMMUNITY): Admission: RE | Admit: 2016-07-20 | Payer: BLUE CROSS/BLUE SHIELD | Source: Ambulatory Visit

## 2016-07-20 VITALS — BP 122/83 | HR 71 | Ht 64.0 in | Wt 160.0 lb

## 2016-07-20 DIAGNOSIS — K352 Acute appendicitis with generalized peritonitis: Secondary | ICD-10-CM

## 2016-07-20 DIAGNOSIS — Z952 Presence of prosthetic heart valve: Secondary | ICD-10-CM

## 2016-07-20 DIAGNOSIS — K3532 Acute appendicitis with perforation and localized peritonitis, without abscess: Secondary | ICD-10-CM

## 2016-07-20 NOTE — Patient Instructions (Addendum)
Your physician wants you to follow-up in:  6 months with Dr McDowell You will receive a reminder letter in the mail two months in advance. If you don't receive a letter, please call our office to schedule the follow-up appointment.    Your physician recommends that you continue on your current medications as directed. Please refer to the Current Medication list given to you today.    If you need a refill on your cardiac medications before your next appointment, please call your pharmacy.      No testing or lab work ordered today.      Thank you for choosing [facility] !         

## 2016-07-26 ENCOUNTER — Encounter (HOSPITAL_COMMUNITY)
Admission: RE | Admit: 2016-07-26 | Discharge: 2016-07-26 | Disposition: A | Discharge status: Home / Self Care | Payer: BLUE CROSS/BLUE SHIELD | Source: Ambulatory Visit | Attending: Rheumatology | Admitting: Rheumatology

## 2016-07-26 ENCOUNTER — Encounter (HOSPITAL_COMMUNITY): Payer: Self-pay

## 2016-07-26 DIAGNOSIS — M069 Rheumatoid arthritis, unspecified: Secondary | ICD-10-CM | POA: Diagnosis not present

## 2016-07-26 MED ORDER — ACETAMINOPHEN 325 MG PO TABS
650.0000 mg | ORAL_TABLET | Freq: Once | ORAL | Status: AC
  Administered 2016-07-26: 650 mg via ORAL

## 2016-07-26 MED ORDER — METHYLPREDNISOLONE SODIUM SUCC 125 MG IJ SOLR
INTRAMUSCULAR | Status: AC
  Filled 2016-07-26: qty 2

## 2016-07-26 MED ORDER — SODIUM CHLORIDE 0.9 % IV SOLN
INTRAVENOUS | Status: DC
  Administered 2016-07-26: 09:00:00 via INTRAVENOUS

## 2016-07-26 MED ORDER — DIPHENHYDRAMINE HCL 50 MG/ML IJ SOLN
INTRAMUSCULAR | Status: AC
  Filled 2016-07-26: qty 1

## 2016-07-26 MED ORDER — SODIUM CHLORIDE 0.9% FLUSH
INTRAVENOUS | Status: AC
  Filled 2016-07-26: qty 10

## 2016-07-26 MED ORDER — DIPHENHYDRAMINE HCL 50 MG/ML IJ SOLN
25.0000 mg | Freq: Once | INTRAMUSCULAR | Status: AC
  Administered 2016-07-26: 25 mg via INTRAVENOUS

## 2016-07-26 MED ORDER — SODIUM CHLORIDE 0.9 % IV SOLN: 1000.0000 mg | Freq: Once | INTRAVENOUS | Status: DC

## 2016-07-26 MED ORDER — ACETAMINOPHEN 325 MG PO TABS
ORAL_TABLET | ORAL | Status: AC
  Filled 2016-07-26: qty 2

## 2016-07-26 MED ORDER — METHYLPREDNISOLONE SODIUM SUCC 125 MG IJ SOLR
125.0000 mg | Freq: Once | INTRAMUSCULAR | Status: AC
  Administered 2016-07-26: 125 mg via INTRAVENOUS
  Filled 2016-07-26: qty 2

## 2016-07-26 MED ORDER — SODIUM CHLORIDE 0.9 % IV SOLN
1000.0000 mg | Freq: Once | INTRAVENOUS | Status: AC
  Administered 2016-07-26: 1000 mg via INTRAVENOUS
  Filled 2016-07-26: qty 100

## 2016-07-30 ENCOUNTER — Ambulatory Visit (INDEPENDENT_AMBULATORY_CARE_PROVIDER_SITE_OTHER): Payer: BLUE CROSS/BLUE SHIELD | Admitting: *Deleted

## 2016-07-30 DIAGNOSIS — Z5181 Encounter for therapeutic drug level monitoring: Secondary | ICD-10-CM | POA: Diagnosis not present

## 2016-07-30 DIAGNOSIS — Z952 Presence of prosthetic heart valve: Secondary | ICD-10-CM

## 2016-07-30 LAB — POCT INR: INR: 3

## 2016-08-06 ENCOUNTER — Encounter: Payer: Self-pay | Admitting: Gastroenterology

## 2016-08-10 ENCOUNTER — Encounter (HOSPITAL_COMMUNITY)
Admission: RE | Admit: 2016-08-10 | Discharge: 2016-08-10 | Disposition: A | Discharge status: Home / Self Care | Payer: BLUE CROSS/BLUE SHIELD | Source: Ambulatory Visit | Attending: Rheumatology | Admitting: Rheumatology

## 2016-08-10 ENCOUNTER — Encounter (HOSPITAL_COMMUNITY): Payer: Self-pay

## 2016-08-10 DIAGNOSIS — M069 Rheumatoid arthritis, unspecified: Secondary | ICD-10-CM | POA: Diagnosis not present

## 2016-08-10 MED ORDER — ACETAMINOPHEN 325 MG PO TABS
650.0000 mg | ORAL_TABLET | Freq: Once | ORAL | Status: AC
Start: 2016-08-10 — End: 2016-08-10
  Administered 2016-08-10: 650 mg via ORAL

## 2016-08-10 MED ORDER — ACETAMINOPHEN 325 MG PO TABS
ORAL_TABLET | ORAL | Status: AC
  Filled 2016-08-10: qty 2

## 2016-08-10 MED ORDER — METHYLPREDNISOLONE SODIUM SUCC 125 MG IJ SOLR
INTRAMUSCULAR | Status: AC
  Filled 2016-08-10: qty 2

## 2016-08-10 MED ORDER — DIPHENHYDRAMINE HCL 50 MG/ML IJ SOLN
25.0000 mg | Freq: Once | INTRAMUSCULAR | Status: AC
  Administered 2016-08-10: 25 mg via INTRAVENOUS

## 2016-08-10 MED ORDER — METHYLPREDNISOLONE SODIUM SUCC 125 MG IJ SOLR
125.0000 mg | Freq: Once | INTRAMUSCULAR | Status: AC
Start: 2016-08-10 — End: 2016-08-10
  Administered 2016-08-10: 125 mg via INTRAVENOUS

## 2016-08-10 MED ORDER — DIPHENHYDRAMINE HCL 50 MG/ML IJ SOLN
INTRAMUSCULAR | Status: AC
  Filled 2016-08-10: qty 1

## 2016-08-10 MED ORDER — SODIUM CHLORIDE 0.9 % IV SOLN
Freq: Once | INTRAVENOUS | Status: AC
  Administered 2016-08-10: 250 mL via INTRAVENOUS

## 2016-08-10 MED ORDER — SODIUM CHLORIDE 0.9 % IV SOLN
1000.0000 mg | Freq: Once | INTRAVENOUS | Status: AC
  Administered 2016-08-10: 1000 mg via INTRAVENOUS
  Filled 2016-08-10: qty 100

## 2016-08-14 ENCOUNTER — Encounter (HOSPITAL_COMMUNITY): Payer: Self-pay | Admitting: *Deleted

## 2016-08-14 ENCOUNTER — Emergency Department (HOSPITAL_COMMUNITY)
Admission: EM | Admit: 2016-08-14 | Discharge: 2016-08-15 | Disposition: A | Discharge status: Home / Self Care | Payer: BLUE CROSS/BLUE SHIELD | Attending: Emergency Medicine | Admitting: Emergency Medicine

## 2016-08-14 ENCOUNTER — Emergency Department (HOSPITAL_COMMUNITY): Payer: BLUE CROSS/BLUE SHIELD

## 2016-08-14 DIAGNOSIS — S59912A Unspecified injury of left forearm, initial encounter: Secondary | ICD-10-CM | POA: Diagnosis not present

## 2016-08-14 DIAGNOSIS — W109XXA Fall (on) (from) unspecified stairs and steps, initial encounter: Secondary | ICD-10-CM | POA: Insufficient documentation

## 2016-08-14 DIAGNOSIS — E039 Hypothyroidism, unspecified: Secondary | ICD-10-CM | POA: Diagnosis not present

## 2016-08-14 DIAGNOSIS — S300XXA Contusion of lower back and pelvis, initial encounter: Secondary | ICD-10-CM | POA: Diagnosis not present

## 2016-08-14 DIAGNOSIS — Z853 Personal history of malignant neoplasm of breast: Secondary | ICD-10-CM | POA: Insufficient documentation

## 2016-08-14 DIAGNOSIS — M545 Low back pain: Secondary | ICD-10-CM | POA: Insufficient documentation

## 2016-08-14 DIAGNOSIS — W19XXXA Unspecified fall, initial encounter: Secondary | ICD-10-CM

## 2016-08-14 DIAGNOSIS — Z7901 Long term (current) use of anticoagulants: Secondary | ICD-10-CM | POA: Diagnosis not present

## 2016-08-14 DIAGNOSIS — Y939 Activity, unspecified: Secondary | ICD-10-CM | POA: Diagnosis not present

## 2016-08-14 DIAGNOSIS — M79632 Pain in left forearm: Secondary | ICD-10-CM | POA: Diagnosis not present

## 2016-08-14 DIAGNOSIS — Y999 Unspecified external cause status: Secondary | ICD-10-CM | POA: Insufficient documentation

## 2016-08-14 DIAGNOSIS — Y929 Unspecified place or not applicable: Secondary | ICD-10-CM | POA: Insufficient documentation

## 2016-08-14 DIAGNOSIS — Z79899 Other long term (current) drug therapy: Secondary | ICD-10-CM | POA: Insufficient documentation

## 2016-08-14 DIAGNOSIS — S40812A Abrasion of left upper arm, initial encounter: Secondary | ICD-10-CM | POA: Diagnosis not present

## 2016-08-14 DIAGNOSIS — T07XXXA Unspecified multiple injuries, initial encounter: Secondary | ICD-10-CM | POA: Diagnosis not present

## 2016-08-14 DIAGNOSIS — S40022A Contusion of left upper arm, initial encounter: Secondary | ICD-10-CM | POA: Diagnosis not present

## 2016-08-14 DIAGNOSIS — Z87891 Personal history of nicotine dependence: Secondary | ICD-10-CM | POA: Diagnosis not present

## 2016-08-14 LAB — CBC WITH DIFFERENTIAL/PLATELET
BASOS ABS: 0 10*3/uL (ref 0.0–0.1)
Basophils Relative: 0 %
Eosinophils Absolute: 0.2 10*3/uL (ref 0.0–0.7)
Eosinophils Relative: 2 %
HEMATOCRIT: 35.7 % — AB (ref 36.0–46.0)
HEMOGLOBIN: 12.1 g/dL (ref 12.0–15.0)
LYMPHS PCT: 13 %
Lymphs Abs: 0.9 10*3/uL (ref 0.7–4.0)
MCH: 29.8 pg (ref 26.0–34.0)
MCHC: 33.9 g/dL (ref 30.0–36.0)
MCV: 87.9 fL (ref 78.0–100.0)
Monocytes Absolute: 0.5 10*3/uL (ref 0.1–1.0)
Monocytes Relative: 7 %
NEUTROS ABS: 5.2 10*3/uL (ref 1.7–7.7)
NEUTROS PCT: 78 %
PLATELETS: 177 10*3/uL (ref 150–400)
RBC: 4.06 MIL/uL (ref 3.87–5.11)
RDW: 14.1 % (ref 11.5–15.5)
WBC: 6.7 10*3/uL (ref 4.0–10.5)

## 2016-08-14 LAB — PROTIME-INR
INR: 2.36
Prothrombin Time: 26.3 seconds — ABNORMAL HIGH (ref 11.4–15.2)

## 2016-08-14 MED ORDER — ONDANSETRON HCL 4 MG PO TABS
4.0000 mg | ORAL_TABLET | Freq: Once | ORAL | Status: AC
  Administered 2016-08-14: 4 mg via ORAL
  Filled 2016-08-14: qty 1

## 2016-08-14 MED ORDER — HYDROCODONE-ACETAMINOPHEN 5-325 MG PO TABS
2.0000 | ORAL_TABLET | Freq: Once | ORAL | Status: AC
  Administered 2016-08-14: 2 via ORAL
  Filled 2016-08-14: qty 2

## 2016-08-14 NOTE — ED Triage Notes (Signed)
Pt c/o buttock pain and left arm pain after slipping and falling down 3 wooden steps; pt has large abrasion to bottom of left arm

## 2016-08-14 NOTE — ED Provider Notes (Signed)
[facility] DEPT Provider Note   CSN: [id number] Arrival date & time: 08/14/16  2126     History   Chief Complaint Chief Complaint  Patient presents with  . Fall    HPI [name] is a 46 y.o. female.  Patient is a 46 year old female who presents to the emergency department following a fall.  The patient states that she fell down about 3 steps approximately an hour before she arrived here in the emergency department. She states she was wearing flip-flops and the steps were wet following brain. She landed primarily on her buttocks, but she also injured her lower back and her left arm. It is of note that the patient is on Coumadin that she takes because of an artificial valve. She presents to the emergency department for evaluation. The patient states that she did not have any loss of consciousness. She 58 states that she did not hit her head or hurt her neck. She denies any chest pain, or difficulty with breathing. She denies any difficulty with her legs.      Past Medical History:  Diagnosis Date  . Abnormal Papanicolaou smear of cervix with positive human papilloma virus (HPV) test 08/17/2015  . Anticoagulation goal of INR 2.5 to 3.5   . Anxiety   . B12 deficiency   . BRCA1 negative   . BRCA2 negative   . Breast cancer ([place]) 2011   Left side  . Bursitis of right knee    Septic bursitis  . Drug-induced hepatitis    States per her rheumatologist, transaminases elevated but normalized after drug removed for   . Fibromyalgia   . 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 History of abnormal cervical Pap smear 08/12/2015  . Mitral valve disease, rheumatic    St. Jude prosthesis  . Peripheral neuropathy   . Pneumonia   . Raynaud disease   . Raynaud disease   . SLE (systemic lupus erythematosus) ([place])     Patient Active Problem List   Diagnosis Date Noted  . Appendicitis with peritoneal abscess 05/23/2016  . Sepsis, unspecified  organism ([place]) 05/22/2016  . AKI (acute kidney injury) ([place]) 05/22/2016  . Abdominal pain 05/21/2016  . Appendicitis with abscess 05/21/2016  . Volume depletion 05/21/2016  . History of breast cancer 05/21/2016  . Vaginal discharge 11/14/2015  . Vaginal burning 11/14/2015  . BV (bacterial vaginosis) 11/14/2015  . Abnormal Papanicolaou smear of cervix with positive human papilloma virus (HPV) test 08/17/2015  . History of abnormal cervical Pap smear 08/12/2015  . Primary hypothyroidism 12/07/2014  . Primary hypercoagulable state ([place]) 12/14/2013  . Encounter for monitoring cardiotoxic drug therapy 11/23/2013  . Gastroparesis 08/13/2013  . Encounter for therapeutic drug monitoring 03/18/2013  . Dyspepsia 03/12/2013  . Dyspnea 11/14/2012  . Anticoagulation goal of INR 2.5 to 3.5   . Peripheral neuropathy   . B12 deficiency 08/21/2010  . 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 BRCA1 negative 08/16/2010  . BRCA2 negative 08/16/2010  . Long term current use of anticoagulant therapy 05/17/2010  . Malignant neoplasm of upper outer quadrant of female breast ([place]) 02/24/2010  . History of mitral valve replacement with mechanical valve 11/10/2009  . Systemic lupus erythematosus ([place]) 11/10/2009    Past Surgical History:  Procedure Laterality Date  . APPENDECTOMY N/A 05/23/2016   Procedure: OPEN APPENDECTOMY WITH DRAINAGE OF ABSCESS;  Surgeon: Fanny Skates, MD;  Location: [facility];  Service: General;  Laterality: N/A;  .  BREAST SURGERY    . CHOLECYSTECTOMY    . ENDOMETRIAL ABLATION  2008   She no longer has menses  . ESOPHAGOGASTRODUODENOSCOPY N/A 03/25/2013   Dr. Raliegh Scarlet reflux esophagitis-likely source of patient's symptoms (patulous EG junction). Hiatal hernia otherwise normal  . Insertion expander left breast  11/16/10  . MASTECTOMY    . MASTOPEXY  02/14/2011   Procedure: MASTOPEXY;  Surgeon: Macon Large;  Location: [facility];  Service:  Plastics;  Laterality: Bilateral;  Right Breast Reduction   . MITRAL VALVE REPLACEMENT    . TISSUE EXPANDER PLACEMENT  02/14/2011   Procedure: TISSUE EXPANDER;  Surgeon: Macon Large;  Location: [facility];  Service: Plastics;  Laterality: Bilateral;  Left Breast Remove Tissue Expander Placement of Implant Breast Reconstruction  . TUBAL LIGATION      OB History    Gravida Para Term Preterm AB Living   2 2       2    SAB TAB Ectopic Multiple Live Births           2       Home Medications    Prior to Admission medications   Medication Sig Start Date End Date Taking? Authorizing Provider  Albuterol (VENTOLIN IN) Inhale into the lungs as needed.    [provider]  ALPRAZolam Duanne Moron) 0.5 MG tablet Take 0.5 mg by mouth at bedtime.  11/02/13   [provider]  ANORO ELLIPTA 62.5-25 MCG/INH AEPB Inhale 1 puff into the lungs daily.  05/15/16   [provider]  cyanocobalamin (,VITAMIN B-12,) 1000 MCG/ML injection Inject 1 mL (1,000 mcg total) into the muscle every 30 (thirty) days. 12/19/10   Everardo All, MD  DEXILANT 60 MG capsule TAKE ONE CAPSULE BY MOUTH DAILY. 07/13/16   Mahala Menghini, PA-C  furosemide (LASIX) 40 MG tablet TAKE ONE TABLET BY MOUTH ONCE DAILY. 02/07/16   Satira Sark, MD  gabapentin (NEURONTIN) 600 MG tablet Take 600 mg by mouth 2 (two) times daily.     [provider]  ibuprofen (ADVIL,MOTRIN) 800 MG tablet Take 800 mg by mouth every 6 (six) hours as needed. Reported on 08/30/2015    [provider]  levothyroxine (SYNTHROID, LEVOTHROID) 100 MCG tablet Take 1 tablet (100 mcg total) by mouth daily. 06/27/16   Cassandria Anger, MD  magic mouthwash w/lidocaine SOLN Take 5 mLs by mouth 4 (four) times daily. 05/30/16   Rolm Bookbinder, MD  metoCLOPramide (REGLAN) 5 MG tablet Take 5 mg by mouth 2 (two) times daily.    [provider]  oxyCODONE-acetaminophen (PERCOCET/ROXICET) 5-325 MG tablet Take 1-2 tablets by  mouth every 4 (four) hours as needed.  06/27/16   [provider]  potassium chloride (KLOR-CON 10) 10 MEQ tablet Take one tablet daily as directed 11/09/13   [provider]  promethazine (PHENERGAN) 12.5 MG tablet Take 1 tablet (12.5 mg total) by mouth every 4 (four) hours as needed for nausea or vomiting. 05/30/16   Rolm Bookbinder, MD  riTUXimab (RITUXAN) 100 MG/10ML injection Inject into the vein. Every 4 months    [provider]  rOPINIRole (REQUIP) 0.5 MG tablet Take 1 mg by mouth at bedtime.     [provider]  warfarin (COUMADIN) 5 MG tablet Take 1 1/2 tablets daily except 2 tablets on Tuesdays 06/13/16   Satira Sark, MD  zolpidem (AMBIEN) 10 MG tablet Take 10 mg by mouth at bedtime as needed for sleep.  12/19/10   de Norva Karvonen,  Jana Half, MD    Family History Family History  Problem Relation Age of Onset  . Hypertension Mother   . Hyperlipidemia Mother   . Hypertension Father   . Colon cancer Neg Hx     Social History Social History  Substance Use Topics  . Smoking status: Former Smoker    Packs/day: 1.00    Years: 15.00    Types: Cigarettes    Quit date: 02/19/2006  . Smokeless tobacco: Never Used     Comment: Quit x 10 years  . Alcohol use 0.0 oz/week     Comment: Occasional     Allergies   Other   Review of Systems Review of Systems  Constitutional: Negative for activity change and appetite change.  HENT: Negative for congestion, ear discharge, ear pain, facial swelling, nosebleeds, rhinorrhea, sneezing and tinnitus.   Eyes: Negative for photophobia, pain and discharge.  Respiratory: Negative for cough, choking, shortness of breath and wheezing.   Cardiovascular: Negative for chest pain, palpitations and leg swelling.  Gastrointestinal: Negative for abdominal pain, blood in stool, constipation, diarrhea, nausea and vomiting.  Genitourinary: Negative for difficulty urinating, dysuria, flank pain, frequency and hematuria.    Musculoskeletal: Positive for back pain. Negative for gait problem, myalgias and neck pain.       Buttocks pain  Skin: Negative for color change, rash and wound.       Abrasions  Neurological: Negative for dizziness, seizures, syncope, facial asymmetry, speech difficulty, weakness and numbness.  Hematological: Negative for adenopathy. Does not bruise/bleed easily.  Psychiatric/Behavioral: Negative for agitation, confusion, hallucinations, self-injury and suicidal ideas. The patient is not nervous/anxious.      Physical Exam Updated Vital Signs BP 125/79 (BP Location: Right Arm)   Pulse 81   Temp 98.5 F (36.9 C) (Oral)   Resp 18   Ht 5' 4"  (1.626 m)   Wt 71.7 kg (158 lb)   SpO2 100%   BMI 27.12 kg/m   Physical Exam  Constitutional: She is oriented to person, place, and time. She appears well-developed and well-nourished.  Non-toxic appearance.  HENT:  Head: Normocephalic.  Right Ear: Tympanic membrane and external ear normal.  Left Ear: Tympanic membrane and external ear normal.  Eyes: EOM and lids are normal. Pupils are equal, round, and reactive to light.  Neck: Normal range of motion. Neck supple. Carotid bruit is not present.  Cardiovascular: Normal rate, regular rhythm, normal heart sounds, intact distal pulses and normal pulses.   Pulmonary/Chest: Breath sounds normal. No respiratory distress.  Abdominal: Soft. Bowel sounds are normal. There is no tenderness. There is no guarding.  Musculoskeletal: Normal range of motion.       Lumbar back: She exhibits pain.       Left forearm: She exhibits tenderness. She exhibits no deformity.  Pain of the lumbar spine area, the paraspinal muscle area, as well as the sacral coccyx area. No palpable deformity or step-off.  Lymphadenopathy:       Head (right side): No submandibular adenopathy present.       Head (left side): No submandibular adenopathy present.    She has no cervical adenopathy.  Neurological: She is alert and  oriented to person, place, and time. She has normal strength. No cranial nerve deficit or sensory deficit.  Skin: Skin is warm and dry.  Psychiatric: She has a normal mood and affect. Her speech is normal.  Nursing note and vitals reviewed.    ED Treatments / Results  Labs (all labs ordered  are listed, but only abnormal results are displayed) Labs Reviewed  CBC WITH DIFFERENTIAL/PLATELET  PROTIME-INR    EKG  EKG Interpretation None       Radiology No results found.  Procedures Procedures (including critical care time)  Medications Ordered in ED Medications - No data to display   Initial Impression / Assessment and Plan / ED Course  I have reviewed the triage vital signs and the nursing notes.  Pertinent labs & imaging results that were available during my care of the patient were reviewed by me and considered in my medical decision making (see chart for details).       Final Clinical Impressions(s) / ED Diagnoses MDm Patient sustained a fall. She did not have loss of consciousness. She complains of pain in the lumbar spine area. X-ray is negative for acute fracture. There is degenerative disc disease at the L2-L3 area. She also complains of pain at the coccyx area and the sacral coccyx area is negative for fracture or dislocation. There is pain of the forearm on the left. And there is no evidence of fracture or dislocation of this area.  I've advised the patient of the findings on the x-rays. And I have advised the patient on the findings on examination. Patient will use on Tylenol for mild pain, she will use Norco for more severe pain. She will follow-up with her primary physician in the office if any changes or problems. Patient is in agreement with this plan.    Final diagnoses:  Contusion, multiple sites  Abrasion of left upper extremity, initial encounter  Fall, initial encounter    New Prescriptions Discharge Medication List as of 08/15/2016 12:08 AM      START taking these medications   Details  HYDROcodone-acetaminophen (NORCO/VICODIN) 5-325 MG tablet Take 1 tablet by mouth every 4 (four) hours as needed., Starting Wed 08/15/2016, Print         Marshell Levan, [place], PA-C 08/16/16 1731    Milton Ferguson, MD 08/17/16 1015

## 2016-08-15 ENCOUNTER — Ambulatory Visit: Payer: BLUE CROSS/BLUE SHIELD | Admitting: Gastroenterology

## 2016-08-15 MED ORDER — HYDROCODONE-ACETAMINOPHEN 5-325 MG PO TABS: 1.0000 | ORAL_TABLET | ORAL | 0 refills | Status: DC | PRN

## 2016-08-15 NOTE — Discharge Instructions (Signed)
Please cleanse your abrasion area daily with soap and water. Apply fresh dressing daily until healed. You have multiple contusions. The x-ray of your lower back, coccyx area, and forearm are all negative for fracture or dislocation. Please use Tylenol Extra Strength every 4 hours for mild pain. May use Norco for more severe pain. This medication may cause drowsiness, please use with caution. Please see Dr. Hilma Favors for office follow-up as soon as possible. If you notice bleeding or excessive bruising, especially being on the Coumadin please return to the emergency department immediately.

## 2016-08-20 ENCOUNTER — Ambulatory Visit (INDEPENDENT_AMBULATORY_CARE_PROVIDER_SITE_OTHER): Payer: BLUE CROSS/BLUE SHIELD | Admitting: *Deleted

## 2016-08-20 DIAGNOSIS — Z952 Presence of prosthetic heart valve: Secondary | ICD-10-CM | POA: Diagnosis not present

## 2016-08-20 DIAGNOSIS — Z5181 Encounter for therapeutic drug level monitoring: Secondary | ICD-10-CM | POA: Diagnosis not present

## 2016-08-20 LAB — POCT INR: INR: 2.9

## 2016-08-23 DIAGNOSIS — Z6826 Body mass index (BMI) 26.0-26.9, adult: Secondary | ICD-10-CM | POA: Diagnosis not present

## 2016-08-23 DIAGNOSIS — Z1389 Encounter for screening for other disorder: Secondary | ICD-10-CM | POA: Diagnosis not present

## 2016-08-23 DIAGNOSIS — G894 Chronic pain syndrome: Secondary | ICD-10-CM | POA: Diagnosis not present

## 2016-09-06 ENCOUNTER — Inpatient Hospital Stay (HOSPITAL_COMMUNITY): Admission: RE | Admit: 2016-09-06 | Payer: BLUE CROSS/BLUE SHIELD | Source: Ambulatory Visit

## 2016-09-10 ENCOUNTER — Ambulatory Visit (INDEPENDENT_AMBULATORY_CARE_PROVIDER_SITE_OTHER): Payer: BLUE CROSS/BLUE SHIELD | Admitting: *Deleted

## 2016-09-10 DIAGNOSIS — Z952 Presence of prosthetic heart valve: Secondary | ICD-10-CM

## 2016-09-10 DIAGNOSIS — Z5181 Encounter for therapeutic drug level monitoring: Secondary | ICD-10-CM | POA: Diagnosis not present

## 2016-09-10 LAB — POCT INR: INR: 4.4

## 2016-09-19 DIAGNOSIS — Z1389 Encounter for screening for other disorder: Secondary | ICD-10-CM | POA: Diagnosis not present

## 2016-09-19 DIAGNOSIS — E538 Deficiency of other specified B group vitamins: Secondary | ICD-10-CM | POA: Diagnosis not present

## 2016-09-19 DIAGNOSIS — L578 Other skin changes due to chronic exposure to nonionizing radiation: Secondary | ICD-10-CM | POA: Diagnosis not present

## 2016-09-19 DIAGNOSIS — E663 Overweight: Secondary | ICD-10-CM | POA: Diagnosis not present

## 2016-09-19 DIAGNOSIS — G894 Chronic pain syndrome: Secondary | ICD-10-CM | POA: Diagnosis not present

## 2016-09-19 DIAGNOSIS — Z6826 Body mass index (BMI) 26.0-26.9, adult: Secondary | ICD-10-CM | POA: Diagnosis not present

## 2016-09-24 ENCOUNTER — Ambulatory Visit (INDEPENDENT_AMBULATORY_CARE_PROVIDER_SITE_OTHER): Payer: BLUE CROSS/BLUE SHIELD | Admitting: *Deleted

## 2016-09-24 DIAGNOSIS — Z952 Presence of prosthetic heart valve: Secondary | ICD-10-CM | POA: Diagnosis not present

## 2016-09-24 DIAGNOSIS — Z5181 Encounter for therapeutic drug level monitoring: Secondary | ICD-10-CM

## 2016-09-24 LAB — POCT INR: INR: 3.4

## 2016-10-04 ENCOUNTER — Encounter: Payer: Self-pay | Admitting: Gastroenterology

## 2016-10-04 ENCOUNTER — Ambulatory Visit (INDEPENDENT_AMBULATORY_CARE_PROVIDER_SITE_OTHER): Payer: BLUE CROSS/BLUE SHIELD | Admitting: Gastroenterology

## 2016-10-04 VITALS — BP 106/75 | HR 66 | Temp 98.9°F | Ht 64.0 in | Wt 156.0 lb

## 2016-10-04 DIAGNOSIS — K3184 Gastroparesis: Secondary | ICD-10-CM

## 2016-10-04 DIAGNOSIS — R945 Abnormal results of liver function studies: Secondary | ICD-10-CM

## 2016-10-04 DIAGNOSIS — R7989 Other specified abnormal findings of blood chemistry: Secondary | ICD-10-CM | POA: Diagnosis not present

## 2016-10-04 MED ORDER — METOCLOPRAMIDE HCL 5 MG PO TABS: 5.0000 mg | ORAL_TABLET | Freq: Two times a day (BID) | ORAL | 3 refills | Status: DC

## 2016-10-04 MED ORDER — RANITIDINE HCL 150 MG PO TABS: 150.0000 mg | ORAL_TABLET | Freq: Every day | ORAL | 3 refills | Status: DC

## 2016-10-04 NOTE — Progress Notes (Signed)
Referring Provider: Sharilyn Sites, MD Primary Care Physician:  Sharilyn Sites, MD Primary GI: Dr. Gala Romney   Chief Complaint  Patient presents with  . Gastroparesis    f/u, doing ok    HPI:   [name] is a 46 y.o. female presenting today with a history of erosive esophagitis and gastroparesis. Has done well on low dose Reglan BID historically, PPI. Zofran now covered by insurance and keeps on hand if needed. Unable to take erythromycin as she is on chronic Coumadin.   In interim since last seen, she underwent urgent appendectomy. Admitted with sepsis secondary to appendicitis with abscess April 2018.  with Bloating is worse since appendectomy. Sometimes gets build up of reflux in esophagus. Having to take Rolaids.   Was on methotrexate for about a year then taken off due to elevated LFTs. From review of labs, it appears this may have been in 2015. 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 However, I question if this was simply drug-induced hepatitis. I do not have any autoimmune evaluation in her chart.  CT abd/pelvis in Dec 2014 with normal liver and spleen. Most recent CT April 2018 with enlarged liver spanning over 19.5 cm. March 2017: Negative Hep B surface antigen, Negative Hep C antibody, negative Hep A IgM, negative Hep B core antibody.   Past Medical History:  Diagnosis Date  . Abnormal Papanicolaou smear of cervix with positive human papilloma virus (HPV) test 08/17/2015  . Anticoagulation goal of INR 2.5 to 3.5   . Anxiety   . B12 deficiency   . BRCA1 negative   . BRCA2 negative   . Breast cancer ([place]) 2011   Left side  . Bursitis of right knee    Septic bursitis  . Drug-induced hepatitis    States per her rheumatologist, transaminases elevated but normalized after drug removed for   . Fibromyalgia   . 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 History of abnormal cervical Pap smear 08/12/2015  . Mitral valve disease,  rheumatic    St. Jude prosthesis  . Peripheral neuropathy   . Pneumonia   . Raynaud disease   . Raynaud disease   . SLE (systemic lupus erythematosus) ([place])     Past Surgical History:  Procedure Laterality Date  . APPENDECTOMY N/A 05/23/2016   Procedure: OPEN APPENDECTOMY WITH DRAINAGE OF ABSCESS;  Surgeon: Fanny Skates, MD;  Location: [facility];  Service: General;  Laterality: N/A;  . BREAST SURGERY    . CHOLECYSTECTOMY    . ENDOMETRIAL ABLATION  2008   She no longer has menses  . ESOPHAGOGASTRODUODENOSCOPY N/A 03/25/2013   Dr. Raliegh Scarlet reflux esophagitis-likely source of patient's symptoms (patulous EG junction). Hiatal hernia otherwise normal  . Insertion expander left breast  11/16/10  . MASTECTOMY    . MASTOPEXY  02/14/2011   Procedure: MASTOPEXY;  Surgeon: Macon Large;  Location: [facility];  Service: Plastics;  Laterality: Bilateral;  Right Breast Reduction   . MITRAL VALVE REPLACEMENT    . TISSUE EXPANDER PLACEMENT  02/14/2011   Procedure: TISSUE EXPANDER;  Surgeon: Macon Large;  Location: [facility];  Service: Plastics;  Laterality: Bilateral;  Left Breast Remove Tissue Expander Placement of Implant Breast Reconstruction  . TUBAL LIGATION      Current Outpatient Prescriptions  Medication Sig Dispense Refill  . Albuterol (VENTOLIN IN) Inhale into the lungs as needed.    . ALPRAZolam (XANAX) 0.5 MG tablet Take 0.5 mg by  mouth at bedtime.     Jearl Klinefelter ELLIPTA 62.5-25 MCG/INH AEPB Inhale 1 puff into the lungs daily.     . cyanocobalamin (,VITAMIN B-12,) 1000 MCG/ML injection Inject 1 mL (1,000 mcg total) into the muscle every 30 (thirty) days. 10 mL prn  . DEXILANT 60 MG capsule TAKE ONE CAPSULE BY MOUTH DAILY. 90 capsule 3  . furosemide (LASIX) 40 MG tablet TAKE ONE TABLET BY MOUTH ONCE DAILY. 90 tablet 3  . gabapentin (NEURONTIN) 600 MG tablet Take 600 mg by mouth 2 (two) times daily.     Marland Kitchen HYDROcodone-acetaminophen (NORCO/VICODIN) 5-325 MG tablet Take 1 tablet by mouth every 4  (four) hours as needed. 12 tablet 0  . ibuprofen (ADVIL,MOTRIN) 800 MG tablet Take 800 mg by mouth every 6 (six) hours as needed. Reported on 08/30/2015    . levothyroxine (SYNTHROID, LEVOTHROID) 100 MCG tablet Take 1 tablet (100 mcg total) by mouth daily. 90 tablet 4  . metoCLOPramide (REGLAN) 5 MG tablet Take 5 mg by mouth 2 (two) times daily.    Marland Kitchen oxyCODONE-acetaminophen (PERCOCET/ROXICET) 5-325 MG tablet Take 1-2 tablets by mouth every 4 (four) hours as needed.     . potassium chloride (KLOR-CON 10) 10 MEQ tablet Take one tablet daily as directed    . promethazine (PHENERGAN) 12.5 MG tablet Take 1 tablet (12.5 mg total) by mouth every 4 (four) hours as needed for nausea or vomiting. 20 tablet 0  . riTUXimab (RITUXAN) 100 MG/10ML injection Inject into the vein. Every 4 months    . rOPINIRole (REQUIP) 0.5 MG tablet Take 1 mg by mouth at bedtime.     Marland Kitchen warfarin (COUMADIN) 5 MG tablet Take 1 1/2 tablets daily except 2 tablets on Tuesdays 60 tablet 6  . zolpidem (AMBIEN) 10 MG tablet Take 10 mg by mouth at bedtime as needed for sleep.     . magic mouthwash w/lidocaine SOLN Take 5 mLs by mouth 4 (four) times daily. (Patient not taking: Reported on 10/04/2016) 100 mL 0   No current facility-administered medications for this visit.     Allergies as of 10/04/2016 - Review Complete 10/04/2016  Allergen Reaction Noted  . Other  05/25/2016    Family History  Problem Relation Age of Onset  . Hypertension Mother   . Hyperlipidemia Mother   . Hypertension Father   . Colon cancer Neg Hx     Social History   Social History  . Marital status: Married    Spouse name: N/A  . Number of children: 2  . Years of education: N/A   Social History Main Topics  . Smoking status: Former Smoker    Packs/day: 1.00    Years: 15.00    Types: Cigarettes    Quit date: 02/19/2006  . Smokeless tobacco: Never Used     Comment: Quit x 10 years  . Alcohol use 0.0 oz/week     Comment: Occasional  . Drug use: No   . Sexual activity: Not Currently    Birth control/ protection: Surgical     Comment: tubal and ablation   Other Topics Concern  . None   Social History Narrative  . None    Review of Systems: As mentioned in HPI   Physical Exam: BP 106/75   Pulse 66   Temp 98.9 F (37.2 C) (Oral)   Ht 5' 4"  (1.626 m)   Wt 156 lb (70.8 kg)   BMI 26.78 kg/m  General:   Alert and oriented. No distress noted.  Pleasant and cooperative.  Head:  Normocephalic and atraumatic. Eyes:  Conjuctiva clear without scleral icterus. Mouth:  Oral mucosa pink and moist. Good dentition. No lesions. Abdomen:  +BS, soft, non-tender and non-distended. No rebound or guarding. No HSM or masses noted. Msk:  Symmetrical without gross deformities. Normal posture. Extremities:  Without edema. Neurologic:  Alert and  oriented x4 Psych:  Alert and cooperative. Normal mood and affect.  Lab Results  Component Value Date   ALT 32 05/28/2016   AST 56 (H) 05/28/2016   ALKPHOS 174 (H) 05/28/2016   BILITOT 0.5 05/28/2016

## 2016-10-04 NOTE — Assessment & Plan Note (Signed)
Bump in transaminases noted in 2015 in setting of methotrexate, with mild fluctuations intermittently throughout the years. Elevated alk phos and AST during hospitalization in April 2018 but in the setting of acute illness. 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 I do note that she has hepatomegaly on CT imaging. No laboratory indices to indicate underlying advanced liver disease and INR would not be reliable as she is on chronic Coumadin. Negative viral markers historically. Due to loose ends and reports of elevated LFTs historically and CT imaging, will proceed with further serologies now. I would like to pursue an ultrasound with elastography at some point. Will see first results of serologies.

## 2016-10-04 NOTE — Assessment & Plan Note (Addendum)
46 year old female clinically at baseline on low dose Reglan, Zofran prn, Dexilant. Chronic bloating noted. Occasional break-through GERD. Provided low FODMAP diet and discussed eating small meals 5-6 times per day. No vomiting. Weight loss noted in setting of recent prolonged hospitalization for appendicitis. Add Zantac in evenings. Return in 3 months.

## 2016-10-04 NOTE — Progress Notes (Signed)
CC'D TO PCP °

## 2016-10-04 NOTE — Patient Instructions (Signed)
Continue Dexilant each morning, and I have added Zantac each evening at bedtime.   I refilled Reglan for you.   I am ordering labs for the history of fluctuating liver numbers.  I would like to see you in 3 months to see how things are going and wrap up loose ends!  I have included the low FODMAP diet. Follow the items on the "low" side, eating 5-6 small meals per day.

## 2016-10-08 DIAGNOSIS — Z79899 Other long term (current) drug therapy: Secondary | ICD-10-CM | POA: Diagnosis not present

## 2016-10-08 DIAGNOSIS — M329 Systemic lupus erythematosus, unspecified: Secondary | ICD-10-CM | POA: Diagnosis not present

## 2016-10-08 DIAGNOSIS — M0609 Rheumatoid arthritis without rheumatoid factor, multiple sites: Secondary | ICD-10-CM | POA: Diagnosis not present

## 2016-10-08 DIAGNOSIS — M2550 Pain in unspecified joint: Secondary | ICD-10-CM | POA: Diagnosis not present

## 2016-10-09 DIAGNOSIS — R7989 Other specified abnormal findings of blood chemistry: Secondary | ICD-10-CM | POA: Diagnosis not present

## 2016-10-10 LAB — COMPLETE METABOLIC PANEL WITH GFR
ALBUMIN: 4.8 g/dL (ref 3.6–5.1)
ALK PHOS: 98 U/L (ref 33–115)
ALT: 17 U/L (ref 6–29)
AST: 19 U/L (ref 10–35)
BILIRUBIN TOTAL: 0.4 mg/dL (ref 0.2–1.2)
BUN: 13 mg/dL (ref 7–25)
CO2: 23 mmol/L (ref 20–32)
CREATININE: 0.66 mg/dL (ref 0.50–1.10)
Calcium: 9.7 mg/dL (ref 8.6–10.2)
Chloride: 103 mmol/L (ref 98–110)
GFR, Est African American: >89 mL/min (ref >=60)
GFR, Est Non African American: >89 mL/min (ref >=60)
Glucose, Bld: 91 mg/dL (ref 65–99)
Potassium: 3.9 mmol/L (ref 3.5–5.3)
Sodium: 141 mmol/L (ref 135–146)
TOTAL PROTEIN: 7.9 g/dL (ref 6.1–8.1)

## 2016-10-10 LAB — IGG, IGA, IGM
IGA: 422 mg/dL (ref 81–463)
IGG (IMMUNOGLOBIN G), SERUM: 1321 mg/dL (ref 694–1618)
IgM, Serum: 25 mg/dL — ABNORMAL LOW (ref 48–271)

## 2016-10-10 LAB — FERRITIN: FERRITIN: 88 ng/mL (ref 10–232)

## 2016-10-10 LAB — IRON AND TIBC
%SAT: 14 % (ref 11–50)
Iron: 53 ug/dL (ref 40–190)
TIBC: 390 ug/dL (ref 250–450)
UIBC: 337 ug/dL

## 2016-10-10 LAB — ANTI-SMOOTH MUSCLE ANTIBODY, IGG: SMOOTH MUSCLE AB: 32 U — AB (ref <20)

## 2016-10-10 LAB — ANTI-NUCLEAR AB-TITER (ANA TITER)

## 2016-10-10 LAB — ANA: Anti Nuclear Antibody(ANA): POSITIVE — AB

## 2016-10-11 LAB — MITOCHONDRIAL ANTIBODIES: Mitochondrial M2 Ab, IgG: 35.5 Units — ABNORMAL HIGH (ref <=20.0)

## 2016-10-11 LAB — CERULOPLASMIN: Ceruloplasmin: 34 mg/dL (ref 18–53)

## 2016-10-12 LAB — ALPHA-1 ANTITRYPSIN PHENOTYPE: A1 ANTITRYPSIN: 157 mg/dL (ref 83–199)

## 2016-10-15 ENCOUNTER — Ambulatory Visit (INDEPENDENT_AMBULATORY_CARE_PROVIDER_SITE_OTHER): Payer: BLUE CROSS/BLUE SHIELD | Admitting: *Deleted

## 2016-10-15 DIAGNOSIS — Z5181 Encounter for therapeutic drug level monitoring: Secondary | ICD-10-CM

## 2016-10-15 DIAGNOSIS — Z952 Presence of prosthetic heart valve: Secondary | ICD-10-CM

## 2016-10-15 LAB — POCT INR: INR: 2.6

## 2016-10-17 ENCOUNTER — Ambulatory Visit (HOSPITAL_COMMUNITY): Payer: BLUE CROSS/BLUE SHIELD

## 2016-10-17 ENCOUNTER — Other Ambulatory Visit (HOSPITAL_COMMUNITY): Payer: BLUE CROSS/BLUE SHIELD

## 2016-10-23 DIAGNOSIS — Z6827 Body mass index (BMI) 27.0-27.9, adult: Secondary | ICD-10-CM | POA: Diagnosis not present

## 2016-10-23 DIAGNOSIS — D511 Vitamin B12 deficiency anemia due to selective vitamin B12 malabsorption with proteinuria: Secondary | ICD-10-CM | POA: Diagnosis not present

## 2016-10-23 DIAGNOSIS — E663 Overweight: Secondary | ICD-10-CM | POA: Diagnosis not present

## 2016-10-23 DIAGNOSIS — E538 Deficiency of other specified B group vitamins: Secondary | ICD-10-CM | POA: Diagnosis not present

## 2016-10-23 DIAGNOSIS — G894 Chronic pain syndrome: Secondary | ICD-10-CM | POA: Diagnosis not present

## 2016-10-29 NOTE — Progress Notes (Signed)
Please let her know that she has a positive ANA, ASMA, and AMA. I need to discuss with Dr. Gala Romney the utility of a liver biopsy vs empiric treatment. Her LFTs are completely normal.

## 2016-11-01 ENCOUNTER — Encounter: Payer: Self-pay | Admitting: Gastroenterology

## 2016-11-05 ENCOUNTER — Encounter (HOSPITAL_COMMUNITY): Payer: Self-pay

## 2016-11-05 ENCOUNTER — Other Ambulatory Visit (HOSPITAL_COMMUNITY): Payer: BLUE CROSS/BLUE SHIELD

## 2016-11-05 ENCOUNTER — Encounter (HOSPITAL_COMMUNITY): Payer: BLUE CROSS/BLUE SHIELD

## 2016-11-05 ENCOUNTER — Ambulatory Visit (HOSPITAL_COMMUNITY): Payer: BLUE CROSS/BLUE SHIELD

## 2016-11-05 ENCOUNTER — Encounter (HOSPITAL_COMMUNITY): Payer: BLUE CROSS/BLUE SHIELD | Attending: Oncology | Admitting: Oncology

## 2016-11-05 VITALS — BP 114/72 | HR 65 | Resp 16 | Ht 64.0 in | Wt 158.9 lb

## 2016-11-05 DIAGNOSIS — Z79899 Other long term (current) drug therapy: Secondary | ICD-10-CM | POA: Diagnosis not present

## 2016-11-05 DIAGNOSIS — Z7901 Long term (current) use of anticoagulants: Secondary | ICD-10-CM | POA: Insufficient documentation

## 2016-11-05 DIAGNOSIS — C50912 Malignant neoplasm of unspecified site of left female breast: Secondary | ICD-10-CM | POA: Diagnosis not present

## 2016-11-05 DIAGNOSIS — M797 Fibromyalgia: Secondary | ICD-10-CM | POA: Insufficient documentation

## 2016-11-05 DIAGNOSIS — G629 Polyneuropathy, unspecified: Secondary | ICD-10-CM | POA: Diagnosis not present

## 2016-11-05 DIAGNOSIS — D649 Anemia, unspecified: Secondary | ICD-10-CM | POA: Insufficient documentation

## 2016-11-05 DIAGNOSIS — M329 Systemic lupus erythematosus, unspecified: Secondary | ICD-10-CM

## 2016-11-05 DIAGNOSIS — Z9049 Acquired absence of other specified parts of digestive tract: Secondary | ICD-10-CM | POA: Diagnosis not present

## 2016-11-05 DIAGNOSIS — Z87891 Personal history of nicotine dependence: Secondary | ICD-10-CM | POA: Insufficient documentation

## 2016-11-05 DIAGNOSIS — E538 Deficiency of other specified B group vitamins: Secondary | ICD-10-CM | POA: Insufficient documentation

## 2016-11-05 DIAGNOSIS — Z9221 Personal history of antineoplastic chemotherapy: Secondary | ICD-10-CM | POA: Diagnosis not present

## 2016-11-05 DIAGNOSIS — D591 Autoimmune hemolytic anemia, unspecified: Secondary | ICD-10-CM

## 2016-11-05 DIAGNOSIS — I7300 Raynaud's syndrome without gangrene: Secondary | ICD-10-CM | POA: Insufficient documentation

## 2016-11-05 DIAGNOSIS — Z9012 Acquired absence of left breast and nipple: Secondary | ICD-10-CM | POA: Diagnosis not present

## 2016-11-05 DIAGNOSIS — K3184 Gastroparesis: Secondary | ICD-10-CM | POA: Insufficient documentation

## 2016-11-05 DIAGNOSIS — F419 Anxiety disorder, unspecified: Secondary | ICD-10-CM | POA: Insufficient documentation

## 2016-11-05 DIAGNOSIS — Z853 Personal history of malignant neoplasm of breast: Secondary | ICD-10-CM | POA: Insufficient documentation

## 2016-11-05 DIAGNOSIS — C50419 Malignant neoplasm of upper-outer quadrant of unspecified female breast: Secondary | ICD-10-CM

## 2016-11-05 LAB — RETICULOCYTES
RBC.: 4.2 MIL/uL (ref 3.87–5.11)
RETIC CT PCT: 2 % (ref 0.4–3.1)
Retic Count, Absolute: 84 10*3/uL (ref 19.0–186.0)

## 2016-11-05 LAB — CBC WITH DIFFERENTIAL/PLATELET
Basophils Absolute: 0.1 10*3/uL (ref 0.0–0.1)
Basophils Relative: 1 %
EOS ABS: 0.2 10*3/uL (ref 0.0–0.7)
EOS PCT: 3 %
HCT: 37.4 % (ref 36.0–46.0)
HEMOGLOBIN: 12.8 g/dL (ref 12.0–15.0)
LYMPHS ABS: 1.1 10*3/uL (ref 0.7–4.0)
Lymphocytes Relative: 15 %
MCH: 30.5 pg (ref 26.0–34.0)
MCHC: 34.2 g/dL (ref 30.0–36.0)
MCV: 89 fL (ref 78.0–100.0)
MONOS PCT: 7 %
Monocytes Absolute: 0.5 10*3/uL (ref 0.1–1.0)
Neutro Abs: 5.3 10*3/uL (ref 1.7–7.7)
Neutrophils Relative %: 74 %
Platelets: 224 10*3/uL (ref 150–400)
RBC: 4.2 MIL/uL (ref 3.87–5.11)
RDW: 14.5 % (ref 11.5–15.5)
WBC: 7.1 10*3/uL (ref 4.0–10.5)

## 2016-11-05 LAB — COMPREHENSIVE METABOLIC PANEL
ALT: 21 U/L (ref 14–54)
AST: 24 U/L (ref 15–41)
Albumin: 4.3 g/dL (ref 3.5–5.0)
Alkaline Phosphatase: 91 U/L (ref 38–126)
Anion gap: 11 (ref 5–15)
BUN: 13 mg/dL (ref 6–20)
CO2: 28 mmol/L (ref 22–32)
Calcium: 9.4 mg/dL (ref 8.9–10.3)
Chloride: 98 mmol/L — ABNORMAL LOW (ref 101–111)
Creatinine, Ser: 0.66 mg/dL (ref 0.44–1.00)
GFR calc Af Amer: >60 mL/min (ref >60)
GFR calc non Af Amer: >60 mL/min (ref >60)
Glucose, Bld: 99 mg/dL (ref 65–99)
Potassium: 3.9 mmol/L (ref 3.5–5.1)
Sodium: 137 mmol/L (ref 135–145)
Total Bilirubin: 0.7 mg/dL (ref 0.3–1.2)
Total Protein: 8 g/dL (ref 6.5–8.1)

## 2016-11-05 LAB — LACTATE DEHYDROGENASE: LDH: 283 U/L — ABNORMAL HIGH (ref 98–192)

## 2016-11-05 NOTE — Progress Notes (Signed)
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  Triple negative with Ki-67 of 92%.      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   INTERVAL HISTORY:  [name] 46 y.o. female presents for routine follow-up for breast cancer and hemolytic anemia, thought to be secondary to lupus.   Patient states that overall she has been doing well. She has not palpated any masses in her bilateral breasts. She currently is taking right toxin IV every 4 months for treatment of her lupus states that her lupus is currently controlled. She does have chronic joint pains due to her lupus. She denies any chest pain, shortness breath, abdominal pain, focal weakness. Her last right breast mammogram was in February 2017 and it was negative for malignancy.  REVIEW OF SYSTEMS:  Review of  Systems  Constitutional: Negative for chills, fatigue and fever.  HENT:  Negative.   Eyes: Negative.   Respiratory: Negative.  Negative for cough and shortness of breath.   Cardiovascular: Negative.  Negative for chest pain and palpitations.  Gastrointestinal: Negative.  Negative for constipation, diarrhea, nausea and vomiting.  Endocrine: Negative.   Genitourinary: Negative.  Negative for dysuria, hematuria and vaginal bleeding (h/o ablation ).   Musculoskeletal: Negative for back pain and neck pain.       Joint pains  Skin: Negative.   Neurological: Negative for dizziness and headaches.  Hematological: Negative.   Psychiatric/Behavioral: Negative for sleep disturbance. The patient is not nervous/anxious.      PAST MEDICAL/SURGICAL HISTORY:  Past Medical History:  Diagnosis Date  . Abnormal Papanicolaou smear of cervix with positive human papilloma virus (HPV) test 08/17/2015  . Anticoagulation goal of INR 2.5 to 3.5   . Anxiety   . B12 deficiency   . BRCA1 negative   . BRCA2 negative   . Breast cancer ([place] [facility]) 2011   Left side  . Bursitis of right knee    Septic bursitis  . Drug-induced hepatitis    States per her rheumatologist, transaminases elevated but normalized after drug removed for   . Fibromyalgia   . 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 History of abnormal cervical Pap smear 08/12/2015  . Mitral valve disease, rheumatic    St. Jude prosthesis  . Peripheral neuropathy   .  Pneumonia   . Raynaud disease   . Raynaud disease   . SLE (systemic lupus erythematosus) ([place])    Past Surgical History:  Procedure Laterality Date  . APPENDECTOMY N/A 05/23/2016   Procedure: OPEN APPENDECTOMY WITH DRAINAGE OF ABSCESS;  Surgeon: Fanny Skates, MD;  Location: [facility];  Service: General;  Laterality: N/A;  . BREAST SURGERY    . CHOLECYSTECTOMY    . ENDOMETRIAL ABLATION  2008   She no longer has menses  . ESOPHAGOGASTRODUODENOSCOPY N/A 03/25/2013     Dr. Raliegh Scarlet reflux esophagitis-likely source of patient's symptoms (patulous EG junction). Hiatal hernia otherwise normal  . Insertion expander left breast  11/16/10  . MASTECTOMY    . MASTOPEXY  02/14/2011   Procedure: MASTOPEXY;  Surgeon: Macon Large;  Location: [facility];  Service: Plastics;  Laterality: Bilateral;  Right Breast Reduction   . MITRAL VALVE REPLACEMENT    . TISSUE EXPANDER PLACEMENT  02/14/2011   Procedure: TISSUE EXPANDER;  Surgeon: Macon Large;  Location: [facility];  Service: Plastics;  Laterality: Bilateral;  Left Breast Remove Tissue Expander Placement of Implant Breast Reconstruction  . TUBAL LIGATION       SOCIAL HISTORY:  Social History   Social History  . Marital status: Married    Spouse name: N/A  . Number of children: 2  . Years of education: N/A   Occupational History  . Not on file.   Social History Main Topics  . Smoking status: Former Smoker    Packs/day: 1.00    Years: 15.00    Types: Cigarettes    Quit date: 02/19/2006  . Smokeless tobacco: Never Used     Comment: Quit x 10 years  . Alcohol use 0.0 oz/week     Comment: Occasional  . Drug use: No  . Sexual activity: Not Currently    Birth control/ protection: Surgical     Comment: tubal and ablation   Other Topics Concern  . Not on file   Social History Narrative  . No narrative on file    FAMILY HISTORY:  Family History  Problem Relation Age of Onset  . Hypertension Mother   . Hyperlipidemia Mother   . Hypertension Father   . Colon cancer Neg Hx     CURRENT MEDICATIONS:  Outpatient Encounter Prescriptions as of 11/05/2016  Medication Sig Note  . Albuterol (VENTOLIN IN) Inhale into the lungs as needed.   . ALPRAZolam (XANAX) 0.5 MG tablet Take 0.5 mg by mouth at bedtime.  11/23/2013: Received from: External Pharmacy  . ANORO ELLIPTA 62.5-25 MCG/INH AEPB Inhale 1 puff into the lungs daily.    . cyanocobalamin (,VITAMIN B-12,) 1000 MCG/ML injection Inject 1 mL (1,000 mcg  total) into the muscle every 30 (thirty) days.   Marland Kitchen DEXILANT 60 MG capsule TAKE ONE CAPSULE BY MOUTH DAILY.   . furosemide (LASIX) 40 MG tablet TAKE ONE TABLET BY MOUTH ONCE DAILY.   Marland Kitchen gabapentin (NEURONTIN) 600 MG tablet Take 600 mg by mouth 2 (two) times daily.    Marland Kitchen HYDROcodone-acetaminophen (NORCO/VICODIN) 5-325 MG tablet Take 1 tablet by mouth every 4 (four) hours as needed.   Marland Kitchen ibuprofen (ADVIL,MOTRIN) 800 MG tablet Take 800 mg by mouth every 6 (six) hours as needed. Reported on 08/30/2015 08/12/2015: Received from: [facility]  . levothyroxine (SYNTHROID, LEVOTHROID) 100 MCG tablet Take 1 tablet (100 mcg total) by mouth daily.   . metoCLOPramide (REGLAN) 5 MG tablet Take 1 tablet (5 mg total) by mouth 2 (two)  times daily.   Marland Kitchen oxyCODONE-acetaminophen (PERCOCET/ROXICET) 5-325 MG tablet Take 1-2 tablets by mouth every 4 (four) hours as needed.    . potassium chloride (KLOR-CON 10) 10 MEQ tablet Take one tablet daily as directed 11/23/2013: Received from: [facility]  . promethazine (PHENERGAN) 12.5 MG tablet Take 1 tablet (12.5 mg total) by mouth every 4 (four) hours as needed for nausea or vomiting.   . ranitidine (ZANTAC) 150 MG tablet Take 1 tablet (150 mg total) by mouth at bedtime.   . riTUXimab (RITUXAN) 100 MG/10ML injection Inject into the vein. Every 4 months 04/20/2015: Last dose 11/2014 at [facility]  . rOPINIRole (REQUIP) 0.5 MG tablet Take 1 mg by mouth at bedtime.    Marland Kitchen warfarin (COUMADIN) 5 MG tablet Take 1 1/2 tablets daily except 2 tablets on Tuesdays   . zolpidem (AMBIEN) 10 MG tablet Take 10 mg by mouth at bedtime as needed for sleep.     No facility-administered encounter medications on file as of 11/05/2016.     ALLERGIES:  Allergies  Allergen Reactions  . 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 kph     PHYSICAL EXAM:  ECOG Performance status: 1 - Symptomatic, but independent.    Vitals:   11/05/16 0959  BP: 114/72  Pulse: 65  Resp: 16  SpO2: 98%   Filed Weights   11/05/16 0959  Weight: 158 lb 14.4 oz (72.1 kg)    Physical Exam  Constitutional: She is oriented to person, place, and time and well-developed, well-nourished, and in no distress.  HENT:  Head: Normocephalic.  Mouth/Throat: Oropharynx is clear and moist. No oropharyngeal exudate.  Eyes: Pupils are equal, round, and reactive to light. Conjunctivae are normal. No scleral icterus.  Neck: Normal range of motion. Neck supple.  Cardiovascular: Normal rate and regular rhythm.   Prosthetic heart valve noted on exam  Pulmonary/Chest: Effort normal and breath sounds normal. No respiratory distress. She has no wheezes.    Abdominal: Soft. Bowel sounds are normal. There is no tenderness.  Musculoskeletal: Normal range of motion. She exhibits no edema.  Lymphadenopathy:    She has no cervical adenopathy.       Right: No supraclavicular adenopathy present.       Left: No supraclavicular adenopathy present.  Neurological: She is alert and oriented to person, place, and time. No cranial nerve deficit. Gait normal.  Skin: Skin is warm and dry.  Psychiatric: Mood, memory, affect and judgment normal.  Nursing note and vitals reviewed.    LABORATORY DATA:  I have reviewed the labs as listed.  CBC    Component Value Date/Time   WBC 7.1 11/05/2016 0927   RBC 4.20 11/05/2016 0927   RBC 4.20 11/05/2016 0927   HGB 12.8 11/05/2016 0927   HCT 37.4 11/05/2016 0927   PLT 224 11/05/2016 0927   MCV 89.0 11/05/2016 0927   MCH 30.5 11/05/2016 0927   MCHC 34.2 11/05/2016 0927   RDW 14.5 11/05/2016 0927   LYMPHSABS 1.1 11/05/2016 0927   MONOABS 0.5 11/05/2016 0927   EOSABS 0.2 11/05/2016 0927   BASOSABS 0.1 11/05/2016 0927   CMP Latest Ref Rng & Units 11/05/2016 10/09/2016 05/30/2016  Glucose 65 - 99 mg/dL 99 91 94  BUN 6 - 20 mg/dL 13 13 <5(L)  Creatinine 0.44 - 1.00 mg/dL 0.66 0.66 0.66  Sodium 135 -  145 mmol/L 137  141 139  Potassium 3.5 - 5.1 mmol/L 3.9 3.9 3.3(L)  Chloride 101 - 111 mmol/L 98(L) 103 103  CO2 22 - 32 mmol/L 28 23 29   Calcium 8.9 - 10.3 mg/dL 9.4 9.7 8.0(L)  Total Protein 6.5 - 8.1 g/dL 8.0 7.9 -  Total Bilirubin 0.3 - 1.2 mg/dL 0.7 0.4 -  Alkaline Phos 38 - 126 U/L 91 98 -  AST 15 - 41 U/L 24 19 -  ALT 14 - 54 U/L 21 17 -     Results for MAHALIA, DYKES (MRN [id number])   Ref. Range 04/18/2016 12:35  [facility] Latest Ref Range: 98 - 192 U/L 298 (H)    PENDING LABS:  Reticulocyte count     DIAGNOSTIC IMAGING:  Right breast mammogram: 03/23/15    PATHOLOGY:  Left mastectomy surgical path: 04/20/10     ASSESSMENT & PLAN:   Stage IIA left breast invasive ductal carcinoma, ER-/PR-/HER2-: -s/p neoadjuvant chemotherapy with FEC x 2 cycles, followed by left mastectomy, then adjuvant TC chemotherapy x 4 cycles; completed chemotherapy on 08/28/10.  -s/p left breast reconstruction and right breast mammoplasty x 2; latest procedure done in 12/2015.  -Next right breast screening mammogram in 03/2017.  Clinical breast exam benign today.  -From a breast cancer standpoint, we could go to annual visits, but given her periodic anemia we will continue to see her every 6 months.   Hemolytic anemia:  -Anemia thought to be secondary to lupus.  -Hemoglobin 12.8 today. Her CBC was reviewed with her in detail. -Return to cancer center in 6 months with labs.    Dispo:  -Return to cancer center in 6 months for continued follow-up.    All questions were answered to patient's stated satisfaction. Encouraged patient to call with any new concerns or questions before her next visit to the cancer center and we can certain see her sooner, if needed.     Twana First, MD

## 2016-11-08 ENCOUNTER — Telehealth: Payer: Self-pay | Admitting: Internal Medicine

## 2016-11-08 DIAGNOSIS — R942 Abnormal results of pulmonary function studies: Secondary | ICD-10-CM | POA: Diagnosis not present

## 2016-11-08 DIAGNOSIS — M329 Systemic lupus erythematosus, unspecified: Secondary | ICD-10-CM | POA: Diagnosis not present

## 2016-11-08 DIAGNOSIS — G609 Hereditary and idiopathic neuropathy, unspecified: Secondary | ICD-10-CM | POA: Diagnosis not present

## 2016-11-08 DIAGNOSIS — R0602 Shortness of breath: Secondary | ICD-10-CM | POA: Diagnosis not present

## 2016-11-08 NOTE — Telephone Encounter (Signed)
Pt is aware. She said it was ok to set up liver bx. Please schedule.

## 2016-11-08 NOTE — Telephone Encounter (Signed)
Routing to AB.  

## 2016-11-08 NOTE — Telephone Encounter (Signed)
Pt said she saw AB back in August and had blood work done then and she was going to get with RMR to see what the next step would be. Patient said that she hasn't heard from [place] and was just checking to see what RMR recommended. Please call patient back at [phone]

## 2016-11-08 NOTE — Telephone Encounter (Signed)
I'm sorry. The weather closure last week put me a bit behind.   I think it would be in best interest, especially with her abnormal autoimmune serologies and enlarged liver, to complete a liver biopsy. She is on chronic anticoagulation, so this would be best done transjugular. Please see if she is agreeable to this.

## 2016-11-12 ENCOUNTER — Ambulatory Visit (INDEPENDENT_AMBULATORY_CARE_PROVIDER_SITE_OTHER): Payer: BLUE CROSS/BLUE SHIELD | Admitting: *Deleted

## 2016-11-12 ENCOUNTER — Other Ambulatory Visit: Payer: Self-pay

## 2016-11-12 DIAGNOSIS — Z5181 Encounter for therapeutic drug level monitoring: Secondary | ICD-10-CM

## 2016-11-12 DIAGNOSIS — R160 Hepatomegaly, not elsewhere classified: Secondary | ICD-10-CM

## 2016-11-12 DIAGNOSIS — Z952 Presence of prosthetic heart valve: Secondary | ICD-10-CM | POA: Diagnosis not present

## 2016-11-12 LAB — POCT INR: INR: 4.5

## 2016-11-12 NOTE — Telephone Encounter (Signed)
Orders have been put in waiting on scheduling to set it up.

## 2016-11-13 NOTE — Telephone Encounter (Signed)
[place] is set up for 11/28/16 @ 1:00 pm

## 2016-11-16 ENCOUNTER — Ambulatory Visit (HOSPITAL_COMMUNITY): Payer: BLUE CROSS/BLUE SHIELD

## 2016-11-21 ENCOUNTER — Ambulatory Visit (INDEPENDENT_AMBULATORY_CARE_PROVIDER_SITE_OTHER): Payer: BLUE CROSS/BLUE SHIELD | Admitting: *Deleted

## 2016-11-21 ENCOUNTER — Ambulatory Visit (HOSPITAL_COMMUNITY)
Admission: RE | Admit: 2016-11-21 | Discharge: 2016-11-21 | Disposition: A | Discharge status: Home / Self Care | Payer: BLUE CROSS/BLUE SHIELD | Source: Ambulatory Visit | Attending: Pulmonary Disease | Admitting: Pulmonary Disease

## 2016-11-21 DIAGNOSIS — R0602 Shortness of breath: Secondary | ICD-10-CM | POA: Insufficient documentation

## 2016-11-21 DIAGNOSIS — Z952 Presence of prosthetic heart valve: Secondary | ICD-10-CM

## 2016-11-21 DIAGNOSIS — Z1389 Encounter for screening for other disorder: Secondary | ICD-10-CM | POA: Diagnosis not present

## 2016-11-21 DIAGNOSIS — D6859 Other primary thrombophilia: Secondary | ICD-10-CM

## 2016-11-21 DIAGNOSIS — Z87891 Personal history of nicotine dependence: Secondary | ICD-10-CM | POA: Insufficient documentation

## 2016-11-21 DIAGNOSIS — Z5181 Encounter for therapeutic drug level monitoring: Secondary | ICD-10-CM | POA: Diagnosis not present

## 2016-11-21 DIAGNOSIS — G894 Chronic pain syndrome: Secondary | ICD-10-CM | POA: Diagnosis not present

## 2016-11-21 DIAGNOSIS — R942 Abnormal results of pulmonary function studies: Secondary | ICD-10-CM | POA: Diagnosis not present

## 2016-11-21 DIAGNOSIS — Z6827 Body mass index (BMI) 27.0-27.9, adult: Secondary | ICD-10-CM | POA: Diagnosis not present

## 2016-11-21 DIAGNOSIS — E663 Overweight: Secondary | ICD-10-CM | POA: Diagnosis not present

## 2016-11-21 LAB — PULMONARY FUNCTION TEST
DL/VA % PRED: 64 %
DL/VA: 3.1 ml/min/mmHg/L
DLCO UNC % PRED: 49 %
DLCO cor % pred: 50 %
DLCO cor: 12.17 ml/min/mmHg
DLCO unc: 11.94 ml/min/mmHg
FEF 25-75 PRE: 1.64 L/s
FEF 25-75 Post: 2.04 L/sec
FEF2575-%CHANGE-POST: 24 %
FEF2575-%PRED-POST: 72 %
FEF2575-%Pred-Pre: 58 %
FEV1-%CHANGE-POST: 6 %
FEV1-%Pred-Post: 76 %
FEV1-%Pred-Pre: 72 %
FEV1-Post: 2.18 L
FEV1-Pre: 2.05 L
FEV1FVC-%Change-Post: 1 %
FEV1FVC-%PRED-PRE: 90 %
FEV6-%CHANGE-POST: 4 %
FEV6-%PRED-POST: 83 %
FEV6-%Pred-Pre: 79 %
FEV6-POST: 2.91 L
FEV6-Pre: 2.77 L
FEV6FVC-%CHANGE-POST: 0 %
FEV6FVC-%PRED-POST: 102 %
FEV6FVC-%Pred-Pre: 101 %
FVC-%Change-Post: 4 %
FVC-%Pred-Post: 82 %
FVC-%Pred-Pre: 78 %
FVC-Post: 2.93 L
FVC-Pre: 2.8 L
POST FEV1/FVC RATIO: 74 %
PRE FEV1/FVC RATIO: 73 %
PRE FEV6/FVC RATIO: 99 %
Post FEV6/FVC ratio: 99 %
RV % PRED: 141 %
RV: 2.47 L
TLC % pred: 103 %
TLC: 5.23 L

## 2016-11-21 LAB — POCT INR: INR: 3

## 2016-11-21 MED ORDER — ALBUTEROL SULFATE (2.5 MG/3ML) 0.083% IN NEBU
2.5000 mg | INHALATION_SOLUTION | Freq: Once | RESPIRATORY_TRACT | Status: AC
  Administered 2016-11-21: 2.5 mg via RESPIRATORY_TRACT

## 2016-11-21 MED ORDER — ENOXAPARIN SODIUM 80 MG/0.8ML ~~LOC~~ SOLN: 80.0000 mg | Freq: Two times a day (BID) | SUBCUTANEOUS | 0 refills | Status: DC

## 2016-11-21 NOTE — Patient Instructions (Signed)
Pending Liver Bx on 11/28/16  Wt 72kg   Labs:  SCr 0.66  CrCl 118.48   Hgb 12.8  Hct 37.4  Platlets 224  10/4  Last dose of coumadin 10/5  No Lovenox or coumadin 10/6  Lovenox 80mg  SQ at 8am & 8pm 10/7  Lovenox 80mg  SQ at 8am & 8pm 10/8  Lovenox 80mg  SQ at 8am & 8pm 10/9  Lovenox 80mg  SQ at 8am only. No lovenox in pm 10/10 No lovenox in am ------procedure--------coumadin 10mg  pm 10/11  Lovenox 80mg  SQ at 8am & 8pm & coumadin 10mg  pm 10/12  Lovenox 80mg  SQ at 8am & 8pm & coumadin 10mg  pm 10/13  Lovenox 80mg  SQ at 8am & 8pm & coumadin 10mg  pm 10/14  Lovenox 80mg  SQ at 8am & 8pm & coumadin 10mg  pm 10/15  Lovenox 80mg  SQ at 8am ---------INR appt @ 10:30am

## 2016-11-22 ENCOUNTER — Ambulatory Visit (HOSPITAL_COMMUNITY)
Admission: RE | Admit: 2016-11-22 | Discharge: 2016-11-22 | Disposition: A | Discharge status: Home / Self Care | Payer: BLUE CROSS/BLUE SHIELD | Source: Ambulatory Visit | Attending: Adult Health | Admitting: Adult Health

## 2016-11-22 DIAGNOSIS — Z1231 Encounter for screening mammogram for malignant neoplasm of breast: Secondary | ICD-10-CM | POA: Diagnosis not present

## 2016-11-27 ENCOUNTER — Other Ambulatory Visit: Payer: Self-pay | Admitting: Student

## 2016-11-27 ENCOUNTER — Other Ambulatory Visit: Payer: Self-pay | Admitting: Radiology

## 2016-11-27 ENCOUNTER — Other Ambulatory Visit: Payer: Self-pay | Admitting: General Surgery

## 2016-11-28 ENCOUNTER — Encounter (HOSPITAL_COMMUNITY): Payer: Self-pay | Admitting: General Practice

## 2016-11-28 ENCOUNTER — Ambulatory Visit (HOSPITAL_COMMUNITY)
Admission: RE | Admit: 2016-11-28 | Discharge: 2016-11-28 | Disposition: A | Discharge status: Home / Self Care | Payer: BLUE CROSS/BLUE SHIELD | Source: Ambulatory Visit | Attending: Interventional Radiology | Admitting: Interventional Radiology

## 2016-11-28 ENCOUNTER — Observation Stay (HOSPITAL_BASED_OUTPATIENT_CLINIC_OR_DEPARTMENT_OTHER)
Admission: EM | Admit: 2016-11-28 | Discharge: 2016-11-30 | Disposition: A | Discharge status: Home / Self Care | Payer: BLUE CROSS/BLUE SHIELD | Attending: Interventional Radiology | Admitting: Interventional Radiology

## 2016-11-28 DIAGNOSIS — M797 Fibromyalgia: Secondary | ICD-10-CM | POA: Insufficient documentation

## 2016-11-28 DIAGNOSIS — M329 Systemic lupus erythematosus, unspecified: Secondary | ICD-10-CM | POA: Diagnosis present

## 2016-11-28 DIAGNOSIS — K3184 Gastroparesis: Secondary | ICD-10-CM | POA: Insufficient documentation

## 2016-11-28 DIAGNOSIS — K754 Autoimmune hepatitis: Secondary | ICD-10-CM | POA: Insufficient documentation

## 2016-11-28 DIAGNOSIS — F419 Anxiety disorder, unspecified: Secondary | ICD-10-CM | POA: Insufficient documentation

## 2016-11-28 DIAGNOSIS — Z853 Personal history of malignant neoplasm of breast: Secondary | ICD-10-CM | POA: Insufficient documentation

## 2016-11-28 DIAGNOSIS — I7300 Raynaud's syndrome without gangrene: Secondary | ICD-10-CM | POA: Insufficient documentation

## 2016-11-28 DIAGNOSIS — E538 Deficiency of other specified B group vitamins: Secondary | ICD-10-CM | POA: Diagnosis not present

## 2016-11-28 DIAGNOSIS — Z5181 Encounter for therapeutic drug level monitoring: Secondary | ICD-10-CM

## 2016-11-28 DIAGNOSIS — G8918 Other acute postprocedural pain: Secondary | ICD-10-CM | POA: Diagnosis not present

## 2016-11-28 DIAGNOSIS — K760 Fatty (change of) liver, not elsewhere classified: Secondary | ICD-10-CM | POA: Diagnosis not present

## 2016-11-28 DIAGNOSIS — R160 Hepatomegaly, not elsewhere classified: Secondary | ICD-10-CM | POA: Diagnosis not present

## 2016-11-28 DIAGNOSIS — Z952 Presence of prosthetic heart valve: Secondary | ICD-10-CM | POA: Insufficient documentation

## 2016-11-28 DIAGNOSIS — K739 Chronic hepatitis, unspecified: Secondary | ICD-10-CM | POA: Insufficient documentation

## 2016-11-28 DIAGNOSIS — Z7901 Long term (current) use of anticoagulants: Secondary | ICD-10-CM | POA: Diagnosis not present

## 2016-11-28 DIAGNOSIS — R109 Unspecified abdominal pain: Secondary | ICD-10-CM | POA: Diagnosis not present

## 2016-11-28 DIAGNOSIS — G629 Polyneuropathy, unspecified: Secondary | ICD-10-CM | POA: Diagnosis not present

## 2016-11-28 DIAGNOSIS — Z8249 Family history of ischemic heart disease and other diseases of the circulatory system: Secondary | ICD-10-CM | POA: Diagnosis not present

## 2016-11-28 DIAGNOSIS — Z87891 Personal history of nicotine dependence: Secondary | ICD-10-CM | POA: Insufficient documentation

## 2016-11-28 DIAGNOSIS — I5032 Chronic diastolic (congestive) heart failure: Secondary | ICD-10-CM | POA: Insufficient documentation

## 2016-11-28 DIAGNOSIS — E039 Hypothyroidism, unspecified: Secondary | ICD-10-CM | POA: Insufficient documentation

## 2016-11-28 DIAGNOSIS — Z9189 Other specified personal risk factors, not elsewhere classified: Secondary | ICD-10-CM

## 2016-11-28 DIAGNOSIS — R945 Abnormal results of liver function studies: Secondary | ICD-10-CM | POA: Diagnosis not present

## 2016-11-28 HISTORY — DX: Low back pain: M54.5

## 2016-11-28 HISTORY — DX: Malignant neoplasm of unspecified site of left female breast: C50.912

## 2016-11-28 HISTORY — DX: Other chronic pain: G89.29

## 2016-11-28 HISTORY — PX: LIVER BIOPSY: SHX301

## 2016-11-28 HISTORY — DX: Hypothyroidism, unspecified: E03.9

## 2016-11-28 HISTORY — DX: Personal history of other medical treatment: Z92.89

## 2016-11-28 HISTORY — DX: Gastro-esophageal reflux disease without esophagitis: K21.9

## 2016-11-28 HISTORY — DX: Low back pain, unspecified: M54.50

## 2016-11-28 LAB — PROTIME-INR
INR: 0.99
PROTHROMBIN TIME: 13 s (ref 11.4–15.2)

## 2016-11-28 LAB — CBC
HEMATOCRIT: 36.6 % (ref 36.0–46.0)
HEMOGLOBIN: 12.1 g/dL (ref 12.0–15.0)
MCH: 29.9 pg (ref 26.0–34.0)
MCHC: 33.1 g/dL (ref 30.0–36.0)
MCV: 90.4 fL (ref 78.0–100.0)
Platelets: 212 10*3/uL (ref 150–400)
RBC: 4.05 MIL/uL (ref 3.87–5.11)
RDW: 13.9 % (ref 11.5–15.5)
WBC: 6.1 10*3/uL (ref 4.0–10.5)

## 2016-11-28 LAB — APTT: aPTT: 32 seconds (ref 24–36)

## 2016-11-28 MED ORDER — LIDOCAINE HCL (PF) 1 % IJ SOLN
INTRAMUSCULAR | Status: AC
  Filled 2016-11-28: qty 10

## 2016-11-28 MED ORDER — ZOLPIDEM TARTRATE 5 MG PO TABS: 5.0000 mg | ORAL_TABLET | Freq: Every evening | ORAL | Status: DC | PRN

## 2016-11-28 MED ORDER — OXYCODONE-ACETAMINOPHEN 5-325 MG PO TABS
ORAL_TABLET | ORAL | Status: AC
  Administered 2016-11-28: 1 via ORAL
  Filled 2016-11-28: qty 1

## 2016-11-28 MED ORDER — FENTANYL CITRATE (PF) 100 MCG/2ML IJ SOLN
INTRAMUSCULAR | Status: AC | PRN
  Administered 2016-11-28: 25 ug via INTRAVENOUS
  Administered 2016-11-28: 50 ug via INTRAVENOUS

## 2016-11-28 MED ORDER — GI COCKTAIL ~~LOC~~
30.0000 mL | Freq: Once | ORAL | Status: AC
  Administered 2016-11-28: 30 mL via ORAL
  Filled 2016-11-28: qty 30

## 2016-11-28 MED ORDER — ONDANSETRON HCL 4 MG/2ML IJ SOLN
4.0000 mg | Freq: Once | INTRAMUSCULAR | Status: AC
  Administered 2016-11-28: 4 mg via INTRAVENOUS
  Filled 2016-11-28: qty 2

## 2016-11-28 MED ORDER — SODIUM CHLORIDE 0.9 % IV SOLN
INTRAVENOUS | Status: DC
  Administered 2016-11-28: 14:00:00 via INTRAVENOUS

## 2016-11-28 MED ORDER — OXYCODONE-ACETAMINOPHEN 5-325 MG PO TABS
1.0000 | ORAL_TABLET | ORAL | Status: DC | PRN
  Administered 2016-11-28: 1 via ORAL

## 2016-11-28 MED ORDER — OXYCODONE-ACETAMINOPHEN 5-325 MG PO TABS: 2.0000 | ORAL_TABLET | ORAL | Status: DC | PRN

## 2016-11-28 MED ORDER — FENTANYL CITRATE (PF) 100 MCG/2ML IJ SOLN
INTRAMUSCULAR | Status: AC
  Filled 2016-11-28: qty 4

## 2016-11-28 MED ORDER — ZOLPIDEM TARTRATE 5 MG PO TABS
5.0000 mg | ORAL_TABLET | Freq: Every evening | ORAL | Status: DC | PRN
  Administered 2016-11-29 (×2): 5 mg via ORAL
  Filled 2016-11-28 (×2): qty 1

## 2016-11-28 MED ORDER — ONDANSETRON HCL 4 MG/2ML IJ SOLN
INTRAMUSCULAR | Status: AC
  Administered 2016-11-28: 4 mg via INTRAVENOUS
  Filled 2016-11-28: qty 2

## 2016-11-28 MED ORDER — MIDAZOLAM HCL 2 MG/2ML IJ SOLN
INTRAMUSCULAR | Status: AC
  Filled 2016-11-28: qty 4

## 2016-11-28 MED ORDER — OXYCODONE-ACETAMINOPHEN 5-325 MG PO TABS
2.0000 | ORAL_TABLET | ORAL | Status: DC | PRN
  Administered 2016-11-29: 2 via ORAL
  Filled 2016-11-28 (×2): qty 2

## 2016-11-28 MED ORDER — MIDAZOLAM HCL 2 MG/2ML IJ SOLN
INTRAMUSCULAR | Status: AC | PRN
Start: 2016-11-28 — End: 2016-11-28
  Administered 2016-11-28: 0.5 mg via INTRAVENOUS
  Administered 2016-11-28: 1 mg via INTRAVENOUS

## 2016-11-28 NOTE — Discharge Instructions (Signed)
Liver Biopsy, Care After °Refer to this sheet in the next few weeks. These instructions provide you with information on caring for yourself after your procedure. Your health care provider may also give you more specific instructions. Your treatment has been planned according to current medical practices, but problems sometimes occur. Call your health care provider if you have any problems or questions after your procedure. °What can I expect after the procedure? °After your procedure, it is typical to have the following: °· A small amount of discomfort in the area where the biopsy was done and in the right shoulder or shoulder blade. °· A small amount of bruising around the area where the biopsy was done and on the skin over the liver. °· Sleepiness and fatigue for the rest of the day. ° °Follow these instructions at home: °· Rest at home for 1-2 days or as directed by your health care provider. °· Have a friend or family member stay with you for at least 24 hours. °· Because of the medicines used during the procedure, you should not do the following things in the first 24 hours: °? Drive. °? Use machinery. °? Be responsible for the care of other people. °? Sign legal documents. °? Take a bath or shower. °· There are many different ways to close and cover an incision, including stitches, skin glue, and adhesive strips. Follow your health care provider's instructions on: °? Incision care. °? Bandage (dressing) changes and removal. °? Incision closure removal. °· Do not drink alcohol in the first week. °· Do not lift more than 5 pounds or play contact sports for 2 weeks after this test. °· Take medicines only as directed by your health care provider. Do not take medicine containing aspirin or non-steroidal anti-inflammatory medicines such as ibuprofen for 1 week after this test. °· It is your responsibility to get your test results. °Contact a health care provider if: °· You have increased bleeding from an incision  that results in more than a small spot of blood. °· You have redness, swelling, or increasing pain in any incisions. °· You notice a discharge or a bad smell coming from any of your incisions. °· You have a fever or chills. °Get help right away if: °· You develop swelling, bloating, or pain in your abdomen. °· You become dizzy or faint. °· You develop a rash. °· You are nauseous or vomit. °· You have difficulty breathing, feel short of breath, or feel faint. °· You develop chest pain. °· You have problems with your speech or vision. °· You have trouble balancing or moving your arms or legs. °This information is not intended to replace advice given to you by your health care provider. Make sure you discuss any questions you have with your health care provider. °Document Released: 08/25/2004 Document Revised: 07/14/2015 Document Reviewed: 04/03/2013 °Elsevier Interactive Patient Education © 2018 [employer] °Moderate Conscious Sedation, Adult, Care After °These instructions provide you with information about caring for yourself after your procedure. Your health care provider may also give you more specific instructions. Your treatment has been planned according to current medical practices, but problems sometimes occur. Call your health care provider if you have any problems or questions after your procedure. °What can I expect after the procedure? °After your procedure, it is common: °· To feel sleepy for several hours. °· To feel clumsy and have poor balance for several hours. °· To have poor judgment for several hours. °· To vomit if you eat   too soon. ° °Follow these instructions at home: °For at least 24 hours after the procedure: ° °· Do not: °? Participate in activities where you could fall or become injured. °? Drive. °? Use heavy machinery. °? Drink alcohol. °? Take sleeping pills or medicines that cause drowsiness. °? Make important decisions or sign legal documents. °? Take care of children on your  own. °· Rest. °Eating and drinking °· Follow the diet recommended by your health care provider. °· If you vomit: °? Drink water, juice, or soup when you can drink without vomiting. °? Make sure you have little or no nausea before eating solid foods. °General instructions °· Have a responsible adult stay with you until you are awake and alert. °· Take over-the-counter and prescription medicines only as told by your health care provider. °· If you smoke, do not smoke without supervision. °· Keep all follow-up visits as told by your health care provider. This is important. °Contact a health care provider if: °· You keep feeling nauseous or you keep vomiting. °· You feel light-headed. °· You develop a rash. °· You have a fever. °Get help right away if: °· You have trouble breathing. °This information is not intended to replace advice given to you by your health care provider. Make sure you discuss any questions you have with your health care provider. °Document Released: 11/26/2012 Document Revised: 07/11/2015 Document Reviewed: 05/28/2015 °Elsevier Interactive Patient Education © 2018 [employer] ° °

## 2016-11-28 NOTE — Progress Notes (Signed)
[name] complains of abdominal pain after her biopsy. It is epigastric and has improved over the course of 20 minutes. Her vital signs remain stable and her abdominal exam is benign. Initially she was given a GI cocktail which resulted in some improvement. She has persistent pain. CT AP will be performed stat.

## 2016-11-28 NOTE — Progress Notes (Signed)
CT AP shows trace blood around liver. Her abdominal pain has improved and she remains hemodynamically stable. Will admit for 23 hour obs and recheck CBC in AM.

## 2016-11-28 NOTE — H&P (Signed)
Chief Complaint: Patient was seen in consultation today for hepatomegaly  Referring Physician(s): Annitta Needs  Supervising Physician: Marybelle Killings  Patient Status: [name] - Out-pt  History of Present Illness: [name] is a 46 y.o. female with past medical history of mitral valve disease, peripheral neuropathy, raynaud disease, breast cancer, and lupus who is followed by gastroenterology for drug-induced hepatitis and hepatomegaly.   IR consulted for random liver biopsy.  Case reviewed and approved by Dr. Pascal Lux for percutaneous biopsy.   Patients presents for procedure today in her usual state of health.  She has been NPO.  She does take coumadin but has held x7 days.   Past Medical History:  Diagnosis Date  . Abnormal Papanicolaou smear of cervix with positive human papilloma virus (HPV) test 08/17/2015  . Anticoagulation goal of INR 2.5 to 3.5   . Anxiety   . B12 deficiency   . BRCA1 negative   . BRCA2 negative   . Breast cancer ([place]) 2011   Left side  . Bursitis of right knee    Septic bursitis  . Drug-induced hepatitis    States per her rheumatologist, transaminases elevated but normalized after drug removed for   . Fibromyalgia   . 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 History of abnormal cervical Pap smear 08/12/2015  . Mitral valve disease, rheumatic    St. Jude prosthesis  . Peripheral neuropathy   . Pneumonia   . Raynaud disease   . Raynaud disease   . SLE (systemic lupus erythematosus) ([place])     Past Surgical History:  Procedure Laterality Date  . APPENDECTOMY N/A 05/23/2016   Procedure: OPEN APPENDECTOMY WITH DRAINAGE OF ABSCESS;  Surgeon: Fanny Skates, MD;  Location: [facility];  Service: General;  Laterality: N/A;  . BREAST SURGERY    . CHOLECYSTECTOMY    . ENDOMETRIAL ABLATION  2008   She no longer has menses  . ESOPHAGOGASTRODUODENOSCOPY N/A 03/25/2013   Dr. Raliegh Scarlet reflux esophagitis-likely source of  patient's symptoms (patulous EG junction). Hiatal hernia otherwise normal  . Insertion expander left breast  11/16/10  . MASTECTOMY    . MASTOPEXY  02/14/2011   Procedure: MASTOPEXY;  Surgeon: Macon Large;  Location: [facility];  Service: Plastics;  Laterality: Bilateral;  Right Breast Reduction   . MITRAL VALVE REPLACEMENT    . TISSUE EXPANDER PLACEMENT  02/14/2011   Procedure: TISSUE EXPANDER;  Surgeon: Macon Large;  Location: [facility] [place];  Service: Plastics;  Laterality: Bilateral;  Left Breast Remove Tissue Expander Placement of Implant Breast Reconstruction  . TUBAL LIGATION      Allergies: Other  Medications: Prior to Admission medications   Medication Sig Start Date End Date Taking? Authorizing Provider  Albuterol (VENTOLIN IN) Inhale 2-3 puffs into the lungs daily as needed (sob).    Yes [provider]  ALPRAZolam Duanne Moron) 0.5 MG tablet Take 0.5 mg by mouth 3 (three) times daily as needed for anxiety or sleep.  11/02/13  Yes [provider]  ANORO ELLIPTA 62.5-25 MCG/INH AEPB Inhale 1 puff into the lungs daily.  05/15/16  Yes [provider]  cyanocobalamin (,VITAMIN B-12,) 1000 MCG/ML injection Inject 1 mL (1,000 mcg total) into the muscle every 30 (thirty) days. 12/19/10  Yes Everardo All, MD  DEXILANT 60 MG capsule TAKE ONE CAPSULE BY MOUTH DAILY. Patient taking differently: TAKE 60 MG BY MOUTH DAILY. 07/13/16  Yes Mahala Menghini, PA-C  enoxaparin (LOVENOX) 80 MG/0.8ML injection Inject 0.8  mLs (80 mg total) into the skin every 12 (twelve) hours. 11/24/16 12/05/16 Yes Satira Sark, MD  furosemide (LASIX) 40 MG tablet TAKE ONE TABLET BY MOUTH ONCE DAILY. Patient taking differently: TAKE 40 MG BY MOUTH ONCE DAILY. 02/07/16  Yes Satira Sark, MD  gabapentin (NEURONTIN) 600 MG tablet Take 600 mg by mouth 3 (three) times daily.    Yes [provider]  ibuprofen (ADVIL,MOTRIN) 800 MG tablet Take 800 mg by mouth every 6 (six) hours as needed for  headache. Reported on 08/30/2015   Yes [provider]  levothyroxine (SYNTHROID, LEVOTHROID) 100 MCG tablet Take 1 tablet (100 mcg total) by mouth daily. 06/27/16  Yes Nida, Marella Chimes, MD  metoCLOPramide (REGLAN) 5 MG tablet Take 1 tablet (5 mg total) by mouth 2 (two) times daily. 10/04/16  Yes Annitta Needs, NP  ondansetron (ZOFRAN) 4 MG tablet Take 4 mg by mouth every 8 (eight) hours as needed for nausea or vomiting.   Yes [provider]  oxyCODONE-acetaminophen (PERCOCET/ROXICET) 5-325 MG tablet Take 1-2 tablets by mouth every 4 (four) hours as needed for moderate pain or severe pain.  06/27/16  Yes [provider]  potassium chloride (KLOR-CON 10) 10 MEQ tablet 10 mEq. Take one tablet daily as directed 11/09/13  Yes [provider]  promethazine (PHENERGAN) 12.5 MG tablet Take 1 tablet (12.5 mg total) by mouth every 4 (four) hours as needed for nausea or vomiting. 05/30/16  Yes Rolm Bookbinder, MD  ranitidine (ZANTAC) 150 MG tablet Take 1 tablet (150 mg total) by mouth at bedtime. 10/04/16  Yes Annitta Needs, NP  riTUXimab (RITUXAN) 100 MG/10ML injection Inject into the vein. Every 4 months   Yes [provider]  rOPINIRole (REQUIP) 0.5 MG tablet Take 1 mg by mouth at bedtime.    Yes [provider]  warfarin (COUMADIN) 5 MG tablet Take 1 1/2 tablets daily except 2 tablets on Tuesdays 06/13/16  Yes Satira Sark, MD  zolpidem (AMBIEN) 10 MG tablet Take 10 mg by mouth at bedtime as needed for sleep.  12/19/10  Yes de Stanford Scotland, MD  HYDROcodone-acetaminophen (NORCO/VICODIN) 5-325 MG tablet Take 1 tablet by mouth every 4 (four) hours as needed. Patient not taking: Reported on 11/27/2016 08/15/16   [name], PA-C     Family History  Problem Relation Age of Onset  . Hypertension Mother   . Hyperlipidemia Mother   . Hypertension Father   . Colon cancer Neg Hx     Social History   Social History  . Marital status: Married     Spouse name: N/A  . Number of children: 2  . Years of education: N/A   Social History Main Topics  . Smoking status: Former Smoker    Packs/day: 1.00    Years: 15.00    Types: Cigarettes    Quit date: 02/19/2006  . Smokeless tobacco: Never Used     Comment: Quit x 10 years  . Alcohol use 0.0 oz/week     Comment: Occasional  . Drug use: No  . Sexual activity: Not Currently    Birth control/ protection: Surgical     Comment: tubal and ablation   Other Topics Concern  . Not on file   Social History Narrative  . No narrative on file    Review of Systems  Constitutional: Negative for fatigue and fever.  Respiratory: Negative for cough and shortness of breath.   Cardiovascular: Negative for chest pain.  Gastrointestinal: Negative for abdominal pain.  Psychiatric/Behavioral: Negative for behavioral problems and confusion.    Vital Signs: BP 105/74   Pulse 65   Temp 98.6 F (37 C) (Oral)   Ht _0  (1.626 m)   Wt 160 lb (72.6 kg)   SpO2 99%   BMI 27.46 kg/m   Physical Exam  Constitutional: She is oriented to person, place, and time. She appears well-developed.  Cardiovascular: Normal rate and regular rhythm.   Audible mitral valve replacement  Pulmonary/Chest: Effort normal and breath sounds normal. No respiratory distress.  Abdominal: Soft.  Neurological: She is alert and oriented to person, place, and time.  Skin: Skin is warm and dry.  Psychiatric: She has a normal mood and affect. Her behavior is normal. Judgment and thought content normal.  Nursing note and vitals reviewed.   Imaging: Mm Digital Screening Unilat R  Result Date: 11/23/2016 CLINICAL DATA:  Screening. EXAM: DIGITAL SCREENING UNILATERAL RIGHT MAMMOGRAM WITH CAD COMPARISON:  Previous exam(s). ACR Breast Density Category a: The breast tissue is almost entirely fatty. FINDINGS: The patient has had a left mastectomy. There are no findings suspicious for malignancy. Images were processed with CAD.  IMPRESSION: No mammographic evidence of malignancy. A result letter of this screening mammogram will be mailed directly to the patient. RECOMMENDATION: Screening mammogram in one year.  (Code:SM-R-28M) BI-RADS CATEGORY  1: Negative. Electronically Signed   By: Nolon Nations M.D.   On: 11/23/2016 08:12    Labs:  CBC:  Recent Labs  05/27/16 0520 05/29/16 0453 08/14/16 2232 11/05/16 0927  WBC 7.8 4.7 6.7 7.1  HGB 9.9* 9.1* 12.1 12.8  HCT 30.6* 28.1* 35.7* 37.4  PLT 267 252 177 224    COAGS:  Recent Labs  05/24/16 0420 05/24/16 1314 05/24/16 1554 05/25/16 0430  09/24/16 1025 10/15/16 0931 11/12/16 0851 11/21/16 1049  INR 1.44  --   --  2.35  < > 3.4 2.6 4.5 3.0  APTT 49* 73* 68* 90*  --   --   --   --   --   < > = values in this interval not displayed.  BMP:  Recent Labs  05/29/16 0453 05/30/16 0419 10/09/16 1118 11/05/16 0927  NA 140 139 141 137  K 2.9* 3.3* 3.9 3.9  CL 99* 103 103 98*  CO2 _1 GLUCOSE 86 94 91 99  BUN <5* <5* 13 13  CALCIUM 7.9* 8.0* 9.7 9.4  CREATININE 0.66 0.66 0.66 0.66  GFRNONAA >60 >60 >89 >60  GFRAA >60 >60 >89 >60    LIVER FUNCTION TESTS:  Recent Labs  05/23/16 0712 05/28/16 1026 10/09/16 1118 11/05/16 0927  BILITOT 1.3* 0.5 0.4 0.7  AST 25 56* 19 24  ALT 19 32 17 21  ALKPHOS 79 174* 98 91  PROT 5.6* 5.2* 7.9 8.0  ALBUMIN 2.4* 2.4* 4.8 4.3    TUMOR MARKERS: No results for input(s): AFPTM, CEA, CA199, CHROMGRNA in the last 8760 hours.  Assessment and Plan: Patient with past medical history of enlarged liver, drug-induced hepatitis, lupus presents with complaint of abnormal blood work.  IR consulted for random liver biopsy at the request of Roseanne Kaufman, NP. Case reviewed by Dr. Pascal Lux who approves patient for procedure.  Patient presents today in their usual state of health.  She has been NPO and has appropriately held her blood thinners.  Risks and benefits discussed with the patient including, but not  limited to bleeding, infection, damage to adjacent structures or  low yield requiring additional tests. All of the patient's questions were answered, patient is agreeable to proceed. Consent signed and in chart.   Thank you for this interesting consult.  I greatly enjoyed meeting PRESTINA RAIGOZA and look forward to participating in their care.  A copy of this report was sent to the requesting provider on this date.  Electronically Signed: Docia Barrier, PA 11/28/2016, 12:39 PM   I spent a total of  30 Minutes   in face to face in clinical consultation, greater than 50% of which was counseling/coordinating care for hepatomegaly, hepatitis

## 2016-11-28 NOTE — Sedation Documentation (Signed)
Patient is resting comfortably. 

## 2016-11-28 NOTE — H&P (Deleted)
  The note originally documented on this encounter has been moved the the encounter in which it belongs.  

## 2016-11-28 NOTE — Progress Notes (Signed)
Patient arrived to floor from procedure in Radiology. Report received from [place], [place]. Bandage site clean, dry, and intact. Paged MD Hoss to receive orders for patient. Awaiting response. Will continue to monitor patient.

## 2016-11-28 NOTE — Progress Notes (Signed)
Client c/o severe 10/10 epigastric pain and color pale and Dr Barbie Banner notified

## 2016-11-28 NOTE — Sedation Documentation (Signed)
Patient stated started to feel nauseated. Doctor paged.

## 2016-11-28 NOTE — Sedation Documentation (Signed)
Patient is resting comfortably. Pain at puncture site 8/10 achy.

## 2016-11-28 NOTE — Procedures (Signed)
Random Liver Core Bx 18 g times four EBL 0 Comp 0

## 2016-11-28 NOTE — Progress Notes (Signed)
Report called to Tara,RN and transferred via bed to [id number]

## 2016-11-28 NOTE — Progress Notes (Signed)
Dr Barbie Banner in and client states was diaphoretic

## 2016-11-29 ENCOUNTER — Other Ambulatory Visit: Payer: BLUE CROSS/BLUE SHIELD | Admitting: Adult Health

## 2016-11-29 ENCOUNTER — Encounter (HOSPITAL_COMMUNITY): Payer: Self-pay | Admitting: General Practice

## 2016-11-29 DIAGNOSIS — K739 Chronic hepatitis, unspecified: Secondary | ICD-10-CM | POA: Diagnosis not present

## 2016-11-29 DIAGNOSIS — G629 Polyneuropathy, unspecified: Secondary | ICD-10-CM | POA: Diagnosis not present

## 2016-11-29 DIAGNOSIS — I5032 Chronic diastolic (congestive) heart failure: Secondary | ICD-10-CM | POA: Diagnosis not present

## 2016-11-29 DIAGNOSIS — Z87891 Personal history of nicotine dependence: Secondary | ICD-10-CM | POA: Diagnosis not present

## 2016-11-29 DIAGNOSIS — F419 Anxiety disorder, unspecified: Secondary | ICD-10-CM | POA: Diagnosis not present

## 2016-11-29 DIAGNOSIS — Z7901 Long term (current) use of anticoagulants: Secondary | ICD-10-CM

## 2016-11-29 DIAGNOSIS — Z952 Presence of prosthetic heart valve: Secondary | ICD-10-CM

## 2016-11-29 DIAGNOSIS — M329 Systemic lupus erythematosus, unspecified: Secondary | ICD-10-CM | POA: Diagnosis not present

## 2016-11-29 DIAGNOSIS — K754 Autoimmune hepatitis: Secondary | ICD-10-CM | POA: Diagnosis not present

## 2016-11-29 DIAGNOSIS — Z5181 Encounter for therapeutic drug level monitoring: Secondary | ICD-10-CM | POA: Diagnosis not present

## 2016-11-29 DIAGNOSIS — K3184 Gastroparesis: Secondary | ICD-10-CM

## 2016-11-29 DIAGNOSIS — I7300 Raynaud's syndrome without gangrene: Secondary | ICD-10-CM | POA: Diagnosis not present

## 2016-11-29 DIAGNOSIS — M797 Fibromyalgia: Secondary | ICD-10-CM | POA: Diagnosis not present

## 2016-11-29 DIAGNOSIS — Z853 Personal history of malignant neoplasm of breast: Secondary | ICD-10-CM | POA: Diagnosis not present

## 2016-11-29 DIAGNOSIS — E538 Deficiency of other specified B group vitamins: Secondary | ICD-10-CM | POA: Diagnosis not present

## 2016-11-29 DIAGNOSIS — E039 Hypothyroidism, unspecified: Secondary | ICD-10-CM | POA: Diagnosis not present

## 2016-11-29 DIAGNOSIS — R160 Hepatomegaly, not elsewhere classified: Secondary | ICD-10-CM | POA: Diagnosis not present

## 2016-11-29 DIAGNOSIS — B199 Unspecified viral hepatitis without hepatic coma: Secondary | ICD-10-CM | POA: Diagnosis not present

## 2016-11-29 DIAGNOSIS — G8918 Other acute postprocedural pain: Secondary | ICD-10-CM | POA: Diagnosis not present

## 2016-11-29 LAB — CBC WITH DIFFERENTIAL/PLATELET
Basophils Absolute: 0 10*3/uL (ref 0.0–0.1)
Basophils Relative: 0 %
Eosinophils Absolute: 0.1 10*3/uL (ref 0.0–0.7)
Eosinophils Relative: 3 %
HEMATOCRIT: 36.7 % (ref 36.0–46.0)
HEMOGLOBIN: 12.1 g/dL (ref 12.0–15.0)
LYMPHS ABS: 0.7 10*3/uL (ref 0.7–4.0)
LYMPHS PCT: 15 %
MCH: 29.7 pg (ref 26.0–34.0)
MCHC: 33 g/dL (ref 30.0–36.0)
MCV: 90.2 fL (ref 78.0–100.0)
Monocytes Absolute: 0.3 10*3/uL (ref 0.1–1.0)
Monocytes Relative: 6 %
NEUTROS ABS: 3.5 10*3/uL (ref 1.7–7.7)
NEUTROS PCT: 76 %
Platelets: 180 10*3/uL (ref 150–400)
RBC: 4.07 MIL/uL (ref 3.87–5.11)
RDW: 14.2 % (ref 11.5–15.5)
WBC: 4.6 10*3/uL (ref 4.0–10.5)

## 2016-11-29 LAB — TYPE AND SCREEN
ABO/RH(D): O POS
Antibody Screen: NEGATIVE

## 2016-11-29 MED ORDER — WARFARIN - PHARMACIST DOSING INPATIENT: Freq: Every day | Status: DC

## 2016-11-29 MED ORDER — ONDANSETRON HCL 4 MG/2ML IJ SOLN
4.0000 mg | Freq: Four times a day (QID) | INTRAMUSCULAR | Status: DC | PRN
  Administered 2016-11-29 (×2): 4 mg via INTRAVENOUS
  Filled 2016-11-29 (×2): qty 2

## 2016-11-29 MED ORDER — GI COCKTAIL ~~LOC~~
30.0000 mL | Freq: Three times a day (TID) | ORAL | Status: DC | PRN
  Administered 2016-11-29 – 2016-11-30 (×2): 30 mL via ORAL
  Filled 2016-11-29 (×4): qty 30

## 2016-11-29 MED ORDER — GI COCKTAIL ~~LOC~~
30.0000 mL | Freq: Once | ORAL | Status: AC
  Administered 2016-11-29: 30 mL via ORAL
  Filled 2016-11-29: qty 30

## 2016-11-29 MED ORDER — WARFARIN SODIUM 5 MG PO TABS
10.0000 mg | ORAL_TABLET | Freq: Once | ORAL | Status: AC
  Administered 2016-11-29: 10 mg via ORAL
  Filled 2016-11-29: qty 2

## 2016-11-29 MED ORDER — PROMETHAZINE HCL 25 MG PO TABS
12.5000 mg | ORAL_TABLET | Freq: Four times a day (QID) | ORAL | Status: DC | PRN
  Administered 2016-11-29 – 2016-11-30 (×3): 12.5 mg via ORAL
  Filled 2016-11-29 (×3): qty 1

## 2016-11-29 MED ORDER — PANTOPRAZOLE SODIUM 40 MG PO TBEC
40.0000 mg | DELAYED_RELEASE_TABLET | Freq: Every day | ORAL | Status: DC
  Administered 2016-11-29 – 2016-11-30 (×2): 40 mg via ORAL
  Filled 2016-11-29 (×2): qty 1

## 2016-11-29 MED ORDER — ENOXAPARIN SODIUM 80 MG/0.8ML ~~LOC~~ SOLN
1.0000 mg/kg | Freq: Two times a day (BID) | SUBCUTANEOUS | Status: DC
  Administered 2016-11-29 – 2016-11-30 (×2): 75 mg via SUBCUTANEOUS
  Filled 2016-11-29 (×2): qty 0.8

## 2016-11-29 NOTE — Progress Notes (Signed)
ANTICOAGULATION CONSULT NOTE - Initial Consult  Pharmacy Consult for Coumadin Indication: mechanical mitral valve  Allergies  Allergen Reactions  . 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 kph    Patient Measurements: Height: _0  (162.6 cm) Weight: 160 lb (72.6 kg) IBW/kg (Calculated) : 54.7 Lovenox Dosing Weight: 73 kg  Vital Signs: Temp: 98.2 F (36.8 C) (10/11 1523) Temp Source: Oral (10/11 1523) BP: 110/62 (10/11 1523) Pulse Rate: 64 (10/11 1523)  Labs:  Recent Labs  11/28/16 1242 11/29/16 0854  HGB 12.1 12.1  HCT 36.6 36.7  PLT 212 180  APTT 32  --   LABPROT 13.0  --   INR 0.99  --     CrCl cannot be calculated (Patient's most recent lab result is older than the maximum 21 days allowed.).   Medical History: Past Medical History:  Diagnosis Date  . Abnormal Papanicolaou smear of cervix with positive human papilloma virus (HPV) test 08/17/2015  . Anticoagulation goal of INR 2.5 to 3.5   . Anxiety   . B12 deficiency   . BRCA1 negative   . BRCA2 negative   . Breast cancer, left breast ([place]) 2011   S/P mastectomy; chemo; "no radiation due to lupus"  . Bursitis of right knee    Septic bursitis  . CHF (congestive heart failure) ([place])   . Chronic lower back pain   . Drug-induced hepatitis    States per her rheumatologist, transaminases elevated but normalized after drug removed for   . Fibromyalgia   . GERD (gastroesophageal reflux disease)   . 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 History of abnormal cervical Pap smear 08/12/2015  . History of blood transfusion    "when I had my heart OR; maybe again when I was dealing w/breast cancer" (11/28/2016)  . Hypothyroidism   . Mitral valve disease, rheumatic    St. Jude prosthesis  . Peripheral neuropathy   . Pneumonia   . PONV (postoperative nausea and vomiting)   . Raynaud  disease   . SLE (systemic lupus erythematosus) ([facility])    Assessment:  46 yr old female with hx mechanical mitral valve on Coumadin prior to admission.  Coumadin held as of 11/22/16 and Lovenox bridge started, for liver biopsy on 11/28/16.   Last Lovenox dose 10/9 am.   Cleared to resume anticoagulation this afternoon.  Lovenox 1 mg/kg (75 mg) ordered per MD.  Home Lovenox dose was 80 mg = syringe size.     Home Coumadin regimen:  10 mg TTSat and 7.5 mg MWFSun.  Last outpatient INR 3.0 on 11/21/16.  Goal of Therapy:  INR 2.5-3.5 Monitor platelets by anticoagulation protocol: Yes   Plan:   Coumadin 10 mg x 1 tonight.  Lovenox 75 mg sq q12h per MD  Daily PT/INR.  Anticipate Coumadin 10 mg daily until INR back into therapeutic range.  Discussed with patient.  Arty Baumgartner,  Pager: [phone] 11/29/2016,6:16 PM

## 2016-11-29 NOTE — Consult Note (Addendum)
Patient Demographics:    [name], is a 46 y.o. female  MRN: [id number]   DOB - [birth year]  Admit Date - 11/28/2016  Outpatient Primary MD for the patient is Sharilyn Sites, MD   Assessment & Plan:    Active Problems:   History of mitral valve replacement with mechanical valve   Systemic lupus erythematosus ([place])   Long term current use of anticoagulant therapy   Anticoagulation goal of INR 2.5 to 3.5   Gastroparesis   History of breast cancer  CT Abd/Pelvis from 11/28/2016:- There is a trace amount of intraperitoneal hemorrhage along the right side of the liver related to the liver biopsy.   1)H/o Prosthetic Mitral Valve Replacement/Anticoagulation-  INR goal is 2.5 to 3.5,  Coumadin has been on hold for at least a week to allow for liver biopsy by interventional radiology, Liver Biopsy done on 11/28/16, CT abd/pelvis postprocedure with trace amount of intra-peritoneal hemorrhage (please see CT abdomen and pelvis reported above), as per interventional radiology team, okay to proceed with anticoagulation at this time. Cardiology consult requested,  Lovenox 24m/kg q 12 hrs bridge and Coumadin  Restarted (pharmacy to dose coumadin) , await cardiology input  2)Epigastric Pain/Emesis- improved with GI cocktail and Phenergan, protonix ordered,  patient has history of gastroparesis, Zofran not effective, okay to treat with Phenergan, CT abdomen and pelvis without acute findings, no fevers and no leukocytosis. Clinically no evidence for acute abdomen at this time. When necessary antiemetics and pain medications   3)H/o Drug-induced Hepatitis- status post liver biopsy by IR on 11/28/2016, pathology pending , interventional radiology is following  Thank you Dr HAnselm Pancoastfor this consult, hospitalist team will follow  concurrently with you  Awaiting cardiology input regarding anticoagulation   With History of - Reviewed by me  Past Medical History:  Diagnosis Date  . Abnormal Papanicolaou smear of cervix with positive human papilloma virus (HPV) test 08/17/2015  . Anticoagulation goal of INR 2.5 to 3.5   . Anxiety   . B12 deficiency   . BRCA1 negative   . BRCA2 negative   . Breast cancer, left breast ([place] 2011   S/P mastectomy; chemo; "no radiation due to lupus"  . Bursitis of right knee    Septic bursitis  . CHF (congestive heart failure) ([place]   . Chronic lower back pain   . Drug-induced hepatitis    States per her rheumatologist, transaminases elevated but normalized after drug removed for   . Fibromyalgia   . GERD (gastroesophageal reflux disease)   . 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 History of abnormal cervical Pap smear 08/12/2015  . History of blood transfusion    "when I had my heart OR; maybe again when I was dealing w/breast cancer" (11/28/2016)  . Hypothyroidism   . Mitral valve disease, rheumatic    St. Jude prosthesis  . Peripheral neuropathy   . Pneumonia   . PONV (  postoperative nausea and vomiting)   . Raynaud disease   . SLE (systemic lupus erythematosus) ([place])       Past Surgical History:  Procedure Laterality Date  . APPENDECTOMY N/A 05/23/2016   Procedure: OPEN APPENDECTOMY WITH DRAINAGE OF ABSCESS;  Surgeon: Fanny Skates, MD;  Location: [facility];  Service: General;  Laterality: N/A;  . BREAST BIOPSY Left 2012  . BREAST IMPLANT EXCHANGE Left 12/2015  . CARDIAC CATHETERIZATION  2008  . CARDIAC VALVE REPLACEMENT    . ENDOMETRIAL ABLATION  2008   She no longer has menses  . ESOPHAGOGASTRODUODENOSCOPY N/A 03/25/2013   Dr. Raliegh Scarlet reflux esophagitis-likely source of patient's symptoms (patulous EG junction). Hiatal hernia otherwise normal  . LAPAROSCOPIC CHOLECYSTECTOMY    . LIVER BIOPSY  11/28/2016   liver core/notes  11/28/2016  . MASTECTOMY Left 2012  . MASTOPEXY  02/14/2011   Procedure: MASTOPEXY;  Surgeon: Macon Large;  Location: [facility];  Service: Plastics;  Laterality: Bilateral;  Right Breast Reduction   . MITRAL VALVE REPLACEMENT  2008  . TISSUE EXPANDER PLACEMENT  02/14/2011   Procedure: TISSUE EXPANDER;  Surgeon: Macon Large;  Location: [facility];  Service: Plastics;  Laterality: Bilateral;  Left Breast Remove Tissue Expander Placement of Implant Breast Reconstruction  . TISSUE EXPANDER PLACEMENT Left 11/16/2010   breasst  . TUBAL LIGATION       No chief complaint on file.     HPI:    [name]  is a 46 y.o. female with past medical history relevant for history of left breast cancer status post prior mastectomy, history of lupus, gastroparesis, prior mitral valve replacement (St Jude's) and drug-induced hepatitis was admitted to interventional radiology service on 11/28/2016 due to postop abdominal pain after image guided liver biopsy on 11/28/2016.  Patient continues to have epigastric and right upper quadrant pain, associated with nausea and vomiting, no fever no chills, no leukocytosis  Emesis is without bile or blood,   CT abdomen and pelvis done on 11/28/2016 with trace amount of intraperitoneal hemorrhage post liver biopsy, interventional radiology disease this is not unusual.  No frank chest pains, no exertional symptoms, no pleuritic symptoms.  Patient's Coumadin has been on hold for over a week to allow for liver biopsy as noted above  Hospitalist consult requested by Dr. Anselm Pancoast (interventional radiologist) to help manage anticoagulation in a patient with mitral valve replacement history as well as to have her evaluated for epigastric discomfort  Patient's daughter is at bedside, questions answered    Review of systems:    In addition to the HPI above,   A full 12 point Review of 10 Systems was done, except as stated above, all other Review of 10 Systems were  negative.    Social History:  Reviewed by me    Social History  Substance Use Topics  . Smoking status: Former Smoker    Packs/day: 1.00    Years: 15.00    Types: Cigarettes    Quit date: 02/19/2006  . Smokeless tobacco: Never Used  . Alcohol use 0.0 oz/week     Comment: 11/28/2016 "couple drinks/yearZ"       Family History :  Reviewed by me    Family History  Problem Relation Age of Onset  . Hypertension Mother   . Hyperlipidemia Mother   . Hypertension Father   . Colon cancer Neg Hx    **   Home Medications:   Prior to Admission medications   Medication Sig Start Date End  Date Taking? Authorizing Provider  ALPRAZolam Duanne Moron) 0.5 MG tablet Take 0.5 mg by mouth 3 (three) times daily as needed for anxiety or sleep.  11/02/13  Yes [provider]  cyanocobalamin (,VITAMIN B-12,) 1000 MCG/ML injection Inject 1 mL (1,000 mcg total) into the muscle every 30 (thirty) days. 12/19/10  Yes Everardo All, MD  DEXILANT 60 MG capsule TAKE ONE CAPSULE BY MOUTH DAILY. Patient taking differently: TAKE 60 MG BY MOUTH DAILY. 07/13/16  Yes Mahala Menghini, PA-C  enoxaparin (LOVENOX) 80 MG/0.8ML injection Inject 0.8 mLs (80 mg total) into the skin every 12 (twelve) hours. 11/24/16 12/05/16 Yes Satira Sark, MD  furosemide (LASIX) 40 MG tablet TAKE ONE TABLET BY MOUTH ONCE DAILY. Patient taking differently: TAKE 40 MG BY MOUTH ONCE DAILY. 02/07/16  Yes Satira Sark, MD  gabapentin (NEURONTIN) 600 MG tablet Take 600 mg by mouth 3 (three) times daily.    Yes [provider]  ibuprofen (ADVIL,MOTRIN) 800 MG tablet Take 800 mg by mouth every 6 (six) hours as needed for headache. Reported on 08/30/2015   Yes [provider]  levothyroxine (SYNTHROID, LEVOTHROID) 100 MCG tablet Take 1 tablet (100 mcg total) by mouth daily. 06/27/16  Yes Nida, Marella Chimes, MD  metoCLOPramide (REGLAN) 5 MG tablet Take 1 tablet (5 mg total) by mouth 2 (two) times daily. 10/04/16   Yes Annitta Needs, NP  ondansetron (ZOFRAN) 4 MG tablet Take 4 mg by mouth every 8 (eight) hours as needed for nausea or vomiting.   Yes [provider]  oxyCODONE-acetaminophen (PERCOCET/ROXICET) 5-325 MG tablet Take 1-2 tablets by mouth every 4 (four) hours as needed for moderate pain or severe pain.  06/27/16  Yes [provider]  potassium chloride (KLOR-CON 10) 10 MEQ tablet Take 10 mEq by mouth daily. Take one tablet daily as directed 11/09/13  Yes [provider]  promethazine (PHENERGAN) 12.5 MG tablet Take 1 tablet (12.5 mg total) by mouth every 4 (four) hours as needed for nausea or vomiting. 05/30/16  Yes Rolm Bookbinder, MD  ranitidine (ZANTAC) 150 MG tablet Take 1 tablet (150 mg total) by mouth at bedtime. 10/04/16  Yes Annitta Needs, NP  riTUXimab (RITUXAN) 100 MG/10ML injection Inject into the vein. Every 4 months   Yes [provider]  rOPINIRole (REQUIP) 0.5 MG tablet Take 1 mg by mouth at bedtime.    Yes [provider]  warfarin (COUMADIN) 5 MG tablet Take 1 1/2 tablets daily except 2 tablets on Tuesdays Patient taking differently: Take 7.5-10 mg by mouth See admin instructions. 7.5m Mon, Wed, Fri, Sun - 169mTuCain SaupeSat 06/13/16  Yes McSatira SarkMD  zolpidem (AMBIEN) 10 MG tablet Take 10 mg by mouth at bedtime.  12/19/10  Yes de GeStanford ScotlandMD     Allergies:     Allergies  Allergen Reactions  . 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 kph     Physical Exam:   Vitals  Blood pressure 110/62, pulse 64, temperature 98.2 F (36.8 C), temperature source Oral, resp. rate 16, SpO2 98 %.  Physical Examination: General appearance - alert, well appearing, and in no distress  Mental status - alert, oriented to person, place, and time, Eyes - sclera anicteric Neck - supple, no JVD elevation , Chest - clear  to auscultation  bilaterally, symmetrical air movement,  Heart - S1 and S2 normal, Click from prosthetic mitral valve  Abdomen - soft,tender epigastric and right upper quadrant area without rebound or guarding, intact dressing from recent biopsy right upper quadrant ,  nondistended,  Neurological - screening mental status exam normal, neck supple without rigidity, cranial nerves II through XII intact, DTR's normal and symmetric Extremities - no pedal edema noted, intact peripheral pulses  Skin - warm, dry    Data Review:    CBC  Recent Labs Lab 11/28/16 1242 11/29/16 0854  WBC 6.1 4.6  HGB 12.1 12.1  HCT 36.6 36.7  PLT 212 180  MCV 90.4 90.2  MCH 29.9 29.7  MCHC 33.1 33.0  RDW 13.9 14.2  LYMPHSABS  --  0.7  MONOABS  --  0.3  EOSABS  --  0.1  BASOSABS  --  0.0   ------------------------------------------------------------------------------------------------------------------  Chemistries  No results for input(s): NA, K, CL, CO2, GLUCOSE, BUN, CREATININE, CALCIUM, MG, AST, ALT, ALKPHOS, BILITOT in the last 168 hours.  Invalid input(s): GFRCGP ------------------------------------------------------------------------------------------------------------------ CrCl cannot be calculated (Patient's most recent lab result is older than the maximum 21 days allowed.). ------------------------------------------------------------------------------------------------------------------ No results for input(s): TSH, T4TOTAL, T3FREE, THYROIDAB in the last 72 hours.  Invalid input(s): FREET3   Coagulation profile  Recent Labs Lab 11/28/16 1242  INR 0.99   ------------------------------------------------------------------------------------------------------------------- No results for input(s): DDIMER in the last 72 hours. -------------------------------------------------------------------------------------------------------------------  Cardiac Enzymes No results for input(s): CKMB, TROPONINI,  MYOGLOBIN in the last 168 hours.  Invalid input(s): CK ------------------------------------------------------------------------------------------------------------------ No results found for: BNP   ---------------------------------------------------------------------------------------------------------------  Urinalysis    Component Value Date/Time   COLORURINE YELLOW 05/25/2016 [place] 05/25/2016 1646   LABSPEC 1.019 05/25/2016 1646   PHURINE 5.0 05/25/2016 1646   GLUCOSEU NEGATIVE 05/25/2016 1646   HGBUR NEGATIVE 05/25/2016 1646   BILIRUBINUR NEGATIVE 05/25/2016 1646   KETONESUR NEGATIVE 05/25/2016 1646   PROTEINUR NEGATIVE 05/25/2016 1646   UROBILINOGEN 0.2 09/06/2010 1600   NITRITE NEGATIVE 05/25/2016 1646   LEUKOCYTESUR MODERATE (A) 05/25/2016 1646    ----------------------------------------------------------------------------------------------------------------   Imaging Results:    Ct Abdomen Pelvis Wo Contrast  Result Date: 11/28/2016 CLINICAL DATA:  Abdominal pain after liver biopsy EXAM: CT ABDOMEN AND PELVIS WITHOUT CONTRAST TECHNIQUE: Multidetector CT imaging of the abdomen and pelvis was performed following the standard protocol without IV contrast. COMPARISON:  05/21/2016 FINDINGS: Lower chest: Dependent atelectasis Hepatobiliary: There is a trace amount of intraperitoneal hemorrhage along the right side of the liver. Total volume is estimated at 2 cc. There is stable appearance of the liver. Postcholecystectomy. Pancreas: Unremarkable Spleen: Unremarkable Adrenals/Urinary Tract: Stable cyst in the upper pole of the left kidney. Kidneys are lobulated. Adrenal glands are within normal limits. Stomach/Bowel: No evidence of small-bowel obstruction. No obvious mass in the colon. Post appendectomy changes. Stomach is unremarkable. Vascular/Lymphatic: No abnormal retroperitoneal adenopathy. Advanced atherosclerotic calcifications of the aorta and iliac  arteries. Reproductive: Uterus and adnexa are unremarkable Other: No free-fluid in the pelvis. Musculoskeletal: No vertebral compression deformity. Degenerative disc disease in the upper lumbar spine. IMPRESSION: There is a trace amount of intraperitoneal hemorrhage along the right side of the liver related to the liver biopsy. Electronically Signed   By: Marybelle Killings M.D.   On: 11/28/2016 18:41   US Biopsy (liver)  Result Date: 11/28/2016 INDICATION: Abnormal liver function test EXAM: ULTRASOUND-GUIDED RANDOM LIVER CORE BIOPSY. MEDICATIONS: None. ANESTHESIA/SEDATION: Fentanyl 75 mcg IV; Versed 1.5 mg IV Moderate Sedation Time:  10 The patient was continuously  monitored during the procedure by the interventional radiology nurse under my direct supervision. FLUOROSCOPY TIME:  Fluoroscopy Time:  minutes  seconds ( mGy). COMPLICATIONS: None immediate. PROCEDURE: Informed written consent was obtained from the patient after a thorough discussion of the procedural risks, benefits and alternatives. All questions were addressed. Maximal Sterile Barrier Technique was utilized including caps, mask, sterile gowns, sterile gloves, sterile drape, hand hygiene and skin antiseptic. A timeout was performed prior to the initiation of the procedure. The right flank was prepped with ChloraPrep in a sterile fashion, and a sterile drape was applied covering the operative field. A sterile gown and sterile gloves were used for the procedure. Under sonographic guidance, an 17 gauge guide needle was advanced into the right lobe of the liver. Subsequently 4 18 gauge core biopsies were obtained. The guide needle was removed. Final imaging was performed. Patient tolerated the procedure well without complication. Vital sign monitoring by nursing staff during the procedure will continue as patient is in the special procedures unit for post procedure observation. FINDINGS: The images document guide needle placement within the right lobe of  the liver. Post biopsy images demonstrate no hemorrhage. IMPRESSION: Successful ultrasound-guided core biopsy of the right lobe of the liver. Electronically Signed   By: Marybelle Killings M.D.   On: 11/28/2016 16:19    Radiological Exams on Admission: Ct Abdomen Pelvis Wo Contrast  Result Date: 11/28/2016 CLINICAL DATA:  Abdominal pain after liver biopsy EXAM: CT ABDOMEN AND PELVIS WITHOUT CONTRAST TECHNIQUE: Multidetector CT imaging of the abdomen and pelvis was performed following the standard protocol without IV contrast. COMPARISON:  05/21/2016 FINDINGS: Lower chest: Dependent atelectasis Hepatobiliary: There is a trace amount of intraperitoneal hemorrhage along the right side of the liver. Total volume is estimated at 2 cc. There is stable appearance of the liver. Postcholecystectomy. Pancreas: Unremarkable Spleen: Unremarkable Adrenals/Urinary Tract: Stable cyst in the upper pole of the left kidney. Kidneys are lobulated. Adrenal glands are within normal limits. Stomach/Bowel: No evidence of small-bowel obstruction. No obvious mass in the colon. Post appendectomy changes. Stomach is unremarkable. Vascular/Lymphatic: No abnormal retroperitoneal adenopathy. Advanced atherosclerotic calcifications of the aorta and iliac arteries. Reproductive: Uterus and adnexa are unremarkable Other: No free-fluid in the pelvis. Musculoskeletal: No vertebral compression deformity. Degenerative disc disease in the upper lumbar spine. IMPRESSION: There is a trace amount of intraperitoneal hemorrhage along the right side of the liver related to the liver biopsy. Electronically Signed   By: Marybelle Killings M.D.   On: 11/28/2016 18:41   US Biopsy (liver)  Result Date: 11/28/2016 INDICATION: Abnormal liver function test EXAM: ULTRASOUND-GUIDED RANDOM LIVER CORE BIOPSY. MEDICATIONS: None. ANESTHESIA/SEDATION: Fentanyl 75 mcg IV; Versed 1.5 mg IV Moderate Sedation Time:  10 The patient was continuously monitored during the procedure  by the interventional radiology nurse under my direct supervision. FLUOROSCOPY TIME:  Fluoroscopy Time:  minutes  seconds ( mGy). COMPLICATIONS: None immediate. PROCEDURE: Informed written consent was obtained from the patient after a thorough discussion of the procedural risks, benefits and alternatives. All questions were addressed. Maximal Sterile Barrier Technique was utilized including caps, mask, sterile gowns, sterile gloves, sterile drape, hand hygiene and skin antiseptic. A timeout was performed prior to the initiation of the procedure. The right flank was prepped with ChloraPrep in a sterile fashion, and a sterile drape was applied covering the operative field. A sterile gown and sterile gloves were used for the procedure. Under sonographic guidance, an 17 gauge guide needle was advanced into the right lobe of  the liver. Subsequently 4 18 gauge core biopsies were obtained. The guide needle was removed. Final imaging was performed. Patient tolerated the procedure well without complication. Vital sign monitoring by nursing staff during the procedure will continue as patient is in the special procedures unit for post procedure observation. FINDINGS: The images document guide needle placement within the right lobe of the liver. Post biopsy images demonstrate no hemorrhage. IMPRESSION: Successful ultrasound-guided core biopsy of the right lobe of the liver. Electronically Signed   By: Marybelle Killings M.D.   On: 11/28/2016 16:19    DVT Prophylaxis -SCD  ,  AM Labs Ordered, also please review Full Orders  Family Communication: Admission, patients condition and plan of care including tests being ordered have been discussed with the patient and daughter who indicate understanding and agree with the plan   Code Status - Full Code  Likely DC to  home  Condition   stable Ruthann Angulo M.D on 11/29/2016 at 3:41 PM   Between 7am to 7pm - Pager - [phone] After 7pm go to [email or web address] - password  [email or web address]  [facility] - Office  [phone]  Voice Recognition Viviann Spare dictation system was used to create this note, attempts have been made to correct errors. Please contact the author with questions and/or clarifications.

## 2016-11-29 NOTE — Progress Notes (Signed)
Patient assessed this afternoon.   She is feeling much better after GI cocktail this AM.  Still sore which is expected, but this is currently well-controlled.  She has been able to eat and drink with tolerance this afternoon.   Hospitalist team on board due to new epigastric pain as well as for assistance with resuming anticoagulation which is greatly appreciated.  They have requested input from Cardiology.   Placed order for CBC tomorrow morning.  If patient abdominal pain improved, eating and drinking well, and has a plan for anticoagulation will work towards possible discharge.   Brynda Greathouse, MS RD PA-C 4:58 PM

## 2016-11-29 NOTE — Consult Note (Signed)
Cardiology Consultation:   Patient ID: [name]; [id number]; [birth year]   Admit date: 11/28/2016 Date of Consult: 11/29/2016  Primary Care Provider: Sharilyn Sites, MD Primary Cardiologist: Dr Domenic Polite 07/20/2016 Primary Electrophysiologist:  n/a   Patient Profile:   [name] is a 46 y.o. female with a hx of SLE on Rituxin, autoimmune hepatitis, RA and Raynaud's disease, rheumatic heart disease s/p mechanical MVR on chronic coumadin, history of HIT, hx of breast cancer s/p chemo/masectomy, chronic diastolic CHF, abnl LFTs, and GERD, who is being seen today for the evaluation of anticoagulation at the request of Dr Barbie Banner.  History of Present Illness:   [name] was seen 05/2016 preop for perforated appendix with abscess. IV argatroban used as a bridge since she has hx HIT. Pt did well. Dr Domenic Polite saw 07/2016 and pt doing well.   Pt with hx abnl LFTs. Because of her medical history, a liver bx was indicated. She held her coumadin and came in for the procedure on 11/28/2016. After the procedure, she developed epigastric pain, was pale and diaphoretic. A CT scan showed trace blood around her liver, pt admitted to obs.  Today, pt w/ ongoing epigastric pain, GI cocktail some help, +N&V. Not yet back on coumadin. Dr Anselm Pancoast w/ IR felt she could resume it today. IM consulted to manage her pain and multiple medical issues. She was seen by Dr Denton Brick who requested cards consult to decide on Lovenox or heparin bridge until coumadin levels therapeutic. Bx result not available yet.  Pt is doing well. She bridged with Lovenox prior to the bx and has had no problems with this. She has used Lovenox multiple times and had no problems.   Pt not SOB, when she has pain, it is up at the xyphoid process and is worse with breathing. She wonders if her GERD has flared up because of the bx.     Past Medical History:  Diagnosis Date  . 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 Anticoagulation goal of INR 2.5 to 3.5   . Anxiety   . B12 deficiency   . BRCA1 negative   . BRCA2 negative   . Breast cancer, left breast ([place]) 2011   S/P mastectomy; chemo; "no radiation due to lupus"  . Bursitis of right knee    Septic bursitis  . CHF (congestive heart failure) ([place])   . Chronic lower back pain   . Drug-induced hepatitis    States per her rheumatologist, transaminases elevated but normalized after drug removed for   . Fibromyalgia   . GERD (gastroesophageal reflux disease)   . 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 History of abnormal cervical Pap smear 08/12/2015  . History of blood transfusion    "when I had my heart OR; maybe again when I was dealing w/breast cancer" (11/28/2016)  . Hypothyroidism   . Mitral valve disease, rheumatic    St. Jude prosthesis  . Peripheral neuropathy   . Pneumonia   . PONV (postoperative nausea and vomiting)   . Raynaud disease   . SLE (systemic lupus erythematosus) ([place])     Past Surgical History:  Procedure Laterality Date  . APPENDECTOMY N/A 05/23/2016   Procedure: OPEN APPENDECTOMY WITH DRAINAGE OF ABSCESS;  Surgeon: Fanny Skates, MD;  Location: [facility];  Service: General;  Laterality: N/A;  . BREAST BIOPSY Left 2012  . BREAST IMPLANT EXCHANGE Left 12/2015  . CARDIAC CATHETERIZATION  2008  . CARDIAC VALVE REPLACEMENT    . ENDOMETRIAL ABLATION  2008   She no longer has menses  . ESOPHAGOGASTRODUODENOSCOPY N/A 03/25/2013   Dr. Raliegh Scarlet reflux esophagitis-likely source of patient's symptoms (patulous EG junction). Hiatal hernia otherwise normal  . LAPAROSCOPIC CHOLECYSTECTOMY    . LIVER BIOPSY  11/28/2016   liver core/notes 11/28/2016  . MASTECTOMY Left 2012  . MASTOPEXY  02/14/2011   Procedure: MASTOPEXY;  Surgeon: Macon Large;  Location: [facility];  Service: Plastics;  Laterality: Bilateral;  Right Breast Reduction   . MITRAL VALVE REPLACEMENT   2008  . TISSUE EXPANDER PLACEMENT  02/14/2011   Procedure: TISSUE EXPANDER;  Surgeon: Macon Large;  Location: [facility];  Service: Plastics;  Laterality: Bilateral;  Left Breast Remove Tissue Expander Placement of Implant Breast Reconstruction  . TISSUE EXPANDER PLACEMENT Left 11/16/2010   breasst  . TUBAL LIGATION       Inpatient Medications: Scheduled Meds: . enoxaparin (LOVENOX) injection  1 mg/kg Subcutaneous Q12H  . pantoprazole  40 mg Oral Daily   Continuous Infusions:  PRN Meds: gi cocktail, ondansetron (ZOFRAN) IV, oxyCODONE-acetaminophen, promethazine, zolpidem Prior to Admission medications   Medication Sig Start Date End Date Taking? Authorizing Provider  ALPRAZolam Duanne Moron) 0.5 MG tablet Take 0.5 mg by mouth 3 (three) times daily as needed for anxiety or sleep.  11/02/13  Yes [provider]  cyanocobalamin (,VITAMIN B-12,) 1000 MCG/ML injection Inject 1 mL (1,000 mcg total) into the muscle every 30 (thirty) days. 12/19/10  Yes Everardo All, MD  DEXILANT 60 MG capsule TAKE ONE CAPSULE BY MOUTH DAILY. Patient taking differently: TAKE 60 MG BY MOUTH DAILY. 07/13/16  Yes Mahala Menghini, PA-C  enoxaparin (LOVENOX) 80 MG/0.8ML injection Inject 0.8 mLs (80 mg total) into the skin every 12 (twelve) hours. 11/24/16 12/05/16 Yes Satira Sark, MD  furosemide (LASIX) 40 MG tablet TAKE ONE TABLET BY MOUTH ONCE DAILY. Patient taking differently: TAKE 40 MG BY MOUTH ONCE DAILY. 02/07/16  Yes Satira Sark, MD  gabapentin (NEURONTIN) 600 MG tablet Take 600 mg by mouth 3 (three) times daily.    Yes [provider]  ibuprofen (ADVIL,MOTRIN) 800 MG tablet Take 800 mg by mouth every 6 (six) hours as needed for headache. Reported on 08/30/2015   Yes [provider]  levothyroxine (SYNTHROID, LEVOTHROID) 100 MCG tablet Take 1 tablet (100 mcg total) by mouth daily. 06/27/16  Yes Nida, Marella Chimes, MD  metoCLOPramide (REGLAN) 5 MG tablet Take 1 tablet (5 mg  total) by mouth 2 (two) times daily. 10/04/16  Yes Annitta Needs, NP  ondansetron (ZOFRAN) 4 MG tablet Take 4 mg by mouth every 8 (eight) hours as needed for nausea or vomiting.   Yes [provider]  oxyCODONE-acetaminophen (PERCOCET/ROXICET) 5-325 MG tablet Take 1-2 tablets by mouth every 4 (four) hours as needed for moderate pain or severe pain.  06/27/16  Yes [provider]  potassium chloride (KLOR-CON 10) 10 MEQ tablet Take 10 mEq by mouth daily. Take one tablet daily as directed 11/09/13  Yes [provider]  promethazine (PHENERGAN) 12.5 MG tablet Take 1 tablet (12.5 mg total) by mouth every 4 (four) hours as needed for nausea or vomiting. 05/30/16  Yes Rolm Bookbinder, MD  ranitidine (ZANTAC) 150 MG tablet Take 1 tablet (150 mg total) by mouth at bedtime. 10/04/16  Yes Annitta Needs, NP  riTUXimab (RITUXAN) 100 MG/10ML injection Inject into the vein. Every 4 months   Yes [provider]  rOPINIRole (REQUIP) 0.5 MG tablet Take 1 mg by mouth at bedtime.    Yes [provider]  warfarin (COUMADIN) 5 MG tablet Take 1 1/2 tablets daily except 2 tablets on Tuesdays Patient taking differently: Take 7.5-10 mg by mouth See admin instructions. 7.35m Mon, Wed, Fri, Sun - 121mTuCain SaupeSat 06/13/16  Yes McSatira SarkMD  zolpidem (AMBIEN) 10 MG tablet Take 10 mg by mouth at bedtime.  12/19/10  Yes de GeStanford ScotlandMD    Allergies:    Allergies  Allergen Reactions  . 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 kph    Social History:   Social History   Social History  . Marital status: Married    Spouse name: N/A  . Number of children: 2  . Years of education: N/A   Occupational History  . Not on file.   Social History Main Topics  . Smoking status: Former Smoker    Packs/day: 1.00    Years: 15.00    Types: Cigarettes    Quit date: 02/19/2006    . Smokeless tobacco: Never Used  . Alcohol use 0.0 oz/week     Comment: 11/28/2016 "couple drinks/yearZ"  . Drug use: No  . Sexual activity: Not Currently    Birth control/ protection: Surgical     Comment: tubal and ablation   Other Topics Concern  . Not on file   Social History Narrative  . No narrative on file    Family History:   The patient's family history includes Hyperlipidemia in her mother; Hypertension in her father and mother. There is no history of Colon cancer. Pt indicated that her mother is alive. She indicated that her father is alive. She indicated that her sister is alive. She indicated that her brother is alive. She indicated that her maternal grandmother is deceased. She indicated that her maternal grandfather is deceased. She indicated that her paternal grandmother is deceased. She indicated that her paternal grandfather is deceased. She indicated that her daughter is alive. She indicated that her son is alive. She indicated that the status of her neg hx is unknown.    ROS:  Please see the history of present illness.  All other ROS reviewed and negative.      Physical Exam/Data:   Vitals:   11/28/16 2034 11/29/16 0437 11/29/16 1523  BP: 105/64 102/65 110/62  Pulse: 66 61 64  Resp: 16 16 16   Temp: 98.2 F (36.8 C) 97.7 F (36.5 C) 98.2 F (36.8 C)  TempSrc: Oral Oral Oral  SpO2: 98% 98% 98%    Intake/Output Summary (Last 24 hours) at 11/29/16 1652 Last data filed at 11/29/16 1500  Gross per 24 hour  Intake              120 ml  Output                0 ml  Net              120 ml    General:  Well nourished, well developed, in no acute distress HEENT: normal Lymph: no adenopathy Neck: no JVD Endocrine:  No thryomegaly Vascular: No carotid bruits; 4/4 extremity pulses   Cardiac:  normal S1, S2; RRR; ?soft diastolic murmur  Lungs:  clear to auscultation bilaterally, no wheezing, rhonchi or rales  Abd: soft,  nontender, no hepatomegaly  Ext: no  edema Musculoskeletal:  No deformities, BUE and BLE strength normal and equal Skin: warm and dry  Neuro:  CNs 2-12 intact, no focal abnormalities noted Psych:  Normal affect   EKG:  The EKG was personally reviewed and demonstrates:  From 05/2016, SR, no acute changes Telemetry:  Telemetry was personally reviewed and demonstrates:  SR  Relevant CV Studies:  Echo: 07/17/2016 - Left ventricle: The cavity size was normal. Wall thickness was   normal. Systolic function was normal. The estimated ejection   fraction was in the range of 55% to 60%. Wall motion was normal;   there were no regional wall motion abnormalities. Features are   consistent with a pseudonormal left ventricular filling pattern,   with concomitant abnormal relaxation and increased filling   pressure (grade 2 diastolic dysfunction). - Aortic valve: Mildly calcified annulus. Trileaflet. Valve area   (VTI): 1.93 cm^2. Valve area (Vmax): 1.72 cm^2. Valve area   (Vmean): 1.78 cm^2. - Mitral valve: A [employer]. Jude [employer] mechanical prosthesis was   present. There was trivial regurgitation. Mean gradient (D): 3 mm Hg. - Left atrium: The atrium was mildly to moderately dilated. - Right atrium: Central venous pressure (est): 3 mm Hg. - Tricuspid valve: There was trivial regurgitation. - Pulmonary arteries: PA peak pressure: 17 mm Hg (S). - Pericardium, extracardiac: A prominent pericardial fat pad was present Impressions: - Normal LV wall thickness with LVEF 55-60% and grade 2 diastolic   dysfunction. Mild to moderate left atrial enlargement. St. Jude   mechanical prosthesis in mitral position with normal gradient.   Trivial tricuspid regurgitation with PASP estimated 17 mmHg.   Laboratory Data:  Chemistry  CMP Latest Ref Rng & Units 11/05/2016 10/09/2016 05/30/2016  Glucose 65 - 99 mg/dL 99 91 94  BUN 6 - 20 mg/dL 13 13 <5(L)  Creatinine 0.44 - 1.00 mg/dL 0.66 0.66 0.66  Sodium 135 - 145 mmol/L 137 141 139  Potassium  3.5 - 5.1 mmol/L 3.9 3.9 3.3(L)  Chloride 101 - 111 mmol/L 98(L) 103 103  CO2 22 - 32 mmol/L 28 23 29   Calcium 8.9 - 10.3 mg/dL 9.4 9.7 8.0(L)  Total Protein 6.5 - 8.1 g/dL 8.0 7.9 -  Total Bilirubin 0.3 - 1.2 mg/dL 0.7 0.4 -  Alkaline Phos 38 - 126 U/L 91 98 -  AST 15 - 41 U/L 24 19 -  ALT 14 - 54 U/L 21 17 -    Hematology  Recent Labs Lab 11/28/16 1242 11/29/16 0854  WBC 6.1 4.6  RBC 4.05 4.07  HGB 12.1 12.1  HCT 36.6 36.7  MCV 90.4 90.2  MCH 29.9 29.7  MCHC 33.1 33.0  RDW 13.9 14.2  PLT 212 180    Radiology/Studies:  Ct Abdomen Pelvis Wo Contrast  Result Date: 11/28/2016 CLINICAL DATA:  Abdominal pain after liver biopsy EXAM: CT ABDOMEN AND PELVIS WITHOUT CONTRAST TECHNIQUE: Multidetector CT imaging of the abdomen and pelvis was performed following the standard protocol without IV contrast. COMPARISON:  05/21/2016 FINDINGS: Lower chest: Dependent atelectasis Hepatobiliary: There is a trace amount of intraperitoneal hemorrhage along the right side of the liver. Total volume is estimated at 2 cc. There is stable appearance of the liver. Postcholecystectomy. Pancreas: Unremarkable Spleen: Unremarkable Adrenals/Urinary Tract: Stable cyst in the upper pole of the left kidney. Kidneys are lobulated. Adrenal glands are within normal limits. Stomach/Bowel: No evidence of small-bowel obstruction. No obvious mass in the colon. Post appendectomy changes. Stomach is unremarkable. Vascular/Lymphatic:  No abnormal retroperitoneal adenopathy. Advanced atherosclerotic calcifications of the aorta and iliac arteries. Reproductive: Uterus and adnexa are unremarkable Other: No free-fluid in the pelvis. Musculoskeletal: No vertebral compression deformity. Degenerative disc disease in the upper lumbar spine. IMPRESSION: There is a trace amount of intraperitoneal hemorrhage along the right side of the liver related to the liver biopsy. Electronically Signed   By: Marybelle Killings M.D.   On: 11/28/2016 18:41     US Biopsy (liver)  Result Date: 11/28/2016 INDICATION: Abnormal liver function test EXAM: ULTRASOUND-GUIDED RANDOM LIVER CORE BIOPSY. MEDICATIONS: None. ANESTHESIA/SEDATION: Fentanyl 75 mcg IV; Versed 1.5 mg IV Moderate Sedation Time:  10 The patient was continuously monitored during the procedure by the interventional radiology nurse under my direct supervision. FLUOROSCOPY TIME:  Fluoroscopy Time:  minutes  seconds ( mGy). COMPLICATIONS: None immediate. PROCEDURE: Informed written consent was obtained from the patient after a thorough discussion of the procedural risks, benefits and alternatives. All questions were addressed. Maximal Sterile Barrier Technique was utilized including caps, mask, sterile gowns, sterile gloves, sterile drape, hand hygiene and skin antiseptic. A timeout was performed prior to the initiation of the procedure. The right flank was prepped with ChloraPrep in a sterile fashion, and a sterile drape was applied covering the operative field. A sterile gown and sterile gloves were used for the procedure. Under sonographic guidance, an 17 gauge guide needle was advanced into the right lobe of the liver. Subsequently 4 18 gauge core biopsies were obtained. The guide needle was removed. Final imaging was performed. Patient tolerated the procedure well without complication. Vital sign monitoring by nursing staff during the procedure will continue as patient is in the special procedures unit for post procedure observation. FINDINGS: The images document guide needle placement within the right lobe of the liver. Post biopsy images demonstrate no hemorrhage. IMPRESSION: Successful ultrasound-guided core biopsy of the right lobe of the liver. Electronically Signed   By: Marybelle Killings M.D.   On: 11/28/2016 16:19    Assessment and Plan:   Active Problems: 1.  History of mitral valve replacement with mechanical valve - reason for anticoagulation  2.  Anticoagulation goal of INR 2.5 to 3.5 -  IR has cleared her to resume anticoag - she has bridged w/ Lovenox successfully in the past, last time prior to this admission - start Lovenox and coumadin, watch overnight and d/c in am on Lovenox if does well and CBC unchanged.  Otherwise, per IM   Systemic lupus erythematosus ([place])   Long term current use of anticoagulant therapy   Gastroparesis   History of breast cancer     Signed, Lenoard Aden  11/29/2016 4:52 PM

## 2016-11-29 NOTE — Progress Notes (Signed)
Supervising Physician: Markus Daft  Patient Status:  [facility] - In-pt  Chief Complaint:  Hepatitis, hepatomegaly  Subjective: Patient with continued epigastric pain this AM.  Also with nausea.   Allergies: Other  Medications: Prior to Admission medications   Medication Sig Start Date End Date Taking? Authorizing Provider  Albuterol (VENTOLIN IN) Inhale 2-3 puffs into the lungs daily as needed (sob).     [provider]  ALPRAZolam Duanne Moron) 0.5 MG tablet Take 0.5 mg by mouth 3 (three) times daily as needed for anxiety or sleep.  11/02/13   [provider]  ANORO ELLIPTA 62.5-25 MCG/INH AEPB Inhale 1 puff into the lungs daily.  05/15/16   [provider]  cyanocobalamin (,VITAMIN B-12,) 1000 MCG/ML injection Inject 1 mL (1,000 mcg total) into the muscle every 30 (thirty) days. 12/19/10   Everardo All, MD  DEXILANT 60 MG capsule TAKE ONE CAPSULE BY MOUTH DAILY. Patient taking differently: TAKE 60 MG BY MOUTH DAILY. 07/13/16   Mahala Menghini, PA-C  enoxaparin (LOVENOX) 80 MG/0.8ML injection Inject 0.8 mLs (80 mg total) into the skin every 12 (twelve) hours. 11/24/16 12/05/16  Satira Sark, MD  furosemide (LASIX) 40 MG tablet TAKE ONE TABLET BY MOUTH ONCE DAILY. Patient taking differently: TAKE 40 MG BY MOUTH ONCE DAILY. 02/07/16   Satira Sark, MD  gabapentin (NEURONTIN) 600 MG tablet Take 600 mg by mouth 3 (three) times daily.     [provider]  ibuprofen (ADVIL,MOTRIN) 800 MG tablet Take 800 mg by mouth every 6 (six) hours as needed for headache. Reported on 08/30/2015    [provider]  levothyroxine (SYNTHROID, LEVOTHROID) 100 MCG tablet Take 1 tablet (100 mcg total) by mouth daily. 06/27/16   Cassandria Anger, MD  metoCLOPramide (REGLAN) 5 MG tablet Take 1 tablet (5 mg total) by mouth 2 (two) times daily. 10/04/16   Annitta Needs, NP  ondansetron (ZOFRAN) 4 MG tablet Take 4 mg by mouth every 8 (eight) hours as needed for nausea  or vomiting.    [provider]  oxyCODONE-acetaminophen (PERCOCET/ROXICET) 5-325 MG tablet Take 1-2 tablets by mouth every 4 (four) hours as needed for moderate pain or severe pain.  06/27/16   [provider]  potassium chloride (KLOR-CON 10) 10 MEQ tablet 10 mEq. Take one tablet daily as directed 11/09/13   [provider]  promethazine (PHENERGAN) 12.5 MG tablet Take 1 tablet (12.5 mg total) by mouth every 4 (four) hours as needed for nausea or vomiting. 05/30/16   Rolm Bookbinder, MD  ranitidine (ZANTAC) 150 MG tablet Take 1 tablet (150 mg total) by mouth at bedtime. 10/04/16   Annitta Needs, NP  riTUXimab (RITUXAN) 100 MG/10ML injection Inject into the vein. Every 4 months    [provider]  rOPINIRole (REQUIP) 0.5 MG tablet Take 1 mg by mouth at bedtime.     [provider]  warfarin (COUMADIN) 5 MG tablet Take 1 1/2 tablets daily except 2 tablets on Tuesdays 06/13/16   Satira Sark, MD  zolpidem (AMBIEN) 10 MG tablet Take 10 mg by mouth at bedtime as needed for sleep.  12/19/10   de Stanford Scotland, MD     Vital Signs: BP 102/65 (BP Location: Right Arm)   Pulse 61   Temp 97.7 F (36.5 C) (Oral)   Resp 16   SpO2 98%   Physical Exam  NAD, alert Abd: severe epigastric pain, general soreness across RUQ.  Puncture site  intact and without erythema or warmth.   Imaging: Ct Abdomen Pelvis Wo Contrast  Result Date: 11/28/2016 CLINICAL DATA:  Abdominal pain after liver biopsy EXAM: CT ABDOMEN AND PELVIS WITHOUT CONTRAST TECHNIQUE: Multidetector CT imaging of the abdomen and pelvis was performed following the standard protocol without IV contrast. COMPARISON:  05/21/2016 FINDINGS: Lower chest: Dependent atelectasis Hepatobiliary: There is a trace amount of intraperitoneal hemorrhage along the right side of the liver. Total volume is estimated at 2 cc. There is stable appearance of the liver. Postcholecystectomy. Pancreas: Unremarkable Spleen:  Unremarkable Adrenals/Urinary Tract: Stable cyst in the upper pole of the left kidney. Kidneys are lobulated. Adrenal glands are within normal limits. Stomach/Bowel: No evidence of small-bowel obstruction. No obvious mass in the colon. Post appendectomy changes. Stomach is unremarkable. Vascular/Lymphatic: No abnormal retroperitoneal adenopathy. Advanced atherosclerotic calcifications of the aorta and iliac arteries. Reproductive: Uterus and adnexa are unremarkable Other: No free-fluid in the pelvis. Musculoskeletal: No vertebral compression deformity. Degenerative disc disease in the upper lumbar spine. IMPRESSION: There is a trace amount of intraperitoneal hemorrhage along the right side of the liver related to the liver biopsy. Electronically Signed   By: Marybelle Killings M.D.   On: 11/28/2016 18:41   US Biopsy (liver)  Result Date: 11/28/2016 INDICATION: Abnormal liver function test EXAM: ULTRASOUND-GUIDED RANDOM LIVER CORE BIOPSY. MEDICATIONS: None. ANESTHESIA/SEDATION: Fentanyl 75 mcg IV; Versed 1.5 mg IV Moderate Sedation Time:  10 The patient was continuously monitored during the procedure by the interventional radiology nurse under my direct supervision. FLUOROSCOPY TIME:  Fluoroscopy Time:  minutes  seconds ( mGy). COMPLICATIONS: None immediate. PROCEDURE: Informed written consent was obtained from the patient after a thorough discussion of the procedural risks, benefits and alternatives. All questions were addressed. Maximal Sterile Barrier Technique was utilized including caps, mask, sterile gowns, sterile gloves, sterile drape, hand hygiene and skin antiseptic. A timeout was performed prior to the initiation of the procedure. The right flank was prepped with ChloraPrep in a sterile fashion, and a sterile drape was applied covering the operative field. A sterile gown and sterile gloves were used for the procedure. Under sonographic guidance, an 17 gauge guide needle was advanced into the right lobe of  the liver. Subsequently 4 18 gauge core biopsies were obtained. The guide needle was removed. Final imaging was performed. Patient tolerated the procedure well without complication. Vital sign monitoring by nursing staff during the procedure will continue as patient is in the special procedures unit for post procedure observation. FINDINGS: The images document guide needle placement within the right lobe of the liver. Post biopsy images demonstrate no hemorrhage. IMPRESSION: Successful ultrasound-guided core biopsy of the right lobe of the liver. Electronically Signed   By: Marybelle Killings M.D.   On: 11/28/2016 16:19    Labs:  CBC:  Recent Labs  08/14/16 2232 11/05/16 0927 11/28/16 1242 11/29/16 0854  WBC 6.7 7.1 6.1 4.6  HGB 12.1 12.8 12.1 12.1  HCT 35.7* 37.4 36.6 36.7  PLT 177 224 212 180    COAGS:  Recent Labs  05/24/16 1314 05/24/16 1554 05/25/16 0430  10/15/16 0931 11/12/16 0851 11/21/16 1049 11/28/16 1242  INR  --   --  2.35  < > 2.6 4.5 3.0 0.99  APTT 73* 68* 90*  --   --   --   --  32  < > = values in this interval not displayed.  BMP:  Recent Labs  05/29/16 0453 05/30/16 0419 10/09/16 1118 11/05/16 0927  NA 140 139  141 137  K 2.9* 3.3* 3.9 3.9  CL 99* 103 103 98*  CO2 30 29 23 28   GLUCOSE 86 94 91 99  BUN <5* <5* 13 13  CALCIUM 7.9* 8.0* 9.7 9.4  CREATININE 0.66 0.66 0.66 0.66  GFRNONAA >60 >60 >89 >60  GFRAA >60 >60 >89 >60    LIVER FUNCTION TESTS:  Recent Labs  05/23/16 0712 05/28/16 1026 10/09/16 1118 11/05/16 0927  BILITOT 1.3* 0.5 0.4 0.7  AST 25 56* 19 24  ALT 19 32 17 21  ALKPHOS 79 174* 98 91  PROT 5.6* 5.2* 7.9 8.0  ALBUMIN 2.4* 2.4* 4.8 4.3    Assessment and Plan: Epigastric pain s/p random liver biopsy 10/10 Patient admitted for observation overnight after liver biopsy with epigastric pain and CT Abdomen yesterday showed trace amount of hemorrhage.  Patient with ongoing epigastric pain this AM.  She reports some mild,  temporary improvement in pain with GI cocktail yesterday afternoon, but overall her pain has not improved.   She has not been able to tolerate her food this AM.  She had one episode of vomiting overnight.  Zofran has not alleviated her nausea.  She also has not been resumed on her anticoagulation.  Her lab work has been stable and hemoglobin remains unchanged.  Reviewed case with Dr. Anselm Pancoast who has also assessed patient this AM.  Patient is stable from biopsy procedure and can resume anticoagulation.  Will request assistance from hospitalist service given significant past medical history with multiple medical issues which may be causing her epigastric pain. They will also assist with resuming anticoagulation.  IR to follow.   Electronically Signed: Docia Barrier, PA 11/29/2016, 10:21 AM   I spent a total of 15 Minutes at the the patient's bedside AND on the patient's hospital floor or unit, greater than 50% of which was counseling/coordinating care for hepatitis, epigastric pain

## 2016-11-30 DIAGNOSIS — Z7901 Long term (current) use of anticoagulants: Secondary | ICD-10-CM | POA: Diagnosis not present

## 2016-11-30 DIAGNOSIS — E039 Hypothyroidism, unspecified: Secondary | ICD-10-CM | POA: Diagnosis not present

## 2016-11-30 DIAGNOSIS — M328 Other forms of systemic lupus erythematosus: Secondary | ICD-10-CM

## 2016-11-30 DIAGNOSIS — Z853 Personal history of malignant neoplasm of breast: Secondary | ICD-10-CM | POA: Diagnosis not present

## 2016-11-30 DIAGNOSIS — K2900 Acute gastritis without bleeding: Secondary | ICD-10-CM | POA: Diagnosis not present

## 2016-11-30 DIAGNOSIS — G629 Polyneuropathy, unspecified: Secondary | ICD-10-CM | POA: Diagnosis not present

## 2016-11-30 DIAGNOSIS — I5032 Chronic diastolic (congestive) heart failure: Secondary | ICD-10-CM | POA: Diagnosis not present

## 2016-11-30 DIAGNOSIS — K754 Autoimmune hepatitis: Secondary | ICD-10-CM | POA: Diagnosis not present

## 2016-11-30 DIAGNOSIS — R160 Hepatomegaly, not elsewhere classified: Secondary | ICD-10-CM | POA: Diagnosis not present

## 2016-11-30 DIAGNOSIS — E538 Deficiency of other specified B group vitamins: Secondary | ICD-10-CM | POA: Diagnosis not present

## 2016-11-30 DIAGNOSIS — G8918 Other acute postprocedural pain: Secondary | ICD-10-CM | POA: Diagnosis not present

## 2016-11-30 DIAGNOSIS — K739 Chronic hepatitis, unspecified: Secondary | ICD-10-CM | POA: Diagnosis not present

## 2016-11-30 DIAGNOSIS — F419 Anxiety disorder, unspecified: Secondary | ICD-10-CM | POA: Diagnosis not present

## 2016-11-30 DIAGNOSIS — I7300 Raynaud's syndrome without gangrene: Secondary | ICD-10-CM | POA: Diagnosis not present

## 2016-11-30 DIAGNOSIS — Z87891 Personal history of nicotine dependence: Secondary | ICD-10-CM | POA: Diagnosis not present

## 2016-11-30 DIAGNOSIS — M329 Systemic lupus erythematosus, unspecified: Secondary | ICD-10-CM | POA: Diagnosis not present

## 2016-11-30 DIAGNOSIS — M797 Fibromyalgia: Secondary | ICD-10-CM | POA: Diagnosis not present

## 2016-11-30 DIAGNOSIS — Z952 Presence of prosthetic heart valve: Secondary | ICD-10-CM | POA: Diagnosis not present

## 2016-11-30 DIAGNOSIS — K3184 Gastroparesis: Secondary | ICD-10-CM | POA: Diagnosis not present

## 2016-11-30 LAB — CBC WITH DIFFERENTIAL/PLATELET
BASOS PCT: 1 %
Basophils Absolute: 0 10*3/uL (ref 0.0–0.1)
EOS ABS: 0.2 10*3/uL (ref 0.0–0.7)
Eosinophils Relative: 4 %
HCT: 36.2 % (ref 36.0–46.0)
HEMOGLOBIN: 12 g/dL (ref 12.0–15.0)
Lymphocytes Relative: 19 %
Lymphs Abs: 0.8 10*3/uL (ref 0.7–4.0)
MCH: 29.9 pg (ref 26.0–34.0)
MCHC: 33.1 g/dL (ref 30.0–36.0)
MCV: 90 fL (ref 78.0–100.0)
MONOS PCT: 9 %
Monocytes Absolute: 0.4 10*3/uL (ref 0.1–1.0)
NEUTROS ABS: 3 10*3/uL (ref 1.7–7.7)
Neutrophils Relative %: 67 %
Platelets: 196 10*3/uL (ref 150–400)
RBC: 4.02 MIL/uL (ref 3.87–5.11)
RDW: 13.8 % (ref 11.5–15.5)
WBC: 4.4 10*3/uL (ref 4.0–10.5)

## 2016-11-30 LAB — PROTIME-INR
INR: 1.2
PROTHROMBIN TIME: 15.1 s (ref 11.4–15.2)

## 2016-11-30 MED ORDER — WARFARIN SODIUM 5 MG PO TABS: 7.5000 mg | ORAL_TABLET | Freq: Once | ORAL | Status: DC

## 2016-11-30 NOTE — Discharge Instructions (Signed)
-  Continue to monitor biopsy site for the next few days.  Check for redness or warmth.  Keep clean and dry through next 48 hrs.  -Keep appointment with Edrick Oh for coumadin/INR check.  -Continue to use Zantac at home and antacids as needed for indigestion. Do not eat within 2-4 hrs of bedtime.  -No follow-up needed with interventional radiology at this time. Biopsy results will be sent to Roseanne Kaufman, NP. You will hear from them with results.

## 2016-11-30 NOTE — Progress Notes (Signed)
ANTICOAGULATION CONSULT NOTE - Initial Consult  Pharmacy Consult for Coumadin Indication: mechanical mitral valve  Allergies  Allergen Reactions  . Methotrexate Derivatives   . 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 kph    Patient Measurements: Height: 5\' 4"  (162.6 cm) Weight: 160 lb (72.6 kg) IBW/kg (Calculated) : 54.7 Lovenox Dosing Weight: 73 kg  Assessment:  46 yr old female on Coumadin 7.5mg  daily exc for 10mg  on TTS PTA for mechanical valve, INR goal 2.5-3.5 per outpatient anticoag notes. Was holding for liver biopsy and was on Lovenox (last dose warfarin 10/4). Now s/p biopsy resuming Coumadin on 10/11. INR today is 1.2.  Goal of Therapy:  INR 2.5-3.5 Monitor platelets by anticoagulation protocol: Yes   Plan:  Continue Lovenox 75mg  [employer] Q12h Give Coumadin 7.5mg  PO x 1 tonight Monitor daily INR, CBC, s/s of bleed  Elenor Quinones, PharmD, [facility] Clinical Pharmacist Pager [phone] 11/30/2016 10:19 AM

## 2016-11-30 NOTE — Progress Notes (Addendum)
Dr. Denton Brick paged to follow up on anticoagulation therapy/orders. Informed that per Dr. Anselm Pancoast, patient 'stable from biopsy procedure and can resume anticoagulation.' Dr. Denton Brick verbalized he will place orders to resume anticoagulation therapy per pharmacy consult (Lovenox bridge). Cardiology consult pending. Pt updated on POC. Will continue to monitor.

## 2016-11-30 NOTE — Progress Notes (Addendum)
Zigmund Daniel, PA, notified pt reports ongoing nausea s/p PRN Zofran with emesis/vomiting. Also notified c/o 8/10 constant/sharp epigastric pain with inability to tolerate PRN pain meds d/t active N/V. PA states she will order PRN phenergan and one time dose of GI cocktail for epigastric pain. Pt updated. Will administer as directed & continue to monitor.

## 2016-11-30 NOTE — Progress Notes (Signed)
Discharge home. Home discharge instruction given, no question verbalized. 

## 2016-11-30 NOTE — Progress Notes (Signed)
PROGRESS NOTE    [name]  BMW:[id number] DOB: [birth year] DOA: 11/28/2016 PCP: Sharilyn Sites, MD    Brief Narrative:  46 year old female, admitted by the interventional radiology service due to postoperative abdominal pain after image guided liver biopsy. Patient is known to have history of breast cancer status post mastectomy, lupus, gastroparesis, mitral valve replacement, ([employer]), and drug-induced-autoimmune hepatitis. The pain was epigastric and right upper quadrant inlocation, associated with nausea and vomiting, no fevers or chills.Her blood pressure was 110/62, heart rate 64, temperature 98.2, oxygen saturation 96% with respirations 16. Patient was alert and oriented, no icterus, lungs were clear to auscultation bilaterally, heart S1-S2 present and rhythmic, positive mechanical click. The abdomen was tender at the epigastric and right upper quadrant, no rebound, no guarding or peritoneal signs, no lower extremity edema. Her white count was 6.1, hemoglobin 12.1, hematocrit 36.6, platelets 212. INR 0.99. CT of the abdomen with trace amount of intraperitoneal hemorrhage along the right side of the liver related to liver biopsy.  Medicine service consulted for abdominal pain.   Assessment & Plan:   Active Problems:   History of mitral valve replacement with mechanical valve   Systemic lupus erythematosus ([place])   Long term current use of anticoagulant therapy   Anticoagulation goal of INR 2.5 to 3.5   Gastroparesis   History of breast cancer   1. Abdominal pain likely due to gastritis. It has improved, after use of antiacids, will recommend to continue ranitidine and dexilant. Residual pain likely post liver biopsy. The abdomen is soft with no peritoneal signs, the hb and hct are stable with no signs of bleeding. Avoid ibuprofen for the next 2 weeks.   2. Mechanical mitral valve. Anticoagulation has been resumed, with warfarin and enoxaparin bridging. Follow INR as outpatient,  target 2,5 to 3,5.   3. Lupus. Clinically stable with no signs of exacerbation, continue immunosuppressive regimen with rituximab.   4. Diastolic heart failure, chronic and stable. Resume furosemide per home regimen.   5. Hypothyroid. Will continue levothyroxine.   Patient can be discharged home from the medical perspective, continue antiacid therapy and avoid nonsteroidal anti-inflammatory agents.    DVT prophylaxis: warfarin  Code Status: full Family Communication:  Disposition Plan: home   Consultants:     Procedures:     Antimicrobials:       Subjective: Patient feeling better, epigastric abdominal pain has improved, no nausea or vomiting, moderate to mild abdominal pain on the right side. No dyspnea, or chest pain.   Objective: Vitals:   11/29/16 1523 11/29/16 1800 11/29/16 2121 11/30/16 0600  BP: 110/62  110/60 107/70  Pulse: 64  64 (!) 58  Resp: 16  16 16   Temp: 98.2 F (36.8 C)  98.3 F (36.8 C) 98.2 F (36.8 C)  TempSrc: Oral  Oral Oral  SpO2: 98%  98% 98%  Weight:  72.6 kg (160 lb)    Height:  5\' 4"  (1.626 m)      Intake/Output Summary (Last 24 hours) at 11/30/16 1007 Last data filed at 11/29/16 2000  Gross per 24 hour  Intake              720 ml  Output                0 ml  Net              720 ml   Filed Weights   11/29/16 1800  Weight: 72.6 kg (160 lb)  Examination:  General: Not in pain or dyspnea Neurology: Awake and alert, non focal  E ENT: no pallor, no icterus, oral mucosa moist Cardiovascular: No JVD. S1-S2 present, rhythmic, no gallops, rubs, or murmurs. No lower extremity edema. Pulmonary: vesicular breath sounds bilaterally, adequate air movement, no wheezing, rhonchi or rales. Gastrointestinal. Abdomen flat, no organomegaly, mild tender to deep palpation at the right lower quadrant, no rebound or guarding Skin. No rashes Musculoskeletal: no joint deformities     Data Reviewed: I have personally reviewed following labs  and imaging studies  CBC:  Recent Labs Lab 11/28/16 1242 11/29/16 0854 11/30/16 0504  WBC 6.1 4.6 4.4  NEUTROABS  --  3.5 3.0  HGB 12.1 12.1 12.0  HCT 36.6 36.7 36.2  MCV 90.4 90.2 90.0  PLT 212 180 409   Basic Metabolic Panel: No results for input(s): NA, K, CL, CO2, GLUCOSE, BUN, CREATININE, CALCIUM, MG, PHOS in the last 168 hours. GFR: CrCl cannot be calculated (Patient's most recent lab result is older than the maximum 21 days allowed.). Liver Function Tests: No results for input(s): AST, ALT, ALKPHOS, BILITOT, PROT, ALBUMIN in the last 168 hours. No results for input(s): LIPASE, AMYLASE in the last 168 hours. No results for input(s): AMMONIA in the last 168 hours. Coagulation Profile:  Recent Labs Lab 11/28/16 1242 11/30/16 0504  INR 0.99 1.20   Cardiac Enzymes: No results for input(s): CKTOTAL, CKMB, CKMBINDEX, TROPONINI in the last 168 hours. BNP (last 3 results) No results for input(s): PROBNP in the last 8760 hours. HbA1C: No results for input(s): HGBA1C in the last 72 hours. CBG: No results for input(s): GLUCAP in the last 168 hours. Lipid Profile: No results for input(s): CHOL, HDL, LDLCALC, TRIG, CHOLHDL, LDLDIRECT in the last 72 hours. Thyroid Function Tests: No results for input(s): TSH, T4TOTAL, FREET4, T3FREE, THYROIDAB in the last 72 hours. Anemia Panel: No results for input(s): VITAMINB12, FOLATE, FERRITIN, TIBC, IRON, RETICCTPCT in the last 72 hours.    Radiology Studies: I have reviewed all of the imaging during this hospital visit personally     Scheduled Meds: . enoxaparin (LOVENOX) injection  1 mg/kg Subcutaneous Q12H  . pantoprazole  40 mg Oral Daily  . Warfarin - Pharmacist Dosing Inpatient   Does not apply q1800   Continuous Infusions:   LOS: 0 days        Fin Hupp Gerome Apley, MD [facility] Pager [phone]

## 2016-11-30 NOTE — Discharge Summary (Signed)
Patient ID: [name] MRN: [id number] DOB/AGE: 1970/08/12 46 y.o.  Admit date: 11/28/2016 Discharge date: 11/30/2016  Supervising Physician: Aletta Edouard  Patient Status: [facility] - In-pt  Admission Diagnoses: Abdominal pain after liver biopsy  Discharge Diagnoses:  Active Problems:   History of mitral valve replacement with mechanical valve   Systemic lupus erythematosus ([place])   Long term current use of anticoagulant therapy   Anticoagulation goal of INR 2.5 to 3.5   Gastroparesis   History of breast cancer   Discharged Condition: good  Hospital Course:  [name] is a 46 y.o. female with past medical history of mitral valve disease, peripheral neuropathy, raynaud disease, breast cancer, and lupus who is followed by gastroenterology for drug-induced hepatitis and hepatomegaly. She presented to radiology department 11/28/16 for percutaneous liver biopsy.  Post-procedure she complained on severe epigastric pain which was only mildly improved by GI cocktail.  CT Abdomen was obtained and shows a trace amount of bleeding around the liver.  She was admitted for observation overnight and blood thinners held.  The following morning, her hemoglobin was stable at 12.1, but she continued to complain of epigastric pain causing severe nausea and vomiting without relief from PO Zofran.  Exam was inconsistent with expected pain related to biopsy procedure, therefore hospitalist service was consulted for assistance in managing her symptoms as well as for transition back on anticoagulation.  Cardiology consult was requested as well.  This morning, patient is feeling much better.  Her GI symptoms were significantly improved by transition to phenergan and a second GI cocktail.  She has been able to eat and drink with tolerance. She has been able to void and ambulate in her room.  She has been resumed on anticoagulation with the assistance of cardiology and has been seen by internal medicine this  AM who agrees she is stable for discharge.  No follow-up is needed with interventional radiology.  She does have follow-up with her cardiologist and coumadin specialist on Monday of next week.  She is encouraged to keep this appointment. Patient stable for discharge home.   Consults: cardiology and internal medicine  Discharge Exam: Blood pressure 107/70, pulse (!) 58, temperature 98.2 F (36.8 C), temperature source Oral, resp. rate 16, height 5\' 4"  (1.626 m), weight 160 lb (72.6 kg), SpO2 98 %. General appearance: alert and no distress Resp: clear to auscultation bilaterally and no increased work of breathing Cardio: regular rate and rhythm and audible valve GI: soft, non-tender; bowel sounds normal; no masses,  no organomegaly Skin: Skin color, texture, turgor normal. No rashes or lesions or erythema to biopsy site.   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 kph      Medication List    TAKE these medications   ALPRAZolam 0.5 MG tablet Commonly known as:  XANAX Take 0.5 mg by mouth 3 (three) times daily as needed for anxiety or sleep.   cyanocobalamin 1000 MCG/ML injection Commonly known as:  (VITAMIN B-12) Inject 1 mL (1,000 mcg total) into the muscle every 30 (thirty) days.   DEXILANT 60 MG capsule Generic drug:  dexlansoprazole TAKE ONE CAPSULE BY MOUTH DAILY. What changed:  See the new instructions.   enoxaparin 80 MG/0.8ML injection Commonly known as:  LOVENOX Inject 0.8 mLs (80 mg total) into the skin every 12 (twelve) hours.   furosemide 40 MG tablet Commonly known as:  LASIX TAKE ONE TABLET BY MOUTH ONCE DAILY. What changed:  See the new instructions.   gabapentin 600 MG tablet Commonly known as:  NEURONTIN Take 600 mg by mouth 3 (three) times daily.     ibuprofen 800 MG tablet Commonly known as:  ADVIL,MOTRIN Take 800 mg by mouth every 6 (six) hours as needed for headache. Reported on 08/30/2015   KLOR-CON 10 10 MEQ tablet Generic drug:  potassium chloride Take 10 mEq by mouth daily. Take one tablet daily as directed   levothyroxine 100 MCG tablet Commonly known as:  SYNTHROID, LEVOTHROID Take 1 tablet (100 mcg total) by mouth daily.   metoCLOPramide 5 MG tablet Commonly known as:  REGLAN Take 1 tablet (5 mg total) by mouth 2 (two) times daily.   ondansetron 4 MG tablet Commonly known as:  ZOFRAN Take 4 mg by mouth every 8 (eight) hours as needed for nausea or vomiting.   oxyCODONE-acetaminophen 5-325 MG tablet Commonly known as:  PERCOCET/ROXICET Take 1-2 tablets by mouth every 4 (four) hours as needed for moderate pain or severe pain.   promethazine 12.5 MG tablet Commonly known as:  PHENERGAN Take 1 tablet (12.5 mg total) by mouth every 4 (four) hours as needed for nausea or vomiting.   ranitidine 150 MG tablet Commonly known as:  ZANTAC Take 1 tablet (150 mg total) by mouth at bedtime.   REQUIP 0.5 MG tablet Generic drug:  rOPINIRole Take 1 mg by mouth at bedtime.   riTUXimab 100 MG/10ML injection Commonly known as:  RITUXAN Inject into the vein. Every 4 months   warfarin 5 MG tablet Commonly known as:  COUMADIN Take 1 1/2 tablets daily except 2 tablets on Tuesdays What changed:  how much to take  how to take this  when to take this  additional instructions   zolpidem 10 MG tablet Commonly known as:  AMBIEN Take 10 mg by mouth at bedtime.         Electronically Signed: Docia Barrier, PA 11/30/2016, 11:55 AM   I have spent Greater Than 30 Minutes discharging [name].

## 2016-12-03 ENCOUNTER — Ambulatory Visit (INDEPENDENT_AMBULATORY_CARE_PROVIDER_SITE_OTHER): Payer: BLUE CROSS/BLUE SHIELD | Admitting: *Deleted

## 2016-12-03 DIAGNOSIS — Z7901 Long term (current) use of anticoagulants: Secondary | ICD-10-CM | POA: Diagnosis not present

## 2016-12-03 DIAGNOSIS — Z952 Presence of prosthetic heart valve: Secondary | ICD-10-CM

## 2016-12-03 DIAGNOSIS — Z5181 Encounter for therapeutic drug level monitoring: Secondary | ICD-10-CM | POA: Diagnosis not present

## 2016-12-03 LAB — POCT INR: INR: 1.5

## 2016-12-05 ENCOUNTER — Ambulatory Visit (INDEPENDENT_AMBULATORY_CARE_PROVIDER_SITE_OTHER): Payer: BLUE CROSS/BLUE SHIELD | Admitting: *Deleted

## 2016-12-05 ENCOUNTER — Telehealth: Payer: Self-pay

## 2016-12-05 DIAGNOSIS — Z5181 Encounter for therapeutic drug level monitoring: Secondary | ICD-10-CM | POA: Diagnosis not present

## 2016-12-05 DIAGNOSIS — D6859 Other primary thrombophilia: Secondary | ICD-10-CM | POA: Diagnosis not present

## 2016-12-05 DIAGNOSIS — Z952 Presence of prosthetic heart valve: Secondary | ICD-10-CM | POA: Diagnosis not present

## 2016-12-05 LAB — POCT INR: INR: 2.7

## 2016-12-05 NOTE — Telephone Encounter (Signed)
Pt called office asking about her liver biopsy results that she had done 11/28/16. She can be reached at [phone].  Routing to AB.

## 2016-12-06 NOTE — Telephone Encounter (Signed)
Late entry. Patient notified yesterday afternoon. Result note to include full plan.

## 2016-12-10 ENCOUNTER — Inpatient Hospital Stay (HOSPITAL_COMMUNITY): Admission: RE | Admit: 2016-12-10 | Payer: BLUE CROSS/BLUE SHIELD | Source: Ambulatory Visit

## 2016-12-11 ENCOUNTER — Telehealth: Payer: Self-pay | Admitting: Gastroenterology

## 2016-12-11 NOTE — Telephone Encounter (Signed)
[name]: is there any way we can get [name] in to see me sooner than late November? She is having GERD issues. Can use urgent.

## 2016-12-17 ENCOUNTER — Encounter (HOSPITAL_COMMUNITY)
Admission: RE | Admit: 2016-12-17 | Discharge: 2016-12-17 | Disposition: A | Discharge status: Home / Self Care | Payer: BLUE CROSS/BLUE SHIELD | Source: Ambulatory Visit | Attending: Rheumatology | Admitting: Rheumatology

## 2016-12-17 ENCOUNTER — Encounter (HOSPITAL_COMMUNITY): Payer: Self-pay

## 2016-12-17 DIAGNOSIS — M069 Rheumatoid arthritis, unspecified: Secondary | ICD-10-CM | POA: Diagnosis not present

## 2016-12-17 MED ORDER — METHYLPREDNISOLONE SODIUM SUCC 125 MG IJ SOLR
125.0000 mg | Freq: Once | INTRAMUSCULAR | Status: AC
Start: 2016-12-17 — End: 2016-12-17
  Administered 2016-12-17: 125 mg via INTRAVENOUS
  Filled 2016-12-17: qty 2

## 2016-12-17 MED ORDER — ACETAMINOPHEN 325 MG PO TABS
650.0000 mg | ORAL_TABLET | Freq: Once | ORAL | Status: AC
Start: 2016-12-17 — End: 2016-12-17
  Administered 2016-12-17: 650 mg via ORAL
  Filled 2016-12-17: qty 2

## 2016-12-17 MED ORDER — SODIUM CHLORIDE 0.9 % IV SOLN
INTRAVENOUS | Status: DC
  Administered 2016-12-17: 08:00:00 via INTRAVENOUS

## 2016-12-17 MED ORDER — SODIUM CHLORIDE 0.9 % IV SOLN
1000.0000 mg | Freq: Once | INTRAVENOUS | Status: AC
  Administered 2016-12-17: 1000 mg via INTRAVENOUS
  Filled 2016-12-17: qty 100

## 2016-12-17 MED ORDER — DIPHENHYDRAMINE HCL 50 MG/ML IJ SOLN
25.0000 mg | Freq: Once | INTRAMUSCULAR | Status: AC
Start: 2016-12-17 — End: 2016-12-17
  Administered 2016-12-17: 25 mg via INTRAVENOUS
  Filled 2016-12-17: qty 1

## 2016-12-21 DIAGNOSIS — H35382 Toxic maculopathy, left eye: Secondary | ICD-10-CM | POA: Diagnosis not present

## 2016-12-21 DIAGNOSIS — H35383 Toxic maculopathy, bilateral: Secondary | ICD-10-CM | POA: Diagnosis not present

## 2016-12-24 ENCOUNTER — Encounter (HOSPITAL_COMMUNITY): Payer: BLUE CROSS/BLUE SHIELD

## 2016-12-24 DIAGNOSIS — E663 Overweight: Secondary | ICD-10-CM | POA: Diagnosis not present

## 2016-12-24 DIAGNOSIS — Z6827 Body mass index (BMI) 27.0-27.9, adult: Secondary | ICD-10-CM | POA: Diagnosis not present

## 2016-12-24 DIAGNOSIS — G894 Chronic pain syndrome: Secondary | ICD-10-CM | POA: Diagnosis not present

## 2016-12-24 DIAGNOSIS — D511 Vitamin B12 deficiency anemia due to selective vitamin B12 malabsorption with proteinuria: Secondary | ICD-10-CM | POA: Diagnosis not present

## 2016-12-26 ENCOUNTER — Ambulatory Visit (INDEPENDENT_AMBULATORY_CARE_PROVIDER_SITE_OTHER): Payer: BLUE CROSS/BLUE SHIELD | Admitting: *Deleted

## 2016-12-26 DIAGNOSIS — Z5181 Encounter for therapeutic drug level monitoring: Secondary | ICD-10-CM | POA: Diagnosis not present

## 2016-12-26 DIAGNOSIS — Z952 Presence of prosthetic heart valve: Secondary | ICD-10-CM | POA: Diagnosis not present

## 2016-12-26 LAB — POCT INR: INR: 2

## 2017-01-01 ENCOUNTER — Encounter (HOSPITAL_COMMUNITY)
Admission: RE | Admit: 2017-01-01 | Discharge: 2017-01-01 | Disposition: A | Discharge status: Home / Self Care | Payer: BLUE CROSS/BLUE SHIELD | Source: Ambulatory Visit | Attending: Rheumatology | Admitting: Rheumatology

## 2017-01-01 DIAGNOSIS — M069 Rheumatoid arthritis, unspecified: Secondary | ICD-10-CM | POA: Diagnosis not present

## 2017-01-01 MED ORDER — SODIUM CHLORIDE 0.9 % IV SOLN
1000.0000 mg | Freq: Once | INTRAVENOUS | Status: AC
  Administered 2017-01-01: 1000 mg via INTRAVENOUS
  Filled 2017-01-01: qty 100

## 2017-01-01 MED ORDER — ACETAMINOPHEN 325 MG PO TABS
650.0000 mg | ORAL_TABLET | Freq: Once | ORAL | Status: AC
Start: 2017-01-01 — End: 2017-01-01
  Administered 2017-01-01: 650 mg via ORAL
  Filled 2017-01-01: qty 2

## 2017-01-01 MED ORDER — DIPHENHYDRAMINE HCL 50 MG/ML IJ SOLN
25.0000 mg | Freq: Once | INTRAMUSCULAR | Status: AC
Start: 2017-01-01 — End: 2017-01-01
  Administered 2017-01-01: 25 mg via INTRAVENOUS
  Filled 2017-01-01: qty 1

## 2017-01-01 MED ORDER — SODIUM CHLORIDE 0.9 % IV SOLN: 1000.0000 mg | Freq: Once | INTRAVENOUS | Status: DC

## 2017-01-01 MED ORDER — METHYLPREDNISOLONE SODIUM SUCC 125 MG IJ SOLR
125.0000 mg | Freq: Once | INTRAMUSCULAR | Status: AC
Start: 2017-01-01 — End: 2017-01-01
  Administered 2017-01-01: 125 mg via INTRAVENOUS
  Filled 2017-01-01: qty 2

## 2017-01-01 MED ORDER — SODIUM CHLORIDE 0.9 % IV SOLN
INTRAVENOUS | Status: DC
  Administered 2017-01-01: 250 mL via INTRAVENOUS

## 2017-01-08 ENCOUNTER — Other Ambulatory Visit (HOSPITAL_COMMUNITY)
Admission: RE | Admit: 2017-01-08 | Discharge: 2017-01-08 | Disposition: A | Discharge status: Home / Self Care | Payer: BLUE CROSS/BLUE SHIELD | Source: Ambulatory Visit | Attending: Adult Health | Admitting: Adult Health

## 2017-01-08 ENCOUNTER — Other Ambulatory Visit: Payer: Self-pay

## 2017-01-08 ENCOUNTER — Ambulatory Visit (INDEPENDENT_AMBULATORY_CARE_PROVIDER_SITE_OTHER): Payer: BLUE CROSS/BLUE SHIELD | Admitting: Adult Health

## 2017-01-08 ENCOUNTER — Encounter: Payer: Self-pay | Admitting: Adult Health

## 2017-01-08 VITALS — BP 118/60 | HR 66 | Ht 64.0 in | Wt 162.0 lb

## 2017-01-08 DIAGNOSIS — Z1211 Encounter for screening for malignant neoplasm of colon: Secondary | ICD-10-CM

## 2017-01-08 DIAGNOSIS — D069 Carcinoma in situ of cervix, unspecified: Secondary | ICD-10-CM | POA: Diagnosis not present

## 2017-01-08 DIAGNOSIS — Z87898 Personal history of other specified conditions: Secondary | ICD-10-CM

## 2017-01-08 DIAGNOSIS — Z853 Personal history of malignant neoplasm of breast: Secondary | ICD-10-CM | POA: Diagnosis not present

## 2017-01-08 DIAGNOSIS — Z01419 Encounter for gynecological examination (general) (routine) without abnormal findings: Secondary | ICD-10-CM | POA: Insufficient documentation

## 2017-01-08 DIAGNOSIS — Z8742 Personal history of other diseases of the female genital tract: Secondary | ICD-10-CM

## 2017-01-08 DIAGNOSIS — Z1212 Encounter for screening for malignant neoplasm of rectum: Secondary | ICD-10-CM | POA: Diagnosis not present

## 2017-01-08 DIAGNOSIS — D060 Carcinoma in situ of endocervix: Secondary | ICD-10-CM | POA: Diagnosis not present

## 2017-01-08 LAB — HEMOCCULT GUIAC POC 1CARD (OFFICE): FECAL OCCULT BLD: NEGATIVE

## 2017-01-08 NOTE — Progress Notes (Signed)
Patient ID: [name], female   DOB: 10-Jun-1970, 46 y.o.   MRN: [id number] History of Present Illness:  [name] is a 46 year old white female, married in for well woman gyn exam and pap,she had ASCUS +HPV 08/12/15.She had ruptured appendix and surgery this year. PCP is Dr Hilma Favors.  Current Medications, Allergies, Past Medical History, Past Surgical History, Family History and Social History were reviewed in [employer] record.     Review of Systems: Patient denies any headaches, hearing loss, fatigue, blurred vision, shortness of breath, chest pain, abdominal pain, problems with bowel movements, urination, or intercourse(not having sex). No joint pain or mood swings.    Physical Exam:BP 118/60 (BP Location: Right Arm, Patient Position: Sitting, Cuff Size: Normal)   Pulse 66   Ht 5\' 4"  (1.626 m)   Wt 162 lb (73.5 kg)   BMI 27.81 kg/m  General:  Well developed, well nourished, no acute distress Skin:  Warm and dry Neck:  Midline trachea, normal thyroid, good ROM, no lymphadenopathy Lungs; Clear to auscultation bilaterally Breast:  No dominant palpable mass, retraction, or nipple discharge, sp reconstruction with implant on left  Cardiovascular: Regular rate and rhythm Abdomen:  Soft, non tender, no hepatosplenomegaly Pelvic:  External genitalia is normal in appearance, no lesions.  The vagina is normal in appearance. Urethra has no lesions or masses. The cervix is bulbous.  Uterus is felt to be normal size, shape, and contour.  No adnexal masses or tenderness noted.Bladder is non tender, no masses felt. Rectal: Good sphincter tone, no polyps, or hemorrhoids felt.  Hemoccult negative. Extremities/musculoskeletal:  No swelling or varicosities noted, no clubbing or cyanosis Psych:  No mood changes, alert and cooperative,seems happy PHQ 2 score 0.  Impression: 1. Encounter for gynecological examination with Papanicolaou smear of cervix   2. Screening for  colorectal cancer   3. History of abnormal cervical Pap smear   4. History of breast cancer       Plan: Physical in 1 year Pap in 2021 if normal Mammogram yearly Colonoscopy at 6 Labs with PCP

## 2017-01-09 ENCOUNTER — Ambulatory Visit (INDEPENDENT_AMBULATORY_CARE_PROVIDER_SITE_OTHER): Payer: BLUE CROSS/BLUE SHIELD | Admitting: *Deleted

## 2017-01-09 ENCOUNTER — Ambulatory Visit: Payer: BLUE CROSS/BLUE SHIELD | Admitting: Gastroenterology

## 2017-01-09 DIAGNOSIS — Z5181 Encounter for therapeutic drug level monitoring: Secondary | ICD-10-CM | POA: Diagnosis not present

## 2017-01-09 DIAGNOSIS — Z1211 Encounter for screening for malignant neoplasm of colon: Secondary | ICD-10-CM | POA: Diagnosis not present

## 2017-01-09 DIAGNOSIS — Z952 Presence of prosthetic heart valve: Secondary | ICD-10-CM | POA: Diagnosis not present

## 2017-01-09 DIAGNOSIS — Z1212 Encounter for screening for malignant neoplasm of rectum: Secondary | ICD-10-CM

## 2017-01-09 LAB — POCT INR: INR: 3.2

## 2017-01-11 LAB — CYTOLOGY - PAP
DIAGNOSIS: HIGH — AB
HPV (WINDOPATH): DETECTED — AB

## 2017-01-14 ENCOUNTER — Telehealth: Payer: Self-pay | Admitting: Adult Health

## 2017-01-14 ENCOUNTER — Encounter: Payer: Self-pay | Admitting: Adult Health

## 2017-01-14 DIAGNOSIS — R87613 High grade squamous intraepithelial lesion on cytologic smear of cervix (HGSIL): Secondary | ICD-10-CM

## 2017-01-14 HISTORY — DX: High grade squamous intraepithelial lesion on cytologic smear of cervix (HGSIL): R87.613

## 2017-01-14 NOTE — Telephone Encounter (Signed)
Pt aware pap was +HSIL to make appt with Dr Elonda Husky for colpo.

## 2017-01-16 ENCOUNTER — Encounter: Payer: Self-pay | Admitting: Gastroenterology

## 2017-01-16 ENCOUNTER — Ambulatory Visit (INDEPENDENT_AMBULATORY_CARE_PROVIDER_SITE_OTHER): Payer: BLUE CROSS/BLUE SHIELD | Admitting: Gastroenterology

## 2017-01-16 ENCOUNTER — Ambulatory Visit: Payer: BLUE CROSS/BLUE SHIELD | Admitting: Gastroenterology

## 2017-01-16 VITALS — BP 123/80 | HR 69 | Temp 97.4°F | Ht 64.0 in | Wt 165.4 lb

## 2017-01-16 DIAGNOSIS — K3184 Gastroparesis: Secondary | ICD-10-CM | POA: Diagnosis not present

## 2017-01-16 DIAGNOSIS — R1010 Upper abdominal pain, unspecified: Secondary | ICD-10-CM | POA: Diagnosis not present

## 2017-01-16 MED ORDER — ONDANSETRON HCL 4 MG PO TABS: 4.0000 mg | ORAL_TABLET | Freq: Three times a day (TID) | ORAL | 3 refills | Status: DC | PRN

## 2017-01-16 NOTE — Progress Notes (Signed)
  Referring Provider: Golding, John, MD Primary Care Physician:  Golding, John, MD Primary GI: Dr. Rourk   Chief Complaint  Patient presents with  . Gastroesophageal Reflux    C/o bloating with some foods    HPI:   [name] is a 46 y.o. female presenting today with a history of erosive esophagitis and gastroparesis. Has done well on low dose Reglan BID historically, PPI. Zofran now covered by insurance and keeps on hand if needed. Unable to take erythromycin as she is on chronic Coumadin. Was on methotrexate for about a year then taken off due to elevated LFTs. From review of labs, it appears this may have been in 2015. 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 March 2017: Negative Hep B surface antigen, Negative Hep C antibody, negative Hep A IgM, negative Hep B core antibody. Liver biopsy with chronic hepatitis, mild steatosis, no significant fibrosis, differentials including drugs and partially treated autoimmune hepatitis. Discussed with patient. She has completely normal LFTs. Will need to follow transaminases and immunoglobulins. Consistent with AASLD, treatment is recommended with IgG greater than twice upper limit of normal if transaminases are at least fivefold upper limit of normal, with no treatment recommended if IgG less than twice upper limit of normal unless transaminases are greater than 10 fold. Will need to be monitored serially. Discussed second opinion, and she declines this. Bump in LFTs in past were likely secondary to methotrexate. She is on multiple medications now. LFTs remaining normal on multiple draws.   Notes upper abdominal discomfort with bloating. Intermittent nausea and rare vomiting noted. Tried a gluten-free diet but not 100% strict with this, but she did notice improvement in symptoms. Prior EGD several years ago with normal appearing duodenum but no biopsies. Celiac serologies have been negative historically. Continues with Dexilant. Wants to  hold on any endoscopic evaluations now but discuss at next visit.   Past Medical History:  Diagnosis Date  . Abnormal Papanicolaou smear of cervix with positive human papilloma virus (HPV) test 08/17/2015  . Anticoagulation goal of INR 2.5 to 3.5   . Anxiety   . B12 deficiency   . BRCA1 negative   . BRCA2 negative   . Breast cancer, left breast (HCC) 2011   S/P mastectomy; chemo; "no radiation due to lupus"  . Bursitis of right knee    Septic bursitis  . CHF (congestive heart failure) (HCC)   . Chronic lower back pain   . Drug-induced hepatitis    States per her rheumatologist, transaminases elevated but normalized after drug removed for   . Fibromyalgia   . GERD (gastroesophageal reflux disease)   . 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 History of abnormal cervical Pap smear 08/12/2015  . History of blood transfusion    "when I had my heart OR; maybe again when I was dealing w/breast cancer" (11/28/2016)  . HSIL (high grade squamous intraepithelial lesion) on Pap smear of cervix 01/14/2017   colpo with Dr Eure   . Hypothyroidism   . Mitral valve disease, rheumatic    St. Jude prosthesis  . Peripheral neuropathy   . Pneumonia   . PONV (postoperative nausea and vomiting)   . Raynaud disease   . SLE (systemic lupus erythematosus) (HCC)     Past Surgical History:  Procedure Laterality Date  . APPENDECTOMY N/A 05/23/2016   Procedure: OPEN APPENDECTOMY WITH DRAINAGE OF ABSCESS;  Surgeon: Haywood Ingram, MD;  Location: [facility] OR;    Service: General;  Laterality: N/A;  . APPENDECTOMY    . BREAST BIOPSY Left 2012  . BREAST IMPLANT EXCHANGE Left 12/2015  . CARDIAC CATHETERIZATION  2008  . CARDIAC VALVE REPLACEMENT    . ENDOMETRIAL ABLATION  2008   She no longer has menses  . ESOPHAGOGASTRODUODENOSCOPY N/A 03/25/2013   Dr. Raliegh Scarlet reflux esophagitis-likely source of patient's symptoms (patulous EG junction). Hiatal hernia otherwise normal  .  LAPAROSCOPIC CHOLECYSTECTOMY    . LIVER BIOPSY  11/28/2016   liver core/notes 11/28/2016  . MASTECTOMY Left 2012  . MASTOPEXY  02/14/2011   Procedure: MASTOPEXY;  Surgeon: Macon Large;  Location: [facility];  Service: Plastics;  Laterality: Bilateral;  Right Breast Reduction   . MITRAL VALVE REPLACEMENT  2008  . TISSUE EXPANDER PLACEMENT  02/14/2011   Procedure: TISSUE EXPANDER;  Surgeon: Macon Large;  Location: [facility];  Service: Plastics;  Laterality: Bilateral;  Left Breast Remove Tissue Expander Placement of Implant Breast Reconstruction  . TISSUE EXPANDER PLACEMENT Left 11/16/2010   breasst  . TUBAL LIGATION      Current Outpatient Medications  Medication Sig Dispense Refill  . ALPRAZolam (XANAX) 0.5 MG tablet Take 0.5 mg by mouth 3 (three) times daily as needed for anxiety or sleep.     . cyanocobalamin (,VITAMIN B-12,) 1000 MCG/ML injection Inject 1 mL (1,000 mcg total) into the muscle every 30 (thirty) days. 10 mL prn  . DEXILANT 60 MG capsule TAKE ONE CAPSULE BY MOUTH DAILY. (Patient taking differently: TAKE 60 MG BY MOUTH DAILY.) 90 capsule 3  . furosemide (LASIX) 40 MG tablet TAKE ONE TABLET BY MOUTH ONCE DAILY. (Patient taking differently: TAKE 40 MG BY MOUTH ONCE DAILY.) 90 tablet 3  . gabapentin (NEURONTIN) 600 MG tablet Take 600 mg by mouth 3 (three) times daily.     Marland Kitchen ibuprofen (ADVIL,MOTRIN) 800 MG tablet Take 800 mg by mouth every 6 (six) hours as needed for headache. Reported on 08/30/2015    . levothyroxine (SYNTHROID, LEVOTHROID) 100 MCG tablet Take 1 tablet (100 mcg total) by mouth daily. 90 tablet 4  . metoCLOPramide (REGLAN) 5 MG tablet Take 1 tablet (5 mg total) by mouth 2 (two) times daily. 180 tablet 3  . ondansetron (ZOFRAN) 4 MG tablet Take 4 mg by mouth every 8 (eight) hours as needed for nausea or vomiting.    Marland Kitchen oxyCODONE-acetaminophen (PERCOCET/ROXICET) 5-325 MG tablet Take 1-2 tablets by mouth every 4 (four) hours as needed for moderate pain or severe pain.       . potassium chloride (KLOR-CON 10) 10 MEQ tablet Take 10 mEq by mouth daily. Take one tablet daily as directed    . promethazine (PHENERGAN) 12.5 MG tablet Take 1 tablet (12.5 mg total) by mouth every 4 (four) hours as needed for nausea or vomiting. 20 tablet 0  . ranitidine (ZANTAC) 150 MG tablet Take 1 tablet (150 mg total) by mouth at bedtime. 90 tablet 3  . riTUXimab (RITUXAN) 100 MG/10ML injection Inject into the vein. Every 4 months    . rOPINIRole (REQUIP) 0.5 MG tablet Take 1 mg by mouth at bedtime.     Marland Kitchen warfarin (COUMADIN) 5 MG tablet Take 1 1/2 tablets daily except 2 tablets on Tuesdays (Patient taking differently: Take 7.5-10 mg by mouth See admin instructions. 7.62m Mon, Wed, Fri, Sun - 118mTues, Thurs, Sat) 60 tablet 6  . zolpidem (AMBIEN) 10 MG tablet Take 10 mg by mouth at bedtime.      No current  facility-administered medications for this visit.     Allergies as of 01/16/2017 - Review Complete 01/16/2017  Allergen Reaction Noted  . Methotrexate derivatives  11/29/2016  . Other  05/25/2016    Family History  Problem Relation Age of Onset  . Hypertension Mother   . Hyperlipidemia Mother   . Hypertension Father   . Colon cancer Neg Hx     Social History   Socioeconomic History  . Marital status: Married    Spouse name: None  . Number of children: 2  . Years of education: None  . Highest education level: None  Social Needs  . Financial resource strain: None  . Food insecurity - worry: None  . Food insecurity - inability: None  . Transportation needs - medical: None  . Transportation needs - non-medical: None  Occupational History  . None  Tobacco Use  . Smoking status: Former Smoker    Packs/day: 1.00    Years: 15.00    Pack years: 15.00    Types: Cigarettes    Last attempt to quit: 02/19/2006    Years since quitting: 10.9  . Smokeless tobacco: Never Used  Substance and Sexual Activity  . Alcohol use: Yes    Alcohol/week: 0.0 oz    Comment:  11/28/2016 "couple drinks/yearZ"  . Drug use: No  . Sexual activity: Not Currently    Birth control/protection: Surgical    Comment: tubal and ablation  Other Topics Concern  . None  Social History Narrative  . None    Review of Systems: Gen: Denies fever, chills, anorexia. Denies fatigue, weakness, weight loss.  CV: Denies chest pain, palpitations, syncope, peripheral edema, and claudication. Resp: Denies dyspnea at rest, cough, wheezing, coughing up blood, and pleurisy. GI:see HPI  Derm: Denies rash, itching, dry skin Psych: Denies depression, anxiety, memory loss, confusion. No homicidal or suicidal ideation.  Heme: Denies bruising, bleeding, and enlarged lymph nodes.  Physical Exam: BP 123/80   Pulse 69   Temp (!) 97.4 F (36.3 C) (Oral)   Ht 5' 4" (1.626 m)   Wt 165 lb 6.4 oz (75 kg)   BMI 28.39 kg/m  General:   Alert and oriented. No distress noted. Pleasant and cooperative.  Head:  Normocephalic and atraumatic. Eyes:  Conjuctiva clear without scleral icterus. Mouth:  Oral mucosa pink and moist. Good dentition. No lesions. Abdomen:  +BS, soft, mild epigastric TTP and non-distended. No rebound or guarding. No HSM or masses noted. Msk:  Symmetrical without gross deformities. Normal posture. Extremities:  Without edema. Neurologic:  Alert and  oriented x4 Psych:  Alert and cooperative. Normal mood and affect.

## 2017-01-16 NOTE — Patient Instructions (Addendum)
Let's try following a gluten-free diet as much as possible. Let me know how you do on this.  I think we may need to consider an upper endoscopy in the future. We can talk about it at the next visit!  We will see you in February 2019!  Have a wonderful Christmas!

## 2017-01-20 NOTE — Assessment & Plan Note (Signed)
Abdominal discomfort with bloating in setting of gastroparesis. Last EGD in 2015. Some improvement with avoidance of gluten. Celiac serologies have been negative historically, with normal-appearing visualized duodenum. Discussed trial of gluten-free diet. May need to pursue EGD with duodenal biopsies in future, especially if continued dyspepsia. Patient would like to hold on endoscopic evaluation until the new year. No alarm features. Continue Dexilant and return in Feb 2019.

## 2017-01-20 NOTE — Assessment & Plan Note (Signed)
Low-dose Reglan BID, Zofran prn, Dexilant. Continue dietary and behavior modifications. No alarm features and weight has increased from last visit.

## 2017-01-21 DIAGNOSIS — D511 Vitamin B12 deficiency anemia due to selective vitamin B12 malabsorption with proteinuria: Secondary | ICD-10-CM | POA: Diagnosis not present

## 2017-01-21 DIAGNOSIS — E538 Deficiency of other specified B group vitamins: Secondary | ICD-10-CM | POA: Diagnosis not present

## 2017-01-21 DIAGNOSIS — Z1389 Encounter for screening for other disorder: Secondary | ICD-10-CM | POA: Diagnosis not present

## 2017-01-21 DIAGNOSIS — E663 Overweight: Secondary | ICD-10-CM | POA: Diagnosis not present

## 2017-01-21 DIAGNOSIS — G894 Chronic pain syndrome: Secondary | ICD-10-CM | POA: Diagnosis not present

## 2017-01-21 DIAGNOSIS — Z6828 Body mass index (BMI) 28.0-28.9, adult: Secondary | ICD-10-CM | POA: Diagnosis not present

## 2017-01-21 NOTE — Progress Notes (Signed)
cc'ed to pcp °

## 2017-01-23 ENCOUNTER — Ambulatory Visit (INDEPENDENT_AMBULATORY_CARE_PROVIDER_SITE_OTHER): Payer: BLUE CROSS/BLUE SHIELD | Admitting: Cardiology

## 2017-01-23 ENCOUNTER — Encounter: Payer: Self-pay | Admitting: Cardiology

## 2017-01-23 VITALS — BP 104/68 | HR 66 | Ht 64.0 in | Wt 165.0 lb

## 2017-01-23 DIAGNOSIS — I059 Rheumatic mitral valve disease, unspecified: Secondary | ICD-10-CM | POA: Diagnosis not present

## 2017-01-23 DIAGNOSIS — Z952 Presence of prosthetic heart valve: Secondary | ICD-10-CM

## 2017-01-23 NOTE — Progress Notes (Signed)
Cardiology Office Note  Date: 01/23/2017   ID: [name], DOB August 06, 1970, MRN [id number]  PCP: Sharilyn Sites, MD  Primary Cardiologist: Rozann Lesches, MD   Chief Complaint  Patient presents with  . Valvular heart disease    History of Present Illness: [name] is a 46 y.o. female last seen in June.  She presents for a routine follow-up visit today with her daughter.  She reports no new symptoms, specifically no progressive exertional chest pain, shortness of breath, or palpitations.  She has had no fevers or chills.  She continues on Coumadin with follow-up in the anticoagulation clinic.  Recent INR was 3.2.  She reports no spontaneous bleeding problems.  Echocardiogram from May revealed LVEF 55-60% with normally functioning St. Jude prosthesis in the mitral position.  We went over the results today.  Past Medical History:  Diagnosis Date  . Abnormal Papanicolaou smear of cervix with positive human papilloma virus (HPV) test 08/17/2015  . Anticoagulation goal of INR 2.5 to 3.5   . Anxiety   . B12 deficiency   . BRCA1 negative   . BRCA2 negative   . Breast cancer, left breast ([facility]) 2011   S/P mastectomy; chemo; "no radiation due to lupus"  . Bursitis of right knee    Septic bursitis  . CHF (congestive heart failure) ([facility])   . Chronic lower back pain   . Drug-induced hepatitis    States per her rheumatologist, transaminases elevated but normalized after drug removed for   . Fibromyalgia   . GERD (gastroesophageal reflux disease)   . 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 History of abnormal cervical Pap smear 08/12/2015  . History of blood transfusion    "when I had my heart OR; maybe again when I was dealing w/breast cancer" (11/28/2016)  . HSIL (high grade squamous intraepithelial lesion) on Pap smear of cervix 01/14/2017   colpo with Dr Elonda Husky   . Hypothyroidism   . Mitral valve disease, rheumatic    St. Jude prosthesis  .  Peripheral neuropathy   . Pneumonia   . PONV (postoperative nausea and vomiting)   . Raynaud disease   . SLE (systemic lupus erythematosus) ([facility])     Past Surgical History:  Procedure Laterality Date  . APPENDECTOMY N/A 05/23/2016   Procedure: OPEN APPENDECTOMY WITH DRAINAGE OF ABSCESS;  Surgeon: Fanny Skates, MD;  Location: [facility];  Service: General;  Laterality: N/A;  . APPENDECTOMY    . BREAST BIOPSY Left 2012  . BREAST IMPLANT EXCHANGE Left 12/2015  . CARDIAC CATHETERIZATION  2008  . CARDIAC VALVE REPLACEMENT    . ENDOMETRIAL ABLATION  2008   She no longer has menses  . ESOPHAGOGASTRODUODENOSCOPY N/A 03/25/2013   Dr. Raliegh Scarlet reflux esophagitis-likely source of patient's symptoms (patulous EG junction). Hiatal hernia otherwise normal  . LAPAROSCOPIC CHOLECYSTECTOMY    . LIVER BIOPSY  11/28/2016   liver core/notes 11/28/2016  . MASTECTOMY Left 2012  . MASTOPEXY  02/14/2011   Procedure: MASTOPEXY;  Surgeon: Macon Large;  Location: [facility];  Service: Plastics;  Laterality: Bilateral;  Right Breast Reduction   . MITRAL VALVE REPLACEMENT  2008  . TISSUE EXPANDER PLACEMENT  02/14/2011   Procedure: TISSUE EXPANDER;  Surgeon: Macon Large;  Location: [facility];  Service: Plastics;  Laterality: Bilateral;  Left Breast Remove Tissue Expander Placement of Implant Breast Reconstruction  . TISSUE EXPANDER PLACEMENT Left 11/16/2010   breasst  . TUBAL LIGATION  Current Outpatient Medications  Medication Sig Dispense Refill  . ALPRAZolam (XANAX) 0.5 MG tablet Take 0.5 mg by mouth 3 (three) times daily as needed for anxiety or sleep.     . cyanocobalamin (,VITAMIN B-12,) 1000 MCG/ML injection Inject 1 mL (1,000 mcg total) into the muscle every 30 (thirty) days. 10 mL prn  . DEXILANT 60 MG capsule TAKE ONE CAPSULE BY MOUTH DAILY. (Patient taking differently: TAKE 60 MG BY MOUTH DAILY.) 90 capsule 3  . furosemide (LASIX) 40 MG tablet Take 1 tablet by mouth daily.    Marland Kitchen gabapentin  (NEURONTIN) 600 MG tablet Take 600 mg by mouth 3 (three) times daily.     Marland Kitchen ibuprofen (ADVIL,MOTRIN) 800 MG tablet Take 800 mg by mouth every 6 (six) hours as needed for headache. Reported on 08/30/2015    . levothyroxine (SYNTHROID, LEVOTHROID) 100 MCG tablet Take 1 tablet (100 mcg total) by mouth daily. 90 tablet 4  . metoCLOPramide (REGLAN) 5 MG tablet Take 1 tablet (5 mg total) by mouth 2 (two) times daily. 180 tablet 3  . ondansetron (ZOFRAN) 4 MG tablet Take 1 tablet (4 mg total) by mouth every 8 (eight) hours as needed for nausea or vomiting. 60 tablet 3  . oxyCODONE-acetaminophen (PERCOCET/ROXICET) 5-325 MG tablet Take 1-2 tablets by mouth every 4 (four) hours as needed for moderate pain or severe pain.     . potassium chloride (KLOR-CON 10) 10 MEQ tablet Take 10 mEq by mouth daily. Take one tablet daily as directed    . promethazine (PHENERGAN) 12.5 MG tablet Take 1 tablet (12.5 mg total) by mouth every 4 (four) hours as needed for nausea or vomiting. 20 tablet 0  . ranitidine (ZANTAC) 150 MG tablet Take 1 tablet (150 mg total) by mouth at bedtime. 90 tablet 3  . riTUXimab (RITUXAN) 100 MG/10ML injection Inject into the vein. Every 4 months    . rOPINIRole (REQUIP) 0.5 MG tablet Take 1 mg by mouth at bedtime.     Marland Kitchen warfarin (COUMADIN) 5 MG tablet Take 1 1/2 tablets daily except 2 tablets on Tuesdays (Patient taking differently: Take 7.5-10 mg by mouth See admin instructions. 7.87m Mon, Wed, Fri, Sun - 134mTues, Thurs, Sat) 60 tablet 6  . zolpidem (AMBIEN) 10 MG tablet Take 10 mg by mouth at bedtime.      No current facility-administered medications for this visit.    Allergies:  Methotrexate derivatives and Other   Social History: The patient  reports that she quit smoking about 10 years ago. Her smoking use included cigarettes. She has a 15.00 pack-year smoking history. she has never used smokeless tobacco. She reports that she drinks alcohol. She reports that she does not use drugs.    ROS:  Please see the history of present illness. Otherwise, complete review of systems is positive for none.  All other systems are reviewed and negative.   Physical Exam: VS:  BP 104/68   Pulse 66   Ht 5' 4" (1.626 m)   Wt 165 lb (74.8 kg)   SpO2 99% Comment: on room air  BMI 28.32 kg/m , BMI Body mass index is 28.32 kg/m.  Wt Readings from Last 3 Encounters:  01/23/17 165 lb (74.8 kg)  01/16/17 165 lb 6.4 oz (75 kg)  01/08/17 162 lb (73.5 kg)    General: Patient appears comfortable at rest. HEENT: Conjunctiva and lids normal, oropharynx clear. Neck: Supple, no elevated JVP or carotid bruits, no thyromegaly. Lungs: Clear to auscultation, nonlabored breathing  at rest. Cardiac: Regular rate and rhythm, no S3, prosthetic valve sound in S1, no significant murmur, no pericardial rub. Abdomen: Soft, nontender, bowel sounds present, no guarding or rebound. Extremities: No pitting edema, distal pulses 2+. Skin: Warm and dry. Musculoskeletal: No kyphosis. Neuropsychiatric: Alert and oriented x3, affect grossly appropriate.  ECG: I personally reviewed the tracing from 05/23/2016 which showed sinus rhythm with rightward axis.  Recent Labwork: 05/24/2016: Magnesium 2.0 06/20/2016: TSH 0.68 11/05/2016: ALT 21; AST 24; BUN 13; Creatinine, Ser 0.66; Potassium 3.9; Sodium 137 11/30/2016: Hemoglobin 12.0; Platelets 196   Other Studies Reviewed Today:  Echocardiogram 07/17/2016: Study Conclusions  - Left ventricle: The cavity size was normal. Wall thickness was   normal. Systolic function was normal. The estimated ejection   fraction was in the range of 55% to 60%. Wall motion was normal;   there were no regional wall motion abnormalities. Features are   consistent with a pseudonormal left ventricular filling pattern,   with concomitant abnormal relaxation and increased filling   pressure (grade 2 diastolic dysfunction). - Aortic valve: Mildly calcified annulus. Trileaflet. Valve area    (VTI): 1.93 cm^2. Valve area (Vmax): 1.72 cm^2. Valve area   (Vmean): 1.78 cm^2. - Mitral valve: A St. Jude [employer] mechanical prosthesis was   present. There was trivial regurgitation. Mean gradient (D): 3 mm   Hg. - Left atrium: The atrium was mildly to moderately dilated. - Right atrium: Central venous pressure (est): 3 mm Hg. - Tricuspid valve: There was trivial regurgitation. - Pulmonary arteries: PA peak pressure: 17 mm Hg (S). - Pericardium, extracardiac: A prominent pericardial fat pad was   present.  Impressions:  - Normal LV wall thickness with LVEF 55-60% and grade 2 diastolic   dysfunction. Mild to moderate left atrial enlargement. St. Jude   mechanical prosthesis in mitral position with normal gradient.   Trivial tricuspid regurgitation with PASP estimated 17 mmHg.  Assessment and Plan:  1.  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  She continues on Coumadin with follow-up in the anticoagulation clinic.  2.  History of lupus, continues to follow with PCP and rheumatology.  Current medicines were reviewed with the patient today.  Disposition: Follow-up in 6 months.  Signed, Satira Sark, MD, [facility] 01/23/2017 9:56 AM    [facility] at [facility] [address], [place], [place] [address] Phone: [phone]; Fax: [phone]

## 2017-01-23 NOTE — Patient Instructions (Signed)

## 2017-01-25 ENCOUNTER — Encounter: Payer: BLUE CROSS/BLUE SHIELD | Admitting: Obstetrics & Gynecology

## 2017-02-04 ENCOUNTER — Ambulatory Visit (INDEPENDENT_AMBULATORY_CARE_PROVIDER_SITE_OTHER): Payer: BLUE CROSS/BLUE SHIELD | Admitting: *Deleted

## 2017-02-04 DIAGNOSIS — Z5181 Encounter for therapeutic drug level monitoring: Secondary | ICD-10-CM | POA: Diagnosis not present

## 2017-02-04 DIAGNOSIS — Z1212 Encounter for screening for malignant neoplasm of rectum: Secondary | ICD-10-CM

## 2017-02-04 DIAGNOSIS — Z952 Presence of prosthetic heart valve: Secondary | ICD-10-CM | POA: Diagnosis not present

## 2017-02-04 DIAGNOSIS — Z1211 Encounter for screening for malignant neoplasm of colon: Secondary | ICD-10-CM | POA: Diagnosis not present

## 2017-02-04 LAB — POCT INR: INR: 3.3

## 2017-02-07 ENCOUNTER — Other Ambulatory Visit: Payer: Self-pay

## 2017-02-07 ENCOUNTER — Encounter: Payer: Self-pay | Admitting: Obstetrics & Gynecology

## 2017-02-07 ENCOUNTER — Ambulatory Visit (INDEPENDENT_AMBULATORY_CARE_PROVIDER_SITE_OTHER): Payer: BLUE CROSS/BLUE SHIELD | Admitting: Obstetrics & Gynecology

## 2017-02-07 VITALS — BP 120/82 | HR 81 | Ht 64.0 in | Wt 167.0 lb

## 2017-02-07 DIAGNOSIS — Z3202 Encounter for pregnancy test, result negative: Secondary | ICD-10-CM

## 2017-02-07 DIAGNOSIS — R87613 High grade squamous intraepithelial lesion on cytologic smear of cervix (HGSIL): Secondary | ICD-10-CM | POA: Diagnosis not present

## 2017-02-07 LAB — POCT URINE PREGNANCY: Preg Test, Ur: NEGATIVE

## 2017-02-07 MED ORDER — ENOXAPARIN SODIUM 80 MG/0.8ML ~~LOC~~ SOLN: 80.0000 mg | Freq: Two times a day (BID) | SUBCUTANEOUS | 0 refills | Status: DC

## 2017-02-07 NOTE — Progress Notes (Signed)
Colposcopy Procedure Note:  Colposcopy Procedure Note  Indications: Pap smear 1 months ago showed: high-grade squamous intraepithelial neoplasia  (HGSIL-encompassing moderate and severe dysplasia). The prior pap showed ASCUS with POSITIVE high risk HPV.  Prior cervical/vaginal disease: no exam was done. Prior cervical treatment: no treatment.  Smoker:  No. New sexual partner:  No.    History of abnormal Pap: yes  Procedure Details  The risks and benefits of the procedure and Written informed consent obtained.  Speculum placed in vagina and excellent visualization of cervix achieved, cervix swabbed x 3 with acetic acid solution.  Findings: Cervix: visible lesion(s) at 3 o'clock, punctation noted at 3 o'clock and mosaicism noted at 3 o'clock; SCJ visualized - lesion at 3 o'clock and lesion extending into the endocervical canal Vaginal inspection: normal without visible lesions. Vulvar colposcopy: vulvar colposcopy not performed.  Specimens: cervical biopsy  Complications: none.  Plan:Proceed with laser conization for therapeutic and diagnostic indications   Preoperative History and Physical  [name] is a 46 y.o. [id number] with No LMP recorded. Patient is not currently having periods (Reason: Other). admitted for a laser conization of the cervix for HSIL on pap with lesion seen and extending into the endocervical canal.  Will need laser conization for treatment  PMH:    Past Medical History:  Diagnosis Date  . Abnormal Papanicolaou smear of cervix with positive human papilloma virus (HPV) test 08/17/2015  . Anticoagulation goal of INR 2.5 to 3.5   . Anxiety   . B12 deficiency   . BRCA1 negative   . BRCA2 negative   . Breast cancer, left breast ([place]) 2011   S/P mastectomy; chemo; "no radiation due to lupus"  . Bursitis of right knee    Septic bursitis  . CHF (congestive heart failure) ([place])   . Chronic lower back pain   . Drug-induced hepatitis    States per her  rheumatologist, transaminases elevated but normalized after drug removed for   . Fibromyalgia   . GERD (gastroesophageal reflux disease)   . 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 History of abnormal cervical Pap smear 08/12/2015  . History of blood transfusion    "when I had my heart OR; maybe again when I was dealing w/breast cancer" (11/28/2016)  . HSIL (high grade squamous intraepithelial lesion) on Pap smear of cervix 01/14/2017   colpo with Dr Elonda Husky   . Hypothyroidism   . Mitral valve disease, rheumatic    [employer]. [employer] prosthesis  . Peripheral neuropathy   . Pneumonia   . PONV (postoperative nausea and vomiting)   . Raynaud disease   . SLE (systemic lupus erythematosus) ([facility])     PSH:     Past Surgical History:  Procedure Laterality Date  . APPENDECTOMY N/A 05/23/2016   Procedure: OPEN APPENDECTOMY WITH DRAINAGE OF ABSCESS;  Surgeon: Fanny Skates, MD;  Location: [facility];  Service: General;  Laterality: N/A;  . APPENDECTOMY    . BREAST BIOPSY Left 2012  . BREAST IMPLANT EXCHANGE Left 12/2015  . CARDIAC CATHETERIZATION  2008  . CARDIAC VALVE REPLACEMENT    . ENDOMETRIAL ABLATION  2008   She no longer has menses  . ESOPHAGOGASTRODUODENOSCOPY N/A 03/25/2013   Dr. Raliegh Scarlet reflux esophagitis-likely source of patient's symptoms (patulous EG junction). Hiatal hernia otherwise normal  . LAPAROSCOPIC CHOLECYSTECTOMY    . LIVER BIOPSY  11/28/2016   liver core/notes 11/28/2016  . MASTECTOMY Left 2012  . MASTOPEXY  02/14/2011   Procedure: MASTOPEXY;  Surgeon: Macon Large;  Location: [facility];  Service: Plastics;  Laterality: Bilateral;  Right Breast Reduction   . MITRAL VALVE REPLACEMENT  2008  . TISSUE EXPANDER PLACEMENT  02/14/2011   Procedure: TISSUE EXPANDER;  Surgeon: Macon Large;  Location: [facility];  Service: Plastics;  Laterality: Bilateral;  Left Breast Remove Tissue Expander Placement of Implant Breast Reconstruction  . TISSUE EXPANDER  PLACEMENT Left 11/16/2010   breasst  . TUBAL LIGATION      POb/GynH:      OB History    Gravida Para Term Preterm AB Living   2 2 0     2   SAB TAB Ectopic Multiple Live Births           2      SH:   Social History   Tobacco Use  . Smoking status: Former Smoker    Packs/day: 1.00    Years: 15.00    Pack years: 15.00    Types: Cigarettes    Last attempt to quit: 02/19/2006    Years since quitting: 10.9  . Smokeless tobacco: Never Used  Substance Use Topics  . Alcohol use: Yes    Alcohol/week: 0.0 oz    Comment: 11/28/2016 "couple drinks/yearZ"  . Drug use: No    FH:    Family History  Problem Relation Age of Onset  . Hypertension Mother   . Hyperlipidemia Mother   . Hypertension Father   . Colon cancer Neg Hx      Allergies:  Allergies  Allergen Reactions  . Methotrexate Derivatives   . 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 kph    Medications:       Current Outpatient Medications:  .  ALPRAZolam (XANAX) 0.5 MG tablet, Take 0.5 mg by mouth 3 (three) times daily as needed for anxiety or sleep. , Disp: , Rfl:  .  cyanocobalamin (,VITAMIN B-12,) 1000 MCG/ML injection, Inject 1 mL (1,000 mcg total) into the muscle every 30 (thirty) days., Disp: 10 mL, Rfl: prn .  DEXILANT 60 MG capsule, TAKE ONE CAPSULE BY MOUTH DAILY. (Patient taking differently: TAKE 60 MG BY MOUTH DAILY.), Disp: 90 capsule, Rfl: 3 .  furosemide (LASIX) 40 MG tablet, Take 1 tablet by mouth daily., Disp: , Rfl:  .  gabapentin (NEURONTIN) 600 MG tablet, Take 600 mg by mouth 3 (three) times daily. , Disp: , Rfl:  .  ibuprofen (ADVIL,MOTRIN) 800 MG tablet, Take 800 mg by mouth every 6 (six) hours as needed for headache. Reported on 08/30/2015, Disp: , Rfl:  .  levothyroxine (SYNTHROID, LEVOTHROID) 100 MCG tablet, Take 1 tablet (100 mcg total) by mouth daily., Disp: 90 tablet, Rfl: 4 .  metoCLOPramide  (REGLAN) 5 MG tablet, Take 1 tablet (5 mg total) by mouth 2 (two) times daily., Disp: 180 tablet, Rfl: 3 .  ondansetron (ZOFRAN) 4 MG tablet, Take 1 tablet (4 mg total) by mouth every 8 (eight) hours as needed for nausea or vomiting., Disp: 60 tablet, Rfl: 3 .  oxyCODONE-acetaminophen (PERCOCET/ROXICET) 5-325 MG tablet, Take 1-2 tablets by mouth every 4 (four) hours as needed for moderate pain or severe pain. , Disp: , Rfl:  .  potassium chloride (KLOR-CON 10) 10 MEQ tablet, Take 10 mEq by mouth daily. Take one tablet daily as directed, Disp: , Rfl:  .  promethazine (PHENERGAN) 12.5 MG tablet, Take 1  tablet (12.5 mg total) by mouth every 4 (four) hours as needed for nausea or vomiting., Disp: 20 tablet, Rfl: 0 .  ranitidine (ZANTAC) 150 MG tablet, Take 1 tablet (150 mg total) by mouth at bedtime., Disp: 90 tablet, Rfl: 3 .  riTUXimab (RITUXAN) 100 MG/10ML injection, Inject into the vein. Every 4 months, Disp: , Rfl:  .  rOPINIRole (REQUIP) 0.5 MG tablet, Take 1 mg by mouth at bedtime. , Disp: , Rfl:  .  warfarin (COUMADIN) 5 MG tablet, Take 1 1/2 tablets daily except 2 tablets on Tuesdays (Patient taking differently: Take 7.5-10 mg by mouth See admin instructions. 7.43m Mon, Wed, Fri, Sun - 144mTues, Thurs, Sat), Disp: 60 tablet, Rfl: 6 .  zolpidem (AMBIEN) 10 MG tablet, Take 10 mg by mouth at bedtime. , Disp: , Rfl:  .  enoxaparin (LOVENOX) 80 MG/0.8ML injection, Inject 0.8 mLs (80 mg total) into the skin every 12 (twelve) hours., Disp: 20 Syringe, Rfl: 0  Review of Systems:   Review of Systems  Constitutional: Negative for fever, chills, weight loss, malaise/fatigue and diaphoresis.  HENT: Negative for hearing loss, ear pain, nosebleeds, congestion, sore throat, neck pain, tinnitus and ear discharge.   Eyes: Negative for blurred vision, double vision, photophobia, pain, discharge and redness.  Respiratory: Negative for cough, hemoptysis, sputum production, shortness of breath, wheezing and  stridor.   Cardiovascular: Negative for chest pain, palpitations, orthopnea, claudication, leg swelling and PND.  Gastrointestinal: Positive for abdominal pain. Negative for heartburn, nausea, vomiting, diarrhea, constipation, blood in stool and melena.  Genitourinary: Negative for dysuria, urgency, frequency, hematuria and flank pain.  Musculoskeletal: Negative for myalgias, back pain, joint pain and falls.  Skin: Negative for itching and rash.  Neurological: Negative for dizziness, tingling, tremors, sensory change, speech change, focal weakness, seizures, loss of consciousness, weakness and headaches.  Endo/Heme/Allergies: Negative for environmental allergies and polydipsia. Does not bruise/bleed easily.  Psychiatric/Behavioral: Negative for depression, suicidal ideas, hallucinations, memory loss and substance abuse. The patient is not nervous/anxious and does not have insomnia.      PHYSICAL EXAM:  Blood pressure 120/82, pulse 81, height _0  (1.626 m), weight 167 lb (75.8 kg).    Vitals reviewed. Constitutional: She is oriented to person, place, and time. She appears well-developed and well-nourished.  HENT:  Head: Normocephalic and atraumatic.  Right Ear: External ear normal.  Left Ear: External ear normal.  Nose: Nose normal.  Mouth/Throat: Oropharynx is clear and moist.  Eyes: Conjunctivae and EOM are normal. Pupils are equal, round, and reactive to light. Right eye exhibits no discharge. Left eye exhibits no discharge. No scleral icterus.  Neck: Normal range of motion. Neck supple. No tracheal deviation present. No thyromegaly present.  Cardiovascular: Normal rate, regular rhythm, normal heart sounds and intact distal pulses.  Exam reveals no gallop and no friction rub.   No murmur heard. Respiratory: Effort normal and breath sounds normal. No respiratory distress. She has no wheezes. She has no rales. She exhibits no tenderness.  GI: Soft. Bowel sounds are normal. She  exhibits no distension and no mass. There is tenderness. There is no rebound and no guarding.  Genitourinary:       Vulva is normal without lesions Vagina is pink moist without discharge Cervix see colpo results Uterus is normal size, contour, position, consistency, mobility, non-tender Adnexa is negative with normal sized ovaries by sonogram  Musculoskeletal: Normal range of motion. She exhibits no edema and no tenderness.  Neurological: She is alert and oriented  to person, place, and time. She has normal reflexes. She displays normal reflexes. No cranial nerve deficit. She exhibits normal muscle tone. Coordination normal.  Skin: Skin is warm and dry. No rash noted. No erythema. No pallor.  Psychiatric: She has a normal mood and affect. Her behavior is normal. Judgment and thought content normal.    Labs: Results for orders placed or performed in visit on 02/07/17 (from the past 336 hour(s))  POCT urine pregnancy   Collection Time: 02/07/17 11:57 AM  Result Value Ref Range   Preg Test, Ur Negative Negative  Results for orders placed or performed in visit on 02/04/17 (from the past 336 hour(s))  POCT INR   Collection Time: 02/04/17  8:06 AM  Result Value Ref Range   INR 3.3     EKG: Orders placed or performed in visit on 05/23/16  . EKG 12-Lead    Imaging Studies: No results found.    Assessment: HSIL of the cervix, inadequate colposcopy with lesion extending into the endocervical canal Patient Active Problem List   Diagnosis Date Noted  . HSIL (high grade squamous intraepithelial lesion) on Pap smear of cervix 01/14/2017  . Encounter for gynecological examination with Papanicolaou smear of cervix 01/08/2017  . At risk for hemorrhage associated with liver biopsy 11/28/2016  . Elevated LFTs 10/04/2016  . Appendicitis with peritoneal abscess 05/23/2016  . Sepsis, unspecified organism ([place]) 05/22/2016  . AKI (acute kidney injury) ([place]) 05/22/2016  . Abdominal pain  05/21/2016  . Appendicitis with abscess 05/21/2016  . Volume depletion 05/21/2016  . History of breast cancer 05/21/2016  . Vaginal discharge 11/14/2015  . Vaginal burning 11/14/2015  . BV (bacterial vaginosis) 11/14/2015  . Abnormal Papanicolaou smear of cervix with positive human papilloma virus (HPV) test 08/17/2015  . History of abnormal cervical Pap smear 08/12/2015  . Primary hypothyroidism 12/07/2014  . Primary hypercoagulable state ([place]) 12/14/2013  . Encounter for monitoring cardiotoxic drug therapy 11/23/2013  . Gastroparesis 08/13/2013  . Screening for colorectal cancer 03/18/2013  . Dyspepsia 03/12/2013  . Dyspnea 11/14/2012  . Anticoagulation goal of INR 2.5 to 3.5   . Peripheral neuropathy   . B12 deficiency 08/21/2010  . 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 BRCA1 negative 08/16/2010  . BRCA2 negative 08/16/2010  . Long term current use of anticoagulant therapy 05/17/2010  . Malignant neoplasm of upper outer quadrant of female breast ([place]) 02/24/2010  . History of mitral valve replacement with mechanical valve 11/10/2009  . Systemic lupus erythematosus ([place]) 11/10/2009    Plan: Laser conization of the cervix for diagnosis and treatment  Florian Buff 02/07/2017 12:28 PM     Face to face time:  15 minutes  Greater than 50% of the visit time was spent in counseling and coordination of care with the patient.  The summary and outline of the counseling and care coordination is summarized in the note above.   All questions were answered.

## 2017-02-26 NOTE — Patient Instructions (Signed)
[name]  02/26/2017     @PREFPERIOPPHARMACY @   Your procedure is scheduled on  03/06/2017   Report to [facility] at  730   A.M.  Call this number if you have problems the morning of surgery:  [phone]   Remember:  Do not eat food or drink liquids after midnight.  Take these medicines the morning of surgery with A SIP OF WATER  Xanax, dexilant, neurontin, levothyroxine, reglan, zofran or phenergan, oxycodone, zantac.   Do not wear jewelry, make-up or nail polish.  Do not wear lotions, powders, or perfumes, or deodorant.  Do not shave 48 hours prior to surgery.  Men may shave face and neck.  Do not bring valuables to the hospital.  [facility] is not responsible for any belongings or valuables.  Contacts, dentures or bridgework may not be worn into surgery.  Leave your suitcase in the car.  After surgery it may be brought to your room.  For patients admitted to the hospital, discharge time will be determined by your treatment team.  Patients discharged the day of surgery will not be allowed to drive home.   Name and phone number of your driver:   family Special instructions:  None  Please read over the following fact sheets that you were given. Anesthesia Post-op Instructions and Care and Recovery After Surgery       Cervical Conization Cervical conization (cone biopsy) is a procedure in which a cone-shaped portion of the cervix is cut out so that it can be examined under a microscope. The procedure is done to check for cancer cells or cells that might turn into cancer (precancerous cells). You may have this procedure if:  You have abnormal bleeding from your cervix.  You had an abnormal Pap test.  Something abnormal was seen on your cervix during an exam.  This procedure is performed in either a health care provider's office or in an operating room. Tell a health care provider about:  Any allergies you have.  All medicines you are taking,  including vitamins, herbs, eye drops, creams, and over-the-counter medicines.  Any problems you or family members have had with the use of anesthetic medicines.  Any blood disorders you have.  Any surgeries you have had.  Any medical conditions you have.  Your smoking habits.  When you normally have your period.  Whether you are pregnant or may be pregnant. What are the risks? Generally, this is a safe procedure. However, problems may occur, including:  Heavy bleeding for several days or weeks after the procedure.  Allergic reactions to medicines or dyes.  Increased risk of preterm labor in future pregnancies.  Infection (rare).  Damage to the cervix or other structures or organs (rare).  What happens before the procedure? Staying hydrated Follow instructions from your health care provider about hydration, which may include:  Up to 2 hours before the procedure - you may continue to drink clear liquids, such as water, clear fruit juice, black coffee, and plain tea.  Eating and drinking restrictions Follow instructions from your health care provider about eating and drinking, which may include:  8 hours before the procedure - stop eating heavy meals or foods such as meat, fried foods, or fatty foods.  6 hours before the procedure - stop eating light meals or foods, such as toast or cereal.  6 hours before the procedure - stop drinking milk or drinks that contain milk.  2 hours before the procedure - stop drinking clear liquids.  General instructions  Do not douche, have sex, use tampons, or use any vaginal medicines before the procedure as told by your health care provider.  You may be asked to empty your bladder and bowel right before the procedure.  Ask your health care provider about: ? Changing or stopping your normal medicines. This is important if you take diabetes medicines or blood thinners. ? Taking medicines such as aspirin and ibuprofen. These medicines  can thin your blood. Do not take these medicines before your procedure if your doctor tells you not to.  Plan to have someone take you home from the hospital or clinic. What happens during the procedure?  To reduce your risk of infection: ? Your health care team will wash or sanitize their hands. ? Your skin will be washed with soap. ? Hair may be removed from the surgical area.  You will undress from the waist down and be given a gown to wear.  You will lie on an examining table and put your feet in stirrups.  An IV tube will be inserted into one of your veins.  You will be given one or more of the following: ? A medicine to help you relax (sedative). ? A medicine to numb the area (local anesthetic). ? A medicine to make you fall asleep (general anesthetic). ? A medicine that numbs the cervix (cervical block).  A lubricated device called a speculum will be inserted into your vagina. It will be used to spread open the walls of the vagina so your health care provider can see the inside of the vagina and cervix better.  An instrument that has a magnifying lens and a light (colposcope) will let your health care provider examine the cervix more closely.  Your health care provider will apply a solution to your cervix. This turns abnormal areas a pale color.  A tissue sample will be removed from the cervix using one of the following methods: ? The cold knife method. In this method, the tissue is cut out with a knife (scalpel). ? The loop electrosurgical excision procedure (LEEP) method. In this method, the tissue is cut out with a thin wire that can burn (cauterize) the tissue with an electrical current. ? Laser treatment method. In this method, the tissue is cut out and then cauterized with a laser beam to prevent bleeding.  Your health care provider will apply a paste over the biopsy areas to help control bleeding.  The tissue sample will be examined under a microscope. The procedure  may vary among health care providers and hospitals. What happens after the procedure?  Your blood pressure, heart rate, breathing rate, and blood oxygen level will be monitored often until the medicines you were given have worn off.  If you were given a local anesthetic, you will rest at the clinic or hospital until you are stable and feel ready to go home.  If you were given a general anesthetic, you may be monitored for a longer period of time.  You may have some cramping.  You may have bloody discharge or light to moderate bleeding.  You may have dark discharge coming from your vagina. This is from the paste used on the cervix to prevent bleeding. Summary  Cervical conization is a procedure in which a cone-shaped portion of the cervix is cut out so that it can be examined under a microscope.  The procedure is done to check for  cancer cells or cells that might turn into cancer (precancerous cells). This information is not intended to replace advice given to you by your health care provider. Make sure you discuss any questions you have with your health care provider. Document Released: 11/15/2004 Document Revised: 02/08/2016 Document Reviewed: 02/08/2016 Elsevier Interactive Patient Education  2017 [employer]  Cervical Conization, Care After This sheet gives you information about how to care for yourself after your procedure. Your doctor may also give you more specific instructions. If you have problems or questions, contact your doctor. Follow these instructions at home: Medicines  Take over-the-counter and prescription medicines only as told by your doctor.  Do not take aspirin until your doctor says it is okay.  If you take pain medicine: ? You may have constipation. To help treat this, your doctor may tell you to:  Drink enough fluid to keep your pee (urine) clear or pale yellow.  Take medicines.  Eat foods that are high in fiber. These include fresh fruits and  vegetables, whole grains, bran, and beans.  Limit foods that are high in fat and sugar. These include fried foods and sweet foods. ? Do not drive or use heavy machines. General instructions  You can eat your usual diet unless your doctor tells you not to do so.  Take showers for the first week. Do not take baths, swim, or use hot tubs until your doctor says it is okay.  Do not douche, use tampons, or have sex until your doctor says it is okay.  For 7-14 days after your procedure, avoid: ? Being very active. ? Exercising. ? Heavy lifting.  Keep all follow-up visits as told by your doctor. This is important. Contact a doctor if:  You have a rash.  You are dizzy or lightheaded.  You feel sick to your stomach (nauseous).  You throw up (vomit).  You have fluid from your vagina (vaginal discharge) that smells bad. Get help right away if:  There are blood clots coming from your vagina.  You have more bleeding than you would have in a normal period. For example, you soak a pad in less than 1 hour.  You have a fever.  You have more and more cramps.  You pass out (faint).  You have pain when peeing.  Your have a lot of pain.  Your pain gets worse.  Your pain does not get better when you take your medicine.  You have blood in your pee.  You throw up (vomit). Summary  After your procedure, take over-the-counter and prescription medicines only as told by your doctor.  Do not douche, use tampons, or have sex until your doctor says it is okay.  For about 7-14 days after your procedure, try not to exercise or lift heavy objects.  Get help right away if you have new symptoms, or if your symptoms become worse. This information is not intended to replace advice given to you by your health care provider. Make sure you discuss any questions you have with your health care provider. Document Released: 11/15/2007 Document Revised: 02/08/2016 Document Reviewed:  02/08/2016 Elsevier Interactive Patient Education  2017 [employer] Anesthesia, Adult General anesthesia is the use of medicines to make a person "go to sleep" (be unconscious) for a medical procedure. General anesthesia is often recommended when a procedure:  Is long.  Requires you to be still or in an unusual position.  Is major and can cause you to lose blood.  Is impossible  to do without general anesthesia.  The medicines used for general anesthesia are called general anesthetics. In addition to making you sleep, the medicines:  Prevent pain.  Control your blood pressure.  Relax your muscles.  Tell a health care provider about:  Any allergies you have.  All medicines you are taking, including vitamins, herbs, eye drops, creams, and over-the-counter medicines.  Any problems you or family members have had with anesthetic medicines.  Types of anesthetics you have had in the past.  Any bleeding disorders you have.  Any surgeries you have had.  Any medical conditions you have.  Any history of heart or lung conditions, such as heart failure, sleep apnea, or chronic obstructive pulmonary disease (COPD).  Whether you are pregnant or may be pregnant.  Whether you use tobacco, alcohol, marijuana, or street drugs.  Any history of Armed forces logistics/support/administrative officer.  Any history of depression or anxiety. What are the risks? Generally, this is a safe procedure. However, problems may occur, including:  Allergic reaction to anesthetics.  Lung and heart problems.  Inhaling food or liquids from your stomach into your lungs (aspiration).  Injury to nerves.  Waking up during your procedure and being unable to move (rare).  Extreme agitation or a state of mental confusion (delirium) when you wake up from the anesthetic.  Air in the bloodstream, which can lead to stroke.  These problems are more likely to develop if you are having a major surgery or if you have an advanced  medical condition. You can prevent some of these complications by answering all of your health care provider's questions thoroughly and by following all pre-procedure instructions. General anesthesia can cause side effects, including:  Nausea or vomiting  A sore throat from the breathing tube.  Feeling cold or shivery.  Feeling tired, washed out, or achy.  Sleepiness or drowsiness.  Confusion or agitation.  What happens before the procedure? Staying hydrated Follow instructions from your health care provider about hydration, which may include:  Up to 2 hours before the procedure - you may continue to drink clear liquids, such as water, clear fruit juice, black coffee, and plain tea.  Eating and drinking restrictions Follow instructions from your health care provider about eating and drinking, which may include:  8 hours before the procedure - stop eating heavy meals or foods such as meat, fried foods, or fatty foods.  6 hours before the procedure - stop eating light meals or foods, such as toast or cereal.  6 hours before the procedure - stop drinking milk or drinks that contain milk.  2 hours before the procedure - stop drinking clear liquids.  Medicines  Ask your health care provider about: ? Changing or stopping your regular medicines. This is especially important if you are taking diabetes medicines or blood thinners. ? Taking medicines such as aspirin and ibuprofen. These medicines can thin your blood. Do not take these medicines before your procedure if your health care provider instructs you not to. ? Taking new dietary supplements or medicines. Do not take these during the week before your procedure unless your health care provider approves them.  If you are told to take a medicine or to continue taking a medicine on the day of the procedure, take the medicine with sips of water. General instructions   Ask if you will be going home the same day, the following day,  or after a longer hospital stay. ? Plan to have someone take you home. ? Plan to have  someone stay with you for the first 24 hours after you leave the hospital or clinic.  For 3-6 weeks before the procedure, try not to use any tobacco products, such as cigarettes, chewing tobacco, and e-cigarettes.  You may brush your teeth on the morning of the procedure, but make sure to spit out the toothpaste. What happens during the procedure?  You will be given anesthetics through a mask and through an IV tube in one of your veins.  You may receive medicine to help you relax (sedative).  As soon as you are asleep, a breathing tube may be used to help you breathe.  An anesthesia specialist will stay with you throughout the procedure. He or she will help keep you comfortable and safe by continuing to give you medicines and adjusting the amount of medicine that you get. He or she will also watch your blood pressure, pulse, and oxygen levels to make sure that the anesthetics do not cause any problems.  If a breathing tube was used to help you breathe, it will be removed before you wake up. The procedure may vary among health care providers and hospitals. What happens after the procedure?  You will wake up, often slowly, after the procedure is complete, usually in a recovery area.  Your blood pressure, heart rate, breathing rate, and blood oxygen level will be monitored until the medicines you were given have worn off.  You may be given medicine to help you calm down if you feel anxious or agitated.  If you will be going home the same day, your health care provider may check to make sure you can stand, drink, and urinate.  Your health care providers will treat your pain and side effects before you go home.  Do not drive for 24 hours if you received a sedative.  You may: ? Feel nauseous and vomit. ? Have a sore throat. ? Have mental slowness. ? Feel cold or shivery. ? Feel sleepy. ? Feel  tired. ? Feel sore or achy, even in parts of your body where you did not have surgery. This information is not intended to replace advice given to you by your health care provider. Make sure you discuss any questions you have with your health care provider. Document Released: 05/15/2007 Document Revised: 07/19/2015 Document Reviewed: 01/20/2015 Elsevier Interactive Patient Education  2018 [employer] Anesthesia, Adult, Care After These instructions provide you with information about caring for yourself after your procedure. Your health care provider may also give you more specific instructions. Your treatment has been planned according to current medical practices, but problems sometimes occur. Call your health care provider if you have any problems or questions after your procedure. What can I expect after the procedure? After the procedure, it is common to have:  Vomiting.  A sore throat.  Mental slowness.  It is common to feel:  Nauseous.  Cold or shivery.  Sleepy.  Tired.  Sore or achy, even in parts of your body where you did not have surgery.  Follow these instructions at home: For at least 24 hours after the procedure:  Do not: ? Participate in activities where you could fall or become injured. ? Drive. ? Use heavy machinery. ? Drink alcohol. ? Take sleeping pills or medicines that cause drowsiness. ? Make important decisions or sign legal documents. ? Take care of children on your own.  Rest. Eating and drinking  If you vomit, drink water, juice, or soup when you can drink without  vomiting.  Drink enough fluid to keep your urine clear or pale yellow.  Make sure you have little or no nausea before eating solid foods.  Follow the diet recommended by your health care provider. General instructions  Have a responsible adult stay with you until you are awake and alert.  Return to your normal activities as told by your health care provider. Ask your  health care provider what activities are safe for you.  Take over-the-counter and prescription medicines only as told by your health care provider.  If you smoke, do not smoke without supervision.  Keep all follow-up visits as told by your health care provider. This is important. Contact a health care provider if:  You continue to have nausea or vomiting at home, and medicines are not helpful.  You cannot drink fluids or start eating again.  You cannot urinate after 8-12 hours.  You develop a skin rash.  You have fever.  You have increasing redness at the site of your procedure. Get help right away if:  You have difficulty breathing.  You have chest pain.  You have unexpected bleeding.  You feel that you are having a life-threatening or urgent problem. This information is not intended to replace advice given to you by your health care provider. Make sure you discuss any questions you have with your health care provider. Document Released: 05/14/2000 Document Revised: 07/11/2015 Document Reviewed: 01/20/2015 Elsevier Interactive Patient Education  [employer].

## 2017-02-27 DIAGNOSIS — E568 Deficiency of other vitamins: Secondary | ICD-10-CM | POA: Diagnosis not present

## 2017-02-27 DIAGNOSIS — Z6828 Body mass index (BMI) 28.0-28.9, adult: Secondary | ICD-10-CM | POA: Diagnosis not present

## 2017-02-27 DIAGNOSIS — E538 Deficiency of other specified B group vitamins: Secondary | ICD-10-CM | POA: Diagnosis not present

## 2017-02-27 DIAGNOSIS — R221 Localized swelling, mass and lump, neck: Secondary | ICD-10-CM | POA: Diagnosis not present

## 2017-02-27 DIAGNOSIS — Z1389 Encounter for screening for other disorder: Secondary | ICD-10-CM | POA: Diagnosis not present

## 2017-02-27 DIAGNOSIS — E663 Overweight: Secondary | ICD-10-CM | POA: Diagnosis not present

## 2017-02-28 ENCOUNTER — Other Ambulatory Visit: Payer: Self-pay | Admitting: Cardiology

## 2017-03-01 ENCOUNTER — Encounter (HOSPITAL_COMMUNITY): Payer: Self-pay

## 2017-03-01 ENCOUNTER — Other Ambulatory Visit: Payer: Self-pay

## 2017-03-01 ENCOUNTER — Other Ambulatory Visit: Payer: Self-pay | Admitting: Obstetrics & Gynecology

## 2017-03-01 ENCOUNTER — Encounter (HOSPITAL_COMMUNITY)
Admission: RE | Admit: 2017-03-01 | Discharge: 2017-03-01 | Disposition: A | Discharge status: Home / Self Care | Payer: BLUE CROSS/BLUE SHIELD | Source: Ambulatory Visit | Attending: Obstetrics & Gynecology | Admitting: Obstetrics & Gynecology

## 2017-03-01 DIAGNOSIS — D591 Other autoimmune hemolytic anemias: Secondary | ICD-10-CM | POA: Insufficient documentation

## 2017-03-01 DIAGNOSIS — Z01812 Encounter for preprocedural laboratory examination: Secondary | ICD-10-CM | POA: Diagnosis not present

## 2017-03-01 DIAGNOSIS — N760 Acute vaginitis: Secondary | ICD-10-CM | POA: Diagnosis not present

## 2017-03-01 DIAGNOSIS — A419 Sepsis, unspecified organism: Secondary | ICD-10-CM | POA: Insufficient documentation

## 2017-03-01 DIAGNOSIS — Z853 Personal history of malignant neoplasm of breast: Secondary | ICD-10-CM | POA: Insufficient documentation

## 2017-03-01 DIAGNOSIS — G629 Polyneuropathy, unspecified: Secondary | ICD-10-CM | POA: Insufficient documentation

## 2017-03-01 DIAGNOSIS — M329 Systemic lupus erythematosus, unspecified: Secondary | ICD-10-CM | POA: Diagnosis not present

## 2017-03-01 DIAGNOSIS — Z9189 Other specified personal risk factors, not elsewhere classified: Secondary | ICD-10-CM | POA: Diagnosis not present

## 2017-03-01 DIAGNOSIS — Z7901 Long term (current) use of anticoagulants: Secondary | ICD-10-CM | POA: Diagnosis not present

## 2017-03-01 DIAGNOSIS — R945 Abnormal results of liver function studies: Secondary | ICD-10-CM | POA: Insufficient documentation

## 2017-03-01 DIAGNOSIS — R87613 High grade squamous intraepithelial lesion on cytologic smear of cervix (HGSIL): Secondary | ICD-10-CM | POA: Diagnosis not present

## 2017-03-01 DIAGNOSIS — E538 Deficiency of other specified B group vitamins: Secondary | ICD-10-CM | POA: Diagnosis not present

## 2017-03-01 DIAGNOSIS — N179 Acute kidney failure, unspecified: Secondary | ICD-10-CM | POA: Insufficient documentation

## 2017-03-01 DIAGNOSIS — B9689 Other specified bacterial agents as the cause of diseases classified elsewhere: Secondary | ICD-10-CM | POA: Insufficient documentation

## 2017-03-01 HISTORY — DX: Raynaud's syndrome without gangrene: I73.00

## 2017-03-01 LAB — COMPREHENSIVE METABOLIC PANEL
ALT: 15 U/L (ref 14–54)
AST: 18 U/L (ref 15–41)
Albumin: 4.3 g/dL (ref 3.5–5.0)
Alkaline Phosphatase: 79 U/L (ref 38–126)
Anion gap: 10 (ref 5–15)
BILIRUBIN TOTAL: 0.6 mg/dL (ref 0.3–1.2)
BUN: 10 mg/dL (ref 6–20)
CHLORIDE: 106 mmol/L (ref 101–111)
CO2: 22 mmol/L (ref 22–32)
Calcium: 9.2 mg/dL (ref 8.9–10.3)
Creatinine, Ser: 0.69 mg/dL (ref 0.44–1.00)
Glucose, Bld: 84 mg/dL (ref 65–99)
Potassium: 4 mmol/L (ref 3.5–5.1)
Sodium: 138 mmol/L (ref 135–145)
TOTAL PROTEIN: 7.5 g/dL (ref 6.5–8.1)

## 2017-03-01 LAB — TYPE AND SCREEN
ABO/RH(D): O POS
ANTIBODY SCREEN: NEGATIVE

## 2017-03-01 LAB — CBC
HEMATOCRIT: 38.2 % (ref 36.0–46.0)
Hemoglobin: 12.7 g/dL (ref 12.0–15.0)
MCH: 30.2 pg (ref 26.0–34.0)
MCHC: 33.2 g/dL (ref 30.0–36.0)
MCV: 90.7 fL (ref 78.0–100.0)
PLATELETS: 206 10*3/uL (ref 150–400)
RBC: 4.21 MIL/uL (ref 3.87–5.11)
RDW: 14 % (ref 11.5–15.5)
WBC: 5.5 10*3/uL (ref 4.0–10.5)

## 2017-03-06 ENCOUNTER — Ambulatory Visit (HOSPITAL_COMMUNITY): Payer: BLUE CROSS/BLUE SHIELD | Admitting: Anesthesiology

## 2017-03-06 ENCOUNTER — Encounter (HOSPITAL_COMMUNITY)
Admission: RE | Disposition: A | Discharge status: Home / Self Care | Payer: Self-pay | Source: Ambulatory Visit | Attending: Obstetrics & Gynecology

## 2017-03-06 ENCOUNTER — Encounter (HOSPITAL_COMMUNITY): Payer: Self-pay | Admitting: *Deleted

## 2017-03-06 ENCOUNTER — Ambulatory Visit (HOSPITAL_COMMUNITY)
Admission: RE | Admit: 2017-03-06 | Discharge: 2017-03-06 | Disposition: A | Discharge status: Home / Self Care | Payer: BLUE CROSS/BLUE SHIELD | Source: Ambulatory Visit | Attending: Obstetrics & Gynecology | Admitting: Obstetrics & Gynecology

## 2017-03-06 DIAGNOSIS — I7300 Raynaud's syndrome without gangrene: Secondary | ICD-10-CM | POA: Diagnosis not present

## 2017-03-06 DIAGNOSIS — N871 Moderate cervical dysplasia: Secondary | ICD-10-CM | POA: Diagnosis not present

## 2017-03-06 DIAGNOSIS — E039 Hypothyroidism, unspecified: Secondary | ICD-10-CM | POA: Diagnosis not present

## 2017-03-06 DIAGNOSIS — D069 Carcinoma in situ of cervix, unspecified: Secondary | ICD-10-CM | POA: Diagnosis not present

## 2017-03-06 DIAGNOSIS — Z7901 Long term (current) use of anticoagulants: Secondary | ICD-10-CM | POA: Insufficient documentation

## 2017-03-06 DIAGNOSIS — Z79899 Other long term (current) drug therapy: Secondary | ICD-10-CM | POA: Insufficient documentation

## 2017-03-06 DIAGNOSIS — R87613 High grade squamous intraepithelial lesion on cytologic smear of cervix (HGSIL): Secondary | ICD-10-CM | POA: Diagnosis not present

## 2017-03-06 DIAGNOSIS — F419 Anxiety disorder, unspecified: Secondary | ICD-10-CM | POA: Diagnosis not present

## 2017-03-06 DIAGNOSIS — M797 Fibromyalgia: Secondary | ICD-10-CM | POA: Insufficient documentation

## 2017-03-06 DIAGNOSIS — Z952 Presence of prosthetic heart valve: Secondary | ICD-10-CM | POA: Diagnosis not present

## 2017-03-06 DIAGNOSIS — E538 Deficiency of other specified B group vitamins: Secondary | ICD-10-CM | POA: Insufficient documentation

## 2017-03-06 DIAGNOSIS — Z87891 Personal history of nicotine dependence: Secondary | ICD-10-CM | POA: Insufficient documentation

## 2017-03-06 DIAGNOSIS — K219 Gastro-esophageal reflux disease without esophagitis: Secondary | ICD-10-CM | POA: Diagnosis not present

## 2017-03-06 DIAGNOSIS — G629 Polyneuropathy, unspecified: Secondary | ICD-10-CM | POA: Insufficient documentation

## 2017-03-06 DIAGNOSIS — M329 Systemic lupus erythematosus, unspecified: Secondary | ICD-10-CM | POA: Diagnosis not present

## 2017-03-06 DIAGNOSIS — I509 Heart failure, unspecified: Secondary | ICD-10-CM | POA: Diagnosis not present

## 2017-03-06 HISTORY — PX: CERVICAL CONIZATION W/BX: SHX1330

## 2017-03-06 LAB — PREGNANCY, URINE: PREG TEST UR: NEGATIVE

## 2017-03-06 SURGERY — CONE BIOPSY, CERVIX
Anesthesia: General | Site: Cervix

## 2017-03-06 MED ORDER — SCOPOLAMINE 1 MG/3DAYS TD PT72
MEDICATED_PATCH | TRANSDERMAL | Status: AC
  Filled 2017-03-06: qty 1

## 2017-03-06 MED ORDER — MIDAZOLAM HCL 2 MG/2ML IJ SOLN
INTRAMUSCULAR | Status: AC
  Filled 2017-03-06: qty 2

## 2017-03-06 MED ORDER — ONDANSETRON 8 MG PO TBDP: 8.0000 mg | ORAL_TABLET | Freq: Three times a day (TID) | ORAL | 0 refills | Status: DC | PRN

## 2017-03-06 MED ORDER — MIDAZOLAM HCL 2 MG/2ML IJ SOLN
1.0000 mg | INTRAMUSCULAR | Status: AC
  Administered 2017-03-06 (×2): 2 mg via INTRAVENOUS
  Filled 2017-03-06: qty 2

## 2017-03-06 MED ORDER — ACETIC ACID 5 % SOLN
Status: DC | PRN
  Administered 2017-03-06: 1 via TOPICAL

## 2017-03-06 MED ORDER — CEFAZOLIN SODIUM-DEXTROSE 2-4 GM/100ML-% IV SOLN
INTRAVENOUS | Status: AC
  Filled 2017-03-06: qty 100

## 2017-03-06 MED ORDER — CEFAZOLIN SODIUM-DEXTROSE 2-4 GM/100ML-% IV SOLN
2.0000 g | INTRAVENOUS | Status: AC
  Administered 2017-03-06: 2 g via INTRAVENOUS

## 2017-03-06 MED ORDER — DEXAMETHASONE SODIUM PHOSPHATE 4 MG/ML IJ SOLN
4.0000 mg | Freq: Once | INTRAMUSCULAR | Status: AC
  Administered 2017-03-06: 4 mg via INTRAVENOUS
  Filled 2017-03-06: qty 1

## 2017-03-06 MED ORDER — PROPOFOL 10 MG/ML IV BOLUS
INTRAVENOUS | Status: AC
  Filled 2017-03-06: qty 20

## 2017-03-06 MED ORDER — SCOPOLAMINE 1 MG/3DAYS TD PT72
1.0000 | MEDICATED_PATCH | Freq: Once | TRANSDERMAL | Status: DC
  Administered 2017-03-06: 1.5 mg via TRANSDERMAL

## 2017-03-06 MED ORDER — LACTATED RINGERS IV SOLN
INTRAVENOUS | Status: DC
  Administered 2017-03-06: 11:00:00 via INTRAVENOUS

## 2017-03-06 MED ORDER — FENTANYL CITRATE (PF) 100 MCG/2ML IJ SOLN
INTRAMUSCULAR | Status: AC
  Filled 2017-03-06: qty 2

## 2017-03-06 MED ORDER — OXYCODONE-ACETAMINOPHEN 5-325 MG PO TABS: 1.0000 | ORAL_TABLET | ORAL | 0 refills | Status: DC | PRN

## 2017-03-06 MED ORDER — PROPOFOL 10 MG/ML IV BOLUS
INTRAVENOUS | Status: DC | PRN
  Administered 2017-03-06: 200 mg via INTRAVENOUS

## 2017-03-06 MED ORDER — KETOROLAC TROMETHAMINE 10 MG PO TABS: 10.0000 mg | ORAL_TABLET | Freq: Three times a day (TID) | ORAL | 0 refills | Status: DC | PRN

## 2017-03-06 MED ORDER — KETOROLAC TROMETHAMINE 30 MG/ML IJ SOLN
INTRAMUSCULAR | Status: AC
  Filled 2017-03-06: qty 1

## 2017-03-06 MED ORDER — FERRIC SUBSULFATE 259 MG/GM EX SOLN
CUTANEOUS | Status: AC
  Filled 2017-03-06: qty 8

## 2017-03-06 MED ORDER — FENTANYL CITRATE (PF) 100 MCG/2ML IJ SOLN
INTRAMUSCULAR | Status: DC | PRN
  Administered 2017-03-06: 100 ug via INTRAVENOUS

## 2017-03-06 MED ORDER — FERRIC SUBSULFATE 259 MG/GM EX SOLN
CUTANEOUS | Status: DC | PRN
Start: 2017-03-06 — End: 2017-03-06
  Administered 2017-03-06: 1 via TOPICAL

## 2017-03-06 MED ORDER — SODIUM CHLORIDE 0.9% FLUSH
INTRAVENOUS | Status: AC
  Filled 2017-03-06: qty 10

## 2017-03-06 MED ORDER — ONDANSETRON HCL 4 MG/2ML IJ SOLN
INTRAMUSCULAR | Status: AC
  Filled 2017-03-06: qty 2

## 2017-03-06 MED ORDER — FERRIC SUBSULFATE SOLN
Status: DC | PRN
  Administered 2017-03-06: 1 via TOPICAL

## 2017-03-06 MED ORDER — KETOROLAC TROMETHAMINE 30 MG/ML IJ SOLN
30.0000 mg | Freq: Once | INTRAMUSCULAR | Status: AC
  Administered 2017-03-06: 30 mg via INTRAVENOUS

## 2017-03-06 MED ORDER — FENTANYL CITRATE (PF) 100 MCG/2ML IJ SOLN
25.0000 ug | INTRAMUSCULAR | Status: DC | PRN
  Administered 2017-03-06: 50 ug via INTRAVENOUS
  Filled 2017-03-06: qty 2

## 2017-03-06 MED ORDER — ONDANSETRON HCL 4 MG/2ML IJ SOLN
4.0000 mg | Freq: Once | INTRAMUSCULAR | Status: AC
  Administered 2017-03-06: 4 mg via INTRAVENOUS

## 2017-03-06 SURGICAL SUPPLY — 32 items
APPLICATOR COTTON TIP 6IN STRL (MISCELLANEOUS) ×1 IMPLANT
CLOTH BEACON ORANGE TIMEOUT ST (SAFETY) ×2 IMPLANT
COAGULATOR SUCT SWTCH 10FR 6 (ELECTROSURGICAL) ×1 IMPLANT
COVER LIGHT HANDLE STERIS (MISCELLANEOUS) ×2 IMPLANT
ELECT REM PT RETURN 9FT ADLT (ELECTROSURGICAL) ×2
ELECTRODE REM PT RTRN 9FT ADLT (ELECTROSURGICAL) ×1 IMPLANT
FORMALIN 10 PREFIL 120ML (MISCELLANEOUS) ×2 IMPLANT
GAUZE SPONGE 4X4 16PLY XRAY LF (GAUZE/BANDAGES/DRESSINGS) ×1 IMPLANT
GLOVE BIO SURGEON STRL SZ7 (GLOVE) ×1 IMPLANT
GLOVE BIOGEL PI IND STRL 6.5 (GLOVE) IMPLANT
GLOVE BIOGEL PI IND STRL 7.0 (GLOVE) ×1 IMPLANT
GLOVE BIOGEL PI IND STRL 8 (GLOVE) ×1 IMPLANT
GLOVE BIOGEL PI INDICATOR 6.5 (GLOVE) ×1
GLOVE BIOGEL PI INDICATOR 7.0 (GLOVE) ×1
GLOVE BIOGEL PI INDICATOR 8 (GLOVE) ×1
GLOVE ECLIPSE 8.0 STRL XLNG CF (GLOVE) ×2 IMPLANT
GOWN STRL REUS W/TWL LRG LVL3 (GOWN DISPOSABLE) ×2 IMPLANT
GOWN STRL REUS W/TWL XL LVL3 (GOWN DISPOSABLE) ×2 IMPLANT
KIT ROOM TURNOVER APOR (KITS) ×2 IMPLANT
LASER FIBER DISP 1000U (UROLOGICAL SUPPLIES) ×2 IMPLANT
MANIFOLD NEPTUNE II (INSTRUMENTS) ×2 IMPLANT
MARKER SKIN DUAL TIP RULER LAB (MISCELLANEOUS) ×2 IMPLANT
PACK BASIC III (CUSTOM PROCEDURE TRAY) ×2
PACK SRG BSC III STRL LF ECLPS (CUSTOM PROCEDURE TRAY) ×1 IMPLANT
PAD ARMBOARD 7.5X6 YLW CONV (MISCELLANEOUS) ×2 IMPLANT
PREFILTER SMOKE EVAC (FILTER) ×2 IMPLANT
SCOPETTES 8  STERILE (MISCELLANEOUS) ×1
SCOPETTES 8 STERILE (MISCELLANEOUS) ×1 IMPLANT
SET BASIN LINEN APH (SET/KITS/TRAYS/PACK) ×2 IMPLANT
TOWEL OR 17X26 4PK STRL BLUE (TOWEL DISPOSABLE) ×2 IMPLANT
TUBING SMOKE EVAC CO2 (TUBING) ×2 IMPLANT
WATER STERILE IRR 1000ML POUR (IV SOLUTION) ×2 IMPLANT

## 2017-03-06 NOTE — Anesthesia Preprocedure Evaluation (Signed)
Anesthesia Evaluation  Patient identified by MRN, date of birth, ID band Patient awake    Reviewed: Allergy & Precautions, H&P , NPO status , Patient's Chart, lab work & pertinent test results  History of Anesthesia Complications (+) PONV and history of anesthetic complications  Airway Mallampati: I  TM Distance: >3 FB Neck ROM: Full    Dental  (+) Teeth Intact   Pulmonary shortness of breath, former smoker,    breath sounds clear to auscultation       Cardiovascular + Peripheral Vascular Disease ( Raynaud disease) and +CHF  + Valvular Problems/Murmurs ( MVR) MR  Rhythm:Regular Rate:Normal + Systolic Click 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)   Neuro/Psych PSYCHIATRIC DISORDERS Anxiety  Neuromuscular disease    GI/Hepatic Neg liver ROS, GERD  Medicated,(+) Hepatitis -  Endo/Other  negative endocrine ROSHypothyroidism   Renal/GU negative Renal ROS  negative genitourinary   Musculoskeletal  (+) Fibromyalgia -  Abdominal (+) + obese,   Peds negative pediatric ROS (+)  Hematology negative hematology ROS (+)   Anesthesia Other Findings   Reproductive/Obstetrics negative OB ROS                             Anesthesia Physical Anesthesia Plan  ASA: III  Anesthesia Plan: General   Post-op Pain Management:    Induction: Intravenous  PONV Risk Score and Plan:   Airway Management Planned: LMA  Additional Equipment:   Intra-op Plan:   Post-operative Plan: Extubation in OR  Informed Consent: I have reviewed the patients History and Physical, chart, labs and discussed the procedure including the risks, benefits and alternatives for the proposed anesthesia with the patient or authorized representative who has indicated his/her understanding and acceptance.     Plan Discussed with:   Anesthesia Plan Comments:         Anesthesia Quick Evaluation

## 2017-03-06 NOTE — Anesthesia Postprocedure Evaluation (Signed)
Anesthesia Post Note  Patient: [name]  Procedure(s) Performed: Laser Conization of Cervix (N/A Cervix)  Patient location during evaluation: PACU Anesthesia Type: General Level of consciousness: awake and alert and oriented Pain management: pain level controlled Vital Signs Assessment: post-procedure vital signs reviewed and stable Respiratory status: spontaneous breathing, nonlabored ventilation and respiratory function stable Cardiovascular status: blood pressure returned to baseline Postop Assessment: no apparent nausea or vomiting Anesthetic complications: no     Last Vitals:  Vitals:   03/06/17 1215 03/06/17 1315  BP:  116/72  Pulse:  73  Resp: (!) 23 12  Temp:  (P) 36.7 C  SpO2:  100%    Last Pain:  Vitals:   03/06/17 0921  TempSrc: Oral                 [name]

## 2017-03-06 NOTE — H&P (Signed)
Preoperative History and Physical  [name] is a 47 y.o. [id number] with No LMP recorded. Patient is not currently having periods (Reason: Other). admitted for a laser conization of the cervix for HSIL on pap with lesion seen and extending into the endocervical canal.  Will need laser conization for treatment  Colposcopy Procedure Note:  Colposcopy Procedure Note  Indications: Pap smear 1 months ago showed: high-grade squamous intraepithelial neoplasia  (HGSIL-encompassing moderate and severe dysplasia). The prior pap showed ASCUS with POSITIVE high risk HPV.  Prior cervical/vaginal disease: no exam was done. Prior cervical treatment: no treatment.  Smoker:  No. New sexual partner:  No.    History of abnormal Pap: yes  Procedure Details  The risks and benefits of the procedure and Written informed consent obtained.  Speculum placed in vagina and excellent visualization of cervix achieved, cervix swabbed x 3 with acetic acid solution.  Findings: Cervix: visible lesion(s) at 3 o'clock, punctation noted at 3 o'clock and mosaicism noted at 3 o'clock; SCJ visualized - lesion at 3 o'clock and lesion extending into the endocervical canal Vaginal inspection: normal without visible lesions. Vulvar colposcopy: vulvar colposcopy not performed.  Specimens: cervical biopsy  Complications: none.  Plan:Proceed with laser conization for therapeutic and diagnostic indications    PMH:        Past Medical History:  Diagnosis Date  . Abnormal Papanicolaou smear of cervix with positive human papilloma virus (HPV) test 08/17/2015  . Anticoagulation goal of INR 2.5 to 3.5   . Anxiety   . B12 deficiency   . BRCA1 negative   . BRCA2 negative   . Breast cancer, left breast ([facility]) 2011   S/P mastectomy; chemo; "no radiation due to lupus"  . Bursitis of right knee    Septic bursitis  . CHF (congestive heart failure) ([place])   . Chronic lower back pain   . Drug-induced  hepatitis    States per her rheumatologist, transaminases elevated but normalized after drug removed for   . Fibromyalgia   . GERD (gastroesophageal reflux disease)   . 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 History of abnormal cervical Pap smear 08/12/2015  . History of blood transfusion    "when I had my heart OR; maybe again when I was dealing w/breast cancer" (11/28/2016)  . HSIL (high grade squamous intraepithelial lesion) on Pap smear of cervix 01/14/2017   colpo with Dr Elonda Husky   . Hypothyroidism   . Mitral valve disease, rheumatic    [employer]. [employer] prosthesis  . Peripheral neuropathy   . Pneumonia   . PONV (postoperative nausea and vomiting)   . Raynaud disease   . SLE (systemic lupus erythematosus) ([facility])     PSH:          Past Surgical History:  Procedure Laterality Date  . APPENDECTOMY N/A 05/23/2016   Procedure: OPEN APPENDECTOMY WITH DRAINAGE OF ABSCESS;  Surgeon: Fanny Skates, MD;  Location: [facility];  Service: General;  Laterality: N/A;  . APPENDECTOMY    . BREAST BIOPSY Left 2012  . BREAST IMPLANT EXCHANGE Left 12/2015  . CARDIAC CATHETERIZATION  2008  . CARDIAC VALVE REPLACEMENT    . ENDOMETRIAL ABLATION  2008   She no longer has menses  . ESOPHAGOGASTRODUODENOSCOPY N/A 03/25/2013   Dr. Raliegh Scarlet reflux esophagitis-likely source of patient's symptoms (patulous EG junction). Hiatal hernia otherwise normal  . LAPAROSCOPIC CHOLECYSTECTOMY    . LIVER BIOPSY  11/28/2016   liver core/notes 11/28/2016  . MASTECTOMY Left  2012  . MASTOPEXY  02/14/2011   Procedure: MASTOPEXY;  Surgeon: Macon Large;  Location: [facility];  Service: Plastics;  Laterality: Bilateral;  Right Breast Reduction   . MITRAL VALVE REPLACEMENT  2008  . TISSUE EXPANDER PLACEMENT  02/14/2011   Procedure: TISSUE EXPANDER;  Surgeon: Macon Large;  Location: [facility];  Service: Plastics;  Laterality: Bilateral;  Left Breast Remove Tissue Expander  Placement of Implant Breast Reconstruction  . TISSUE EXPANDER PLACEMENT Left 11/16/2010   breasst  . TUBAL LIGATION      POb/GynH:              OB History    Gravida Para Term Preterm AB Living   2 2 0     2   SAB TAB Ectopic Multiple Live Births           2      SH:   Social History        Tobacco Use  . Smoking status: Former Smoker    Packs/day: 1.00    Years: 15.00    Pack years: 15.00    Types: Cigarettes    Last attempt to quit: 02/19/2006    Years since quitting: 10.9  . Smokeless tobacco: Never Used  Substance Use Topics  . Alcohol use: Yes    Alcohol/week: 0.0 oz    Comment: 11/28/2016 "couple drinks/yearZ"  . Drug use: No    FH:         Family History  Problem Relation Age of Onset  . Hypertension Mother   . Hyperlipidemia Mother   . Hypertension Father   . Colon cancer Neg Hx      Allergies:       Allergies  Allergen Reactions  . Methotrexate Derivatives   . 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 kph    Medications:       Current Outpatient Medications:  .  ALPRAZolam (XANAX) 0.5 MG tablet, Take 0.5 mg by mouth 3 (three) times daily as needed for anxiety or sleep. , Disp: , Rfl:  .  cyanocobalamin (,VITAMIN B-12,) 1000 MCG/ML injection, Inject 1 mL (1,000 mcg total) into the muscle every 30 (thirty) days., Disp: 10 mL, Rfl: prn .  DEXILANT 60 MG capsule, TAKE ONE CAPSULE BY MOUTH DAILY. (Patient taking differently: TAKE 60 MG BY MOUTH DAILY.), Disp: 90 capsule, Rfl: 3 .  furosemide (LASIX) 40 MG tablet, Take 1 tablet by mouth daily., Disp: , Rfl:  .  gabapentin (NEURONTIN) 600 MG tablet, Take 600 mg by mouth 3 (three) times daily. , Disp: , Rfl:  .  ibuprofen (ADVIL,MOTRIN) 800 MG tablet, Take 800 mg by mouth every 6 (six) hours as needed for headache. Reported on 08/30/2015, Disp: , Rfl:  .   levothyroxine (SYNTHROID, LEVOTHROID) 100 MCG tablet, Take 1 tablet (100 mcg total) by mouth daily., Disp: 90 tablet, Rfl: 4 .  metoCLOPramide (REGLAN) 5 MG tablet, Take 1 tablet (5 mg total) by mouth 2 (two) times daily., Disp: 180 tablet, Rfl: 3 .  ondansetron (ZOFRAN) 4 MG tablet, Take 1 tablet (4 mg total) by mouth every 8 (eight) hours as needed for nausea or vomiting., Disp: 60 tablet, Rfl: 3 .  oxyCODONE-acetaminophen (PERCOCET/ROXICET) 5-325 MG tablet, Take 1-2 tablets by mouth every 4 (four) hours as needed for moderate pain or severe pain. , Disp: , Rfl:  .  potassium chloride (KLOR-CON 10) 10 MEQ tablet, Take 10 mEq by mouth daily. Take one tablet daily as directed, Disp: , Rfl:  .  promethazine (PHENERGAN) 12.5 MG tablet, Take 1 tablet (12.5 mg total) by mouth every 4 (four) hours as needed for nausea or vomiting., Disp: 20 tablet, Rfl: 0 .  ranitidine (ZANTAC) 150 MG tablet, Take 1 tablet (150 mg total) by mouth at bedtime., Disp: 90 tablet, Rfl: 3 .  riTUXimab (RITUXAN) 100 MG/10ML injection, Inject into the vein. Every 4 months, Disp: , Rfl:  .  rOPINIRole (REQUIP) 0.5 MG tablet, Take 1 mg by mouth at bedtime. , Disp: , Rfl:  .  warfarin (COUMADIN) 5 MG tablet, Take 1 1/2 tablets daily except 2 tablets on Tuesdays (Patient taking differently: Take 7.5-10 mg by mouth See admin instructions. 7.62m Mon, Wed, Fri, Sun - 183mTues, Thurs, Sat), Disp: 60 tablet, Rfl: 6 .  zolpidem (AMBIEN) 10 MG tablet, Take 10 mg by mouth at bedtime. , Disp: , Rfl:  .  enoxaparin (LOVENOX) 80 MG/0.8ML injection, Inject 0.8 mLs (80 mg total) into the skin every 12 (twelve) hours., Disp: 20 Syringe, Rfl: 0  Review of Systems:   Review of Systems  Constitutional: Negative for fever, chills, weight loss, malaise/fatigue and diaphoresis.  HENT: Negative for hearing loss, ear pain, nosebleeds, congestion, sore throat, neck pain, tinnitus and ear discharge.   Eyes: Negative for blurred vision, double vision,  photophobia, pain, discharge and redness.  Respiratory: Negative for cough, hemoptysis, sputum production, shortness of breath, wheezing and stridor.   Cardiovascular: Negative for chest pain, palpitations, orthopnea, claudication, leg swelling and PND.  Gastrointestinal: Positive for abdominal pain. Negative for heartburn, nausea, vomiting, diarrhea, constipation, blood in stool and melena.  Genitourinary: Negative for dysuria, urgency, frequency, hematuria and flank pain.  Musculoskeletal: Negative for myalgias, back pain, joint pain and falls.  Skin: Negative for itching and rash.  Neurological: Negative for dizziness, tingling, tremors, sensory change, speech change, focal weakness, seizures, loss of consciousness, weakness and headaches.  Endo/Heme/Allergies: Negative for environmental allergies and polydipsia. Does not bruise/bleed easily.  Psychiatric/Behavioral: Negative for depression, suicidal ideas, hallucinations, memory loss and substance abuse. The patient is not nervous/anxious and does not have insomnia.      PHYSICAL EXAM:  Blood pressure 120/82, pulse 81, height _0  (1.626 m), weight 167 lb (75.8 kg).    Vitals reviewed. Constitutional: She is oriented to person, place, and time. She appears well-developed and well-nourished.  HENT:  Head: Normocephalic and atraumatic.  Right Ear: External ear normal.  Left Ear: External ear normal.  Nose: Nose normal.  Mouth/Throat: Oropharynx is clear and moist.  Eyes: Conjunctivae and EOM are normal. Pupils are equal, round, and reactive to light. Right eye exhibits no discharge. Left eye exhibits no discharge. No scleral icterus.  Neck: Normal range of motion. Neck supple. No tracheal deviation present. No thyromegaly present.  Cardiovascular: Normal rate, regular rhythm, normal heart sounds and intact distal pulses.  Exam reveals no gallop and no friction rub.   No murmur heard. Respiratory: Effort normal and breath sounds  normal. No respiratory distress. She has no wheezes. She has no rales. She exhibits no tenderness.  GI: Soft. Bowel sounds are normal. She exhibits no distension and no mass. There is tenderness. There is no rebound and no guarding.  Genitourinary:       Vulva is normal without lesions Vagina is pink moist without discharge Cervix see colpo results Uterus is normal size, contour, position,  consistency, mobility, non-tender Adnexa is negative with normal sized ovaries by sonogram  Musculoskeletal: Normal range of motion. She exhibits no edema and no tenderness.  Neurological: She is alert and oriented to person, place, and time. She has normal reflexes. She displays normal reflexes. No cranial nerve deficit. She exhibits normal muscle tone. Coordination normal.  Skin: Skin is warm and dry. No rash noted. No erythema. No pallor.  Psychiatric: She has a normal mood and affect. Her behavior is normal. Judgment and thought content normal.    Labs:      Results for orders placed or performed in visit on 02/07/17 (from the past 336 hour(s))  POCT urine pregnancy   Collection Time: 02/07/17 11:57 AM  Result Value Ref Range   Preg Test, Ur Negative Negative  Results for orders placed or performed in visit on 02/04/17 (from the past 336 hour(s))  POCT INR   Collection Time: 02/04/17  8:06 AM  Result Value Ref Range   INR 3.3     EKG:    Orders placed or performed in visit on 05/23/16  . EKG 12-Lead    Imaging Studies: ImagingResults  No results found.      Assessment: HSIL of the cervix, inadequate colposcopy with lesion extending into the endocervical canal     Patient Active Problem List   Diagnosis Date Noted  . HSIL (high grade squamous intraepithelial lesion) on Pap smear of cervix 01/14/2017  . Encounter for gynecological examination with Papanicolaou smear of cervix 01/08/2017  . At risk for hemorrhage associated with liver biopsy 11/28/2016  . Elevated  LFTs 10/04/2016  . Appendicitis with peritoneal abscess 05/23/2016  . Sepsis, unspecified organism ([place]) 05/22/2016  . AKI (acute kidney injury) ([place]) 05/22/2016  . Abdominal pain 05/21/2016  . Appendicitis with abscess 05/21/2016  . Volume depletion 05/21/2016  . History of breast cancer 05/21/2016  . Vaginal discharge 11/14/2015  . Vaginal burning 11/14/2015  . BV (bacterial vaginosis) 11/14/2015  . Abnormal Papanicolaou smear of cervix with positive human papilloma virus (HPV) test 08/17/2015  . History of abnormal cervical Pap smear 08/12/2015  . Primary hypothyroidism 12/07/2014  . Primary hypercoagulable state ([place]) 12/14/2013  . Encounter for monitoring cardiotoxic drug therapy 11/23/2013  . Gastroparesis 08/13/2013  . Screening for colorectal cancer 03/18/2013  . Dyspepsia 03/12/2013  . Dyspnea 11/14/2012  . Anticoagulation goal of INR 2.5 to 3.5   . Peripheral neuropathy   . B12 deficiency 08/21/2010  . 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) 08/21/2010  . BRCA1 negative 08/16/2010  . BRCA2 negative 08/16/2010  . Long term current use of anticoagulant therapy 05/17/2010  . Malignant neoplasm of upper outer quadrant of female breast ([place]) 02/24/2010  . History of mitral valve replacement with mechanical valve 11/10/2009  . Systemic lupus erythematosus ([place]) 11/10/2009    Plan: Laser conization of the cervix for diagnosis and treatment  Florian Buff 02/07/2017 12:28 PM

## 2017-03-06 NOTE — Op Note (Signed)
Preoperative diagnosis:  1. HSIL of cervix with inadeuqate colposcopy                                         2.  Lesion extending into endocervical canal  Postoperative Diagnosis:  Same as above  Procedure:  Cervical conization using laser,  ablation of cervical bed using laser  Surgeon:  Jacelyn Grip MD  Anesthesia:  Laryngeal mask airway  Findings:   Today at the time of surgery a repeat colposcopy was performed using 3% acetic acid and the lesion was once again confirmed.  There  were no new findings today.  Description of operation:  Patient was taken to the operating room and placed in the supine position where she underwent laryngeal mask airway anesthesia.  She was then placed in the high lithotomy position using candy cane stirrups.  She was then draped out for a laser procedure.  The microscope was used and 3% acetic acid was placed on the cervix.  The holmium laser was then employed at a power of 2.5 and rates between 8 and 15.  I achieved a couple millimeter margin around lesions both at 12:00 and 6:00 the laser was used to perform a conization.  The specimen was removed and sent to pathology for evaluation.  As is always the case with laser I did achieve at an appropriate margin around the disease with shrinkage of the tissue during the procedure it may appear to be a positive lateral margin.  However the surgical margin is indeed clear.  I then used the laser to ablate the conization bed to a depth of 5-7 mm laterally coning down to 9 mm centrally and began getting good surgical margin.  Additional hemostasis was achieved using Monsel solution.  In the conization bed was completely hemostatic.  Blood loss for the procedure was none.  The patient received ancef and toradol preoperatively.  The patient was awakened from anesthesia and taken to the recovery room in good stable condition with all counts being correct.  She will be followed up in the office in one week for evaluation of  the conization bed and placement of more Monsel's.  Florian Buff, MD 03/06/2017 1:01 PM

## 2017-03-06 NOTE — Discharge Instructions (Signed)
Cervical Laser Surgery, Care After This sheet gives you information about how to care for yourself after your procedure. Your health care provider may also give you more specific instructions. If you have problems or questions, contact your health care provider. What can I expect after the procedure? After the procedure, it is common to have:  Pain or discomfort.  Mild cramping.  Bleeding, spotting, or brownish discharge from your vagina.  Follow these instructions at home: Activity  Return to your normal activities as told by your health care provider. Ask your health care provider what activities are safe for you.  Do not lift anything that is heavier than 10 lb (4.5 kg), or the limit that your health care provider tells you, until he or she says that it is safe.  Do not have sex or put anything in your vagina until your health care provider says it is okay. General instructions  Take over-the-counter and prescription medicines only as told by your health care provider.  Do not drive or use heavy machinery while taking prescription pain medicine.  Wear sanitary pads to protect from bleeding, spotting, and discharge.  Do not use tampons or douche until your health care provider says it is okay.  It is up to you to get the results of your procedure. Ask your health care provider, or the department that is doing the procedure, when your results will be ready.  Keep all follow-up visits as told by your health care provider. This is important. Contact a health care provider if:  Your pain or cramping does not improve.  Your periods are more painful than usual.  You do not get your period as expected. Get help right away if:  You have any symptoms of infection, such as: ? A fever. ? Chills. ? Discharge that smells bad.  You have severe pain in your abdomen.  You have heavy bleeding from your vagina (more than a normal period).  You have vaginal bleeding with clumps of  blood (blood clots). Summary  After this procedure, it is common to have pain or discomfort and mild cramping. It is also common to have bleeding, spotting, or brownish discharge from your vagina.  You may need to wear sanitary pads to protect from bleeding, spotting, and discharge.  Do not have sex, use tampons, or douche until your health care provider says it is okay.  Return to your normal activities as told by your health care provider. Ask your health care provider what activities are safe for you.  Take over-the-counter and prescription medicines only as told by your health care provider. These include medicines for pain. This information is not intended to replace advice given to you by your health care provider. Make sure you discuss any questions you have with your health care provider. Document Released: 12/26/2015 Document Revised: 12/26/2015 Document Reviewed: 12/26/2015 Elsevier Interactive Patient Education  [employer].

## 2017-03-06 NOTE — Progress Notes (Signed)
Peripad scant serosanguinous drainage. No odor.

## 2017-03-06 NOTE — Anesthesia Procedure Notes (Signed)
Procedure Name: LMA Insertion Date/Time: 03/06/2017 12:26 PM Performed by: Jonna Munro, CRNA Pre-anesthesia Checklist: Patient identified, Emergency Drugs available, Suction available, Patient being monitored and Timeout performed Patient Re-evaluated:Patient Re-evaluated prior to induction Oxygen Delivery Method: Circle system utilized Preoxygenation: Pre-oxygenation with 100% oxygen Induction Type: IV induction LMA: LMA inserted LMA Size: 4.0 Number of attempts: 1 Placement Confirmation: positive ETCO2 and breath sounds checked- equal and bilateral Tube secured with: Tape Dental Injury: Teeth and Oropharynx as per pre-operative assessment

## 2017-03-06 NOTE — Transfer of Care (Signed)
Immediate Anesthesia Transfer of Care Note  Patient: [name]  Procedure(s) Performed: Laser Conization of Cervix (N/A Cervix)  Patient Location: PACU  Anesthesia Type:General  Level of Consciousness: awake, oriented and patient cooperative  Airway & Oxygen Therapy: Patient Spontanous Breathing and Patient connected to face mask oxygen  Post-op Assessment: Report given to RN, Post -op Vital signs reviewed and stable and Patient moving all extremities  Post vital signs: Reviewed and stable  Last Vitals:  Vitals:   03/06/17 1205 03/06/17 1215  BP: 126/77   Pulse:    Resp: 17 (!) 23  Temp:    SpO2: 97%     Last Pain:  Vitals:   03/06/17 0921  TempSrc: Oral         Complications: No apparent anesthesia complications

## 2017-03-07 ENCOUNTER — Encounter (HOSPITAL_COMMUNITY): Payer: Self-pay | Admitting: Obstetrics & Gynecology

## 2017-03-08 ENCOUNTER — Encounter: Payer: Self-pay | Admitting: Obstetrics & Gynecology

## 2017-03-13 ENCOUNTER — Ambulatory Visit (INDEPENDENT_AMBULATORY_CARE_PROVIDER_SITE_OTHER): Payer: BLUE CROSS/BLUE SHIELD | Admitting: *Deleted

## 2017-03-13 DIAGNOSIS — Z5181 Encounter for therapeutic drug level monitoring: Secondary | ICD-10-CM | POA: Diagnosis not present

## 2017-03-13 DIAGNOSIS — Z952 Presence of prosthetic heart valve: Secondary | ICD-10-CM

## 2017-03-13 DIAGNOSIS — Z1212 Encounter for screening for malignant neoplasm of rectum: Principal | ICD-10-CM

## 2017-03-13 DIAGNOSIS — Z1211 Encounter for screening for malignant neoplasm of colon: Secondary | ICD-10-CM

## 2017-03-13 DIAGNOSIS — D6859 Other primary thrombophilia: Secondary | ICD-10-CM

## 2017-03-13 LAB — POCT INR: INR: 2.7

## 2017-03-13 NOTE — Patient Instructions (Signed)
Continue coumadin 1 1/2 tablets daily except 2 tablets on Tuesdays, Thursdays and Saturdays Recheck in 3 weeks

## 2017-03-14 ENCOUNTER — Ambulatory Visit (INDEPENDENT_AMBULATORY_CARE_PROVIDER_SITE_OTHER): Payer: Medicare Other | Admitting: Obstetrics & Gynecology

## 2017-03-14 ENCOUNTER — Encounter: Payer: Self-pay | Admitting: Obstetrics & Gynecology

## 2017-03-14 VITALS — BP 122/82 | HR 77 | Ht 64.0 in | Wt 163.0 lb

## 2017-03-14 DIAGNOSIS — Z9889 Other specified postprocedural states: Secondary | ICD-10-CM

## 2017-03-14 DIAGNOSIS — R87613 High grade squamous intraepithelial lesion on cytologic smear of cervix (HGSIL): Secondary | ICD-10-CM

## 2017-03-14 NOTE — Progress Notes (Signed)
  HPI: Patient returns for routine postoperative follow-up having undergone  on laser conization of the cervix for HSIL.  The patient's immediate postoperative recovery has been unremarkable. Since hospital discharge the patient reports discharge no bleeding.   Current Outpatient Medications: ALPRAZolam (XANAX) 0.5 MG tablet, Take 0.5 mg by mouth daily as needed for anxiety or sleep. , Disp: , Rfl:  cyanocobalamin (,VITAMIN B-12,) 1000 MCG/ML injection, Inject 1 mL (1,000 mcg total) into the muscle every 30 (thirty) days., Disp: 10 mL, Rfl: prn DEXILANT 60 MG capsule, TAKE ONE CAPSULE BY MOUTH DAILY. (Patient taking differently: TAKE 60 MG BY MOUTH DAILY.), Disp: 90 capsule, Rfl: 3 furosemide (LASIX) 40 MG tablet, Take 1 tablet by mouth daily., Disp: , Rfl:  gabapentin (NEURONTIN) 600 MG tablet, Take 600 mg by mouth 2 (two) times daily. , Disp: , Rfl:  ibuprofen (ADVIL,MOTRIN) 800 MG tablet, Take 800 mg by mouth 2 (two) times daily as needed for mild pain or moderate pain. Reported on 08/30/2015, Disp: , Rfl:  levothyroxine (SYNTHROID, LEVOTHROID) 100 MCG tablet, Take 1 tablet (100 mcg total) by mouth daily., Disp: 90 tablet, Rfl: 4 metoCLOPramide (REGLAN) 5 MG tablet, Take 1 tablet (5 mg total) by mouth 2 (two) times daily., Disp: 180 tablet, Rfl: 3 ondansetron (ZOFRAN ODT) 8 MG disintegrating tablet, Take 1 tablet (8 mg total) by mouth every 8 (eight) hours as needed for nausea or vomiting., Disp: 20 tablet, Rfl: 0 ondansetron (ZOFRAN) 4 MG tablet, Take 1 tablet (4 mg total) by mouth every 8 (eight) hours as needed for nausea or vomiting., Disp: 60 tablet, Rfl: 3 oxyCODONE-acetaminophen (PERCOCET/ROXICET) 5-325 MG tablet, Take 1-2 tablets by mouth every 4 (four) hours as needed for severe pain., Disp: 30 tablet, Rfl: 0 potassium chloride (KLOR-CON 10) 10 MEQ tablet, Take 10 mEq by mouth every evening. Take one tablet daily as directed, Disp: , Rfl:  promethazine (PHENERGAN) 12.5 MG tablet, Take 1  tablet (12.5 mg total) by mouth every 4 (four) hours as needed for nausea or vomiting., Disp: 20 tablet, Rfl: 0 ranitidine (ZANTAC) 150 MG tablet, Take 1 tablet (150 mg total) by mouth at bedtime., Disp: 90 tablet, Rfl: 3 riTUXimab (RITUXAN) 100 MG/10ML injection, Inject into the vein. Every 4 months, Disp: , Rfl:  rOPINIRole (REQUIP) 1 MG tablet, Take 1 mg by mouth at bedtime., Disp: , Rfl:  warfarin (COUMADIN) 5 MG tablet, TAKE 1 & 1/2 TABLETS BY MOUTH DAILY EXCEPT TAKE 2 TABLETS ON TUESDAYS, THURSDAYS AND SATURDAYS, Disp: 60 tablet, Rfl: 6 zolpidem (AMBIEN) 10 MG tablet, Take 10 mg by mouth at bedtime. , Disp: , Rfl:  enoxaparin (LOVENOX) 80 MG/0.8ML injection, Inject 0.8 mLs (80 mg total) into the skin every 12 (twelve) hours. (Patient not taking: Reported on 03/14/2017), Disp: 20 Syringe, Rfl: 0 ketorolac (TORADOL) 10 MG tablet, Take 1 tablet (10 mg total) by mouth every 8 (eight) hours as needed. (Patient not taking: Reported on 03/14/2017), Disp: 15 tablet, Rfl: 0  No current facility-administered medications for this visit.     Blood pressure 122/82, pulse 77, height 5\' 4"  (1.626 m), weight 163 lb (73.9 kg).  Physical Exam: Normal post laser cervix  Diagnostic Tests:   Pathology: +HSIL with clear margins  Impression: S/p laser conization of the cervix  Plan:   Follow up: 6  months  Pap smear  Florian Buff, MD

## 2017-03-16 ENCOUNTER — Emergency Department (HOSPITAL_COMMUNITY)
Admission: EM | Admit: 2017-03-16 | Discharge: 2017-03-17 | Disposition: A | Discharge status: Home / Self Care | Payer: BLUE CROSS/BLUE SHIELD | Attending: Emergency Medicine | Admitting: Emergency Medicine

## 2017-03-16 ENCOUNTER — Encounter (HOSPITAL_COMMUNITY): Payer: Self-pay | Admitting: *Deleted

## 2017-03-16 ENCOUNTER — Other Ambulatory Visit: Payer: Self-pay

## 2017-03-16 DIAGNOSIS — Z79899 Other long term (current) drug therapy: Secondary | ICD-10-CM | POA: Insufficient documentation

## 2017-03-16 DIAGNOSIS — I509 Heart failure, unspecified: Secondary | ICD-10-CM | POA: Diagnosis not present

## 2017-03-16 DIAGNOSIS — N938 Other specified abnormal uterine and vaginal bleeding: Secondary | ICD-10-CM | POA: Diagnosis not present

## 2017-03-16 DIAGNOSIS — Z87891 Personal history of nicotine dependence: Secondary | ICD-10-CM | POA: Diagnosis not present

## 2017-03-16 DIAGNOSIS — N939 Abnormal uterine and vaginal bleeding, unspecified: Secondary | ICD-10-CM | POA: Diagnosis not present

## 2017-03-16 DIAGNOSIS — E039 Hypothyroidism, unspecified: Secondary | ICD-10-CM | POA: Diagnosis not present

## 2017-03-16 DIAGNOSIS — N888 Other specified noninflammatory disorders of cervix uteri: Secondary | ICD-10-CM

## 2017-03-16 DIAGNOSIS — Y69 Unspecified misadventure during surgical and medical care: Secondary | ICD-10-CM | POA: Diagnosis not present

## 2017-03-16 DIAGNOSIS — T888XXA Other specified complications of surgical and medical care, not elsewhere classified, initial encounter: Secondary | ICD-10-CM | POA: Insufficient documentation

## 2017-03-16 DIAGNOSIS — M3210 Systemic lupus erythematosus, organ or system involvement unspecified: Secondary | ICD-10-CM | POA: Diagnosis not present

## 2017-03-16 LAB — CBC WITH DIFFERENTIAL/PLATELET
Basophils Absolute: 0 10*3/uL (ref 0.0–0.1)
Basophils Relative: 1 %
EOS ABS: 0.2 10*3/uL (ref 0.0–0.7)
EOS PCT: 3 %
HCT: 36.1 % (ref 36.0–46.0)
Hemoglobin: 11.9 g/dL — ABNORMAL LOW (ref 12.0–15.0)
LYMPHS ABS: 1.2 10*3/uL (ref 0.7–4.0)
Lymphocytes Relative: 14 %
MCH: 29.9 pg (ref 26.0–34.0)
MCHC: 33 g/dL (ref 30.0–36.0)
MCV: 90.7 fL (ref 78.0–100.0)
MONO ABS: 0.6 10*3/uL (ref 0.1–1.0)
MONOS PCT: 7 %
Neutro Abs: 6.3 10*3/uL (ref 1.7–7.7)
Neutrophils Relative %: 75 %
PLATELETS: 214 10*3/uL (ref 150–400)
RBC: 3.98 MIL/uL (ref 3.87–5.11)
RDW: 13.1 % (ref 11.5–15.5)
WBC: 8.2 10*3/uL (ref 4.0–10.5)

## 2017-03-16 NOTE — ED Triage Notes (Signed)
Pt had a laser conization of the cervix on January 16th and pt has had a charcoaled colored discharge ever since (which she was told was normal by Dr. Elonda Husky) but this afternoon pt noticed bright red blood that got increasingly worse with large clots. Pt was told by Dr. Elonda Husky to call him if she started having bright red bleeding. Pt called the nurse line and was told if she wanted to be seen by Dr. Elonda Husky or have the EDP contact him, that she needed to come to the ED.

## 2017-03-16 NOTE — ED Notes (Signed)
Lab paged

## 2017-03-16 NOTE — ED Provider Notes (Signed)
[facility] EMERGENCY DEPARTMENT Provider Note   CSN: [id number] Arrival date & time: 03/16/17  2307     History   Chief Complaint Chief Complaint  Patient presents with  . Vaginal Bleeding    HPI [name] is a 47 y.o. female.  HPI  47 year old female presents with vaginal bleeding starting today.  On 1/16 she had a cervical ablation with a laser by Dr. Elonda Husky.  She has not had any bleeding since and saw him in follow-up 2 days ago.  Around noon she is had some on and off bleeding but since around 830 she has had a heavier bleeding with clots.  She is gone through 3 pads since then.  She denies feeling weak, dizzy, short of breath, or having chest pain.  She denies any significant abdominal pain.  She is on warfarin for a mitral valve replacement.  Past Medical History:  Diagnosis Date  . Abnormal Papanicolaou smear of cervix with positive human papilloma virus (HPV) test 08/17/2015  . Anticoagulation goal of INR 2.5 to 3.5   . Anxiety   . B12 deficiency   . BRCA1 negative   . BRCA2 negative   . Breast cancer, left breast ([place]) 2011   S/P mastectomy; chemo; "no radiation due to lupus"  . Bursitis of right knee    Septic bursitis  . CHF (congestive heart failure) ([place])   . Chronic lower back pain   . Drug-induced hepatitis    States per her rheumatologist, transaminases elevated but normalized after drug removed for   . Fibromyalgia   . GERD (gastroesophageal reflux disease)   . 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 History of abnormal cervical Pap smear 08/12/2015  . History of blood transfusion    "when I had my heart OR; maybe again when I was dealing w/breast cancer" (11/28/2016)  . HSIL (high grade squamous intraepithelial lesion) on Pap smear of cervix 01/14/2017   colpo with Dr Elonda Husky   . Hypothyroidism   . Lupus   . Mitral valve disease, rheumatic    St. Jude prosthesis  . Peripheral neuropathy   . Pneumonia   . PONV  (postoperative nausea and vomiting)   . Raynaud disease   . Raynaud phenomenon   . SLE (systemic lupus erythematosus) ([place])     Patient Active Problem List   Diagnosis Date Noted  . HSIL (high grade squamous intraepithelial lesion) on Pap smear of cervix 01/14/2017  . Encounter for gynecological examination with Papanicolaou smear of cervix 01/08/2017  . At risk for hemorrhage associated with liver biopsy 11/28/2016  . Elevated LFTs 10/04/2016  . Appendicitis with peritoneal abscess 05/23/2016  . Sepsis, unspecified organism ([place]) 05/22/2016  . AKI (acute kidney injury) ([place]) 05/22/2016  . Abdominal pain 05/21/2016  . Appendicitis with abscess 05/21/2016  . Volume depletion 05/21/2016  . History of breast cancer 05/21/2016  . Vaginal discharge 11/14/2015  . Vaginal burning 11/14/2015  . BV (bacterial vaginosis) 11/14/2015  . Abnormal Papanicolaou smear of cervix with positive human papilloma virus (HPV) test 08/17/2015  . History of abnormal cervical Pap smear 08/12/2015  . Primary hypothyroidism 12/07/2014  . Primary hypercoagulable state ([place]) 12/14/2013  . Encounter for monitoring cardiotoxic drug therapy 11/23/2013  . Gastroparesis 08/13/2013  . Screening for colorectal cancer 03/18/2013  . Dyspepsia 03/12/2013  . Dyspnea 11/14/2012  . Anticoagulation goal of INR 2.5 to 3.5   . Peripheral neuropathy   . B12 deficiency 08/21/2010  . Hemolytic  anemia associated with systemic lupus erythematosus ([place]) 08/21/2010  . BRCA1 negative 08/16/2010  . BRCA2 negative 08/16/2010  . Long term current use of anticoagulant therapy 05/17/2010  . Malignant neoplasm of upper outer quadrant of female breast ([place]) 02/24/2010  . History of mitral valve replacement with mechanical valve 11/10/2009  . Systemic lupus erythematosus ([place]) 11/10/2009    Past Surgical History:  Procedure Laterality Date  . APPENDECTOMY N/A 05/23/2016   Procedure: OPEN APPENDECTOMY WITH DRAINAGE OF ABSCESS;   Surgeon: Fanny Skates, MD;  Location: [place];  Service: General;  Laterality: N/A;  . APPENDECTOMY    . BREAST BIOPSY Left 2012  . BREAST IMPLANT EXCHANGE Left 12/2015  . CARDIAC CATHETERIZATION  2008  . CARDIAC VALVE REPLACEMENT    . CERVICAL CONIZATION W/BX N/A 03/06/2017   Procedure: Laser Conization of Cervix;  Surgeon: Florian Buff, MD;  Location: [facility];  Service: Gynecology;  Laterality: N/A;  . ENDOMETRIAL ABLATION  2008   She no longer has menses  . ESOPHAGOGASTRODUODENOSCOPY N/A 03/25/2013   Dr. Raliegh Scarlet reflux esophagitis-likely source of patient's symptoms (patulous EG junction). Hiatal hernia otherwise normal  . LAPAROSCOPIC CHOLECYSTECTOMY    . LIVER BIOPSY  11/28/2016   liver core/notes 11/28/2016  . MASTECTOMY Left 2012  . MASTOPEXY  02/14/2011   Procedure: MASTOPEXY;  Surgeon: Macon Large;  Location: [place];  Service: Plastics;  Laterality: Bilateral;  Right Breast Reduction   . MITRAL VALVE REPLACEMENT  2008  . TISSUE EXPANDER PLACEMENT  02/14/2011   Procedure: TISSUE EXPANDER;  Surgeon: Macon Large;  Location: [place];  Service: Plastics;  Laterality: Bilateral;  Left Breast Remove Tissue Expander Placement of Implant Breast Reconstruction  . TISSUE EXPANDER PLACEMENT Left 11/16/2010   breasst  . TUBAL LIGATION      OB History    Gravida Para Term Preterm AB Living   2 2 0     2   SAB TAB Ectopic Multiple Live Births           2       Home Medications    Prior to Admission medications   Medication Sig Start Date End Date Taking? Authorizing Provider  ALPRAZolam Duanne Moron) 0.5 MG tablet Take 0.5 mg by mouth daily as needed for anxiety or sleep.  11/02/13   [provider]  cyanocobalamin (,VITAMIN B-12,) 1000 MCG/ML injection Inject 1 mL (1,000 mcg total) into the muscle every 30 (thirty) days. 12/19/10   Everardo All, MD  DEXILANT 60 MG capsule TAKE ONE CAPSULE BY MOUTH DAILY. Patient taking differently: TAKE 60 MG BY MOUTH DAILY. 07/13/16    Mahala Menghini, PA-C  enoxaparin (LOVENOX) 80 MG/0.8ML injection Inject 0.8 mLs (80 mg total) into the skin every 12 (twelve) hours. Patient not taking: Reported on 03/14/2017 02/07/17   Florian Buff, MD  furosemide (LASIX) 40 MG tablet Take 1 tablet by mouth daily.    [provider]  gabapentin (NEURONTIN) 600 MG tablet Take 600 mg by mouth 2 (two) times daily.     [provider]  ibuprofen (ADVIL,MOTRIN) 800 MG tablet Take 800 mg by mouth 2 (two) times daily as needed for mild pain or moderate pain. Reported on 08/30/2015    [provider]  ketorolac (TORADOL) 10 MG tablet Take 1 tablet (10 mg total) by mouth every 8 (eight) hours as needed. Patient not taking: Reported on 03/14/2017 03/06/17   Florian Buff, MD  levothyroxine (SYNTHROID, LEVOTHROID) 100 MCG tablet Take  1 tablet (100 mcg total) by mouth daily. 06/27/16   Cassandria Anger, MD  metoCLOPramide (REGLAN) 5 MG tablet Take 1 tablet (5 mg total) by mouth 2 (two) times daily. 10/04/16   Annitta Needs, NP  ondansetron (ZOFRAN ODT) 8 MG disintegrating tablet Take 1 tablet (8 mg total) by mouth every 8 (eight) hours as needed for nausea or vomiting. 03/06/17   Florian Buff, MD  ondansetron (ZOFRAN) 4 MG tablet Take 1 tablet (4 mg total) by mouth every 8 (eight) hours as needed for nausea or vomiting. 01/16/17   Annitta Needs, NP  oxyCODONE-acetaminophen (PERCOCET/ROXICET) 5-325 MG tablet Take 1-2 tablets by mouth every 4 (four) hours as needed for severe pain. 03/06/17   Florian Buff, MD  potassium chloride (KLOR-CON 10) 10 MEQ tablet Take 10 mEq by mouth every evening. Take one tablet daily as directed 11/09/13   [provider]  promethazine (PHENERGAN) 12.5 MG tablet Take 1 tablet (12.5 mg total) by mouth every 4 (four) hours as needed for nausea or vomiting. 05/30/16   Rolm Bookbinder, MD  ranitidine (ZANTAC) 150 MG tablet Take 1 tablet (150 mg total) by mouth at bedtime. 10/04/16   Annitta Needs,  NP  riTUXimab (RITUXAN) 100 MG/10ML injection Inject into the vein. Every 4 months    [provider]  rOPINIRole (REQUIP) 1 MG tablet Take 1 mg by mouth at bedtime.    [provider]  warfarin (COUMADIN) 5 MG tablet TAKE 1 & 1/2 TABLETS BY MOUTH DAILY EXCEPT TAKE 2 TABLETS ON Julien Girt AND SATURDAYS 02/28/17   Satira Sark, MD  zolpidem (AMBIEN) 10 MG tablet Take 10 mg by mouth at bedtime.  12/19/10   de Stanford Scotland, MD    Family History Family History  Problem Relation Age of Onset  . Hypertension Mother   . Hyperlipidemia Mother   . Hypertension Father   . Colon cancer Neg Hx     Social History Social History   Tobacco Use  . Smoking status: Former Smoker    Packs/day: 1.00    Years: 15.00    Pack years: 15.00    Types: Cigarettes    Last attempt to quit: 02/19/2006    Years since quitting: 11.0  . Smokeless tobacco: Never Used  Substance Use Topics  . Alcohol use: Yes    Alcohol/week: 0.0 oz    Comment: 11/28/2016 "couple drinks/yearZ"  . Drug use: No     Allergies   Methotrexate derivatives and Other   Review of Systems Review of Systems  Respiratory: Negative for shortness of breath.   Cardiovascular: Negative for chest pain.  Gastrointestinal: Negative for abdominal pain.  Genitourinary: Positive for vaginal bleeding.  Neurological: Negative for weakness and light-headedness.  All other systems reviewed and are negative.    Physical Exam Updated Vital Signs BP 101/61 (BP Location: Right Arm)   Pulse 89   Temp 99.1 F (37.3 C) (Oral)   Resp 16   Ht 5' 4"  (1.626 m)   Wt 73.9 kg (163 lb)   SpO2 100%   BMI 27.98 kg/m   Physical Exam  Constitutional: She is oriented to person, place, and time. She appears well-developed and well-nourished. No distress.  HENT:  Head: Normocephalic and atraumatic.  Right Ear: External ear normal.  Left Ear: External ear normal.  Nose: Nose normal.  Eyes: Right eye exhibits no  discharge. Left eye exhibits no discharge.  Cardiovascular: Normal rate, regular rhythm and  normal heart sounds.  Pulmonary/Chest: Effort normal and breath sounds normal.  Abdominal: Soft. She exhibits no distension. There is no tenderness.  Genitourinary: There is bleeding in the vagina.  Genitourinary Comments: Difficult to get a good exam of the cervix given the degree of bleeding and clots in her vagina.  There does appear to be some bleeding from the 2 o'clock position of the cervix.  Neurological: She is alert and oriented to person, place, and time.  Skin: Skin is warm and dry. She is not diaphoretic.  Nursing note and vitals reviewed.    ED Treatments / Results  Labs (all labs ordered are listed, but only abnormal results are displayed) Labs Reviewed  BASIC METABOLIC PANEL - Abnormal; Notable for the following components:      Result Value   Potassium 3.4 (*)    Glucose, Bld 107 (*)    All other components within normal limits  PROTIME-INR - Abnormal; Notable for the following components:   Prothrombin Time 25.6 (*)    All other components within normal limits  CBC WITH DIFFERENTIAL/PLATELET - Abnormal; Notable for the following components:   Hemoglobin 11.9 (*)    All other components within normal limits    EKG  EKG Interpretation None       Radiology No results found.  Procedures Vaginal packing Date/Time: 03/17/2017 5:00 AM Performed by: Sherwood Gambler, MD Authorized by: Sherwood Gambler, MD  Consent: Verbal consent obtained. Risks and benefits: risks, benefits and alternatives were discussed Consent given by: patient Required items: required blood products, implants, devices, and special equipment available Patient identity confirmed: verbally with patient Local anesthesia used: no  Anesthesia: Local anesthesia used: no  Sedation: Patient sedated: no  Patient tolerance: Patient tolerated the procedure well with no immediate complications Comments:  1 2x2 gauze placed up on cervix. Tolerated with difficulty    (including critical care time)  Medications Ordered in ED Medications - No data to display   Initial Impression / Assessment and Plan / ED Course  I have reviewed the triage vital signs and the nursing notes.  Pertinent labs & imaging results that were available during my care of the patient were reviewed by me and considered in my medical decision making (see chart for details).     Hemoglobin is slightly lower but not significantly lower at 11.9.  Typically she runs around 12.  Her INR is a little bit low for her at 2.35 given she has a mitral valve replacement.  She continues to have some bleeding but her vital signs do not show signs of shock.  She has no signs of symptomatic anemia.  I discussed with her OB/GYN, Dr. Elonda Husky who asks me to place, gauze for vaginal packing.  She tolerated this with difficulty but I was able to place some gauze up on her cervix.  He will see her tomorrow.  Discussed plan with patient who agrees and discussed return precautions.  Final Clinical Impressions(s) / ED Diagnoses   Final diagnoses:  Bleeding of cervix    ED Discharge Orders    None       Sherwood Gambler, MD 03/17/17 [id number]

## 2017-03-17 LAB — BASIC METABOLIC PANEL
Anion gap: 12 (ref 5–15)
BUN: 12 mg/dL (ref 6–20)
CO2: 24 mmol/L (ref 22–32)
Calcium: 8.9 mg/dL (ref 8.9–10.3)
Chloride: 101 mmol/L (ref 101–111)
Creatinine, Ser: 0.77 mg/dL (ref 0.44–1.00)
GFR calc Af Amer: >60 mL/min (ref >60)
GFR calc non Af Amer: >60 mL/min (ref >60)
Glucose, Bld: 107 mg/dL — ABNORMAL HIGH (ref 65–99)
Potassium: 3.4 mmol/L — ABNORMAL LOW (ref 3.5–5.1)
Sodium: 137 mmol/L (ref 135–145)

## 2017-03-17 LAB — PROTIME-INR
INR: 2.35
Prothrombin Time: 25.6 seconds — ABNORMAL HIGH (ref 11.4–15.2)

## 2017-03-17 NOTE — ED Notes (Signed)
EDP successfully inserted 1 2x2 in pt's vagina to help stop the vaginal bleeding.

## 2017-03-18 ENCOUNTER — Encounter: Payer: Self-pay | Admitting: Obstetrics & Gynecology

## 2017-03-18 ENCOUNTER — Ambulatory Visit (INDEPENDENT_AMBULATORY_CARE_PROVIDER_SITE_OTHER): Payer: Medicare Other | Admitting: Obstetrics & Gynecology

## 2017-03-18 VITALS — BP 110/82 | HR 82 | Ht 64.0 in | Wt 160.0 lb

## 2017-03-18 DIAGNOSIS — N888 Other specified noninflammatory disorders of cervix uteri: Secondary | ICD-10-CM | POA: Diagnosis not present

## 2017-03-18 DIAGNOSIS — Z9889 Other specified postprocedural states: Secondary | ICD-10-CM

## 2017-03-18 NOTE — Progress Notes (Signed)
  HPI: Patient returns for routine postoperative follow-up having undergone laser conization of the cervix on March 06, 2017.  The patient's immediate postoperative recovery has been unremarkable. Since hospital discharge the patient reports new onset of heavy vaginal bleeding The patient is chronically anticoagulated because of a [employer] Jude heart valve She had not really had any bleeding but it started last couple of days.   Current Outpatient Medications: ALPRAZolam (XANAX) 0.5 MG tablet, Take 0.5 mg by mouth daily as needed for anxiety or sleep. , Disp: , Rfl:  cyanocobalamin (,VITAMIN B-12,) 1000 MCG/ML injection, Inject 1 mL (1,000 mcg total) into the muscle every 30 (thirty) days., Disp: 10 mL, Rfl: prn DEXILANT 60 MG capsule, TAKE ONE CAPSULE BY MOUTH DAILY. (Patient taking differently: TAKE 60 MG BY MOUTH DAILY.), Disp: 90 capsule, Rfl: 3 furosemide (LASIX) 40 MG tablet, Take 1 tablet by mouth daily., Disp: , Rfl:  gabapentin (NEURONTIN) 600 MG tablet, Take 600 mg by mouth 2 (two) times daily. , Disp: , Rfl:  ibuprofen (ADVIL,MOTRIN) 800 MG tablet, Take 800 mg by mouth 2 (two) times daily as needed for mild pain or moderate pain. Reported on 08/30/2015, Disp: , Rfl:  ketorolac (TORADOL) 10 MG tablet, Take 1 tablet (10 mg total) by mouth every 8 (eight) hours as needed., Disp: 15 tablet, Rfl: 0 levothyroxine (SYNTHROID, LEVOTHROID) 100 MCG tablet, Take 1 tablet (100 mcg total) by mouth daily., Disp: 90 tablet, Rfl: 4 metoCLOPramide (REGLAN) 5 MG tablet, Take 1 tablet (5 mg total) by mouth 2 (two) times daily., Disp: 180 tablet, Rfl: 3 ondansetron (ZOFRAN ODT) 8 MG disintegrating tablet, Take 1 tablet (8 mg total) by mouth every 8 (eight) hours as needed for nausea or vomiting., Disp: 20 tablet, Rfl: 0 ondansetron (ZOFRAN) 4 MG tablet, Take 1 tablet (4 mg total) by mouth every 8 (eight) hours as needed for nausea or vomiting., Disp: 60 tablet, Rfl: 3 oxyCODONE-acetaminophen  (PERCOCET/ROXICET) 5-325 MG tablet, Take 1-2 tablets by mouth every 4 (four) hours as needed for severe pain., Disp: 30 tablet, Rfl: 0 potassium chloride (KLOR-CON 10) 10 MEQ tablet, Take 10 mEq by mouth every evening. Take one tablet daily as directed, Disp: , Rfl:  promethazine (PHENERGAN) 12.5 MG tablet, Take 1 tablet (12.5 mg total) by mouth every 4 (four) hours as needed for nausea or vomiting., Disp: 20 tablet, Rfl: 0 ranitidine (ZANTAC) 150 MG tablet, Take 1 tablet (150 mg total) by mouth at bedtime., Disp: 90 tablet, Rfl: 3 riTUXimab (RITUXAN) 100 MG/10ML injection, Inject into the vein. Every 4 months, Disp: , Rfl:  rOPINIRole (REQUIP) 1 MG tablet, Take 1 mg by mouth at bedtime., Disp: , Rfl:  warfarin (COUMADIN) 5 MG tablet, TAKE 1 & 1/2 TABLETS BY MOUTH DAILY EXCEPT TAKE 2 TABLETS ON TUESDAYS, THURSDAYS AND SATURDAYS, Disp: 60 tablet, Rfl: 6 zolpidem (AMBIEN) 10 MG tablet, Take 10 mg by mouth at bedtime. , Disp: , Rfl:   No current facility-administered medications for this visit.     Blood pressure 110/82, pulse 82, height 5\' 4"  (1.626 m), weight 160 lb (72.6 kg).  Physical Exam: On exam she does have active bleeding from the cervix and is managed with Monsel's and pressure  Diagnostic Tests:   Pathology: High-grade dysplasia of the cervix  Impression: Status post laser conization of the cervix with delayed postoperative bleeding due to chronic anticoagulation  Plan: Monsel's was placed today  Follow up: 1  weeks  Florian Buff, MD

## 2017-03-19 ENCOUNTER — Other Ambulatory Visit: Payer: Self-pay | Admitting: Cardiology

## 2017-03-19 LAB — CBC
Hematocrit: 35.2 % (ref 34.0–46.6)
Hemoglobin: 11.6 g/dL (ref 11.1–15.9)
MCH: 29.5 pg (ref 26.6–33.0)
MCHC: 33 g/dL (ref 31.5–35.7)
MCV: 90 fL (ref 79–97)
PLATELETS: 252 10*3/uL (ref 150–379)
RBC: 3.93 x10E6/uL (ref 3.77–5.28)
RDW: 14.3 % (ref 12.3–15.4)
WBC: 8.7 10*3/uL (ref 3.4–10.8)

## 2017-03-25 DIAGNOSIS — Z6828 Body mass index (BMI) 28.0-28.9, adult: Secondary | ICD-10-CM | POA: Diagnosis not present

## 2017-03-25 DIAGNOSIS — E663 Overweight: Secondary | ICD-10-CM | POA: Diagnosis not present

## 2017-03-25 DIAGNOSIS — G894 Chronic pain syndrome: Secondary | ICD-10-CM | POA: Diagnosis not present

## 2017-03-25 DIAGNOSIS — D511 Vitamin B12 deficiency anemia due to selective vitamin B12 malabsorption with proteinuria: Secondary | ICD-10-CM | POA: Diagnosis not present

## 2017-03-26 ENCOUNTER — Encounter: Payer: Self-pay | Admitting: Obstetrics & Gynecology

## 2017-03-26 ENCOUNTER — Ambulatory Visit (INDEPENDENT_AMBULATORY_CARE_PROVIDER_SITE_OTHER): Payer: BLUE CROSS/BLUE SHIELD | Admitting: Obstetrics & Gynecology

## 2017-03-26 VITALS — BP 126/50 | HR 80 | Ht 64.0 in | Wt 164.0 lb

## 2017-03-26 DIAGNOSIS — Z9889 Other specified postprocedural states: Secondary | ICD-10-CM

## 2017-03-26 MED ORDER — NYSTATIN-TRIAMCINOLONE 100000-0.1 UNIT/GM-% EX OINT: 1.0000 "application " | TOPICAL_OINTMENT | Freq: Two times a day (BID) | CUTANEOUS | 11 refills | Status: DC

## 2017-03-26 NOTE — Progress Notes (Signed)
HPI: Patient returns for routine postoperative follow-up having undergone status post laser conization of the cervix on March 06, 2017.  The patient's immediate postoperative recovery has been unremarkable. Since hospital discharge the patient reports normal discharge no unusual bleeding.   Current Outpatient Medications: ALPRAZolam (XANAX) 0.5 MG tablet, Take 0.5 mg by mouth daily as needed for anxiety or sleep. , Disp: , Rfl:  cyanocobalamin (,VITAMIN B-12,) 1000 MCG/ML injection, Inject 1 mL (1,000 mcg total) into the muscle every 30 (thirty) days., Disp: 10 mL, Rfl: prn DEXILANT 60 MG capsule, TAKE ONE CAPSULE BY MOUTH DAILY. (Patient taking differently: TAKE 60 MG BY MOUTH DAILY.), Disp: 90 capsule, Rfl: 3 furosemide (LASIX) 40 MG tablet, TAKE ONE TABLET BY MOUTH ONCE DAILY., Disp: 90 tablet, Rfl: 1 gabapentin (NEURONTIN) 600 MG tablet, Take 600 mg by mouth 2 (two) times daily. , Disp: , Rfl:  ibuprofen (ADVIL,MOTRIN) 800 MG tablet, Take 800 mg by mouth 2 (two) times daily as needed for mild pain or moderate pain. Reported on 08/30/2015, Disp: , Rfl:  levothyroxine (SYNTHROID, LEVOTHROID) 100 MCG tablet, Take 1 tablet (100 mcg total) by mouth daily., Disp: 90 tablet, Rfl: 4 metoCLOPramide (REGLAN) 5 MG tablet, Take 1 tablet (5 mg total) by mouth 2 (two) times daily., Disp: 180 tablet, Rfl: 3 oxyCODONE-acetaminophen (PERCOCET/ROXICET) 5-325 MG tablet, Take 1-2 tablets by mouth every 4 (four) hours as needed for severe pain., Disp: 30 tablet, Rfl: 0 potassium chloride (KLOR-CON 10) 10 MEQ tablet, Take 10 mEq by mouth every evening. Take one tablet daily as directed, Disp: , Rfl:  ranitidine (ZANTAC) 150 MG tablet, Take 1 tablet (150 mg total) by mouth at bedtime., Disp: 90 tablet, Rfl: 3 riTUXimab (RITUXAN) 100 MG/10ML injection, Inject into the vein. Every 4 months, Disp: , Rfl:  rOPINIRole (REQUIP) 1 MG tablet, Take 1 mg by mouth at bedtime., Disp: , Rfl:  warfarin (COUMADIN) 5 MG tablet,  TAKE 1 & 1/2 TABLETS BY MOUTH DAILY EXCEPT TAKE 2 TABLETS ON TUESDAYS, THURSDAYS AND SATURDAYS, Disp: 60 tablet, Rfl: 6 zolpidem (AMBIEN) 10 MG tablet, Take 10 mg by mouth at bedtime. , Disp: , Rfl:  ketorolac (TORADOL) 10 MG tablet, Take 1 tablet (10 mg total) by mouth every 8 (eight) hours as needed. (Patient not taking: Reported on 03/26/2017), Disp: 15 tablet, Rfl: 0 nystatin-triamcinolone ointment (MYCOLOG), Apply 1 application topically 2 (two) times daily., Disp: 30 g, Rfl: 11 ondansetron (ZOFRAN ODT) 8 MG disintegrating tablet, Take 1 tablet (8 mg total) by mouth every 8 (eight) hours as needed for nausea or vomiting. (Patient not taking: Reported on 03/26/2017), Disp: 20 tablet, Rfl: 0 ondansetron (ZOFRAN) 4 MG tablet, Take 1 tablet (4 mg total) by mouth every 8 (eight) hours as needed for nausea or vomiting. (Patient not taking: Reported on 03/26/2017), Disp: 60 tablet, Rfl: 3 promethazine (PHENERGAN) 12.5 MG tablet, Take 1 tablet (12.5 mg total) by mouth every 4 (four) hours as needed for nausea or vomiting. (Patient not taking: Reported on 03/26/2017), Disp: 20 tablet, Rfl: 0  No current facility-administered medications for this visit.     Blood pressure (!) 126/50, pulse 80, height 5\' 4"  (1.626 m), weight 164 lb (74.4 kg).  Physical Exam: Cervical laser bed is healing well with no undue bleeding This of course is critically important with patient since she is chronically anticoagulated  Diagnostic Tests:   Pathology: High-grade dysplasia with negative margins  Impression: Status post laser conization of the cervix for high-grade dysplasia with normal postoperative course  Plan:  Monsel's was placed on the cervix today  Follow up: 6  months for first follow-up Pap smear  Florian Buff, MD

## 2017-04-03 ENCOUNTER — Ambulatory Visit (INDEPENDENT_AMBULATORY_CARE_PROVIDER_SITE_OTHER): Payer: BLUE CROSS/BLUE SHIELD | Admitting: *Deleted

## 2017-04-03 DIAGNOSIS — Z952 Presence of prosthetic heart valve: Secondary | ICD-10-CM

## 2017-04-03 DIAGNOSIS — Z5181 Encounter for therapeutic drug level monitoring: Secondary | ICD-10-CM | POA: Diagnosis not present

## 2017-04-03 DIAGNOSIS — Z1212 Encounter for screening for malignant neoplasm of rectum: Secondary | ICD-10-CM

## 2017-04-03 DIAGNOSIS — Z1211 Encounter for screening for malignant neoplasm of colon: Secondary | ICD-10-CM

## 2017-04-03 LAB — POCT INR: INR: 4

## 2017-04-03 NOTE — Patient Instructions (Signed)
Hold coumadin tonight then resume 1 1/2 tablets daily except 2 tablets on Tuesdays, Thursdays and Saturdays Recheck in 2 weeks

## 2017-04-10 ENCOUNTER — Encounter: Payer: Self-pay | Admitting: Gastroenterology

## 2017-04-10 ENCOUNTER — Ambulatory Visit (INDEPENDENT_AMBULATORY_CARE_PROVIDER_SITE_OTHER): Payer: BLUE CROSS/BLUE SHIELD | Admitting: Gastroenterology

## 2017-04-10 VITALS — BP 107/75 | HR 73 | Temp 97.1°F | Ht 64.0 in | Wt 164.2 lb

## 2017-04-10 DIAGNOSIS — K3184 Gastroparesis: Secondary | ICD-10-CM | POA: Diagnosis not present

## 2017-04-10 NOTE — Assessment & Plan Note (Signed)
Doing well with low-dose Reglan BID, Zofran prn, Dexilant daily and Zantac. Abdominal discomfort with bloating much improved with dietary measures. No alarm features, weight is stable. Discussed pursuing EGD if any recurrence of upper abdominal pain. Return in 6 months.

## 2017-04-10 NOTE — Progress Notes (Signed)
CC'ED TO PCP 

## 2017-04-10 NOTE — Progress Notes (Signed)
Primary Care Physician:  Sharilyn Sites, MD  Primary GI: Dr. Gala Romney   Chief Complaint  Patient presents with  . Abdominal Pain    lower abd    HPI:   [name] is a 47 y.o. female presenting today with a history of erosive esophagitis and gastroparesis. Has done well on low dose Reglan BID historically, PPI. Zofran now covered by insurance and keeps on hand if needed. Unable to take erythromycin as she is on chronic Coumadin.Was on methotrexate for about a year then taken off due to elevated LFTs.From review of labs, it appears this may have been in 2015. 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 March 2017: Negative Hep B surface antigen, Negative Hep C antibody, negative Hep A IgM, negative Hep B core antibody. Liver biopsy with chronic hepatitis, mild steatosis, no significant fibrosis, differentials including drugs and partially treated autoimmune hepatitis. Discussed with patient. She has completely normal LFTs. Will need to follow transaminases and immunoglobulins. Consistent with AASLD, treatment is recommended with IgG greater than twice upper limit of normal if transaminases are at least fivefold upper limit of normal, with no treatment recommended if IgG less than twice upper limit of normal unless transaminases are greater than 10 fold. Will need to be monitored serially. Discussed second opinion, and she declines this. Bump in LFTs in past were likely secondary to methotrexate. She is on multiple medications now. LFTs remaining normal on multiple draws.   Noted upper abdominal discomfort with bloating at last visit. Intermittent nausea and rare vomiting noted. Tried a gluten-free diet but not 100% strict with this, but she did notice improvement in symptoms. Prior EGD several years ago with normal appearing duodenum but no biopsies. Celiac serologies have been negative historically. Continues with Dexilant. Weight is overall stable.   States her discomfort is  better. Largely related to what she eats. Not so much pain as "discomfort" with bloating. Last really bad flare in Nov 2018 after eating cheesesteak. Wants to hold off on EGD. No alarm features. Reglan BID. Zofran helps as needed for nausea.   Past Medical History:  Diagnosis Date  . Abnormal Papanicolaou smear of cervix with positive human papilloma virus (HPV) test 08/17/2015  . Anticoagulation goal of INR 2.5 to 3.5   . Anxiety   . B12 deficiency   . BRCA1 negative   . BRCA2 negative   . Breast cancer, left breast ([place]) 2011   S/P mastectomy; chemo; "no radiation due to lupus"  . Bursitis of right knee    Septic bursitis  . CHF (congestive heart failure) ([place])   . Chronic lower back pain   . Drug-induced hepatitis    States per her rheumatologist, transaminases elevated but normalized after drug removed for   . Fibromyalgia   . GERD (gastroesophageal reflux disease)   . 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 History of abnormal cervical Pap smear 08/12/2015  . History of blood transfusion    "when I had my heart OR; maybe again when I was dealing w/breast cancer" (11/28/2016)  . HSIL (high grade squamous intraepithelial lesion) on Pap smear of cervix 01/14/2017   colpo with Dr Elonda Husky   . Hypothyroidism   . Lupus   . Mitral valve disease, rheumatic    St. Jude prosthesis  . Peripheral neuropathy   . Pneumonia   . PONV (postoperative nausea and vomiting)   . Raynaud disease   . Raynaud phenomenon   .  SLE (systemic lupus erythematosus) ([place])     Past Surgical History:  Procedure Laterality Date  . APPENDECTOMY N/A 05/23/2016   Procedure: OPEN APPENDECTOMY WITH DRAINAGE OF ABSCESS;  Surgeon: Fanny Skates, MD;  Location: [facility];  Service: General;  Laterality: N/A;  . APPENDECTOMY    . BREAST BIOPSY Left 2012  . BREAST IMPLANT EXCHANGE Left 12/2015  . CARDIAC CATHETERIZATION  2008  . CARDIAC VALVE REPLACEMENT    . CERVICAL CONIZATION W/BX N/A  03/06/2017   Procedure: Laser Conization of Cervix;  Surgeon: Florian Buff, MD;  Location: [facility];  Service: Gynecology;  Laterality: N/A;  . ENDOMETRIAL ABLATION  2008   She no longer has menses  . ESOPHAGOGASTRODUODENOSCOPY N/A 03/25/2013   Dr. Raliegh Scarlet reflux esophagitis-likely source of patient's symptoms (patulous EG junction). Hiatal hernia otherwise normal  . LAPAROSCOPIC CHOLECYSTECTOMY    . LIVER BIOPSY  11/28/2016   liver core/notes 11/28/2016  . MASTECTOMY Left 2012  . MASTOPEXY  02/14/2011   Procedure: MASTOPEXY;  Surgeon: Macon Large;  Location: [facility];  Service: Plastics;  Laterality: Bilateral;  Right Breast Reduction   . MITRAL VALVE REPLACEMENT  2008  . TISSUE EXPANDER PLACEMENT  02/14/2011   Procedure: TISSUE EXPANDER;  Surgeon: Macon Large;  Location: [facility];  Service: Plastics;  Laterality: Bilateral;  Left Breast Remove Tissue Expander Placement of Implant Breast Reconstruction  . TISSUE EXPANDER PLACEMENT Left 11/16/2010   breasst  . TUBAL LIGATION      Current Outpatient Medications  Medication Sig Dispense Refill  . ALPRAZolam (XANAX) 0.5 MG tablet Take 0.5 mg by mouth daily as needed for anxiety or sleep.     . cyanocobalamin (,VITAMIN B-12,) 1000 MCG/ML injection Inject 1 mL (1,000 mcg total) into the muscle every 30 (thirty) days. 10 mL prn  . DEXILANT 60 MG capsule TAKE ONE CAPSULE BY MOUTH DAILY. (Patient taking differently: TAKE 60 MG BY MOUTH DAILY.) 90 capsule 3  . furosemide (LASIX) 40 MG tablet TAKE ONE TABLET BY MOUTH ONCE DAILY. 90 tablet 1  . gabapentin (NEURONTIN) 600 MG tablet Take 600 mg by mouth 2 (two) times daily.     Marland Kitchen ibuprofen (ADVIL,MOTRIN) 800 MG tablet Take 800 mg by mouth 2 (two) times daily as needed for mild pain or moderate pain. Reported on 08/30/2015    . levothyroxine (SYNTHROID, LEVOTHROID) 100 MCG tablet Take 1 tablet (100 mcg total) by mouth daily. 90 tablet 4  . metoCLOPramide (REGLAN) 5 MG tablet Take 1 tablet (5 mg  total) by mouth 2 (two) times daily. 180 tablet 3  . ondansetron (ZOFRAN) 4 MG tablet Take 1 tablet (4 mg total) by mouth every 8 (eight) hours as needed for nausea or vomiting. 60 tablet 3  . oxyCODONE-acetaminophen (PERCOCET/ROXICET) 5-325 MG tablet Take 1-2 tablets by mouth every 4 (four) hours as needed for severe pain. 30 tablet 0  . potassium chloride (KLOR-CON 10) 10 MEQ tablet Take 10 mEq by mouth every evening. Take one tablet daily as directed    . promethazine (PHENERGAN) 12.5 MG tablet Take 1 tablet (12.5 mg total) by mouth every 4 (four) hours as needed for nausea or vomiting. 20 tablet 0  . ranitidine (ZANTAC) 150 MG tablet Take 1 tablet (150 mg total) by mouth at bedtime. 90 tablet 3  . riTUXimab (RITUXAN) 100 MG/10ML injection Inject into the vein. Every 4 months    . rOPINIRole (REQUIP) 1 MG tablet Take 1 mg by mouth at bedtime.    Marland Kitchen  warfarin (COUMADIN) 5 MG tablet TAKE 1 & 1/2 TABLETS BY MOUTH DAILY EXCEPT TAKE 2 TABLETS ON TUESDAYS, THURSDAYS AND SATURDAYS 60 tablet 6  . zolpidem (AMBIEN) 10 MG tablet Take 10 mg by mouth at bedtime.     Marland Kitchen nystatin-triamcinolone ointment (MYCOLOG) Apply 1 application topically 2 (two) times daily. (Patient not taking: Reported on 04/10/2017) 30 g 11   No current facility-administered medications for this visit.     Allergies as of 04/10/2017 - Review Complete 04/10/2017  Allergen Reaction Noted  . Methotrexate derivatives Other (See Comments) 11/29/2016  . Other  05/25/2016    Family History  Problem Relation Age of Onset  . Hypertension Mother   . Hyperlipidemia Mother   . Hypertension Father   . Colon cancer Neg Hx     Social History   Socioeconomic History  . Marital status: Married    Spouse name: None  . Number of children: 2  . Years of education: None  . Highest education level: None  Social Needs  . Financial resource strain: None  . Food insecurity - worry: None  . Food insecurity - inability: None  . Transportation  needs - medical: None  . Transportation needs - non-medical: None  Occupational History  . None  Tobacco Use  . Smoking status: Former Smoker    Packs/day: 1.00    Years: 15.00    Pack years: 15.00    Types: Cigarettes    Last attempt to quit: 02/19/2006    Years since quitting: 11.1  . Smokeless tobacco: Never Used  Substance and Sexual Activity  . Alcohol use: Yes    Alcohol/week: 0.0 oz    Comment: 11/28/2016 "couple drinks/yearZ"  . Drug use: No  . Sexual activity: Not Currently    Birth control/protection: Surgical    Comment: tubal and ablation  Other Topics Concern  . None  Social History Narrative  . None    Review of Systems: Gen: Denies fever, chills, anorexia. Denies fatigue, weakness, weight loss.  CV: Denies chest pain, palpitations, syncope, peripheral edema, and claudication. Resp: Denies dyspnea at rest, cough, wheezing, coughing up blood, and pleurisy. GI: see HPI  Derm: Denies rash, itching, dry skin Psych: Denies depression, anxiety, memory loss, confusion. No homicidal or suicidal ideation.  Heme: Denies bruising, bleeding, and enlarged lymph nodes.  Physical Exam: BP 107/75   Pulse 73   Temp (!) 97.1 F (36.2 C) (Oral)   Ht 5' 4"  (1.626 m)   Wt 164 lb 3.2 oz (74.5 kg)   BMI 28.18 kg/m  General:   Alert and oriented. No distress noted. Pleasant and cooperative.  Head:  Normocephalic and atraumatic. Eyes:  Conjuctiva clear without scleral icterus. Mouth:  Oral mucosa pink and moist. Good dentition. No lesions. Abdomen:  +BS, soft, non-tender and non-distended. No rebound or guarding. No HSM or masses noted. Msk:  Symmetrical without gross deformities. Normal posture. Extremities:  Without edema. Neurologic:  Alert and  oriented x4 Psych:  Alert and cooperative. Normal mood and affect.  Lab Results  Component Value Date   ALT 15 03/01/2017   AST 18 03/01/2017   ALKPHOS 79 03/01/2017   BILITOT 0.6 03/01/2017   Lab Results  Component Value  Date   WBC 8.7 03/18/2017   HGB 11.6 03/18/2017   HCT 35.2 03/18/2017   MCV 90 03/18/2017   PLT 252 03/18/2017

## 2017-04-10 NOTE — Patient Instructions (Signed)
I am glad you are doing well!  Please call if any issues in the meantime.  We will see you in 6 months!  It was a pleasure to see you today. I strive to create trusting relationships with patients to provide genuine, compassionate, and quality care. I value your feedback. If you receive a survey regarding your visit,  I greatly appreciate you the taking time to fill this out.   Annitta Needs, PhD, ANP-BC [facility]

## 2017-04-17 ENCOUNTER — Ambulatory Visit (INDEPENDENT_AMBULATORY_CARE_PROVIDER_SITE_OTHER): Payer: BLUE CROSS/BLUE SHIELD | Admitting: *Deleted

## 2017-04-17 DIAGNOSIS — Z952 Presence of prosthetic heart valve: Secondary | ICD-10-CM | POA: Diagnosis not present

## 2017-04-17 DIAGNOSIS — Z5181 Encounter for therapeutic drug level monitoring: Secondary | ICD-10-CM

## 2017-04-17 LAB — POCT INR: INR: 4

## 2017-04-17 NOTE — Patient Instructions (Signed)
Hold coumadin tonight then decrease dose to 1 1/2 tablets daily except 2 tablets on Tuesdays, and Fridays Recheck in 2 weeks

## 2017-04-22 DIAGNOSIS — Z6828 Body mass index (BMI) 28.0-28.9, adult: Secondary | ICD-10-CM | POA: Diagnosis not present

## 2017-04-22 DIAGNOSIS — G894 Chronic pain syndrome: Secondary | ICD-10-CM | POA: Diagnosis not present

## 2017-04-22 DIAGNOSIS — E538 Deficiency of other specified B group vitamins: Secondary | ICD-10-CM | POA: Diagnosis not present

## 2017-04-22 DIAGNOSIS — D510 Vitamin B12 deficiency anemia due to intrinsic factor deficiency: Secondary | ICD-10-CM | POA: Diagnosis not present

## 2017-04-22 DIAGNOSIS — Z1389 Encounter for screening for other disorder: Secondary | ICD-10-CM | POA: Diagnosis not present

## 2017-04-22 DIAGNOSIS — E663 Overweight: Secondary | ICD-10-CM | POA: Diagnosis not present

## 2017-04-24 ENCOUNTER — Telehealth: Payer: Self-pay | Admitting: Obstetrics & Gynecology

## 2017-04-25 ENCOUNTER — Telehealth: Payer: Self-pay | Admitting: Obstetrics & Gynecology

## 2017-04-25 MED ORDER — METRONIDAZOLE 0.75 % VA GEL: VAGINAL | 0 refills | Status: DC

## 2017-04-25 NOTE — Telephone Encounter (Signed)
Pt called stating that she has started having a yellow colored discharge. She states that all her post op bleeding and discharge had stopped. This just started. She states there is little to no odor and no itching. She is asking if a medication can be sent in for this. Informed her that I would send her request to Dr Elonda Husky and let her know. Advised her that she may have to come in for evaluation of the discharge before he would prescribe something. Pt verbalized understanding.

## 2017-04-25 NOTE — Telephone Encounter (Signed)
LMOM that Rx for metrogel had been sent to her pharmacy.

## 2017-04-29 DIAGNOSIS — M0609 Rheumatoid arthritis without rheumatoid factor, multiple sites: Secondary | ICD-10-CM | POA: Diagnosis not present

## 2017-04-29 DIAGNOSIS — M329 Systemic lupus erythematosus, unspecified: Secondary | ICD-10-CM | POA: Diagnosis not present

## 2017-04-29 DIAGNOSIS — M2550 Pain in unspecified joint: Secondary | ICD-10-CM | POA: Diagnosis not present

## 2017-04-29 DIAGNOSIS — Z79899 Other long term (current) drug therapy: Secondary | ICD-10-CM | POA: Diagnosis not present

## 2017-05-01 ENCOUNTER — Encounter (HOSPITAL_COMMUNITY): Payer: BLUE CROSS/BLUE SHIELD

## 2017-05-06 ENCOUNTER — Ambulatory Visit (INDEPENDENT_AMBULATORY_CARE_PROVIDER_SITE_OTHER): Payer: BLUE CROSS/BLUE SHIELD | Admitting: *Deleted

## 2017-05-06 ENCOUNTER — Inpatient Hospital Stay (HOSPITAL_COMMUNITY): Payer: BLUE CROSS/BLUE SHIELD | Admitting: Internal Medicine

## 2017-05-06 DIAGNOSIS — Z5181 Encounter for therapeutic drug level monitoring: Secondary | ICD-10-CM

## 2017-05-06 DIAGNOSIS — Z952 Presence of prosthetic heart valve: Secondary | ICD-10-CM

## 2017-05-06 LAB — POCT INR: INR: 4.8

## 2017-05-06 NOTE — Patient Instructions (Signed)
Hold coumadin tonight, take 1 tablet tomorrow then resume 1 1/2 tablets daily except 2 tablets on Tuesdays, and Fridays Recheck in 2 weeks

## 2017-05-07 ENCOUNTER — Inpatient Hospital Stay (HOSPITAL_COMMUNITY): Admission: RE | Admit: 2017-05-07 | Payer: BLUE CROSS/BLUE SHIELD | Source: Ambulatory Visit

## 2017-05-15 ENCOUNTER — Encounter (HOSPITAL_COMMUNITY)
Admission: RE | Admit: 2017-05-15 | Discharge: 2017-05-15 | Disposition: A | Discharge status: Home / Self Care | Payer: BLUE CROSS/BLUE SHIELD | Source: Ambulatory Visit | Attending: Rheumatology | Admitting: Rheumatology

## 2017-05-15 DIAGNOSIS — M069 Rheumatoid arthritis, unspecified: Secondary | ICD-10-CM | POA: Diagnosis not present

## 2017-05-15 MED ORDER — METHYLPREDNISOLONE SODIUM SUCC 125 MG IJ SOLR
125.0000 mg | Freq: Once | INTRAMUSCULAR | Status: AC
  Administered 2017-05-15: 125 mg via INTRAVENOUS

## 2017-05-15 MED ORDER — ACETAMINOPHEN 325 MG PO TABS
ORAL_TABLET | ORAL | Status: AC
  Filled 2017-05-15: qty 2

## 2017-05-15 MED ORDER — DIPHENHYDRAMINE HCL 50 MG/ML IJ SOLN
25.0000 mg | Freq: Once | INTRAMUSCULAR | Status: AC
  Administered 2017-05-15: 25 mg via INTRAVENOUS

## 2017-05-15 MED ORDER — METHYLPREDNISOLONE SODIUM SUCC 125 MG IJ SOLR
INTRAMUSCULAR | Status: AC
  Filled 2017-05-15: qty 2

## 2017-05-15 MED ORDER — DIPHENHYDRAMINE HCL 50 MG/ML IJ SOLN
INTRAMUSCULAR | Status: AC
  Filled 2017-05-15: qty 1

## 2017-05-15 MED ORDER — ACETAMINOPHEN 325 MG PO TABS
650.0000 mg | ORAL_TABLET | Freq: Once | ORAL | Status: AC
  Administered 2017-05-15: 650 mg via ORAL

## 2017-05-15 MED ORDER — SODIUM CHLORIDE 0.9 % IV SOLN
1000.0000 mg | Freq: Once | INTRAVENOUS | Status: AC
  Administered 2017-05-15: 1000 mg via INTRAVENOUS
  Filled 2017-05-15: qty 100

## 2017-05-15 MED ORDER — SODIUM CHLORIDE 0.9 % IV SOLN
Freq: Once | INTRAVENOUS | Status: AC
  Administered 2017-05-15: 250 mL via INTRAVENOUS

## 2017-05-17 ENCOUNTER — Inpatient Hospital Stay (HOSPITAL_COMMUNITY): Payer: BLUE CROSS/BLUE SHIELD | Attending: Internal Medicine | Admitting: Internal Medicine

## 2017-05-17 ENCOUNTER — Encounter (HOSPITAL_COMMUNITY): Payer: Self-pay | Admitting: Internal Medicine

## 2017-05-17 ENCOUNTER — Other Ambulatory Visit: Payer: Self-pay

## 2017-05-17 VITALS — BP 115/71 | HR 64 | Temp 98.2°F | Resp 16 | Wt 166.7 lb

## 2017-05-17 DIAGNOSIS — Z952 Presence of prosthetic heart valve: Secondary | ICD-10-CM | POA: Diagnosis not present

## 2017-05-17 DIAGNOSIS — Z853 Personal history of malignant neoplasm of breast: Secondary | ICD-10-CM | POA: Diagnosis not present

## 2017-05-17 DIAGNOSIS — E039 Hypothyroidism, unspecified: Secondary | ICD-10-CM | POA: Diagnosis not present

## 2017-05-17 DIAGNOSIS — Z79899 Other long term (current) drug therapy: Secondary | ICD-10-CM

## 2017-05-17 DIAGNOSIS — E538 Deficiency of other specified B group vitamins: Secondary | ICD-10-CM | POA: Diagnosis not present

## 2017-05-17 DIAGNOSIS — D649 Anemia, unspecified: Secondary | ICD-10-CM

## 2017-05-17 DIAGNOSIS — G629 Polyneuropathy, unspecified: Secondary | ICD-10-CM

## 2017-05-17 DIAGNOSIS — I7300 Raynaud's syndrome without gangrene: Secondary | ICD-10-CM

## 2017-05-17 DIAGNOSIS — M329 Systemic lupus erythematosus, unspecified: Secondary | ICD-10-CM

## 2017-05-17 DIAGNOSIS — Z9221 Personal history of antineoplastic chemotherapy: Secondary | ICD-10-CM | POA: Diagnosis not present

## 2017-05-17 DIAGNOSIS — Z9012 Acquired absence of left breast and nipple: Secondary | ICD-10-CM

## 2017-05-17 DIAGNOSIS — C50412 Malignant neoplasm of upper-outer quadrant of left female breast: Secondary | ICD-10-CM

## 2017-05-17 DIAGNOSIS — F419 Anxiety disorder, unspecified: Secondary | ICD-10-CM | POA: Diagnosis not present

## 2017-05-17 DIAGNOSIS — Z87891 Personal history of nicotine dependence: Secondary | ICD-10-CM | POA: Diagnosis not present

## 2017-05-17 DIAGNOSIS — I509 Heart failure, unspecified: Secondary | ICD-10-CM | POA: Diagnosis not present

## 2017-05-17 DIAGNOSIS — M797 Fibromyalgia: Secondary | ICD-10-CM | POA: Diagnosis not present

## 2017-05-17 DIAGNOSIS — K219 Gastro-esophageal reflux disease without esophagitis: Secondary | ICD-10-CM | POA: Diagnosis not present

## 2017-05-17 DIAGNOSIS — Z171 Estrogen receptor negative status [ER-]: Secondary | ICD-10-CM | POA: Insufficient documentation

## 2017-05-17 NOTE — Progress Notes (Signed)
Diagnosis Malignant neoplasm of upper-outer quadrant of left breast in female, estrogen receptor negative ([place]) - Plan: MM Digital Screening Unilat R  Staging Cancer Staging Malignant neoplasm of upper outer quadrant of female breast ([place]) Staging form: Breast, AJCC 7th Edition - Clinical: Stage IIA (T2, N0, cM0) - Signed by Baird Cancer, PA on 08/16/2010 - Pathologic: No stage assigned - Unsigned   Assessment and Plan: 1.  Stage IIA left breast invasive ductal carcinoma, ER-/PR-/HER2-:  Diagnosed in 6839.  47 year old female s/p neoadjuvant chemotherapy with FEC x 2 cycles, followed by left mastectomy, then adjuvant TC chemotherapy x 4 cycles; completed chemotherapy on 08/28/10. She is s/p left breast reconstruction and right breast mammoplasty x 2; last procedure done in 12/2015.     She is doing well today denies any complaints.  Last right breast mammogram was done 06/08/2016 and was negative.  She will be set up for right breast screening mammogram in October 2019.  She will follow-up at that time to go over results.  Likely following that visit she may be considered for yearly follow-up.  2.  Hemolytic anemia: Last labs done in January 2019 hemoglobin was 11.6.  Anemia was felt to be due to lupus.  She is followed by rheumatology and reports she gets Rituxan every 4 months.  She has counts performed with their office on a regular basis.  3.  Lupus.  She is followed by rheumatology and reports she is treated with Rituxan every 4 months.  Continue to follow with their office as recommended.     Malignant neoplasm of upper outer quadrant of female breast ([place])   12/28/2009 Initial Diagnosis    Left needle biopsy- invasive ductal ca, grade 3, triple negative with Ki-67 92%      01/09/2010 Survivorship    BRCA1 and BRCA2 negative      01/31/2010 - 03/03/2010 Chemotherapy    FEC x 2 cycles, neoadjuvantly      04/20/2010 Surgery    Left simple mastectomy with superior flap excision  showing a 1.6 cm invasive ductal carcinoma, grade 3, with negative resection margins, no LVI, 0/3 lymph nodes.  Triple negative with Ki-67 of 92%.      06/05/2010 - 08/28/2010 Chemotherapy    TC x 4 cycles        Problem List Patient Active Problem List   Diagnosis Date Noted  . HSIL (high grade squamous intraepithelial lesion) on Pap smear of cervix [R87.613] 01/14/2017  . Encounter for gynecological examination with Papanicolaou smear of cervix [Z01.419] 01/08/2017  . At risk for hemorrhage associated with liver biopsy [Z91.89] 11/28/2016  . Elevated LFTs [R94.5] 10/04/2016  . Appendicitis with peritoneal abscess [K35.33] 05/23/2016  . Sepsis, unspecified organism ([place]) [A41.9] 05/22/2016  . AKI (acute kidney injury) ([place]) [N17.9] 05/22/2016  . Abdominal pain [R10.9] 05/21/2016  . Appendicitis with abscess [K35.33] 05/21/2016  . Volume depletion [E86.9] 05/21/2016  . History of breast cancer [Z85.3] 05/21/2016  . Vaginal discharge [N89.8] 11/14/2015  . Vaginal burning [N94.9] 11/14/2015  . BV (bacterial vaginosis) [N76.0, B96.89] 11/14/2015  . Abnormal Papanicolaou smear of cervix with positive human papilloma virus (HPV) test [R87.89] 08/17/2015  . History of abnormal cervical Pap smear [Z87.898] 08/12/2015  . Primary hypothyroidism [E03.9] 12/07/2014  . Primary hypercoagulable state ([place]) [D68.59] 12/14/2013  . Encounter for monitoring cardiotoxic drug therapy [Z51.81, Z79.899] 11/23/2013  . Gastroparesis [K31.84] 08/13/2013  . Screening for colorectal cancer [Z12.11, Z12.12] 03/18/2013  . Dyspepsia [R10.13] 03/12/2013  .  Dyspnea [R06.00] 11/14/2012  . Anticoagulation goal of INR 2.5 to 3.5 [Z51.81, Z79.01]   . Peripheral neuropathy [G62.9]   . B12 deficiency [E53.8] 08/21/2010  . 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 BRCA1 negative [Z13.71] 08/16/2010  . BRCA2 negative [Z13.71] 08/16/2010  . Long term current use  of anticoagulant therapy [Z79.01] 05/17/2010  . Malignant neoplasm of upper outer quadrant of female breast ([place]) [C50.419] 02/24/2010  . History of mitral valve replacement with mechanical valve [Z95.2] 11/10/2009  . Systemic lupus erythematosus ([place]) [M32.9] 11/10/2009    Past Medical History Past Medical History:  Diagnosis Date  . Abnormal Papanicolaou smear of cervix with positive human papilloma virus (HPV) test 08/17/2015  . Anticoagulation goal of INR 2.5 to 3.5   . Anxiety   . B12 deficiency   . BRCA1 negative   . BRCA2 negative   . Breast cancer, left breast ([place]) 2011   S/P mastectomy; chemo; "no radiation due to lupus"  . Bursitis of right knee    Septic bursitis  . CHF (congestive heart failure) ([place])   . Chronic lower back pain   . Drug-induced hepatitis    States per her rheumatologist, transaminases elevated but normalized after drug removed for   . Fibromyalgia   . GERD (gastroesophageal reflux disease)   . 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 History of abnormal cervical Pap smear 08/12/2015  . History of blood transfusion    "when I had my heart OR; maybe again when I was dealing w/breast cancer" (11/28/2016)  . HSIL (high grade squamous intraepithelial lesion) on Pap smear of cervix 01/14/2017   colpo with Dr Elonda Husky   . Hypothyroidism   . Lupus   . Mitral valve disease, rheumatic    St. Jude prosthesis  . Peripheral neuropathy   . Pneumonia   . PONV (postoperative nausea and vomiting)   . Raynaud disease   . Raynaud phenomenon   . SLE (systemic lupus erythematosus) ([place])     Past Surgical History Past Surgical History:  Procedure Laterality Date  . APPENDECTOMY N/A 05/23/2016   Procedure: OPEN APPENDECTOMY WITH DRAINAGE OF ABSCESS;  Surgeon: Fanny Skates, MD;  Location: [facility];  Service: General;  Laterality: N/A;  . APPENDECTOMY    . BREAST BIOPSY Left 2012  . BREAST IMPLANT EXCHANGE Left 12/2015  . CARDIAC  CATHETERIZATION  2008  . CARDIAC VALVE REPLACEMENT    . CERVICAL CONIZATION W/BX N/A 03/06/2017   Procedure: Laser Conization of Cervix;  Surgeon: Florian Buff, MD;  Location[facility];  Service: Gynecology;  Laterality: N/A;  . ENDOMETRIAL ABLATION  2008   She no longer has menses  . ESOPHAGOGASTRODUODENOSCOPY N/A 03/25/2013   Dr. Raliegh Scarlet reflux esophagitis-likely source of patient's symptoms (patulous EG junction). Hiatal hernia otherwise normal  . LAPAROSCOPIC CHOLECYSTECTOMY    . LIVER BIOPSY  11/28/2016   liver core/notes 11/28/2016  . MASTECTOMY Left 2012  . MASTOPEXY  02/14/2011   Procedure: MASTOPEXY;  Surgeon: Macon Large;  Location: [facility];  Service: Plastics;  Laterality: Bilateral;  Right Breast Reduction   . MITRAL VALVE REPLACEMENT  2008  . TISSUE EXPANDER PLACEMENT  02/14/2011   Procedure: TISSUE EXPANDER;  Surgeon: Macon Large;  Location: [place];  Service: Plastics;  Laterality: Bilateral;  Left Breast Remove Tissue Expander Placement of Implant Breast Reconstruction  . TISSUE EXPANDER PLACEMENT Left 11/16/2010   breasst  . TUBAL LIGATION  Family History Family History  Problem Relation Age of Onset  . Hypertension Mother   . Hyperlipidemia Mother   . Hypertension Father   . Colon cancer Neg Hx      Social History  reports that she quit smoking about 11 years ago. Her smoking use included cigarettes. She has a 15.00 pack-year smoking history. She has never used smokeless tobacco. She reports that she drinks alcohol. She reports that she does not use drugs.  Medications  Current Outpatient Medications:  .  ALPRAZolam (XANAX) 0.5 MG tablet, Take 0.5 mg by mouth daily as needed for anxiety or sleep. , Disp: , Rfl:  .  cyanocobalamin (,VITAMIN B-12,) 1000 MCG/ML injection, Inject 1 mL (1,000 mcg total) into the muscle every 30 (thirty) days., Disp: 10 mL, Rfl: prn .  DEXILANT 60 MG capsule, TAKE ONE CAPSULE BY MOUTH DAILY. (Patient taking differently:  TAKE 60 MG BY MOUTH DAILY.), Disp: 90 capsule, Rfl: 3 .  furosemide (LASIX) 40 MG tablet, TAKE ONE TABLET BY MOUTH ONCE DAILY., Disp: 90 tablet, Rfl: 1 .  gabapentin (NEURONTIN) 600 MG tablet, Take 600 mg by mouth 2 (two) times daily. , Disp: , Rfl:  .  ibuprofen (ADVIL,MOTRIN) 800 MG tablet, Take 800 mg by mouth 2 (two) times daily as needed for mild pain or moderate pain. Reported on 08/30/2015, Disp: , Rfl:  .  levothyroxine (SYNTHROID, LEVOTHROID) 100 MCG tablet, Take 1 tablet (100 mcg total) by mouth daily., Disp: 90 tablet, Rfl: 4 .  metroNIDAZOLE (METROGEL VAGINAL) 0.75 % vaginal gel, Nightly x 5 nights, Disp: 70 g, Rfl: 0 .  nystatin-triamcinolone ointment (MYCOLOG), Apply 1 application topically 2 (two) times daily. (Patient not taking: Reported on 04/10/2017), Disp: 30 g, Rfl: 11 .  ondansetron (ZOFRAN) 4 MG tablet, Take 1 tablet (4 mg total) by mouth every 8 (eight) hours as needed for nausea or vomiting., Disp: 60 tablet, Rfl: 3 .  oxyCODONE-acetaminophen (PERCOCET/ROXICET) 5-325 MG tablet, Take 1-2 tablets by mouth every 4 (four) hours as needed for severe pain., Disp: 30 tablet, Rfl: 0 .  potassium chloride (KLOR-CON 10) 10 MEQ tablet, Take 10 mEq by mouth every evening. Take one tablet daily as directed, Disp: , Rfl:  .  promethazine (PHENERGAN) 12.5 MG tablet, Take 1 tablet (12.5 mg total) by mouth every 4 (four) hours as needed for nausea or vomiting., Disp: 20 tablet, Rfl: 0 .  ranitidine (ZANTAC) 150 MG tablet, Take 1 tablet (150 mg total) by mouth at bedtime., Disp: 90 tablet, Rfl: 3 .  riTUXimab (RITUXAN) 100 MG/10ML injection, Inject into the vein. Every 4 months, Disp: , Rfl:  .  rOPINIRole (REQUIP) 1 MG tablet, Take 1 mg by mouth at bedtime., Disp: , Rfl:  .  warfarin (COUMADIN) 5 MG tablet, TAKE 1 & 1/2 TABLETS BY MOUTH DAILY EXCEPT TAKE 2 TABLETS ON TUESDAYS, THURSDAYS AND SATURDAYS, Disp: 60 tablet, Rfl: 6 .  zolpidem (AMBIEN) 10 MG tablet, Take 10 mg by mouth at bedtime. ,  Disp: , Rfl:   Allergies Methotrexate derivatives and Other  Review of Systems Review of Systems - Oncology ROS as per HPI otherwise 12 point ROS is negative.   Physical Exam  Vitals Wt Readings from Last 3 Encounters:  05/17/17 166 lb 11.2 oz (75.6 kg)  04/10/17 164 lb 3.2 oz (74.5 kg)  03/26/17 164 lb (74.4 kg)   Temp Readings from Last 3 Encounters:  05/17/17 98.2 F (36.8 C) (Oral)  05/15/17 97.7 F (36.5 C)  04/10/17 Marland Kitchen)  97.1 F (36.2 C) (Oral)   BP Readings from Last 3 Encounters:  05/17/17 115/71  05/15/17 113/71  04/10/17 107/75   Pulse Readings from Last 3 Encounters:  05/17/17 64  05/15/17 67  04/10/17 73   Constitutional: Well-developed, well-nourished, and in no distress.   HENT:  Head: Normocephalic and atraumatic.  Mouth/Throat: No oropharyngeal exudate. Mucosa moist. Eyes: Pupils are equal, round, and reactive to light. Conjunctivae are normal. No scleral icterus.  Neck: Normal range of motion. Neck supple. No JVD present.  Cardiovascular: Normal rate, regular rhythm and normal heart sounds.  Exam reveals no gallop and no friction rub.   No murmur heard. Pulmonary/Chest: Effort normal and breath sounds normal. No respiratory distress. No wheezes.No rales.  Abdominal: Soft. Bowel sounds are normal. No distension. There is no tenderness. There is no guarding.  Musculoskeletal: No edema or tenderness.  Lymphadenopathy: No cervical, axillary or supraclavicular adenopathy.  Neurological: Alert and oriented to person, place, and time. No cranial nerve deficit.  Skin: Skin is warm and dry. No rash noted. No erythema. No pallor.  Psychiatric: Affect and judgment normal.  Bilateral Breast exam:  Chaperone present.  Left breast reconstruction changes noted.  Right breast shows evidence of reduction.  No palpable masses noted.    Labs No visits with results within 3 Day(s) from this visit.  Latest known visit with results is:  Anti-coag visit on 05/06/2017    Component Date Value Ref Range Status  . INR 05/06/2017 4.8   Final     Pathology:     Left mastectomy surgical path: 04/20/10     Orders Placed This Encounter  Procedures  . MM Digital Screening Unilat R    Standing Status:   Future    Standing Expiration Date:   05/17/2018    Order Specific Question:   Reason for Exam (SYMPTOM  OR DIAGNOSIS REQUIRED)    Answer:   left breast cancer    Order Specific Question:   Is the patient pregnant?    Answer:   No    Order Specific Question:   Preferred imaging location?    Answer:   [facility]       Zoila Shutter MD

## 2017-05-17 NOTE — Patient Instructions (Addendum)
[place] at [facility] Discharge Instructions   You were seen today by Dr. Mathis Dad Higgs Right breast mammogram in October We will have you follow up with [place] in October and then we will have you follow up every year after that.    Thank you for choosing [place] at [facility] to provide your oncology and hematology care.  To afford each patient quality time with our provider, please arrive at least 15 minutes before your scheduled appointment time.    If you have a lab appointment with the [facility] please come in thru the  Main Entrance and check in at the main information desk  You need to re-schedule your appointment should you arrive 10 or more minutes late.  We strive to give you quality time with our providers, and arriving late affects you and other patients whose appointments are after yours.  Also, if you no show three or more times for appointments you may be dismissed from the clinic at the providers discretion.     Again, thank you for choosing [facility].  Our hope is that these requests will decrease the amount of time that you wait before being seen by our physicians.       _____________________________________________________________  Should you have questions after your visit to [facility], please contact our office at [phone] between the hours of 8:30 a.m. and 4:30 p.m.  Voicemails left after 4:30 p.m. will not be returned until the following business day.  For prescription refill requests, have your pharmacy contact our office.       Resources For Cancer Patients and their Caregivers ? American Cancer Society: Can assist with transportation, wigs, general needs, runs Look Good Feel Better.        450-221-3236 ? Cancer Care: Provides financial assistance, online support groups, medication/co-pay assistance.  1-800-813-HOPE 581-509-2472) ? [facility] Assists  [place] cancer patients and their families through emotional , educational and financial support.  (416)809-5110 ? [place] DSS Where to apply for food stamps, Medicaid and utility assistance. 469 270 3827 ? [employer]: Transportation to medical appointments. 406-651-2967 ? Social Security Administration: May apply for disability if have a Stage IV cancer. (216)005-3499 918-073-9410 ? [employer], Disability and Transit Services: Assists with nutrition, care and transit needs. [place] Support Programs:   > Cancer Support Group  2nd Tuesday of the month 1pm-2pm, Journey Room   > Creative Journey  3rd Tuesday of the month 1130am-1pm, Journey Room

## 2017-05-20 ENCOUNTER — Ambulatory Visit (INDEPENDENT_AMBULATORY_CARE_PROVIDER_SITE_OTHER): Payer: BLUE CROSS/BLUE SHIELD | Admitting: *Deleted

## 2017-05-20 DIAGNOSIS — D511 Vitamin B12 deficiency anemia due to selective vitamin B12 malabsorption with proteinuria: Secondary | ICD-10-CM | POA: Diagnosis not present

## 2017-05-20 DIAGNOSIS — E663 Overweight: Secondary | ICD-10-CM | POA: Diagnosis not present

## 2017-05-20 DIAGNOSIS — D6859 Other primary thrombophilia: Secondary | ICD-10-CM

## 2017-05-20 DIAGNOSIS — G894 Chronic pain syndrome: Secondary | ICD-10-CM | POA: Diagnosis not present

## 2017-05-20 DIAGNOSIS — Z5181 Encounter for therapeutic drug level monitoring: Secondary | ICD-10-CM

## 2017-05-20 DIAGNOSIS — Z1389 Encounter for screening for other disorder: Secondary | ICD-10-CM | POA: Diagnosis not present

## 2017-05-20 DIAGNOSIS — D510 Vitamin B12 deficiency anemia due to intrinsic factor deficiency: Secondary | ICD-10-CM | POA: Diagnosis not present

## 2017-05-20 DIAGNOSIS — Z6828 Body mass index (BMI) 28.0-28.9, adult: Secondary | ICD-10-CM | POA: Diagnosis not present

## 2017-05-20 DIAGNOSIS — Z952 Presence of prosthetic heart valve: Secondary | ICD-10-CM | POA: Diagnosis not present

## 2017-05-20 LAB — POCT INR: INR: 6.4

## 2017-05-20 NOTE — Patient Instructions (Signed)
Hold coumadin tonight and tomorrow night then resume 1 1/2 tablets daily except 2 tablets on Tuesdays, and Fridays Recheck in 1 weeks Scheduled for next dose of Rituxin on 4/10.

## 2017-05-23 DIAGNOSIS — R942 Abnormal results of pulmonary function studies: Secondary | ICD-10-CM | POA: Diagnosis not present

## 2017-05-23 DIAGNOSIS — M329 Systemic lupus erythematosus, unspecified: Secondary | ICD-10-CM | POA: Diagnosis not present

## 2017-05-23 DIAGNOSIS — J301 Allergic rhinitis due to pollen: Secondary | ICD-10-CM | POA: Diagnosis not present

## 2017-05-23 DIAGNOSIS — G609 Hereditary and idiopathic neuropathy, unspecified: Secondary | ICD-10-CM | POA: Diagnosis not present

## 2017-05-27 ENCOUNTER — Encounter: Payer: Self-pay | Admitting: Obstetrics & Gynecology

## 2017-05-27 ENCOUNTER — Ambulatory Visit (INDEPENDENT_AMBULATORY_CARE_PROVIDER_SITE_OTHER): Payer: BLUE CROSS/BLUE SHIELD | Admitting: *Deleted

## 2017-05-27 DIAGNOSIS — Z952 Presence of prosthetic heart valve: Secondary | ICD-10-CM

## 2017-05-27 DIAGNOSIS — Z5181 Encounter for therapeutic drug level monitoring: Secondary | ICD-10-CM

## 2017-05-27 LAB — POCT INR: INR: 2

## 2017-05-27 NOTE — Patient Instructions (Signed)
Continue coumadin 1 1/2 tablets daily except 2 tablets on Tuesdays, and Fridays Scheduled for next dose of Rituxin on 4/10.   Recheck on 06/03/17

## 2017-05-28 ENCOUNTER — Other Ambulatory Visit: Payer: Self-pay | Admitting: Obstetrics & Gynecology

## 2017-05-28 ENCOUNTER — Encounter: Payer: Self-pay | Admitting: Obstetrics & Gynecology

## 2017-05-28 MED ORDER — METRONIDAZOLE 0.75 % VA GEL: VAGINAL | 0 refills | Status: DC

## 2017-05-29 ENCOUNTER — Encounter (HOSPITAL_COMMUNITY)
Admission: RE | Admit: 2017-05-29 | Discharge: 2017-05-29 | Disposition: A | Discharge status: Home / Self Care | Payer: BLUE CROSS/BLUE SHIELD | Source: Ambulatory Visit | Attending: Rheumatology | Admitting: Rheumatology

## 2017-05-29 DIAGNOSIS — M069 Rheumatoid arthritis, unspecified: Secondary | ICD-10-CM | POA: Diagnosis not present

## 2017-05-29 MED ORDER — METHYLPREDNISOLONE SODIUM SUCC 125 MG IJ SOLR
125.0000 mg | Freq: Once | INTRAMUSCULAR | Status: AC
  Administered 2017-05-29: 125 mg via INTRAVENOUS
  Filled 2017-05-29: qty 2

## 2017-05-29 MED ORDER — SODIUM CHLORIDE 0.9 % IV SOLN
Freq: Once | INTRAVENOUS | Status: AC
  Administered 2017-05-29: 250 mL via INTRAVENOUS

## 2017-05-29 MED ORDER — SODIUM CHLORIDE 0.9 % IV SOLN
1000.0000 mg | Freq: Once | INTRAVENOUS | Status: AC
  Administered 2017-05-29: 1000 mg via INTRAVENOUS
  Filled 2017-05-29: qty 100

## 2017-05-29 MED ORDER — DIPHENHYDRAMINE HCL 50 MG/ML IJ SOLN
25.0000 mg | Freq: Once | INTRAMUSCULAR | Status: AC
  Administered 2017-05-29: 25 mg via INTRAVENOUS
  Filled 2017-05-29: qty 1

## 2017-05-29 MED ORDER — ACETAMINOPHEN 325 MG PO TABS
650.0000 mg | ORAL_TABLET | Freq: Once | ORAL | Status: AC
  Administered 2017-05-29: 650 mg via ORAL
  Filled 2017-05-29: qty 2

## 2017-06-03 ENCOUNTER — Ambulatory Visit (INDEPENDENT_AMBULATORY_CARE_PROVIDER_SITE_OTHER): Payer: BLUE CROSS/BLUE SHIELD | Admitting: *Deleted

## 2017-06-03 DIAGNOSIS — Z5181 Encounter for therapeutic drug level monitoring: Secondary | ICD-10-CM

## 2017-06-03 DIAGNOSIS — Z952 Presence of prosthetic heart valve: Secondary | ICD-10-CM

## 2017-06-03 DIAGNOSIS — Z7901 Long term (current) use of anticoagulants: Secondary | ICD-10-CM

## 2017-06-03 LAB — POCT INR: INR: 3.7

## 2017-06-03 NOTE — Patient Instructions (Signed)
Take coumadin 1/2 tablet tonight then resume 1 1/2 tablets daily except 2 tablets on Tuesdays, and Fridays Had Rituxin on 4/10.   Recheck on 06/17/17

## 2017-06-10 ENCOUNTER — Telehealth: Payer: Self-pay | Admitting: *Deleted

## 2017-06-10 NOTE — Telephone Encounter (Signed)
Patient states she used the Metrogel most recently prescribed and it worked while she was using it but discharge has started again. No odor or itching. Wants to know if there is something else she can try or does she need to come in for a visit? Please advise.  You may send mychart to patient as well.

## 2017-06-11 NOTE — Telephone Encounter (Signed)
Informed patient that I discussed with Dr Elonda Husky and he recommended she be seen.  Pt verbalized understanding and appt made.

## 2017-06-11 NOTE — Telephone Encounter (Signed)
Sounds like I need to take a look

## 2017-06-17 ENCOUNTER — Ambulatory Visit (INDEPENDENT_AMBULATORY_CARE_PROVIDER_SITE_OTHER): Payer: BLUE CROSS/BLUE SHIELD | Admitting: Obstetrics & Gynecology

## 2017-06-17 ENCOUNTER — Ambulatory Visit (INDEPENDENT_AMBULATORY_CARE_PROVIDER_SITE_OTHER): Payer: BLUE CROSS/BLUE SHIELD | Admitting: *Deleted

## 2017-06-17 ENCOUNTER — Encounter: Payer: Self-pay | Admitting: Obstetrics & Gynecology

## 2017-06-17 VITALS — BP 100/70 | HR 81 | Ht 64.0 in | Wt 159.5 lb

## 2017-06-17 DIAGNOSIS — Z5181 Encounter for therapeutic drug level monitoring: Secondary | ICD-10-CM | POA: Diagnosis not present

## 2017-06-17 DIAGNOSIS — Z952 Presence of prosthetic heart valve: Secondary | ICD-10-CM

## 2017-06-17 DIAGNOSIS — N72 Inflammatory disease of cervix uteri: Secondary | ICD-10-CM | POA: Diagnosis not present

## 2017-06-17 LAB — POCT INR: INR: 4.3

## 2017-06-17 MED ORDER — DOXYCYCLINE HYCLATE 100 MG PO TABS: 100.0000 mg | ORAL_TABLET | Freq: Two times a day (BID) | ORAL | 0 refills | Status: DC

## 2017-06-17 NOTE — Progress Notes (Signed)
       Chief Complaint  Patient presents with  . vaginal discharge    no odor    Blood pressure 100/70, pulse 81, height 5\' 4"  (1.626 m), weight 159 lb 8 oz (72.3 kg).  47 y.o. [id number] No LMP recorded. Patient has had an ablation. The current method of family planning is tubal ligation.  Subjective Vaginal discharge for 3weeks Itching no Irritation yes Odor no Similar to previous no  Previous treatment metrogel  Objective Vulva:  normal appearing vulva with no masses, tenderness or lesions Vagina:  normal mucosa, thin grey discharge Cervix:  Normal post conization Uterus:   Adnexa: ovaries:,       Pertinent ROS No burning with urination, frequency or urgency No nausea, vomiting or diarrhea Nor fever chills or other constitutional symptoms   Labs or studies Wet Prep:   A sample of vaginal discharge was obtained from the posterior fornix using a cotton swab. 2 drops of saline were placed on a slide and the cotton swab was immersed in the saline. Microscopic evaluation was performed and results were as follows:  Negative  for yeast  Negative for clue cells , consistent with Bacterial vaginosis Negative for trichomonas  Abnormal- high WBC population   Whiff test: Negative     Impression Diagnoses this Encounter::   ICD-10-CM   1. Cervicitis N72     Established relevant diagnosis(es):   Plan/Recommendations: Meds ordered this encounter  Medications  . doxycycline (VIBRA-TABS) 100 MG tablet    Sig: Take 1 tablet (100 mg total) by mouth 2 (two) times daily.    Dispense:  20 tablet    Refill:  0    Labs or Scans Ordered: No orders of the defined types were placed in this encounter.   Management:: Recheck 3 weeks after the doxycycline  Follow up Return in about 3 weeks (around 07/08/2017) for Follow up, with Dr Elonda Husky.      All questions were answered.

## 2017-06-17 NOTE — Patient Instructions (Signed)
Hold coumadin tonight then decrease dose to 1 1/2 tablets daily Had Rituxin on 4/10.   Recheck on 07/01/17

## 2017-06-18 DIAGNOSIS — D511 Vitamin B12 deficiency anemia due to selective vitamin B12 malabsorption with proteinuria: Secondary | ICD-10-CM | POA: Diagnosis not present

## 2017-06-18 DIAGNOSIS — E538 Deficiency of other specified B group vitamins: Secondary | ICD-10-CM | POA: Diagnosis not present

## 2017-06-18 DIAGNOSIS — G894 Chronic pain syndrome: Secondary | ICD-10-CM | POA: Diagnosis not present

## 2017-06-18 DIAGNOSIS — Z6827 Body mass index (BMI) 27.0-27.9, adult: Secondary | ICD-10-CM | POA: Diagnosis not present

## 2017-06-20 ENCOUNTER — Other Ambulatory Visit: Payer: Self-pay | Admitting: "Endocrinology

## 2017-06-20 DIAGNOSIS — E039 Hypothyroidism, unspecified: Secondary | ICD-10-CM | POA: Diagnosis not present

## 2017-06-20 LAB — TSH: TSH: 0.15 mIU/L — ABNORMAL LOW

## 2017-06-20 LAB — T4, FREE: Free T4: 1.8 ng/dL (ref 0.8–1.8)

## 2017-06-27 ENCOUNTER — Ambulatory Visit: Payer: BLUE CROSS/BLUE SHIELD | Admitting: "Endocrinology

## 2017-07-01 ENCOUNTER — Ambulatory Visit (INDEPENDENT_AMBULATORY_CARE_PROVIDER_SITE_OTHER): Payer: BLUE CROSS/BLUE SHIELD | Admitting: *Deleted

## 2017-07-01 DIAGNOSIS — Z1389 Encounter for screening for other disorder: Secondary | ICD-10-CM | POA: Diagnosis not present

## 2017-07-01 DIAGNOSIS — Z6827 Body mass index (BMI) 27.0-27.9, adult: Secondary | ICD-10-CM | POA: Diagnosis not present

## 2017-07-01 DIAGNOSIS — E663 Overweight: Secondary | ICD-10-CM | POA: Diagnosis not present

## 2017-07-01 DIAGNOSIS — L560 Drug phototoxic response: Secondary | ICD-10-CM | POA: Diagnosis not present

## 2017-07-01 DIAGNOSIS — Z1211 Encounter for screening for malignant neoplasm of colon: Secondary | ICD-10-CM

## 2017-07-01 DIAGNOSIS — Z5181 Encounter for therapeutic drug level monitoring: Secondary | ICD-10-CM | POA: Diagnosis not present

## 2017-07-01 DIAGNOSIS — D591 Autoimmune hemolytic anemia, unspecified: Secondary | ICD-10-CM

## 2017-07-01 DIAGNOSIS — Z952 Presence of prosthetic heart valve: Secondary | ICD-10-CM | POA: Diagnosis not present

## 2017-07-01 DIAGNOSIS — M329 Systemic lupus erythematosus, unspecified: Secondary | ICD-10-CM

## 2017-07-01 DIAGNOSIS — Z1212 Encounter for screening for malignant neoplasm of rectum: Secondary | ICD-10-CM

## 2017-07-01 DIAGNOSIS — R238 Other skin changes: Secondary | ICD-10-CM | POA: Diagnosis not present

## 2017-07-01 LAB — POCT INR: INR: 3.5

## 2017-07-01 NOTE — Patient Instructions (Signed)
Take coumadin 1/2 tablet tonight then resume 1 1/2 tablets daily Had Rituxin on 4/10.  Has been on doxycycline. Had reaction to the sun.  Had cortisone shot this  Recheck on 07/22/17

## 2017-07-04 ENCOUNTER — Other Ambulatory Visit: Payer: Self-pay | Admitting: "Endocrinology

## 2017-07-08 ENCOUNTER — Encounter: Payer: Self-pay | Admitting: "Endocrinology

## 2017-07-08 ENCOUNTER — Ambulatory Visit (INDEPENDENT_AMBULATORY_CARE_PROVIDER_SITE_OTHER): Payer: BLUE CROSS/BLUE SHIELD | Admitting: "Endocrinology

## 2017-07-08 VITALS — BP 132/87 | HR 67 | Ht 64.0 in | Wt 162.0 lb

## 2017-07-08 DIAGNOSIS — G894 Chronic pain syndrome: Secondary | ICD-10-CM | POA: Diagnosis not present

## 2017-07-08 DIAGNOSIS — E039 Hypothyroidism, unspecified: Secondary | ICD-10-CM

## 2017-07-08 DIAGNOSIS — L550 Sunburn of first degree: Secondary | ICD-10-CM | POA: Diagnosis not present

## 2017-07-08 DIAGNOSIS — Z6827 Body mass index (BMI) 27.0-27.9, adult: Secondary | ICD-10-CM | POA: Diagnosis not present

## 2017-07-08 DIAGNOSIS — D511 Vitamin B12 deficiency anemia due to selective vitamin B12 malabsorption with proteinuria: Secondary | ICD-10-CM | POA: Diagnosis not present

## 2017-07-08 DIAGNOSIS — Z1389 Encounter for screening for other disorder: Secondary | ICD-10-CM | POA: Diagnosis not present

## 2017-07-08 MED ORDER — LEVOTHYROXINE SODIUM 88 MCG PO TABS: 88.0000 ug | ORAL_TABLET | Freq: Every day | ORAL | 1 refills | Status: DC

## 2017-07-08 NOTE — Progress Notes (Signed)
HPI  [name] is a 47 y.o.-year-old female, here to f/u for hypothyroidism. She is on levothyroxine 100 mcg p.o. nightly.    She is compliant and taking her levothyroxine properly. She  has no new complaints, except that she is recovering from dermatologic side effects of doxycycline with acral cellulitis and hyperemia.  - She has a steady weight.   She denies heat intolerance.   ROS: Constitutional: + steady weight loss, no fatigue, no subjective hyperthermia/hypothermia Eyes: no blurry vision, no xerophthalmia ENT: no sore throat, no nodules palpated in throat, no dysphagia/odynophagia, no hoarseness Cardiovascular: no chest pain, no palpitations.   Respiratory: no cough/SOB Gastrointestinal: no N/V/D/C Musculoskeletal: no muscle/joint aches Skin: no rashes Neurological: no tremors/numbness/tingling/dizziness Psychiatric: no depression/anxiety  PE: BP 132/87   Pulse 67   Ht 5\' 4"  (1.626 m)   Wt 162 lb (73.5 kg)   BMI 27.81 kg/m  Wt Readings from Last 3 Encounters:  07/08/17 162 lb (73.5 kg)  06/17/17 159 lb 8 oz (72.3 kg)  05/17/17 166 lb 11.2 oz (75.6 kg)   Constitutional:  in NAD Eyes: PERRLA, EOMI, no exophthalmos ENT: moist mucous membranes, no thyromegaly, no cervical lymphadenopathy  Musculoskeletal: no deformities, strength intact in all 4 Skin: moist, warm, use hyperemia of feet and hands bilaterally  (photo dermatitis versus doxycycline skin reaction ) Neurological: no tremor with outstretched hands, DTR normal in all 4   Results for TAKEISHA, CIANCI (MRN [id number]) as of 07/08/2017 15:02  Ref. Range 06/20/2017 09:39  TSH Latest Units: mIU/L 0.15 (L)  T4,Free(Direct) Latest Ref Range: 0.8 - 1.8 ng/dL 1.8    ASSESSMENT: 1. Hypothyroidism  PLAN:   Her thyroid function tests are consistent with slight over replacement. -I discussed and lowered her levothyroxine to 88 mcg p.o. a.m.    - We discussed about correct intake of levothyroxine, at fasting,  with water, separated by at least 30 minutes from breakfast, and separated by more than 4 hours from calcium, iron, multivitamins, acid reflux medications (PPIs). -Patient is made aware of the fact that thyroid hormone replacement is needed for life, dose to be adjusted by periodic monitoring of thyroid function tests.  Regarding her skin reaction to antibiotics versus photodermatitis, I advised her to continue to follow-up with PMD.  Glade Lloyd, MD Phone: [phone]  Fax: [phone]  -  This note was partially dictated with voice recognition software. Similar sounding words can be transcribed inadequately or may not  be corrected upon review.  07/08/2017, 3:12 PM

## 2017-07-09 ENCOUNTER — Ambulatory Visit (INDEPENDENT_AMBULATORY_CARE_PROVIDER_SITE_OTHER): Payer: BLUE CROSS/BLUE SHIELD | Admitting: Obstetrics & Gynecology

## 2017-07-09 ENCOUNTER — Encounter: Payer: Self-pay | Admitting: Obstetrics & Gynecology

## 2017-07-09 VITALS — BP 120/68 | HR 90 | Ht 64.0 in | Wt 161.0 lb

## 2017-07-09 DIAGNOSIS — N72 Inflammatory disease of cervix uteri: Secondary | ICD-10-CM | POA: Diagnosis not present

## 2017-07-09 NOTE — Progress Notes (Signed)
       Chief Complaint  Patient presents with  . Follow-up    Blood pressure 120/68, pulse 90, height 5\' 4"  (1.626 m), weight 161 lb (73 kg).  47 y.o. [id number] No LMP recorded. Patient has had an ablation. The current method of family planning is tubal ligation.  Subjective Vaginal discharge for  Itching no Irritation no Odor no Similar to previous no  Previous treatment Doxycycline 100 BID  Objective Vulva:  normal appearing vulva with no masses, tenderness or lesions Vagina:  normal mucosa, no discharge Cervix:  no cervical motion tenderness and no lesions Uterus:  normal size, contour, position, consistency, mobility, non-tender Adnexa: ovaries:,       Pertinent ROS No burning with urination, frequency or urgency No nausea, vomiting or diarrhea Nor fever chills or other constitutional symptoms   Labs or studies Wet Prep:   A sample of vaginal discharge was obtained from the posterior fornix using a cotton swab. 2 drops of saline were placed on a slide and the cotton swab was immersed in the saline. Microscopic evaluation was performed and results were as follows:  Negative  for yeast  Negative for clue cells , consistent with Bacterial vaginosis Negative for trichomonas  Normal WBC population   Whiff test: Negative     Impression Diagnoses this Encounter::   ICD-10-CM   1. Cervicitis N72    resolved s/p doxycycline    Established relevant diagnosis(es):   Plan/Recommendations: No orders of the defined types were placed in this encounter.   Labs or Scans Ordered: No orders of the defined types were placed in this encounter.   Management:: Resolved cervicitis, no further therapy needed Follow up fo rher post conization Pap in 09/2017  Follow up Return in about 3 months (around 10/01/2017) for Pap smear post conization, with Dr Elonda Husky.       All questions were answered.

## 2017-07-17 ENCOUNTER — Other Ambulatory Visit: Payer: Self-pay | Admitting: Cardiology

## 2017-07-22 ENCOUNTER — Ambulatory Visit (INDEPENDENT_AMBULATORY_CARE_PROVIDER_SITE_OTHER): Payer: BLUE CROSS/BLUE SHIELD | Admitting: *Deleted

## 2017-07-22 DIAGNOSIS — Z952 Presence of prosthetic heart valve: Secondary | ICD-10-CM

## 2017-07-22 DIAGNOSIS — Z5181 Encounter for therapeutic drug level monitoring: Secondary | ICD-10-CM | POA: Diagnosis not present

## 2017-07-22 LAB — POCT INR: INR: 2.9 (ref 2.0–3.0)

## 2017-07-22 NOTE — Patient Instructions (Signed)
Continue coumadin 1 1/2 tablets daily Recheck in 4 weeks . 

## 2017-07-22 NOTE — Progress Notes (Signed)
Cardiology Office Note  Date: 07/23/2017   ID: [name], DOB 1971-02-14, MRN [id number]  PCP: Sharilyn Sites, MD  Primary Cardiologist: Rozann Lesches, MD   Chief Complaint  Patient presents with  . Mitral valve disease    History of Present Illness: [name] is a 47 y.o. female last seen in December 2018.  She is here for a routine follow-up visit.  Overall doing well, recently got back from a cruise to the [place].  She does not report any unusual shortness of breath with typical activities, no chest pain.  She has noticed intermittent episodes of rapid palpitations, generally lasting less than 30 seconds, not associated with lightheadedness or syncope.  She cannot identify any specific trigger.  This is happening as much as several times within a week.  She continues to follow in the anticoagulation clinic on Coumadin.  INR yesterday was 2.9.  She has had no bleeding problems.  I personally reviewed her ECG today which shows sinus rhythm with R' in lead V1.  Past Medical History:  Diagnosis Date  . Abnormal Papanicolaou smear of cervix with positive human papilloma virus (HPV) test 08/17/2015  . Anticoagulation goal of INR 2.5 to 3.5   . Anxiety   . B12 deficiency   . BRCA1 negative   . BRCA2 negative   . Breast cancer, left breast ([place]) 2011   S/P mastectomy; chemo; "no radiation due to lupus"  . Bursitis of right knee    Septic bursitis  . Chronic lower back pain   . Drug-induced hepatitis    States per her rheumatologist, transaminases elevated but normalized after drug removed for   . Fibromyalgia   . GERD (gastroesophageal reflux disease)   . 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 History of abnormal cervical Pap smear 08/12/2015  . History of blood transfusion   . HSIL (high grade squamous intraepithelial lesion) on Pap smear of cervix 01/14/2017   colpo with Dr Elonda Husky   . Hypothyroidism   . Mitral valve disease,  rheumatic    St. Jude prosthesis  . Peripheral neuropathy   . Pneumonia   . Raynaud phenomenon   . SLE (systemic lupus erythematosus) ([place])     Past Surgical History:  Procedure Laterality Date  . APPENDECTOMY N/A 05/23/2016   Procedure: OPEN APPENDECTOMY WITH DRAINAGE OF ABSCESS;  Surgeon: Fanny Skates, MD;  Location: [place];  Service: General;  Laterality: N/A;  . APPENDECTOMY    . BREAST BIOPSY Left 2012  . BREAST IMPLANT EXCHANGE Left 12/2015  . CARDIAC CATHETERIZATION  2008  . CARDIAC VALVE REPLACEMENT    . CERVICAL CONIZATION W/BX N/A 03/06/2017   Procedure: Laser Conization of Cervix;  Surgeon: Florian Buff, MD;  Location: [facility];  Service: Gynecology;  Laterality: N/A;  . ENDOMETRIAL ABLATION  2008   She no longer has menses  . ESOPHAGOGASTRODUODENOSCOPY N/A 03/25/2013   Dr. Raliegh Scarlet reflux esophagitis-likely source of patient's symptoms (patulous EG junction). Hiatal hernia otherwise normal  . LAPAROSCOPIC CHOLECYSTECTOMY    . LIVER BIOPSY  11/28/2016   liver core/notes 11/28/2016  . MASTECTOMY Left 2012  . MASTOPEXY  02/14/2011   Procedure: MASTOPEXY;  Surgeon: Macon Large;  Location: [facility];  Service: Plastics;  Laterality: Bilateral;  Right Breast Reduction   . MITRAL VALVE REPLACEMENT  2008  . TISSUE EXPANDER PLACEMENT  02/14/2011   Procedure: TISSUE EXPANDER;  Surgeon: Macon Large;  Location: [facility];  Service:  Plastics;  Laterality: Bilateral;  Left Breast Remove Tissue Expander Placement of Implant Breast Reconstruction  . TISSUE EXPANDER PLACEMENT Left 11/16/2010   breasst  . TUBAL LIGATION      Current Outpatient Medications  Medication Sig Dispense Refill  . ALPRAZolam (XANAX) 0.5 MG tablet Take 0.5 mg by mouth daily as needed for anxiety or sleep.     . cyanocobalamin (,VITAMIN B-12,) 1000 MCG/ML injection Inject 1 mL (1,000 mcg total) into the muscle every 30 (thirty) days. 10 mL prn  . DEXILANT 60 MG capsule TAKE ONE CAPSULE BY MOUTH DAILY.  (Patient taking differently: TAKE 60 MG BY MOUTH DAILY.) 90 capsule 3  . furosemide (LASIX) 40 MG tablet TAKE ONE TABLET BY MOUTH ONCE DAILY. 90 tablet 3  . gabapentin (NEURONTIN) 600 MG tablet Take 600 mg by mouth 2 (two) times daily.     Marland Kitchen ibuprofen (ADVIL,MOTRIN) 800 MG tablet Take 800 mg by mouth 2 (two) times daily as needed for mild pain or moderate pain. Reported on 08/30/2015    . levothyroxine (SYNTHROID, LEVOTHROID) 88 MCG tablet Take 1 tablet (88 mcg total) by mouth daily. 90 tablet 1  . ondansetron (ZOFRAN) 4 MG tablet Take 1 tablet (4 mg total) by mouth every 8 (eight) hours as needed for nausea or vomiting. 60 tablet 3  . oxyCODONE-acetaminophen (PERCOCET/ROXICET) 5-325 MG tablet Take 1-2 tablets by mouth every 4 (four) hours as needed for severe pain. 30 tablet 0  . potassium chloride (KLOR-CON 10) 10 MEQ tablet Take 10 mEq by mouth every evening. Take one tablet daily as directed    . promethazine (PHENERGAN) 12.5 MG tablet Take 1 tablet (12.5 mg total) by mouth every 4 (four) hours as needed for nausea or vomiting. 20 tablet 0  . ranitidine (ZANTAC) 150 MG tablet Take 1 tablet (150 mg total) by mouth at bedtime. 90 tablet 3  . riTUXimab (RITUXAN) 100 MG/10ML injection Inject into the vein. Every 4 months    . rOPINIRole (REQUIP) 1 MG tablet Take 1 mg by mouth at bedtime.    Marland Kitchen warfarin (COUMADIN) 5 MG tablet TAKE 1 & 1/2 TABLETS BY MOUTH DAILY EXCEPT TAKE 2 TABLETS ON TUESDAYS, THURSDAYS AND SATURDAYS 60 tablet 6  . zolpidem (AMBIEN) 10 MG tablet Take 10 mg by mouth at bedtime.      No current facility-administered medications for this visit.    Allergies:  Methotrexate derivatives and Other   Social History: The patient  reports that she quit smoking about 11 years ago. Her smoking use included cigarettes. She has a 15.00 pack-year smoking history. She has never used smokeless tobacco. She reports that she drinks alcohol. She reports that she does not use drugs.   ROS:  Please  see the history of present illness. Otherwise, complete review of systems is positive for none.  All other systems are reviewed and negative.   Physical Exam: VS:  BP 124/72 (BP Location: Left Arm)   Pulse 70   Ht 5' 4"  (1.626 m)   Wt 161 lb (73 kg)   SpO2 97%   BMI 27.64 kg/m , BMI Body mass index is 27.64 kg/m.  Wt Readings from Last 3 Encounters:  07/23/17 161 lb (73 kg)  07/09/17 161 lb (73 kg)  07/08/17 162 lb (73.5 kg)    General: Patient appears comfortable at rest. HEENT: Conjunctiva and lids normal, oropharynx clear. Neck: Supple, no elevated JVP or carotid bruits, no thyromegaly. Lungs: Clear to auscultation, nonlabored breathing at rest. Cardiac:  Regular rate and rhythm, no S3, prosthetic valve sound and S1, no pericardial rub. Abdomen: Soft, nontender, bowel sounds present. Extremities: No pitting edema, distal pulses 2+. Skin: Warm and dry. Musculoskeletal: No kyphosis. Neuropsychiatric: Alert and oriented x3, affect grossly appropriate.  ECG: I personally reviewed the tracing from 05/23/2016 which showed sinus rhythm with rightward axis.  Recent Labwork: 03/01/2017: ALT 15; AST 18 03/16/2017: BUN 12; Creatinine, Ser 0.77; Potassium 3.4; Sodium 137 03/18/2017: Hemoglobin 11.6; Platelets 252 06/20/2017: TSH 0.15     Component Value Date/Time   CHOL 229 (H) 09/18/2010 1156   TRIG 111 09/18/2010 1156   HDL 47 09/18/2010 1156   CHOLHDL 4.9 09/18/2010 1156   VLDL 22 09/18/2010 1156   LDLCALC 160 (H) 09/18/2010 1156    Other Studies Reviewed Today:  Echocardiogram 07/17/2016: Study Conclusions  - Left ventricle: The cavity size was normal. Wall thickness was normal. Systolic function was normal. The estimated ejection fraction was in the range of 55% to 60%. Wall motion was normal; there were no regional wall motion abnormalities. Features are consistent with a pseudonormal left ventricular filling pattern, with concomitant abnormal relaxation and  increased filling pressure (grade 2 diastolic dysfunction). - Aortic valve: Mildly calcified annulus. Trileaflet. Valve area (VTI): 1.93 cm^2. Valve area (Vmax): 1.72 cm^2. Valve area (Vmean): 1.78 cm^2. - Mitral valve: A St. Jude Medical mechanical prosthesis was present. There was trivial regurgitation. Mean gradient (D): 3 mm Hg. - Left atrium: The atrium was mildly to moderately dilated. - Right atrium: Central venous pressure (est): 3 mm Hg. - Tricuspid valve: There was trivial regurgitation. - Pulmonary arteries: PA peak pressure: 17 mm Hg (S). - Pericardium, extracardiac: A prominent pericardial fat pad was present.  Impressions:  - Normal LV wall thickness with LVEF 55-60% and grade 2 diastolic dysfunction. Mild to moderate left atrial enlargement. St. Jude mechanical prosthesis in mitral position with normal gradient. Trivial tricuspid regurgitation with PASP estimated 17 mmHg.  Assessment and Plan:  1.  Intermittent rapid palpitations as described above.  ECG today shows sinus rhythm.  We will provide a 7-day event monitor to further investigate any potential paroxysmal arrhythmias.  2.  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  She has done well, follow-up echocardiogram from last year showed stable valve function and LVEF 55 to 60%.  Keep follow-up in the anticoagulation clinic on Coumadin.  3.  History of lupus.  She continues to follow with rheumatology.  Current medicines were reviewed with the patient today.   Orders Placed This Encounter  Procedures  . Cardiac event monitor  . EKG 12-Lead    Disposition: 53-monthfollow-up, sooner if needed.  Signed, SSatira Sark MD, FSsm [facility]6/05/2017 1:52 PM    [facility] [place] [address]Phone: [phone] Fax: [phone]

## 2017-07-23 ENCOUNTER — Encounter: Payer: Self-pay | Admitting: Cardiology

## 2017-07-23 ENCOUNTER — Ambulatory Visit (INDEPENDENT_AMBULATORY_CARE_PROVIDER_SITE_OTHER): Payer: BLUE CROSS/BLUE SHIELD | Admitting: Cardiology

## 2017-07-23 VITALS — BP 124/72 | HR 70 | Ht 64.0 in | Wt 161.0 lb

## 2017-07-23 DIAGNOSIS — R002 Palpitations: Secondary | ICD-10-CM | POA: Diagnosis not present

## 2017-07-23 DIAGNOSIS — Z952 Presence of prosthetic heart valve: Secondary | ICD-10-CM

## 2017-07-23 NOTE — Patient Instructions (Signed)
Medication Instructions:  Your physician recommends that you continue on your current medications as directed. Please refer to the Current Medication list given to you today.   Labwork: NONE  Testing/Procedures: Your physician has recommended that you wear an event monitor. Event monitors are medical devices that record the heart's electrical activity. Doctors most often [place] these monitors to diagnose arrhythmias. Arrhythmias are problems with the speed or rhythm of the heartbeat. The monitor is a small, portable device. You can wear one while you do your normal daily activities. This is usually used to diagnose what is causing palpitations/syncope (passing out). (7 DAY)  Follow-Up: Your physician wants you to follow-up in: 6 MONTHS. You will receive a reminder letter in the mail two months in advance. If you don't receive a letter, please call our office to schedule the follow-up appointment.   Any Other Special Instructions Will Be Listed Below (If Applicable).     If you need a refill on your cardiac medications before your next appointment, please call your pharmacy.

## 2017-07-29 ENCOUNTER — Ambulatory Visit (INDEPENDENT_AMBULATORY_CARE_PROVIDER_SITE_OTHER): Payer: BLUE CROSS/BLUE SHIELD

## 2017-07-29 DIAGNOSIS — R002 Palpitations: Secondary | ICD-10-CM | POA: Diagnosis not present

## 2017-07-30 ENCOUNTER — Telehealth: Payer: Self-pay | Admitting: *Deleted

## 2017-07-30 NOTE — Telephone Encounter (Signed)
Spoke with pt regarding [facility] cardiac report from day Day 1 -Critical. Patient states at time of recording she was in the bed sleep and did not have any symptoms.

## 2017-08-02 ENCOUNTER — Telehealth: Payer: Self-pay | Admitting: Cardiology

## 2017-08-02 NOTE — Telephone Encounter (Addendum)
[facility] called to state that patient has NSR followed by a 10 beat run of wide complex rhythm that was somewhat irregular.  Patient completely asymptomatic during episode.  Normal LVF on echo a year ago.  Rhythm strip reviewed ? Atrial arrhythmia with aberration.  Strips will be sent to office for further review.

## 2017-08-04 NOTE — Telephone Encounter (Signed)
I reviewed the available strips which at this point I think are SVT or AF with aberrancy. I also asked Dr. Lovena Le to review them and he thought they were consistent with atrial tachycardia with couplets. Would continue to monitor.

## 2017-08-05 ENCOUNTER — Other Ambulatory Visit: Payer: Self-pay | Admitting: *Deleted

## 2017-08-05 DIAGNOSIS — R002 Palpitations: Secondary | ICD-10-CM

## 2017-08-12 ENCOUNTER — Telehealth: Payer: Self-pay

## 2017-08-12 DIAGNOSIS — I059 Rheumatic mitral valve disease, unspecified: Secondary | ICD-10-CM

## 2017-08-12 MED ORDER — METOPROLOL SUCCINATE ER 25 MG PO TB24: 12.5000 mg | ORAL_TABLET | Freq: Every day | ORAL | 3 refills | Status: DC

## 2017-08-12 NOTE — Telephone Encounter (Signed)
-----   Message from Satira Sark, MD sent at 08/12/2017  8:52 AM EDT ----- Thanks Carleene Overlie.  LVEF was normal by echocardiogram last June.  Will repeat study to make sure no change.  ----- Message ----- From: Evans Lance, MD Sent: 08/12/2017   8:49 AM To: Satira Sark, MD  Sam, I have reviewed the strips. Not sure if VT or SVT, though I suspect the former. Beside the low dose beta blocker, would make sure no structural heart disease. Either with echo or MRI. GT ----- Message ----- From: Satira Sark, MD Sent: 08/11/2017   3:24 PM To: Evans Lance, MD, Bernita Raisin, RN  Results reviewed.  Brief bursts of possible SVT or atrial tachycardia with aberrancy noted.  There were no sustained events.  We will send final recorded strips to Dr. Lovena Le for review.  Would initiate Toprol-XL 12.5 mg daily, we can uptitrate from there depending on symptom control. A copy of this test should be forwarded to Sharilyn Sites, MD.

## 2017-08-12 NOTE — Telephone Encounter (Signed)
Pt notified, copied pcp 

## 2017-08-12 NOTE — Telephone Encounter (Signed)
notfied pt , copied pcp, e-scribed toprol to [employer]

## 2017-08-12 NOTE — Telephone Encounter (Signed)
-----   Message from Satira Sark, MD sent at 08/11/2017  3:24 PM EDT ----- Results reviewed.  Brief bursts of possible SVT or atrial tachycardia with aberrancy noted.  There were no sustained events.  We will send final recorded strips to Dr. Lovena Le for review.  Would initiate Toprol-XL 12.5 mg daily, we can uptitrate from there depending on symptom control. A copy of this test should be forwarded to Sharilyn Sites, MD.

## 2017-08-13 ENCOUNTER — Telehealth: Payer: Self-pay | Admitting: Cardiology

## 2017-08-13 NOTE — Telephone Encounter (Signed)
Returned pt call to get more details. She states that she was put on Toprol XL in the past for Raynaud's Syndrome (she is not sure what the dosage was) , but her blood pressure dropped significantly and was taken off of it. She states that she generally runs a low/normal blood pressure (100/60) and is wondering if the Toprol XL 25 would cause it to go too low. She has not started  the medication yet, and  wanted to know Dr. Myles Gip thoughts prior to picking up the prescription. Please advise.

## 2017-08-13 NOTE — Telephone Encounter (Signed)
Pt made aware of recommendations. She voiced understanding of plan.

## 2017-08-13 NOTE — Telephone Encounter (Signed)
I am glad she let us know about that.  She is having very brief increase in heart rate related to heart rhythm change, it is not atrial fibrillation however.  As long as her heart pumping function remains stable, this is most likely something we can watch and treat with low-dose beta-blocker.  I would try Toprol-XL at 12.5 mg daily, perhaps taking in the evening.  Hopefully her blood pressure will tolerate this and she will notice an improvement in her palpitations.

## 2017-08-15 ENCOUNTER — Other Ambulatory Visit: Payer: Self-pay | Admitting: Gastroenterology

## 2017-08-15 DIAGNOSIS — E663 Overweight: Secondary | ICD-10-CM | POA: Diagnosis not present

## 2017-08-15 DIAGNOSIS — Z1389 Encounter for screening for other disorder: Secondary | ICD-10-CM | POA: Diagnosis not present

## 2017-08-15 DIAGNOSIS — G894 Chronic pain syndrome: Secondary | ICD-10-CM | POA: Diagnosis not present

## 2017-08-15 DIAGNOSIS — E538 Deficiency of other specified B group vitamins: Secondary | ICD-10-CM | POA: Diagnosis not present

## 2017-08-15 DIAGNOSIS — Z6827 Body mass index (BMI) 27.0-27.9, adult: Secondary | ICD-10-CM | POA: Diagnosis not present

## 2017-08-19 ENCOUNTER — Ambulatory Visit (INDEPENDENT_AMBULATORY_CARE_PROVIDER_SITE_OTHER): Payer: BLUE CROSS/BLUE SHIELD | Admitting: *Deleted

## 2017-08-19 ENCOUNTER — Ambulatory Visit (HOSPITAL_COMMUNITY)
Admission: RE | Admit: 2017-08-19 | Discharge: 2017-08-19 | Disposition: A | Discharge status: Home / Self Care | Payer: BLUE CROSS/BLUE SHIELD | Source: Ambulatory Visit | Attending: Cardiology | Admitting: Cardiology

## 2017-08-19 DIAGNOSIS — I059 Rheumatic mitral valve disease, unspecified: Secondary | ICD-10-CM

## 2017-08-19 DIAGNOSIS — Z5181 Encounter for therapeutic drug level monitoring: Secondary | ICD-10-CM

## 2017-08-19 DIAGNOSIS — Z9012 Acquired absence of left breast and nipple: Secondary | ICD-10-CM | POA: Diagnosis not present

## 2017-08-19 DIAGNOSIS — Z7901 Long term (current) use of anticoagulants: Secondary | ICD-10-CM | POA: Diagnosis not present

## 2017-08-19 DIAGNOSIS — Z48812 Encounter for surgical aftercare following surgery on the circulatory system: Secondary | ICD-10-CM | POA: Insufficient documentation

## 2017-08-19 DIAGNOSIS — Z952 Presence of prosthetic heart valve: Secondary | ICD-10-CM | POA: Insufficient documentation

## 2017-08-19 DIAGNOSIS — Z853 Personal history of malignant neoplasm of breast: Secondary | ICD-10-CM | POA: Diagnosis not present

## 2017-08-19 DIAGNOSIS — D6859 Other primary thrombophilia: Secondary | ICD-10-CM | POA: Diagnosis not present

## 2017-08-19 LAB — POCT INR: INR: 5.4 — AB (ref 2.0–3.0)

## 2017-08-19 NOTE — Progress Notes (Signed)
*  PRELIMINARY RESULTS* Echocardiogram 2D Echocardiogram has been performed.  [name] 08/19/2017, 9:22 AM

## 2017-08-19 NOTE — Patient Instructions (Signed)
Hold coumadin tonight, take 1/2 tablet tomorrow night then resume 1 1/2 tablets daily Recheck in 10 days

## 2017-08-28 ENCOUNTER — Ambulatory Visit (INDEPENDENT_AMBULATORY_CARE_PROVIDER_SITE_OTHER): Payer: BLUE CROSS/BLUE SHIELD | Admitting: *Deleted

## 2017-08-28 DIAGNOSIS — Z952 Presence of prosthetic heart valve: Secondary | ICD-10-CM | POA: Diagnosis not present

## 2017-08-28 DIAGNOSIS — Z5181 Encounter for therapeutic drug level monitoring: Secondary | ICD-10-CM

## 2017-08-28 LAB — POCT INR: INR: 2.1 (ref 2.0–3.0)

## 2017-08-28 NOTE — Patient Instructions (Signed)
Take coumadin 2 tablets tonight and tomorrow night then resume 1 1/2 tablets daily Recheck in 2 weeks

## 2017-09-02 DIAGNOSIS — M2550 Pain in unspecified joint: Secondary | ICD-10-CM | POA: Diagnosis not present

## 2017-09-02 DIAGNOSIS — M0609 Rheumatoid arthritis without rheumatoid factor, multiple sites: Secondary | ICD-10-CM | POA: Diagnosis not present

## 2017-09-02 DIAGNOSIS — Z79899 Other long term (current) drug therapy: Secondary | ICD-10-CM | POA: Diagnosis not present

## 2017-09-02 DIAGNOSIS — M329 Systemic lupus erythematosus, unspecified: Secondary | ICD-10-CM | POA: Diagnosis not present

## 2017-09-13 DIAGNOSIS — D511 Vitamin B12 deficiency anemia due to selective vitamin B12 malabsorption with proteinuria: Secondary | ICD-10-CM | POA: Diagnosis not present

## 2017-09-13 DIAGNOSIS — Z6828 Body mass index (BMI) 28.0-28.9, adult: Secondary | ICD-10-CM | POA: Diagnosis not present

## 2017-09-13 DIAGNOSIS — E663 Overweight: Secondary | ICD-10-CM | POA: Diagnosis not present

## 2017-09-13 DIAGNOSIS — G894 Chronic pain syndrome: Secondary | ICD-10-CM | POA: Diagnosis not present

## 2017-09-13 DIAGNOSIS — Z1389 Encounter for screening for other disorder: Secondary | ICD-10-CM | POA: Diagnosis not present

## 2017-09-16 ENCOUNTER — Ambulatory Visit (INDEPENDENT_AMBULATORY_CARE_PROVIDER_SITE_OTHER): Payer: BLUE CROSS/BLUE SHIELD | Admitting: *Deleted

## 2017-09-16 DIAGNOSIS — Z5181 Encounter for therapeutic drug level monitoring: Secondary | ICD-10-CM | POA: Diagnosis not present

## 2017-09-16 DIAGNOSIS — D6859 Other primary thrombophilia: Secondary | ICD-10-CM

## 2017-09-16 DIAGNOSIS — Z952 Presence of prosthetic heart valve: Secondary | ICD-10-CM | POA: Diagnosis not present

## 2017-09-16 LAB — POCT INR: INR: 2.7 (ref 2.0–3.0)

## 2017-09-16 NOTE — Patient Instructions (Signed)
Continue coumadin 1 1/2 tablets daily Recheck in 3 weeks 

## 2017-09-30 ENCOUNTER — Encounter (HOSPITAL_COMMUNITY): Admission: RE | Admit: 2017-09-30 | Payer: BLUE CROSS/BLUE SHIELD | Source: Ambulatory Visit

## 2017-10-08 DIAGNOSIS — E663 Overweight: Secondary | ICD-10-CM | POA: Diagnosis not present

## 2017-10-08 DIAGNOSIS — Z1389 Encounter for screening for other disorder: Secondary | ICD-10-CM | POA: Diagnosis not present

## 2017-10-08 DIAGNOSIS — E538 Deficiency of other specified B group vitamins: Secondary | ICD-10-CM | POA: Diagnosis not present

## 2017-10-08 DIAGNOSIS — G894 Chronic pain syndrome: Secondary | ICD-10-CM | POA: Diagnosis not present

## 2017-10-08 DIAGNOSIS — Z6828 Body mass index (BMI) 28.0-28.9, adult: Secondary | ICD-10-CM | POA: Diagnosis not present

## 2017-10-09 ENCOUNTER — Ambulatory Visit (INDEPENDENT_AMBULATORY_CARE_PROVIDER_SITE_OTHER): Payer: BLUE CROSS/BLUE SHIELD | Admitting: Gastroenterology

## 2017-10-09 ENCOUNTER — Ambulatory Visit (INDEPENDENT_AMBULATORY_CARE_PROVIDER_SITE_OTHER): Payer: BLUE CROSS/BLUE SHIELD | Admitting: *Deleted

## 2017-10-09 ENCOUNTER — Encounter: Payer: Self-pay | Admitting: Gastroenterology

## 2017-10-09 ENCOUNTER — Other Ambulatory Visit: Payer: Self-pay

## 2017-10-09 VITALS — BP 103/76 | HR 66 | Temp 97.8°F | Ht 64.0 in | Wt 163.0 lb

## 2017-10-09 DIAGNOSIS — Z5181 Encounter for therapeutic drug level monitoring: Secondary | ICD-10-CM | POA: Diagnosis not present

## 2017-10-09 DIAGNOSIS — Z952 Presence of prosthetic heart valve: Secondary | ICD-10-CM | POA: Diagnosis not present

## 2017-10-09 DIAGNOSIS — K3184 Gastroparesis: Secondary | ICD-10-CM

## 2017-10-09 DIAGNOSIS — Z1211 Encounter for screening for malignant neoplasm of colon: Secondary | ICD-10-CM

## 2017-10-09 DIAGNOSIS — Z1212 Encounter for screening for malignant neoplasm of rectum: Secondary | ICD-10-CM

## 2017-10-09 LAB — POCT INR: INR: 5.2 — AB (ref 2.0–3.0)

## 2017-10-09 MED ORDER — PROMETHAZINE HCL 12.5 MG PO TABS: 12.5000 mg | ORAL_TABLET | Freq: Four times a day (QID) | ORAL | 11 refills | Status: AC | PRN

## 2017-10-09 MED ORDER — ONDANSETRON HCL 4 MG PO TABS: 4.0000 mg | ORAL_TABLET | Freq: Three times a day (TID) | ORAL | 11 refills | Status: AC | PRN

## 2017-10-09 NOTE — Progress Notes (Signed)
Referring Provider: Sharilyn Sites, MD Primary Care Physician:  Sharilyn Sites, MD Primary GI: Dr. Gala Romney   Chief Complaint  Patient presents with  . gastroparesis    doing well    HPI:   [name] is a 47 y.o. female presenting today with a history of erosive esophagitis and gastroparesis. Has done well on low dose Reglan BID historically, PPI. Zofran now covered by insurance and keeps on hand if needed. Unable to take erythromycin as she is on chronic Coumadin.Was on methotrexate for about a year then taken off due to elevated LFTs.From review of labs, it appears this may have been in 2015. 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 Discussed with patient. She has completely normal LFTs. Will need to follow transaminases and immunoglobulins. Consistent with AASLD, treatment is recommended with IgG greater than twice upper limit of normal if transaminases are at least fivefold upper limit of normal, with no treatment recommended if IgG less than twice upper limit of normal unless transaminases are greater than 10 fold. Will need to be monitored serially. Discussed second opinion, and she declines this. Bump in LFTs in past were likely secondary to methotrexate. She is on multiple medications now.LFTs remaining normal on multiple draws.   Celiac serologies have been negative historically. Continues with Dexilant.Zofran and Phenergan as needed. Zofran helps better. Needs refills on this. No abdominal pain. Appetite good. Just came back from a cruise. Feels well overall. No dysphagia. No concerning lower GI symptoms. Grandson getting ready to start kindergarten. Needs initial screening colonoscopy next year.    Past Medical History:  Diagnosis Date  . Abnormal Papanicolaou smear of cervix with positive human papilloma virus (HPV) test 08/17/2015  . Anticoagulation goal of INR 2.5 to 3.5   . Anxiety   . B12 deficiency   . BRCA1 negative   . BRCA2 negative   . Breast cancer, left breast ([place]) 2011   S/P mastectomy; chemo; "no radiation due to lupus"  . Bursitis of right knee    Septic bursitis  . Chronic lower back pain   . Drug-induced hepatitis    States per her rheumatologist, transaminases elevated but normalized after drug removed for   . Fibromyalgia   . GERD (gastroesophageal reflux disease)   . 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 History of abnormal cervical Pap smear 08/12/2015  . History of blood transfusion   . HSIL (high grade squamous intraepithelial lesion) on Pap smear of cervix 01/14/2017   colpo with Dr Elonda Husky   . Hypothyroidism   . Mitral valve disease, rheumatic    St. Jude prosthesis  . Peripheral neuropathy   . Pneumonia   . Raynaud phenomenon   . SLE (systemic lupus erythematosus) ([place])     Past Surgical History:  Procedure Laterality Date  . APPENDECTOMY N/A 05/23/2016   Procedure: OPEN APPENDECTOMY WITH DRAINAGE OF ABSCESS;  Surgeon: Fanny Skates, MD;  Location: [facility];  Service: General;  Laterality: N/A;  . APPENDECTOMY    . BREAST BIOPSY Left 2012  . BREAST IMPLANT EXCHANGE Left 12/2015  . CARDIAC CATHETERIZATION  2008  . CARDIAC VALVE REPLACEMENT    . CERVICAL CONIZATION W/BX N/A 03/06/2017   Procedure: Laser Conization of Cervix;  Surgeon:  Florian Buff, MD;  Location: [facility];  Service: Gynecology;  Laterality: N/A;  . ENDOMETRIAL ABLATION  2008   She no longer has menses  . ESOPHAGOGASTRODUODENOSCOPY N/A 03/25/2013   Dr. Raliegh Scarlet reflux esophagitis-likely source of patient's symptoms (patulous EG junction). Hiatal hernia otherwise normal  . LAPAROSCOPIC CHOLECYSTECTOMY    . LIVER BIOPSY  11/28/2016   liver  core/notes 11/28/2016  . MASTECTOMY Left 2012  . MASTOPEXY  02/14/2011   Procedure: MASTOPEXY;  Surgeon: Macon Large;  Location: [facility] [facility];  Service: Plastics;  Laterality: Bilateral;  Right Breast Reduction   . MITRAL VALVE REPLACEMENT  2008  . TISSUE EXPANDER PLACEMENT  02/14/2011   Procedure: TISSUE EXPANDER;  Surgeon: Macon Large;  Location: [facility];  Service: Plastics;  Laterality: Bilateral;  Left Breast Remove Tissue Expander Placement of Implant Breast Reconstruction  . TISSUE EXPANDER PLACEMENT Left 11/16/2010   breasst  . TUBAL LIGATION      Current Outpatient Medications  Medication Sig Dispense Refill  . ALPRAZolam (XANAX) 0.5 MG tablet Take 0.5 mg by mouth daily as needed for anxiety or sleep.     . cyanocobalamin (,VITAMIN B-12,) 1000 MCG/ML injection Inject 1 mL (1,000 mcg total) into the muscle every 30 (thirty) days. 10 mL prn  . DEXILANT 60 MG capsule TAKE ONE CAPSULE BY MOUTH DAILY. 90 capsule 3  . furosemide (LASIX) 40 MG tablet TAKE ONE TABLET BY MOUTH ONCE DAILY. 90 tablet 3  . gabapentin (NEURONTIN) 600 MG tablet Take 600 mg by mouth 2 (two) times daily.     Marland Kitchen ibuprofen (ADVIL,MOTRIN) 800 MG tablet Take 800 mg by mouth 2 (two) times daily as needed for mild pain or moderate pain. Reported on 08/30/2015    . levothyroxine (SYNTHROID, LEVOTHROID) 88 MCG tablet Take 1 tablet (88 mcg total) by mouth daily. 90 tablet 1  . metoprolol succinate (TOPROL XL) 25 MG 24 hr tablet Take 0.5 tablets (12.5 mg total) by mouth daily. 45 tablet 3  . ondansetron (ZOFRAN) 4 MG tablet Take 1 tablet (4 mg total) by mouth every 8 (eight) hours as needed for nausea or vomiting. 60 tablet 3  . oxyCODONE-acetaminophen (PERCOCET/ROXICET) 5-325 MG tablet Take 1-2 tablets by mouth every 4 (four) hours as needed for severe pain. 30 tablet 0  . potassium chloride (KLOR-CON 10) 10 MEQ tablet Take 10 mEq by mouth every evening. Take one tablet daily as directed    . promethazine (PHENERGAN) 12.5 MG  tablet Take 1 tablet (12.5 mg total) by mouth every 4 (four) hours as needed for nausea or vomiting. 20 tablet 0  . ranitidine (ZANTAC) 150 MG tablet Take 1 tablet (150 mg total) by mouth at bedtime. 90 tablet 3  . riTUXimab (RITUXAN) 100 MG/10ML injection Inject into the vein. Every 4 months    . rOPINIRole (REQUIP) 1 MG tablet Take 1 mg by mouth at bedtime.    Marland Kitchen warfarin (COUMADIN) 5 MG tablet TAKE 1 & 1/2 TABLETS BY MOUTH DAILY EXCEPT TAKE 2 TABLETS ON TUESDAYS, THURSDAYS AND SATURDAYS 60 tablet 6  . zolpidem (AMBIEN) 10 MG tablet Take 10 mg by mouth at bedtime.      No current facility-administered medications for this visit.     Allergies as of 10/09/2017 - Review Complete 10/09/2017  Allergen Reaction Noted  . Methotrexate derivatives Other (See Comments) 11/29/2016  . Other  05/25/2016    Family History  Problem Relation Age of Onset  . Hypertension Mother   . Hyperlipidemia  Mother   . Hypertension Father   . Colon cancer Neg Hx     Social History   Socioeconomic History  . Marital status: Married    Spouse name: Not on file  . Number of children: 2  . Years of education: Not on file  . Highest education level: Not on file  Occupational History  . Not on file  Social Needs  . Financial resource strain: Not on file  . Food insecurity:    Worry: Not on file    Inability: Not on file  . Transportation needs:    Medical: Not on file    Non-medical: Not on file  Tobacco Use  . Smoking status: Former Smoker    Packs/day: 1.00    Years: 15.00    Pack years: 15.00    Types: Cigarettes    Last attempt to quit: 02/19/2006    Years since quitting: 11.6  . Smokeless tobacco: Never Used  Substance and Sexual Activity  . Alcohol use: Yes    Alcohol/week: 0.0 standard drinks    Comment: 11/28/2016 "couple drinks/yearZ"  . Drug use: No  . Sexual activity: Not Currently    Birth control/protection: Surgical    Comment: tubal and ablation  Lifestyle  . Physical  activity:    Days per week: Not on file    Minutes per session: Not on file  . Stress: Not on file  Relationships  . Social connections:    Talks on phone: Not on file    Gets together: Not on file    Attends religious service: Not on file    Active member of club or organization: Not on file    Attends meetings of clubs or organizations: Not on file    Relationship status: Not on file  Other Topics Concern  . Not on file  Social History Narrative  . Not on file    Review of Systems: Gen: Denies fever, chills, anorexia. Denies fatigue, weakness, weight loss.  CV: Denies chest pain, palpitations, syncope, peripheral edema, and claudication. Resp: Denies dyspnea at rest, cough, wheezing, coughing up blood, and pleurisy. GI: see HPI Derm: Denies rash, itching, dry skin Psych: Denies depression, anxiety, memory loss, confusion. No homicidal or suicidal ideation.  Heme: Denies bruising, bleeding, and enlarged lymph nodes.  Physical Exam: BP 103/76   Pulse 66   Temp 97.8 F (36.6 C) (Oral)   Ht 5' 4"  (1.626 m)   Wt 163 lb (73.9 kg)   BMI 27.98 kg/m  General:   Alert and oriented. No distress noted. Pleasant and cooperative.  Head:  Normocephalic and atraumatic. Eyes:  Conjuctiva clear without scleral icterus. Mouth:  Oral mucosa pink and moist. Good dentition. No lesions. Abdomen:  +BS, soft, non-tender and non-distended. No rebound or guarding. No HSM or masses noted. Msk:  Symmetrical without gross deformities. Normal posture. Extremities:  Without edema. Neurologic:  Alert and  oriented x4 Psych:  Alert and cooperative. Normal mood and affect.

## 2017-10-09 NOTE — Patient Instructions (Addendum)
I refilled Zofran and Phenergan for you to take as needed.  Continue with Reglan twice a day.  Please call me if you need anything at all!  We will recheck liver numbers around January 2020.   I will see you in 1 year or before if needed! You will be due for a colonoscopy at that time :)   It was a pleasure to see you today. I strive to create trusting relationships with patients to provide genuine, compassionate, and quality care. I value your feedback. If you receive a survey regarding your visit,  I greatly appreciate you taking time to fill this out.   Annitta Needs, PhD, ANP-BC [facility]

## 2017-10-09 NOTE — Patient Instructions (Signed)
Description   Skip today and tomorrow's dose then Continue coumadin 1 1/2 tablets daily Recheck in 2 weeks

## 2017-10-10 ENCOUNTER — Ambulatory Visit (INDEPENDENT_AMBULATORY_CARE_PROVIDER_SITE_OTHER): Payer: BLUE CROSS/BLUE SHIELD | Admitting: Obstetrics & Gynecology

## 2017-10-10 ENCOUNTER — Other Ambulatory Visit (HOSPITAL_COMMUNITY)
Admission: RE | Admit: 2017-10-10 | Discharge: 2017-10-10 | Disposition: A | Discharge status: Home / Self Care | Payer: BLUE CROSS/BLUE SHIELD | Source: Ambulatory Visit | Attending: Obstetrics & Gynecology | Admitting: Obstetrics & Gynecology

## 2017-10-10 ENCOUNTER — Encounter: Payer: Self-pay | Admitting: Obstetrics & Gynecology

## 2017-10-10 VITALS — BP 122/79 | HR 62 | Ht 64.0 in | Wt 163.0 lb

## 2017-10-10 DIAGNOSIS — R87810 Cervical high risk human papillomavirus (HPV) DNA test positive: Secondary | ICD-10-CM | POA: Diagnosis not present

## 2017-10-10 DIAGNOSIS — R87613 High grade squamous intraepithelial lesion on cytologic smear of cervix (HGSIL): Secondary | ICD-10-CM | POA: Diagnosis not present

## 2017-10-10 DIAGNOSIS — Z87898 Personal history of other specified conditions: Secondary | ICD-10-CM

## 2017-10-10 DIAGNOSIS — R87610 Atypical squamous cells of undetermined significance on cytologic smear of cervix (ASC-US): Secondary | ICD-10-CM | POA: Insufficient documentation

## 2017-10-10 DIAGNOSIS — Z8742 Personal history of other diseases of the female genital tract: Secondary | ICD-10-CM

## 2017-10-10 NOTE — Progress Notes (Signed)
Patient ID: [name], female   DOB: [birth year], 47 y.o.   MRN: [id number]      Chief Complaint  Patient presents with  . Pap smear-post conization      47 y.o. [id number] No LMP recorded. Patient has had an ablation. The current method of family planning is tubal ligation.  Outpatient Encounter Medications as of 10/10/2017  Medication Sig Note  . ALPRAZolam (XANAX) 0.5 MG tablet Take 0.5 mg by mouth daily as needed for anxiety or sleep.    . cyanocobalamin (,VITAMIN B-12,) 1000 MCG/ML injection Inject 1 mL (1,000 mcg total) into the muscle every 30 (thirty) days. 11/29/2016: 3rd of every month  . DEXILANT 60 MG capsule TAKE ONE CAPSULE BY MOUTH DAILY.   . furosemide (LASIX) 40 MG tablet TAKE ONE TABLET BY MOUTH ONCE DAILY.   Marland Kitchen ibuprofen (ADVIL,MOTRIN) 800 MG tablet Take 800 mg by mouth 2 (two) times daily as needed for mild pain or moderate pain. Reported on 08/30/2015   . levothyroxine (SYNTHROID, LEVOTHROID) 88 MCG tablet Take 1 tablet (88 mcg total) by mouth daily.   . metoprolol succinate (TOPROL XL) 25 MG 24 hr tablet Take 0.5 tablets (12.5 mg total) by mouth daily.   . ondansetron (ZOFRAN) 4 MG tablet Take 1 tablet (4 mg total) by mouth every 8 (eight) hours as needed for nausea or vomiting.   Marland Kitchen oxyCODONE-acetaminophen (PERCOCET/ROXICET) 5-325 MG tablet Take 1-2 tablets by mouth every 4 (four) hours as needed for severe pain.   . potassium chloride (KLOR-CON 10) 10 MEQ tablet Take 10 mEq by mouth every evening. Take one tablet daily as directed   . promethazine (PHENERGAN) 12.5 MG tablet Take 1 tablet (12.5 mg total) by mouth every 6 (six) hours as needed for nausea or vomiting.   . ranitidine (ZANTAC) 150 MG tablet Take 1 tablet (150 mg total) by mouth at bedtime.   . riTUXimab (RITUXAN) 100 MG/10ML injection Inject into the vein. Every 4 months 11/29/2016: Per pt next dose due 12/18/16  . rOPINIRole (REQUIP) 1 MG tablet Take 1 mg by mouth at bedtime.   Marland Kitchen warfarin (COUMADIN) 5 MG  tablet TAKE 1 & 1/2 TABLETS BY MOUTH DAILY EXCEPT TAKE 2 TABLETS ON TUESDAYS, THURSDAYS AND SATURDAYS 03/06/2017: Restart evening Thursday 03/07/2017  . zolpidem (AMBIEN) 10 MG tablet Take 10 mg by mouth at bedtime.    . [DISCONTINUED] gabapentin (NEURONTIN) 600 MG tablet Take 600 mg by mouth 2 (two) times daily.     No facility-administered encounter medications on file as of 10/10/2017.     Subjective [name] in for her first post conization Pap S/p laser conization for HSIL with negative margins 02/2017 No complaints Past Medical History:  Diagnosis Date  . Abnormal Papanicolaou smear of cervix with positive human papilloma virus (HPV) test 08/17/2015  . Abnormality of heart beat   . Anticoagulation goal of INR 2.5 to 3.5   . Anxiety   . B12 deficiency   . BRCA1 negative   . BRCA2 negative   . Breast cancer, left breast ([place]) 2011   S/P mastectomy; chemo; "no radiation due to lupus"  . Bursitis of right knee    Septic bursitis  . Chronic lower back pain   . Drug-induced hepatitis    States per her rheumatologist, transaminases elevated but normalized after drug removed for   . Fibromyalgia   . GERD (gastroesophageal reflux disease)   . 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  History of abnormal cervical Pap smear 08/12/2015  . History of blood transfusion   . HSIL (high grade squamous intraepithelial lesion) on Pap smear of cervix 01/14/2017   colpo with Dr Elonda Husky   . Hypothyroidism   . Mitral valve disease, rheumatic    St. Jude prosthesis  . Peripheral neuropathy   . Pneumonia   . Raynaud phenomenon   . SLE (systemic lupus erythematosus) ([place])     Past Surgical History:  Procedure Laterality Date  . APPENDECTOMY N/A 05/23/2016   Procedure: OPEN APPENDECTOMY WITH DRAINAGE OF ABSCESS;  Surgeon: Fanny Skates, MD;  Location: [place];  Service: General;  Laterality: N/A;  . APPENDECTOMY    . BREAST BIOPSY Left 2012  . BREAST IMPLANT  EXCHANGE Left 12/2015  . CARDIAC CATHETERIZATION  2008  . CARDIAC VALVE REPLACEMENT    . CERVICAL CONIZATION W/BX N/A 03/06/2017   Procedure: Laser Conization of Cervix;  Surgeon: Florian Buff, MD;  Location: [facility];  Service: Gynecology;  Laterality: N/A;  . ENDOMETRIAL ABLATION  2008   She no longer has menses  . ESOPHAGOGASTRODUODENOSCOPY N/A 03/25/2013   Dr. Raliegh Scarlet reflux esophagitis-likely source of patient's symptoms (patulous EG junction). Hiatal hernia otherwise normal  . LAPAROSCOPIC CHOLECYSTECTOMY    . LIVER BIOPSY  11/28/2016   liver core/notes 11/28/2016  . MASTECTOMY Left 2012  . MASTOPEXY  02/14/2011   Procedure: MASTOPEXY;  Surgeon: Macon Large;  Location: [place];  Service: Plastics;  Laterality: Bilateral;  Right Breast Reduction   . MITRAL VALVE REPLACEMENT  2008  . TISSUE EXPANDER PLACEMENT  02/14/2011   Procedure: TISSUE EXPANDER;  Surgeon: Macon Large;  Location: [facility];  Service: Plastics;  Laterality: Bilateral;  Left Breast Remove Tissue Expander Placement of Implant Breast Reconstruction  . TISSUE EXPANDER PLACEMENT Left 11/16/2010   breasst  . TUBAL LIGATION      OB History    Gravida  2   Para  2   Term  0   Preterm      AB      Living  2     SAB      TAB      Ectopic      Multiple      Live Births  2           Allergies  Allergen Reactions  . Methotrexate Derivatives Other (See Comments)    Raised liver enzymes   . 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 kph    Social History   Socioeconomic History  . Marital status: Married    Spouse name: Not on file  . Number of children: 2  . Years of education: Not on file  . Highest education level: Not on file  Occupational History  . Not on file  Social Needs  . Financial resource strain: Not on file  . Food insecurity:    Worry: Not on file    Inability: Not on file   . Transportation needs:    Medical: Not on file    Non-medical: Not on file  Tobacco Use  . Smoking status: Former Smoker    Packs/day: 1.00    Years: 15.00    Pack years: 15.00    Types: Cigarettes    Last attempt to quit: 02/19/2006    Years since quitting: 11.6  . Smokeless tobacco: Never  Used  Substance and Sexual Activity  . Alcohol use: Yes    Alcohol/week: 0.0 standard drinks    Comment: 11/28/2016 "couple drinks/yearZ"  . Drug use: No  . Sexual activity: Not Currently    Birth control/protection: Surgical    Comment: tubal and ablation  Lifestyle  . Physical activity:    Days per week: Not on file    Minutes per session: Not on file  . Stress: Not on file  Relationships  . Social connections:    Talks on phone: Not on file    Gets together: Not on file    Attends religious service: Not on file    Active member of club or organization: Not on file    Attends meetings of clubs or organizations: Not on file    Relationship status: Not on file  Other Topics Concern  . Not on file  Social History Narrative  . Not on file    Family History  Problem Relation Age of Onset  . Hypertension Mother   . Hyperlipidemia Mother   . Hypertension Father   . Colon cancer Neg Hx     Medications:       Current Outpatient Medications:  .  ALPRAZolam (XANAX) 0.5 MG tablet, Take 0.5 mg by mouth daily as needed for anxiety or sleep. , Disp: , Rfl:  .  cyanocobalamin (,VITAMIN B-12,) 1000 MCG/ML injection, Inject 1 mL (1,000 mcg total) into the muscle every 30 (thirty) days., Disp: 10 mL, Rfl: prn .  DEXILANT 60 MG capsule, TAKE ONE CAPSULE BY MOUTH DAILY., Disp: 90 capsule, Rfl: 3 .  furosemide (LASIX) 40 MG tablet, TAKE ONE TABLET BY MOUTH ONCE DAILY., Disp: 90 tablet, Rfl: 3 .  ibuprofen (ADVIL,MOTRIN) 800 MG tablet, Take 800 mg by mouth 2 (two) times daily as needed for mild pain or moderate pain. Reported on 08/30/2015, Disp: , Rfl:  .  levothyroxine (SYNTHROID, LEVOTHROID) 88  MCG tablet, Take 1 tablet (88 mcg total) by mouth daily., Disp: 90 tablet, Rfl: 1 .  metoprolol succinate (TOPROL XL) 25 MG 24 hr tablet, Take 0.5 tablets (12.5 mg total) by mouth daily., Disp: 45 tablet, Rfl: 3 .  ondansetron (ZOFRAN) 4 MG tablet, Take 1 tablet (4 mg total) by mouth every 8 (eight) hours as needed for nausea or vomiting., Disp: 60 tablet, Rfl: 11 .  oxyCODONE-acetaminophen (PERCOCET/ROXICET) 5-325 MG tablet, Take 1-2 tablets by mouth every 4 (four) hours as needed for severe pain., Disp: 30 tablet, Rfl: 0 .  potassium chloride (KLOR-CON 10) 10 MEQ tablet, Take 10 mEq by mouth every evening. Take one tablet daily as directed, Disp: , Rfl:  .  promethazine (PHENERGAN) 12.5 MG tablet, Take 1 tablet (12.5 mg total) by mouth every 6 (six) hours as needed for nausea or vomiting., Disp: 60 tablet, Rfl: 11 .  ranitidine (ZANTAC) 150 MG tablet, Take 1 tablet (150 mg total) by mouth at bedtime., Disp: 90 tablet, Rfl: 3 .  riTUXimab (RITUXAN) 100 MG/10ML injection, Inject into the vein. Every 4 months, Disp: , Rfl:  .  rOPINIRole (REQUIP) 1 MG tablet, Take 1 mg by mouth at bedtime., Disp: , Rfl:  .  warfarin (COUMADIN) 5 MG tablet, TAKE 1 & 1/2 TABLETS BY MOUTH DAILY EXCEPT TAKE 2 TABLETS ON TUESDAYS, THURSDAYS AND SATURDAYS, Disp: 60 tablet, Rfl: 6 .  zolpidem (AMBIEN) 10 MG tablet, Take 10 mg by mouth at bedtime. , Disp: , Rfl:   Objective Blood pressure 122/79, pulse 62, height 5'  4" (1.626 m), weight 163 lb (73.9 kg).  General WDWN female NAD Vulva:  normal appearing vulva with no masses, tenderness or lesions Vagina:  normal mucosa, no discharge Cervix:  no bleeding following Pap  Pap done    Pertinent ROS   Labs or studies     Impression Diagnoses this Encounter::   ICD-10-CM   1. HSIL (high grade squamous intraepithelial lesion) on Pap smear of cervix R87.613   2. History of abnormal cervical Pap smear Z87.898 Cytology - PAP    Established relevant  diagnosis(es):   Plan/Recommendations: No orders of the defined types were placed in this encounter.   Labs or Scans Ordered: No orders of the defined types were placed in this encounter.   Management:: Pap 1/4 over 2 years done today Follow up 6 months for her next Pap test  Follow up Return in about 6 months (around 04/12/2018) for Pap, with Dr Elonda Husky.       All questions were answered.

## 2017-10-14 LAB — CYTOLOGY - PAP
CHLAMYDIA, DNA PROBE: NEGATIVE
Diagnosis: UNDETERMINED — AB
HPV: DETECTED — AB
Neisseria Gonorrhea: NEGATIVE

## 2017-10-14 NOTE — Assessment & Plan Note (Signed)
47 year old female continuing to do well on Dexilant, Zofran and phenergan prn, dietary/behavior modification, and Reglan low dose BID. No side effects noted. No alarm features. She has been doing well for quite some time, so we will have her return in 1 year or sooner as needed. Initial screening colonoscopy in 2020. Due to history of elevated LFTs in remote past, check HFP in Jan 2020.

## 2017-10-15 NOTE — Progress Notes (Signed)
cc'd to pcp 

## 2017-10-25 ENCOUNTER — Ambulatory Visit (INDEPENDENT_AMBULATORY_CARE_PROVIDER_SITE_OTHER): Payer: BLUE CROSS/BLUE SHIELD | Admitting: *Deleted

## 2017-10-25 DIAGNOSIS — Z5181 Encounter for therapeutic drug level monitoring: Secondary | ICD-10-CM

## 2017-10-25 DIAGNOSIS — Z952 Presence of prosthetic heart valve: Secondary | ICD-10-CM | POA: Diagnosis not present

## 2017-10-25 DIAGNOSIS — D6859 Other primary thrombophilia: Secondary | ICD-10-CM | POA: Diagnosis not present

## 2017-10-25 LAB — POCT INR: INR: 3.2 — AB (ref 2.0–3.0)

## 2017-10-25 NOTE — Patient Instructions (Signed)
Continue coumadin 1 1/2 tablets daily Recheck in 3 weeks 

## 2017-11-08 ENCOUNTER — Other Ambulatory Visit: Payer: Self-pay | Admitting: Gastroenterology

## 2017-11-12 DIAGNOSIS — E663 Overweight: Secondary | ICD-10-CM | POA: Diagnosis not present

## 2017-11-12 DIAGNOSIS — Z1389 Encounter for screening for other disorder: Secondary | ICD-10-CM | POA: Diagnosis not present

## 2017-11-12 DIAGNOSIS — E538 Deficiency of other specified B group vitamins: Secondary | ICD-10-CM | POA: Diagnosis not present

## 2017-11-12 DIAGNOSIS — Z6827 Body mass index (BMI) 27.0-27.9, adult: Secondary | ICD-10-CM | POA: Diagnosis not present

## 2017-11-12 DIAGNOSIS — G894 Chronic pain syndrome: Secondary | ICD-10-CM | POA: Diagnosis not present

## 2017-11-15 ENCOUNTER — Ambulatory Visit (INDEPENDENT_AMBULATORY_CARE_PROVIDER_SITE_OTHER): Payer: BLUE CROSS/BLUE SHIELD | Admitting: *Deleted

## 2017-11-15 DIAGNOSIS — Z5181 Encounter for therapeutic drug level monitoring: Secondary | ICD-10-CM | POA: Diagnosis not present

## 2017-11-15 DIAGNOSIS — D6859 Other primary thrombophilia: Secondary | ICD-10-CM

## 2017-11-15 DIAGNOSIS — Z952 Presence of prosthetic heart valve: Secondary | ICD-10-CM | POA: Diagnosis not present

## 2017-11-15 LAB — POCT INR: INR: 3.8 — AB (ref 2.0–3.0)

## 2017-11-15 NOTE — Patient Instructions (Signed)
Take coumadin 1/2 tablet tonight then resume 1 1/2 tablets daily Recheck in 3 weeks

## 2017-11-19 ENCOUNTER — Encounter (HOSPITAL_COMMUNITY): Payer: Self-pay

## 2017-11-19 ENCOUNTER — Ambulatory Visit (HOSPITAL_COMMUNITY): Payer: BLUE CROSS/BLUE SHIELD | Admitting: Hematology

## 2017-11-19 ENCOUNTER — Encounter (HOSPITAL_COMMUNITY)
Admission: RE | Admit: 2017-11-19 | Discharge: 2017-11-19 | Disposition: A | Discharge status: Home / Self Care | Payer: BLUE CROSS/BLUE SHIELD | Source: Ambulatory Visit | Attending: Rheumatology | Admitting: Rheumatology

## 2017-11-19 DIAGNOSIS — M069 Rheumatoid arthritis, unspecified: Secondary | ICD-10-CM | POA: Diagnosis not present

## 2017-11-19 MED ORDER — DIPHENHYDRAMINE HCL 50 MG/ML IJ SOLN
25.0000 mg | Freq: Once | INTRAMUSCULAR | Status: AC
  Administered 2017-11-19: 25 mg via INTRAVENOUS
  Filled 2017-11-19: qty 1

## 2017-11-19 MED ORDER — METHYLPREDNISOLONE SODIUM SUCC 125 MG IJ SOLR
125.0000 mg | Freq: Once | INTRAMUSCULAR | Status: AC
  Administered 2017-11-19: 125 mg via INTRAVENOUS
  Filled 2017-11-19: qty 2

## 2017-11-19 MED ORDER — SODIUM CHLORIDE 0.9 % IV SOLN
Freq: Once | INTRAVENOUS | Status: AC
  Administered 2017-11-19: 09:00:00 via INTRAVENOUS

## 2017-11-19 MED ORDER — ACETAMINOPHEN 325 MG PO TABS
650.0000 mg | ORAL_TABLET | Freq: Once | ORAL | Status: AC
  Administered 2017-11-19: 650 mg via ORAL
  Filled 2017-11-19: qty 2

## 2017-11-19 MED ORDER — SODIUM CHLORIDE 0.9 % IV SOLN
1000.0000 mg | Freq: Once | INTRAVENOUS | Status: AC
  Administered 2017-11-19: 1000 mg via INTRAVENOUS
  Filled 2017-11-19: qty 100

## 2017-11-19 NOTE — Addendum Note (Signed)
Encounter addended by: Holley Dexter, RN on: 11/19/2017 10:42 AM  Actions taken: Order Reconciliation Section accessed

## 2017-11-25 ENCOUNTER — Other Ambulatory Visit (HOSPITAL_COMMUNITY): Payer: Self-pay | Admitting: Internal Medicine

## 2017-11-25 ENCOUNTER — Ambulatory Visit (HOSPITAL_COMMUNITY)
Admission: RE | Admit: 2017-11-25 | Discharge: 2017-11-25 | Disposition: A | Discharge status: Home / Self Care | Payer: BLUE CROSS/BLUE SHIELD | Source: Ambulatory Visit | Attending: Internal Medicine | Admitting: Internal Medicine

## 2017-11-25 DIAGNOSIS — Z1231 Encounter for screening mammogram for malignant neoplasm of breast: Secondary | ICD-10-CM | POA: Diagnosis not present

## 2017-11-25 DIAGNOSIS — C50412 Malignant neoplasm of upper-outer quadrant of left female breast: Secondary | ICD-10-CM | POA: Diagnosis not present

## 2017-11-25 DIAGNOSIS — Z171 Estrogen receptor negative status [ER-]: Secondary | ICD-10-CM | POA: Diagnosis not present

## 2017-11-25 NOTE — Progress Notes (Signed)
[place] [place], [place] [address]   CLINIC:  Medical Oncology/Hematology  PCP:  Sharilyn Sites, MD 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  Triple negative with Ki-67 of 92%.    06/05/2010 - 08/28/2010 Chemotherapy    TC x 4 cycles      CANCER STAGING: Cancer Staging Malignant neoplasm of upper outer quadrant of female breast ([place]) Staging form: Breast, AJCC 7th Edition - Clinical: Stage IIA (T2, N0, cM0) - Signed by Baird Cancer, PA on 08/16/2010 - Pathologic: No stage assigned - Unsigned    INTERVAL HISTORY:  Ms. Dissinger 47 y.o. female returns for routine follow-up for breast cancer and hemolytic anemia. She has no complaints. She had her mammogram yesterday. She denies any new pain or lumps present. Denies any easy bruising or bleeding. Denies any headaches or vision changes. Her appetite is 100% and energy level is 75%. She will have labs done on her way out and we will see her in a year. If she has any changes she will call and schedule an earlier appointment.     REVIEW OF SYSTEMS:  Review of Systems  All other systems reviewed and are negative.    PAST MEDICAL/SURGICAL HISTORY:  Past Medical History:  Diagnosis Date  . 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 Abnormality of heart beat   . Anticoagulation goal of INR 2.5 to 3.5   . Anxiety   . B12 deficiency   . BRCA1 negative   . BRCA2 negative   . Breast cancer, left breast ([place]) 2011   S/P mastectomy; chemo; "no radiation due to lupus"  . Bursitis of right knee    Septic bursitis  . Chronic lower back pain   . Drug-induced hepatitis    States per her rheumatologist, transaminases elevated but normalized after drug removed for   . Fibromyalgia   . GERD (gastroesophageal reflux disease)   . 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 History of abnormal cervical Pap smear 08/12/2015  . History of blood transfusion   . HSIL (high grade squamous intraepithelial lesion) on Pap smear of cervix 01/14/2017   colpo with Dr Elonda Husky   . Hypothyroidism   . Mitral valve disease, rheumatic    St. Jude prosthesis  . Peripheral neuropathy   . Pneumonia   . Raynaud phenomenon   . SLE (systemic lupus erythematosus) ([place] [facility])    Past Surgical History:  Procedure Laterality Date  . APPENDECTOMY N/A 05/23/2016   Procedure: OPEN APPENDECTOMY WITH DRAINAGE OF ABSCESS;  Surgeon: Fanny Skates, MD;  Location: [facility];  Service: General;  Laterality: N/A;  . APPENDECTOMY    . BREAST BIOPSY Left 2012  . BREAST IMPLANT EXCHANGE Left 12/2015  .  CARDIAC CATHETERIZATION  2008  . CARDIAC VALVE REPLACEMENT    . CERVICAL CONIZATION W/BX N/A 03/06/2017   Procedure: Laser Conization of Cervix;  Surgeon: Florian Buff, MD;  Location: [facility];  Service: Gynecology;  Laterality: N/A;  . ENDOMETRIAL ABLATION  2008   She no longer has menses  . ESOPHAGOGASTRODUODENOSCOPY N/A 03/25/2013   Dr. Raliegh Scarlet reflux esophagitis-likely source of patient's symptoms (patulous EG junction). Hiatal hernia otherwise normal  . LAPAROSCOPIC CHOLECYSTECTOMY    . LIVER BIOPSY  11/28/2016   liver core/notes 11/28/2016  . MASTECTOMY Left 2012  . MASTOPEXY  02/14/2011    Procedure: MASTOPEXY;  Surgeon: Macon Large;  Location: [facility];  Service: Plastics;  Laterality: Bilateral;  Right Breast Reduction   . MITRAL VALVE REPLACEMENT  2008  . TISSUE EXPANDER PLACEMENT  02/14/2011   Procedure: TISSUE EXPANDER;  Surgeon: Macon Large;  Location: [facility];  Service: Plastics;  Laterality: Bilateral;  Left Breast Remove Tissue Expander Placement of Implant Breast Reconstruction  . TISSUE EXPANDER PLACEMENT Left 11/16/2010   breasst  . TUBAL LIGATION       SOCIAL HISTORY:  Social History   Socioeconomic History  . Marital status: Married    Spouse name: Not on file  . Number of children: 2  . Years of education: Not on file  . Highest education level: Not on file  Occupational History  . Not on file  Social Needs  . Financial resource strain: Not on file  . Food insecurity:    Worry: Not on file    Inability: Not on file  . Transportation needs:    Medical: Not on file    Non-medical: Not on file  Tobacco Use  . Smoking status: Former Smoker    Packs/day: 1.00    Years: 15.00    Pack years: 15.00    Types: Cigarettes    Last attempt to quit: 02/19/2006    Years since quitting: 11.7  . Smokeless tobacco: Never Used  Substance and Sexual Activity  . Alcohol use: Yes    Alcohol/week: 0.0 standard drinks    Comment: 11/28/2016 "couple drinks/yearZ"  . Drug use: No  . Sexual activity: Not Currently    Birth control/protection: Surgical    Comment: tubal and ablation  Lifestyle  . Physical activity:    Days per week: Not on file    Minutes per session: Not on file  . Stress: Not on file  Relationships  . Social connections:    Talks on phone: Not on file    Gets together: Not on file    Attends religious service: Not on file    Active member of club or organization: Not on file    Attends meetings of clubs or organizations: Not on file    Relationship status: Not on file  . Intimate partner violence:    Fear of current or ex partner: Not on  file    Emotionally abused: Not on file    Physically abused: Not on file    Forced sexual activity: Not on file  Other Topics Concern  . Not on file  Social History Narrative  . Not on file    FAMILY HISTORY:  Family History  Problem Relation Age of Onset  . Hypertension Mother   . Hyperlipidemia Mother   . Hypertension Father   . Colon cancer Neg Hx     CURRENT MEDICATIONS:  Outpatient Encounter Medications as of 11/26/2017  Medication Sig Note  .  ALPRAZolam (XANAX) 0.5 MG tablet Take 0.5 mg by mouth daily as needed for anxiety or sleep.    . cyanocobalamin (,VITAMIN B-12,) 1000 MCG/ML injection Inject 1 mL (1,000 mcg total) into the muscle every 30 (thirty) days. 11/29/2016: 3rd of every month  . DEXILANT 60 MG capsule TAKE ONE CAPSULE BY MOUTH DAILY.   . furosemide (LASIX) 40 MG tablet TAKE ONE TABLET BY MOUTH ONCE DAILY.   Marland Kitchen ibuprofen (ADVIL,MOTRIN) 800 MG tablet Take 800 mg by mouth 2 (two) times daily as needed for mild pain or moderate pain. Reported on 08/30/2015   . levothyroxine (SYNTHROID, LEVOTHROID) 88 MCG tablet Take 1 tablet (88 mcg total) by mouth daily.   . metoCLOPramide (REGLAN) 5 MG tablet TAKE (1) TABLET BY MOUTH TWICE DAILY.   . metoprolol succinate (TOPROL XL) 25 MG 24 hr tablet Take 0.5 tablets (12.5 mg total) by mouth daily.   . ondansetron (ZOFRAN) 4 MG tablet Take 1 tablet (4 mg total) by mouth every 8 (eight) hours as needed for nausea or vomiting.   Marland Kitchen oxyCODONE-acetaminophen (PERCOCET/ROXICET) 5-325 MG tablet Take 1-2 tablets by mouth every 4 (four) hours as needed for severe pain.   . potassium chloride (KLOR-CON 10) 10 MEQ tablet Take 10 mEq by mouth every evening. Take one tablet daily as directed   . promethazine (PHENERGAN) 12.5 MG tablet Take 1 tablet (12.5 mg total) by mouth every 6 (six) hours as needed for nausea or vomiting.   . ranitidine (ZANTAC) 150 MG tablet TAKE (1) TABLET BY MOUTH AT BEDTIME.   . riTUXimab (RITUXAN) 100 MG/10ML injection  Inject into the vein. Every 4 months 11/29/2016: Per pt next dose due 12/18/16  . rOPINIRole (REQUIP) 1 MG tablet Take 1 mg by mouth at bedtime.   Marland Kitchen warfarin (COUMADIN) 5 MG tablet TAKE 1 & 1/2 TABLETS BY MOUTH DAILY EXCEPT TAKE 2 TABLETS ON TUESDAYS, THURSDAYS AND SATURDAYS 03/06/2017: Restart evening Thursday 03/07/2017  . zolpidem (AMBIEN) 10 MG tablet Take 10 mg by mouth at bedtime.     No facility-administered encounter medications on file as of 11/26/2017.     ALLERGIES:  Allergies  Allergen Reactions  . Methotrexate Derivatives Other (See Comments)    Raised liver enzymes   . 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 kph     PHYSICAL EXAM:  ECOG Performance status: 1  Vitals:   11/26/17 1117  BP: 115/69  Pulse: 70  Resp: 18  Temp: 98.1 F (36.7 C)   Filed Weights   11/26/17 1117  Weight: 163 lb 3.2 oz (74 kg)    Physical Exam  Constitutional: She is oriented to person, place, and time. She appears well-developed and well-nourished.  Abdominal: Soft.  Musculoskeletal: Normal range of motion.  Neurological: She is alert and oriented to person, place, and time.  Skin: Skin is warm and dry.  Psychiatric: She has a normal mood and affect. Her behavior is normal. Judgment and thought content normal.  Right breast has no palpable masses.  Left breast reconstruction does not show any suspicious areas.  No palpable adenopathy. Abdomen: Soft nontender with no palpable organomegaly.   LABORATORY DATA:  I have reviewed the labs as listed.  CBC    Component Value Date/Time   WBC 8.7 03/18/2017 1056   WBC 8.2 03/16/2017 2337   RBC 3.93 03/18/2017 1056   RBC 3.98 03/16/2017  2337   HGB 11.6 03/18/2017 1056   HCT 35.2 03/18/2017 1056   PLT 252 03/18/2017 1056   MCV 90 03/18/2017 1056   MCH 29.5 03/18/2017 1056   MCH 29.9 03/16/2017 2337   MCHC 33.0 03/18/2017 1056   MCHC  33.0 03/16/2017 2337   RDW 14.3 03/18/2017 1056   LYMPHSABS 1.2 03/16/2017 2337   MONOABS 0.6 03/16/2017 2337   EOSABS 0.2 03/16/2017 2337   BASOSABS 0.0 03/16/2017 2337   CMP Latest Ref Rng & Units 03/16/2017 03/01/2017 11/05/2016  Glucose 65 - 99 mg/dL 107(H) 84 99  BUN 6 - 20 mg/dL _0 Creatinine 0.44 - 1.00 mg/dL 0.77 0.69 0.66  Sodium 135 - 145 mmol/L 137 138 137  Potassium 3.5 - 5.1 mmol/L 3.4(L) 4.0 3.9  Chloride 101 - 111 mmol/L 101 106 98(L)  CO2 22 - 32 mmol/L _1 Calcium 8.9 - 10.3 mg/dL 8.9 9.2 9.4  Total Protein 6.5 - 8.1 g/dL - 7.5 8.0  Total Bilirubin 0.3 - 1.2 mg/dL - 0.6 0.7  Alkaline Phos 38 - 126 U/L - 79 91  AST 15 - 41 U/L - 18 24  ALT 14 - 54 U/L - 15 21       DIAGNOSTIC IMAGING:  I have personally reviewed her mammogram of the right breast and discussed with patient.     ASSESSMENT & PLAN:   Malignant neoplasm of upper outer quadrant of female breast 1.  Triple negative left breast cancer: - Status post neoadjuvant chemotherapy with FEC x2 cycles followed by mastectomy on 04/20/2010, pathology showing 1.6 cm grade 3, margins negative, Ki-67 90%, ER/PR/HER-2 negative - Adjuvant chemotherapy with TC x4 cycles followed by left breast reconstruction and right breast mammoplasty. -Today's physical examination did not reveal any palpable masses or adenopathy.  I have reviewed mammogram of the right breast dated 11/25/2017 which was BI-RADS 1. -I will switch her follow-up visits to once a year.  2.  Hemolytic anemia: -Patient had episode of hemolytic anemia in 2009 secondary to her underlying lupus. - Last hemoglobin in January was 11.6.  We will check a CBC today.  3.  Lupus: - She has joint pains and Raynaud's phenomena. - She is receiving rituximab infusions once every 4 to 6 months for the past 4 years which is controlling it.  4.  Mechanical mitral valve: -She follows up at the Coumadin clinic.      Orders placed this encounter:    Orders Placed This Encounter  Procedures  . CBC with Differential/Platelet  . Comprehensive metabolic panel  . CBC with Differential/Platelet  . Comprehensive metabolic panel      Derek Jack, MD [place] [facility] [phone]

## 2017-11-26 ENCOUNTER — Ambulatory Visit (HOSPITAL_COMMUNITY): Payer: BLUE CROSS/BLUE SHIELD | Admitting: Hematology

## 2017-11-26 ENCOUNTER — Inpatient Hospital Stay (HOSPITAL_COMMUNITY): Payer: BLUE CROSS/BLUE SHIELD

## 2017-11-26 ENCOUNTER — Inpatient Hospital Stay (HOSPITAL_COMMUNITY): Payer: BLUE CROSS/BLUE SHIELD | Attending: Hematology | Admitting: Hematology

## 2017-11-26 ENCOUNTER — Encounter (HOSPITAL_COMMUNITY): Payer: Self-pay | Admitting: Hematology

## 2017-11-26 VITALS — BP 115/69 | HR 70 | Temp 98.1°F | Resp 18 | Wt 163.2 lb

## 2017-11-26 DIAGNOSIS — K219 Gastro-esophageal reflux disease without esophagitis: Secondary | ICD-10-CM | POA: Diagnosis not present

## 2017-11-26 DIAGNOSIS — Z9012 Acquired absence of left breast and nipple: Secondary | ICD-10-CM | POA: Diagnosis not present

## 2017-11-26 DIAGNOSIS — Z87891 Personal history of nicotine dependence: Secondary | ICD-10-CM

## 2017-11-26 DIAGNOSIS — Z79899 Other long term (current) drug therapy: Secondary | ICD-10-CM | POA: Insufficient documentation

## 2017-11-26 DIAGNOSIS — Z952 Presence of prosthetic heart valve: Secondary | ICD-10-CM | POA: Insufficient documentation

## 2017-11-26 DIAGNOSIS — M797 Fibromyalgia: Secondary | ICD-10-CM | POA: Insufficient documentation

## 2017-11-26 DIAGNOSIS — M329 Systemic lupus erythematosus, unspecified: Secondary | ICD-10-CM | POA: Insufficient documentation

## 2017-11-26 DIAGNOSIS — D591 Other autoimmune hemolytic anemias: Secondary | ICD-10-CM | POA: Insufficient documentation

## 2017-11-26 DIAGNOSIS — Z853 Personal history of malignant neoplasm of breast: Secondary | ICD-10-CM | POA: Diagnosis not present

## 2017-11-26 DIAGNOSIS — I7300 Raynaud's syndrome without gangrene: Secondary | ICD-10-CM | POA: Insufficient documentation

## 2017-11-26 DIAGNOSIS — Z9221 Personal history of antineoplastic chemotherapy: Secondary | ICD-10-CM | POA: Diagnosis not present

## 2017-11-26 DIAGNOSIS — C50412 Malignant neoplasm of upper-outer quadrant of left female breast: Secondary | ICD-10-CM

## 2017-11-26 DIAGNOSIS — G629 Polyneuropathy, unspecified: Secondary | ICD-10-CM | POA: Insufficient documentation

## 2017-11-26 DIAGNOSIS — E039 Hypothyroidism, unspecified: Secondary | ICD-10-CM | POA: Insufficient documentation

## 2017-11-26 DIAGNOSIS — Z171 Estrogen receptor negative status [ER-]: Secondary | ICD-10-CM | POA: Diagnosis not present

## 2017-11-26 LAB — COMPREHENSIVE METABOLIC PANEL
ALT: 24 U/L (ref 0–44)
AST: 27 U/L (ref 15–41)
Albumin: 4.4 g/dL (ref 3.5–5.0)
Alkaline Phosphatase: 74 U/L (ref 38–126)
Anion gap: 10 (ref 5–15)
BUN: 18 mg/dL (ref 6–20)
CHLORIDE: 106 mmol/L (ref 98–111)
CO2: 23 mmol/L (ref 22–32)
Calcium: 9.3 mg/dL (ref 8.9–10.3)
Creatinine, Ser: 0.55 mg/dL (ref 0.44–1.00)
Glucose, Bld: 88 mg/dL (ref 70–99)
POTASSIUM: 3.8 mmol/L (ref 3.5–5.1)
Sodium: 139 mmol/L (ref 135–145)
Total Bilirubin: 0.6 mg/dL (ref 0.3–1.2)
Total Protein: 8.2 g/dL — ABNORMAL HIGH (ref 6.5–8.1)

## 2017-11-26 LAB — CBC WITH DIFFERENTIAL/PLATELET
Basophils Absolute: 0.1 10*3/uL (ref 0.0–0.1)
Basophils Relative: 1 %
Eosinophils Absolute: 0.3 10*3/uL (ref 0.0–0.5)
Eosinophils Relative: 4 %
HEMATOCRIT: 40.6 % (ref 36.0–46.0)
Hemoglobin: 13.7 g/dL (ref 12.0–15.0)
LYMPHS PCT: 13 %
Lymphs Abs: 0.8 10*3/uL (ref 0.7–4.0)
MCH: 30.2 pg (ref 26.0–34.0)
MCHC: 33.7 g/dL (ref 30.0–36.0)
MCV: 89.6 fL (ref 80.0–100.0)
Monocytes Absolute: 0.6 10*3/uL (ref 0.1–1.0)
Monocytes Relative: 10 %
NEUTROS ABS: 4.4 10*3/uL (ref 1.7–7.7)
NRBC: 0 % (ref 0.0–0.2)
Neutrophils Relative %: 72 %
Platelets: 213 10*3/uL (ref 150–400)
RBC: 4.53 MIL/uL (ref 3.87–5.11)
RDW: 13.4 % (ref 11.5–15.5)
WBC: 6 10*3/uL (ref 4.0–10.5)

## 2017-11-26 NOTE — Assessment & Plan Note (Addendum)
1.  Triple negative left breast cancer: - Status post neoadjuvant chemotherapy with FEC x2 cycles followed by mastectomy on 04/20/2010, pathology showing 1.6 cm grade 3, margins negative, Ki-67 90%, ER/PR/HER-2 negative - Adjuvant chemotherapy with TC x4 cycles followed by left breast reconstruction and right breast mammoplasty. -Today's physical examination did not reveal any palpable masses or adenopathy.  I have reviewed mammogram of the right breast dated 11/25/2017 which was BI-RADS 1. -I will switch her follow-up visits to once a year.  2.  Hemolytic anemia: -Patient had episode of hemolytic anemia in 2009 secondary to her underlying lupus. - Last hemoglobin in January was 11.6.  We will check a CBC today.  3.  Lupus: - She has joint pains and Raynaud's phenomena. - She is receiving rituximab infusions once every 4 to 6 months for the past 4 years which is controlling it.  4.  Mechanical mitral valve: -She follows up at the Coumadin clinic.

## 2017-11-26 NOTE — Patient Instructions (Addendum)
[place] at [facility]  Discharge Instructions: Follow up in 1 year with labs    _______________________________________________________________  Thank you for choosing [place] at [facility] to provide your oncology and hematology care.  To afford each patient quality time with our providers, please arrive at least 15 minutes before your scheduled appointment.  You need to re-schedule your appointment if you arrive 10 or more minutes late.  We strive to give you quality time with our providers, and arriving late affects you and other patients whose appointments are after yours.  Also, if you no show three or more times for appointments you may be dismissed from the clinic.  Again, thank you for choosing [place] at [facility] hope is that these requests will allow you access to exceptional care and in a timely manner. _______________________________________________________________  If you have questions after your visit, please contact our office at [phone] between the hours of 8:30 a.m. and 5:00 p.m. Voicemails left after 4:30 p.m. will not be returned until the following business day. _______________________________________________________________  For prescription refill requests, have your pharmacy contact our office. _______________________________________________________________  Recommendations made by the consultant and any test results will be sent to your referring physician. _______________________________________________________________

## 2017-11-28 ENCOUNTER — Encounter (HOSPITAL_COMMUNITY): Payer: BLUE CROSS/BLUE SHIELD

## 2017-12-03 ENCOUNTER — Encounter (HOSPITAL_COMMUNITY)
Admission: RE | Admit: 2017-12-03 | Discharge: 2017-12-03 | Disposition: A | Discharge status: Home / Self Care | Payer: BLUE CROSS/BLUE SHIELD | Source: Ambulatory Visit | Attending: Rheumatology | Admitting: Rheumatology

## 2017-12-04 ENCOUNTER — Encounter (HOSPITAL_COMMUNITY): Admission: RE | Admit: 2017-12-04 | Payer: BLUE CROSS/BLUE SHIELD | Source: Ambulatory Visit

## 2017-12-09 ENCOUNTER — Ambulatory Visit (INDEPENDENT_AMBULATORY_CARE_PROVIDER_SITE_OTHER): Payer: BLUE CROSS/BLUE SHIELD | Admitting: *Deleted

## 2017-12-09 DIAGNOSIS — D6859 Other primary thrombophilia: Secondary | ICD-10-CM

## 2017-12-09 DIAGNOSIS — Z5181 Encounter for therapeutic drug level monitoring: Secondary | ICD-10-CM

## 2017-12-09 DIAGNOSIS — Z952 Presence of prosthetic heart valve: Secondary | ICD-10-CM

## 2017-12-09 LAB — POCT INR: INR: 2 (ref 2.0–3.0)

## 2017-12-09 MED ORDER — WARFARIN SODIUM 5 MG PO TABS: ORAL_TABLET | ORAL | 6 refills | Status: DC

## 2017-12-09 NOTE — Patient Instructions (Signed)
Take coumadin 2 1/2 tablets tonight and tomorrow night then resume 1 1/2 tablets daily Recheck in 3 weeks

## 2017-12-09 NOTE — Addendum Note (Signed)
Addended by: Malen Gauze on: 12/09/2017 02:55 PM   Modules accepted: Level of Service

## 2017-12-11 DIAGNOSIS — E559 Vitamin D deficiency, unspecified: Secondary | ICD-10-CM | POA: Diagnosis not present

## 2017-12-11 DIAGNOSIS — Z1389 Encounter for screening for other disorder: Secondary | ICD-10-CM | POA: Diagnosis not present

## 2017-12-11 DIAGNOSIS — R5383 Other fatigue: Secondary | ICD-10-CM | POA: Diagnosis not present

## 2017-12-11 DIAGNOSIS — E538 Deficiency of other specified B group vitamins: Secondary | ICD-10-CM | POA: Diagnosis not present

## 2017-12-11 DIAGNOSIS — D510 Vitamin B12 deficiency anemia due to intrinsic factor deficiency: Secondary | ICD-10-CM | POA: Diagnosis not present

## 2017-12-11 DIAGNOSIS — G894 Chronic pain syndrome: Secondary | ICD-10-CM | POA: Diagnosis not present

## 2017-12-11 DIAGNOSIS — Z6828 Body mass index (BMI) 28.0-28.9, adult: Secondary | ICD-10-CM | POA: Diagnosis not present

## 2017-12-11 DIAGNOSIS — I509 Heart failure, unspecified: Secondary | ICD-10-CM | POA: Diagnosis not present

## 2018-01-06 ENCOUNTER — Ambulatory Visit (INDEPENDENT_AMBULATORY_CARE_PROVIDER_SITE_OTHER): Payer: BLUE CROSS/BLUE SHIELD | Admitting: *Deleted

## 2018-01-06 DIAGNOSIS — Z952 Presence of prosthetic heart valve: Secondary | ICD-10-CM

## 2018-01-06 DIAGNOSIS — D6859 Other primary thrombophilia: Secondary | ICD-10-CM

## 2018-01-06 DIAGNOSIS — Z5181 Encounter for therapeutic drug level monitoring: Secondary | ICD-10-CM | POA: Diagnosis not present

## 2018-01-06 DIAGNOSIS — E039 Hypothyroidism, unspecified: Secondary | ICD-10-CM | POA: Diagnosis not present

## 2018-01-06 LAB — POCT INR: INR: 3.3 — AB (ref 2.0–3.0)

## 2018-01-06 NOTE — Patient Instructions (Signed)
Continue coumadin 1 1/2 tablets daily Recheck in 4 weeks . 

## 2018-01-07 LAB — T4, FREE: FREE T4: 1.3 ng/dL (ref 0.8–1.8)

## 2018-01-07 LAB — TSH: TSH: 1.32 mIU/L

## 2018-01-09 DIAGNOSIS — G894 Chronic pain syndrome: Secondary | ICD-10-CM | POA: Diagnosis not present

## 2018-01-09 DIAGNOSIS — E663 Overweight: Secondary | ICD-10-CM | POA: Diagnosis not present

## 2018-01-09 DIAGNOSIS — Z6827 Body mass index (BMI) 27.0-27.9, adult: Secondary | ICD-10-CM | POA: Diagnosis not present

## 2018-01-09 DIAGNOSIS — Z1389 Encounter for screening for other disorder: Secondary | ICD-10-CM | POA: Diagnosis not present

## 2018-01-09 DIAGNOSIS — D511 Vitamin B12 deficiency anemia due to selective vitamin B12 malabsorption with proteinuria: Secondary | ICD-10-CM | POA: Diagnosis not present

## 2018-01-13 ENCOUNTER — Ambulatory Visit (INDEPENDENT_AMBULATORY_CARE_PROVIDER_SITE_OTHER): Payer: BLUE CROSS/BLUE SHIELD | Admitting: "Endocrinology

## 2018-01-13 ENCOUNTER — Encounter: Payer: Self-pay | Admitting: "Endocrinology

## 2018-01-13 VITALS — BP 116/79 | HR 75 | Ht 64.0 in | Wt 165.0 lb

## 2018-01-13 DIAGNOSIS — E039 Hypothyroidism, unspecified: Secondary | ICD-10-CM

## 2018-01-13 MED ORDER — LEVOTHYROXINE SODIUM 88 MCG PO TABS: 88.0000 ug | ORAL_TABLET | Freq: Every day | ORAL | 3 refills | Status: DC

## 2018-01-13 NOTE — Progress Notes (Signed)
  Endocrinology follow-up note   Follow-up for hypothyroidism   [name] is a 47 y.o.-year-old female, here to f/u for hypothyroidism. She is on levothyroxine 100 mcg p.o. nightly.    She is compliant and taking her levothyroxine properly. She has no new complaints.  Denies heat/cold intolerance.  Denies palpitations, tremors.  - She has a steady weight.   She denies heat intolerance.   ROS: Constitutional: + steady weight loss, no fatigue, no subjective hyperthermia/hypothermia Eyes: no blurry vision, no xerophthalmia ENT: no sore throat, no nodules palpated in throat, no dysphagia/odynophagia, no hoarseness Musculoskeletal: no muscle/joint aches Skin: no rashes Neurological: no tremors/numbness/tingling/dizziness Psychiatric: no depression/anxiety  PE: BP 116/79   Pulse 75   Ht 5\' 4"  (1.626 m)   Wt 165 lb (74.8 kg)   BMI 28.32 kg/m  Wt Readings from Last 3 Encounters:  01/13/18 165 lb (74.8 kg)  11/26/17 163 lb 3.2 oz (74 kg)  11/19/17 163 lb 2.3 oz (74 kg)   Constitutional:  in NAD Eyes: PERRLA, EOMI, no exophthalmos ENT: moist mucous membranes, no thyromegaly,  no cervical lymphadenopathy  Musculoskeletal: no deformities, strength intact in all 4 Skin: moist, warm, use hyperemia of feet and hands bilaterally  (photo dermatitis versus doxycycline skin reaction ) Neurological: no tremor with outstretched hands, DTR normal in all 4   Results for JOMARIE, GELLIS (MRN [id number]) as of 01/13/2018 14:41  Ref. Range 06/20/2017 09:39 01/06/2018 10:47  TSH Latest Units: mIU/L 0.15 (L) 1.32  T4,Free(Direct) Latest Ref Range: 0.8 - 1.8 ng/dL 1.8 1.3    ASSESSMENT: 1. Hypothyroidism  PLAN:   Her thyroid function tests are consistent with slight over replacement. -She is advised to continue levothyroxine to 88 mcg p.o. a.m.    - We discussed about correct intake of levothyroxine, at fasting, with water, separated by at least 30 minutes from breakfast, and  separated by more than 4 hours from calcium, iron, multivitamins, acid reflux medications (PPIs). -Patient is made aware of the fact that thyroid hormone replacement is needed for life, dose to be adjusted by periodic monitoring of thyroid function tests.  Glade Lloyd, MD Phone: [phone]  Fax: [phone]  -  This note was partially dictated with voice recognition software. Similar sounding words can be transcribed inadequately or may not  be corrected upon review.  01/13/2018, 3:47 PM

## 2018-01-20 ENCOUNTER — Other Ambulatory Visit: Payer: Self-pay

## 2018-01-20 DIAGNOSIS — K3184 Gastroparesis: Secondary | ICD-10-CM

## 2018-01-27 ENCOUNTER — Encounter: Payer: Self-pay | Admitting: Cardiology

## 2018-01-27 ENCOUNTER — Ambulatory Visit (INDEPENDENT_AMBULATORY_CARE_PROVIDER_SITE_OTHER): Payer: BLUE CROSS/BLUE SHIELD | Admitting: Cardiology

## 2018-01-27 VITALS — BP 108/78 | HR 68 | Ht 64.0 in | Wt 166.4 lb

## 2018-01-27 DIAGNOSIS — Z952 Presence of prosthetic heart valve: Secondary | ICD-10-CM

## 2018-01-27 DIAGNOSIS — R002 Palpitations: Secondary | ICD-10-CM

## 2018-01-27 NOTE — Progress Notes (Signed)
Cardiology Office Note  Date: 01/27/2018   ID: [name], DOB 11-Jun-1970, MRN [id number]  PCP: Sharilyn Sites, MD  Primary Cardiologist: Rozann Lesches, MD   Chief Complaint  Patient presents with  . Mitral valve disease    History of Present Illness: [name] is a 47 y.o. female last seen in June.  She is here for a routine visit.  She reports no change in stamina, no dizziness or syncope.  She has been feeling fatigued, was started on vitamin D supplements by PCP.  She remains on Coumadin with follow-up in the anticoagulation clinic.  Reports no bleeding episodes.  Follow-up echocardiogram in July revealed LVEF 60 to 65% with stable St. Jude mechanical mitral prosthesis, mean gradient 3 mmHg.  She did have an event recorder in the interim for investigation of brief palpitations, episodes of brief SVT with aberrancy noted.  Low-dose Toprol-XL was initiated.  She states that she has not noticed any difference in palpitations over the last 3 months.  She was wondering whether this might be also contributing to her fatigue.  We discussed stopping the medication for now.  Past Medical History:  Diagnosis Date  . Abnormal Papanicolaou smear of cervix with positive human papilloma virus (HPV) test 08/17/2015  . Abnormality of heart beat   . Anticoagulation goal of INR 2.5 to 3.5   . Anxiety   . B12 deficiency   . BRCA1 negative   . BRCA2 negative   . Breast cancer, left breast ([place]) 2011   S/P mastectomy; chemo; "no radiation due to lupus"  . Bursitis of right knee    Septic bursitis  . Chronic lower back pain   . Drug-induced hepatitis    States per her rheumatologist, transaminases elevated but normalized after drug removed for   . Fibromyalgia   . GERD (gastroesophageal reflux disease)   . 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 History of abnormal cervical Pap smear 08/12/2015  . History of blood transfusion   . HSIL (high grade  squamous intraepithelial lesion) on Pap smear of cervix 01/14/2017   colpo with Dr Elonda Husky   . Hypothyroidism   . Mitral valve disease, rheumatic    St. Jude prosthesis  . Peripheral neuropathy   . Pneumonia   . Raynaud phenomenon   . SLE (systemic lupus erythematosus) ([place])     Past Surgical History:  Procedure Laterality Date  . APPENDECTOMY N/A 05/23/2016   Procedure: OPEN APPENDECTOMY WITH DRAINAGE OF ABSCESS;  Surgeon: Fanny Skates, MD;  Location: [facility];  Service: General;  Laterality: N/A;  . APPENDECTOMY    . BREAST BIOPSY Left 2012  . BREAST IMPLANT EXCHANGE Left 12/2015  . CARDIAC CATHETERIZATION  2008  . CARDIAC VALVE REPLACEMENT    . CERVICAL CONIZATION W/BX N/A 03/06/2017   Procedure: Laser Conization of Cervix;  Surgeon: Florian Buff, MD;  Location: [facility];  Service: Gynecology;  Laterality: N/A;  . ENDOMETRIAL ABLATION  2008   She no longer has menses  . ESOPHAGOGASTRODUODENOSCOPY N/A 03/25/2013   Dr. Raliegh Scarlet reflux esophagitis-likely source of patient's symptoms (patulous EG junction). Hiatal hernia otherwise normal  . LAPAROSCOPIC CHOLECYSTECTOMY    . LIVER BIOPSY  11/28/2016   liver core/notes 11/28/2016  . MASTECTOMY Left 2012  . MASTOPEXY  02/14/2011   Procedure: MASTOPEXY;  Surgeon: Macon Large;  Location: [facility];  Service: Plastics;  Laterality: Bilateral;  Right Breast Reduction   . MITRAL VALVE REPLACEMENT  2008  . TISSUE EXPANDER PLACEMENT  02/14/2011   Procedure: TISSUE EXPANDER;  Surgeon: Macon Large;  Location: [facility];  Service: Plastics;  Laterality: Bilateral;  Left Breast Remove Tissue Expander Placement of Implant Breast Reconstruction  . TISSUE EXPANDER PLACEMENT Left 11/16/2010   breasst  . TUBAL LIGATION      Current Outpatient Medications  Medication Sig Dispense Refill  . ALPRAZolam (XANAX) 0.5 MG tablet Take 0.5 mg by mouth daily as needed for anxiety or sleep.     . cyanocobalamin (,VITAMIN B-12,) 1000 MCG/ML injection Inject 1 mL  (1,000 mcg total) into the muscle every 30 (thirty) days. 10 mL prn  . DEXILANT 60 MG capsule TAKE ONE CAPSULE BY MOUTH DAILY. 90 capsule 3  . furosemide (LASIX) 40 MG tablet TAKE ONE TABLET BY MOUTH ONCE DAILY. 90 tablet 3  . ibuprofen (ADVIL,MOTRIN) 800 MG tablet Take 800 mg by mouth 2 (two) times daily as needed for mild pain or moderate pain. Reported on 08/30/2015    . levothyroxine (SYNTHROID, LEVOTHROID) 88 MCG tablet Take 1 tablet (88 mcg total) by mouth daily. 90 tablet 3  . metoCLOPramide (REGLAN) 5 MG tablet TAKE (1) TABLET BY MOUTH TWICE DAILY. 180 tablet 0  . ondansetron (ZOFRAN) 4 MG tablet Take 1 tablet (4 mg total) by mouth every 8 (eight) hours as needed for nausea or vomiting. 60 tablet 11  . oxyCODONE-acetaminophen (PERCOCET/ROXICET) 5-325 MG tablet Take 1-2 tablets by mouth every 4 (four) hours as needed for severe pain. 30 tablet 0  . potassium chloride (KLOR-CON 10) 10 MEQ tablet Take 10 mEq by mouth every evening. Take one tablet daily as directed    . promethazine (PHENERGAN) 12.5 MG tablet Take 1 tablet (12.5 mg total) by mouth every 6 (six) hours as needed for nausea or vomiting. 60 tablet 11  . ranitidine (ZANTAC) 150 MG tablet TAKE (1) TABLET BY MOUTH AT BEDTIME. 90 tablet 0  . riTUXimab (RITUXAN) 100 MG/10ML injection Inject into the vein. Every 4 months    . rOPINIRole (REQUIP) 1 MG tablet Take 1 mg by mouth at bedtime.    Marland Kitchen warfarin (COUMADIN) 5 MG tablet TAKE 1 & 1/2 TABLETS BY MOUTH DAILY OR AS DIRECTED 60 tablet 6  . zolpidem (AMBIEN) 10 MG tablet Take 10 mg by mouth at bedtime.      No current facility-administered medications for this visit.    Allergies:  Methotrexate derivatives and Other   Social History: The patient  reports that she quit smoking about 11 years ago. Her smoking use included cigarettes. She has a 15.00 pack-year smoking history. She has never used smokeless tobacco. She reports that she drank alcohol. She reports that she does not use drugs.     ROS:  Please see the history of present illness. Otherwise, complete review of systems is positive for none.  All other systems are reviewed and negative.   Physical Exam: VS:  BP 108/78   Pulse 68   Ht 5' 4"  (1.626 m)   Wt 166 lb 6.4 oz (75.5 kg)   BMI 28.56 kg/m , BMI Body mass index is 28.56 kg/m.  Wt Readings from Last 3 Encounters:  01/27/18 166 lb 6.4 oz (75.5 kg)  01/13/18 165 lb (74.8 kg)  11/26/17 163 lb 3.2 oz (74 kg)    General: Patient appears comfortable at rest. HEENT: Conjunctiva and lids normal, oropharynx clear. Neck: Supple, no elevated JVP or carotid bruits, no thyromegaly. Lungs: Clear to auscultation, nonlabored breathing  at rest. Cardiac: Regular rate and rhythm, no S3, prosthetic valve sound in S1, no pericardial rub. Abdomen: Soft, nontender, bowel sounds present. Extremities: No pitting edema, distal pulses 2+. Skin: Warm and dry. Musculoskeletal: No kyphosis. Neuropsychiatric: Alert and oriented x3, affect grossly appropriate.  ECG: I personally reviewed the tracing from 07/23/2017 which shows sinus rhythm with R' in lead V1.  Recent Labwork: 11/26/2017: ALT 24; AST 27; BUN 18; Creatinine, Ser 0.55; Hemoglobin 13.7; Platelets 213; Potassium 3.8; Sodium 139 01/06/2018: TSH 1.32   Other Studies Reviewed Today:  Echocardiogram 08/19/2017: Study Conclusions  - Left ventricle: The cavity size was normal. Wall thickness was   increased in a pattern of mild LVH. Systolic function was normal.   The estimated ejection fraction was in the range of 60% to 65%.   Wall motion was normal; there were no regional wall motion   abnormalities. The study is not technically sufficient to allow   evaluation of LV diastolic function due to the presence of a   mechanical mitral prosthesis. - Mitral valve: Normally functioning mechanical mitral prosthesis   (St. Jude). Mean gradient (D): 3 mm Hg.  Event monitor 08/06/2015: Representative strips from 7-day event  monitor reviewed.  Predominant rhythm is sinus.  Intermittent bursts of wide-complex tachycardia look to be potentially SVT with aberrancy given irregularity in rate, although nonsustained ventricular tachycardia cannot be excluded.  No sustained events or pauses were noted.  Strips reviewed with Dr. Lovena Le for EP opinion as well.  Assessment and Plan:  1.  Rheumatic heart disease status post St. Jude mechanical MVR.  She is on Coumadin and symptomatically stable.  Echocardiogram from July showed normal prosthetic function.  2.  Intermittent palpitations, brief episodes of suspected aberrantly conducted SVT noted by cardiac monitor.  She did not report any significant improvement on beta-blocker, we will stop Toprol-XL for now and observe.  Could always consider a different agent if symptoms escalate.  Current medicines were reviewed with the patient today.  Disposition: Follow-up in 6 months.  Signed, Satira Sark, MD, [facility] 01/27/2018 10:01 AM    [facility] [place]. [address] [place], [place] [address] Phone: [phone]; Fax: [phone]

## 2018-01-27 NOTE — Patient Instructions (Addendum)
Medication Instructions:   Your physician has recommended you make the following change in your medication:   Stop toprol xl 12.5 mg.  Continue all other medications the same.  Labwork:  NONE  Testing/Procedures:  NONE  Follow-Up:  Your physician recommends that you schedule a follow-up appointment in: 6 months. You will receive a reminder letter in the mail in about 4 months reminding you to call and schedule your appointment. If you don't receive this letter, please contact our office.  Any Other Special Instructions Will Be Listed Below (If Applicable).  If you need a refill on your cardiac medications before your next appointment, please call your pharmacy.

## 2018-02-03 ENCOUNTER — Ambulatory Visit (INDEPENDENT_AMBULATORY_CARE_PROVIDER_SITE_OTHER): Payer: BLUE CROSS/BLUE SHIELD | Admitting: *Deleted

## 2018-02-03 DIAGNOSIS — Z5181 Encounter for therapeutic drug level monitoring: Secondary | ICD-10-CM

## 2018-02-03 DIAGNOSIS — D6859 Other primary thrombophilia: Secondary | ICD-10-CM | POA: Diagnosis not present

## 2018-02-03 DIAGNOSIS — Z952 Presence of prosthetic heart valve: Secondary | ICD-10-CM

## 2018-02-03 LAB — POCT INR: INR: 3 (ref 2.0–3.0)

## 2018-02-03 MED ORDER — WARFARIN SODIUM 5 MG PO TABS: ORAL_TABLET | ORAL | 3 refills | Status: DC

## 2018-02-03 NOTE — Patient Instructions (Signed)
Continue coumadin 1 1/2 tablets daily Recheck in 4 weeks . 

## 2018-02-04 DIAGNOSIS — D511 Vitamin B12 deficiency anemia due to selective vitamin B12 malabsorption with proteinuria: Secondary | ICD-10-CM | POA: Diagnosis not present

## 2018-02-04 DIAGNOSIS — E538 Deficiency of other specified B group vitamins: Secondary | ICD-10-CM | POA: Diagnosis not present

## 2018-02-04 DIAGNOSIS — G894 Chronic pain syndrome: Secondary | ICD-10-CM | POA: Diagnosis not present

## 2018-02-07 ENCOUNTER — Other Ambulatory Visit: Payer: Self-pay | Admitting: Gastroenterology

## 2018-03-03 ENCOUNTER — Ambulatory Visit (INDEPENDENT_AMBULATORY_CARE_PROVIDER_SITE_OTHER): Payer: BLUE CROSS/BLUE SHIELD | Admitting: Pharmacist

## 2018-03-03 DIAGNOSIS — Z1211 Encounter for screening for malignant neoplasm of colon: Secondary | ICD-10-CM

## 2018-03-03 DIAGNOSIS — Z5181 Encounter for therapeutic drug level monitoring: Secondary | ICD-10-CM

## 2018-03-03 DIAGNOSIS — Z952 Presence of prosthetic heart valve: Secondary | ICD-10-CM | POA: Diagnosis not present

## 2018-03-03 DIAGNOSIS — D6859 Other primary thrombophilia: Secondary | ICD-10-CM | POA: Diagnosis not present

## 2018-03-03 DIAGNOSIS — Z1212 Encounter for screening for malignant neoplasm of rectum: Secondary | ICD-10-CM

## 2018-03-03 LAB — POCT INR: INR: 2.6 (ref 2.0–3.0)

## 2018-03-03 NOTE — Patient Instructions (Signed)
Description   Continue coumadin 1 1/2 tablets daily  Recheck in 6 weeks     

## 2018-03-05 DIAGNOSIS — E663 Overweight: Secondary | ICD-10-CM | POA: Diagnosis not present

## 2018-03-05 DIAGNOSIS — M0609 Rheumatoid arthritis without rheumatoid factor, multiple sites: Secondary | ICD-10-CM | POA: Diagnosis not present

## 2018-03-05 DIAGNOSIS — Z79899 Other long term (current) drug therapy: Secondary | ICD-10-CM | POA: Diagnosis not present

## 2018-03-05 DIAGNOSIS — Z6828 Body mass index (BMI) 28.0-28.9, adult: Secondary | ICD-10-CM | POA: Diagnosis not present

## 2018-03-05 DIAGNOSIS — M2550 Pain in unspecified joint: Secondary | ICD-10-CM | POA: Diagnosis not present

## 2018-03-05 DIAGNOSIS — M329 Systemic lupus erythematosus, unspecified: Secondary | ICD-10-CM | POA: Diagnosis not present

## 2018-03-06 DIAGNOSIS — M329 Systemic lupus erythematosus, unspecified: Secondary | ICD-10-CM | POA: Diagnosis not present

## 2018-03-06 DIAGNOSIS — R942 Abnormal results of pulmonary function studies: Secondary | ICD-10-CM | POA: Diagnosis not present

## 2018-03-06 DIAGNOSIS — K210 Gastro-esophageal reflux disease with esophagitis: Secondary | ICD-10-CM | POA: Diagnosis not present

## 2018-03-06 DIAGNOSIS — G609 Hereditary and idiopathic neuropathy, unspecified: Secondary | ICD-10-CM | POA: Diagnosis not present

## 2018-03-12 DIAGNOSIS — E663 Overweight: Secondary | ICD-10-CM | POA: Diagnosis not present

## 2018-03-12 DIAGNOSIS — G894 Chronic pain syndrome: Secondary | ICD-10-CM | POA: Diagnosis not present

## 2018-03-12 DIAGNOSIS — Z6829 Body mass index (BMI) 29.0-29.9, adult: Secondary | ICD-10-CM | POA: Diagnosis not present

## 2018-03-12 DIAGNOSIS — E538 Deficiency of other specified B group vitamins: Secondary | ICD-10-CM | POA: Diagnosis not present

## 2018-03-26 ENCOUNTER — Other Ambulatory Visit: Payer: Self-pay | Admitting: Gastroenterology

## 2018-04-09 ENCOUNTER — Encounter (HOSPITAL_COMMUNITY)
Admission: RE | Admit: 2018-04-09 | Discharge: 2018-04-09 | Disposition: A | Discharge status: Home / Self Care | Payer: BLUE CROSS/BLUE SHIELD | Source: Ambulatory Visit | Attending: Rheumatology | Admitting: Rheumatology

## 2018-04-09 DIAGNOSIS — M069 Rheumatoid arthritis, unspecified: Secondary | ICD-10-CM | POA: Insufficient documentation

## 2018-04-09 MED ORDER — ACETAMINOPHEN 325 MG PO TABS
650.0000 mg | ORAL_TABLET | Freq: Once | ORAL | Status: AC
  Administered 2018-04-09: 650 mg via ORAL
  Filled 2018-04-09: qty 2

## 2018-04-09 MED ORDER — SODIUM CHLORIDE 0.9 % IV SOLN
1000.0000 mg | Freq: Once | INTRAVENOUS | Status: AC
  Administered 2018-04-09: 1000 mg via INTRAVENOUS
  Filled 2018-04-09: qty 100

## 2018-04-09 MED ORDER — SODIUM CHLORIDE 0.9 % IV SOLN
Freq: Once | INTRAVENOUS | Status: AC
  Administered 2018-04-09: 10:00:00 via INTRAVENOUS

## 2018-04-09 MED ORDER — METHYLPREDNISOLONE SODIUM SUCC 125 MG IJ SOLR
125.0000 mg | Freq: Once | INTRAMUSCULAR | Status: AC
  Administered 2018-04-09: 125 mg via INTRAVENOUS
  Filled 2018-04-09: qty 2

## 2018-04-09 MED ORDER — DIPHENHYDRAMINE HCL 50 MG/ML IJ SOLN
25.0000 mg | Freq: Once | INTRAMUSCULAR | Status: AC
  Administered 2018-04-09: 25 mg via INTRAVENOUS
  Filled 2018-04-09: qty 1

## 2018-04-09 NOTE — Progress Notes (Signed)
Pt has question about frequency of rituxan infusion. Message left on nurse line at Dr Trudie Reed office for them to return my call. Pt notified.

## 2018-04-09 NOTE — Progress Notes (Signed)
Nurse from [facility] returned call. Rituxan x 2 doses (day 1 then day 15) every 4 months. Verified with Dr Trudie Reed. Appt made. Pt notified. Voiced understanding.

## 2018-04-10 ENCOUNTER — Other Ambulatory Visit (HOSPITAL_COMMUNITY)
Admission: RE | Admit: 2018-04-10 | Discharge: 2018-04-10 | Disposition: A | Discharge status: Home / Self Care | Payer: BLUE CROSS/BLUE SHIELD | Source: Ambulatory Visit | Attending: Obstetrics & Gynecology | Admitting: Obstetrics & Gynecology

## 2018-04-10 ENCOUNTER — Ambulatory Visit (INDEPENDENT_AMBULATORY_CARE_PROVIDER_SITE_OTHER): Payer: BLUE CROSS/BLUE SHIELD | Admitting: Obstetrics & Gynecology

## 2018-04-10 ENCOUNTER — Encounter: Payer: Self-pay | Admitting: Obstetrics & Gynecology

## 2018-04-10 VITALS — BP 112/78 | HR 80 | Ht 64.0 in | Wt 165.0 lb

## 2018-04-10 DIAGNOSIS — Z9889 Other specified postprocedural states: Secondary | ICD-10-CM

## 2018-04-10 DIAGNOSIS — Z8742 Personal history of other diseases of the female genital tract: Secondary | ICD-10-CM

## 2018-04-10 NOTE — Progress Notes (Signed)
Chief Complaint  Patient presents with  . repeat PAP smear      48 y.o. [id number] No LMP recorded. Patient has had an ablation. The current method of family planning is tubal ligation.  Outpatient Encounter Medications as of 04/10/2018  Medication Sig Note  . ALPRAZolam (XANAX) 0.5 MG tablet Take 0.5 mg by mouth daily as needed for anxiety or sleep.    . cyanocobalamin (,VITAMIN B-12,) 1000 MCG/ML injection Inject 1 mL (1,000 mcg total) into the muscle every 30 (thirty) days. 11/29/2016: 3rd of every month  . DEXILANT 60 MG capsule TAKE ONE CAPSULE BY MOUTH DAILY.   . furosemide (LASIX) 40 MG tablet TAKE ONE TABLET BY MOUTH ONCE DAILY.   Marland Kitchen ibuprofen (ADVIL,MOTRIN) 800 MG tablet Take 800 mg by mouth 2 (two) times daily as needed for mild pain or moderate pain. Reported on 08/30/2015   . levothyroxine (SYNTHROID, LEVOTHROID) 88 MCG tablet Take 1 tablet (88 mcg total) by mouth daily.   . metoCLOPramide (REGLAN) 5 MG tablet TAKE (1) TABLET BY MOUTH TWICE DAILY.   Marland Kitchen ondansetron (ZOFRAN) 4 MG tablet Take 1 tablet (4 mg total) by mouth every 8 (eight) hours as needed for nausea or vomiting.   Marland Kitchen oxyCODONE-acetaminophen (PERCOCET/ROXICET) 5-325 MG tablet Take 1-2 tablets by mouth every 4 (four) hours as needed for severe pain.   . potassium chloride (KLOR-CON 10) 10 MEQ tablet Take 10 mEq by mouth every evening. Take one tablet daily as directed   . promethazine (PHENERGAN) 12.5 MG tablet Take 1 tablet (12.5 mg total) by mouth every 6 (six) hours as needed for nausea or vomiting.   . ranitidine (ZANTAC) 150 MG tablet TAKE (1) TABLET BY MOUTH AT BEDTIME.   . riTUXimab (RITUXAN) 100 MG/10ML injection Inject into the vein. Every 4 months 11/29/2016: Per pt next dose due 12/18/16  . rOPINIRole (REQUIP) 1 MG tablet Take 1 mg by mouth at bedtime.   Marland Kitchen warfarin (COUMADIN) 5 MG tablet TAKE 1 & 1/2 TABLETS BY MOUTH DAILY OR AS DIRECTED   . zolpidem (AMBIEN) 10 MG tablet Take 10 mg by mouth at bedtime.      No facility-administered encounter medications on file as of 04/10/2018.     Subjective Pt is s/p laser conization of the cervix for HSIL with negative margins Here for follow up Pap  No complaints Past Medical History:  Diagnosis Date  . Abnormal Papanicolaou smear of cervix with positive human papilloma virus (HPV) test 08/17/2015  . Abnormality of heart beat   . Anticoagulation goal of INR 2.5 to 3.5   . Anxiety   . B12 deficiency   . BRCA1 negative   . BRCA2 negative   . Breast cancer, left breast ([place]) 2011   S/P mastectomy; chemo; "no radiation due to lupus"  . Bursitis of right knee    Septic bursitis  . Chronic lower back pain   . Drug-induced hepatitis    States per her rheumatologist, transaminases elevated but normalized after drug removed for   . Fibromyalgia   . GERD (gastroesophageal reflux disease)   . 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 History of abnormal cervical Pap smear 08/12/2015  . History of blood transfusion   . HSIL (high grade squamous intraepithelial lesion) on Pap smear of cervix 01/14/2017   colpo with Dr Elonda Husky   . Hypothyroidism   . Mitral valve disease, rheumatic    St. Jude prosthesis  . Peripheral neuropathy   .  Pneumonia   . Raynaud phenomenon   . SLE (systemic lupus erythematosus) ([place])     Past Surgical History:  Procedure Laterality Date  . APPENDECTOMY N/A 05/23/2016   Procedure: OPEN APPENDECTOMY WITH DRAINAGE OF ABSCESS;  Surgeon: Fanny Skates, MD;  Location: [facility];  Service: General;  Laterality: N/A;  . APPENDECTOMY    . BREAST BIOPSY Left 2012  . BREAST IMPLANT EXCHANGE Left 12/2015  . CARDIAC CATHETERIZATION  2008  . CARDIAC VALVE REPLACEMENT    . CERVICAL CONIZATION W/BX N/A 03/06/2017   Procedure: Laser Conization of Cervix;  Surgeon: Florian Buff, MD;  Location: [facility];  Service: Gynecology;  Laterality: N/A;  . ENDOMETRIAL ABLATION  2008   She no longer has menses  .  ESOPHAGOGASTRODUODENOSCOPY N/A 03/25/2013   Dr. Raliegh Scarlet reflux esophagitis-likely source of patient's symptoms (patulous EG junction). Hiatal hernia otherwise normal  . LAPAROSCOPIC CHOLECYSTECTOMY    . LIVER BIOPSY  11/28/2016   liver core/notes 11/28/2016  . MASTECTOMY Left 2012  . MASTOPEXY  02/14/2011   Procedure: MASTOPEXY;  Surgeon: Macon Large;  Location: [facility];  Service: Plastics;  Laterality: Bilateral;  Right Breast Reduction   . MITRAL VALVE REPLACEMENT  2008  . TISSUE EXPANDER PLACEMENT  02/14/2011   Procedure: TISSUE EXPANDER;  Surgeon: Macon Large;  Location: [facility];  Service: Plastics;  Laterality: Bilateral;  Left Breast Remove Tissue Expander Placement of Implant Breast Reconstruction  . TISSUE EXPANDER PLACEMENT Left 11/16/2010   breasst  . TUBAL LIGATION      OB History    Gravida  2   Para  2   Term  0   Preterm      AB      Living  2     SAB      TAB      Ectopic      Multiple      Live Births  2           Allergies  Allergen Reactions  . Methotrexate Derivatives Other (See Comments)    Raised liver enzymes   . 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 kph    Social History   Socioeconomic History  . Marital status: Married    Spouse name: Not on file  . Number of children: 2  . Years of education: Not on file  . Highest education level: Not on file  Occupational History  . Not on file  Social Needs  . Financial resource strain: Not on file  . Food insecurity:    Worry: Not on file    Inability: Not on file  . Transportation needs:    Medical: Not on file    Non-medical: Not on file  Tobacco Use  . Smoking status: Former Smoker    Packs/day: 1.00    Years: 15.00    Pack years: 15.00    Types: Cigarettes    Last attempt to quit: 02/19/2006    Years since quitting: 12.1  . Smokeless tobacco: Never Used  Substance and  Sexual Activity  . Alcohol use: Not Currently    Alcohol/week: 0.0 standard drinks    Comment: 11/28/2016 "couple drinks/yearZ"  . Drug use: No  . Sexual activity: Not Currently    Birth control/protection: Surgical    Comment: tubal and ablation  Lifestyle  . Physical activity:  Days per week: Not on file    Minutes per session: Not on file  . Stress: Not on file  Relationships  . Social connections:    Talks on phone: Not on file    Gets together: Not on file    Attends religious service: Not on file    Active member of club or organization: Not on file    Attends meetings of clubs or organizations: Not on file    Relationship status: Not on file  Other Topics Concern  . Not on file  Social History Narrative  . Not on file    Family History  Problem Relation Age of Onset  . Hypertension Mother   . Hyperlipidemia Mother   . Hypertension Father   . Colon cancer Neg Hx     Medications:       Current Outpatient Medications:  .  ALPRAZolam (XANAX) 0.5 MG tablet, Take 0.5 mg by mouth daily as needed for anxiety or sleep. , Disp: , Rfl:  .  cyanocobalamin (,VITAMIN B-12,) 1000 MCG/ML injection, Inject 1 mL (1,000 mcg total) into the muscle every 30 (thirty) days., Disp: 10 mL, Rfl: prn .  DEXILANT 60 MG capsule, TAKE ONE CAPSULE BY MOUTH DAILY., Disp: 90 capsule, Rfl: 3 .  furosemide (LASIX) 40 MG tablet, TAKE ONE TABLET BY MOUTH ONCE DAILY., Disp: 90 tablet, Rfl: 3 .  ibuprofen (ADVIL,MOTRIN) 800 MG tablet, Take 800 mg by mouth 2 (two) times daily as needed for mild pain or moderate pain. Reported on 08/30/2015, Disp: , Rfl:  .  levothyroxine (SYNTHROID, LEVOTHROID) 88 MCG tablet, Take 1 tablet (88 mcg total) by mouth daily., Disp: 90 tablet, Rfl: 3 .  metoCLOPramide (REGLAN) 5 MG tablet, TAKE (1) TABLET BY MOUTH TWICE DAILY., Disp: 180 tablet, Rfl: 3 .  ondansetron (ZOFRAN) 4 MG tablet, Take 1 tablet (4 mg total) by mouth every 8 (eight) hours as needed for nausea or  vomiting., Disp: 60 tablet, Rfl: 11 .  oxyCODONE-acetaminophen (PERCOCET/ROXICET) 5-325 MG tablet, Take 1-2 tablets by mouth every 4 (four) hours as needed for severe pain., Disp: 30 tablet, Rfl: 0 .  potassium chloride (KLOR-CON 10) 10 MEQ tablet, Take 10 mEq by mouth every evening. Take one tablet daily as directed, Disp: , Rfl:  .  promethazine (PHENERGAN) 12.5 MG tablet, Take 1 tablet (12.5 mg total) by mouth every 6 (six) hours as needed for nausea or vomiting., Disp: 60 tablet, Rfl: 11 .  ranitidine (ZANTAC) 150 MG tablet, TAKE (1) TABLET BY MOUTH AT BEDTIME., Disp: 90 tablet, Rfl: 1 .  riTUXimab (RITUXAN) 100 MG/10ML injection, Inject into the vein. Every 4 months, Disp: , Rfl:  .  rOPINIRole (REQUIP) 1 MG tablet, Take 1 mg by mouth at bedtime., Disp: , Rfl:  .  warfarin (COUMADIN) 5 MG tablet, TAKE 1 & 1/2 TABLETS BY MOUTH DAILY OR AS DIRECTED, Disp: 135 tablet, Rfl: 3 .  zolpidem (AMBIEN) 10 MG tablet, Take 10 mg by mouth at bedtime. , Disp: , Rfl:   Objective Blood pressure 112/78, pulse 80, height 5' 4"  (1.626 m), weight 165 lb (74.8 kg).  General WDWN female NAD Vulva:  normal appearing vulva with no masses, tenderness or lesions Vagina:  normal mucosa, no discharge Cervix:  no bleeding following Pap and no cervical motion tenderness Uterus:   Adnexa: ovaries:,     Pertinent ROS No burning with urination, frequency or urgency No nausea, vomiting or diarrhea Nor fever chills or other constitutional symptoms  Labs or studies Pending Pap    Impression Diagnoses this Encounter::   ICD-10-CM   1. S/P cone biopsy of cervix Z98.890   2. History of abnormal cervical Pap smear Z87.42 Cytology - PAP    Established relevant diagnosis(es):   Plan/Recommendations: No orders of the defined types were placed in this encounter.   Labs or Scans Ordered: No orders of the defined types were placed in this encounter.   Management:: >repeat Pap in 6 months  Follow  up Return in about 6 months (around 10/09/2018) for Follow up Pap , with Dr Elonda Husky.      All questions were answered.

## 2018-04-14 ENCOUNTER — Ambulatory Visit (INDEPENDENT_AMBULATORY_CARE_PROVIDER_SITE_OTHER): Payer: BLUE CROSS/BLUE SHIELD | Admitting: *Deleted

## 2018-04-14 DIAGNOSIS — D6859 Other primary thrombophilia: Secondary | ICD-10-CM

## 2018-04-14 DIAGNOSIS — Z952 Presence of prosthetic heart valve: Secondary | ICD-10-CM

## 2018-04-14 DIAGNOSIS — Z1212 Encounter for screening for malignant neoplasm of rectum: Secondary | ICD-10-CM

## 2018-04-14 DIAGNOSIS — Z5181 Encounter for therapeutic drug level monitoring: Secondary | ICD-10-CM

## 2018-04-14 DIAGNOSIS — Z1211 Encounter for screening for malignant neoplasm of colon: Secondary | ICD-10-CM

## 2018-04-14 LAB — POCT INR: INR: 3.5 — AB (ref 2.0–3.0)

## 2018-04-14 NOTE — Patient Instructions (Signed)
Continue coumadin 1 1/2 tablets daily Recheck in 3 weeks Scheduled for Rutuxin 3/5

## 2018-04-15 LAB — CYTOLOGY - PAP
Diagnosis: UNDETERMINED — AB
HPV: DETECTED — AB

## 2018-04-16 ENCOUNTER — Telehealth: Payer: Self-pay | Admitting: *Deleted

## 2018-04-16 NOTE — Telephone Encounter (Signed)
DOB verified. Informed pt that PAP smear came back abnormal with +HPV. Advised to schedule an appt for colposcopy. Pt verbalized understanding.

## 2018-04-17 ENCOUNTER — Other Ambulatory Visit: Payer: Self-pay | Admitting: Obstetrics & Gynecology

## 2018-04-17 MED ORDER — FLUCONAZOLE 150 MG PO TABS: 150.0000 mg | ORAL_TABLET | Freq: Once | ORAL | 0 refills | Status: AC

## 2018-04-21 DIAGNOSIS — Z1389 Encounter for screening for other disorder: Secondary | ICD-10-CM | POA: Diagnosis not present

## 2018-04-21 DIAGNOSIS — E538 Deficiency of other specified B group vitamins: Secondary | ICD-10-CM | POA: Diagnosis not present

## 2018-04-21 DIAGNOSIS — E663 Overweight: Secondary | ICD-10-CM | POA: Diagnosis not present

## 2018-04-21 DIAGNOSIS — Z6828 Body mass index (BMI) 28.0-28.9, adult: Secondary | ICD-10-CM | POA: Diagnosis not present

## 2018-04-21 DIAGNOSIS — J069 Acute upper respiratory infection, unspecified: Secondary | ICD-10-CM | POA: Diagnosis not present

## 2018-04-21 DIAGNOSIS — G894 Chronic pain syndrome: Secondary | ICD-10-CM | POA: Diagnosis not present

## 2018-04-24 ENCOUNTER — Encounter (HOSPITAL_COMMUNITY)
Admission: RE | Admit: 2018-04-24 | Discharge: 2018-04-24 | Disposition: A | Discharge status: Home / Self Care | Payer: BLUE CROSS/BLUE SHIELD | Source: Ambulatory Visit | Attending: Rheumatology | Admitting: Rheumatology

## 2018-04-24 ENCOUNTER — Encounter (HOSPITAL_COMMUNITY): Payer: Self-pay

## 2018-04-24 DIAGNOSIS — M069 Rheumatoid arthritis, unspecified: Secondary | ICD-10-CM | POA: Diagnosis not present

## 2018-04-24 HISTORY — DX: Rheumatoid arthritis, unspecified: M06.9

## 2018-04-24 MED ORDER — DIPHENHYDRAMINE HCL 50 MG/ML IJ SOLN
25.0000 mg | Freq: Once | INTRAMUSCULAR | Status: AC
  Administered 2018-04-24: 25 mg via INTRAVENOUS
  Filled 2018-04-24: qty 1

## 2018-04-24 MED ORDER — SODIUM CHLORIDE 0.9 % IV SOLN
Freq: Once | INTRAVENOUS | Status: AC
  Administered 2018-04-24: 09:00:00 via INTRAVENOUS

## 2018-04-24 MED ORDER — ACETAMINOPHEN 325 MG PO TABS
650.0000 mg | ORAL_TABLET | Freq: Once | ORAL | Status: AC
  Administered 2018-04-24: 650 mg via ORAL
  Filled 2018-04-24: qty 2

## 2018-04-24 MED ORDER — METHYLPREDNISOLONE SODIUM SUCC 125 MG IJ SOLR
125.0000 mg | Freq: Once | INTRAMUSCULAR | Status: AC
  Administered 2018-04-24: 125 mg via INTRAVENOUS
  Filled 2018-04-24: qty 2

## 2018-04-24 MED ORDER — SODIUM CHLORIDE 0.9 % IV SOLN
1000.0000 mg | Freq: Once | INTRAVENOUS | Status: AC
  Administered 2018-04-24: 1000 mg via INTRAVENOUS
  Filled 2018-04-24: qty 100

## 2018-05-01 ENCOUNTER — Encounter: Payer: Self-pay | Admitting: Obstetrics & Gynecology

## 2018-05-05 ENCOUNTER — Other Ambulatory Visit: Payer: Self-pay

## 2018-05-05 ENCOUNTER — Ambulatory Visit (INDEPENDENT_AMBULATORY_CARE_PROVIDER_SITE_OTHER): Payer: BLUE CROSS/BLUE SHIELD | Admitting: *Deleted

## 2018-05-05 DIAGNOSIS — Z5181 Encounter for therapeutic drug level monitoring: Secondary | ICD-10-CM

## 2018-05-05 DIAGNOSIS — D6859 Other primary thrombophilia: Secondary | ICD-10-CM

## 2018-05-05 DIAGNOSIS — Z952 Presence of prosthetic heart valve: Secondary | ICD-10-CM | POA: Diagnosis not present

## 2018-05-05 DIAGNOSIS — Z1211 Encounter for screening for malignant neoplasm of colon: Secondary | ICD-10-CM

## 2018-05-05 DIAGNOSIS — Z1212 Encounter for screening for malignant neoplasm of rectum: Secondary | ICD-10-CM

## 2018-05-05 LAB — POCT INR: INR: 6.8 — AB (ref 2.0–3.0)

## 2018-05-05 NOTE — Patient Instructions (Signed)
Hold coumadin tonight and tomorrow night and recheck on Wednesday Had Rutuxin 3/5 Had keflex and steroid injection end of Feb for bronchitis

## 2018-05-07 ENCOUNTER — Telehealth: Payer: Self-pay | Admitting: *Deleted

## 2018-05-07 ENCOUNTER — Other Ambulatory Visit: Payer: Self-pay

## 2018-05-07 ENCOUNTER — Ambulatory Visit (INDEPENDENT_AMBULATORY_CARE_PROVIDER_SITE_OTHER): Payer: BLUE CROSS/BLUE SHIELD | Admitting: *Deleted

## 2018-05-07 DIAGNOSIS — Z5181 Encounter for therapeutic drug level monitoring: Secondary | ICD-10-CM | POA: Diagnosis not present

## 2018-05-07 DIAGNOSIS — Z952 Presence of prosthetic heart valve: Secondary | ICD-10-CM

## 2018-05-07 DIAGNOSIS — D6859 Other primary thrombophilia: Secondary | ICD-10-CM

## 2018-05-07 LAB — POCT INR: INR: 5.7 — AB (ref 2.0–3.0)

## 2018-05-07 NOTE — Telephone Encounter (Signed)
Told pt she needs to keep appt today.  She agreed to come.

## 2018-05-07 NOTE — Telephone Encounter (Signed)
Patient wants to know if she still needs to come in today

## 2018-05-07 NOTE — Patient Instructions (Signed)
Hold coumadin tonight, take 1/2 tablet tomorrow night then resume 1 1/2 tablets daily  Recheck in 2 weeks Had Rutuxin 3/5 Had keflex and steroid injection end of Feb for bronchitis

## 2018-05-09 ENCOUNTER — Encounter (HOSPITAL_COMMUNITY): Payer: BLUE CROSS/BLUE SHIELD

## 2018-05-09 ENCOUNTER — Other Ambulatory Visit: Payer: Self-pay | Admitting: Nurse Practitioner

## 2018-05-14 DIAGNOSIS — G894 Chronic pain syndrome: Secondary | ICD-10-CM | POA: Diagnosis not present

## 2018-05-20 ENCOUNTER — Telehealth: Payer: Self-pay | Admitting: *Deleted

## 2018-05-20 NOTE — Telephone Encounter (Signed)
COVID-19 Pre-Screening Questions:  . Do you currently have a fever?NO (yes = cancel and refer to pcp for e-visit) . Have you recently travelled on a cruise, internationally, or to [place], [place], [place], [place], [place], or [place], [place] ([employer]) ? NO (yes = cancel, stay home, monitor symptoms, and contact pcp or initiate e-visit if symptoms develop) . Have you been in contact with someone that is currently pending confirmation of Covid19 testing or has been confirmed to have the Covid19 virus?  NO (yes = cancel, stay home, away from tested individual, monitor symptoms, and contact pcp or initiate e-visit if symptoms develop) . Are you currently experiencing fatigue or cough? NO (yes = pt should be prepared to have a mask placed at the time of their visit).      

## 2018-05-21 ENCOUNTER — Other Ambulatory Visit: Payer: Self-pay

## 2018-05-21 ENCOUNTER — Ambulatory Visit (INDEPENDENT_AMBULATORY_CARE_PROVIDER_SITE_OTHER): Payer: BLUE CROSS/BLUE SHIELD | Admitting: *Deleted

## 2018-05-21 DIAGNOSIS — D6859 Other primary thrombophilia: Secondary | ICD-10-CM

## 2018-05-21 DIAGNOSIS — Z5181 Encounter for therapeutic drug level monitoring: Secondary | ICD-10-CM

## 2018-05-21 DIAGNOSIS — Z952 Presence of prosthetic heart valve: Secondary | ICD-10-CM | POA: Diagnosis not present

## 2018-05-21 LAB — POCT INR: INR: 4.1 — AB (ref 2.0–3.0)

## 2018-05-21 NOTE — Patient Instructions (Signed)
Hold coumadin tonight then decrease dose to 1 1/2 tablets daily except 1 tablet on Mondays  Recheck in 3 weeks Had Rutuxin 3/5 Had keflex and steroid injection end of Feb for bronchitis

## 2018-06-09 ENCOUNTER — Other Ambulatory Visit: Payer: Self-pay

## 2018-06-09 ENCOUNTER — Ambulatory Visit (INDEPENDENT_AMBULATORY_CARE_PROVIDER_SITE_OTHER): Payer: BLUE CROSS/BLUE SHIELD | Admitting: *Deleted

## 2018-06-09 DIAGNOSIS — Z5181 Encounter for therapeutic drug level monitoring: Secondary | ICD-10-CM

## 2018-06-09 DIAGNOSIS — D6859 Other primary thrombophilia: Secondary | ICD-10-CM | POA: Diagnosis not present

## 2018-06-09 DIAGNOSIS — Z952 Presence of prosthetic heart valve: Secondary | ICD-10-CM

## 2018-06-09 LAB — POCT INR: INR: 4.6 — AB (ref 2.0–3.0)

## 2018-06-09 NOTE — Patient Instructions (Signed)
Hold coumadin tonight then resume 1 1/2 tablets daily except 1 tablet on Mondays  Recheck in 3 weeks

## 2018-06-13 DIAGNOSIS — G894 Chronic pain syndrome: Secondary | ICD-10-CM | POA: Diagnosis not present

## 2018-07-06 ENCOUNTER — Encounter (HOSPITAL_COMMUNITY): Payer: Self-pay | Admitting: Emergency Medicine

## 2018-07-06 ENCOUNTER — Emergency Department (HOSPITAL_COMMUNITY): Payer: BLUE CROSS/BLUE SHIELD

## 2018-07-06 ENCOUNTER — Other Ambulatory Visit: Payer: Self-pay

## 2018-07-06 ENCOUNTER — Emergency Department (HOSPITAL_COMMUNITY)
Admission: EM | Admit: 2018-07-06 | Discharge: 2018-07-07 | Disposition: A | Discharge status: Home / Self Care | Payer: BLUE CROSS/BLUE SHIELD | Attending: Emergency Medicine | Admitting: Emergency Medicine

## 2018-07-06 DIAGNOSIS — Y998 Other external cause status: Secondary | ICD-10-CM | POA: Insufficient documentation

## 2018-07-06 DIAGNOSIS — Z79899 Other long term (current) drug therapy: Secondary | ICD-10-CM | POA: Diagnosis not present

## 2018-07-06 DIAGNOSIS — Y9389 Activity, other specified: Secondary | ICD-10-CM | POA: Diagnosis not present

## 2018-07-06 DIAGNOSIS — Z87891 Personal history of nicotine dependence: Secondary | ICD-10-CM | POA: Insufficient documentation

## 2018-07-06 DIAGNOSIS — S62653A Nondisplaced fracture of medial phalanx of left middle finger, initial encounter for closed fracture: Secondary | ICD-10-CM | POA: Diagnosis not present

## 2018-07-06 DIAGNOSIS — Z853 Personal history of malignant neoplasm of breast: Secondary | ICD-10-CM | POA: Diagnosis not present

## 2018-07-06 DIAGNOSIS — Y929 Unspecified place or not applicable: Secondary | ICD-10-CM | POA: Diagnosis not present

## 2018-07-06 DIAGNOSIS — Z7901 Long term (current) use of anticoagulants: Secondary | ICD-10-CM | POA: Diagnosis not present

## 2018-07-06 DIAGNOSIS — E039 Hypothyroidism, unspecified: Secondary | ICD-10-CM | POA: Insufficient documentation

## 2018-07-06 DIAGNOSIS — W19XXXA Unspecified fall, initial encounter: Secondary | ICD-10-CM | POA: Diagnosis not present

## 2018-07-06 DIAGNOSIS — Z952 Presence of prosthetic heart valve: Secondary | ICD-10-CM | POA: Insufficient documentation

## 2018-07-06 DIAGNOSIS — S62623A Displaced fracture of medial phalanx of left middle finger, initial encounter for closed fracture: Secondary | ICD-10-CM | POA: Diagnosis not present

## 2018-07-06 DIAGNOSIS — S6992XA Unspecified injury of left wrist, hand and finger(s), initial encounter: Secondary | ICD-10-CM | POA: Diagnosis not present

## 2018-07-06 NOTE — Discharge Instructions (Addendum)
Schedule to see the Hand surgeon for evaluation.  Ice and elevate

## 2018-07-06 NOTE — ED Provider Notes (Signed)
[facility] EMERGENCY DEPARTMENT Provider Note   CSN: [id number] Arrival date & time: 07/06/18  2227    History   Chief Complaint Chief Complaint  Patient presents with  . Finger Injury    HPI [name] is a 48 y.o. female.     The history is provided by the patient. No language interpreter was used.  Hand Pain  This is a new problem. The current episode started yesterday. The problem occurs constantly. The problem has not changed since onset.Nothing aggravates the symptoms. Nothing relieves the symptoms. She has tried nothing for the symptoms.  Pt reports she fell yesterday and hit her left middle finger.  Pt reports end of finger will not straighten and she thinks finger is broken   Past Medical History:  Diagnosis Date  . Abnormal Papanicolaou smear of cervix with positive human papilloma virus (HPV) test 08/17/2015  . Abnormality of heart beat   . Anticoagulation goal of INR 2.5 to 3.5   . Anxiety   . B12 deficiency   . BRCA1 negative   . BRCA2 negative   . Breast cancer, left breast ([place]) 2011   S/P mastectomy; chemo; "no radiation due to lupus"  . Bursitis of right knee    Septic bursitis  . Chronic lower back pain   . Drug-induced hepatitis    States per her rheumatologist, transaminases elevated but normalized after drug removed for   . Fibromyalgia   . GERD (gastroesophageal reflux disease)   . 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 History of abnormal cervical Pap smear 08/12/2015  . History of blood transfusion   . HSIL (high grade squamous intraepithelial lesion) on Pap smear of cervix 01/14/2017   colpo with Dr Elonda Husky   . Hypothyroidism   . Mitral valve disease, rheumatic    St. Jude prosthesis  . Peripheral neuropathy   . Pneumonia   . Raynaud phenomenon   . Rheumatoid arthritis ([place])   . SLE (systemic lupus erythematosus) ([place])     Patient Active Problem List   Diagnosis Date Noted  . HSIL (high grade  squamous intraepithelial lesion) on Pap smear of cervix 01/14/2017  . Encounter for gynecological examination with Papanicolaou smear of cervix 01/08/2017  . At risk for hemorrhage associated with liver biopsy 11/28/2016  . Elevated LFTs 10/04/2016  . Appendicitis with peritoneal abscess 05/23/2016  . Sepsis, unspecified organism ([place]) 05/22/2016  . AKI (acute kidney injury) ([place]) 05/22/2016  . Abdominal pain 05/21/2016  . Appendicitis with abscess 05/21/2016  . Volume depletion 05/21/2016  . History of breast cancer 05/21/2016  . Vaginal discharge 11/14/2015  . Vaginal burning 11/14/2015  . BV (bacterial vaginosis) 11/14/2015  . Abnormal Papanicolaou smear of cervix with positive human papilloma virus (HPV) test 08/17/2015  . History of abnormal cervical Pap smear 08/12/2015  . Hypothyroidism 12/07/2014  . Primary hypercoagulable state ([place]) 12/14/2013  . Encounter for monitoring cardiotoxic drug therapy 11/23/2013  . Gastroparesis 08/13/2013  . Screening for colorectal cancer 03/18/2013  . Dyspepsia 03/12/2013  . Dyspnea 11/14/2012  . Anticoagulation goal of INR 2.5 to 3.5   . Peripheral neuropathy   . B12 deficiency 08/21/2010  . 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 BRCA1 negative 08/16/2010  . BRCA2 negative 08/16/2010  . Long term current use of anticoagulant therapy 05/17/2010  . Malignant neoplasm of upper outer quadrant of female breast ([place]) 02/24/2010  . History of mitral valve replacement with mechanical valve  11/10/2009  . Systemic lupus erythematosus ([place]) 11/10/2009    Past Surgical History:  Procedure Laterality Date  . APPENDECTOMY N/A 05/23/2016   Procedure: OPEN APPENDECTOMY WITH DRAINAGE OF ABSCESS;  Surgeon: Fanny Skates, MD;  Location: [place];  Service: General;  Laterality: N/A;  . APPENDECTOMY    . BREAST BIOPSY Left 2012  . BREAST IMPLANT EXCHANGE Left 12/2015  . CARDIAC CATHETERIZATION  2008  . CARDIAC  VALVE REPLACEMENT    . CERVICAL CONIZATION W/BX N/A 03/06/2017   Procedure: Laser Conization of Cervix;  Surgeon: Florian Buff, MD;  Location: [facility];  Service: Gynecology;  Laterality: N/A;  . ENDOMETRIAL ABLATION  2008   She no longer has menses  . ESOPHAGOGASTRODUODENOSCOPY N/A 03/25/2013   Dr. Raliegh Scarlet reflux esophagitis-likely source of patient's symptoms (patulous EG junction). Hiatal hernia otherwise normal  . LAPAROSCOPIC CHOLECYSTECTOMY    . LIVER BIOPSY  11/28/2016   liver core/notes 11/28/2016  . MASTECTOMY Left 2012  . MASTOPEXY  02/14/2011   Procedure: MASTOPEXY;  Surgeon: Macon Large;  Location: [place];  Service: Plastics;  Laterality: Bilateral;  Right Breast Reduction   . MITRAL VALVE REPLACEMENT  2008  . TISSUE EXPANDER PLACEMENT  02/14/2011   Procedure: TISSUE EXPANDER;  Surgeon: Macon Large;  Location: [place];  Service: Plastics;  Laterality: Bilateral;  Left Breast Remove Tissue Expander Placement of Implant Breast Reconstruction  . TISSUE EXPANDER PLACEMENT Left 11/16/2010   breasst  . TUBAL LIGATION       OB History    Gravida  2   Para  2   Term  0   Preterm      AB      Living  2     SAB      TAB      Ectopic      Multiple      Live Births  2            Home Medications    Prior to Admission medications   Medication Sig Start Date End Date Taking? Authorizing Provider  ALPRAZolam Duanne Moron) 0.5 MG tablet Take 0.5 mg by mouth daily as needed for anxiety or sleep.  11/02/13   [provider]  cyanocobalamin (,VITAMIN B-12,) 1000 MCG/ML injection Inject 1 mL (1,000 mcg total) into the muscle every 30 (thirty) days. 12/19/10   Everardo All, MD  DEXILANT 60 MG capsule TAKE ONE CAPSULE BY MOUTH DAILY. 08/16/17   Mahala Menghini, PA-C  furosemide (LASIX) 40 MG tablet TAKE ONE TABLET BY MOUTH ONCE DAILY. 07/17/17   Satira Sark, MD  ibuprofen (ADVIL,MOTRIN) 800 MG tablet Take 800 mg by mouth 2 (two) times daily as needed  for mild pain or moderate pain. Reported on 08/30/2015    [provider]  levothyroxine (SYNTHROID, LEVOTHROID) 88 MCG tablet Take 1 tablet (88 mcg total) by mouth daily. 01/13/18   Cassandria Anger, MD  metoCLOPramide (REGLAN) 5 MG tablet TAKE (1) TABLET BY MOUTH TWICE DAILY. 03/26/18   Annitta Needs, NP  ondansetron (ZOFRAN) 4 MG tablet Take 1 tablet (4 mg total) by mouth every 8 (eight) hours as needed for nausea or vomiting. 10/09/17   Annitta Needs, NP  oxyCODONE-acetaminophen (PERCOCET/ROXICET) 5-325 MG tablet Take 1-2 tablets by mouth every 4 (four) hours as needed for severe pain. 03/06/17   Florian Buff, MD  potassium chloride (KLOR-CON 10) 10 MEQ tablet Take 10 mEq by mouth every evening. Take one tablet daily  as directed 11/09/13   [provider]  promethazine (PHENERGAN) 12.5 MG tablet Take 1 tablet (12.5 mg total) by mouth every 6 (six) hours as needed for nausea or vomiting. 10/09/17   Annitta Needs, NP  ranitidine (ZANTAC) 150 MG tablet TAKE (1) TABLET BY MOUTH AT BEDTIME. 05/12/18   Annitta Needs, NP  riTUXimab (RITUXAN) 100 MG/10ML injection Inject into the vein. Every 4 months    [provider]  rOPINIRole (REQUIP) 1 MG tablet Take 1 mg by mouth at bedtime.    [provider]  warfarin (COUMADIN) 5 MG tablet TAKE 1 & 1/2 TABLETS BY MOUTH DAILY OR AS DIRECTED 02/03/18   Satira Sark, MD  zolpidem (AMBIEN) 10 MG tablet Take 10 mg by mouth at bedtime.  12/19/10   de [place] Scotland, MD    Family History Family History  Problem Relation Age of Onset  . Hypertension Mother   . Hyperlipidemia Mother   . Hypertension Father   . Colon cancer Neg Hx     Social History Social History   Tobacco Use  . Smoking status: Former Smoker    Packs/day: 1.00    Years: 15.00    Pack years: 15.00    Types: Cigarettes    Last attempt to quit: 02/19/2006    Years since quitting: 12.3  . Smokeless tobacco: Never Used  Substance Use Topics  . Alcohol  use: Not Currently    Alcohol/week: 0.0 standard drinks    Comment: 11/28/2016 "couple drinks/yearZ"  . Drug use: No     Allergies   Methotrexate derivatives and Other   Review of Systems Review of Systems  Musculoskeletal: Positive for joint swelling and myalgias.  All other systems reviewed and are negative.    Physical Exam Updated Vital Signs BP 134/82 (BP Location: Right Arm)   Pulse 79   Temp 97.9 F (36.6 C) (Oral)   Resp 17   Ht _0  (1.626 m)   Wt 72.6 kg   SpO2 100%   BMI 27.46 kg/m   Physical Exam Vitals signs reviewed.  Musculoskeletal:        General: Swelling, tenderness and deformity present.     Comments: Swollen tender 3rd finger,  Decreased range of motion distal tip,    Skin:    General: Skin is warm.  Neurological:     General: No focal deficit present.     Mental Status: She is alert.  Psychiatric:        Mood and Affect: Mood normal.      ED Treatments / Results  Labs (all labs ordered are listed, but only abnormal results are displayed) Labs Reviewed - No data to display  EKG None  Radiology No results found.  Procedures Procedures (including critical care time)  Medications Ordered in ED Medications - No data to display   Initial Impression / Assessment and Plan / ED Course  I have reviewed the triage vital signs and the nursing notes.  Pertinent labs & imaging results that were available during my care of the patient were reviewed by me and considered in my medical decision making (see chart for details).        MDM  Xrays reviewed and discussed with pt.  Fracture middle phalanx left 3rd finger,  nv and ns intact   Final Clinical Impressions(s) / ED Diagnoses   Final diagnoses:  Closed nondisplaced fracture of middle phalanx of left middle finger, initial encounter    ED  Discharge Orders    None    An After Visit Summary was printed and given to the patient.    Sidney Ace 07/06/18 2328     Ezequiel Essex, MD 07/07/18 [phone]

## 2018-07-06 NOTE — ED Triage Notes (Signed)
Pt reports she fell yesterday, tried to catch herself thinks she broke her finger, swelling and bruising noted

## 2018-07-07 ENCOUNTER — Ambulatory Visit (INDEPENDENT_AMBULATORY_CARE_PROVIDER_SITE_OTHER): Payer: BLUE CROSS/BLUE SHIELD | Admitting: *Deleted

## 2018-07-07 DIAGNOSIS — Z952 Presence of prosthetic heart valve: Secondary | ICD-10-CM

## 2018-07-07 DIAGNOSIS — Z5181 Encounter for therapeutic drug level monitoring: Secondary | ICD-10-CM | POA: Diagnosis not present

## 2018-07-07 DIAGNOSIS — D6859 Other primary thrombophilia: Secondary | ICD-10-CM

## 2018-07-07 LAB — POCT INR: INR: 4 — AB (ref 2.0–3.0)

## 2018-07-07 NOTE — Patient Instructions (Signed)
Hold coumadin tonight then decrease dose to 1 1/2 tablets daily except 1 tablet on Mondays and Thursdays  Recheck in 3 weeks

## 2018-07-09 DIAGNOSIS — S62623A Displaced fracture of medial phalanx of left middle finger, initial encounter for closed fracture: Secondary | ICD-10-CM | POA: Diagnosis not present

## 2018-07-09 DIAGNOSIS — M13839 Other specified arthritis, unspecified wrist: Secondary | ICD-10-CM | POA: Diagnosis not present

## 2018-07-09 DIAGNOSIS — M79645 Pain in left finger(s): Secondary | ICD-10-CM | POA: Diagnosis not present

## 2018-07-10 ENCOUNTER — Telehealth: Payer: Self-pay | Admitting: *Deleted

## 2018-07-10 NOTE — Telephone Encounter (Signed)
Please give pt a call @[phone]

## 2018-07-10 NOTE — Telephone Encounter (Signed)
Spoke with pt.  She saw Dr Amedeo Plenty to look at broken finger.  He needs to do surgery to place 2 pins.  He feels comfortable doing procedure on coumadin per pt.

## 2018-07-11 DIAGNOSIS — Y999 Unspecified external cause status: Secondary | ICD-10-CM | POA: Diagnosis not present

## 2018-07-11 DIAGNOSIS — X58XXXA Exposure to other specified factors, initial encounter: Secondary | ICD-10-CM | POA: Diagnosis not present

## 2018-07-11 DIAGNOSIS — S62623A Displaced fracture of medial phalanx of left middle finger, initial encounter for closed fracture: Secondary | ICD-10-CM | POA: Diagnosis not present

## 2018-07-16 DIAGNOSIS — G894 Chronic pain syndrome: Secondary | ICD-10-CM | POA: Diagnosis not present

## 2018-07-17 DIAGNOSIS — Z681 Body mass index (BMI) 19 or less, adult: Secondary | ICD-10-CM | POA: Diagnosis not present

## 2018-07-17 DIAGNOSIS — Z1389 Encounter for screening for other disorder: Secondary | ICD-10-CM | POA: Diagnosis not present

## 2018-07-17 DIAGNOSIS — Z0001 Encounter for general adult medical examination with abnormal findings: Secondary | ICD-10-CM | POA: Diagnosis not present

## 2018-07-24 DIAGNOSIS — M879 Osteonecrosis, unspecified: Secondary | ICD-10-CM | POA: Diagnosis not present

## 2018-07-24 DIAGNOSIS — M79645 Pain in left finger(s): Secondary | ICD-10-CM | POA: Diagnosis not present

## 2018-07-24 DIAGNOSIS — Z4789 Encounter for other orthopedic aftercare: Secondary | ICD-10-CM | POA: Diagnosis not present

## 2018-07-24 DIAGNOSIS — S62623D Displaced fracture of medial phalanx of left middle finger, subsequent encounter for fracture with routine healing: Secondary | ICD-10-CM | POA: Diagnosis not present

## 2018-07-31 DIAGNOSIS — M79645 Pain in left finger(s): Secondary | ICD-10-CM | POA: Diagnosis not present

## 2018-08-01 ENCOUNTER — Telehealth: Payer: Self-pay | Admitting: *Deleted

## 2018-08-01 NOTE — Telephone Encounter (Signed)

## 2018-08-04 DIAGNOSIS — M79645 Pain in left finger(s): Secondary | ICD-10-CM | POA: Diagnosis not present

## 2018-08-04 DIAGNOSIS — Z4789 Encounter for other orthopedic aftercare: Secondary | ICD-10-CM | POA: Diagnosis not present

## 2018-08-04 DIAGNOSIS — S62623D Displaced fracture of medial phalanx of left middle finger, subsequent encounter for fracture with routine healing: Secondary | ICD-10-CM | POA: Diagnosis not present

## 2018-08-05 ENCOUNTER — Ambulatory Visit (INDEPENDENT_AMBULATORY_CARE_PROVIDER_SITE_OTHER): Payer: BC Managed Care – PPO | Admitting: *Deleted

## 2018-08-05 DIAGNOSIS — D6859 Other primary thrombophilia: Secondary | ICD-10-CM

## 2018-08-05 DIAGNOSIS — Z5181 Encounter for therapeutic drug level monitoring: Secondary | ICD-10-CM | POA: Diagnosis not present

## 2018-08-05 DIAGNOSIS — Z952 Presence of prosthetic heart valve: Secondary | ICD-10-CM | POA: Diagnosis not present

## 2018-08-05 LAB — POCT INR: INR: 7.2 — AB (ref 2.0–3.0)

## 2018-08-05 NOTE — Patient Instructions (Signed)
Hold coumadin tonight and tomorrow night and recheck INR on Thursday Has been taking 1 1/2 tablets daily except 1 tablet on Mondays and Thursdays  Recheck on Thursday Bleeding and fall precautions discussed with pt and she verbalized understanding.

## 2018-08-07 ENCOUNTER — Ambulatory Visit (INDEPENDENT_AMBULATORY_CARE_PROVIDER_SITE_OTHER): Payer: BC Managed Care – PPO | Admitting: *Deleted

## 2018-08-07 DIAGNOSIS — D6859 Other primary thrombophilia: Secondary | ICD-10-CM | POA: Diagnosis not present

## 2018-08-07 DIAGNOSIS — Z5181 Encounter for therapeutic drug level monitoring: Secondary | ICD-10-CM | POA: Diagnosis not present

## 2018-08-07 DIAGNOSIS — Z952 Presence of prosthetic heart valve: Secondary | ICD-10-CM | POA: Diagnosis not present

## 2018-08-07 LAB — POCT INR: INR: 2.2 (ref 2.0–3.0)

## 2018-08-07 NOTE — Patient Instructions (Signed)
Restart coumadin 1 1/2 tablets daily except 1 tablet on Mondays and Thursdays  Recheck on 08/20/18

## 2018-08-08 ENCOUNTER — Encounter (HOSPITAL_COMMUNITY): Payer: BLUE CROSS/BLUE SHIELD

## 2018-08-12 ENCOUNTER — Telehealth: Payer: Self-pay

## 2018-08-12 MED ORDER — FAMOTIDINE 40 MG PO TABS: 40.0000 mg | ORAL_TABLET | Freq: Every day | ORAL | 3 refills | Status: DC

## 2018-08-12 NOTE — Telephone Encounter (Signed)
Noted  

## 2018-08-12 NOTE — Telephone Encounter (Signed)
Received a refill request from [name]. Ranitidine is on recall, requesting another medication for pt.

## 2018-08-12 NOTE — Telephone Encounter (Signed)
Sent in Pepcid to take each evening prn .

## 2018-08-12 NOTE — Addendum Note (Signed)
Addended by: Annitta Needs on: 08/12/2018 04:12 PM   Modules accepted: Orders

## 2018-08-14 ENCOUNTER — Encounter (HOSPITAL_COMMUNITY)
Admission: RE | Admit: 2018-08-14 | Discharge: 2018-08-14 | Disposition: A | Discharge status: Home / Self Care | Payer: BC Managed Care – PPO | Source: Ambulatory Visit | Attending: Rheumatology | Admitting: Rheumatology

## 2018-08-15 DIAGNOSIS — Z681 Body mass index (BMI) 19 or less, adult: Secondary | ICD-10-CM | POA: Diagnosis not present

## 2018-08-15 DIAGNOSIS — G894 Chronic pain syndrome: Secondary | ICD-10-CM | POA: Diagnosis not present

## 2018-08-18 DIAGNOSIS — S62623D Displaced fracture of medial phalanx of left middle finger, subsequent encounter for fracture with routine healing: Secondary | ICD-10-CM | POA: Diagnosis not present

## 2018-08-18 DIAGNOSIS — M13839 Other specified arthritis, unspecified wrist: Secondary | ICD-10-CM | POA: Diagnosis not present

## 2018-08-18 DIAGNOSIS — M79645 Pain in left finger(s): Secondary | ICD-10-CM | POA: Diagnosis not present

## 2018-08-18 DIAGNOSIS — Z4789 Encounter for other orthopedic aftercare: Secondary | ICD-10-CM | POA: Diagnosis not present

## 2018-08-20 ENCOUNTER — Other Ambulatory Visit: Payer: Self-pay

## 2018-08-20 ENCOUNTER — Ambulatory Visit (INDEPENDENT_AMBULATORY_CARE_PROVIDER_SITE_OTHER): Payer: BC Managed Care – PPO | Admitting: *Deleted

## 2018-08-20 DIAGNOSIS — D6859 Other primary thrombophilia: Secondary | ICD-10-CM

## 2018-08-20 DIAGNOSIS — Z5181 Encounter for therapeutic drug level monitoring: Secondary | ICD-10-CM | POA: Diagnosis not present

## 2018-08-20 DIAGNOSIS — Z952 Presence of prosthetic heart valve: Secondary | ICD-10-CM

## 2018-08-20 LAB — POCT INR: INR: 4 — AB (ref 2.0–3.0)

## 2018-08-20 NOTE — Patient Instructions (Signed)
Hold coumadin tonight then resume 1 1/2 tablets daily except 1 tablet on Mondays and Thursdays  Recheck on 09/11/18

## 2018-08-28 ENCOUNTER — Encounter (HOSPITAL_COMMUNITY): Admission: RE | Admit: 2018-08-28 | Payer: BC Managed Care – PPO | Source: Ambulatory Visit

## 2018-09-03 DIAGNOSIS — M329 Systemic lupus erythematosus, unspecified: Secondary | ICD-10-CM | POA: Diagnosis not present

## 2018-09-03 DIAGNOSIS — M2550 Pain in unspecified joint: Secondary | ICD-10-CM | POA: Diagnosis not present

## 2018-09-03 DIAGNOSIS — M0609 Rheumatoid arthritis without rheumatoid factor, multiple sites: Secondary | ICD-10-CM | POA: Diagnosis not present

## 2018-09-03 DIAGNOSIS — Z79899 Other long term (current) drug therapy: Secondary | ICD-10-CM | POA: Diagnosis not present

## 2018-09-09 ENCOUNTER — Encounter: Payer: Self-pay | Admitting: Internal Medicine

## 2018-09-11 ENCOUNTER — Other Ambulatory Visit: Payer: Self-pay

## 2018-09-11 ENCOUNTER — Ambulatory Visit (INDEPENDENT_AMBULATORY_CARE_PROVIDER_SITE_OTHER): Payer: BC Managed Care – PPO | Admitting: *Deleted

## 2018-09-11 DIAGNOSIS — Z952 Presence of prosthetic heart valve: Secondary | ICD-10-CM

## 2018-09-11 DIAGNOSIS — Z5181 Encounter for therapeutic drug level monitoring: Secondary | ICD-10-CM

## 2018-09-11 DIAGNOSIS — D6859 Other primary thrombophilia: Secondary | ICD-10-CM | POA: Diagnosis not present

## 2018-09-11 LAB — POCT INR: INR: 3.2 — AB (ref 2.0–3.0)

## 2018-09-11 NOTE — Patient Instructions (Signed)
Continue coumadin 1 1/2 tablets daily except 1 tablet on Mondays and Thursdays  Recheck on 10/14/18

## 2018-09-18 DIAGNOSIS — G894 Chronic pain syndrome: Secondary | ICD-10-CM | POA: Diagnosis not present

## 2018-10-01 ENCOUNTER — Ambulatory Visit (INDEPENDENT_AMBULATORY_CARE_PROVIDER_SITE_OTHER): Payer: BC Managed Care – PPO | Admitting: Gastroenterology

## 2018-10-01 ENCOUNTER — Encounter: Payer: Self-pay | Admitting: Gastroenterology

## 2018-10-01 ENCOUNTER — Telehealth: Payer: Self-pay | Admitting: Cardiology

## 2018-10-01 ENCOUNTER — Other Ambulatory Visit: Payer: Self-pay

## 2018-10-01 VITALS — BP 105/73 | HR 72 | Temp 96.6°F | Ht 64.0 in | Wt 159.6 lb

## 2018-10-01 DIAGNOSIS — K3184 Gastroparesis: Secondary | ICD-10-CM | POA: Diagnosis not present

## 2018-10-01 NOTE — Assessment & Plan Note (Signed)
48 year old female doing well with Dexilant daily, Reglan low dose BID, Zofran and phenergan sparingly. No alarm signs/symptoms. Without adverse side effects. Discussed reducing Reglan if possible. She is due for initial screening colonoscopy this year but would like to postpone till next year at time of visit. On chronic Coumadin and will need Lovenox bridging. No concerning lower or upper GI signs/symptoms. Return in 1 year or sooner.

## 2018-10-01 NOTE — Patient Instructions (Signed)
It was great to see you today! I am glad you are doing well.  We will see you in 1 year and arrange a colonoscopy then. Please call if any issues in the meantime!  Annitta Needs, PhD, ANP-BC [facility]

## 2018-10-01 NOTE — Progress Notes (Signed)
Referring Provider: Sharilyn Sites, MD Primary Care Physician:  Sharilyn Sites, MD Primary GI: Dr. Gala Romney   Chief Complaint  Patient presents with  . gastroparesis    doing ok    HPI:   [name] is a 48 y.o. female presenting today with a history of erosive esophagitis and gastroparesis. Has done well on low dose Reglan BID historically, PPI. Zofran now covered by insurance and keeps on hand if needed. Unable to take erythromycin as she is on chronic Coumadin.Was on methotrexate for about a year then taken off due to elevated LFTs.LFTs have now normalized on multiple occasions. Here for routine one year follow-up.   Dexilant for GERD, Zofran and phenergan prn. Zofran has helped better historically. Needs initial screening colonoscopy. No vomiting. No abdominal pain.   Not taking Zofran often. Pepcid each evening. Reglan BID. If no postprandial stool, will have bloating. Most of time soft or loose. Intermittently. NO rectal bleeding. Would like to hold off on screening colonoscopy for now until next year.    Past Medical History:  Diagnosis Date  . Abnormal Papanicolaou smear of cervix with positive human papilloma virus (HPV) test 08/17/2015  . Abnormality of heart beat   . Anticoagulation goal of INR 2.5 to 3.5   . Anxiety   . B12 deficiency   . BRCA1 negative   . BRCA2 negative   . Breast cancer, left breast ([place]) 2011   S/P mastectomy; chemo; "no radiation due to lupus"  . Bursitis of right knee    Septic bursitis  . Chronic lower back pain   . Drug-induced hepatitis    States per her rheumatologist, transaminases elevated but normalized after drug removed for   . Fibromyalgia   . GERD (gastroesophageal reflux disease)   . 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 History of abnormal cervical Pap smear 08/12/2015  . History of blood transfusion   . HSIL (high grade squamous intraepithelial lesion) on Pap smear of cervix 01/14/2017   colpo with Dr Elonda Husky   . Hypothyroidism   . Mitral valve disease, rheumatic    St. Jude prosthesis  . Peripheral neuropathy   . Pneumonia   . Raynaud phenomenon   . Rheumatoid arthritis ([place])   . SLE (systemic lupus erythematosus) ([place])     Past Surgical History:  Procedure Laterality Date  . APPENDECTOMY N/A 05/23/2016   Procedure: OPEN APPENDECTOMY WITH DRAINAGE OF ABSCESS;  Surgeon: Fanny Skates, MD;  Location: [place];  Service: General;  Laterality: N/A;  . APPENDECTOMY    . BREAST BIOPSY Left 2012  . BREAST IMPLANT EXCHANGE Left 12/2015  . CARDIAC CATHETERIZATION  2008  . CARDIAC VALVE REPLACEMENT    . CERVICAL CONIZATION W/BX N/A 03/06/2017   Procedure: Laser Conization of Cervix;  Surgeon: Florian Buff, MD;  Location: [facility];  Service: Gynecology;  Laterality: N/A;  . ENDOMETRIAL ABLATION  2008   She no longer has menses  . ESOPHAGOGASTRODUODENOSCOPY N/A 03/25/2013   Dr. Raliegh Scarlet reflux esophagitis-likely source of patient's symptoms (patulous EG junction). Hiatal hernia otherwise normal  . LAPAROSCOPIC CHOLECYSTECTOMY    . LIVER BIOPSY  11/28/2016   liver core/notes 11/28/2016  . MASTECTOMY Left 2012  . MASTOPEXY  02/14/2011   Procedure: MASTOPEXY;  Surgeon: Macon Large;  Location: [place];  Service: Plastics;  Laterality: Bilateral;  Right Breast Reduction   . MITRAL VALVE REPLACEMENT  2008  . TISSUE EXPANDER PLACEMENT  02/14/2011   Procedure:  TISSUE EXPANDER;  Surgeon: Macon Large;  Location: [place];  Service: Plastics;  Laterality: Bilateral;  Left Breast Remove Tissue Expander Placement of Implant Breast Reconstruction  . TISSUE EXPANDER PLACEMENT Left 11/16/2010   breasst  . TUBAL LIGATION      Current Outpatient Medications  Medication Sig Dispense Refill  . cyanocobalamin (,VITAMIN B-12,) 1000 MCG/ML injection Inject 1 mL (1,000 mcg total) into the muscle every 30 (thirty) days. 10 mL prn  . DEXILANT 60 MG capsule TAKE ONE CAPSULE BY MOUTH DAILY. 90  capsule 3  . famotidine (PEPCID) 40 MG tablet Take 1 tablet (40 mg total) by mouth at bedtime. As needed 90 tablet 3  . furosemide (LASIX) 40 MG tablet TAKE ONE TABLET BY MOUTH ONCE DAILY. 90 tablet 3  . ibuprofen (ADVIL,MOTRIN) 800 MG tablet Take 800 mg by mouth 2 (two) times daily as needed for mild pain or moderate pain. Reported on 08/30/2015    . levothyroxine (SYNTHROID, LEVOTHROID) 88 MCG tablet Take 1 tablet (88 mcg total) by mouth daily. 90 tablet 3  . metoCLOPramide (REGLAN) 5 MG tablet TAKE (1) TABLET BY MOUTH TWICE DAILY. 180 tablet 3  . ondansetron (ZOFRAN) 4 MG tablet Take 1 tablet (4 mg total) by mouth every 8 (eight) hours as needed for nausea or vomiting. 60 tablet 11  . oxyCODONE-acetaminophen (PERCOCET/ROXICET) 5-325 MG tablet Take 1-2 tablets by mouth every 4 (four) hours as needed for severe pain. 30 tablet 0  . potassium chloride (KLOR-CON 10) 10 MEQ tablet Take 10 mEq by mouth every evening. Take one tablet daily as directed    . promethazine (PHENERGAN) 12.5 MG tablet Take 1 tablet (12.5 mg total) by mouth every 6 (six) hours as needed for nausea or vomiting. 60 tablet 11  . riTUXimab (RITUXAN) 100 MG/10ML injection Inject into the vein. Every 4 months    . rOPINIRole (REQUIP) 1 MG tablet Take 1 mg by mouth at bedtime.    Marland Kitchen warfarin (COUMADIN) 5 MG tablet TAKE 1 & 1/2 TABLETS BY MOUTH DAILY OR AS DIRECTED 135 tablet 3  . zolpidem (AMBIEN) 10 MG tablet Take 10 mg by mouth at bedtime.      No current facility-administered medications for this visit.     Allergies as of 10/01/2018 - Review Complete 10/01/2018  Allergen Reaction Noted  . Methotrexate derivatives Other (See Comments) 11/29/2016  . Other  05/25/2016    Family History  Problem Relation Age of Onset  . Hypertension Mother   . Hyperlipidemia Mother   . Hypertension Father   . Colon cancer Neg Hx   . Colon polyps Neg Hx     Social History   Socioeconomic History  . Marital status: Married    Spouse  name: Not on file  . Number of children: 2  . Years of education: Not on file  . Highest education level: Not on file  Occupational History  . Not on file  Social Needs  . Financial resource strain: Not on file  . Food insecurity    Worry: Not on file    Inability: Not on file  . Transportation needs    Medical: Not on file    Non-medical: Not on file  Tobacco Use  . Smoking status: Former Smoker    Packs/day: 1.00    Years: 15.00    Pack years: 15.00    Types: Cigarettes    Quit date: 02/19/2006    Years since quitting: 12.6  . Smokeless tobacco: Never  Used  Substance and Sexual Activity  . Alcohol use: Not Currently    Alcohol/week: 0.0 standard drinks    Comment: 11/28/2016 "couple drinks/yearZ"  . Drug use: No  . Sexual activity: Not Currently    Birth control/protection: Surgical    Comment: tubal and ablation  Lifestyle  . Physical activity    Days per week: Not on file    Minutes per session: Not on file  . Stress: Not on file  Relationships  . Social Herbalist on phone: Not on file    Gets together: Not on file    Attends religious service: Not on file    Active member of club or organization: Not on file    Attends meetings of clubs or organizations: Not on file    Relationship status: Not on file  Other Topics Concern  . Not on file  Social History Narrative  . Not on file    Review of Systems: Gen: Denies fever, chills, anorexia. Denies fatigue, weakness, weight loss.  CV: Denies chest pain, palpitations, syncope, peripheral edema, and claudication. Resp: Denies dyspnea at rest, cough, wheezing, coughing up blood, and pleurisy. GI: see HPI  Derm: Denies rash, itching, dry skin Psych: Denies depression, anxiety, memory loss, confusion. No homicidal or suicidal ideation.  Heme: Denies bruising, bleeding, and enlarged lymph nodes.  Physical Exam: BP 105/73   Pulse 72   Temp (!) 96.6 F (35.9 C) (Temporal)   Ht 5' 4"  (1.626 m)   Wt 159  lb 9.6 oz (72.4 kg)   BMI 27.40 kg/m  General:   Alert and oriented. No distress noted. Pleasant and cooperative.  Head:  Normocephalic and atraumatic. Eyes:  Conjuctiva clear without scleral icterus. Mouth:  Oral mucosa pink and moist.  Cardiac: S1 S2 present, prosthetic valve sound Lungs: Clear bilaterally Abdomen:  +BS, soft, non-tender and non-distended. No rebound or guarding. No HSM or masses noted. Msk:  Symmetrical without gross deformities. Normal posture. Extremities:  Without edema. Neurologic:  Alert and  oriented x4 Psych:  Alert and cooperative. Normal mood and affect.

## 2018-10-01 NOTE — Telephone Encounter (Signed)
Virtual Visit Pre-Appointment Phone Call  "(Name), I am calling you today to discuss your upcoming appointment. We are currently trying to limit exposure to the virus that causes COVID-19 by seeing patients at home rather than in the office."  1. "What is the BEST phone number to call the day of the visit?" - include this in appointment notes  2. Do you have or have access to (through a family member/friend) a smartphone with video capability that we can use for your visit?" a. If yes - list this number in appt notes as cell (if different from BEST phone #) and list the appointment type as a VIDEO visit in appointment notes b. If no - list the appointment type as a PHONE visit in appointment notes  3. Confirm consent - "In the setting of the current Covid19 crisis, you are scheduled for a (phone or video) visit with your provider on (date) at (time).  Just as we do with many in-office visits, in order for you to participate in this visit, we must obtain consent.  If you'd like, I can send this to your mychart (if signed up) or email for you to review.  Otherwise, I can obtain your verbal consent now.  All virtual visits are billed to your insurance company just like a normal visit would be.  By agreeing to a virtual visit, we'd like you to understand that the technology does not allow for your provider to perform an examination, and thus may limit your provider's ability to fully assess your condition. If your provider identifies any concerns that need to be evaluated in person, we will make arrangements to do so.  Finally, though the technology is pretty good, we cannot assure that it will always work on either your or our end, and in the setting of a video visit, we may have to convert it to a phone-only visit.  In either situation, we cannot ensure that we have a secure connection.  Are you willing to proceed?" STAFF: Did the patient verbally acknowledge consent to telehealth visit? Document  YES/NO here: yes  4. Advise patient to be prepared - "Two hours prior to your appointment, go ahead and check your blood pressure, pulse, oxygen saturation, and your weight (if you have the equipment to check those) and write them all down. When your visit starts, your provider will ask you for this information. If you have an Apple Watch or Kardia device, please plan to have heart rate information ready on the day of your appointment. Please have a pen and paper handy nearby the day of the visit as well."  5. Give patient instructions for MyChart download to smartphone OR Doximity/Doxy.me as below if video visit (depending on what platform provider is using)  6. Inform patient they will receive a phone call 15 minutes prior to their appointment time (may be from unknown caller ID) so they should be prepared to answer    TELEPHONE CALL NOTE  [name] has been deemed a candidate for a follow-up tele-health visit to limit community exposure during the Covid-19 pandemic. I spoke with the patient via phone to ensure availability of phone/video source, confirm preferred email & phone number, and discuss instructions and expectations.  I reminded [name] to be prepared with any vital sign and/or heart rhythm information that could potentially be obtained via home monitoring, at the time of her visit. I reminded [name] to expect a phone call prior to  her visit.  Weston Anna 10/01/2018 9:40 AM   INSTRUCTIONS FOR DOWNLOADING THE MYCHART APP TO SMARTPHONE  - The patient must first make sure to have activated MyChart and know their login information - If Apple, go to [employer] and type in MyChart in the search bar and download the app. If Android, ask patient to go to [employer] and type in [facility] in the search bar and download the app. The app is free but as with any other app downloads, their phone may require them to verify saved payment information or Apple/Android  password.  - The patient will need to then log into the app with their MyChart username and password, and select [facility] as their healthcare provider to link the account. When it is time for your visit, go to the MyChart app, find appointments, and click Begin Video Visit. Be sure to Select Allow for your device to access the Microphone and Camera for your visit. You will then be connected, and your provider will be with you shortly.  **If they have any issues connecting, or need assistance please contact MyChart service desk [phone])**  **If using a computer, in order to ensure the best quality for their visit they will need to use either of the following Internet Browsers: [employer], or Google Chrome**  IF USING DOXIMITY or DOXY.ME - The patient will receive a link just prior to their visit by text.     FULL LENGTH CONSENT FOR TELE-HEALTH VISIT   I hereby voluntarily request, consent and authorize [facility] and its employed or contracted physicians, physician assistants, nurse practitioners or other licensed health care professionals (the Practitioner), to provide me with telemedicine health care services (the Services") as deemed necessary by the treating Practitioner. I acknowledge and consent to receive the Services by the Practitioner via telemedicine. I understand that the telemedicine visit will involve communicating with the Practitioner through live audiovisual communication technology and the disclosure of certain medical information by electronic transmission. I acknowledge that I have been given the opportunity to request an in-person assessment or other available alternative prior to the telemedicine visit and am voluntarily participating in the telemedicine visit.  I understand that I have the right to withhold or withdraw my consent to the use of telemedicine in the course of my care at any time, without affecting my right to future care or treatment,  and that the Practitioner or I may terminate the telemedicine visit at any time. I understand that I have the right to inspect all information obtained and/or recorded in the course of the telemedicine visit and may receive copies of available information for a reasonable fee.  I understand that some of the potential risks of receiving the Services via telemedicine include:   Delay or interruption in medical evaluation due to technological equipment failure or disruption;  Information transmitted may not be sufficient (e.g. poor resolution of images) to allow for appropriate medical decision making by the Practitioner; and/or   In rare instances, security protocols could fail, causing a breach of personal health information.  Furthermore, I acknowledge that it is my responsibility to provide information about my medical history, conditions and care that is complete and accurate to the best of my ability. I acknowledge that Practitioner's advice, recommendations, and/or decision may be based on factors not within their control, such as incomplete or inaccurate data provided by me or distortions of diagnostic images or specimens that may result from electronic transmissions. I  understand that the practice of medicine is not an exact science and that Practitioner makes no warranties or guarantees regarding treatment outcomes. I acknowledge that I will receive a copy of this consent concurrently upon execution via email to the email address I last provided but may also request a printed copy by calling the office of [facility].    I understand that my insurance will be billed for this visit.   I have read or had this consent read to me.  I understand the contents of this consent, which adequately explains the benefits and risks of the Services being provided via telemedicine.   I have been provided ample opportunity to ask questions regarding this consent and the Services and have had my questions  answered to my satisfaction.  I give my informed consent for the services to be provided through the use of telemedicine in my medical care  By participating in this telemedicine visit I agree to the above.

## 2018-10-07 NOTE — Progress Notes (Signed)
Virtual Visit via Telephone Note   This visit type was conducted due to national recommendations for restrictions regarding the COVID-19 Pandemic (e.g. social distancing) in an effort to limit this patient's exposure and mitigate transmission in our community.  Due to her co-morbid illnesses, this patient is at least at moderate risk for complications without adequate follow up.  This format is felt to be most appropriate for this patient at this time.  The patient did not have access to video technology/had technical difficulties with video requiring transitioning to audio format only (telephone).  All issues noted in this document were discussed and addressed.  No physical exam could be performed with this format.  Please refer to the patient's chart for her  consent to telehealth for [facility].   Date:  10/08/2018   ID:  [name], DOB [birth year], MRN [id number]  Patient Location: Home Provider Location: Office  PCP:  Sharilyn Sites, MD  Cardiologist:  Rozann Lesches, MD Electrophysiologist:  None   Evaluation Performed:  Follow-Up Visit  Chief Complaint:   Cardiac follow-up  History of Present Illness:    [name] is a 48 y.o. female last seen in December 2019.  She did not have video access and we spoke by phone today.  She does not report any new symptomatology since last encounter.  Stable NYHA class II dyspnea, no angina symptoms, no progressive sense of palpitations or syncope.  She is on Coumadin with follow-up in anticoagulation clinic.  Last INR was 3.2 in July.  She does not report any spontaneous bleeding problems.  Echocardiogram from July of last year showed LVEF 60 to 65% with normally functioning St. Jude mitral prosthesis.  The patient does not have symptoms concerning for COVID-19 infection (fever, chills, cough, or new shortness of breath).  She states that she wears a mask when she goes out.   Past Medical History:  Diagnosis Date  . Abnormal  Papanicolaou smear of cervix with positive human papilloma virus (HPV) test 08/17/2015  . Abnormality of heart beat   . Anticoagulation goal of INR 2.5 to 3.5   . Anxiety   . B12 deficiency   . BRCA1 negative   . BRCA2 negative   . Breast cancer, left breast ([place]) 2011   S/P mastectomy; chemo; "no radiation due to lupus"  . Bursitis of right knee    Septic bursitis  . Chronic lower back pain   . Drug-induced hepatitis    States per her rheumatologist, transaminases elevated but normalized after drug removed for   . Fibromyalgia   . GERD (gastroesophageal reflux disease)   . 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 History of abnormal cervical Pap smear 08/12/2015  . History of blood transfusion   . HSIL (high grade squamous intraepithelial lesion) on Pap smear of cervix 01/14/2017   colpo with Dr Elonda Husky   . Hypothyroidism   . Mitral valve disease, rheumatic    St. Jude prosthesis  . Peripheral neuropathy   . Pneumonia   . Raynaud phenomenon   . Rheumatoid arthritis ([place])   . SLE (systemic lupus erythematosus) ([place])    Past Surgical History:  Procedure Laterality Date  . APPENDECTOMY N/A 05/23/2016   Procedure: OPEN APPENDECTOMY WITH DRAINAGE OF ABSCESS;  Surgeon: Fanny Skates, MD;  Location: [facility];  Service: General;  Laterality: N/A;  . APPENDECTOMY    . BREAST BIOPSY Left 2012  . BREAST IMPLANT EXCHANGE Left 12/2015  . CARDIAC  CATHETERIZATION  2008  . CARDIAC VALVE REPLACEMENT    . CERVICAL CONIZATION W/BX N/A 03/06/2017   Procedure: Laser Conization of Cervix;  Surgeon: Florian Buff, MD;  Location: [facility];  Service: Gynecology;  Laterality: N/A;  . ENDOMETRIAL ABLATION  2008   She no longer has menses  . ESOPHAGOGASTRODUODENOSCOPY N/A 03/25/2013   Dr. Raliegh Scarlet reflux esophagitis-likely source of patient's symptoms (patulous EG junction). Hiatal hernia otherwise normal  . LAPAROSCOPIC CHOLECYSTECTOMY    . LIVER BIOPSY  11/28/2016   liver  core/notes 11/28/2016  . MASTECTOMY Left 2012  . MASTOPEXY  02/14/2011   Procedure: MASTOPEXY;  Surgeon: Macon Large;  Location: [facility];  Service: Plastics;  Laterality: Bilateral;  Right Breast Reduction   . MITRAL VALVE REPLACEMENT  2008  . TISSUE EXPANDER PLACEMENT  02/14/2011   Procedure: TISSUE EXPANDER;  Surgeon: Macon Large;  Location: [facility];  Service: Plastics;  Laterality: Bilateral;  Left Breast Remove Tissue Expander Placement of Implant Breast Reconstruction  . TISSUE EXPANDER PLACEMENT Left 11/16/2010   breasst  . TUBAL LIGATION       Current Meds  Medication Sig  . cyanocobalamin (,VITAMIN B-12,) 1000 MCG/ML injection Inject 1 mL (1,000 mcg total) into the muscle every 30 (thirty) days.  Marland Kitchen DEXILANT 60 MG capsule TAKE ONE CAPSULE BY MOUTH DAILY.  . famotidine (PEPCID) 40 MG tablet Take 1 tablet (40 mg total) by mouth at bedtime. As needed  . furosemide (LASIX) 40 MG tablet TAKE ONE TABLET BY MOUTH ONCE DAILY.  Marland Kitchen ibuprofen (ADVIL,MOTRIN) 800 MG tablet Take 800 mg by mouth 2 (two) times daily as needed for mild pain or moderate pain. Reported on 08/30/2015  . levothyroxine (SYNTHROID, LEVOTHROID) 88 MCG tablet Take 1 tablet (88 mcg total) by mouth daily.  . metoCLOPramide (REGLAN) 5 MG tablet TAKE (1) TABLET BY MOUTH TWICE DAILY.  Marland Kitchen ondansetron (ZOFRAN) 4 MG tablet Take 1 tablet (4 mg total) by mouth every 8 (eight) hours as needed for nausea or vomiting.  Marland Kitchen oxyCODONE-acetaminophen (PERCOCET/ROXICET) 5-325 MG tablet Take 1-2 tablets by mouth every 4 (four) hours as needed for severe pain.  . potassium chloride (KLOR-CON 10) 10 MEQ tablet Take 10 mEq by mouth every evening. Take one tablet daily as directed  . promethazine (PHENERGAN) 12.5 MG tablet Take 1 tablet (12.5 mg total) by mouth every 6 (six) hours as needed for nausea or vomiting.  . riTUXimab (RITUXAN) 100 MG/10ML injection Inject into the vein. Every 4 months  . rOPINIRole (REQUIP) 1 MG tablet Take 1 mg by mouth at  bedtime.  Marland Kitchen warfarin (COUMADIN) 5 MG tablet TAKE 1 & 1/2 TABLETS BY MOUTH DAILY OR AS DIRECTED  . zolpidem (AMBIEN) 10 MG tablet Take 10 mg by mouth at bedtime.      Allergies:   Methotrexate derivatives and Other   Social History   Tobacco Use  . Smoking status: Former Smoker    Packs/day: 1.00    Years: 15.00    Pack years: 15.00    Types: Cigarettes    Quit date: 02/19/2006    Years since quitting: 12.6  . Smokeless tobacco: Never Used  Substance Use Topics  . Alcohol use: Not Currently    Alcohol/week: 0.0 standard drinks    Comment: 11/28/2016 "couple drinks/yearZ"  . Drug use: No     Family Hx: The patient's family history includes Hyperlipidemia in her mother; Hypertension in her father and mother. There is no history of Colon cancer or Colon  polyps.  ROS:   Please see the history of present illness. All other systems reviewed and are negative.   Prior CV studies:   The following studies were reviewed today:  Echocardiogram 08/19/2017: Study Conclusions  - Left ventricle: The cavity size was normal. Wall thickness was   increased in a pattern of mild LVH. Systolic function was normal.   The estimated ejection fraction was in the range of 60% to 65%.   Wall motion was normal; there were no regional wall motion   abnormalities. The study is not technically sufficient to allow   evaluation of LV diastolic function due to the presence of a   mechanical mitral prosthesis. - Mitral valve: Normally functioning mechanical mitral prosthesis   (St. Jude). Mean gradient (D): 3 mm Hg.  Labs/Other Tests and Data Reviewed:    EKG:  An ECG dated 07/23/2017 was personally reviewed today and demonstrated:  Sinus rhythm with R' in lead V1.  Recent Labs: 11/26/2017: ALT 24; BUN 18; Creatinine, Ser 0.55; Hemoglobin 13.7; Platelets 213; Potassium 3.8; Sodium 139 01/06/2018: TSH 1.32    Wt Readings from Last 3 Encounters:  10/08/18 157 lb (71.2 kg)  10/01/18 159 lb 9.6 oz (72.4  kg)  07/06/18 160 lb (72.6 kg)     Objective:    Vital Signs:  BP 105/70   Ht 5' 4"  (1.626 m)   Wt 157 lb (71.2 kg)   BMI 26.95 kg/m    Patient spoke in full sentences, not short of breath. No audible wheezing or coughing. Speech pattern normal.  ASSESSMENT & PLAN:    1.  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  She is symptomatically stable and remains on Coumadin.  Echocardiogram from last year is outlined above.  2.  Intermittent palpitations with suspected aberrantly conducted SVT by prior cardiac monitor.  She does not report any new symptoms and has been stable off Toprol-XL.  Continue observation.  COVID-19 Education: The signs and symptoms of COVID-19 were discussed with the patient and how to seek care for testing (follow up with PCP or arrange E-visit).  The importance of social distancing was discussed today.  Time:   Today, I have spent 5 minutes with the patient with telehealth technology discussing the above problems.     Medication Adjustments/Labs and Tests Ordered: Current medicines are reviewed at length with the patient today.  Concerns regarding medicines are outlined above.   Tests Ordered: No orders of the defined types were placed in this encounter.   Medication Changes: No orders of the defined types were placed in this encounter.   Follow Up:  In Person 6 months in the [place] office.  Signed, Rozann Lesches, MD  10/08/2018 8:52 AM    [place]

## 2018-10-08 ENCOUNTER — Encounter: Payer: Self-pay | Admitting: Cardiology

## 2018-10-08 ENCOUNTER — Telehealth (INDEPENDENT_AMBULATORY_CARE_PROVIDER_SITE_OTHER): Payer: BC Managed Care – PPO | Admitting: Cardiology

## 2018-10-08 VITALS — BP 105/70 | Ht 64.0 in | Wt 157.0 lb

## 2018-10-08 DIAGNOSIS — R002 Palpitations: Secondary | ICD-10-CM

## 2018-10-08 DIAGNOSIS — Z952 Presence of prosthetic heart valve: Secondary | ICD-10-CM

## 2018-10-08 NOTE — Patient Instructions (Addendum)
Medication Instructions:   Your physician recommends that you continue on your current medications as directed. Please refer to the Current Medication list given to you today.  Labwork:  NONE  Testing/Procedures:  NONE  Follow-Up:  Your physician recommends that you schedule a follow-up appointment in: 6 months at the [facility] office. You will receive a reminder letter in the mail in about 4 months reminding you to call and schedule your appointment. If you don't receive this letter, please contact our office.  Any Other Special Instructions Will Be Listed Below (If Applicable).  If you need a refill on your cardiac medications before your next appointment, please call your pharmacy.

## 2018-10-14 ENCOUNTER — Ambulatory Visit (INDEPENDENT_AMBULATORY_CARE_PROVIDER_SITE_OTHER): Payer: BC Managed Care – PPO | Admitting: *Deleted

## 2018-10-14 ENCOUNTER — Other Ambulatory Visit: Payer: Self-pay

## 2018-10-14 DIAGNOSIS — Z952 Presence of prosthetic heart valve: Secondary | ICD-10-CM

## 2018-10-14 DIAGNOSIS — Z5181 Encounter for therapeutic drug level monitoring: Secondary | ICD-10-CM

## 2018-10-14 DIAGNOSIS — D6859 Other primary thrombophilia: Secondary | ICD-10-CM

## 2018-10-14 DIAGNOSIS — Z1211 Encounter for screening for malignant neoplasm of colon: Secondary | ICD-10-CM

## 2018-10-14 LAB — POCT INR: INR: 3.3 — AB (ref 2.0–3.0)

## 2018-10-14 NOTE — Patient Instructions (Signed)
Continue coumadin 1 1/2 tablets daily except 1 tablet on Mondays and Thursdays  Recheck in 5 weeks

## 2018-10-16 DIAGNOSIS — G894 Chronic pain syndrome: Secondary | ICD-10-CM | POA: Diagnosis not present

## 2018-11-12 ENCOUNTER — Other Ambulatory Visit: Payer: Self-pay | Admitting: Gastroenterology

## 2018-11-17 DIAGNOSIS — G894 Chronic pain syndrome: Secondary | ICD-10-CM | POA: Diagnosis not present

## 2018-11-20 ENCOUNTER — Other Ambulatory Visit (HOSPITAL_COMMUNITY): Payer: Self-pay | Admitting: Obstetrics & Gynecology

## 2018-11-20 DIAGNOSIS — Z1231 Encounter for screening mammogram for malignant neoplasm of breast: Secondary | ICD-10-CM

## 2018-11-25 ENCOUNTER — Other Ambulatory Visit: Payer: Self-pay

## 2018-11-25 ENCOUNTER — Inpatient Hospital Stay (HOSPITAL_COMMUNITY): Payer: BC Managed Care – PPO | Attending: Hematology

## 2018-11-25 DIAGNOSIS — Z853 Personal history of malignant neoplasm of breast: Secondary | ICD-10-CM | POA: Insufficient documentation

## 2018-11-25 DIAGNOSIS — C50412 Malignant neoplasm of upper-outer quadrant of left female breast: Secondary | ICD-10-CM

## 2018-11-25 DIAGNOSIS — D589 Hereditary hemolytic anemia, unspecified: Secondary | ICD-10-CM | POA: Insufficient documentation

## 2018-11-25 DIAGNOSIS — Z9221 Personal history of antineoplastic chemotherapy: Secondary | ICD-10-CM | POA: Insufficient documentation

## 2018-11-25 DIAGNOSIS — Z7901 Long term (current) use of anticoagulants: Secondary | ICD-10-CM | POA: Diagnosis not present

## 2018-11-25 DIAGNOSIS — M069 Rheumatoid arthritis, unspecified: Secondary | ICD-10-CM | POA: Diagnosis not present

## 2018-11-25 DIAGNOSIS — Z791 Long term (current) use of non-steroidal anti-inflammatories (NSAID): Secondary | ICD-10-CM | POA: Diagnosis not present

## 2018-11-25 DIAGNOSIS — Z79899 Other long term (current) drug therapy: Secondary | ICD-10-CM | POA: Diagnosis not present

## 2018-11-25 DIAGNOSIS — E039 Hypothyroidism, unspecified: Secondary | ICD-10-CM | POA: Diagnosis not present

## 2018-11-25 DIAGNOSIS — Z87891 Personal history of nicotine dependence: Secondary | ICD-10-CM | POA: Insufficient documentation

## 2018-11-25 DIAGNOSIS — E538 Deficiency of other specified B group vitamins: Secondary | ICD-10-CM | POA: Insufficient documentation

## 2018-11-25 DIAGNOSIS — Z171 Estrogen receptor negative status [ER-]: Secondary | ICD-10-CM | POA: Diagnosis not present

## 2018-11-25 DIAGNOSIS — Z9012 Acquired absence of left breast and nipple: Secondary | ICD-10-CM | POA: Diagnosis not present

## 2018-11-25 DIAGNOSIS — Z952 Presence of prosthetic heart valve: Secondary | ICD-10-CM | POA: Diagnosis not present

## 2018-11-25 LAB — COMPREHENSIVE METABOLIC PANEL
ALT: 16 U/L (ref 0–44)
AST: 22 U/L (ref 15–41)
Albumin: 4 g/dL (ref 3.5–5.0)
Alkaline Phosphatase: 80 U/L (ref 38–126)
Anion gap: 8 (ref 5–15)
BUN: 10 mg/dL (ref 6–20)
CO2: 25 mmol/L (ref 22–32)
Calcium: 9.1 mg/dL (ref 8.9–10.3)
Chloride: 107 mmol/L (ref 98–111)
Creatinine, Ser: 0.6 mg/dL (ref 0.44–1.00)
GFR calc Af Amer: >60 mL/min (ref >60)
GFR calc non Af Amer: >60 mL/min (ref >60)
Glucose, Bld: 88 mg/dL (ref 70–99)
Potassium: 4.3 mmol/L (ref 3.5–5.1)
Sodium: 140 mmol/L (ref 135–145)
Total Bilirubin: 0.6 mg/dL (ref 0.3–1.2)
Total Protein: 7.5 g/dL (ref 6.5–8.1)

## 2018-11-25 LAB — CBC WITH DIFFERENTIAL/PLATELET
Abs Immature Granulocytes: 0.02 10*3/uL (ref 0.00–0.07)
Basophils Absolute: 0 10*3/uL (ref 0.0–0.1)
Basophils Relative: 1 %
Eosinophils Absolute: 0.2 10*3/uL (ref 0.0–0.5)
Eosinophils Relative: 5 %
HCT: 38 % (ref 36.0–46.0)
Hemoglobin: 12.4 g/dL (ref 12.0–15.0)
Immature Granulocytes: 0 %
Lymphocytes Relative: 16 %
Lymphs Abs: 0.8 10*3/uL (ref 0.7–4.0)
MCH: 30.2 pg (ref 26.0–34.0)
MCHC: 32.6 g/dL (ref 30.0–36.0)
MCV: 92.5 fL (ref 80.0–100.0)
Monocytes Absolute: 0.4 10*3/uL (ref 0.1–1.0)
Monocytes Relative: 9 %
Neutro Abs: 3.4 10*3/uL (ref 1.7–7.7)
Neutrophils Relative %: 69 %
Platelets: 218 10*3/uL (ref 150–400)
RBC: 4.11 MIL/uL (ref 3.87–5.11)
RDW: 13.2 % (ref 11.5–15.5)
WBC: 4.9 10*3/uL (ref 4.0–10.5)
nRBC: 0 % (ref 0.0–0.2)

## 2018-11-27 ENCOUNTER — Other Ambulatory Visit: Payer: Self-pay

## 2018-11-27 ENCOUNTER — Ambulatory Visit (INDEPENDENT_AMBULATORY_CARE_PROVIDER_SITE_OTHER): Payer: BC Managed Care – PPO | Admitting: *Deleted

## 2018-11-27 DIAGNOSIS — D6859 Other primary thrombophilia: Secondary | ICD-10-CM

## 2018-11-27 DIAGNOSIS — Z952 Presence of prosthetic heart valve: Secondary | ICD-10-CM

## 2018-11-27 DIAGNOSIS — Z5181 Encounter for therapeutic drug level monitoring: Secondary | ICD-10-CM

## 2018-11-27 LAB — POCT INR: INR: 2.5 (ref 2.0–3.0)

## 2018-11-27 NOTE — Patient Instructions (Signed)
Continue coumadin 1 1/2 tablets daily except 1 tablet on Mondays and Thursdays  Recheck in 6 weeks

## 2018-12-02 ENCOUNTER — Ambulatory Visit (HOSPITAL_COMMUNITY): Payer: Medicare Other | Admitting: Hematology

## 2018-12-04 ENCOUNTER — Other Ambulatory Visit: Payer: Self-pay

## 2018-12-04 ENCOUNTER — Other Ambulatory Visit (HOSPITAL_COMMUNITY)
Admission: RE | Admit: 2018-12-04 | Discharge: 2018-12-04 | Disposition: A | Discharge status: Home / Self Care | Payer: BC Managed Care – PPO | Source: Ambulatory Visit | Attending: Obstetrics & Gynecology | Admitting: Obstetrics & Gynecology

## 2018-12-04 ENCOUNTER — Encounter: Payer: Self-pay | Admitting: Obstetrics & Gynecology

## 2018-12-04 ENCOUNTER — Ambulatory Visit (INDEPENDENT_AMBULATORY_CARE_PROVIDER_SITE_OTHER): Payer: BC Managed Care – PPO | Admitting: Obstetrics & Gynecology

## 2018-12-04 VITALS — BP 108/83 | HR 64 | Ht 64.0 in | Wt 162.0 lb

## 2018-12-04 DIAGNOSIS — R87610 Atypical squamous cells of undetermined significance on cytologic smear of cervix (ASC-US): Secondary | ICD-10-CM | POA: Diagnosis not present

## 2018-12-04 DIAGNOSIS — Z124 Encounter for screening for malignant neoplasm of cervix: Secondary | ICD-10-CM | POA: Diagnosis not present

## 2018-12-04 DIAGNOSIS — Z1151 Encounter for screening for human papillomavirus (HPV): Secondary | ICD-10-CM | POA: Insufficient documentation

## 2018-12-04 DIAGNOSIS — R87810 Cervical high risk human papillomavirus (HPV) DNA test positive: Secondary | ICD-10-CM | POA: Insufficient documentation

## 2018-12-04 DIAGNOSIS — Z01419 Encounter for gynecological examination (general) (routine) without abnormal findings: Secondary | ICD-10-CM | POA: Insufficient documentation

## 2018-12-04 NOTE — Progress Notes (Signed)
lab

## 2018-12-04 NOTE — Progress Notes (Signed)
Subjective:     [name] is a 48 y.o. woman who comes in today for a  pap smear only. Her most recent annual exam was on 03/2018. Her most recent Pap smear was on 03/2018 and showed ASCUS with POSITIVE high risk HPV. Previous abnormal Pap smears: yes - s/p Laser conization for HSIL in past on chronic immune suppression. Contraception: tubal ligation and ablation  The following portions of the patient's history were reviewed and updated as appropriate: allergies, current medications, past family history, past medical history, past social history, past surgical history and problem list.  Review of Systems Pertinent items are noted in HPI.   Objective:    BP 108/83 (BP Location: Right Arm, Patient Position: Sitting, Cuff Size: Normal)   Pulse 64   Ht 5\' 4"  (1.626 m)   Wt 162 lb (73.5 kg)   BMI 27.81 kg/m  Pelvic Exam: cervix normal in appearance, external genitalia normal and vagina normal without discharge. Pap smear obtained.   Assessment:    Screening pap smear.   Plan:    Follow up in 6 months, or as indicated by Pap results  . Patient ID: [name], female   DOB: 05-27-1970, 48 y.o.   MRN: [id number]

## 2018-12-05 ENCOUNTER — Inpatient Hospital Stay (HOSPITAL_COMMUNITY): Admission: RE | Admit: 2018-12-05 | Payer: BC Managed Care – PPO | Source: Ambulatory Visit

## 2018-12-08 DIAGNOSIS — H209 Unspecified iridocyclitis: Secondary | ICD-10-CM | POA: Diagnosis not present

## 2018-12-08 DIAGNOSIS — H18222 Idiopathic corneal edema, left eye: Secondary | ICD-10-CM | POA: Diagnosis not present

## 2018-12-08 DIAGNOSIS — H4042X3 Glaucoma secondary to eye inflammation, left eye, severe stage: Secondary | ICD-10-CM | POA: Diagnosis not present

## 2018-12-08 DIAGNOSIS — H18892 Other specified disorders of cornea, left eye: Secondary | ICD-10-CM | POA: Diagnosis not present

## 2018-12-09 ENCOUNTER — Other Ambulatory Visit: Payer: Self-pay | Admitting: Cardiology

## 2018-12-10 DIAGNOSIS — H4042X3 Glaucoma secondary to eye inflammation, left eye, severe stage: Secondary | ICD-10-CM | POA: Diagnosis not present

## 2018-12-10 DIAGNOSIS — H209 Unspecified iridocyclitis: Secondary | ICD-10-CM | POA: Diagnosis not present

## 2018-12-10 DIAGNOSIS — H18222 Idiopathic corneal edema, left eye: Secondary | ICD-10-CM | POA: Diagnosis not present

## 2018-12-11 DIAGNOSIS — H209 Unspecified iridocyclitis: Secondary | ICD-10-CM | POA: Diagnosis not present

## 2018-12-11 DIAGNOSIS — M0609 Rheumatoid arthritis without rheumatoid factor, multiple sites: Secondary | ICD-10-CM | POA: Diagnosis not present

## 2018-12-11 DIAGNOSIS — M329 Systemic lupus erythematosus, unspecified: Secondary | ICD-10-CM | POA: Diagnosis not present

## 2018-12-11 DIAGNOSIS — Z79899 Other long term (current) drug therapy: Secondary | ICD-10-CM | POA: Diagnosis not present

## 2018-12-11 DIAGNOSIS — M2550 Pain in unspecified joint: Secondary | ICD-10-CM | POA: Diagnosis not present

## 2018-12-12 DIAGNOSIS — G894 Chronic pain syndrome: Secondary | ICD-10-CM | POA: Diagnosis not present

## 2018-12-12 LAB — CYTOLOGY - PAP
Chlamydia: NEGATIVE
Comment: NEGATIVE
Comment: NEGATIVE
Comment: NEGATIVE
Comment: NEGATIVE
Comment: NORMAL
Diagnosis: NEGATIVE
HPV 16: NEGATIVE
HPV 18 / 45: NEGATIVE
High risk HPV: POSITIVE — AB
Neisseria Gonorrhea: NEGATIVE

## 2018-12-15 DIAGNOSIS — H18222 Idiopathic corneal edema, left eye: Secondary | ICD-10-CM | POA: Diagnosis not present

## 2018-12-15 DIAGNOSIS — H4042X3 Glaucoma secondary to eye inflammation, left eye, severe stage: Secondary | ICD-10-CM | POA: Diagnosis not present

## 2018-12-15 DIAGNOSIS — H209 Unspecified iridocyclitis: Secondary | ICD-10-CM | POA: Diagnosis not present

## 2018-12-18 ENCOUNTER — Other Ambulatory Visit: Payer: Self-pay

## 2018-12-18 ENCOUNTER — Inpatient Hospital Stay (HOSPITAL_BASED_OUTPATIENT_CLINIC_OR_DEPARTMENT_OTHER): Payer: BC Managed Care – PPO | Admitting: Hematology

## 2018-12-18 VITALS — BP 99/82 | HR 65 | Temp 97.5°F | Resp 18 | Wt 162.4 lb

## 2018-12-18 DIAGNOSIS — Z171 Estrogen receptor negative status [ER-]: Secondary | ICD-10-CM

## 2018-12-18 DIAGNOSIS — Z9012 Acquired absence of left breast and nipple: Secondary | ICD-10-CM | POA: Diagnosis not present

## 2018-12-18 DIAGNOSIS — E538 Deficiency of other specified B group vitamins: Secondary | ICD-10-CM | POA: Diagnosis not present

## 2018-12-18 DIAGNOSIS — D589 Hereditary hemolytic anemia, unspecified: Secondary | ICD-10-CM | POA: Diagnosis not present

## 2018-12-18 DIAGNOSIS — Z952 Presence of prosthetic heart valve: Secondary | ICD-10-CM | POA: Diagnosis not present

## 2018-12-18 DIAGNOSIS — E039 Hypothyroidism, unspecified: Secondary | ICD-10-CM | POA: Diagnosis not present

## 2018-12-18 DIAGNOSIS — Z7901 Long term (current) use of anticoagulants: Secondary | ICD-10-CM | POA: Diagnosis not present

## 2018-12-18 DIAGNOSIS — M069 Rheumatoid arthritis, unspecified: Secondary | ICD-10-CM | POA: Diagnosis not present

## 2018-12-18 DIAGNOSIS — Z791 Long term (current) use of non-steroidal anti-inflammatories (NSAID): Secondary | ICD-10-CM | POA: Diagnosis not present

## 2018-12-18 DIAGNOSIS — C50412 Malignant neoplasm of upper-outer quadrant of left female breast: Secondary | ICD-10-CM | POA: Diagnosis not present

## 2018-12-18 DIAGNOSIS — Z853 Personal history of malignant neoplasm of breast: Secondary | ICD-10-CM | POA: Diagnosis not present

## 2018-12-18 DIAGNOSIS — Z87891 Personal history of nicotine dependence: Secondary | ICD-10-CM | POA: Diagnosis not present

## 2018-12-18 DIAGNOSIS — Z9221 Personal history of antineoplastic chemotherapy: Secondary | ICD-10-CM | POA: Diagnosis not present

## 2018-12-18 DIAGNOSIS — Z79899 Other long term (current) drug therapy: Secondary | ICD-10-CM | POA: Diagnosis not present

## 2018-12-18 NOTE — Assessment & Plan Note (Signed)
1.  Triple negative left breast cancer: - Status post neoadjuvant chemotherapy with FEC x2 cycles followed by mastectomy on 04/20/2010, pathology showing 1.6 cm grade 3, margins negative, Ki-67 90%, ER/PR/HER-2 negative - Adjuvant chemotherapy with TC x4 cycles followed by left breast reconstruction and right breast mammoplasty. - mammogram of the right breast dated 11/25/2017 which was BI-RADS 1. -Patient has an upcoming mammogram scheduled for November 6.  Today's physical exam did not reveal any palpable masses or adenopathy. -We will continue to follow her.  She will return to clinic in 1 year.  2.  Hemolytic anemia: -Patient had episode of hemolytic anemia in 2009 secondary to her underlying lupus. -Hemoglobin is within normal today at 12.4.  3.  Lupus: - She has joint pains and Raynaud's phenomena. - She is receiving rituximab infusions once every 4 to 6 months for the past 4 years which is controlling it. -She is followed by rheumatology.  4.  Mechanical mitral valve: -She follows up at the Coumadin clinic.

## 2018-12-18 NOTE — Progress Notes (Signed)
[place], [place] [address]   CLINIC:  Medical Oncology/Hematology  PCP:  Sharilyn Sites, [place] [address] [phone]   REASON FOR VISIT:  Follow-up for Breast Cancer   CURRENT THERAPY: Clinical Survillenance   BRIEF ONCOLOGIC HISTORY:  Oncology History  Malignant neoplasm of upper outer quadrant of female breast ([place])  12/28/2009 Initial Diagnosis   Left needle biopsy- invasive ductal ca, grade 3, triple negative with Ki-67 92%   01/09/2010 Survivorship   BRCA1 and BRCA2 negative   01/31/2010 - 03/03/2010 Chemotherapy   FEC x 2 cycles, neoadjuvantly   04/20/2010 Surgery   Left simple mastectomy with superior flap excision showing a 1.6 cm invasive ductal carcinoma, grade 3, with negative resection margins, no LVI, 0/3 lymph nodes.  Triple negative with Ki-67 of 92%.   06/05/2010 - 08/28/2010 Chemotherapy   TC x 4 cycles      CANCER STAGING: Cancer Staging Malignant neoplasm of upper outer quadrant of female breast ([place]) Staging form: Breast, AJCC 7th Edition - Clinical: Stage IIA (T2, N0, cM0) - Signed by Baird Cancer, PA on 08/16/2010 - Pathologic: No stage assigned - Unsigned    INTERVAL HISTORY:  Ms. Bushard 48 y.o. female presents today for follow up. She reports overall doing well.  Her main concern today is a cornea abrasion on her left eye. She is being followed by opthamologist. She denies any changes in her breast. No new lumps or masses.  She is here for repeat labs and office visit.   REVIEW OF SYSTEMS:  Review of Systems  Constitutional: Negative.   HENT:  Negative.   Eyes: Positive for eye problems.  Respiratory: Negative.   Cardiovascular: Negative.   Gastrointestinal: Negative.   Endocrine: Negative.   Genitourinary: Negative.    Musculoskeletal: Negative.   Skin: Negative.   Neurological: Negative.   Hematological: Negative.   Psychiatric/Behavioral: Negative.       PAST MEDICAL/SURGICAL HISTORY:  Past Medical History:  Diagnosis Date  . Abnormal Papanicolaou smear of cervix with positive human papilloma virus (HPV) test 08/17/2015  . Abnormality of heart beat   . Anticoagulation goal of INR 2.5 to 3.5   . Anxiety   . B12 deficiency   . BRCA1 negative   . BRCA2 negative   . Breast cancer, left breast ([place]) 2011   S/P mastectomy; chemo; "no radiation due to lupus"  . Bursitis of right knee    Septic bursitis  . Chronic lower back pain   . Drug-induced hepatitis    States per her rheumatologist, transaminases elevated but normalized after drug removed for   . Fibromyalgia   . GERD (gastroesophageal reflux disease)   . 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 History of abnormal cervical Pap smear 08/12/2015  . History of blood transfusion   . HSIL (high grade squamous intraepithelial lesion) on Pap smear of cervix 01/14/2017   colpo with Dr Elonda Husky   . Hypothyroidism   . Mitral valve disease, rheumatic    St. Jude prosthesis  . Peripheral neuropathy   . Pneumonia   . Raynaud phenomenon   . Rheumatoid arthritis ([facility])   . SLE (systemic lupus erythematosus) ([place])    Past Surgical History:  Procedure Laterality Date  . APPENDECTOMY N/A 05/23/2016   Procedure: OPEN APPENDECTOMY WITH DRAINAGE OF ABSCESS;  Surgeon: Fanny Skates, MD;  Location: [facility];  Service: General;  Laterality: N/A;  . APPENDECTOMY    .  BREAST BIOPSY Left 2012  . BREAST IMPLANT EXCHANGE Left 12/2015  . CARDIAC CATHETERIZATION  2008  . CARDIAC VALVE REPLACEMENT    . CERVICAL CONIZATION W/BX N/A 03/06/2017   Procedure: Laser Conization of Cervix;  Surgeon: Florian Buff, MD;  Location: [facility];  Service: Gynecology;  Laterality: N/A;  . ENDOMETRIAL ABLATION  2008   She no longer has menses  . ESOPHAGOGASTRODUODENOSCOPY N/A 03/25/2013   Dr. Raliegh Scarlet reflux esophagitis-likely source of patient's symptoms (patulous EG junction). Hiatal hernia  otherwise normal  . LAPAROSCOPIC CHOLECYSTECTOMY    . LIVER BIOPSY  11/28/2016   liver core/notes 11/28/2016  . MASTECTOMY Left 2012  . MASTOPEXY  02/14/2011   Procedure: MASTOPEXY;  Surgeon: Macon Large;  Location: [facility];  Service: Plastics;  Laterality: Bilateral;  Right Breast Reduction   . MITRAL VALVE REPLACEMENT  2008  . TISSUE EXPANDER PLACEMENT  02/14/2011   Procedure: TISSUE EXPANDER;  Surgeon: Macon Large;  Location: [facility];  Service: Plastics;  Laterality: Bilateral;  Left Breast Remove Tissue Expander Placement of Implant Breast Reconstruction  . TISSUE EXPANDER PLACEMENT Left 11/16/2010   breasst  . TUBAL LIGATION       SOCIAL HISTORY:  Social History   Socioeconomic History  . Marital status: Married    Spouse name: Not on file  . Number of children: 2  . Years of education: Not on file  . Highest education level: Not on file  Occupational History  . Not on file  Social Needs  . Financial resource strain: Not on file  . Food insecurity    Worry: Not on file    Inability: Not on file  . Transportation needs    Medical: Not on file    Non-medical: Not on file  Tobacco Use  . Smoking status: Former Smoker    Packs/day: 1.00    Years: 15.00    Pack years: 15.00    Types: Cigarettes    Quit date: 02/19/2006    Years since quitting: 12.8  . Smokeless tobacco: Never Used  Substance and Sexual Activity  . Alcohol use: Not Currently    Alcohol/week: 0.0 standard drinks    Comment: 11/28/2016 "couple drinks/yearZ"  . Drug use: No  . Sexual activity: Yes    Birth control/protection: Surgical    Comment: tubal and ablation  Lifestyle  . Physical activity    Days per week: Not on file    Minutes per session: Not on file  . Stress: Not on file  Relationships  . Social Herbalist on phone: Not on file    Gets together: Not on file    Attends religious service: Not on file    Active member of club or organization: Not on file    Attends  meetings of clubs or organizations: Not on file    Relationship status: Not on file  . Intimate partner violence    Fear of current or ex partner: Not on file    Emotionally abused: Not on file    Physically abused: Not on file    Forced sexual activity: Not on file  Other Topics Concern  . Not on file  Social History Narrative  . Not on file    FAMILY HISTORY:  Family History  Problem Relation Age of Onset  . Hypertension Mother   . Hyperlipidemia Mother   . Hypertension Father   . Colon cancer Neg Hx   . Colon polyps Neg Hx  CURRENT MEDICATIONS:  Outpatient Encounter Medications as of 12/18/2018  Medication Sig Note  . cyanocobalamin (,VITAMIN B-12,) 1000 MCG/ML injection Inject 1 mL (1,000 mcg total) into the muscle every 30 (thirty) days. 11/29/2016: 3rd of every month  . DEXILANT 60 MG capsule TAKE ONE CAPSULE BY MOUTH DAILY.   . furosemide (LASIX) 40 MG tablet TAKE ONE TABLET BY MOUTH ONCE DAILY.   Marland Kitchen levothyroxine (SYNTHROID, LEVOTHROID) 88 MCG tablet Take 1 tablet (88 mcg total) by mouth daily.   . metoCLOPramide (REGLAN) 5 MG tablet TAKE (1) TABLET BY MOUTH TWICE DAILY.   Marland Kitchen potassium chloride (KLOR-CON 10) 10 MEQ tablet Take 10 mEq by mouth every evening. Take one tablet daily as directed   . riTUXimab (RITUXAN) 100 MG/10ML injection Inject into the vein. Every 4 months 11/29/2016: Per pt next dose due 12/18/16  . rOPINIRole (REQUIP) 1 MG tablet Take 1 mg by mouth at bedtime.   Marland Kitchen warfarin (COUMADIN) 5 MG tablet TAKE 1 & 1/2 TABLETS BY MOUTH DAILY OR AS DIRECTED   . zolpidem (AMBIEN) 10 MG tablet Take 10 mg by mouth at bedtime.    . [DISCONTINUED] famotidine (PEPCID) 40 MG tablet Take 1 tablet (40 mg total) by mouth at bedtime. As needed   . ibuprofen (ADVIL,MOTRIN) 800 MG tablet Take 800 mg by mouth 2 (two) times daily as needed for mild pain or moderate pain. Reported on 08/30/2015   . ondansetron (ZOFRAN) 4 MG tablet Take 1 tablet (4 mg total) by mouth every 8  (eight) hours as needed for nausea or vomiting. (Patient not taking: Reported on 12/18/2018)   . oxyCODONE-acetaminophen (PERCOCET/ROXICET) 5-325 MG tablet Take 1-2 tablets by mouth every 4 (four) hours as needed for severe pain. (Patient not taking: Reported on 12/18/2018)   . promethazine (PHENERGAN) 12.5 MG tablet Take 1 tablet (12.5 mg total) by mouth every 6 (six) hours as needed for nausea or vomiting. (Patient not taking: Reported on 12/18/2018)    No facility-administered encounter medications on file as of 12/18/2018.     ALLERGIES:  Allergies  Allergen Reactions  . Methotrexate Derivatives Other (See Comments)    Raised liver enzymes   . 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 kph     PHYSICAL EXAM:  ECOG Performance status: 1  Vitals:   12/18/18 0839  BP: 99/82  Pulse: 65  Resp: 18  Temp: (!) 97.5 F (36.4 C)  SpO2: 100%   Filed Weights   12/18/18 0839  Weight: 162 lb 6.4 oz (73.7 kg)    Physical Exam Constitutional:      Appearance: Normal appearance.  HENT:     Head: Normocephalic.     Right Ear: External ear normal.     Left Ear: External ear normal.     Nose: Nose normal.     Mouth/Throat:     Mouth: Mucous membranes are moist.  Neck:     Musculoskeletal: Normal range of motion.  Cardiovascular:     Rate and Rhythm: Normal rate and regular rhythm.     Pulses: Normal pulses.     Heart sounds: Normal heart sounds.  Pulmonary:     Effort: Pulmonary effort is normal.     Breath sounds: Normal breath sounds.  Abdominal:     General: Bowel sounds are normal.  Musculoskeletal: Normal range of motion.  Skin:    General: Skin  is warm.  Neurological:     General: No focal deficit present.     Mental Status: She is alert. Mental status is at baseline.  Psychiatric:        Mood and Affect: Mood normal.        Behavior: Behavior normal.        Thought  Content: Thought content normal.      LABORATORY DATA:  I have reviewed the labs as listed.  CBC    Component Value Date/Time   WBC 4.9 11/25/2018 1058   RBC 4.11 11/25/2018 1058   HGB 12.4 11/25/2018 1058   HGB 11.6 03/18/2017 1056   HCT 38.0 11/25/2018 1058   HCT 35.2 03/18/2017 1056   PLT 218 11/25/2018 1058   PLT 252 03/18/2017 1056   MCV 92.5 11/25/2018 1058   MCV 90 03/18/2017 1056   MCH 30.2 11/25/2018 1058   MCHC 32.6 11/25/2018 1058   RDW 13.2 11/25/2018 1058   RDW 14.3 03/18/2017 1056   LYMPHSABS 0.8 11/25/2018 1058   MONOABS 0.4 11/25/2018 1058   EOSABS 0.2 11/25/2018 1058   BASOSABS 0.0 11/25/2018 1058   CMP Latest Ref Rng & Units 11/25/2018 11/26/2017 03/16/2017  Glucose 70 - 99 mg/dL 88 88 107(H)  BUN 6 - 20 mg/dL 10 18 12   Creatinine 0.44 - 1.00 mg/dL 0.60 0.55 0.77  Sodium 135 - 145 mmol/L 140 139 137  Potassium 3.5 - 5.1 mmol/L 4.3 3.8 3.4(L)  Chloride 98 - 111 mmol/L 107 106 101  CO2 22 - 32 mmol/L 25 23 24   Calcium 8.9 - 10.3 mg/dL 9.1 9.3 8.9  Total Protein 6.5 - 8.1 g/dL 7.5 8.2(H) -  Total Bilirubin 0.3 - 1.2 mg/dL 0.6 0.6 -  Alkaline Phos 38 - 126 U/L 80 74 -  AST 15 - 41 U/L 22 27 -  ALT 0 - 44 U/L 16 24 -          ASSESSMENT & PLAN:   Malignant neoplasm of upper outer quadrant of female breast 1.  Triple negative left breast cancer: - Status post neoadjuvant chemotherapy with FEC x2 cycles followed by mastectomy on 04/20/2010, pathology showing 1.6 cm grade 3, margins negative, Ki-67 90%, ER/PR/HER-2 negative - Adjuvant chemotherapy with TC x4 cycles followed by left breast reconstruction and right breast mammoplasty. - mammogram of the right breast dated 11/25/2017 which was BI-RADS 1. -Patient has an upcoming mammogram scheduled for November 6.  Today's physical exam did not reveal any palpable masses or adenopathy. -We will continue to follow her.  She will return to clinic in 1 year.  2.  Hemolytic anemia: -Patient had episode of  hemolytic anemia in 2009 secondary to her underlying lupus. -Hemoglobin is within normal today at 12.4.  3.  Lupus: - She has joint pains and Raynaud's phenomena. - She is receiving rituximab infusions once every 4 to 6 months for the past 4 years which is controlling it. -She is followed by rheumatology.  4.  Mechanical mitral valve: -She follows up at the Coumadin clinic.      Orders placed this encounter:  Orders Placed This Encounter  Procedures  . MM Digital Screening [place] [phone]

## 2018-12-19 DIAGNOSIS — H4042X3 Glaucoma secondary to eye inflammation, left eye, severe stage: Secondary | ICD-10-CM | POA: Diagnosis not present

## 2018-12-19 DIAGNOSIS — H209 Unspecified iridocyclitis: Secondary | ICD-10-CM | POA: Diagnosis not present

## 2018-12-22 DIAGNOSIS — H209 Unspecified iridocyclitis: Secondary | ICD-10-CM | POA: Diagnosis not present

## 2018-12-22 DIAGNOSIS — H4042X3 Glaucoma secondary to eye inflammation, left eye, severe stage: Secondary | ICD-10-CM | POA: Diagnosis not present

## 2018-12-24 DIAGNOSIS — H209 Unspecified iridocyclitis: Secondary | ICD-10-CM | POA: Diagnosis not present

## 2018-12-24 DIAGNOSIS — H4042X3 Glaucoma secondary to eye inflammation, left eye, severe stage: Secondary | ICD-10-CM | POA: Diagnosis not present

## 2018-12-24 DIAGNOSIS — M3210 Systemic lupus erythematosus, organ or system involvement unspecified: Secondary | ICD-10-CM | POA: Diagnosis not present

## 2018-12-26 DIAGNOSIS — H168 Other keratitis: Secondary | ICD-10-CM | POA: Diagnosis not present

## 2018-12-29 DIAGNOSIS — H168 Other keratitis: Secondary | ICD-10-CM | POA: Diagnosis not present

## 2018-12-30 ENCOUNTER — Other Ambulatory Visit: Payer: Self-pay | Admitting: "Endocrinology

## 2018-12-31 DIAGNOSIS — E039 Hypothyroidism, unspecified: Secondary | ICD-10-CM | POA: Diagnosis not present

## 2018-12-31 DIAGNOSIS — Z79899 Other long term (current) drug therapy: Secondary | ICD-10-CM | POA: Diagnosis not present

## 2018-12-31 DIAGNOSIS — K219 Gastro-esophageal reflux disease without esophagitis: Secondary | ICD-10-CM | POA: Diagnosis not present

## 2018-12-31 DIAGNOSIS — Z20828 Contact with and (suspected) exposure to other viral communicable diseases: Secondary | ICD-10-CM | POA: Diagnosis not present

## 2018-12-31 DIAGNOSIS — M329 Systemic lupus erythematosus, unspecified: Secondary | ICD-10-CM | POA: Diagnosis not present

## 2018-12-31 DIAGNOSIS — H16062 Mycotic corneal ulcer, left eye: Secondary | ICD-10-CM | POA: Diagnosis not present

## 2018-12-31 DIAGNOSIS — Z7901 Long term (current) use of anticoagulants: Secondary | ICD-10-CM | POA: Diagnosis not present

## 2018-12-31 DIAGNOSIS — Z952 Presence of prosthetic heart valve: Secondary | ICD-10-CM | POA: Diagnosis not present

## 2018-12-31 DIAGNOSIS — C50912 Malignant neoplasm of unspecified site of left female breast: Secondary | ICD-10-CM | POA: Diagnosis not present

## 2018-12-31 DIAGNOSIS — H168 Other keratitis: Secondary | ICD-10-CM | POA: Diagnosis not present

## 2018-12-31 DIAGNOSIS — D591 Autoimmune hemolytic anemia, unspecified: Secondary | ICD-10-CM | POA: Diagnosis not present

## 2018-12-31 DIAGNOSIS — Z87891 Personal history of nicotine dependence: Secondary | ICD-10-CM | POA: Diagnosis not present

## 2019-01-05 ENCOUNTER — Ambulatory Visit (HOSPITAL_COMMUNITY): Payer: BC Managed Care – PPO

## 2019-01-07 DIAGNOSIS — G894 Chronic pain syndrome: Secondary | ICD-10-CM | POA: Diagnosis not present

## 2019-01-07 DIAGNOSIS — T868422 Corneal transplant infection, left eye: Secondary | ICD-10-CM | POA: Diagnosis not present

## 2019-01-08 ENCOUNTER — Other Ambulatory Visit (HOSPITAL_COMMUNITY)
Admission: RE | Admit: 2019-01-08 | Discharge: 2019-01-08 | Disposition: A | Discharge status: Home / Self Care | Payer: BC Managed Care – PPO | Source: Ambulatory Visit | Attending: Cardiology | Admitting: Cardiology

## 2019-01-08 ENCOUNTER — Other Ambulatory Visit: Payer: Self-pay | Admitting: "Endocrinology

## 2019-01-08 ENCOUNTER — Other Ambulatory Visit: Payer: Self-pay

## 2019-01-08 ENCOUNTER — Other Ambulatory Visit: Payer: Self-pay | Admitting: *Deleted

## 2019-01-08 ENCOUNTER — Ambulatory Visit (INDEPENDENT_AMBULATORY_CARE_PROVIDER_SITE_OTHER): Payer: BC Managed Care – PPO | Admitting: *Deleted

## 2019-01-08 DIAGNOSIS — D6859 Other primary thrombophilia: Secondary | ICD-10-CM

## 2019-01-08 DIAGNOSIS — E039 Hypothyroidism, unspecified: Secondary | ICD-10-CM

## 2019-01-08 DIAGNOSIS — Z5181 Encounter for therapeutic drug level monitoring: Secondary | ICD-10-CM

## 2019-01-08 DIAGNOSIS — Z952 Presence of prosthetic heart valve: Secondary | ICD-10-CM | POA: Diagnosis not present

## 2019-01-08 DIAGNOSIS — Z7901 Long term (current) use of anticoagulants: Secondary | ICD-10-CM

## 2019-01-08 LAB — PROTIME-INR
INR: >10 (ref 0.8–1.2)
Prothrombin Time: >90 seconds — ABNORMAL HIGH (ref 11.4–15.2)

## 2019-01-08 LAB — POCT INR: INR: 8 — AB (ref 2.0–3.0)

## 2019-01-08 NOTE — Patient Instructions (Signed)
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  Was sent to [facility] after being treated locally for bacterial infection with Doxycycline.  Antifungal oral meds and 4+ eye drops were given.  She has finished oral meds now.  She did require cornea transplant at [facility] last week.  Pt denies any S/S of bleeding or bruising.  Bleeding and fall precautions discussed with pt and she verbalized understanding.  She is to report to the ED if any bleeding develops or if she has any falls.  Discussed case with Dr Domenic Polite.  We will allow INR to drift down on its own since pt has a Mechanical MV.  Pt was told to hold coumadin and come for INR check Monday morning 01/12/19.  She has f/u at [facility] Monday afternoon.  Pt is in agreement with plan of care.

## 2019-01-09 LAB — TSH: TSH: 1.26 mIU/L

## 2019-01-09 LAB — T4, FREE: Free T4: 1.9 ng/dL — ABNORMAL HIGH (ref 0.8–1.8)

## 2019-01-12 ENCOUNTER — Other Ambulatory Visit: Payer: Self-pay

## 2019-01-12 ENCOUNTER — Ambulatory Visit (INDEPENDENT_AMBULATORY_CARE_PROVIDER_SITE_OTHER): Payer: BC Managed Care – PPO | Admitting: *Deleted

## 2019-01-12 DIAGNOSIS — Z952 Presence of prosthetic heart valve: Secondary | ICD-10-CM | POA: Diagnosis not present

## 2019-01-12 DIAGNOSIS — D6859 Other primary thrombophilia: Secondary | ICD-10-CM

## 2019-01-12 DIAGNOSIS — Z5181 Encounter for therapeutic drug level monitoring: Secondary | ICD-10-CM | POA: Diagnosis not present

## 2019-01-12 LAB — POCT INR: INR: 1.7 — AB (ref 2.0–3.0)

## 2019-01-12 MED ORDER — WARFARIN SODIUM 5 MG PO TABS: ORAL_TABLET | ORAL | 3 refills | Status: DC

## 2019-01-12 NOTE — Patient Instructions (Signed)
Take warfarin 2 tablets tonight then resume 1 1/2 tablets daily except 1 tablet on Mondays and Thursdays. Has follow up at [facility] today for cornea transplant. Recheck in 1 week

## 2019-01-14 ENCOUNTER — Ambulatory Visit (HOSPITAL_COMMUNITY)
Admission: RE | Admit: 2019-01-14 | Discharge: 2019-01-14 | Disposition: A | Discharge status: Home / Self Care | Payer: BC Managed Care – PPO | Source: Ambulatory Visit | Attending: Hematology | Admitting: Hematology

## 2019-01-14 ENCOUNTER — Encounter: Payer: Self-pay | Admitting: "Endocrinology

## 2019-01-14 ENCOUNTER — Other Ambulatory Visit: Payer: Self-pay

## 2019-01-14 ENCOUNTER — Ambulatory Visit (INDEPENDENT_AMBULATORY_CARE_PROVIDER_SITE_OTHER): Payer: Medicare Other | Admitting: "Endocrinology

## 2019-01-14 DIAGNOSIS — E039 Hypothyroidism, unspecified: Secondary | ICD-10-CM

## 2019-01-14 DIAGNOSIS — C50412 Malignant neoplasm of upper-outer quadrant of left female breast: Secondary | ICD-10-CM

## 2019-01-14 DIAGNOSIS — Z171 Estrogen receptor negative status [ER-]: Secondary | ICD-10-CM | POA: Diagnosis not present

## 2019-01-14 DIAGNOSIS — Z1231 Encounter for screening mammogram for malignant neoplasm of breast: Secondary | ICD-10-CM | POA: Insufficient documentation

## 2019-01-14 MED ORDER — SYNTHROID 75 MCG PO TABS: 75.0000 ug | ORAL_TABLET | Freq: Every day | ORAL | 6 refills | Status: DC

## 2019-01-14 NOTE — Progress Notes (Signed)
01/14/2019                                Endocrinology Telehealth Visit Follow up Note -During COVID -19 Pandemic  I connected with [name] on 01/14/2019   by telephone and verified that I am speaking with the correct person using two identifiers. [name], 06/20/1970. she has verbally consented to this visit. All issues noted in this document were discussed and addressed. The format was not optimal for physical exam.  Past Medical History:  Diagnosis Date  . Abnormal Papanicolaou smear of cervix with positive human papilloma virus (HPV) test 08/17/2015  . Abnormality of heart beat   . Anticoagulation goal of INR 2.5 to 3.5   . Anxiety   . B12 deficiency   . BRCA1 negative   . BRCA2 negative   . Breast cancer, left breast ([place]) 2011   S/P mastectomy; chemo; "no radiation due to lupus"  . Bursitis of right knee    Septic bursitis  . Chronic lower back pain   . Drug-induced hepatitis    States per her rheumatologist, transaminases elevated but normalized after drug removed for   . Fibromyalgia   . GERD (gastroesophageal reflux disease)   . 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 History of abnormal cervical Pap smear 08/12/2015  . History of blood transfusion   . HSIL (high grade squamous intraepithelial lesion) on Pap smear of cervix 01/14/2017   colpo with Dr Elonda Husky   . Hypothyroidism   . Mitral valve disease, rheumatic    St. Jude prosthesis  . Peripheral neuropathy   . Pneumonia   . Raynaud phenomenon   . Rheumatoid arthritis ([place])   . SLE (systemic lupus erythematosus) ([place])    Past Surgical History:  Procedure Laterality Date  . APPENDECTOMY N/A 05/23/2016   Procedure: OPEN APPENDECTOMY WITH DRAINAGE OF ABSCESS;  Surgeon: Fanny Skates, MD;  Location: [facility];  Service: General;  Laterality: N/A;  . APPENDECTOMY    . BREAST BIOPSY Left 2012  . BREAST IMPLANT EXCHANGE Left 12/2015  . CARDIAC CATHETERIZATION  2008  . CARDIAC  VALVE REPLACEMENT    . CERVICAL CONIZATION W/BX N/A 03/06/2017   Procedure: Laser Conization of Cervix;  Surgeon: Florian Buff, MD;  Location: [facility];  Service: Gynecology;  Laterality: N/A;  . ENDOMETRIAL ABLATION  2008   She no longer has menses  . ESOPHAGOGASTRODUODENOSCOPY N/A 03/25/2013   Dr. Raliegh Scarlet reflux esophagitis-likely source of patient's symptoms (patulous EG junction). Hiatal hernia otherwise normal  . LAPAROSCOPIC CHOLECYSTECTOMY    . LIVER BIOPSY  11/28/2016   liver core/notes 11/28/2016  . MASTECTOMY Left 2012  . MASTOPEXY  02/14/2011   Procedure: MASTOPEXY;  Surgeon: Macon Large;  Location: [facility];  Service: Plastics;  Laterality: Bilateral;  Right Breast Reduction   . MITRAL VALVE REPLACEMENT  2008  . TISSUE EXPANDER PLACEMENT  02/14/2011   Procedure: TISSUE EXPANDER;  Surgeon: Macon Large;  Location: [facility];  Service: Plastics;  Laterality: Bilateral;  Left Breast Remove Tissue Expander Placement of Implant Breast Reconstruction  . TISSUE EXPANDER PLACEMENT Left 11/16/2010   breasst  . TUBAL LIGATION       Follow-up for hypothyroidism   [name] is a 48 y.o.-year-old female, here to f/u for hypothyroidism. She is currently on levothyroxine 88 mcg p.o. daily before breakfast.  She is compliant to her  medications.  She continued to lose weight since last visit mainly as a complication of her illnesses.    She has no new complaints.  Denies heat/cold intolerance.  Denies palpitations, tremors.    ROS: Limited as above.  PE: There were no vitals taken for this visit. Wt Readings from Last 3 Encounters:  12/18/18 162 lb 6.4 oz (73.7 kg)  12/04/18 162 lb (73.5 kg)  10/08/18 157 lb (71.2 kg)     Recent Results (from the past 2160 hour(s))  CBC with Differential/Platelet     Status: None   Collection Time: 11/25/18 10:58 AM  Result Value Ref Range   WBC 4.9 4.0 - 10.5 K/uL   RBC 4.11 3.87 - 5.11 MIL/uL   Hemoglobin 12.4 12.0 - 15.0 g/dL    HCT 38.0 36.0 - 46.0 %   MCV 92.5 80.0 - 100.0 fL   MCH 30.2 26.0 - 34.0 pg   MCHC 32.6 30.0 - 36.0 g/dL   RDW 13.2 11.5 - 15.5 %   Platelets 218 150 - 400 K/uL   nRBC 0.0 0.0 - 0.2 %   Neutrophils Relative % 69 %   Neutro Abs 3.4 1.7 - 7.7 K/uL   Lymphocytes Relative 16 %   Lymphs Abs 0.8 0.7 - 4.0 K/uL   Monocytes Relative 9 %   Monocytes Absolute 0.4 0.1 - 1.0 K/uL   Eosinophils Relative 5 %   Eosinophils Absolute 0.2 0.0 - 0.5 K/uL   Basophils Relative 1 %   Basophils Absolute 0.0 0.0 - 0.1 K/uL   Immature Granulocytes 0 %   Abs Immature Granulocytes 0.02 0.00 - 0.07 K/uL    Comment: Performed at [facility], [address], [place], [place] [address]  Comprehensive metabolic panel     Status: None   Collection Time: 11/25/18 10:58 AM  Result Value Ref Range   Sodium 140 135 - 145 mmol/L   Potassium 4.3 3.5 - 5.1 mmol/L   Chloride 107 98 - 111 mmol/L   CO2 25 22 - 32 mmol/L   Glucose, Bld 88 70 - 99 mg/dL   BUN 10 6 - 20 mg/dL   Creatinine, Ser 0.60 0.44 - 1.00 mg/dL   Calcium 9.1 8.9 - 10.3 mg/dL   Total Protein 7.5 6.5 - 8.1 g/dL   Albumin 4.0 3.5 - 5.0 g/dL   AST 22 15 - 41 U/L   ALT 16 0 - 44 U/L   Alkaline Phosphatase 80 38 - 126 U/L   Total Bilirubin 0.6 0.3 - 1.2 mg/dL   GFR calc non Af Amer >60 >60 mL/min   GFR calc Af Amer >60 >60 mL/min   Anion gap 8 5 - 15    Comment: Performed at [facility], [address] [place], [place] [address]  POCT INR     Status: Normal   Collection Time: 11/27/18  8:02 AM  Result Value Ref Range   INR 2.5 2.0 - 3.0  Cytology - PAP( [place])     Status: Abnormal   Collection Time: 12/04/18  2:55 PM  Result Value Ref Range   High risk HPV Positive (A)    Neisseria Gonorrhea Negative    Chlamydia Negative    HPV 16 Negative    HPV 18 / 45 Negative    Adequacy      Satisfactory for evaluation; transformation zone component PRESENT.   Diagnosis      - Negative for intraepithelial lesion or malignancy (NILM)   Comment  Normal Reference Range HPV - Negative    Comment Normal Reference [place] Chlamydia - Negative    Comment      Normal Reference Range Neisseria Gonorrhea - Negative   Comment Normal Reference Range HPV 16 18 45 -Negative    Comment Normal Reference Range HPV 16- Negative   POCT INR     Status: Abnormal   Collection Time: 01/08/19  9:32 AM  Result Value Ref Range   INR 8.0 (A) 2.0 - 3.0  INR/PT     Status: None   Collection Time: 01/08/19 10:06 AM  Result Value Ref Range   INR CANCELED     Comment: TEST NOT PERFORMED . Specimen received clotted.  Result canceled by the ancillary.   TSH     Status: None   Collection Time: 01/08/19 10:35 AM  Result Value Ref Range   TSH 1.26 mIU/L    Comment:           Reference Range .           > or = 20 Years  0.40-4.50 .                Pregnancy Ranges           First trimester    0.26-2.66           Second trimester   0.55-2.73           Third trimester    0.43-2.91   T4, Free     Status: Abnormal   Collection Time: 01/08/19 10:35 AM  Result Value Ref Range   Free T4 1.9 (H) 0.8 - 1.8 ng/dL  Protime-INR     Status: Abnormal   Collection Time: 01/08/19  3:12 PM  Result Value Ref Range   Prothrombin Time >90.0 (H) 11.4 - 15.2 seconds   INR >10.0 (HH) 0.8 - 1.2    Comment: REPEATED TO VERIFY CRITICAL RESULT CALLED TO, READ BACK BY AND VERIFIED WITH: LISA REID AT 1550 BY HFLYNT 01/08/19 (NOTE) INR goal varies based on device and disease states. Performed at [facility], [address] [place], [place] [address]   POCT INR     Status: Abnormal   Collection Time: 01/12/19  7:47 AM  Result Value Ref Range   INR 1.7 (A) 2.0 - 3.0     ASSESSMENT: 1. Hypothyroidism  PLAN:   Her thyroid function tests are consistent with slight over replacement.  I discussed and lowered her synthyroid to 75 mcg p.o. daily before breakfast.     - We discussed about the correct intake of her thyroid hormone, on empty stomach at fasting, with water,  separated by at least 30 minutes from breakfast and other medications,  and separated by more than 4 hours from calcium, iron, multivitamins, acid reflux medications (PPIs). -Patient is made aware of the fact that thyroid hormone replacement is needed for life, dose to be adjusted by periodic monitoring of thyroid function tests.   Time for this visit: 15 minutes. Latrelle Dodrill [place]  participated in the discussions, expressed understanding, and voiced agreement with the above plans.  All questions were answered to her satisfaction. she is encouraged to contact clinic should she have any questions or concerns prior to her return visit.   Glade Lloyd, MD Phone: [phone]  Fax: [phone]  -  This note was partially dictated with voice recognition software. Similar sounding words can be transcribed inadequately or may not  be corrected upon review.  01/14/2019, 10:44  AM

## 2019-01-19 ENCOUNTER — Ambulatory Visit (INDEPENDENT_AMBULATORY_CARE_PROVIDER_SITE_OTHER): Payer: BC Managed Care – PPO | Admitting: *Deleted

## 2019-01-19 ENCOUNTER — Other Ambulatory Visit: Payer: Self-pay

## 2019-01-19 DIAGNOSIS — Z952 Presence of prosthetic heart valve: Secondary | ICD-10-CM | POA: Diagnosis not present

## 2019-01-19 DIAGNOSIS — D6859 Other primary thrombophilia: Secondary | ICD-10-CM

## 2019-01-19 DIAGNOSIS — Z5181 Encounter for therapeutic drug level monitoring: Secondary | ICD-10-CM

## 2019-01-19 LAB — POCT INR: INR: 3.3 — AB (ref 2.0–3.0)

## 2019-01-19 NOTE — Patient Instructions (Signed)
Continue 1 1/2 tablets daily except 1 tablet on Mondays and Thursdays. Recheck in 2 weeks

## 2019-02-02 ENCOUNTER — Other Ambulatory Visit: Payer: Self-pay | Admitting: Gastroenterology

## 2019-02-03 ENCOUNTER — Other Ambulatory Visit: Payer: Self-pay

## 2019-02-03 ENCOUNTER — Ambulatory Visit (INDEPENDENT_AMBULATORY_CARE_PROVIDER_SITE_OTHER): Payer: BC Managed Care – PPO | Admitting: *Deleted

## 2019-02-03 DIAGNOSIS — D6859 Other primary thrombophilia: Secondary | ICD-10-CM | POA: Diagnosis not present

## 2019-02-03 DIAGNOSIS — M329 Systemic lupus erythematosus, unspecified: Secondary | ICD-10-CM | POA: Diagnosis not present

## 2019-02-03 DIAGNOSIS — Z79899 Other long term (current) drug therapy: Secondary | ICD-10-CM | POA: Diagnosis not present

## 2019-02-03 DIAGNOSIS — Z952 Presence of prosthetic heart valve: Secondary | ICD-10-CM

## 2019-02-03 DIAGNOSIS — M0609 Rheumatoid arthritis without rheumatoid factor, multiple sites: Secondary | ICD-10-CM | POA: Diagnosis not present

## 2019-02-03 DIAGNOSIS — Z5181 Encounter for therapeutic drug level monitoring: Secondary | ICD-10-CM | POA: Diagnosis not present

## 2019-02-03 DIAGNOSIS — M2550 Pain in unspecified joint: Secondary | ICD-10-CM | POA: Diagnosis not present

## 2019-02-03 LAB — POCT INR: INR: 4.7 — AB (ref 2.0–3.0)

## 2019-02-03 NOTE — Patient Instructions (Signed)
Still on Antibiotic eye drop 4 x daily Hold coumadin tonight then decrease dose to 1 tablet daily except 1/2 tablet on Tuesdays, Thursdays and Saturdays Recheck in 2 weeks

## 2019-02-09 DIAGNOSIS — E559 Vitamin D deficiency, unspecified: Secondary | ICD-10-CM | POA: Diagnosis not present

## 2019-02-09 DIAGNOSIS — R5383 Other fatigue: Secondary | ICD-10-CM | POA: Diagnosis not present

## 2019-02-09 DIAGNOSIS — G894 Chronic pain syndrome: Secondary | ICD-10-CM | POA: Diagnosis not present

## 2019-02-09 DIAGNOSIS — E538 Deficiency of other specified B group vitamins: Secondary | ICD-10-CM | POA: Diagnosis not present

## 2019-02-10 DIAGNOSIS — R5383 Other fatigue: Secondary | ICD-10-CM | POA: Diagnosis not present

## 2019-02-10 DIAGNOSIS — G894 Chronic pain syndrome: Secondary | ICD-10-CM | POA: Diagnosis not present

## 2019-02-10 DIAGNOSIS — E538 Deficiency of other specified B group vitamins: Secondary | ICD-10-CM | POA: Diagnosis not present

## 2019-02-10 DIAGNOSIS — E559 Vitamin D deficiency, unspecified: Secondary | ICD-10-CM | POA: Diagnosis not present

## 2019-02-17 ENCOUNTER — Other Ambulatory Visit: Payer: Self-pay

## 2019-02-17 ENCOUNTER — Ambulatory Visit (INDEPENDENT_AMBULATORY_CARE_PROVIDER_SITE_OTHER): Payer: BC Managed Care – PPO | Admitting: *Deleted

## 2019-02-17 DIAGNOSIS — D6859 Other primary thrombophilia: Secondary | ICD-10-CM

## 2019-02-17 DIAGNOSIS — Z952 Presence of prosthetic heart valve: Secondary | ICD-10-CM

## 2019-02-17 DIAGNOSIS — Z5181 Encounter for therapeutic drug level monitoring: Secondary | ICD-10-CM | POA: Diagnosis not present

## 2019-02-17 LAB — POCT INR: INR: 3.4 — AB (ref 2.0–3.0)

## 2019-02-17 NOTE — Patient Instructions (Signed)
Still on Antibiotic eye drop 4 x daily.  Has f/u eye appt tomorrow Continue warfarin 1 tablet daily except 1/2 tablet on Tuesdays, Thursdays and Saturdays Recheck in 3 weeks

## 2019-03-10 ENCOUNTER — Other Ambulatory Visit: Payer: Self-pay

## 2019-03-10 ENCOUNTER — Ambulatory Visit: Payer: Medicare Other | Attending: Internal Medicine

## 2019-03-10 DIAGNOSIS — Z20822 Contact with and (suspected) exposure to covid-19: Secondary | ICD-10-CM | POA: Diagnosis not present

## 2019-03-11 DIAGNOSIS — G894 Chronic pain syndrome: Secondary | ICD-10-CM | POA: Diagnosis not present

## 2019-03-11 LAB — NOVEL CORONAVIRUS, NAA: SARS-CoV-2, NAA: NOT DETECTED

## 2019-03-24 ENCOUNTER — Other Ambulatory Visit: Payer: Self-pay

## 2019-03-24 ENCOUNTER — Ambulatory Visit (INDEPENDENT_AMBULATORY_CARE_PROVIDER_SITE_OTHER): Payer: BC Managed Care – PPO | Admitting: *Deleted

## 2019-03-24 DIAGNOSIS — D6859 Other primary thrombophilia: Secondary | ICD-10-CM | POA: Diagnosis not present

## 2019-03-24 DIAGNOSIS — Z5181 Encounter for therapeutic drug level monitoring: Secondary | ICD-10-CM | POA: Diagnosis not present

## 2019-03-24 DIAGNOSIS — Z952 Presence of prosthetic heart valve: Secondary | ICD-10-CM | POA: Diagnosis not present

## 2019-03-24 LAB — POCT INR: INR: 2.3 (ref 2.0–3.0)

## 2019-03-24 NOTE — Patient Instructions (Signed)
Started back on steroid eye drop Lt eye twice daily Take warfarin 2 tablets tonight then resume warfarin 1 tablet daily except 1 1/2 tablets on Tuesdays, Thursdays and Saturdays Recheck in 3 weeks

## 2019-04-02 NOTE — Progress Notes (Signed)
Upon putting orders in for tomorrows infusion, I discovered that order was for [facility], rather than the Rituxin she has received previously, due to [facility] requirements for 2021.  Unfortunately, this drug is not on the [facility] Formulary.  Office notified and will speak with patient about making other arrangements for infusion.

## 2019-04-03 ENCOUNTER — Encounter (HOSPITAL_COMMUNITY): Admission: RE | Admit: 2019-04-03 | Payer: BC Managed Care – PPO | Source: Ambulatory Visit

## 2019-04-06 DIAGNOSIS — Z947 Corneal transplant status: Secondary | ICD-10-CM | POA: Diagnosis not present

## 2019-04-14 ENCOUNTER — Ambulatory Visit (INDEPENDENT_AMBULATORY_CARE_PROVIDER_SITE_OTHER): Payer: BC Managed Care – PPO | Admitting: *Deleted

## 2019-04-14 ENCOUNTER — Other Ambulatory Visit: Payer: Self-pay

## 2019-04-14 DIAGNOSIS — D6859 Other primary thrombophilia: Secondary | ICD-10-CM | POA: Diagnosis not present

## 2019-04-14 DIAGNOSIS — Z952 Presence of prosthetic heart valve: Secondary | ICD-10-CM

## 2019-04-14 DIAGNOSIS — Z5181 Encounter for therapeutic drug level monitoring: Secondary | ICD-10-CM | POA: Diagnosis not present

## 2019-04-14 LAB — POCT INR: INR: 2.8 (ref 2.0–3.0)

## 2019-04-14 NOTE — Patient Instructions (Signed)
Still on steroid eye drop Lt eye twice daily Continue warfarin 1 tablet daily except 1 1/2 tablets on Tuesdays, Thursdays and Saturdays Recheck in 4 weeks

## 2019-04-24 DIAGNOSIS — G894 Chronic pain syndrome: Secondary | ICD-10-CM | POA: Diagnosis not present

## 2019-05-07 DIAGNOSIS — G8929 Other chronic pain: Secondary | ICD-10-CM | POA: Diagnosis not present

## 2019-05-07 DIAGNOSIS — J069 Acute upper respiratory infection, unspecified: Secondary | ICD-10-CM | POA: Diagnosis not present

## 2019-05-12 ENCOUNTER — Other Ambulatory Visit: Payer: Self-pay

## 2019-05-12 ENCOUNTER — Ambulatory Visit (INDEPENDENT_AMBULATORY_CARE_PROVIDER_SITE_OTHER): Payer: BC Managed Care – PPO | Admitting: *Deleted

## 2019-05-12 DIAGNOSIS — D6859 Other primary thrombophilia: Secondary | ICD-10-CM

## 2019-05-12 DIAGNOSIS — Z952 Presence of prosthetic heart valve: Secondary | ICD-10-CM

## 2019-05-12 DIAGNOSIS — Z5181 Encounter for therapeutic drug level monitoring: Secondary | ICD-10-CM | POA: Diagnosis not present

## 2019-05-12 LAB — POCT INR: INR: 3.1 — AB (ref 2.0–3.0)

## 2019-05-12 NOTE — Patient Instructions (Signed)
Still on steroid eye drop Lt eye twice daily Continue warfarin 1 tablet daily except 1 1/2 tablets on Tuesdays, Thursdays and Saturdays Recheck in 4 weeks

## 2019-05-19 ENCOUNTER — Other Ambulatory Visit: Payer: Self-pay | Admitting: Gastroenterology

## 2019-05-19 DIAGNOSIS — M0609 Rheumatoid arthritis without rheumatoid factor, multiple sites: Secondary | ICD-10-CM | POA: Diagnosis not present

## 2019-05-26 DIAGNOSIS — J45909 Unspecified asthma, uncomplicated: Secondary | ICD-10-CM | POA: Diagnosis not present

## 2019-06-02 ENCOUNTER — Ambulatory Visit: Payer: BC Managed Care – PPO | Admitting: Obstetrics & Gynecology

## 2019-06-02 DIAGNOSIS — Z0189 Encounter for other specified special examinations: Secondary | ICD-10-CM | POA: Diagnosis not present

## 2019-06-08 ENCOUNTER — Encounter: Payer: Self-pay | Admitting: Obstetrics & Gynecology

## 2019-06-08 ENCOUNTER — Other Ambulatory Visit: Payer: Self-pay

## 2019-06-08 ENCOUNTER — Other Ambulatory Visit (HOSPITAL_COMMUNITY)
Admission: RE | Admit: 2019-06-08 | Discharge: 2019-06-08 | Disposition: A | Discharge status: Home / Self Care | Payer: BC Managed Care – PPO | Source: Ambulatory Visit | Attending: Obstetrics & Gynecology | Admitting: Obstetrics & Gynecology

## 2019-06-08 ENCOUNTER — Ambulatory Visit (INDEPENDENT_AMBULATORY_CARE_PROVIDER_SITE_OTHER): Payer: Medicare Other | Admitting: Obstetrics & Gynecology

## 2019-06-08 DIAGNOSIS — Z124 Encounter for screening for malignant neoplasm of cervix: Secondary | ICD-10-CM | POA: Diagnosis not present

## 2019-06-08 DIAGNOSIS — Z1151 Encounter for screening for human papillomavirus (HPV): Secondary | ICD-10-CM | POA: Insufficient documentation

## 2019-06-08 DIAGNOSIS — Z1272 Encounter for screening for malignant neoplasm of vagina: Secondary | ICD-10-CM | POA: Diagnosis not present

## 2019-06-08 DIAGNOSIS — Z9889 Other specified postprocedural states: Secondary | ICD-10-CM

## 2019-06-08 NOTE — Progress Notes (Signed)
Chief Complaint  Patient presents with  . 6 month Pap Smear      49 y.o. [id number] No LMP recorded. Patient has had an ablation. The current method of family planning is tubal ligation and endometrial ablation.  Outpatient Encounter Medications as of 06/08/2019  Medication Sig Note  . albuterol (VENTOLIN HFA) 108 (90 Base) MCG/ACT inhaler SMARTSIG:1-2 Puff(s) Via Inhaler Daily   . cyanocobalamin (,VITAMIN B-12,) 1000 MCG/ML injection Inject 1 mL (1,000 mcg total) into the muscle every 30 (thirty) days. 11/29/2016: 3rd of every month  . DEXILANT 60 MG capsule TAKE ONE CAPSULE BY MOUTH DAILY.   . famotidine (PEPCID) 40 MG tablet TAKE (1) TABLET BY MOUTH AT BEDTIME.   . furosemide (LASIX) 40 MG tablet TAKE ONE TABLET BY MOUTH ONCE DAILY.   Marland Kitchen ibuprofen (ADVIL,MOTRIN) 800 MG tablet Take 800 mg by mouth 2 (two) times daily as needed for mild pain or moderate pain. Reported on 08/30/2015   . metoCLOPramide (REGLAN) 5 MG tablet TAKE (1) TABLET BY MOUTH TWICE DAILY.   Marland Kitchen ondansetron (ZOFRAN) 4 MG tablet Take 1 tablet (4 mg total) by mouth every 8 (eight) hours as needed for nausea or vomiting.   Marland Kitchen oxyCODONE-acetaminophen (PERCOCET/ROXICET) 5-325 MG tablet Take 1-2 tablets by mouth every 4 (four) hours as needed for severe pain.   . potassium chloride (KLOR-CON 10) 10 MEQ tablet Take 10 mEq by mouth every evening. Take one tablet daily as directed   . promethazine (PHENERGAN) 12.5 MG tablet Take 1 tablet (12.5 mg total) by mouth every 6 (six) hours as needed for nausea or vomiting.   . riTUXimab (RITUXAN) 100 MG/10ML injection Inject into the vein. Every 4 months 11/29/2016: Per pt next dose due 12/18/16  . rOPINIRole (REQUIP) 1 MG tablet Take 1 mg by mouth at bedtime.   Marland Kitchen SYNTHROID 75 MCG tablet Take 1 tablet (75 mcg total) by mouth daily before breakfast.   . warfarin (COUMADIN) 5 MG tablet TAKE 1 & 1/2 TABLETS BY MOUTH DAILY OR AS DIRECTED   . zolpidem (AMBIEN) 10 MG tablet Take 10 mg by  mouth at bedtime.     No facility-administered encounter medications on file as of 06/08/2019.    Subjective Pt here for follow up Pap/HPV She had laser conization of the cervix 02/2017 No other complaints at present Past Medical History:  Diagnosis Date  . Abnormal Papanicolaou smear of cervix with positive human papilloma virus (HPV) test 08/17/2015  . Abnormality of heart beat   . Anticoagulation goal of INR 2.5 to 3.5   . Anxiety   . B12 deficiency   . BRCA1 negative   . BRCA2 negative   . Breast cancer, left breast ([place]) 2011   S/P mastectomy; chemo; "no radiation due to lupus"  . Bursitis of right knee    Septic bursitis  . Chronic lower back pain   . Drug-induced hepatitis    States per her rheumatologist, transaminases elevated but normalized after drug removed for   . Fibromyalgia   . GERD (gastroesophageal reflux disease)   . 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 History of abnormal cervical Pap smear 08/12/2015  . History of blood transfusion   . HSIL (high grade squamous intraepithelial lesion) on Pap smear of cervix 01/14/2017   colpo with Dr Elonda Husky   . Hypothyroidism   . Mitral valve disease, rheumatic    St. Jude prosthesis  . Peripheral neuropathy   . Pneumonia   .  Raynaud phenomenon   . Rheumatoid arthritis ([place])   . SLE (systemic lupus erythematosus) ([place])     Past Surgical History:  Procedure Laterality Date  . APPENDECTOMY N/A 05/23/2016   Procedure: OPEN APPENDECTOMY WITH DRAINAGE OF ABSCESS;  Surgeon: Fanny Skates, MD;  Location: [facility];  Service: General;  Laterality: N/A;  . APPENDECTOMY    . BREAST BIOPSY Left 2012  . BREAST IMPLANT EXCHANGE Left 12/2015  . CARDIAC CATHETERIZATION  2008  . CARDIAC VALVE REPLACEMENT    . CERVICAL CONIZATION W/BX N/A 03/06/2017   Procedure: Laser Conization of Cervix;  Surgeon: Florian Buff, MD;  Location: [facility];  Service: Gynecology;  Laterality: N/A;  . ENDOMETRIAL ABLATION   2008   She no longer has menses  . ESOPHAGOGASTRODUODENOSCOPY N/A 03/25/2013   Dr. Raliegh Scarlet reflux esophagitis-likely source of patient's symptoms (patulous EG junction). Hiatal hernia otherwise normal  . LAPAROSCOPIC CHOLECYSTECTOMY    . LIVER BIOPSY  11/28/2016   liver core/notes 11/28/2016  . MASTECTOMY Left 2012  . MASTOPEXY  02/14/2011   Procedure: MASTOPEXY;  Surgeon: Macon Large;  Location: [facility];  Service: Plastics;  Laterality: Bilateral;  Right Breast Reduction   . MITRAL VALVE REPLACEMENT  2008  . TISSUE EXPANDER PLACEMENT  02/14/2011   Procedure: TISSUE EXPANDER;  Surgeon: Macon Large;  Location: [facility];  Service: Plastics;  Laterality: Bilateral;  Left Breast Remove Tissue Expander Placement of Implant Breast Reconstruction  . TISSUE EXPANDER PLACEMENT Left 11/16/2010   breasst  . TUBAL LIGATION      OB History    Gravida  2   Para  2   Term  0   Preterm      AB      Living  2     SAB      TAB      Ectopic      Multiple      Live Births  2           Allergies  Allergen Reactions  . Methotrexate Derivatives Other (See Comments)    Raised liver enzymes   . 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 kph    Social History   Socioeconomic History  . Marital status: Married    Spouse name: Not on file  . Number of children: 2  . Years of education: Not on file  . Highest education level: Not on file  Occupational History  . Not on file  Tobacco Use  . Smoking status: Former Smoker    Packs/day: 1.00    Years: 15.00    Pack years: 15.00    Types: Cigarettes    Quit date: 02/19/2006    Years since quitting: 13.3  . Smokeless tobacco: Never Used  Substance and Sexual Activity  . Alcohol use: Not Currently    Alcohol/week: 0.0 standard drinks    Comment: 11/28/2016 "couple drinks/yearZ"  . Drug use: No  . Sexual activity: Yes    Birth  control/protection: Surgical    Comment: tubal and ablation  Other Topics Concern  . Not on file  Social History Narrative  . Not on file   Social Determinants of Health   Financial Resource Strain:   . Difficulty of Paying Living Expenses:   Food Insecurity:   . Worried About Charity fundraiser in the Last Year:   .  Ran Out of Food in the Last Year:   Transportation Needs:   . Film/video editor (Medical):   Marland Kitchen Lack of Transportation (Non-Medical):   Physical Activity:   . Days of Exercise per Week:   . Minutes of Exercise per Session:   Stress:   . Feeling of Stress :   Social Connections:   . Frequency of Communication with Friends and Family:   . Frequency of Social Gatherings with Friends and Family:   . Attends Religious Services:   . Active Member of Clubs or Organizations:   . Attends Archivist Meetings:   Marland Kitchen Marital Status:     Family History  Problem Relation Age of Onset  . Hypertension Mother   . Hyperlipidemia Mother   . Hypertension Father   . Colon cancer Neg Hx   . Colon polyps Neg Hx     Medications:       Current Outpatient Medications:  .  albuterol (VENTOLIN HFA) 108 (90 Base) MCG/ACT inhaler, SMARTSIG:1-2 Puff(s) Via Inhaler Daily, Disp: , Rfl:  .  cyanocobalamin (,VITAMIN B-12,) 1000 MCG/ML injection, Inject 1 mL (1,000 mcg total) into the muscle every 30 (thirty) days., Disp: 10 mL, Rfl: prn .  DEXILANT 60 MG capsule, TAKE ONE CAPSULE BY MOUTH DAILY., Disp: 90 capsule, Rfl: 3 .  famotidine (PEPCID) 40 MG tablet, TAKE (1) TABLET BY MOUTH AT BEDTIME., Disp: 90 tablet, Rfl: 1 .  furosemide (LASIX) 40 MG tablet, TAKE ONE TABLET BY MOUTH ONCE DAILY., Disp: 90 tablet, Rfl: 1 .  ibuprofen (ADVIL,MOTRIN) 800 MG tablet, Take 800 mg by mouth 2 (two) times daily as needed for mild pain or moderate pain. Reported on 08/30/2015, Disp: , Rfl:  .  metoCLOPramide (REGLAN) 5 MG tablet, TAKE (1) TABLET BY MOUTH TWICE DAILY., Disp: 180 tablet, Rfl:  2 .  ondansetron (ZOFRAN) 4 MG tablet, Take 1 tablet (4 mg total) by mouth every 8 (eight) hours as needed for nausea or vomiting., Disp: 60 tablet, Rfl: 11 .  oxyCODONE-acetaminophen (PERCOCET/ROXICET) 5-325 MG tablet, Take 1-2 tablets by mouth every 4 (four) hours as needed for severe pain., Disp: 30 tablet, Rfl: 0 .  potassium chloride (KLOR-CON 10) 10 MEQ tablet, Take 10 mEq by mouth every evening. Take one tablet daily as directed, Disp: , Rfl:  .  promethazine (PHENERGAN) 12.5 MG tablet, Take 1 tablet (12.5 mg total) by mouth every 6 (six) hours as needed for nausea or vomiting., Disp: 60 tablet, Rfl: 11 .  riTUXimab (RITUXAN) 100 MG/10ML injection, Inject into the vein. Every 4 months, Disp: , Rfl:  .  rOPINIRole (REQUIP) 1 MG tablet, Take 1 mg by mouth at bedtime., Disp: , Rfl:  .  SYNTHROID 75 MCG tablet, Take 1 tablet (75 mcg total) by mouth daily before breakfast., Disp: 30 tablet, Rfl: 6 .  warfarin (COUMADIN) 5 MG tablet, TAKE 1 & 1/2 TABLETS BY MOUTH DAILY OR AS DIRECTED, Disp: 135 tablet, Rfl: 3 .  zolpidem (AMBIEN) 10 MG tablet, Take 10 mg by mouth at bedtime. , Disp: , Rfl:   Objective There were no vitals taken for this visit.  General WDWN female NAD Vulva:  normal appearing vulva with no masses, tenderness or lesions Vagina:  normal mucosa, no discharge Cervix:  Normal no lesions Uterus:  normal size, contour, position, consistency, mobility, non-tender Adnexa: ovaries:present,  normal adnexa in size, nontender and no masses   Pertinent ROS No burning with urination, frequency or urgency No nausea, vomiting or diarrhea  Nor fever chills or other constitutional symptoms   Labs or studies     Impression Diagnoses this Encounter::   ICD-10-CM   1. S/P cone biopsy of cervix  Z98.890   2. Routine cervical smear  Z12.4 Cytology - PAP( [facility])    Established relevant diagnosis(es):   Plan/Recommendations: No orders of the defined types were placed in this  encounter.   Labs or Scans Ordered: No orders of the defined types were placed in this encounter.   Management:: >Repeat Pap 1 year due to history of Laser conization of the cervix 02/2017  Follow up Return in about 1 year (around 06/07/2020) for Follow up, with Dr Elonda Husky, yearly.     All questions were answered.

## 2019-06-09 ENCOUNTER — Ambulatory Visit (INDEPENDENT_AMBULATORY_CARE_PROVIDER_SITE_OTHER): Payer: BC Managed Care – PPO | Admitting: *Deleted

## 2019-06-09 DIAGNOSIS — D6859 Other primary thrombophilia: Secondary | ICD-10-CM

## 2019-06-09 DIAGNOSIS — Z5181 Encounter for therapeutic drug level monitoring: Secondary | ICD-10-CM

## 2019-06-09 DIAGNOSIS — Z952 Presence of prosthetic heart valve: Secondary | ICD-10-CM | POA: Diagnosis not present

## 2019-06-09 LAB — POCT INR: INR: 4.6 — AB (ref 2.0–3.0)

## 2019-06-09 NOTE — Patient Instructions (Signed)
Still on steroid eye drop Lt eye twice daily Hold warfarin tonight then resume 1 tablet daily except 1 1/2 tablets on Tuesdays, Thursdays and Saturdays Recheck in 3 weeks

## 2019-06-11 LAB — CYTOLOGY - PAP
Comment: NEGATIVE
Comment: NEGATIVE
Comment: NEGATIVE
Diagnosis: NEGATIVE
HPV 16: NEGATIVE
HPV 18 / 45: NEGATIVE
High risk HPV: POSITIVE — AB

## 2019-06-16 DIAGNOSIS — H52212 Irregular astigmatism, left eye: Secondary | ICD-10-CM | POA: Diagnosis not present

## 2019-06-16 DIAGNOSIS — Z947 Corneal transplant status: Secondary | ICD-10-CM | POA: Diagnosis not present

## 2019-06-16 DIAGNOSIS — H168 Other keratitis: Secondary | ICD-10-CM | POA: Diagnosis not present

## 2019-06-17 DIAGNOSIS — H16122 Filamentary keratitis, left eye: Secondary | ICD-10-CM | POA: Diagnosis not present

## 2019-06-18 DIAGNOSIS — M0609 Rheumatoid arthritis without rheumatoid factor, multiple sites: Secondary | ICD-10-CM | POA: Diagnosis not present

## 2019-06-18 DIAGNOSIS — Z79899 Other long term (current) drug therapy: Secondary | ICD-10-CM | POA: Diagnosis not present

## 2019-06-18 DIAGNOSIS — M2550 Pain in unspecified joint: Secondary | ICD-10-CM | POA: Diagnosis not present

## 2019-06-18 DIAGNOSIS — M329 Systemic lupus erythematosus, unspecified: Secondary | ICD-10-CM | POA: Diagnosis not present

## 2019-06-19 DIAGNOSIS — G894 Chronic pain syndrome: Secondary | ICD-10-CM | POA: Diagnosis not present

## 2019-06-26 ENCOUNTER — Ambulatory Visit: Payer: BC Managed Care – PPO | Admitting: Cardiology

## 2019-06-30 ENCOUNTER — Ambulatory Visit (INDEPENDENT_AMBULATORY_CARE_PROVIDER_SITE_OTHER): Payer: BC Managed Care – PPO | Admitting: *Deleted

## 2019-06-30 ENCOUNTER — Other Ambulatory Visit: Payer: Self-pay

## 2019-06-30 DIAGNOSIS — Z5181 Encounter for therapeutic drug level monitoring: Secondary | ICD-10-CM | POA: Diagnosis not present

## 2019-06-30 DIAGNOSIS — D6859 Other primary thrombophilia: Secondary | ICD-10-CM | POA: Diagnosis not present

## 2019-06-30 DIAGNOSIS — Z952 Presence of prosthetic heart valve: Secondary | ICD-10-CM

## 2019-06-30 LAB — POCT INR: INR: 3.7 — AB (ref 2.0–3.0)

## 2019-06-30 NOTE — Patient Instructions (Signed)
Still on steroid eye drop Lt eye daily Take warfarin 1 tablet tonight then decrease dose to 1 tablet daily except 1 1/2 tablets on Tuesdays and Saturdays Recheck in 4 weeks

## 2019-07-07 DIAGNOSIS — E039 Hypothyroidism, unspecified: Secondary | ICD-10-CM | POA: Diagnosis not present

## 2019-07-07 LAB — T4, FREE: Free T4: 1.3 ng/dL (ref 0.8–1.8)

## 2019-07-07 LAB — TSH: TSH: 1.76 mIU/L

## 2019-07-15 ENCOUNTER — Encounter: Payer: Self-pay | Admitting: Cardiology

## 2019-07-15 ENCOUNTER — Other Ambulatory Visit: Payer: Self-pay

## 2019-07-15 ENCOUNTER — Encounter: Payer: Self-pay | Admitting: "Endocrinology

## 2019-07-15 ENCOUNTER — Ambulatory Visit (INDEPENDENT_AMBULATORY_CARE_PROVIDER_SITE_OTHER): Payer: Medicare Other | Admitting: "Endocrinology

## 2019-07-15 VITALS — BP 107/76 | HR 71 | Ht 64.0 in | Wt 166.2 lb

## 2019-07-15 DIAGNOSIS — E039 Hypothyroidism, unspecified: Secondary | ICD-10-CM

## 2019-07-15 MED ORDER — LEVOTHYROXINE SODIUM 75 MCG PO TABS: 75.0000 ug | ORAL_TABLET | Freq: Every day | ORAL | 1 refills | Status: AC

## 2019-07-15 NOTE — Progress Notes (Signed)
07/15/2019   Endocrinology follow-up note   Past Medical History:  Diagnosis Date  . Abnormal Papanicolaou smear of cervix with positive human papilloma virus (HPV) test 08/17/2015  . Anticoagulation goal of INR 2.5 to 3.5   . Anxiety   . B12 deficiency   . BRCA1 negative   . BRCA2 negative   . Breast cancer, left breast ([place]) 2011   S/P mastectomy; chemo; "no radiation due to lupus"  . Bursitis of right knee    Septic bursitis  . Chronic lower back pain   . Drug-induced hepatitis    States per her rheumatologist, transaminases elevated but normalized after drug removed for   . Fibromyalgia   . GERD (gastroesophageal reflux disease)   . 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 History of abnormal cervical Pap smear 08/12/2015  . History of blood transfusion   . HSIL (high grade squamous intraepithelial lesion) on Pap smear of cervix 01/14/2017   colpo with Dr Elonda Husky   . Hypothyroidism   . Mitral valve disease, rheumatic    St. Jude prosthesis  . Peripheral neuropathy   . Pneumonia   . Raynaud phenomenon   . Rheumatoid arthritis ([place])   . SLE (systemic lupus erythematosus) ([place])    Past Surgical History:  Procedure Laterality Date  . APPENDECTOMY N/A 05/23/2016   Procedure: OPEN APPENDECTOMY WITH DRAINAGE OF ABSCESS;  Surgeon: Fanny Skates, MD;  Location: [facility];  Service: General;  Laterality: N/A;  . APPENDECTOMY    . BREAST BIOPSY Left 2012  . BREAST IMPLANT EXCHANGE Left 12/2015  . CARDIAC CATHETERIZATION  2008  . CARDIAC VALVE REPLACEMENT    . CERVICAL CONIZATION W/BX N/A 03/06/2017   Procedure: Laser Conization of Cervix;  Surgeon: Florian Buff, MD;  Location[facility];  Service: Gynecology;  Laterality: N/A;  . ENDOMETRIAL ABLATION  2008   She no longer has menses  . ESOPHAGOGASTRODUODENOSCOPY N/A 03/25/2013   Dr. Raliegh Scarlet reflux esophagitis-likely source of patient's symptoms (patulous EG junction). Hiatal hernia otherwise normal   . LAPAROSCOPIC CHOLECYSTECTOMY    . LIVER BIOPSY  11/28/2016   liver core/notes 11/28/2016  . MASTECTOMY Left 2012  . MASTOPEXY  02/14/2011   Procedure: MASTOPEXY;  Surgeon: Macon Large;  Location: [facility];  Service: Plastics;  Laterality: Bilateral;  Right Breast Reduction   . MITRAL VALVE REPLACEMENT  2008  . TISSUE EXPANDER PLACEMENT  02/14/2011   Procedure: TISSUE EXPANDER;  Surgeon: Macon Large;  Location: [facility];  Service: Plastics;  Laterality: Bilateral;  Left Breast Remove Tissue Expander Placement of Implant Breast Reconstruction  . TISSUE EXPANDER PLACEMENT Left 11/16/2010   breasst  . TUBAL LIGATION       Follow-up for hypothyroidism   [name] is a 49 y.o.-year-old female. She is here to f/u for hypothyroidism. She is currently on levothyroxine 75 mcg daily before breakfast.  She is compliant with her medication.  She has no new complaints.  She has gained 4 pounds since last visit, not looking for weight loss. -  Denies heat/cold intolerance.  Denies palpitations, tremors.    ROS: Limited as above.  PE: BP 107/76   Pulse 71   Ht 5' 4"  (1.626 m)   Wt 166 lb 3.2 oz (75.4 kg)   BMI 28.53 kg/m  Wt Readings from Last 3 Encounters:  07/15/19 166 lb 3.2 oz (75.4 kg)  12/18/18 162 lb 6.4 oz (73.7 kg)  12/04/18 162 lb (73.5 kg)  Recent Results (from the past 2160 hour(s))  POCT INR     Status: Abnormal   Collection Time: 05/12/19 11:00 AM  Result Value Ref Range   INR 3.1 (A) 2.0 - 3.0  Cytology - PAP( [facility])     Status: Abnormal   Collection Time: 06/08/19  8:55 AM  Result Value Ref Range   High risk HPV Positive (A)    HPV 16 Negative    HPV 18 / 45 Negative    Adequacy      Satisfactory for evaluation. The presence or absence of an   Adequacy      endocervical/transformation zone component cannot be determined because   Adequacy of atrophy.    Diagnosis      - Negative for intraepithelial lesion or malignancy (NILM)   Comment  Inflammation and atrophic changes are present.    Comment Normal Reference Range HPV - Negative    Comment Normal Reference Range HPV 16 18 45 -Negative    Comment Normal Reference Range HPV 16- Negative   POCT INR     Status: Abnormal   Collection Time: 06/09/19 11:12 AM  Result Value Ref Range   INR 4.6 (A) 2.0 - 3.0  POCT INR     Status: Abnormal   Collection Time: 06/30/19 10:54 AM  Result Value Ref Range   INR 3.7 (A) 2.0 - 3.0  TSH [facility]     Status: None   Collection Time: 07/07/19 10:35 AM  Result Value Ref Range   TSH 1.76 mIU/L    Comment:           Reference Range .           > or = 20 Years  0.40-4.50 .                Pregnancy Ranges           First trimester    0.26-2.66           Second trimester   0.55-2.73           Third trimester    0.43-2.91   T4, free [facility]     Status: None   Collection Time: 07/07/19 10:35 AM  Result Value Ref Range   Free T4 1.3 0.8 - 1.8 ng/dL     ASSESSMENT: 1. Hypothyroidism  PLAN:   Her thyroid function tests are consistent with appropriate replacement.  She is advised to continue levothyroxine 75 mcg daily before breakfast.    - We discussed about the correct intake of her thyroid hormone, on empty stomach at fasting, with water, separated by at least 30 minutes from breakfast and other medications,  and separated by more than 4 hours from calcium, iron, multivitamins, acid reflux medications (PPIs). -Patient is made aware of the fact that thyroid hormone replacement is needed for life, dose to be adjusted by periodic monitoring of thyroid function tests.     - Time spent on this patient care encounter:  20 minutes of which 50% was spent in  counseling and the rest reviewing  her current and  previous labs / studies and medications  doses and developing a plan for long term care. [name]  participated in the discussions, expressed understanding, and voiced agreement with the above plans.  All questions were answered  to her satisfaction. she is encouraged to contact clinic should she have any questions or concerns prior to her return visit.   Glade Lloyd, MD Phone: [phone]  Fax: [phone]  -  This note was partially dictated with voice recognition software. Similar sounding words can be transcribed inadequately or may not  be corrected upon review.  07/15/2019, 7:47 PM

## 2019-07-15 NOTE — Progress Notes (Signed)
Cardiology Office Note  Date: 07/16/2019   ID: [name], DOB 07-Oct-1970, MRN [id number]  PCP:  Sharilyn Sites, MD  Cardiologist:  Rozann Lesches, MD Electrophysiologist:  None   Chief Complaint  Patient presents with  . Cardiac follow-up    History of Present Illness: [name] is a 49 y.o. female last assessed via telehealth encounter in August 2020.  She presents for a routine visit.  Since last evaluation she does not report any major change in cardiac symptoms, no exertional chest pain or breathlessness beyond NYHA class II.  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  She remains on Coumadin with follow-up in the anticoagulation clinic.  Last INR was 3.7.  She does not report any spontaneous bleeding problems.  I reviewed the remainder of her cardiac medications which are outlined below.  Her last echocardiogram was in 2019 at which point mitral prosthesis was functioning normally.  Past Medical History:  Diagnosis Date  . Abnormal Papanicolaou smear of cervix with positive human papilloma virus (HPV) test 08/17/2015  . Anticoagulation goal of INR 2.5 to 3.5   . Anxiety   . B12 deficiency   . BRCA1 negative   . BRCA2 negative   . Breast cancer, left breast ([place]) 2011   S/P mastectomy; chemo; "no radiation due to lupus"  . Bursitis of right knee    Septic bursitis  . Chronic lower back pain   . Drug-induced hepatitis    States per her rheumatologist, transaminases elevated but normalized after drug removed for   . Fibromyalgia   . GERD (gastroesophageal reflux disease)   . 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 History of abnormal cervical Pap smear 08/12/2015  . History of blood transfusion   . HSIL (high grade squamous intraepithelial lesion) on Pap smear of cervix 01/14/2017   colpo with Dr Elonda Husky   . Hypothyroidism   . Mitral valve  disease, rheumatic    St. Jude prosthesis  . Peripheral neuropathy   . Pneumonia   . Raynaud phenomenon   . Rheumatoid arthritis ([place])   . SLE (systemic lupus erythematosus) ([place])     Past Surgical History:  Procedure Laterality Date  . APPENDECTOMY N/A 05/23/2016   Procedure: OPEN APPENDECTOMY WITH DRAINAGE OF ABSCESS;  Surgeon: Fanny Skates, MD;  Location: [facility];  Service: General;  Laterality: N/A;  . APPENDECTOMY    . BREAST BIOPSY Left 2012  . BREAST IMPLANT EXCHANGE Left 12/2015  . CARDIAC CATHETERIZATION  2008  . CARDIAC VALVE REPLACEMENT    . CERVICAL CONIZATION W/BX N/A 03/06/2017   Procedure: Laser Conization of Cervix;  Surgeon: Florian Buff, MD;  Location: [facility];  Service: Gynecology;  Laterality: N/A;  . ENDOMETRIAL ABLATION  2008   She no longer has menses  . ESOPHAGOGASTRODUODENOSCOPY N/A 03/25/2013   Dr. Raliegh Scarlet reflux esophagitis-likely source of patient's symptoms (patulous EG junction). Hiatal hernia otherwise normal  . LAPAROSCOPIC CHOLECYSTECTOMY    . LIVER BIOPSY  11/28/2016   liver core/notes 11/28/2016  . MASTECTOMY Left 2012  . MASTOPEXY  02/14/2011   Procedure: MASTOPEXY;  Surgeon: Macon Large;  Location: [facility];  Service: Plastics;  Laterality: Bilateral;  Right Breast Reduction   . MITRAL VALVE REPLACEMENT  2008  . TISSUE EXPANDER PLACEMENT  02/14/2011   Procedure: TISSUE EXPANDER;  Surgeon: Macon Large;  Location: [facility];  Service: Clinical cytogeneticist;  Laterality: Bilateral;  Left Breast Remove Tissue Expander Placement of Implant Breast Reconstruction  . TISSUE EXPANDER PLACEMENT Left 11/16/2010   breasst  . TUBAL LIGATION      Current Outpatient Medications  Medication Sig Dispense Refill  . albuterol (VENTOLIN HFA) 108 (90 Base) MCG/ACT inhaler SMARTSIG:1-2 Puff(s) Via Inhaler Daily    . cyanocobalamin (,VITAMIN B-12,) 1000 MCG/ML injection Inject 1 mL (1,000 mcg total) into the muscle every 30 (thirty) days. 10 mL prn  . DEXILANT 60 MG  capsule TAKE ONE CAPSULE BY MOUTH DAILY. 90 capsule 3  . famotidine (PEPCID) 40 MG tablet TAKE (1) TABLET BY MOUTH AT BEDTIME. 90 tablet 1  . furosemide (LASIX) 40 MG tablet TAKE ONE TABLET BY MOUTH ONCE DAILY. 90 tablet 1  . ibuprofen (ADVIL,MOTRIN) 800 MG tablet Take 800 mg by mouth 2 (two) times daily as needed for mild pain or moderate pain. Reported on 08/30/2015    . levothyroxine (SYNTHROID) 75 MCG tablet Take 1 tablet (75 mcg total) by mouth daily before breakfast. 95 tablet 1  . metoCLOPramide (REGLAN) 5 MG tablet TAKE (1) TABLET BY MOUTH TWICE DAILY. 180 tablet 2  . ondansetron (ZOFRAN) 4 MG tablet Take 1 tablet (4 mg total) by mouth every 8 (eight) hours as needed for nausea or vomiting. 60 tablet 11  . oxyCODONE-acetaminophen (PERCOCET/ROXICET) 5-325 MG tablet Take 1-2 tablets by mouth every 4 (four) hours as needed for severe pain. 30 tablet 0  . potassium chloride (KLOR-CON 10) 10 MEQ tablet Take 10 mEq by mouth every evening. Take one tablet daily as directed    . promethazine (PHENERGAN) 12.5 MG tablet Take 1 tablet (12.5 mg total) by mouth every 6 (six) hours as needed for nausea or vomiting. 60 tablet 11  . riTUXimab (RITUXAN) 100 MG/10ML injection Inject into the vein. Every 4 months    . rOPINIRole (REQUIP) 1 MG tablet Take 1 mg by mouth at bedtime.    Marland Kitchen warfarin (COUMADIN) 5 MG tablet TAKE 1 & 1/2 TABLETS BY MOUTH DAILY OR AS DIRECTED 135 tablet 3  . zolpidem (AMBIEN) 10 MG tablet Take 10 mg by mouth at bedtime.      No current facility-administered medications for this visit.   Allergies:  Methotrexate derivatives and Other   ROS:   Occasionally feels brief palpitations, no lightheadedness or syncope.  No prolonged symptoms.  Physical Exam: VS:  BP 116/70   Pulse 74   Ht _0  (1.626 m)   Wt 168 lb (76.2 kg)   SpO2 99%   BMI 28.84 kg/m , BMI Body mass index is 28.84 kg/m.  Wt Readings from Last 3 Encounters:  07/16/19 168 lb (76.2 kg)  07/15/19 166 lb 3.2 oz  (75.4 kg)  12/18/18 162 lb 6.4 oz (73.7 kg)    General: Patient appears comfortable at rest. HEENT: Conjunctiva and lids normal, wearing a mask. Neck: Supple, no elevated JVP or carotid bruits, no thyromegaly. Lungs: Clear to auscultation, nonlabored breathing at rest. Cardiac: Regular rate and rhythm, no S3, prosthetic valve sound in S1 with soft systolic murmur, no pericardial rub. Extremities: No pitting edema, distal pulses 2+.  ECG:  An ECG dated 07/23/2017 was personally reviewed today and demonstrated:  Sinus rhythm with R' in lead V1.  Recent Labwork: 11/25/2018: ALT 16; AST 22; BUN 10; Creatinine, Ser 0.60; Hemoglobin 12.4; Platelets 218; Potassium 4.3; Sodium 140 07/07/2019: TSH 1.76   Other Studies Reviewed Today:  Echocardiogram 08/19/2017: Study Conclusions   -  Left ventricle: The cavity size was normal. Wall thickness was  increased in a pattern of mild LVH. Systolic function was normal.  The estimated ejection fraction was in the range of 60% to 65%.  Wall motion was normal; there were no regional wall motion  abnormalities. The study is not technically sufficient to allow  evaluation of LV diastolic function due to the presence of a  mechanical mitral prosthesis.  - Mitral valve: Normally functioning mechanical mitral prosthesis  (St. Jude). Mean gradient (D): 3 mm Hg.   Assessment and Plan:  1.  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  She remains clinically stable, cardiac examination is also stable.  Her last echocardiogram was in 2019 as outlined above.  Continue Coumadin with follow-up in the anticoagulation clinic.  2.  Intermittent palpitations, these remain brief and are not associated with lightheadedness or syncope.  Previous cardiac monitoring showed suspected aberrantly conducted SVT.  Continue with observation for now.  Medication Adjustments/Labs and Tests Ordered: Current medicines are reviewed at length with  the patient today.  Concerns regarding medicines are outlined above.   Tests Ordered: No orders of the defined types were placed in this encounter.   Medication Changes: No orders of the defined types were placed in this encounter.   Disposition:  Follow up 6 months in the [place] office.  Signed, Satira Sark, MD, [facility] 07/16/2019 2:27 PM    [facility] at [facility] [address], [place], [place] [address] Phone: [phone]; Fax: [phone]

## 2019-07-16 ENCOUNTER — Encounter: Payer: Self-pay | Admitting: Cardiology

## 2019-07-16 ENCOUNTER — Ambulatory Visit (INDEPENDENT_AMBULATORY_CARE_PROVIDER_SITE_OTHER): Payer: BC Managed Care – PPO | Admitting: Cardiology

## 2019-07-16 VITALS — BP 116/70 | HR 74 | Ht 64.0 in | Wt 168.0 lb

## 2019-07-16 DIAGNOSIS — R002 Palpitations: Secondary | ICD-10-CM | POA: Diagnosis not present

## 2019-07-16 DIAGNOSIS — Z952 Presence of prosthetic heart valve: Secondary | ICD-10-CM | POA: Diagnosis not present

## 2019-07-16 NOTE — Patient Instructions (Signed)
Medication Instructions:  Your physician recommends that you continue on your current medications as directed. Please refer to the Current Medication list given to you today.  *If you need a refill on your cardiac medications before your next appointment, please call your pharmacy*   Lab Work: None today If you have labs (blood work) drawn today and your tests are completely normal, you will receive your results only by: . MyChart Message (if you have MyChart) OR . A paper copy in the mail If you have any lab test that is abnormal or we need to change your treatment, we will call you to review the results.   Testing/Procedures: None today   Follow-Up: At [facility], you and your health needs are our priority.  As part of our continuing mission to provide you with exceptional heart care, we have created designated Provider Care Teams.  These Care Teams include your primary Cardiologist (physician) and Advanced Practice Providers (APPs -  Physician Assistants and Nurse Practitioners) who all work together to provide you with the care you need, when you need it.  We recommend signing up for the patient portal called "MyChart".  Sign up information is provided on this After Visit Summary.  MyChart is used to connect with patients for Virtual Visits (Telemedicine).  Patients are able to view lab/test results, encounter notes, upcoming appointments, etc.  Non-urgent messages can be sent to your provider as well.   To learn more about what you can do with MyChart, go to [email or web address].    Your next appointment:   6 month(s)  The format for your next appointment:   In Person  Provider:   Samuel McDowell, MD or Brittany Strader, PA-C   Other Instructions None      Thank you for choosing [facility] !         

## 2019-07-28 ENCOUNTER — Ambulatory Visit (INDEPENDENT_AMBULATORY_CARE_PROVIDER_SITE_OTHER): Payer: BC Managed Care – PPO | Admitting: *Deleted

## 2019-07-28 ENCOUNTER — Other Ambulatory Visit: Payer: Self-pay

## 2019-07-28 DIAGNOSIS — Z5181 Encounter for therapeutic drug level monitoring: Secondary | ICD-10-CM | POA: Diagnosis not present

## 2019-07-28 DIAGNOSIS — D6859 Other primary thrombophilia: Secondary | ICD-10-CM | POA: Diagnosis not present

## 2019-07-28 DIAGNOSIS — Z952 Presence of prosthetic heart valve: Secondary | ICD-10-CM

## 2019-07-28 DIAGNOSIS — Z6829 Body mass index (BMI) 29.0-29.9, adult: Secondary | ICD-10-CM | POA: Diagnosis not present

## 2019-07-28 DIAGNOSIS — F329 Major depressive disorder, single episode, unspecified: Secondary | ICD-10-CM | POA: Diagnosis not present

## 2019-07-28 DIAGNOSIS — G894 Chronic pain syndrome: Secondary | ICD-10-CM | POA: Diagnosis not present

## 2019-07-28 DIAGNOSIS — Z1389 Encounter for screening for other disorder: Secondary | ICD-10-CM | POA: Diagnosis not present

## 2019-07-28 DIAGNOSIS — E663 Overweight: Secondary | ICD-10-CM | POA: Diagnosis not present

## 2019-07-28 LAB — POCT INR: INR: 2.5 (ref 2.0–3.0)

## 2019-07-28 NOTE — Patient Instructions (Signed)
Still on steroid eye drop Lt eye daily Take warfarin 2 tablets tonight then continue 1 tablet daily except 1 1/2 tablets on Tuesdays and Saturdays Recheck in 4 weeks

## 2019-08-03 ENCOUNTER — Other Ambulatory Visit: Payer: Self-pay | Admitting: Cardiology

## 2019-08-05 ENCOUNTER — Encounter: Payer: Self-pay | Admitting: Internal Medicine

## 2019-08-13 DIAGNOSIS — M0609 Rheumatoid arthritis without rheumatoid factor, multiple sites: Secondary | ICD-10-CM | POA: Diagnosis not present

## 2019-08-25 ENCOUNTER — Other Ambulatory Visit: Payer: Self-pay

## 2019-08-25 ENCOUNTER — Ambulatory Visit (INDEPENDENT_AMBULATORY_CARE_PROVIDER_SITE_OTHER): Payer: BC Managed Care – PPO | Admitting: *Deleted

## 2019-08-25 DIAGNOSIS — Z952 Presence of prosthetic heart valve: Secondary | ICD-10-CM

## 2019-08-25 DIAGNOSIS — D6859 Other primary thrombophilia: Secondary | ICD-10-CM | POA: Diagnosis not present

## 2019-08-25 DIAGNOSIS — Z5181 Encounter for therapeutic drug level monitoring: Secondary | ICD-10-CM

## 2019-08-25 LAB — POCT INR: INR: 1.7 — AB (ref 2.0–3.0)

## 2019-08-25 NOTE — Patient Instructions (Signed)
Still on steroid eye drop Lt eye daily Take warfarin 2 tablets tonight and tomorrow night then increase dose to 1 tablet daily except 1 1/2 tablets on Tuesdays, Thursdays and Saturdays Recheck in 2 weeks

## 2019-08-26 DIAGNOSIS — G894 Chronic pain syndrome: Secondary | ICD-10-CM | POA: Diagnosis not present

## 2019-08-27 DIAGNOSIS — H52212 Irregular astigmatism, left eye: Secondary | ICD-10-CM | POA: Diagnosis not present

## 2019-08-27 DIAGNOSIS — Z947 Corneal transplant status: Secondary | ICD-10-CM | POA: Diagnosis not present

## 2019-09-08 ENCOUNTER — Ambulatory Visit (INDEPENDENT_AMBULATORY_CARE_PROVIDER_SITE_OTHER): Payer: BC Managed Care – PPO | Admitting: *Deleted

## 2019-09-08 DIAGNOSIS — D6859 Other primary thrombophilia: Secondary | ICD-10-CM

## 2019-09-08 DIAGNOSIS — Z5181 Encounter for therapeutic drug level monitoring: Secondary | ICD-10-CM

## 2019-09-08 DIAGNOSIS — Z952 Presence of prosthetic heart valve: Secondary | ICD-10-CM | POA: Diagnosis not present

## 2019-09-08 LAB — POCT INR: INR: 2.6 (ref 2.0–3.0)

## 2019-09-08 NOTE — Patient Instructions (Signed)
Continue warfarin 1 tablet daily except 1 1/2 tablets on Tuesdays, Thursdays and Saturdays.   Recheck in 4 weeks 

## 2019-09-16 DIAGNOSIS — H16142 Punctate keratitis, left eye: Secondary | ICD-10-CM | POA: Diagnosis not present

## 2019-09-16 DIAGNOSIS — Z947 Corneal transplant status: Secondary | ICD-10-CM | POA: Diagnosis not present

## 2019-09-16 DIAGNOSIS — Z48810 Encounter for surgical aftercare following surgery on the sense organs: Secondary | ICD-10-CM | POA: Diagnosis not present

## 2019-09-16 DIAGNOSIS — Z9889 Other specified postprocedural states: Secondary | ICD-10-CM | POA: Diagnosis not present

## 2019-09-17 DIAGNOSIS — Z6828 Body mass index (BMI) 28.0-28.9, adult: Secondary | ICD-10-CM | POA: Diagnosis not present

## 2019-09-17 DIAGNOSIS — I059 Rheumatic mitral valve disease, unspecified: Secondary | ICD-10-CM | POA: Diagnosis not present

## 2019-09-17 DIAGNOSIS — Z1389 Encounter for screening for other disorder: Secondary | ICD-10-CM | POA: Diagnosis not present

## 2019-09-17 DIAGNOSIS — D0592 Unspecified type of carcinoma in situ of left breast: Secondary | ICD-10-CM | POA: Diagnosis not present

## 2019-09-17 DIAGNOSIS — Z0001 Encounter for general adult medical examination with abnormal findings: Secondary | ICD-10-CM | POA: Diagnosis not present

## 2019-09-22 ENCOUNTER — Other Ambulatory Visit: Payer: Self-pay | Admitting: Gastroenterology

## 2019-10-05 DIAGNOSIS — H52212 Irregular astigmatism, left eye: Secondary | ICD-10-CM | POA: Diagnosis not present

## 2019-10-05 DIAGNOSIS — Z947 Corneal transplant status: Secondary | ICD-10-CM | POA: Diagnosis not present

## 2019-10-06 ENCOUNTER — Ambulatory Visit (INDEPENDENT_AMBULATORY_CARE_PROVIDER_SITE_OTHER): Payer: BC Managed Care – PPO | Admitting: *Deleted

## 2019-10-06 DIAGNOSIS — D6859 Other primary thrombophilia: Secondary | ICD-10-CM

## 2019-10-06 DIAGNOSIS — Z5181 Encounter for therapeutic drug level monitoring: Secondary | ICD-10-CM

## 2019-10-06 DIAGNOSIS — Z952 Presence of prosthetic heart valve: Secondary | ICD-10-CM

## 2019-10-06 LAB — POCT INR: INR: 3.1 — AB (ref 2.0–3.0)

## 2019-10-06 NOTE — Patient Instructions (Signed)
Continue warfarin 1 tablet daily except 1 1/2 tablets on Tuesdays, Thursdays and Saturdays.   Recheck in 4 weeks 

## 2019-10-08 ENCOUNTER — Encounter: Payer: Self-pay | Admitting: Genetic Counselor

## 2019-10-20 DIAGNOSIS — G894 Chronic pain syndrome: Secondary | ICD-10-CM | POA: Diagnosis not present

## 2019-11-02 DIAGNOSIS — M0609 Rheumatoid arthritis without rheumatoid factor, multiple sites: Secondary | ICD-10-CM | POA: Diagnosis not present

## 2019-11-05 ENCOUNTER — Other Ambulatory Visit: Payer: Self-pay

## 2019-11-05 ENCOUNTER — Ambulatory Visit (INDEPENDENT_AMBULATORY_CARE_PROVIDER_SITE_OTHER): Payer: BC Managed Care – PPO | Admitting: *Deleted

## 2019-11-05 DIAGNOSIS — Z952 Presence of prosthetic heart valve: Secondary | ICD-10-CM

## 2019-11-05 DIAGNOSIS — D6859 Other primary thrombophilia: Secondary | ICD-10-CM

## 2019-11-05 DIAGNOSIS — Z5181 Encounter for therapeutic drug level monitoring: Secondary | ICD-10-CM | POA: Diagnosis not present

## 2019-11-05 LAB — POCT INR: INR: 7.1 — AB (ref 2.0–3.0)

## 2019-11-05 NOTE — Patient Instructions (Signed)
Had Truxima 500/mg infusion last Monday 9/13 Pt does not want to go to lab Hold warfarin x 3 days then resume 1 tablet daily except 1 1/2 tablets on Tuesdays, Thursdays and Saturdays Recheck INR on Monday Bleeding and fall precautions discussed with pt and she verbalized understanding

## 2019-11-09 ENCOUNTER — Emergency Department (HOSPITAL_COMMUNITY): Payer: BC Managed Care – PPO

## 2019-11-09 ENCOUNTER — Inpatient Hospital Stay (HOSPITAL_COMMUNITY)
Admission: EM | Admit: 2019-11-09 | Discharge: 2019-11-11 | DRG: 177 | Disposition: A | Discharge status: Home / Self Care | Payer: BC Managed Care – PPO | Attending: Internal Medicine | Admitting: Internal Medicine

## 2019-11-09 ENCOUNTER — Encounter (HOSPITAL_COMMUNITY): Payer: Self-pay | Admitting: *Deleted

## 2019-11-09 ENCOUNTER — Other Ambulatory Visit: Payer: Self-pay

## 2019-11-09 DIAGNOSIS — Z79890 Hormone replacement therapy (postmenopausal): Secondary | ICD-10-CM

## 2019-11-09 DIAGNOSIS — Z7901 Long term (current) use of anticoagulants: Secondary | ICD-10-CM | POA: Diagnosis not present

## 2019-11-09 DIAGNOSIS — M329 Systemic lupus erythematosus, unspecified: Secondary | ICD-10-CM | POA: Diagnosis not present

## 2019-11-09 DIAGNOSIS — J189 Pneumonia, unspecified organism: Secondary | ICD-10-CM

## 2019-11-09 DIAGNOSIS — D849 Immunodeficiency, unspecified: Secondary | ICD-10-CM

## 2019-11-09 DIAGNOSIS — R05 Cough: Secondary | ICD-10-CM | POA: Diagnosis not present

## 2019-11-09 DIAGNOSIS — Z853 Personal history of malignant neoplasm of breast: Secondary | ICD-10-CM

## 2019-11-09 DIAGNOSIS — J181 Lobar pneumonia, unspecified organism: Secondary | ICD-10-CM | POA: Diagnosis not present

## 2019-11-09 DIAGNOSIS — Z79899 Other long term (current) drug therapy: Secondary | ICD-10-CM

## 2019-11-09 DIAGNOSIS — I951 Orthostatic hypotension: Secondary | ICD-10-CM | POA: Diagnosis present

## 2019-11-09 DIAGNOSIS — E039 Hypothyroidism, unspecified: Secondary | ICD-10-CM | POA: Diagnosis present

## 2019-11-09 DIAGNOSIS — K3184 Gastroparesis: Secondary | ICD-10-CM | POA: Diagnosis not present

## 2019-11-09 DIAGNOSIS — M069 Rheumatoid arthritis, unspecified: Secondary | ICD-10-CM | POA: Diagnosis present

## 2019-11-09 DIAGNOSIS — K219 Gastro-esophageal reflux disease without esophagitis: Secondary | ICD-10-CM | POA: Diagnosis not present

## 2019-11-09 DIAGNOSIS — E876 Hypokalemia: Secondary | ICD-10-CM | POA: Diagnosis present

## 2019-11-09 DIAGNOSIS — Z952 Presence of prosthetic heart valve: Secondary | ICD-10-CM | POA: Diagnosis not present

## 2019-11-09 DIAGNOSIS — Z9221 Personal history of antineoplastic chemotherapy: Secondary | ICD-10-CM

## 2019-11-09 DIAGNOSIS — E875 Hyperkalemia: Secondary | ICD-10-CM | POA: Diagnosis not present

## 2019-11-09 DIAGNOSIS — R519 Headache, unspecified: Secondary | ICD-10-CM | POA: Diagnosis not present

## 2019-11-09 DIAGNOSIS — J1282 Pneumonia due to coronavirus disease 2019: Secondary | ICD-10-CM

## 2019-11-09 DIAGNOSIS — J90 Pleural effusion, not elsewhere classified: Secondary | ICD-10-CM | POA: Diagnosis not present

## 2019-11-09 DIAGNOSIS — M797 Fibromyalgia: Secondary | ICD-10-CM | POA: Diagnosis not present

## 2019-11-09 DIAGNOSIS — R9431 Abnormal electrocardiogram [ECG] [EKG]: Secondary | ICD-10-CM | POA: Diagnosis not present

## 2019-11-09 DIAGNOSIS — Z87891 Personal history of nicotine dependence: Secondary | ICD-10-CM | POA: Diagnosis not present

## 2019-11-09 DIAGNOSIS — U071 COVID-19: Secondary | ICD-10-CM | POA: Diagnosis not present

## 2019-11-09 DIAGNOSIS — Z9012 Acquired absence of left breast and nipple: Secondary | ICD-10-CM

## 2019-11-09 DIAGNOSIS — R509 Fever, unspecified: Secondary | ICD-10-CM | POA: Diagnosis not present

## 2019-11-09 DIAGNOSIS — M328 Other forms of systemic lupus erythematosus: Secondary | ICD-10-CM | POA: Diagnosis not present

## 2019-11-09 LAB — URINALYSIS, ROUTINE W REFLEX MICROSCOPIC
Bilirubin Urine: NEGATIVE
Glucose, UA: NEGATIVE mg/dL
Hgb urine dipstick: NEGATIVE
Ketones, ur: 5 mg/dL — AB
Nitrite: NEGATIVE
Protein, ur: 30 mg/dL — AB
Specific Gravity, Urine: 1.014 (ref 1.005–1.030)
pH: 6 (ref 5.0–8.0)

## 2019-11-09 LAB — TRIGLYCERIDES: Triglycerides: 115 mg/dL (ref <150)

## 2019-11-09 LAB — COMPREHENSIVE METABOLIC PANEL
ALT: 24 U/L (ref 0–44)
AST: 30 U/L (ref 15–41)
Albumin: 4.1 g/dL (ref 3.5–5.0)
Alkaline Phosphatase: 61 U/L (ref 38–126)
Anion gap: 13 (ref 5–15)
BUN: 11 mg/dL (ref 6–20)
CO2: 24 mmol/L (ref 22–32)
Calcium: 8.5 mg/dL — ABNORMAL LOW (ref 8.9–10.3)
Chloride: 99 mmol/L (ref 98–111)
Creatinine, Ser: 1.17 mg/dL — ABNORMAL HIGH (ref 0.44–1.00)
GFR calc Af Amer: >60 mL/min (ref >60)
GFR calc non Af Amer: 54 mL/min — ABNORMAL LOW (ref >60)
Glucose, Bld: 87 mg/dL (ref 70–99)
Potassium: 2.5 mmol/L — CL (ref 3.5–5.1)
Sodium: 136 mmol/L (ref 135–145)
Total Bilirubin: 1 mg/dL (ref 0.3–1.2)
Total Protein: 7.3 g/dL (ref 6.5–8.1)

## 2019-11-09 LAB — SARS CORONAVIRUS 2 BY RT PCR (HOSPITAL ORDER, PERFORMED IN ~~LOC~~ HOSPITAL LAB): SARS Coronavirus 2: POSITIVE — AB

## 2019-11-09 LAB — MAGNESIUM: Magnesium: 1.3 mg/dL — ABNORMAL LOW (ref 1.7–2.4)

## 2019-11-09 LAB — TROPONIN I (HIGH SENSITIVITY)
Troponin I (High Sensitivity): 10 ng/L (ref <18)
Troponin I (High Sensitivity): 9 ng/L (ref <18)

## 2019-11-09 LAB — D-DIMER, QUANTITATIVE: D-Dimer, Quant: 0.41 ug/mL-FEU (ref 0.00–0.50)

## 2019-11-09 LAB — CBC
HCT: 37.5 % (ref 36.0–46.0)
Hemoglobin: 13.1 g/dL (ref 12.0–15.0)
MCH: 30.8 pg (ref 26.0–34.0)
MCHC: 34.9 g/dL (ref 30.0–36.0)
MCV: 88 fL (ref 80.0–100.0)
Platelets: 204 10*3/uL (ref 150–400)
RBC: 4.26 MIL/uL (ref 3.87–5.11)
RDW: 13.3 % (ref 11.5–15.5)
WBC: 6.3 10*3/uL (ref 4.0–10.5)
nRBC: 0 % (ref 0.0–0.2)

## 2019-11-09 LAB — LACTIC ACID, PLASMA
Lactic Acid, Venous: 1.3 mmol/L (ref 0.5–1.9)
Lactic Acid, Venous: 0.9 mmol/L (ref 0.5–1.9)

## 2019-11-09 LAB — PROTIME-INR
INR: 2.1 — ABNORMAL HIGH (ref 0.8–1.2)
Prothrombin Time: 22.6 seconds — ABNORMAL HIGH (ref 11.4–15.2)

## 2019-11-09 LAB — C-REACTIVE PROTEIN: CRP: 3 mg/dL — ABNORMAL HIGH (ref <1.0)

## 2019-11-09 LAB — FIBRINOGEN: Fibrinogen: 547 mg/dL — ABNORMAL HIGH (ref 210–475)

## 2019-11-09 LAB — LACTATE DEHYDROGENASE: LDH: 607 U/L — ABNORMAL HIGH (ref 98–192)

## 2019-11-09 LAB — PROCALCITONIN: Procalcitonin: <0.1 ng/mL

## 2019-11-09 LAB — FERRITIN: Ferritin: 474 ng/mL — ABNORMAL HIGH (ref 11–307)

## 2019-11-09 MED ORDER — ONDANSETRON HCL 4 MG/2ML IJ SOLN
4.0000 mg | Freq: Four times a day (QID) | INTRAMUSCULAR | Status: DC | PRN
  Administered 2019-11-09 – 2019-11-10 (×2): 4 mg via INTRAVENOUS
  Filled 2019-11-09 (×2): qty 2

## 2019-11-09 MED ORDER — FAMOTIDINE 20 MG PO TABS
40.0000 mg | ORAL_TABLET | Freq: Every day | ORAL | Status: DC
  Administered 2019-11-09 – 2019-11-10 (×2): 40 mg via ORAL
  Filled 2019-11-09 (×2): qty 2

## 2019-11-09 MED ORDER — ZOLPIDEM TARTRATE 5 MG PO TABS
5.0000 mg | ORAL_TABLET | Freq: Every evening | ORAL | Status: DC | PRN
  Administered 2019-11-09 – 2019-11-10 (×2): 5 mg via ORAL
  Filled 2019-11-09 (×2): qty 1

## 2019-11-09 MED ORDER — OXYCODONE-ACETAMINOPHEN 5-325 MG PO TABS
1.0000 | ORAL_TABLET | ORAL | Status: DC | PRN
  Administered 2019-11-09 (×2): 1 via ORAL
  Administered 2019-11-10: 2 via ORAL
  Filled 2019-11-09: qty 1
  Filled 2019-11-09 (×2): qty 2

## 2019-11-09 MED ORDER — METOCLOPRAMIDE HCL 10 MG PO TABS
5.0000 mg | ORAL_TABLET | Freq: Two times a day (BID) | ORAL | Status: DC
  Administered 2019-11-09 – 2019-11-10 (×3): 5 mg via ORAL
  Filled 2019-11-09 (×3): qty 1

## 2019-11-09 MED ORDER — ONDANSETRON HCL 4 MG/2ML IJ SOLN
4.0000 mg | Freq: Once | INTRAMUSCULAR | Status: AC
  Administered 2019-11-09: 4 mg via INTRAVENOUS
  Filled 2019-11-09: qty 2

## 2019-11-09 MED ORDER — ACETAMINOPHEN 325 MG PO TABS
650.0000 mg | ORAL_TABLET | Freq: Four times a day (QID) | ORAL | Status: DC | PRN
  Administered 2019-11-10 (×2): 650 mg via ORAL
  Filled 2019-11-09 (×2): qty 2

## 2019-11-09 MED ORDER — ENOXAPARIN SODIUM 80 MG/0.8ML ~~LOC~~ SOLN
70.0000 mg | Freq: Two times a day (BID) | SUBCUTANEOUS | Status: DC
  Administered 2019-11-10 – 2019-11-11 (×3): 70 mg via SUBCUTANEOUS
  Filled 2019-11-09 (×3): qty 0.8

## 2019-11-09 MED ORDER — SODIUM CHLORIDE 0.9 % IV SOLN
500.0000 mg | Freq: Once | INTRAVENOUS | Status: AC
  Administered 2019-11-09: 500 mg via INTRAVENOUS
  Filled 2019-11-09: qty 500

## 2019-11-09 MED ORDER — ALBUTEROL SULFATE HFA 108 (90 BASE) MCG/ACT IN AERS
2.0000 | INHALATION_SPRAY | Freq: Four times a day (QID) | RESPIRATORY_TRACT | Status: DC
  Administered 2019-11-09 – 2019-11-10 (×6): 2 via RESPIRATORY_TRACT
  Filled 2019-11-09: qty 6.7

## 2019-11-09 MED ORDER — DEXAMETHASONE SODIUM PHOSPHATE 10 MG/ML IJ SOLN
6.0000 mg | INTRAMUSCULAR | Status: DC
  Administered 2019-11-09 – 2019-11-11 (×3): 6 mg via INTRAVENOUS
  Filled 2019-11-09 (×3): qty 1

## 2019-11-09 MED ORDER — POTASSIUM CHLORIDE CRYS ER 20 MEQ PO TBCR
40.0000 meq | EXTENDED_RELEASE_TABLET | Freq: Once | ORAL | Status: AC
  Administered 2019-11-09: 40 meq via ORAL
  Filled 2019-11-09: qty 2

## 2019-11-09 MED ORDER — HYDROCOD POLST-CPM POLST ER 10-8 MG/5ML PO SUER
5.0000 mL | Freq: Two times a day (BID) | ORAL | Status: DC | PRN
  Administered 2019-11-09 – 2019-11-11 (×4): 5 mL via ORAL
  Filled 2019-11-09 (×4): qty 5

## 2019-11-09 MED ORDER — SODIUM CHLORIDE 0.9 % IV BOLUS
500.0000 mL | Freq: Once | INTRAVENOUS | Status: AC
  Administered 2019-11-09: 500 mL via INTRAVENOUS

## 2019-11-09 MED ORDER — BUPROPION HCL ER (XL) 300 MG PO TB24
300.0000 mg | ORAL_TABLET | Freq: Every day | ORAL | Status: DC
  Administered 2019-11-10 – 2019-11-11 (×2): 300 mg via ORAL
  Filled 2019-11-09: qty 1
  Filled 2019-11-09: qty 2

## 2019-11-09 MED ORDER — GUAIFENESIN-DM 100-10 MG/5ML PO SYRP
10.0000 mL | ORAL_SOLUTION | ORAL | Status: DC | PRN
  Administered 2019-11-10 (×2): 10 mL via ORAL
  Filled 2019-11-09 (×2): qty 10

## 2019-11-09 MED ORDER — MAGNESIUM SULFATE 4 GM/100ML IV SOLN
4.0000 g | Freq: Once | INTRAVENOUS | Status: DC
  Filled 2019-11-09: qty 100

## 2019-11-09 MED ORDER — POTASSIUM CHLORIDE 10 MEQ/100ML IV SOLN
10.0000 meq | INTRAVENOUS | Status: DC
  Filled 2019-11-09: qty 100

## 2019-11-09 MED ORDER — POTASSIUM CHLORIDE 10 MEQ/100ML IV SOLN
10.0000 meq | INTRAVENOUS | Status: AC
  Administered 2019-11-09 (×2): 10 meq via INTRAVENOUS
  Filled 2019-11-09: qty 100

## 2019-11-09 MED ORDER — SODIUM CHLORIDE 0.9 % IV SOLN
100.0000 mg | INTRAVENOUS | Status: AC
  Administered 2019-11-09 (×2): 100 mg via INTRAVENOUS
  Filled 2019-11-09 (×2): qty 20

## 2019-11-09 MED ORDER — POTASSIUM CHLORIDE CRYS ER 20 MEQ PO TBCR
10.0000 meq | EXTENDED_RELEASE_TABLET | Freq: Every evening | ORAL | Status: DC
  Administered 2019-11-09 – 2019-11-10 (×2): 10 meq via ORAL
  Filled 2019-11-09 (×4): qty 1

## 2019-11-09 MED ORDER — SODIUM CHLORIDE 0.9 % IV SOLN: 100.0000 mg | Freq: Every day | INTRAVENOUS | Status: DC

## 2019-11-09 MED ORDER — SODIUM CHLORIDE 0.9 % IV SOLN
100.0000 mg | Freq: Every day | INTRAVENOUS | Status: DC
  Administered 2019-11-10 – 2019-11-11 (×2): 100 mg via INTRAVENOUS
  Filled 2019-11-09 (×2): qty 20

## 2019-11-09 MED ORDER — ACETAMINOPHEN 325 MG PO TABS
650.0000 mg | ORAL_TABLET | Freq: Once | ORAL | Status: AC
  Administered 2019-11-09: 650 mg via ORAL
  Filled 2019-11-09: qty 2

## 2019-11-09 MED ORDER — ROPINIROLE HCL 1 MG PO TABS
1.0000 mg | ORAL_TABLET | Freq: Every day | ORAL | Status: DC
  Administered 2019-11-09 – 2019-11-10 (×2): 1 mg via ORAL
  Filled 2019-11-09 (×2): qty 1

## 2019-11-09 MED ORDER — SODIUM CHLORIDE 0.9 % IV SOLN
1.0000 g | Freq: Once | INTRAVENOUS | Status: AC
  Administered 2019-11-09: 1 g via INTRAVENOUS
  Filled 2019-11-09: qty 10

## 2019-11-09 MED ORDER — KETOROLAC TROMETHAMINE 15 MG/ML IJ SOLN
15.0000 mg | Freq: Once | INTRAMUSCULAR | Status: AC
  Administered 2019-11-09: 15 mg via INTRAVENOUS
  Filled 2019-11-09: qty 1

## 2019-11-09 MED ORDER — PANTOPRAZOLE SODIUM 40 MG PO TBEC
40.0000 mg | DELAYED_RELEASE_TABLET | Freq: Every day | ORAL | Status: DC
  Administered 2019-11-10 – 2019-11-11 (×2): 40 mg via ORAL
  Filled 2019-11-09 (×2): qty 1

## 2019-11-09 MED ORDER — LEVOTHYROXINE SODIUM 75 MCG PO TABS
75.0000 ug | ORAL_TABLET | Freq: Every day | ORAL | Status: DC
  Administered 2019-11-10 – 2019-11-11 (×2): 75 ug via ORAL
  Filled 2019-11-09: qty 1
  Filled 2019-11-09: qty 2

## 2019-11-09 MED ORDER — WARFARIN - PHARMACIST DOSING INPATIENT: Freq: Every day | Status: DC

## 2019-11-09 MED ORDER — SODIUM CHLORIDE 0.9 % IV SOLN: 200.0000 mg | Freq: Once | INTRAVENOUS | Status: DC

## 2019-11-09 MED ORDER — ONDANSETRON HCL 4 MG PO TABS: 4.0000 mg | ORAL_TABLET | Freq: Four times a day (QID) | ORAL | Status: DC | PRN

## 2019-11-09 MED ORDER — WARFARIN SODIUM 7.5 MG PO TABS
7.5000 mg | ORAL_TABLET | Freq: Once | ORAL | Status: DC
  Filled 2019-11-09: qty 1

## 2019-11-09 MED ORDER — ZOLPIDEM TARTRATE 5 MG PO TABS: 10.0000 mg | ORAL_TABLET | Freq: Every evening | ORAL | Status: DC | PRN

## 2019-11-09 NOTE — ED Triage Notes (Signed)
Pt states she fell about a week and a half ago and when she fell she said the back of her head felt "funny" and her legs felt like "jelly"; pt states she just feels bad; pt c/o some heart palpitations and a cough

## 2019-11-09 NOTE — ED Notes (Signed)
Date and time results received: 11/09/19 1050 (use smartphrase ".now" to insert current time)  Test: potassium Critical Value: 2.5  Name of Provider Notified: Dr Jeanell Sparrow  Orders Received? Or Actions Taken?: no/na

## 2019-11-09 NOTE — Progress Notes (Addendum)
ANTICOAGULATION CONSULT NOTE - Initial Consult  Pharmacy Consult for warfarin/lovenox Indication: Mitral valve replacement  Allergies  Allergen Reactions  . Methotrexate Derivatives Other (See Comments)    Raised liver enzymes   . 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 kph    Patient Measurements: Height: 5' 4"  (162.6 cm) Weight: 69.4 kg (152 lb 14.4 oz) IBW/kg (Calculated) : 54.7   Vital Signs: Temp: 102.9 F (39.4 C) (09/20 0835) Temp Source: Oral (09/20 0835) BP: 98/65 (09/20 1156) Pulse Rate: 94 (09/20 1156)  Labs: Recent Labs    11/09/19 1015  HGB 13.1  HCT 37.5  PLT 204  LABPROT 22.6*  INR 2.1*  CREATININE 1.17*    Estimated Creatinine Clearance: 54.4 mL/min (A) (by C-G formula based on SCr of 1.17 mg/dL (H)).   Medical History: Past Medical History:  Diagnosis Date  . Abnormal Papanicolaou smear of cervix with positive human papilloma virus (HPV) test 08/17/2015  . Anticoagulation goal of INR 2.5 to 3.5   . Anxiety   . B12 deficiency   . BRCA1 negative   . BRCA2 negative   . Breast cancer, left breast ([place]) 2011   S/P mastectomy; chemo; "no radiation due to lupus"  . Bursitis of right knee    Septic bursitis  . Chronic lower back pain   . Drug-induced hepatitis    States per her rheumatologist, transaminases elevated but normalized after drug removed for   . Fibromyalgia   . GERD (gastroesophageal reflux disease)   . 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 History of abnormal cervical Pap smear 08/12/2015  . History of blood transfusion   . HSIL (high grade squamous intraepithelial lesion) on Pap smear of cervix 01/14/2017   colpo with Dr Elonda Husky   . Hypothyroidism   . Mitral valve disease, rheumatic    St. Jude prosthesis  . Peripheral neuropathy   . Pneumonia   . Raynaud phenomenon   . Rheumatoid arthritis ([place])    . SLE (systemic lupus erythematosus) ([facility])     Medications:  (Not in a hospital admission)   Assessment: Pharmacy consulted to dose warfarin in patient with mitral valve replacement- mechanical valve.  Patient's home dose listed as 7.5 mg every Tue-Thu-Sat and 5 mg ROW.  Patient recently had INR of 7.1 and held doses this past weekend 9/16 - 9/18  INR on admission 2.1  Goal of Therapy:  INR 2.5-3.5 Monitor platelets by anticoagulation protocol: Yes   Plan:  Warfarin 7.5 mg x 1 dose. Lovenox 70 mg subq every 12 hours. Monitor daily INR and s/s of bleeding.  Margot Ables, PharmD Clinical Pharmacist 11/09/2019 12:01 PM

## 2019-11-09 NOTE — ED Provider Notes (Signed)
[facility] EMERGENCY DEPARTMENT Provider Note   CSN: [id number] Arrival date & time: 11/09/19  0800     History Chief Complaint  Patient presents with  . Generalized Body Aches    [name] is a 49 y.o. female.  HPI     49 yo female ho mitral valve replacement, on coumadin, lupus, immunosuppression, presents today complaining of body aches, fever, cough symptoms began Friday. Associated n/v/d for a couple weeks.  Patient on coumadin and inr>7 on Friday and told to hold coumading over weeken.  Mechanical fall 1.5 weeks ago struck face and shoulder and has had some headache left posterior since.  Today feels generally weak, body aches, and some sob with coughing.  Last ibuprofen at bedtime last night. Patient is not vaccinated against COVID-19  Past Medical History:  Diagnosis Date  . Abnormal Papanicolaou smear of cervix with positive human papilloma virus (HPV) test 08/17/2015  . Anticoagulation goal of INR 2.5 to 3.5   . Anxiety   . B12 deficiency   . BRCA1 negative   . BRCA2 negative   . Breast cancer, left breast ([place]) 2011   S/P mastectomy; chemo; "no radiation due to lupus"  . Bursitis of right knee    Septic bursitis  . Chronic lower back pain   . Drug-induced hepatitis    States per her rheumatologist, transaminases elevated but normalized after drug removed for   . Fibromyalgia   . GERD (gastroesophageal reflux disease)   . 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 History of abnormal cervical Pap smear 08/12/2015  . History of blood transfusion   . HSIL (high grade squamous intraepithelial lesion) on Pap smear of cervix 01/14/2017   colpo with Dr Elonda Husky   . Hypothyroidism   . Mitral valve disease, rheumatic    St. Jude prosthesis  . Peripheral neuropathy   . Pneumonia   . Raynaud phenomenon   . Rheumatoid arthritis ([place])   . SLE (systemic lupus erythematosus) ([place])     Patient Active Problem List   Diagnosis Date Noted    . HSIL (high grade squamous intraepithelial lesion) on Pap smear of cervix 01/14/2017  . Encounter for gynecological examination with Papanicolaou smear of cervix 01/08/2017  . At risk for hemorrhage associated with liver biopsy 11/28/2016  . Elevated LFTs 10/04/2016  . Appendicitis with peritoneal abscess 05/23/2016  . Sepsis, unspecified organism ([place]) 05/22/2016  . AKI (acute kidney injury) ([place]) 05/22/2016  . Abdominal pain 05/21/2016  . Appendicitis with abscess 05/21/2016  . Volume depletion 05/21/2016  . History of breast cancer 05/21/2016  . Vaginal discharge 11/14/2015  . Vaginal burning 11/14/2015  . BV (bacterial vaginosis) 11/14/2015  . Abnormal Papanicolaou smear of cervix with positive human papilloma virus (HPV) test 08/17/2015  . History of abnormal cervical Pap smear 08/12/2015  . Primary hypothyroidism 12/07/2014  . Primary hypercoagulable state ([place]) 12/14/2013  . Encounter for monitoring cardiotoxic drug therapy 11/23/2013  . Gastroparesis 08/13/2013  . Screening for colorectal cancer 03/18/2013  . Dyspepsia 03/12/2013  . Dyspnea 11/14/2012  . Anticoagulation goal of INR 2.5 to 3.5   . Peripheral neuropathy   . B12 deficiency 08/21/2010  . 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 BRCA1 negative 08/16/2010  . BRCA2 negative 08/16/2010  . Long term current use of anticoagulant therapy 05/17/2010  . Malignant neoplasm of upper outer quadrant of female breast ([place]) 02/24/2010  . History of mitral valve replacement with mechanical  valve 11/10/2009  . Systemic lupus erythematosus ([place]) 11/10/2009    Past Surgical History:  Procedure Laterality Date  . APPENDECTOMY N/A 05/23/2016   Procedure: OPEN APPENDECTOMY WITH DRAINAGE OF ABSCESS;  Surgeon: Fanny Skates, MD;  Location: [place];  Service: General;  Laterality: N/A;  . APPENDECTOMY    . BREAST BIOPSY Left 2012  . BREAST IMPLANT EXCHANGE Left 12/2015  . CARDIAC  CATHETERIZATION  2008  . CARDIAC VALVE REPLACEMENT    . CERVICAL CONIZATION W/BX N/A 03/06/2017   Procedure: Laser Conization of Cervix;  Surgeon: Florian Buff, MD;  Location: [facility];  Service: Gynecology;  Laterality: N/A;  . CORNEAL TRANSPLANT Left   . ENDOMETRIAL ABLATION  2008   She no longer has menses  . ESOPHAGOGASTRODUODENOSCOPY N/A 03/25/2013   Dr. Raliegh Scarlet reflux esophagitis-likely source of patient's symptoms (patulous EG junction). Hiatal hernia otherwise normal  . LAPAROSCOPIC CHOLECYSTECTOMY    . LIVER BIOPSY  11/28/2016   liver core/notes 11/28/2016  . MASTECTOMY Left 2012  . MASTOPEXY  02/14/2011   Procedure: MASTOPEXY;  Surgeon: Macon Large;  Location: [place];  Service: Plastics;  Laterality: Bilateral;  Right Breast Reduction   . MITRAL VALVE REPLACEMENT  2008  . TISSUE EXPANDER PLACEMENT  02/14/2011   Procedure: TISSUE EXPANDER;  Surgeon: Macon Large;  Location: [place];  Service: Plastics;  Laterality: Bilateral;  Left Breast Remove Tissue Expander Placement of Implant Breast Reconstruction  . TISSUE EXPANDER PLACEMENT Left 11/16/2010   breasst  . TUBAL LIGATION       OB History    Gravida  2   Para  2   Term  0   Preterm      AB      Living  2     SAB      TAB      Ectopic      Multiple      Live Births  2           Family History  Problem Relation Age of Onset  . Hypertension Mother   . Hyperlipidemia Mother   . Hypertension Father   . Colon cancer Neg Hx   . Colon polyps Neg Hx     Social History   Tobacco Use  . Smoking status: Former Smoker    Packs/day: 1.00    Years: 15.00    Pack years: 15.00    Types: Cigarettes    Quit date: 02/19/2006    Years since quitting: 13.7  . Smokeless tobacco: Never Used  Vaping Use  . Vaping Use: Never used  Substance Use Topics  . Alcohol use: Not Currently    Alcohol/week: 0.0 standard drinks    Comment: 11/28/2016 "couple drinks/yearZ"  . Drug use: No    Home  Medications Prior to Admission medications   Medication Sig Start Date End Date Taking? Authorizing Provider  albuterol (VENTOLIN HFA) 108 (90 Base) MCG/ACT inhaler SMARTSIG:1-2 Puff(s) Via Inhaler Daily 05/07/19   [provider]  cyanocobalamin (,VITAMIN B-12,) 1000 MCG/ML injection Inject 1 mL (1,000 mcg total) into the muscle every 30 (thirty) days. 12/19/10   Everardo All, MD  DEXILANT 60 MG capsule TAKE ONE CAPSULE BY MOUTH DAILY. 09/25/19   Mahala Menghini, PA-C  famotidine (PEPCID) 40 MG tablet TAKE (1) TABLET BY MOUTH AT BEDTIME. 05/21/19   Erenest Rasher, PA-C  furosemide (LASIX) 40 MG tablet TAKE ONE TABLET BY MOUTH ONCE DAILY. 08/04/19   Satira Sark, MD  ibuprofen (ADVIL,MOTRIN) 800 MG tablet Take 800 mg by mouth 2 (two) times daily as needed for mild pain or moderate pain. Reported on 08/30/2015    [provider]  levothyroxine (SYNTHROID) 75 MCG tablet Take 1 tablet (75 mcg total) by mouth daily before breakfast. 07/15/19   Nida, Marella Chimes, MD  metoCLOPramide (REGLAN) 5 MG tablet TAKE (1) TABLET BY MOUTH TWICE DAILY. 02/03/19   Annitta Needs, NP  ondansetron (ZOFRAN) 4 MG tablet Take 1 tablet (4 mg total) by mouth every 8 (eight) hours as needed for nausea or vomiting. 10/09/17   Annitta Needs, NP  oxyCODONE-acetaminophen (PERCOCET/ROXICET) 5-325 MG tablet Take 1-2 tablets by mouth every 4 (four) hours as needed for severe pain. 03/06/17   Florian Buff, MD  potassium chloride (KLOR-CON 10) 10 MEQ tablet Take 10 mEq by mouth every evening. Take one tablet daily as directed 11/09/13   [provider]  promethazine (PHENERGAN) 12.5 MG tablet Take 1 tablet (12.5 mg total) by mouth every 6 (six) hours as needed for nausea or vomiting. 10/09/17   Annitta Needs, NP  riTUXimab (RITUXAN) 100 MG/10ML injection Inject into the vein. Every 4 months    [provider]  rOPINIRole (REQUIP) 1 MG tablet Take 1 mg by mouth at bedtime.    [provider]  warfarin (COUMADIN) 5 MG tablet TAKE 1 & 1/2 TABLETS BY MOUTH DAILY OR AS DIRECTED 01/12/19   Satira Sark, MD  zolpidem (AMBIEN) 10 MG tablet Take 10 mg by mouth at bedtime.  12/19/10   de Stanford Scotland, MD   rataxine Allergies    Methotrexate derivatives and Other  Review of Systems   Review of Systems  Physical Exam Updated Vital Signs BP (!) 131/116 (BP Location: Right Arm)   Pulse (!) 104   Temp (!) 102.9 F (39.4 C) (Oral)   Resp 18   Ht 1.626 m (5' 4" )   Wt 69.4 kg   SpO2 97%   BMI 26.25 kg/m   Physical Exam Vitals and nursing note reviewed.  Constitutional:      General: She is not in acute distress.    Appearance: Normal appearance. She is ill-appearing.  HENT:     Head: Normocephalic.     Right Ear: External ear normal.     Left Ear: External ear normal.     Nose: Nose normal.     Mouth/Throat:     Mouth: Mucous membranes are moist.     Pharynx: Oropharynx is clear.  Eyes:     Pupils: Pupils are equal, round, and reactive to light.  Cardiovascular:     Rate and Rhythm: Tachycardia present.  Pulmonary:     Effort: Pulmonary effort is normal.     Breath sounds: Wheezing present.     Comments: Scattered expiratory wheezes righ tbase Abdominal:     General: Abdomen is flat. Bowel sounds are normal.     Palpations: Abdomen is soft.     Tenderness: There is no abdominal tenderness.  Musculoskeletal:        General: Normal range of motion.     Cervical back: Normal range of motion.  Skin:    General: Skin is warm and dry.     Capillary Refill: Capillary refill takes less than 2 seconds.  Neurological:     General: No focal deficit present.     Mental Status: She is alert.  Psychiatric:        Mood and  Affect: Mood normal.        Behavior: Behavior normal.     ED Results / Procedures / Treatments   Labs (all labs ordered are listed, but only abnormal results are displayed) Labs Reviewed  SARS CORONAVIRUS 2 BY RT PCR ([facility])  CULTURE, BLOOD (ROUTINE X 2)  CULTURE, BLOOD (ROUTINE X 2)  CBC  URINALYSIS, ROUTINE W REFLEX MICROSCOPIC  COMPREHENSIVE METABOLIC PANEL  PROTIME-INR  LACTIC ACID, PLASMA  LACTIC ACID, PLASMA  D-DIMER, QUANTITATIVE (NOT AT [facility])  PROCALCITONIN  LACTATE DEHYDROGENASE  FERRITIN  TRIGLYCERIDES  FIBRINOGEN  C-REACTIVE PROTEIN  POC URINE PREG, ED    EKG EKG Interpretation  Date/Time:  Monday November 09 2019 10:25:48 EDT Ventricular Rate:  93 PR Interval:  126 QRS Duration: 88 QT Interval:  346 QTC Calculation: 430 R Axis:   67 Text Interpretation: Normal sinus rhythm ST & T wave abnormality, consider anterior ischemia Abnormal ECG Confirmed by Pattricia Boss [phone]) on 11/09/2019 11:07:03 AM   Radiology CT Head Wo Contrast  Result Date: 11/09/2019 CLINICAL DATA:  Pain following recent fall EXAM: CT HEAD WITHOUT CONTRAST TECHNIQUE: Contiguous axial images were obtained from the base of the skull through the vertex without intravenous contrast. COMPARISON:  None. FINDINGS: Brain: The ventricles and sulci are normal in size and configuration. There is no intracranial mass, hemorrhage, extra-axial fluid collection, or midline shift. The brain parenchyma appears normal. There is no demonstrable acute infarct. Vascular: No hyperdense vessel. No appreciable vascular calcification. Skull: The bony calvarium appears intact. Sinuses/Orbits: There is mucosal thickening in the maxillary antra bilaterally, more severe on the right than on the left. There is opacification of multiple ethmoid air cells. There is opacification in the frontal sinuses bilaterally. Orbits appear symmetric bilaterally. Other: The mastoid air cells are clear. IMPRESSION: Brain parenchyma appears unremarkable.  No mass or hemorrhage. Multifocal paranasal sinus disease noted. Electronically Signed   By: Lowella Grip III M.D.   On: 11/09/2019 09:39   DG Chest Port 1  View  Result Date: 11/09/2019 CLINICAL DATA:  Cough and fever for the past week. EXAM: PORTABLE CHEST 1 VIEW COMPARISON:  Chest x-Kesha Hurrell dated May 21, 2016. FINDINGS: The heart size and mediastinal contours are within normal limits. Normal pulmonary vascularity. Similar retained epicardial pacing wires overlying the right chest. New patchy opacities at the peripheral right lung base. No focal consolidation, pleural effusion, or pneumothorax. No acute osseous abnormality. IMPRESSION: 1. Right lower lobe pneumonia with appearance concerning for atypical infection, including viral pneumonia. Electronically Signed   By: Titus Dubin M.D.   On: 11/09/2019 09:04    Procedures .Critical Care Performed by: Pattricia Boss, MD Authorized by: Pattricia Boss, MD   Critical care provider statement:    Critical care time (minutes):  45   Critical care end time:  11/09/2019 11:29 AM   Critical care was time spent personally by me on the following activities:  Discussions with consultants, evaluation of patient's response to treatment, examination of patient, ordering and performing treatments and interventions, ordering and review of laboratory studies, ordering and review of radiographic studies, pulse oximetry, re-evaluation of patient's condition, obtaining history from patient or surrogate and review of old charts   (including critical care time)  Medications Ordered in ED Medications  acetaminophen (TYLENOL) tablet 650 mg (has no administration in time range)  sodium chloride 0.9 % bolus 500 mL (has no administration in time range)    ED Course  I  have reviewed the triage vital signs and the nursing notes.  Pertinent labs & imaging results that were available during my care of the patient were reviewed by me and considered in my medical decision making (see chart for details).  Clinical Course as of Nov 09 1122  Mon Nov 09, 2019  2542 Cxr personally reviewed and rad interpretation reviewed Given  onset of sxs and rll infiltrate will initiate abx to treat possible superinfection in patient with covid   [DR]  1107 P.o. and IV potassium ordered   [DR]  1107 EKG reviewed and nonspecific ST-T wave changes notedTroponin added   [DR]    Clinical Course User Index [DR] Pattricia Boss, MD   MDM Rules/Calculators/A&P                          48 female immunosuppressed on rituxan and coumadin presents today with fever, cough- positive covid test here.  Infiltrate on cxr, nausea,v/diarrhea- patient with poor po intake and potassium at 2.5. Inflammatory markers elevated Plan admission for iv fluids, potassium replenishment and remdesivir therapy 1- covid 2-infiltrate on cxr- ? Superinfection - tx's with abx 3 hypokalemia 4- anticoagulated on coumadin- inr 2. 1 patient reports holding over weekend due to inr >7.  No bleeding noted Discussed with Dr. Roderic Palau who will see for admission [name] was evaluated in Emergency Department on 11/09/2019 for the symptoms described in the history of present illness. She was evaluated in the context of the global COVID-19 pandemic, which necessitated consideration that the patient might be at risk for infection with the SARS-CoV-2 virus that causes COVID-19. Institutional protocols and algorithms that pertain to the evaluation of patients at risk for COVID-19 are in a state of rapid change based on information released by regulatory bodies including the CDC and federal and state organizations. These policies and algorithms were followed during the patient's care in the ED.   Final Clinical Impression(s) / ED Diagnoses Final diagnoses:  COVID-19  Community acquired pneumonia of right lower lobe of lung  Hypokalemia  Chronic anticoagulation  Immunosuppression ([place])    Rx / DC Orders ED Discharge Orders    None       Pattricia Boss, MD 11/09/19 1147

## 2019-11-09 NOTE — H&P (Signed)
History and Physical    [name] ZDG:[id number] DOB: [birth year] DOA: 11/09/2019  PCP: Sharilyn Sites, MD  Patient coming from: Home  I have personally briefly reviewed patient's old medical records in [place]  Chief Complaint: Generalized weakness, shortness of breath  HPI: [name] is a 49 y.o. female with medical history significant of lupus on rituximab, hypothyroidism, gastroparesis, mechanical mitral valve on chronic anticoagulation, reports that for the past 3 days she has been having increasing weakness, myalgias.  She has had dyspnea on exertion.  She started to have productive cough and associated pleuritic chest pain that is worse with cough.  She has persistent nausea, vomiting and diarrhea.  P.o. intake has been poor for the past several days.  She is also been feverish.  She has not been vaccinated against COVID-19.  She was advised against vaccination by her rheumatologist since she is on chronic immunosuppression.  ED Course: She is noted to be febrile in the emergency room with a temperature of 102.  COVID-19 test found to be positive.  Chest x-ray indicated infiltrates consistent with pneumonia.  She is significantly hypokalemic and hypomagnesemic.  She will be referred for admission.  Review of Systems:  Review of Systems  Constitutional: Positive for diaphoresis, fever and malaise/fatigue.  HENT: Positive for congestion.   Eyes: Negative for blurred vision and double vision.  Respiratory: Positive for cough, sputum production and shortness of breath.   Cardiovascular: Positive for chest pain. Negative for palpitations.  Gastrointestinal: Positive for abdominal pain, diarrhea, nausea and vomiting. Negative for blood in stool and melena.  Genitourinary: Negative for dysuria.  Musculoskeletal: Positive for myalgias.  Neurological: Positive for dizziness and weakness.   .    Past Medical History:  Diagnosis Date  . Abnormal Papanicolaou smear of  cervix with positive human papilloma virus (HPV) test 08/17/2015  . Anticoagulation goal of INR 2.5 to 3.5   . Anxiety   . B12 deficiency   . BRCA1 negative   . BRCA2 negative   . Breast cancer, left breast ([place]) 2011   S/P mastectomy; chemo; "no radiation due to lupus"  . Bursitis of right knee    Septic bursitis  . Chronic lower back pain   . Drug-induced hepatitis    States per her rheumatologist, transaminases elevated but normalized after drug removed for   . Fibromyalgia   . GERD (gastroesophageal reflux disease)   . 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 History of abnormal cervical Pap smear 08/12/2015  . History of blood transfusion   . HSIL (high grade squamous intraepithelial lesion) on Pap smear of cervix 01/14/2017   colpo with Dr Elonda Husky   . Hypothyroidism   . Mitral valve disease, rheumatic    St. Jude prosthesis  . Peripheral neuropathy   . Pneumonia   . Raynaud phenomenon   . Rheumatoid arthritis ([place])   . SLE (systemic lupus erythematosus) ([place])     Past Surgical History:  Procedure Laterality Date  . APPENDECTOMY N/A 05/23/2016   Procedure: OPEN APPENDECTOMY WITH DRAINAGE OF ABSCESS;  Surgeon: Fanny Skates, MD;  Location: [facility];  Service: General;  Laterality: N/A;  . APPENDECTOMY    . BREAST BIOPSY Left 2012  . BREAST IMPLANT EXCHANGE Left 12/2015  . CARDIAC CATHETERIZATION  2008  . CARDIAC VALVE REPLACEMENT    . CERVICAL CONIZATION W/BX N/A 03/06/2017   Procedure: Laser Conization of Cervix;  Surgeon: Florian Buff, MD;  Location: [facility];  Service: Gynecology;  Laterality: N/A;  . CORNEAL TRANSPLANT Left   . ENDOMETRIAL ABLATION  2008   She no longer has menses  . ESOPHAGOGASTRODUODENOSCOPY N/A 03/25/2013   Dr. Raliegh Scarlet reflux esophagitis-likely source of patient's symptoms (patulous EG junction). Hiatal hernia otherwise normal  . LAPAROSCOPIC CHOLECYSTECTOMY    . LIVER BIOPSY  11/28/2016   liver core/notes 11/28/2016   . MASTECTOMY Left 2012  . MASTOPEXY  02/14/2011   Procedure: MASTOPEXY;  Surgeon: Macon Large;  Location: [facility];  Service: Plastics;  Laterality: Bilateral;  Right Breast Reduction   . MITRAL VALVE REPLACEMENT  2008  . TISSUE EXPANDER PLACEMENT  02/14/2011   Procedure: TISSUE EXPANDER;  Surgeon: Macon Large;  Location: [facility];  Service: Plastics;  Laterality: Bilateral;  Left Breast Remove Tissue Expander Placement of Implant Breast Reconstruction  . TISSUE EXPANDER PLACEMENT Left 11/16/2010   breasst  . TUBAL LIGATION      Social History:  reports that she quit smoking about 13 years ago. Her smoking use included cigarettes. She has a 15.00 pack-year smoking history. She has never used smokeless tobacco. She reports previous alcohol use. She reports that she does not use drugs.  Allergies  Allergen Reactions  . Methotrexate Derivatives Other (See Comments)    Raised liver enzymes   . 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 kph    Family History  Problem Relation Age of Onset  . Hypertension Mother   . Hyperlipidemia Mother   . Hypertension Father   . Colon cancer Neg Hx   . Colon polyps Neg Hx      Prior to Admission medications   Medication Sig Start Date End Date Taking? Authorizing Provider  albuterol (VENTOLIN HFA) 108 (90 Base) MCG/ACT inhaler Inhale 1-2 puffs into the lungs every 6 (six) hours as needed for wheezing.  05/07/19  Yes [provider]  bromfenac (XIBROM) 0.09 % ophthalmic solution Place 1 drop into the left eye.  06/23/19  Yes [provider]  buPROPion (WELLBUTRIN XL) 300 MG 24 hr tablet Take 300 mg by mouth daily. 10/21/19  Yes [provider]  DEXILANT 60 MG capsule TAKE ONE CAPSULE BY MOUTH DAILY. Patient taking differently: Take 60 mg by mouth daily.  09/25/19  Yes Mahala Menghini, PA-C  famotidine (PEPCID) 40 MG tablet TAKE (1)  TABLET BY MOUTH AT BEDTIME. Patient taking differently: Take 40 mg by mouth at bedtime.  05/21/19  Yes Harper, Kristen S, PA-C  furosemide (LASIX) 40 MG tablet TAKE ONE TABLET BY MOUTH ONCE DAILY. Patient taking differently: Take 40 mg by mouth daily.  08/04/19  Yes Satira Sark, MD  ibuprofen (ADVIL,MOTRIN) 800 MG tablet Take 800 mg by mouth 2 (two) times daily as needed for mild pain or moderate pain. Reported on 08/30/2015   Yes [provider]  levothyroxine (SYNTHROID) 75 MCG tablet Take 1 tablet (75 mcg total) by mouth daily before breakfast. 07/15/19  Yes Nida, Marella Chimes, MD  metoCLOPramide (REGLAN) 5 MG tablet TAKE (1) TABLET BY MOUTH TWICE DAILY. Patient taking differently: Take 5 mg by mouth in the morning and at bedtime.  02/03/19  Yes Annitta Needs, NP  nepafenac (NEVANAC) 0.1 % ophthalmic suspension Place 1 drop into the left eye daily.  06/19/19  Yes [provider]  ondansetron (ZOFRAN) 4 MG tablet Take 1 tablet (  4 mg total) by mouth every 8 (eight) hours as needed for nausea or vomiting. 10/09/17  Yes Annitta Needs, NP  potassium chloride (KLOR-CON 10) 10 MEQ tablet Take 10 mEq by mouth every evening. Take one tablet daily as directed 11/09/13  Yes [provider]  prednisoLONE acetate (PRED FORTE) 1 % ophthalmic suspension Place 1 drop into the left eye daily.  09/28/19  Yes [provider]  promethazine (PHENERGAN) 12.5 MG tablet Take 1 tablet (12.5 mg total) by mouth every 6 (six) hours as needed for nausea or vomiting. 10/09/17  Yes Annitta Needs, NP  riTUXimab (RITUXAN) 100 MG/10ML injection Inject into the vein. Every 4 months   Yes [provider]  rOPINIRole (REQUIP) 1 MG tablet Take 1 mg by mouth at bedtime.   Yes [provider]  warfarin (COUMADIN) 5 MG tablet TAKE 1 & 1/2 TABLETS BY MOUTH DAILY OR AS DIRECTED Patient taking differently: Take 7.5 mg by mouth daily.  01/12/19  Yes Satira Sark, MD  zolpidem  (AMBIEN) 10 MG tablet Take 10 mg by mouth at bedtime.  12/19/10  Yes de Stanford Scotland, MD  cyanocobalamin (,VITAMIN B-12,) 1000 MCG/ML injection Inject 1 mL (1,000 mcg total) into the muscle every 30 (thirty) days. Patient not taking: Reported on 11/09/2019 12/19/10   Everardo All, MD  oxyCODONE-acetaminophen (PERCOCET/ROXICET) 5-325 MG tablet Take 1-2 tablets by mouth every 4 (four) hours as needed for severe pain. Patient not taking: Reported on 11/09/2019 03/06/17   Florian Buff, MD    Physical Exam: Vitals:   11/09/19 1400 11/09/19 1415 11/09/19 1430 11/09/19 1445  BP: (!) 82/49 (!) 81/59 (!) 99/56 104/69  Pulse: 91 90 86 85  Resp: 17 18 15  (!) 21  Temp:      TempSrc:      SpO2: 96% (!) 87% 95% 94%  Weight:      Height:        Constitutional: NAD, calm, comfortable Eyes: PERRL, lids and conjunctivae normal ENMT: Mucous membranes are moist. Posterior pharynx clear of any exudate or lesions.Normal dentition.  Neck: normal, supple, no masses, no thyromegaly Respiratory: clear to auscultation bilaterally, no wheezing, no crackles. Normal respiratory effort. No accessory muscle use.  Cardiovascular: Regular rate and rhythm, no murmurs / rubs / gallops. No extremity edema. 2+ pedal pulses. No carotid bruits.  Abdomen: no tenderness, no masses palpated. No hepatosplenomegaly. Bowel sounds positive.  Musculoskeletal: no clubbing / cyanosis. No joint deformity upper and lower extremities. Good ROM, no contractures. Normal muscle tone.  Skin: no rashes, lesions, ulcers. No induration Neurologic: CN 2-12 grossly intact. Sensation intact, DTR normal. Strength 5/5 in all 4.  Psychiatric: Normal judgment and insight. Alert and oriented x 3. Normal mood.    Labs on Admission: I have personally reviewed following labs and imaging studies  CBC: Recent Labs  Lab 11/09/19 1015  WBC 6.3  HGB 13.1  HCT 37.5  MCV 88.0  PLT 161   Basic Metabolic Panel: Recent Labs  Lab 11/09/19 1015  11/09/19 1405  NA 136  --   K 2.5*  --   CL 99  --   CO2 24  --   GLUCOSE 87  --   BUN 11  --   CREATININE 1.17*  --   CALCIUM 8.5*  --   MG  --  1.3*   GFR: Estimated Creatinine Clearance: 54.4 mL/min (A) (by C-G formula based on SCr of 1.17 mg/dL (H)). Liver Function Tests:  Recent Labs  Lab 11/09/19 1015  AST 30  ALT 24  ALKPHOS 61  BILITOT 1.0  PROT 7.3  ALBUMIN 4.1   No results for input(s): LIPASE, AMYLASE in the last 168 hours. No results for input(s): AMMONIA in the last 168 hours. Coagulation Profile: Recent Labs  Lab 11/05/19 1105 11/09/19 1015  INR 7.1* 2.1*   Cardiac Enzymes: No results for input(s): CKTOTAL, CKMB, CKMBINDEX, TROPONINI in the last 168 hours. BNP (last 3 results) No results for input(s): PROBNP in the last 8760 hours. HbA1C: No results for input(s): HGBA1C in the last 72 hours. CBG: No results for input(s): GLUCAP in the last 168 hours. Lipid Profile: Recent Labs    11/09/19 1015  TRIG 115   Thyroid Function Tests: No results for input(s): TSH, T4TOTAL, FREET4, T3FREE, THYROIDAB in the last 72 hours. Anemia Panel: Recent Labs    11/09/19 1015  FERRITIN 474*   Urine analysis:    Component Value Date/Time   COLORURINE YELLOW 11/09/2019 0846   APPEARANCEUR HAZY (A) 11/09/2019 0846   LABSPEC 1.014 11/09/2019 0846   PHURINE 6.0 11/09/2019 0846   GLUCOSEU NEGATIVE 11/09/2019 0846   HGBUR NEGATIVE 11/09/2019 0846   BILIRUBINUR NEGATIVE 11/09/2019 0846   KETONESUR 5 (A) 11/09/2019 0846   PROTEINUR 30 (A) 11/09/2019 0846   UROBILINOGEN 0.2 09/06/2010 1600   NITRITE NEGATIVE 11/09/2019 0846   LEUKOCYTESUR LARGE (A) 11/09/2019 0846    Radiological Exams on Admission: CT Head Wo Contrast  Result Date: 11/09/2019 CLINICAL DATA:  Pain following recent fall EXAM: CT HEAD WITHOUT CONTRAST TECHNIQUE: Contiguous axial images were obtained from the base of the skull through the vertex without intravenous contrast. COMPARISON:  None.  FINDINGS: Brain: The ventricles and sulci are normal in size and configuration. There is no intracranial mass, hemorrhage, extra-axial fluid collection, or midline shift. The brain parenchyma appears normal. There is no demonstrable acute infarct. Vascular: No hyperdense vessel. No appreciable vascular calcification. Skull: The bony calvarium appears intact. Sinuses/Orbits: There is mucosal thickening in the maxillary antra bilaterally, more severe on the right than on the left. There is opacification of multiple ethmoid air cells. There is opacification in the frontal sinuses bilaterally. Orbits appear symmetric bilaterally. Other: The mastoid air cells are clear. IMPRESSION: Brain parenchyma appears unremarkable.  No mass or hemorrhage. Multifocal paranasal sinus disease noted. Electronically Signed   By: Lowella Grip III M.D.   On: 11/09/2019 09:39   DG Chest Port 1 View  Result Date: 11/09/2019 CLINICAL DATA:  Cough and fever for the past week. EXAM: PORTABLE CHEST 1 VIEW COMPARISON:  Chest x-ray dated May 21, 2016. FINDINGS: The heart size and mediastinal contours are within normal limits. Normal pulmonary vascularity. Similar retained epicardial pacing wires overlying the right chest. New patchy opacities at the peripheral right lung base. No focal consolidation, pleural effusion, or pneumothorax. No acute osseous abnormality. IMPRESSION: 1. Right lower lobe pneumonia with appearance concerning for atypical infection, including viral pneumonia. Electronically Signed   By: Titus Dubin M.D.   On: 11/09/2019 09:04    EKG: Independently reviewed.  Sinus rhythm without acute changes  Assessment/Plan Active Problems:   History of mitral valve replacement with mechanical valve   Systemic lupus erythematosus ([place])   Long term current use of anticoagulant therapy   Gastroparesis   Primary hypothyroidism   Pneumonia due to COVID-19 virus   Hypokalemia     Pneumonia due to COVID-19 -Chest  x-ray consistent with pneumonia -She is febrile and has  elevated inflammatory markers -She is chronically immunosuppressed -Started on remdesivir and intravenous steroids -Continue to follow inflammatory markers -Currently on room air, will monitor for developing hypoxia  Vomiting, diarrhea -Secondary to COVID-19 -Treat supportively with antiemetics  Hypokalemia/hypomagnesemia -Secondary to GI losses -Replace  Hypothyroidism -Continue Synthroid  Systemic lupus erythematosus -Chronically on rituximab once every 4 months -Last infusion was approximately 1 week ago -Continue to follow-up with rheumatology  Gastroparesis -Continue on home dose of metoclopramide  Mechanical mitral valve on chronic anticoagulation -INR subtherapeutic at 2.1 -Continue on Coumadin with Lovenox bridge -Goal INR is 2.5-3.5 -She has not taken Coumadin in several days since INR was supratherapeutic.  Anticipate that INR will continue to decrease despite restarting Coumadin, therefore will start Lovenox bridge  DVT prophylaxis: Lovenox/Coumadin Code Status: Full code Family Communication: Updated daughter over the phone Disposition Plan: Discharge home once symptoms have improved Consults called:   Admission status: Observation, telemetry  Kathie Dike MD [facility]   If 7PM-7AM, please contact night-coverage [email or web address]   11/09/2019, 3:33 PM

## 2019-11-10 DIAGNOSIS — D849 Immunodeficiency, unspecified: Secondary | ICD-10-CM | POA: Diagnosis present

## 2019-11-10 DIAGNOSIS — J1282 Pneumonia due to coronavirus disease 2019: Secondary | ICD-10-CM | POA: Diagnosis present

## 2019-11-10 DIAGNOSIS — Z7901 Long term (current) use of anticoagulants: Secondary | ICD-10-CM | POA: Diagnosis not present

## 2019-11-10 DIAGNOSIS — Z87891 Personal history of nicotine dependence: Secondary | ICD-10-CM | POA: Diagnosis not present

## 2019-11-10 DIAGNOSIS — E039 Hypothyroidism, unspecified: Secondary | ICD-10-CM | POA: Diagnosis present

## 2019-11-10 DIAGNOSIS — M069 Rheumatoid arthritis, unspecified: Secondary | ICD-10-CM | POA: Diagnosis present

## 2019-11-10 DIAGNOSIS — U071 COVID-19: Secondary | ICD-10-CM | POA: Diagnosis present

## 2019-11-10 DIAGNOSIS — Z79890 Hormone replacement therapy (postmenopausal): Secondary | ICD-10-CM | POA: Diagnosis not present

## 2019-11-10 DIAGNOSIS — K3184 Gastroparesis: Secondary | ICD-10-CM | POA: Diagnosis present

## 2019-11-10 DIAGNOSIS — I951 Orthostatic hypotension: Secondary | ICD-10-CM | POA: Diagnosis present

## 2019-11-10 DIAGNOSIS — Z9012 Acquired absence of left breast and nipple: Secondary | ICD-10-CM | POA: Diagnosis not present

## 2019-11-10 DIAGNOSIS — M329 Systemic lupus erythematosus, unspecified: Secondary | ICD-10-CM | POA: Diagnosis present

## 2019-11-10 DIAGNOSIS — Z9221 Personal history of antineoplastic chemotherapy: Secondary | ICD-10-CM | POA: Diagnosis not present

## 2019-11-10 DIAGNOSIS — M328 Other forms of systemic lupus erythematosus: Secondary | ICD-10-CM

## 2019-11-10 DIAGNOSIS — Z853 Personal history of malignant neoplasm of breast: Secondary | ICD-10-CM | POA: Diagnosis not present

## 2019-11-10 DIAGNOSIS — Z79899 Other long term (current) drug therapy: Secondary | ICD-10-CM | POA: Diagnosis not present

## 2019-11-10 DIAGNOSIS — E876 Hypokalemia: Secondary | ICD-10-CM | POA: Diagnosis present

## 2019-11-10 DIAGNOSIS — Z952 Presence of prosthetic heart valve: Secondary | ICD-10-CM | POA: Diagnosis not present

## 2019-11-10 DIAGNOSIS — K219 Gastro-esophageal reflux disease without esophagitis: Secondary | ICD-10-CM | POA: Diagnosis present

## 2019-11-10 DIAGNOSIS — M797 Fibromyalgia: Secondary | ICD-10-CM | POA: Diagnosis present

## 2019-11-10 LAB — CBC WITH DIFFERENTIAL/PLATELET
Abs Immature Granulocytes: 0.02 10*3/uL (ref 0.00–0.07)
Basophils Absolute: 0 10*3/uL (ref 0.0–0.1)
Basophils Relative: 0 %
Eosinophils Absolute: 0 10*3/uL (ref 0.0–0.5)
Eosinophils Relative: 0 %
HCT: 32.4 % — ABNORMAL LOW (ref 36.0–46.0)
Hemoglobin: 10.5 g/dL — ABNORMAL LOW (ref 12.0–15.0)
Immature Granulocytes: 1 %
Lymphocytes Relative: 16 %
Lymphs Abs: 0.4 10*3/uL — ABNORMAL LOW (ref 0.7–4.0)
MCH: 30.3 pg (ref 26.0–34.0)
MCHC: 32.4 g/dL (ref 30.0–36.0)
MCV: 93.4 fL (ref 80.0–100.0)
Monocytes Absolute: 0.2 10*3/uL (ref 0.1–1.0)
Monocytes Relative: 7 %
Neutro Abs: 2 10*3/uL (ref 1.7–7.7)
Neutrophils Relative %: 76 %
Platelets: 176 10*3/uL (ref 150–400)
RBC: 3.47 MIL/uL — ABNORMAL LOW (ref 3.87–5.11)
RDW: 13.9 % (ref 11.5–15.5)
WBC: 2.6 10*3/uL — ABNORMAL LOW (ref 4.0–10.5)
nRBC: 0 % (ref 0.0–0.2)

## 2019-11-10 LAB — COMPREHENSIVE METABOLIC PANEL
ALT: 18 U/L (ref 0–44)
AST: 19 U/L (ref 15–41)
Albumin: 3.1 g/dL — ABNORMAL LOW (ref 3.5–5.0)
Alkaline Phosphatase: 48 U/L (ref 38–126)
Anion gap: 9 (ref 5–15)
BUN: 11 mg/dL (ref 6–20)
CO2: 22 mmol/L (ref 22–32)
Calcium: 8.1 mg/dL — ABNORMAL LOW (ref 8.9–10.3)
Chloride: 108 mmol/L (ref 98–111)
Creatinine, Ser: 0.64 mg/dL (ref 0.44–1.00)
GFR calc Af Amer: >60 mL/min (ref >60)
GFR calc non Af Amer: >60 mL/min (ref >60)
Glucose, Bld: 121 mg/dL — ABNORMAL HIGH (ref 70–99)
Potassium: 3.9 mmol/L (ref 3.5–5.1)
Sodium: 139 mmol/L (ref 135–145)
Total Bilirubin: 0.7 mg/dL (ref 0.3–1.2)
Total Protein: 5.9 g/dL — ABNORMAL LOW (ref 6.5–8.1)

## 2019-11-10 LAB — FERRITIN: Ferritin: 404 ng/mL — ABNORMAL HIGH (ref 11–307)

## 2019-11-10 LAB — MAGNESIUM: Magnesium: 1.5 mg/dL — ABNORMAL LOW (ref 1.7–2.4)

## 2019-11-10 LAB — D-DIMER, QUANTITATIVE: D-Dimer, Quant: 0.28 ug/mL-FEU (ref 0.00–0.50)

## 2019-11-10 LAB — PROTIME-INR
INR: 2.3 — ABNORMAL HIGH (ref 0.8–1.2)
Prothrombin Time: 24.8 seconds — ABNORMAL HIGH (ref 11.4–15.2)

## 2019-11-10 LAB — HIV ANTIBODY (ROUTINE TESTING W REFLEX): HIV Screen 4th Generation wRfx: NONREACTIVE

## 2019-11-10 MED ORDER — ALBUTEROL SULFATE HFA 108 (90 BASE) MCG/ACT IN AERS
2.0000 | INHALATION_SPRAY | Freq: Three times a day (TID) | RESPIRATORY_TRACT | Status: DC
  Administered 2019-11-11: 2 via RESPIRATORY_TRACT

## 2019-11-10 MED ORDER — METOCLOPRAMIDE HCL 10 MG PO TABS
5.0000 mg | ORAL_TABLET | Freq: Three times a day (TID) | ORAL | Status: DC
  Administered 2019-11-10 – 2019-11-11 (×4): 5 mg via ORAL
  Filled 2019-11-10 (×4): qty 1

## 2019-11-10 MED ORDER — WARFARIN SODIUM 7.5 MG PO TABS
7.5000 mg | ORAL_TABLET | Freq: Once | ORAL | Status: AC
  Administered 2019-11-10: 7.5 mg via ORAL
  Filled 2019-11-10: qty 1

## 2019-11-10 MED ORDER — LACTATED RINGERS IV SOLN: INTRAVENOUS | Status: AC

## 2019-11-10 MED ORDER — LACTATED RINGERS IV BOLUS
1000.0000 mL | Freq: Once | INTRAVENOUS | Status: AC
  Administered 2019-11-10: 1000 mL via INTRAVENOUS

## 2019-11-10 MED ORDER — MAGNESIUM OXIDE 400 (241.3 MG) MG PO TABS
800.0000 mg | ORAL_TABLET | Freq: Two times a day (BID) | ORAL | Status: DC
  Administered 2019-11-10 – 2019-11-11 (×2): 800 mg via ORAL
  Filled 2019-11-10 (×2): qty 2

## 2019-11-10 MED ORDER — PROMETHAZINE HCL 25 MG/ML IJ SOLN
12.5000 mg | Freq: Four times a day (QID) | INTRAMUSCULAR | Status: DC | PRN
  Administered 2019-11-10 – 2019-11-11 (×4): 12.5 mg via INTRAVENOUS
  Filled 2019-11-10 (×4): qty 1

## 2019-11-10 NOTE — ED Notes (Signed)
Pt reports she was able to eat 50% of her meal without n/v; stood pt up and she reported she feels dizzy and weak, allowed pt time to recover, attempted to ambulate a short distance in the room, O2 sats decreased to 86% on RA;

## 2019-11-10 NOTE — ED Notes (Signed)
ED TO INPATIENT HANDOFF REPORT  ED Nurse Name and Phone #: [phone]  S Name/Age/Gender [name] 49 y.o. female Room/Bed: APA17/APA17  Code Status   Code Status: Full Code  Home/SNF/Other Home Patient oriented to: self, place, time and situation Is this baseline? Yes   Triage Complete: Triage complete  Chief Complaint Pneumonia due to COVID-19 virus [U07.1, J12.82]  Triage Note Pt states she fell about a week and a half ago and when she fell she said the back of her head felt "funny" and her legs felt like "jelly"; pt states she just feels bad; pt c/o some heart palpitations and a cough    Allergies Allergies  Allergen Reactions  . Methotrexate Derivatives Other (See Comments)    Raised liver enzymes   . 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 kph    Level of Care/Admitting Diagnosis ED Disposition    ED Disposition Condition [facility]: [facility] [[id number]]  Level of Care: Med-Surg [16]  Covid Evaluation: Confirmed COVID Positive  Diagnosis: Pneumonia due to COVID-19 virus [[id number]]  Admitting Physician: [facility], Gervais  Attending Physician: Kathie Dike [[id number]]  Estimated length of stay: past midnight tomorrow  Certification:: I certify this patient will need inpatient services for at least 2 midnights       B Medical/Surgery History Past Medical History:  Diagnosis Date  . Abnormal Papanicolaou smear of cervix with positive human papilloma virus (HPV) test 08/17/2015  . Anticoagulation goal of INR 2.5 to 3.5   . Anxiety   . B12 deficiency   . BRCA1 negative   . BRCA2 negative   . Breast cancer, left breast ([place]) 2011   S/P mastectomy; chemo; "no radiation due to lupus"  . Bursitis of right knee    Septic bursitis  . Chronic lower back pain   . Drug-induced hepatitis    States per her rheumatologist,  transaminases elevated but normalized after drug removed for   . Fibromyalgia   . GERD (gastroesophageal reflux disease)   . 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 History of abnormal cervical Pap smear 08/12/2015  . History of blood transfusion   . HSIL (high grade squamous intraepithelial lesion) on Pap smear of cervix 01/14/2017   colpo with Dr Elonda Husky   . Hypothyroidism   . Mitral valve disease, rheumatic    St. Jude prosthesis  . Peripheral neuropathy   . Pneumonia   . Raynaud phenomenon   . Rheumatoid arthritis ([place])   . SLE (systemic lupus erythematosus) ([place])    Past Surgical History:  Procedure Laterality Date  . APPENDECTOMY N/A 05/23/2016   Procedure: OPEN APPENDECTOMY WITH DRAINAGE OF ABSCESS;  Surgeon: Fanny Skates, MD;  Location: [facility];  Service: General;  Laterality: N/A;  . APPENDECTOMY    . BREAST BIOPSY Left 2012  . BREAST IMPLANT EXCHANGE Left 12/2015  . CARDIAC CATHETERIZATION  2008  . CARDIAC VALVE REPLACEMENT    . CERVICAL CONIZATION W/BX N/A 03/06/2017   Procedure: Laser Conization of Cervix;  Surgeon: Florian Buff, MD;  Location: [facility];  Service: Gynecology;  Laterality: N/A;  . CORNEAL TRANSPLANT Left   . ENDOMETRIAL ABLATION  2008   She no longer has menses  . ESOPHAGOGASTRODUODENOSCOPY N/A 03/25/2013   Dr. Raliegh Scarlet reflux esophagitis-likely source of patient's symptoms (patulous EG junction).  Hiatal hernia otherwise normal  . LAPAROSCOPIC CHOLECYSTECTOMY    . LIVER BIOPSY  11/28/2016   liver core/notes 11/28/2016  . MASTECTOMY Left 2012  . MASTOPEXY  02/14/2011   Procedure: MASTOPEXY;  Surgeon: Macon Large;  Location: [facility];  Service: Plastics;  Laterality: Bilateral;  Right Breast Reduction   . MITRAL VALVE REPLACEMENT  2008  . TISSUE EXPANDER PLACEMENT  02/14/2011   Procedure: TISSUE EXPANDER;  Surgeon: Macon Large;  Location: [facility];  Service: Plastics;  Laterality: Bilateral;  Left Breast Remove Tissue  Expander Placement of Implant Breast Reconstruction  . TISSUE EXPANDER PLACEMENT Left 11/16/2010   breasst  . TUBAL LIGATION       A IV Location/Drains/Wounds Patient Lines/Drains/Airways Status    Active Line/Drains/Airways    Name Placement date Placement time Site Days   Peripheral IV 03/06/17 Right Wrist 03/06/17  0939  Wrist  979   Peripheral IV 11/09/19 Right Forearm 11/09/19  1304  Forearm  1   Negative Pressure Wound Therapy Abdomen Medial 05/24/16  1030  --  1265   Incision (Closed) 05/23/16 Abdomen Other (Comment) 05/23/16  1658   1266   Wound / Incision (Open or Dehisced) 05/23/16 Abdomen Mid 05/23/16  1640  Abdomen  1266   Wound / Incision (Open or Dehisced) 11/28/16 Puncture Abdomen Right 11/28/16  1450  Abdomen  1077          Intake/Output Last 24 hours  Intake/Output Summary (Last 24 hours) at 11/10/2019 1612 Last data filed at 11/10/2019 1002 Gross per 24 hour  Intake 100 ml  Output --  Net 100 ml    Labs/Imaging Results for orders placed or performed during the hospital encounter of 11/09/19 (from the past 48 hour(s))  SARS Coronavirus 2 by RT PCR (hospital order, performed in [place] hospital lab) Nasopharyngeal Nasopharyngeal Swab     Status: Abnormal   Collection Time: 11/09/19  8:39 AM   Specimen: Nasopharyngeal Swab  Result Value Ref Range   SARS Coronavirus 2 POSITIVE (A) NEGATIVE    Comment: RESULT CALLED TO, READ BACK BY AND VERIFIED WITH: TALBOTT, TINA AT 3536 ON 11/09/2019 BY BAUGHAM,M. (NOTE) SARS-CoV-2 target nucleic acids are DETECTED  SARS-CoV-2 RNA is generally detectable in upper respiratory specimens  during the acute phase of infection.  Positive results are indicative  of the presence of the identified virus, but do not rule out bacterial infection or co-infection with other pathogens not detected by the test.  Clinical correlation with patient history and  other diagnostic information is necessary to determine patient infection  status.  The expected result is negative.  Fact Sheet for Patients:   StrictlyIdeas.no   Fact Sheet for Healthcare Providers:   BankingDealers.co.za    This test is not yet approved or cleared by the [place] FDA and  has been authorized for detection and/or diagnosis of SARS-CoV-2 by FDA under an Emergency Use Authorization (EUA).  This EUA will remain in effect ( meaning this test can be used) for the duration of  the COVID-19 declaration under Section 564(b)(1) of the Act, 21 U.S.C. section 360-bbb-3(b)(1), unless the authorization is terminated or revoked sooner.  Performed at [facility], [address], [place], [place] [address]   Urinalysis, Routine w reflex microscopic Urine, Clean Catch     Status: Abnormal   Collection Time: 11/09/19  8:46 AM  Result Value Ref Range   Color, Urine YELLOW YELLOW   APPearance HAZY (A) CLEAR   Specific Gravity,  Urine 1.014 1.005 - 1.030   pH 6.0 5.0 - 8.0   Glucose, UA NEGATIVE NEGATIVE mg/dL   Hgb urine dipstick NEGATIVE NEGATIVE   Bilirubin Urine NEGATIVE NEGATIVE   Ketones, ur 5 (A) NEGATIVE mg/dL   Protein, ur 30 (A) NEGATIVE mg/dL   Nitrite NEGATIVE NEGATIVE   Leukocytes,Ua LARGE (A) NEGATIVE   RBC / HPF 0-5 0 - 5 RBC/hpf   WBC, UA 11-20 0 - 5 WBC/hpf   Bacteria, UA RARE (A) NONE SEEN   Squamous Epithelial / LPF 0-5 0 - 5   Mucus PRESENT    Hyaline Casts, UA PRESENT    Uric Acid Crys, UA PRESENT     Comment: Performed at [facility], [address] [place], [place] [address]  CBC     Status: None   Collection Time: 11/09/19 10:15 AM  Result Value Ref Range   WBC 6.3 4.0 - 10.5 K/uL   RBC 4.26 3.87 - 5.11 MIL/uL   Hemoglobin 13.1 12.0 - 15.0 g/dL   HCT 37.5 36 - 46 %   MCV 88.0 80.0 - 100.0 fL   MCH 30.8 26.0 - 34.0 pg   MCHC 34.9 30.0 - 36.0 g/dL   RDW 13.3 11.5 - 15.5 %   Platelets 204 150 - 400 K/uL   nRBC 0.0 0.0 - 0.2 %    Comment: Performed at [facility], [address], [place], [place] [address]  Comprehensive metabolic panel     Status: Abnormal   Collection Time: 11/09/19 10:15 AM  Result Value Ref Range   Sodium 136 135 - 145 mmol/L   Potassium 2.5 (LL) 3.5 - 5.1 mmol/L    Comment: CRITICAL RESULT CALLED TO, READ BACK BY AND VERIFIED WITH: TALBOTT,T AT 10:50AM ON 11/09/19 BY FESTERMAN,C    Chloride 99 98 - 111 mmol/L   CO2 24 22 - 32 mmol/L   Glucose, Bld 87 70 - 99 mg/dL    Comment: Glucose reference range applies only to samples taken after fasting for at least 8 hours.   BUN 11 6 - 20 mg/dL   Creatinine, Ser 1.17 (H) 0.44 - 1.00 mg/dL   Calcium 8.5 (L) 8.9 - 10.3 mg/dL   Total Protein 7.3 6.5 - 8.1 g/dL   Albumin 4.1 3.5 - 5.0 g/dL   AST 30 15 - 41 U/L   ALT 24 0 - 44 U/L   Alkaline Phosphatase 61 38 - 126 U/L   Total Bilirubin 1.0 0.3 - 1.2 mg/dL   GFR calc non Af Amer 54 (L) >60 mL/min   GFR calc Af Amer >60 >60 mL/min   Anion gap 13 5 - 15    Comment: Performed at [facility], [address] [place], [place] [address]  Protime-INR     Status: Abnormal   Collection Time: 11/09/19 10:15 AM  Result Value Ref Range   Prothrombin Time 22.6 (H) 11.4 - 15.2 seconds   INR 2.1 (H) 0.8 - 1.2    Comment: (NOTE) INR goal varies based on device and disease states. Performed at [facility], [address], [place],  [address]   Lactic acid, plasma     Status: None   Collection Time: 11/09/19 10:15 AM  Result Value Ref Range   Lactic Acid, Venous 1.3 0.5 - 1.9 mmol/L    Comment: Performed at [facility], [address], [place],  [address]  Blood culture (routine x 2)     Status: None (Preliminary result)  Collection Time: 11/09/19 10:15 AM   Specimen: BLOOD RIGHT FOREARM  Result Value Ref Range   Specimen Description BLOOD RIGHT FOREARM    Special Requests      BOTTLES DRAWN AEROBIC AND ANAEROBIC Blood Culture results may not be optimal due to an inadequate volume of blood received in culture bottles    Culture      NO GROWTH < 24 HOURS Performed at [facility], [address], [place], [place] [address]    Report Status PENDING   Blood culture (routine x 2)     Status: None (Preliminary result)   Collection Time: 11/09/19 10:15 AM   Specimen: BLOOD RIGHT HAND  Result Value Ref Range   Specimen Description BLOOD RIGHT HAND    Special Requests      BOTTLES DRAWN AEROBIC AND ANAEROBIC Blood Culture results may not be optimal due to an inadequate volume of blood received in culture bottles   Culture      NO GROWTH < 24 HOURS Performed at [facility], [address] [place], [place] [address]    Report Status PENDING   D-dimer, quantitative     Status: None   Collection Time: 11/09/19 10:15 AM  Result Value Ref Range   D-Dimer, Quant 0.41 0.00 - 0.50 ug/mL-FEU    Comment: (NOTE) At the manufacturer cut-off of 0.50 ug/mL FEU, this assay has been documented to exclude PE with a sensitivity and negative predictive value of 97 to 99%.  At this time, this assay has not been approved by the FDA to exclude DVT/VTE. Results should be correlated with clinical presentation. Performed at [facility], [address] [place],  [address]   Procalcitonin     Status: None   Collection Time: 11/09/19 10:15 AM  Result Value Ref Range   Procalcitonin <0.10 ng/mL    Comment:        Interpretation: PCT (Procalcitonin) <= 0.5 ng/mL: Systemic infection (sepsis) is not likely. Local bacterial infection is possible. (NOTE)       Sepsis PCT Algorithm           Lower Respiratory Tract                                      Infection PCT Algorithm    ----------------------------     ----------------------------         PCT < 0.25 ng/mL                PCT < 0.10 ng/mL          Strongly encourage             Strongly discourage   discontinuation of antibiotics    initiation of antibiotics    ----------------------------     -----------------------------       PCT 0.25 - 0.50 ng/mL            PCT  0.10 - 0.25 ng/mL               OR       >80% decrease in PCT            Discourage initiation of                                            antibiotics  Encourage discontinuation           of antibiotics    ----------------------------     -----------------------------         PCT >= 0.50 ng/mL              PCT 0.26 - 0.50 ng/mL               AND        <80% decrease in PCT             Encourage initiation of                                             antibiotics       Encourage continuation           of antibiotics    ----------------------------     -----------------------------        PCT >= 0.50 ng/mL                  PCT > 0.50 ng/mL               AND         increase in PCT                  Strongly encourage                                      initiation of antibiotics    Strongly encourage escalation           of antibiotics                                     -----------------------------                                           PCT <= 0.25 ng/mL                                                 OR                                        > 80% decrease in PCT                                      Discontinue / Do not initiate                                             antibiotics  Performed at [facility], [address] [place], [place] [address]   Lactate dehydrogenase     Status: Abnormal   Collection Time: 11/09/19 10:15 AM  Result Value Ref Range  LDH 607 (H) 98 - 192 U/L    Comment: Performed at [facility], [address] [place], [place] [address]  Ferritin     Status: Abnormal   Collection Time: 11/09/19 10:15 AM  Result Value Ref Range   Ferritin 474 (H) 11 - 307 ng/mL    Comment: Performed at [facility], [address] [place], [place] [address]  Triglycerides     Status: None   Collection Time: 11/09/19 10:15 AM  Result Value Ref Range   Triglycerides 115 <150 mg/dL    Comment: Performed at [facility], [address], [place], [place]  [address]  Fibrinogen     Status: Abnormal   Collection Time: 11/09/19 10:15 AM  Result Value Ref Range   Fibrinogen 547 (H) 210 - 475 mg/dL    Comment: Performed at [facility], [address], [place], [place] [address]  C-reactive protein     Status: Abnormal   Collection Time: 11/09/19 10:15 AM  Result Value Ref Range   CRP 3.0 (H) <1.0 mg/dL    Comment: Performed at [facility], [address], [place], [place] [address]  Lactic acid, plasma     Status: None   Collection Time: 11/09/19  2:05 PM  Result Value Ref Range   Lactic Acid, Venous 0.9 0.5 - 1.9 mmol/L    Comment: Performed at [facility], [address] [place], [place] [address]  Troponin I (High Sensitivity)     Status: None   Collection Time: 11/09/19  2:05 PM  Result Value Ref Range   Troponin I (High Sensitivity) 10 <18 ng/L    Comment: (NOTE) Elevated high sensitivity troponin I (hsTnI) values and significant  changes across serial measurements may suggest ACS but many other  chronic and acute conditions are known to elevate hsTnI results.  Refer to the "Links" section for chest pain algorithms and additional  guidance. Performed at [facility], [address], [place], [place] [address]   Magnesium     Status: Abnormal   Collection Time: 11/09/19  2:05 PM  Result Value Ref Range   Magnesium 1.3 (L) 1.7 - 2.4 mg/dL    Comment: Performed at [facility], [address] [place], [place] [address]  Troponin I (High Sensitivity)     Status: None   Collection Time: 11/09/19  6:41 PM  Result Value Ref Range   Troponin I (High Sensitivity) 9 <18 ng/L    Comment: (NOTE) Elevated high sensitivity troponin I (hsTnI) values and significant  changes across serial measurements may suggest ACS but many other  chronic and acute conditions are known to elevate hsTnI results.  Refer to the "Links" section for chest pain algorithms and additional  guidance. Performed at [facility], [address] [place], [place]  [address]   CBC with Differential/Platelet     Status: Abnormal   Collection Time: 11/10/19  7:06 AM  Result Value Ref Range   WBC 2.6 (L) 4.0 - 10.5 K/uL   RBC 3.47 (L) 3.87 - 5.11 MIL/uL   Hemoglobin 10.5 (L) 12.0 - 15.0 g/dL   HCT 32.4 (L) 36 - 46 %   MCV 93.4 80.0 - 100.0 fL   MCH 30.3 26.0 - 34.0 pg   MCHC 32.4 30.0 - 36.0 g/dL   RDW 13.9 11.5 - 15.5 %   Platelets 176 150 - 400 K/uL   nRBC 0.0 0.0 - 0.2 %   Neutrophils Relative % 76 %   Neutro Abs 2.0 1.7 -  7.7 K/uL   Lymphocytes Relative 16 %   Lymphs Abs 0.4 (L) 0.7 - 4.0 K/uL   Monocytes Relative 7 %   Monocytes Absolute 0.2 0 - 1 K/uL   Eosinophils Relative 0 %   Eosinophils Absolute 0.0 0 - 0 K/uL   Basophils Relative 0 %   Basophils Absolute 0.0 0 - 0 K/uL   Immature Granulocytes 1 %   Abs Immature Granulocytes 0.02 0.00 - 0.07 K/uL    Comment: Performed at [facility], [address] [place], [place] [address]  Comprehensive metabolic panel     Status: Abnormal   Collection Time: 11/10/19  7:06 AM  Result Value Ref Range   Sodium 139 135 - 145 mmol/L   Potassium 3.9 3.5 - 5.1 mmol/L    Comment: DELTA CHECK NOTED   Chloride 108 98 - 111 mmol/L   CO2 22 22 - 32 mmol/L   Glucose, Bld 121 (H) 70 - 99 mg/dL    Comment: Glucose reference range applies only to samples taken after fasting for at least 8 hours.   BUN 11 6 - 20 mg/dL   Creatinine, Ser 0.64 0.44 - 1.00 mg/dL   Calcium 8.1 (L) 8.9 - 10.3 mg/dL   Total Protein 5.9 (L) 6.5 - 8.1 g/dL   Albumin 3.1 (L) 3.5 - 5.0 g/dL   AST 19 15 - 41 U/L   ALT 18 0 - 44 U/L   Alkaline Phosphatase 48 38 - 126 U/L   Total Bilirubin 0.7 0.3 - 1.2 mg/dL   GFR calc non Af Amer >60 >60 mL/min   GFR calc Af Amer >60 >60 mL/min   Anion gap 9 5 - 15    Comment: Performed at [facility], [address] [place], [place] [address]  D-dimer, quantitative (not at [facility])     Status: None   Collection Time: 11/10/19  7:06 AM  Result Value Ref Range   D-Dimer, Quant 0.28 0.00 - 0.50  ug/mL-FEU    Comment: (NOTE) At the manufacturer cut-off of 0.50 ug/mL FEU, this assay has been documented to exclude PE with a sensitivity and negative predictive value of 97 to 99%.  At this time, this assay has not been approved by the FDA to exclude DVT/VTE. Results should be correlated with clinical presentation. Performed at [facility], [address], [place], [place] [address]   Ferritin     Status: Abnormal   Collection Time: 11/10/19  7:06 AM  Result Value Ref Range   Ferritin 404 (H) 11 - 307 ng/mL    Comment: Performed at [facility], [address], [place], [place] [address]  Magnesium     Status: Abnormal   Collection Time: 11/10/19  7:06 AM  Result Value Ref Range   Magnesium 1.5 (L) 1.7 - 2.4 mg/dL    Comment: Performed at [facility], [address] [place], [place] [address]  Protime-INR     Status: Abnormal   Collection Time: 11/10/19  7:06 AM  Result Value Ref Range   Prothrombin Time 24.8 (H) 11.4 - 15.2 seconds   INR 2.3 (H) 0.8 - 1.2    Comment: (NOTE) INR goal varies based on device and disease states. Performed at [facility], [address], [place], [place] [address]   HIV Antibody (routine testing w rflx)     Status: None   Collection Time: 11/10/19  7:06 AM  Result Value Ref Range   HIV Screen 4th Generation wRfx Non Reactive Non Reactive  Comment: Performed at [facility], [place] [address] [place], [place] [address]   CT Head Wo Contrast  Result Date: 11/09/2019 CLINICAL DATA:  Pain following recent fall EXAM: CT HEAD WITHOUT CONTRAST TECHNIQUE: Contiguous axial images were obtained from the base of the skull through the vertex without intravenous contrast. COMPARISON:  None. FINDINGS: Brain: The ventricles and sulci are normal in size and configuration. There is no intracranial mass, hemorrhage, extra-axial fluid collection, or midline shift. The brain parenchyma appears normal. There is no demonstrable acute infarct. Vascular: No  hyperdense vessel. No appreciable vascular calcification. Skull: The bony calvarium appears intact. Sinuses/Orbits: There is mucosal thickening in the maxillary antra bilaterally, more severe on the right than on the left. There is opacification of multiple ethmoid air cells. There is opacification in the frontal sinuses bilaterally. Orbits appear symmetric bilaterally. Other: The mastoid air cells are clear. IMPRESSION: Brain parenchyma appears unremarkable.  No mass or hemorrhage. Multifocal paranasal sinus disease noted. Electronically Signed   By: Lowella Grip III M.D.   On: 11/09/2019 09:39   DG Chest Port 1 View  Result Date: 11/09/2019 CLINICAL DATA:  Cough and fever for the past week. EXAM: PORTABLE CHEST 1 VIEW COMPARISON:  Chest x-ray dated May 21, 2016. FINDINGS: The heart size and mediastinal contours are within normal limits. Normal pulmonary vascularity. Similar retained epicardial pacing wires overlying the right chest. New patchy opacities at the peripheral right lung base. No focal consolidation, pleural effusion, or pneumothorax. No acute osseous abnormality. IMPRESSION: 1. Right lower lobe pneumonia with appearance concerning for atypical infection, including viral pneumonia. Electronically Signed   By: Titus Dubin M.D.   On: 11/09/2019 09:04    Pending Labs Unresulted Labs (From admission, onward)          Start     Ordered   11/10/19 0500  CBC with Differential/Platelet  Daily,   R      11/09/19 1146   11/10/19 0500  Comprehensive metabolic panel  Daily,   R      11/09/19 1146   11/10/19 0500  D-dimer, quantitative (not at [facility])  Daily,   R      11/09/19 1146   11/10/19 0500  Ferritin  Daily,   R      11/09/19 1146   11/10/19 0500  Magnesium  Daily,   R      11/09/19 1146   11/10/19 0500  Protime-INR  Daily,   R      11/09/19 1250          Vitals/Pain Today's Vitals   11/10/19 1430 11/10/19 1510 11/10/19 1515 11/10/19 1530  BP: 123/75 99/68 (!) 86/61 (!)  94/56  Pulse: 75 74 71 73  Resp: (!) 21 20 18 16   Temp:      TempSrc:      SpO2: 93% 98% 97% 99%  Weight:      Height:      PainSc:    3     Isolation Precautions Airborne and Contact precautions  Medications Medications  oxyCODONE-acetaminophen (PERCOCET/ROXICET) 5-325 MG per tablet 1-2 tablet (1 tablet Oral Given 11/09/19 2126)  levothyroxine (SYNTHROID) tablet 75 mcg (75 mcg Oral Given 11/10/19 0555)  pantoprazole (PROTONIX) EC tablet 40 mg (40 mg Oral Given 11/10/19 0845)  famotidine (PEPCID) tablet 40 mg (40 mg Oral Given 11/09/19 2126)  rOPINIRole (REQUIP) tablet 1 mg (1 mg Oral Given 11/09/19 2126)  potassium chloride SA (KLOR-CON) CR tablet 10 mEq (10 mEq Oral Given  11/09/19 2126)  albuterol (VENTOLIN HFA) 108 (90 Base) MCG/ACT inhaler 2 puff (2 puffs Inhalation Given 11/10/19 1341)  dexamethasone (DECADRON) injection 6 mg (6 mg Intravenous Given 11/10/19 1203)  guaiFENesin-dextromethorphan (ROBITUSSIN DM) 100-10 MG/5ML syrup 10 mL (10 mLs Oral Given 11/10/19 1531)  chlorpheniramine-HYDROcodone (TUSSIONEX) 10-8 MG/5ML suspension 5 mL (5 mLs Oral Given 11/10/19 0845)  acetaminophen (TYLENOL) tablet 650 mg (650 mg Oral Given 11/10/19 1531)  ondansetron (ZOFRAN) tablet 4 mg ( Oral See Alternative 11/10/19 0601)    Or  ondansetron (ZOFRAN) injection 4 mg (4 mg Intravenous Given 11/10/19 0601)  zolpidem (AMBIEN) tablet 5 mg (5 mg Oral Given 11/09/19 2142)  remdesivir 100 mg in sodium chloride 0.9 % 100 mL IVPB (0 mg Intravenous Stopped 11/10/19 1002)  Warfarin - Pharmacist Dosing Inpatient ( Does not apply Given 11/09/19 2015)  magnesium sulfate IVPB 4 g 100 mL (4 g Intravenous Refused 11/09/19 2127)  enoxaparin (LOVENOX) injection 70 mg (70 mg Subcutaneous Given 11/10/19 0555)  buPROPion (WELLBUTRIN XL) 24 hr tablet 300 mg (300 mg Oral Given 11/10/19 0845)  promethazine (PHENERGAN) injection 12.5 mg (12.5 mg Intravenous Given 11/10/19 0844)  metoCLOPramide (REGLAN) tablet 5 mg (5 mg Oral Given  11/10/19 1203)  acetaminophen (TYLENOL) tablet 650 mg (650 mg Oral Given 11/09/19 1046)  sodium chloride 0.9 % bolus 500 mL (0 mLs Intravenous Stopped 11/09/19 1241)  ondansetron (ZOFRAN) injection 4 mg (4 mg Intravenous Given 11/09/19 1046)  cefTRIAXone (ROCEPHIN) 1 g in sodium chloride 0.9 % 100 mL IVPB (0 g Intravenous Stopped 11/09/19 1242)  azithromycin (ZITHROMAX) 500 mg in sodium chloride 0.9 % 250 mL IVPB (0 mg Intravenous Stopped 11/09/19 1428)  potassium chloride SA (KLOR-CON) CR tablet 40 mEq (40 mEq Oral Given 11/09/19 1239)  potassium chloride SA (KLOR-CON) CR tablet 40 mEq (40 mEq Oral Given 11/09/19 1439)  remdesivir 100 mg in sodium chloride 0.9 % 100 mL IVPB (0 mg Intravenous Stopped 11/09/19 1358)  potassium chloride 10 mEq in 100 mL IVPB (0 mEq Intravenous Stopped 11/09/19 2008)  sodium chloride 0.9 % bolus 500 mL (0 mLs Intravenous Stopped 11/09/19 1358)  ketorolac (TORADOL) 15 MG/ML injection 15 mg (15 mg Intravenous Given 11/09/19 1427)  warfarin (COUMADIN) tablet 7.5 mg (7.5 mg Oral Given 11/10/19 1531)  lactated ringers bolus 1,000 mL (1,000 mLs Intravenous New Bag/Given (Non-Interop) 11/10/19 1103)    Mobility walks Low fall risk   Focused Assessments    R Recommendations: See Admitting Provider Note  Report given to:   Additional Notes:

## 2019-11-10 NOTE — Progress Notes (Addendum)
ANTICOAGULATION CONSULT NOTE -   Pharmacy Consult for warfarin/lovenox Indication: Mitral valve replacement  Allergies  Allergen Reactions  . Methotrexate Derivatives Other (See Comments)    Raised liver enzymes   . 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 kph    Patient Measurements: Height: 5' 4"  (162.6 cm) Weight: 69.4 kg (152 lb 14.4 oz) IBW/kg (Calculated) : 54.7   Vital Signs: BP: 104/68 (09/21 0830) Pulse Rate: 58 (09/21 0830)  Labs: Recent Labs    11/09/19 1015 11/09/19 1405 11/09/19 1841 11/10/19 0706  HGB 13.1  --   --  10.5*  HCT 37.5  --   --  32.4*  PLT 204  --   --  176  LABPROT 22.6*  --   --  24.8*  INR 2.1*  --   --  2.3*  CREATININE 1.17*  --   --  0.64  TROPONINIHS  --  10 9  --     Estimated Creatinine Clearance: 79.6 mL/min (by C-G formula based on SCr of 0.64 mg/dL).   Medical History: Past Medical History:  Diagnosis Date  . Abnormal Papanicolaou smear of cervix with positive human papilloma virus (HPV) test 08/17/2015  . Anticoagulation goal of INR 2.5 to 3.5   . Anxiety   . B12 deficiency   . BRCA1 negative   . BRCA2 negative   . Breast cancer, left breast ([place]) 2011   S/P mastectomy; chemo; "no radiation due to lupus"  . Bursitis of right knee    Septic bursitis  . Chronic lower back pain   . Drug-induced hepatitis    States per her rheumatologist, transaminases elevated but normalized after drug removed for   . Fibromyalgia   . GERD (gastroesophageal reflux disease)   . 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 History of abnormal cervical Pap smear 08/12/2015  . History of blood transfusion   . HSIL (high grade squamous intraepithelial lesion) on Pap smear of cervix 01/14/2017   colpo with Dr Elonda Husky   . Hypothyroidism   . Mitral valve disease, rheumatic    St. Jude prosthesis  . Peripheral neuropathy    . Pneumonia   . Raynaud phenomenon   . Rheumatoid arthritis ([place])   . SLE (systemic lupus erythematosus) ([facility])     Medications:  (Not in a hospital admission)   Assessment: Pharmacy consulted to dose warfarin in patient with mitral valve replacement- mechanical valve.  Patient's home dose listed as 7.5 mg every Tue-Thu-Sat and 5 mg ROW.  Patient recently had INR of 7.1 and held doses this past weekend 9/16 - 9/18  INR 2.3- warfarin dose not given by nursing on 9/20 for unknown reason  Goal of Therapy:  INR 2.5-3.5 Monitor platelets by anticoagulation protocol: Yes   Plan:  Warfarin 7.5 mg x 1 dose. Lovenox 70 mg subq every 12 hours. Monitor daily INR and s/s of bleeding.  Margot Ables, PharmD Clinical Pharmacist 11/10/2019 9:05 AM

## 2019-11-10 NOTE — ED Notes (Signed)
[facility] were notified to the Dr. Roderic Palau MD and Destiny RN.

## 2019-11-10 NOTE — Progress Notes (Signed)
PROGRESS NOTE    [name]  ULA:[id number] DOB: 03/26/1970 DOA: 11/09/2019 PCP: Sharilyn Sites, MD    Brief Narrative:  HPI: [name] is a 49 y.o. female with medical history significant of lupus on rituximab, hypothyroidism, gastroparesis, mechanical mitral valve on chronic anticoagulation, reports that for the past 3 days she has been having increasing weakness, myalgias.  She has had dyspnea on exertion.  She started to have productive cough and associated pleuritic chest pain that is worse with cough.  She has persistent nausea, vomiting and diarrhea.  P.o. intake has been poor for the past several days.  She is also been feverish.  She has not been vaccinated against COVID-19.  She was advised against vaccination by her rheumatologist since she is on chronic immunosuppression.  ED Course: She is noted to be febrile in the emergency room with a temperature of 102.  COVID-19 test found to be positive.  Chest x-ray indicated infiltrates consistent with pneumonia.  She is significantly hypokalemic and hypomagnesemic.  She will be referred for admission.   Assessment & Plan:   Active Problems:   History of mitral valve replacement with mechanical valve   Systemic lupus erythematosus ([place])   Long term current use of anticoagulant therapy   Gastroparesis   Primary hypothyroidism   Pneumonia due to COVID-19 virus   Hypokalemia   Pneumonia due to COVID-19 -Chest x-ray consistent with pneumonia -She was febrile and has elevated inflammatory markers -She is chronically immunosuppressed -Continue on remdesivir day 2 of 5 -Continue Decadron day 2 -Continue to follow inflammatory markers -Currently on room air, will monitor for developing hypoxia  Orthostatic hypotension -Becomes dizzy on standing and blood pressure drops into the 80s -Continue IV hydration  Vomiting, diarrhea -Secondary to COVID-19 -Treat supportively with  antiemetics  Hypokalemia/hypomagnesemia -Secondary to GI losses -Replaced -She reports that she does not tolerate IV magnesium, will initiate oral magnesium  Hypothyroidism -Continue Synthroid  Systemic lupus erythematosus -Chronically on rituximab once every 4 months -Last infusion was approximately 1 week ago -Continue to follow-up with rheumatology  Gastroparesis -Continue on home dose of metoclopramide  Mechanical mitral valve on chronic anticoagulation -INR subtherapeutic at 2.1 -Continue on Coumadin with Lovenox bridge -Goal INR is 2.5-3.5 -She had not taken Coumadin for several days prior to admission since INR had been supratherapeutic.    Currently on Coumadin with Lovenox bridge    DVT prophylaxis: Coumadin, Lovenox bridge  Code Status: Full code Family Communication: Discussed with patient Disposition Plan: Status is: Inpatient  Remains inpatient appropriate because:Inpatient level of care appropriate due to severity of illness   Dispo: The patient is from: Home              Anticipated d/c is to: Home              Anticipated d/c date is: 1 day              Patient currently is not medically stable to d/c.    Consultants:     Procedures:     Antimicrobials:       Subjective: Still has some nausea.  Becomes dizzy on standing.  Objective: Vitals:   11/10/19 1510 11/10/19 1515 11/10/19 1530 11/10/19 1900  BP: 99/68 (!) 86/61 (!) 94/56 93/61  Pulse: 74 71 73 (!) 59  Resp: 20 18 16 11   Temp:      TempSrc:      SpO2: 98% 97% 99% 98%  Weight:  Height:        Intake/Output Summary (Last 24 hours) at 11/10/2019 2006 Last data filed at 11/10/2019 1002 Gross per 24 hour  Intake 100 ml  Output --  Net 100 ml   Filed Weights   11/09/19 0833  Weight: 69.4 kg    Examination:  General exam: Appears calm and comfortable  Respiratory system: Clear to auscultation. Respiratory effort normal. Cardiovascular system: S1 & S2 heard,  RRR. No JVD, murmurs, rubs, gallops or clicks. No pedal edema. Gastrointestinal system: Abdomen is nondistended, soft and nontender. No organomegaly or masses felt. Normal bowel sounds heard. Central nervous system: Alert and oriented. No focal neurological deficits. Extremities: Symmetric 5 x 5 power. Skin: No rashes, lesions or ulcers Psychiatry: Judgement and insight appear normal. Mood & affect appropriate.     Data Reviewed: I have personally reviewed following labs and imaging studies  CBC: Recent Labs  Lab 11/09/19 1015 11/10/19 0706  WBC 6.3 2.6*  NEUTROABS  --  2.0  HGB 13.1 10.5*  HCT 37.5 32.4*  MCV 88.0 93.4  PLT 204 476   Basic Metabolic Panel: Recent Labs  Lab 11/09/19 1015 11/09/19 1405 11/10/19 0706  NA 136  --  139  K 2.5*  --  3.9  CL 99  --  108  CO2 24  --  22  GLUCOSE 87  --  121*  BUN 11  --  11  CREATININE 1.17*  --  0.64  CALCIUM 8.5*  --  8.1*  MG  --  1.3* 1.5*   GFR: Estimated Creatinine Clearance: 79.6 mL/min (by C-G formula based on SCr of 0.64 mg/dL). Liver Function Tests: Recent Labs  Lab 11/09/19 1015 11/10/19 0706  AST 30 19  ALT 24 18  ALKPHOS 61 48  BILITOT 1.0 0.7  PROT 7.3 5.9*  ALBUMIN 4.1 3.1*   No results for input(s): LIPASE, AMYLASE in the last 168 hours. No results for input(s): AMMONIA in the last 168 hours. Coagulation Profile: Recent Labs  Lab 11/05/19 1105 11/09/19 1015 11/10/19 0706  INR 7.1* 2.1* 2.3*   Cardiac Enzymes: No results for input(s): CKTOTAL, CKMB, CKMBINDEX, TROPONINI in the last 168 hours. BNP (last 3 results) No results for input(s): PROBNP in the last 8760 hours. HbA1C: No results for input(s): HGBA1C in the last 72 hours. CBG: No results for input(s): GLUCAP in the last 168 hours. Lipid Profile: Recent Labs    11/09/19 1015  TRIG 115   Thyroid Function Tests: No results for input(s): TSH, T4TOTAL, FREET4, T3FREE, THYROIDAB in the last 72 hours. Anemia Panel: Recent Labs     11/09/19 1015 11/10/19 0706  FERRITIN 474* 404*   Sepsis Labs: Recent Labs  Lab 11/09/19 1015 11/09/19 1405  PROCALCITON <0.10  --   LATICACIDVEN 1.3 0.9    Recent Results (from the past 240 hour(s))  SARS Coronavirus 2 by RT PCR (hospital order, performed in [facility] hospital lab) Nasopharyngeal Nasopharyngeal Swab     Status: Abnormal   Collection Time: 11/09/19  8:39 AM   Specimen: Nasopharyngeal Swab  Result Value Ref Range Status   SARS Coronavirus 2 POSITIVE (A) NEGATIVE Final    Comment: RESULT CALLED TO, READ BACK BY AND VERIFIED WITH: TALBOTT, TINA AT 5465 ON 11/09/2019 BY BAUGHAM,M. (NOTE) SARS-CoV-2 target nucleic acids are DETECTED  SARS-CoV-2 RNA is generally detectable in upper respiratory specimens  during the acute phase of infection.  Positive results are indicative  of the presence of the identified virus, but  do not rule out bacterial infection or co-infection with other pathogens not detected by the test.  Clinical correlation with patient history and  other diagnostic information is necessary to determine patient infection status.  The expected result is negative.  Fact Sheet for Patients:   StrictlyIdeas.no   Fact Sheet for Healthcare Providers:   BankingDealers.co.za    This test is not yet approved or cleared by the [place] FDA and  has been authorized for detection and/or diagnosis of SARS-CoV-2 by FDA under an Emergency Use Authorization (EUA).  This EUA will remain in effect ( meaning this test can be used) for the duration of  the COVID-19 declaration under Section 564(b)(1) of the Act, 21 U.S.C. section 360-bbb-3(b)(1), unless the authorization is terminated or revoked sooner.  Performed at [facility], [address] [place], [place] [address]   Blood culture (routine x 2)     Status: None (Preliminary result)   Collection Time: 11/09/19 10:15 AM   Specimen: BLOOD RIGHT FOREARM   Result Value Ref Range Status   Specimen Description BLOOD RIGHT FOREARM  Final   Special Requests   Final    BOTTLES DRAWN AEROBIC AND ANAEROBIC Blood Culture results may not be optimal due to an inadequate volume of blood received in culture bottles   Culture   Final    NO GROWTH < 24 HOURS Performed at [facility], [address], [place], [place] [address]    Report Status PENDING  Incomplete  Blood culture (routine x 2)     Status: None (Preliminary result)   Collection Time: 11/09/19 10:15 AM   Specimen: BLOOD RIGHT HAND  Result Value Ref Range Status   Specimen Description BLOOD RIGHT HAND  Final   Special Requests   Final    BOTTLES DRAWN AEROBIC AND ANAEROBIC Blood Culture results may not be optimal due to an inadequate volume of blood received in culture bottles   Culture   Final    NO GROWTH < 24 HOURS Performed at [facility], [address], [place], [place] [address]    Report Status PENDING  Incomplete         Radiology Studies: CT Head Wo Contrast  Result Date: 11/09/2019 CLINICAL DATA:  Pain following recent fall EXAM: CT HEAD WITHOUT CONTRAST TECHNIQUE: Contiguous axial images were obtained from the base of the skull through the vertex without intravenous contrast. COMPARISON:  None. FINDINGS: Brain: The ventricles and sulci are normal in size and configuration. There is no intracranial mass, hemorrhage, extra-axial fluid collection, or midline shift. The brain parenchyma appears normal. There is no demonstrable acute infarct. Vascular: No hyperdense vessel. No appreciable vascular calcification. Skull: The bony calvarium appears intact. Sinuses/Orbits: There is mucosal thickening in the maxillary antra bilaterally, more severe on the right than on the left. There is opacification of multiple ethmoid air cells. There is opacification in the frontal sinuses bilaterally. Orbits appear symmetric bilaterally. Other: The mastoid air cells are clear. IMPRESSION: Brain  parenchyma appears unremarkable.  No mass or hemorrhage. Multifocal paranasal sinus disease noted. Electronically Signed   By: Lowella Grip III M.D.   On: 11/09/2019 09:39   DG Chest Port 1 View  Result Date: 11/09/2019 CLINICAL DATA:  Cough and fever for the past week. EXAM: PORTABLE CHEST 1 VIEW COMPARISON:  Chest x-ray dated May 21, 2016. FINDINGS: The heart size and mediastinal contours are within normal limits. Normal pulmonary vascularity. Similar retained epicardial pacing wires overlying the right chest. New patchy opacities  at the peripheral right lung base. No focal consolidation, pleural effusion, or pneumothorax. No acute osseous abnormality. IMPRESSION: 1. Right lower lobe pneumonia with appearance concerning for atypical infection, including viral pneumonia. Electronically Signed   By: Titus Dubin M.D.   On: 11/09/2019 09:04        Scheduled Meds:  albuterol  2 puff Inhalation Q6H   buPROPion  300 mg Oral Daily   dexamethasone (DECADRON) injection  6 mg Intravenous Q24H   enoxaparin (LOVENOX) injection  70 mg Subcutaneous Q12H   famotidine  40 mg Oral QHS   levothyroxine  75 mcg Oral QAC breakfast   magnesium oxide  800 mg Oral BID   metoCLOPramide  5 mg Oral TID AC   pantoprazole  40 mg Oral Daily   potassium chloride SA  10 mEq Oral QPM   rOPINIRole  1 mg Oral QHS   Warfarin - Pharmacist Dosing Inpatient   Does not apply q1600   Continuous Infusions:  lactated ringers     remdesivir 100 mg in NS 100 mL Stopped (11/10/19 1002)     LOS: 0 days    Time spent: 34mins    Kathie Dike, MD [facility]   If 7PM-7AM, please contact night-coverage [email or web address]  11/10/2019, 8:06 PM

## 2019-11-11 ENCOUNTER — Other Ambulatory Visit: Payer: Self-pay | Admitting: Internal Medicine

## 2019-11-11 ENCOUNTER — Telehealth: Payer: Self-pay | Admitting: Cardiology

## 2019-11-11 DIAGNOSIS — D849 Immunodeficiency, unspecified: Secondary | ICD-10-CM

## 2019-11-11 DIAGNOSIS — E876 Hypokalemia: Secondary | ICD-10-CM

## 2019-11-11 LAB — COMPREHENSIVE METABOLIC PANEL
ALT: 18 U/L (ref 0–44)
AST: 21 U/L (ref 15–41)
Albumin: 3.2 g/dL — ABNORMAL LOW (ref 3.5–5.0)
Alkaline Phosphatase: 48 U/L (ref 38–126)
Anion gap: 8 (ref 5–15)
BUN: 9 mg/dL (ref 6–20)
CO2: 26 mmol/L (ref 22–32)
Calcium: 8.2 mg/dL — ABNORMAL LOW (ref 8.9–10.3)
Chloride: 102 mmol/L (ref 98–111)
Creatinine, Ser: 0.66 mg/dL (ref 0.44–1.00)
GFR calc Af Amer: >60 mL/min (ref >60)
GFR calc non Af Amer: >60 mL/min (ref >60)
Glucose, Bld: 108 mg/dL — ABNORMAL HIGH (ref 70–99)
Potassium: 3.9 mmol/L (ref 3.5–5.1)
Sodium: 136 mmol/L (ref 135–145)
Total Bilirubin: 0.6 mg/dL (ref 0.3–1.2)
Total Protein: 5.9 g/dL — ABNORMAL LOW (ref 6.5–8.1)

## 2019-11-11 LAB — CBC WITH DIFFERENTIAL/PLATELET
Abs Immature Granulocytes: 0.04 10*3/uL (ref 0.00–0.07)
Basophils Absolute: 0 10*3/uL (ref 0.0–0.1)
Basophils Relative: 0 %
Eosinophils Absolute: 0 10*3/uL (ref 0.0–0.5)
Eosinophils Relative: 0 %
HCT: 31.8 % — ABNORMAL LOW (ref 36.0–46.0)
Hemoglobin: 10.7 g/dL — ABNORMAL LOW (ref 12.0–15.0)
Immature Granulocytes: 1 %
Lymphocytes Relative: 7 %
Lymphs Abs: 0.5 10*3/uL — ABNORMAL LOW (ref 0.7–4.0)
MCH: 31.4 pg (ref 26.0–34.0)
MCHC: 33.6 g/dL (ref 30.0–36.0)
MCV: 93.3 fL (ref 80.0–100.0)
Monocytes Absolute: 0.2 10*3/uL (ref 0.1–1.0)
Monocytes Relative: 3 %
Neutro Abs: 5.9 10*3/uL (ref 1.7–7.7)
Neutrophils Relative %: 89 %
Platelets: 183 10*3/uL (ref 150–400)
RBC: 3.41 MIL/uL — ABNORMAL LOW (ref 3.87–5.11)
RDW: 14.2 % (ref 11.5–15.5)
WBC: 6.7 10*3/uL (ref 4.0–10.5)
nRBC: 0 % (ref 0.0–0.2)

## 2019-11-11 LAB — PROTIME-INR
INR: 3 — ABNORMAL HIGH (ref 0.8–1.2)
Prothrombin Time: 30.5 seconds — ABNORMAL HIGH (ref 11.4–15.2)

## 2019-11-11 LAB — D-DIMER, QUANTITATIVE: D-Dimer, Quant: <0.27 ug/mL-FEU (ref 0.00–0.50)

## 2019-11-11 LAB — FERRITIN: Ferritin: 86 ng/mL (ref 11–307)

## 2019-11-11 LAB — MAGNESIUM: Magnesium: 1.4 mg/dL — ABNORMAL LOW (ref 1.7–2.4)

## 2019-11-11 LAB — C-REACTIVE PROTEIN: CRP: 1.4 mg/dL — ABNORMAL HIGH (ref <1.0)

## 2019-11-11 MED ORDER — MAGNESIUM OXIDE 400 (241.3 MG) MG PO TABS
400.0000 mg | ORAL_TABLET | Freq: Two times a day (BID) | ORAL | 1 refills | Status: DC
Start: 2019-11-11 — End: 2019-11-26

## 2019-11-11 MED ORDER — HYDROCOD POLST-CPM POLST ER 10-8 MG/5ML PO SUER
5.0000 mL | Freq: Two times a day (BID) | ORAL | 0 refills | Status: DC | PRN
Start: 2019-11-11 — End: 2019-11-11

## 2019-11-11 MED ORDER — HYDROCOD POLST-CPM POLST ER 10-8 MG/5ML PO SUER
5.0000 mL | Freq: Two times a day (BID) | ORAL | 0 refills | Status: DC | PRN
Start: 2019-11-11 — End: 2019-11-26

## 2019-11-11 MED ORDER — WARFARIN SODIUM 1 MG PO TABS: 1.0000 mg | ORAL_TABLET | Freq: Once | ORAL | Status: DC

## 2019-11-11 MED ORDER — ACETAMINOPHEN 325 MG PO TABS: 650.0000 mg | ORAL_TABLET | Freq: Four times a day (QID) | ORAL | Status: AC | PRN

## 2019-11-11 MED ORDER — DEXAMETHASONE 6 MG PO TABS: 6.0000 mg | ORAL_TABLET | Freq: Every day | ORAL | 0 refills | Status: AC

## 2019-11-11 MED ORDER — HYDROCOD POLST-CPM POLST ER 10-8 MG/5ML PO SUER: 5.0000 mL | Freq: Two times a day (BID) | ORAL | Status: DC | PRN

## 2019-11-11 MED ORDER — FUROSEMIDE 40 MG PO TABS: 20.0000 mg | ORAL_TABLET | Freq: Every day | ORAL | Status: AC

## 2019-11-11 MED ORDER — PHENOL 1.4 % MT LIQD
1.0000 | OROMUCOSAL | Status: DC | PRN
  Administered 2019-11-11: 1 via OROMUCOSAL
  Filled 2019-11-11: qty 177

## 2019-11-11 MED ORDER — RITUXIMAB CHEMO INJECTION 100 MG/10ML: 100.0000 mg | INTRAVENOUS | Status: AC

## 2019-11-11 NOTE — Progress Notes (Signed)
Patient tussionex prescription reorder after error entry and having not availability at [facility]. Only one prescription provided.  Thanks  Barton Dubois MD [phone]

## 2019-11-11 NOTE — Plan of Care (Signed)

## 2019-11-11 NOTE — Progress Notes (Addendum)
ANTICOAGULATION CONSULT NOTE -   Pharmacy Consult for warfarin/lovenox Indication: Mitral valve replacement  Allergies  Allergen Reactions  . Methotrexate Derivatives Other (See Comments)    Raised liver enzymes   . 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 kph    Patient Measurements: Height: _0  (162.6 cm) Weight: 69.4 kg (152 lb 14.4 oz) IBW/kg (Calculated) : 54.7   Vital Signs: Temp: 97.5 F (36.4 C) (09/22 0218) Temp Source: Oral (09/22 0218) BP: 112/78 (09/22 0602) Pulse Rate: 62 (09/22 0602)  Labs: Recent Labs    11/09/19 1015 11/09/19 1015 11/09/19 1405 11/09/19 1841 11/10/19 0706 11/11/19 0617  HGB 13.1   < >  --   --  10.5* 10.7*  HCT 37.5  --   --   --  32.4* 31.8*  PLT 204  --   --   --  176 183  LABPROT 22.6*  --   --   --  24.8* 30.5*  INR 2.1*  --   --   --  2.3* 3.0*  CREATININE 1.17*  --   --   --  0.64 0.66  TROPONINIHS  --   --  10 9  --   --    < > = values in this interval not displayed.    Estimated Creatinine Clearance: 79.6 mL/min (by C-G formula based on SCr of 0.66 mg/dL).   Medical History: Past Medical History:  Diagnosis Date  . Abnormal Papanicolaou smear of cervix with positive human papilloma virus (HPV) test 08/17/2015  . Anticoagulation goal of INR 2.5 to 3.5   . Anxiety   . B12 deficiency   . BRCA1 negative   . BRCA2 negative   . Breast cancer, left breast ([place]) 2011   S/P mastectomy; chemo; "no radiation due to lupus"  . Bursitis of right knee    Septic bursitis  . Chronic lower back pain   . Drug-induced hepatitis    States per her rheumatologist, transaminases elevated but normalized after drug removed for   . Fibromyalgia   . GERD (gastroesophageal reflux disease)   . 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 History of abnormal cervical Pap smear 08/12/2015  . History of blood  transfusion   . HSIL (high grade squamous intraepithelial lesion) on Pap smear of cervix 01/14/2017   colpo with Dr Elonda Husky   . Hypothyroidism   . Mitral valve disease, rheumatic    St. Jude prosthesis  . Peripheral neuropathy   . Pneumonia   . Raynaud phenomenon   . Rheumatoid arthritis ([place])   . SLE (systemic lupus erythematosus) ([facility])     Medications:  Medications Prior to Admission  Medication Sig Dispense Refill Last Dose  . albuterol (VENTOLIN HFA) 108 (90 Base) MCG/ACT inhaler Inhale 1-2 puffs into the lungs every 6 (six) hours as needed for wheezing.    11/09/2019 at Unknown time  . bromfenac (XIBROM) 0.09 % ophthalmic solution Place 1 drop into the left eye.    UNK  . buPROPion (WELLBUTRIN XL) 300 MG 24 hr tablet Take 300 mg by mouth daily.   UNK  . DEXILANT 60 MG capsule TAKE ONE CAPSULE BY MOUTH DAILY. (Patient taking differently: Take 60 mg by mouth daily. ) 90 capsule 3 11/08/2019 at Unknown time  . famotidine (PEPCID) 40 MG tablet TAKE (1) TABLET BY MOUTH AT BEDTIME. (  Patient taking differently: Take 40 mg by mouth at bedtime. ) 90 tablet 1 11/08/2019 at Unknown time  . furosemide (LASIX) 40 MG tablet TAKE ONE TABLET BY MOUTH ONCE DAILY. (Patient taking differently: Take 40 mg by mouth daily. ) 90 tablet 1 11/08/2019 at Unknown time  . ibuprofen (ADVIL,MOTRIN) 800 MG tablet Take 800 mg by mouth 2 (two) times daily as needed for mild pain or moderate pain. Reported on 08/30/2015   Past Week at Unknown time  . levothyroxine (SYNTHROID) 75 MCG tablet Take 1 tablet (75 mcg total) by mouth daily before breakfast. 95 tablet 1 11/08/2019 at Unknown time  . metoCLOPramide (REGLAN) 5 MG tablet TAKE (1) TABLET BY MOUTH TWICE DAILY. (Patient taking differently: Take 5 mg by mouth in the morning and at bedtime. ) 180 tablet 2 11/08/2019 at Unknown time  . nepafenac (NEVANAC) 0.1 % ophthalmic suspension Place 1 drop into the left eye daily.    UNK  . ondansetron (ZOFRAN) 4 MG tablet Take 1 tablet (4  mg total) by mouth every 8 (eight) hours as needed for nausea or vomiting. 60 tablet 11 Past Week at Unknown time  . potassium chloride (KLOR-CON 10) 10 MEQ tablet Take 10 mEq by mouth every evening. Take one tablet daily as directed   11/08/2019 at Unknown time  . prednisoLONE acetate (PRED FORTE) 1 % ophthalmic suspension Place 1 drop into the left eye daily.    UNK  . promethazine (PHENERGAN) 12.5 MG tablet Take 1 tablet (12.5 mg total) by mouth every 6 (six) hours as needed for nausea or vomiting. 60 tablet 11 Past Week at Unknown time  . riTUXimab (RITUXAN) 100 MG/10ML injection Inject into the vein. Every 4 months   11/02/2019  . rOPINIRole (REQUIP) 1 MG tablet Take 1 mg by mouth at bedtime.   11/08/2019 at Unknown time  . warfarin (COUMADIN) 5 MG tablet TAKE 1 & 1/2 TABLETS BY MOUTH DAILY OR AS DIRECTED (Patient taking differently: Take 7.5 mg by mouth daily. ) 135 tablet 3 11/08/2019 at 2000  . zolpidem (AMBIEN) 10 MG tablet Take 10 mg by mouth at bedtime.    11/08/2019 at Unknown time  . cyanocobalamin (,VITAMIN B-12,) 1000 MCG/ML injection Inject 1 mL (1,000 mcg total) into the muscle every 30 (thirty) days. (Patient not taking: Reported on 11/09/2019) 10 mL prn Not Taking at Unknown time  . oxyCODONE-acetaminophen (PERCOCET/ROXICET) 5-325 MG tablet Take 1-2 tablets by mouth every 4 (four) hours as needed for severe pain. (Patient not taking: Reported on 11/09/2019) 30 tablet 0 Not Taking at Unknown time    Assessment: Pharmacy consulted to dose warfarin in patient with mitral valve replacement- mechanical valve.  Patient's home dose listed as 7.5 mg every Tue-Thu-Sat and 5 mg ROW.  Patient recently had INR of 7.1 and held doses this past weekend 9/16 - 9/18  INR 2.1> 2.3 > 3.0  9/16- 9/18: doses held 9/19- 5 mg 9/20- ordered dose charted as not given but unclear whether given or not 9/21- 7.5 mg  Goal of Therapy:  INR 2.5-3.5 Monitor platelets by anticoagulation protocol: Yes   Plan:   Warfarin 1 mg x 1 dose. Conservative dose due to large jump in INR and unsure if 9/20 dose was actually given or not. Lovenox 70 mg subq every 12 hours. Monitor daily INR and s/s of bleeding.  Margot Ables, PharmD Clinical Pharmacist 11/11/2019 8:30 AM

## 2019-11-11 NOTE — Telephone Encounter (Signed)
Called and talked to daughter [name].  Pt in [facility] for Covid pneumonia 9/20 - 9/22.  D/C on Decadron 6mg  x 10 days.  No Abx.  Will come to home for Remdesivir infusion on 9/23 & 9/24.  D/C INR today was 3.0  Told daughter to take warfarin 5mg  daily until INR check on 11/25/19 as pt cannot come in office for 14 days.  She verbalized understanding.

## 2019-11-11 NOTE — Discharge Summary (Signed)
Physician Discharge Summary  MARLYN TONDREAU IBB:[id number] DOB: [birth year] DOA: 11/09/2019  PCP: Sharilyn Sites, MD  Admit date: 11/09/2019 Discharge date: 11/11/2019  Time spent: 35 minutes  Recommendations for Outpatient Follow-up:  1. Repeat basic metabolic panel and magnesium level to assess electrolytes stability 2. Follow thyroid panel and further adjust Synthroid if needed.   Discharge Diagnoses:  Active Problems:   History of mitral valve replacement with mechanical valve   Systemic lupus erythematosus (HCC)   Chronic anticoagulation   Gastroparesis   Primary hypothyroidism   Pneumonia due to COVID-19 virus   Hypokalemia   Immunosuppression ([place])   Discharge Condition: Stable and improved.  Discharged home with instruction to follow-up with PCP in 2 weeks.  Also to follow-up with rheumatology service for further management of her systemic lupus condition.  CODE STATUS: Full code.  Diet recommendation: Heart healthy diet.  Filed Weights   11/09/19 0833  Weight: 69.4 kg    History of present illness:  IHW:TUUEKC T [name] a 49 y.o.femalewith medical history significant oflupus on rituximab, hypothyroidism, gastroparesis, mechanical mitral valve on chronic anticoagulation, reports that for the past 3 days she has been having increasing weakness, myalgias. She has had dyspnea on exertion. She started to have productive cough and associated pleuritic chest pain that is worse with cough. She has persistent nausea, vomiting and diarrhea. P.o. intake has been poor for the past several days. She is also been feverish. She has not been vaccinated against COVID-19. She was advised against vaccination by her rheumatologist since she is on chronic immunosuppression.  ED Course:She is noted to be febrile in the emergency room with a temperature of 102. COVID-19 test found to be positive. Chest x-ray indicated infiltrates consistent with pneumonia. She is significantly  hypokalemic and hypomagnesemic. She will be referred for admission.  Hospital Course:   Pneumonia due to COVID-19 -Chest x-ray consistent with pneumonia -She was febrile and had elevated inflammatory markers on presentation. -She is chronically immunosuppressed. -Continue on remdesivir day 3 of 5-at discharge; two more doses to be given at the outpatient infusion center. -Continue Decadron orally to complete a total of 10 days. -Continue to follow the 3W's for prevention and maintain adequate hydration. -Currently on room air, no oxygen supplementation required.  Orthostatic hypotension -Became dizzy on standing and blood pressure dropped into the 80s -resolved with IVF resuscitation -VS stable at discharge and not orthostatic abnormalities appreciated -lasix dose cut in half at discharge -Patient advised to maintain adequate hydration.  Vomiting, diarrhea, GERD -Secondary to COVID-19 -Treated supportively with antiemetics -in no acute distress -tolerating diet and not having active GI symptoms at discharge -continue PPI  Hypokalemia/hypomagnesemia -Secondary to GI losses -Replaced and stable at discharge -Resume home potassium supplementation and start oral magnesium on daily basis. -Repeat basic metabolic panel to follow electrolytes stability at follow-up visit.  Hypothyroidism -Continue Synthroid -follow up thyroid panel as an outpatient.  Systemic lupus erythematosus -Chronically on rituximab once every 4 months -Continue to follow-up with rheumatology -Next infusion anticipated for approximately 2-1/2 weeks according to patient; will discuss with rheumatology service prior to infusion to determine will be okay or recovering completely from Covid infection.  Gastroparesis -Continue on home dose of metoclopramide -Advised to follow multiple small meals throughout the day.  Mechanical mitral valve on chronic anticoagulation -INR subtherapeutic at 2.1 on  admission; patient was breech on Lovenox while adjusting Coumadin  -follow INR closely after discharge. -No signs of overt bleeding.  Procedures:  See below for x-ray  reports.   Consultations:  None   Discharge Exam: Vitals:   11/11/19 0218 11/11/19 0602  BP: 107/64 112/78  Pulse: 71 62  Resp: 18 17  Temp: (!) 97.5 F (36.4 C)   SpO2: 98% 99%    General: Afebrile, no requiring oxygen supplementation, no chest pain, no nausea, no vomiting.  No further orthostatic abnormalities appreciated.  Feeling ready to go home and complete outpatient remdesivir infusion. Cardiovascular: S1 and S2, no rubs, no gallops, no JVD on exam. Respiratory: No using accessory muscles, good oxygen saturation on room air, positive scattered rhonchi. -Abdomen: Soft, nontender, distended, positive bowel sounds Extremities: No cyanosis, no clubbing.  Discharge Instructions   Discharge Instructions    Diet - low sodium heart healthy   Complete by: As directed    Discharge instructions   Complete by: As directed    Take medications as prescribed Maintain adequate hydration Follow-up as instructed for outpatient remdesivir infusion over the next 2 days -Arrange follow-up with PCP in 2 weeks -Follow-up with rheumatology service (at least over the phone prior to next rituximab infusion). 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 kph      Medication List    STOP taking these medications   oxyCODONE-acetaminophen 5-325 MG tablet Commonly known as: PERCOCET/ROXICET     TAKE these medications   acetaminophen 325 MG tablet Commonly known as:  TYLENOL Take 2 tablets (650 mg total) by mouth every 6 (six) hours as needed for mild pain or headache (fever >/= 101).   albuterol 108 (90 Base) MCG/ACT inhaler Commonly known as: VENTOLIN HFA Inhale 1-2 puffs into the lungs every 6 (six) hours as needed for wheezing.   bromfenac 0.09 % ophthalmic solution Commonly known as: XIBROM Place 1 drop into the left eye.   buPROPion 300 MG 24 hr tablet Commonly known as: WELLBUTRIN XL Take 300 mg by mouth daily.   chlorpheniramine-HYDROcodone 10-8 MG/5ML Suer Commonly known as: TUSSIONEX Take 5 mLs by mouth every 12 (twelve) hours as needed for cough.   cyanocobalamin 1000 MCG/ML injection Commonly known as: (VITAMIN B-12) Inject 1 mL (1,000 mcg total) into the muscle every 30 (thirty) days.   dexamethasone 6 MG tablet Commonly known as: DECADRON Take 1 tablet (6 mg total) by mouth daily for 10 days.   Dexilant 60 MG capsule Generic drug: dexlansoprazole TAKE ONE CAPSULE BY MOUTH DAILY. What changed: how much to take   famotidine 40 MG tablet Commonly known as: PEPCID TAKE (1) TABLET BY MOUTH AT BEDTIME. What changed: See the new instructions.   furosemide 40 MG tablet Commonly known as: LASIX Take 0.5 tablets (20 mg total) by mouth daily. What changed: how much to take   ibuprofen 800 MG tablet Commonly known as: ADVIL Take 800 mg by mouth 2 (two) times daily as needed for mild pain or moderate pain. Reported on 08/30/2015   Klor-Con 10 10 MEQ tablet Generic drug: potassium chloride Take 10 mEq by mouth every evening. Take one tablet daily as directed   levothyroxine 75 MCG tablet Commonly known  as: SYNTHROID Take 1 tablet (75 mcg total) by mouth daily before breakfast.   magnesium oxide 400 (241.3 Mg) MG tablet Commonly known as: MAG-OX Take 1 tablet (400 mg total) by mouth 2 (two) times daily.   metoCLOPramide 5 MG tablet Commonly known as: REGLAN TAKE (1) TABLET BY MOUTH TWICE DAILY. What changed: See the new  instructions.   nepafenac 0.1 % ophthalmic suspension Commonly known as: [place] 1 drop into the left eye daily.   ondansetron 4 MG tablet Commonly known as: ZOFRAN Take 1 tablet (4 mg total) by mouth every 8 (eight) hours as needed for nausea or vomiting.   prednisoLONE acetate 1 % ophthalmic suspension Commonly known as: PRED FORTE Place 1 drop into the left eye daily.   promethazine 12.5 MG tablet Commonly known as: PHENERGAN Take 1 tablet (12.5 mg total) by mouth every 6 (six) hours as needed for nausea or vomiting.   riTUXimab 100 MG/10ML injection Commonly known as: RITUXAN Inject 10 mLs (100 mg total) into the vein See admin instructions. Every 4 months; please hold next injection until discussion with rheumatology service What changed:   how much to take  when to take this  additional instructions   rOPINIRole 1 MG tablet Commonly known as: REQUIP Take 1 mg by mouth at bedtime.   warfarin 5 MG tablet Commonly known as: COUMADIN Take as directed. If you are unsure how to take this medication, talk to your nurse or doctor. Original instructions: TAKE 1 & 1/2 TABLETS BY MOUTH DAILY OR AS DIRECTED What changed:   how much to take  how to take this  when to take this  additional instructions   zolpidem 10 MG tablet Commonly known as: AMBIEN Take 10 mg by mouth at bedtime.      Allergies  Allergen Reactions  . Methotrexate Derivatives Other (See Comments)    Raised liver enzymes   . 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 kph    Follow-up Information    Sharilyn Sites, MD. Schedule an appointment as soon as possible for a visit in 2 week(s).   Specialty: Family Medicine Contact information: [address] [place] [address] [phone]        Satira Sark, MD .   Specialty: Cardiology Contact information: [place] [address] [phone]               The results of significant diagnostics from this hospitalization (including imaging, microbiology, ancillary and laboratory) are listed below for reference.    Significant Diagnostic Studies: CT Head Wo Contrast  Result Date: 11/09/2019 CLINICAL DATA:  Pain following recent fall EXAM: CT HEAD WITHOUT CONTRAST TECHNIQUE: Contiguous axial images were obtained from the base of the skull through the vertex without intravenous contrast. COMPARISON:  None. FINDINGS: Brain: The ventricles and sulci are normal in size and configuration. There is no intracranial mass, hemorrhage, extra-axial fluid collection, or midline shift. The brain parenchyma appears normal. There is no demonstrable acute infarct. Vascular: No hyperdense vessel. No appreciable vascular calcification. Skull: The bony calvarium appears intact. Sinuses/Orbits: There is mucosal thickening in the maxillary antra bilaterally, more severe on the right than on the left. There is opacification of multiple ethmoid air cells. There is opacification in the frontal sinuses bilaterally. Orbits appear symmetric bilaterally. Other: The mastoid air cells are clear. IMPRESSION: Brain  parenchyma appears unremarkable.  No mass or hemorrhage. Multifocal paranasal sinus disease noted. Electronically Signed   By: Lowella Grip III M.D.   On: 11/09/2019 09:39   DG Chest Port 1 View  Result Date: 11/09/2019 CLINICAL DATA:  Cough and fever for the past week. EXAM: PORTABLE CHEST 1 VIEW COMPARISON:  Chest x-ray dated May 21, 2016. FINDINGS: The heart size and mediastinal contours are within normal limits. Normal pulmonary vascularity. Similar retained epicardial pacing wires overlying the right chest. New patchy opacities at the peripheral right lung base. No focal consolidation, pleural effusion, or pneumothorax. No acute osseous abnormality. IMPRESSION: 1. Right lower lobe pneumonia with appearance  concerning for atypical infection, including viral pneumonia. Electronically Signed   By: Titus Dubin M.D.   On: 11/09/2019 09:04    Microbiology: Recent Results (from the past 240 hour(s))  SARS Coronavirus 2 by RT PCR (hospital order, performed in [facility] hospital lab) Nasopharyngeal Nasopharyngeal Swab     Status: Abnormal   Collection Time: 11/09/19  8:39 AM   Specimen: Nasopharyngeal Swab  Result Value Ref Range Status   SARS Coronavirus 2 POSITIVE (A) NEGATIVE Final    Comment: RESULT CALLED TO, READ BACK BY AND VERIFIED WITH: TALBOTT, TINA AT 0931 ON 11/09/2019 BY BAUGHAM,M. (NOTE) SARS-CoV-2 target nucleic acids are DETECTED  SARS-CoV-2 RNA is generally detectable in upper respiratory specimens  during the acute phase of infection.  Positive results are indicative  of the presence of the identified virus, but do not rule out bacterial infection or co-infection with other pathogens not detected by the test.  Clinical correlation with patient history and  other diagnostic information is necessary to determine patient infection status.  The expected result is negative.  Fact Sheet for Patients:   StrictlyIdeas.no   Fact Sheet for Healthcare Providers:   BankingDealers.co.za    This test is not yet approved or cleared by the [place] FDA and  has been authorized for detection and/or diagnosis of SARS-CoV-2 by FDA under an Emergency Use Authorization (EUA).  This EUA will remain in effect ( meaning this test can be used) for the duration of  the COVID-19 declaration under Section 564(b)(1) of the Act, 21 U.S.C. section 360-bbb-3(b)(1), unless the authorization is terminated or revoked sooner.  Performed at [facility], [address], [place], [place] [address]   Blood culture (routine x 2)     Status: None (Preliminary result)   Collection Time: 11/09/19 10:15 AM   Specimen: BLOOD RIGHT FOREARM  Result Value Ref  Range Status   Specimen Description BLOOD RIGHT FOREARM  Final   Special Requests   Final    BOTTLES DRAWN AEROBIC AND ANAEROBIC Blood Culture results may not be optimal due to an inadequate volume of blood received in culture bottles   Culture   Final    NO GROWTH 2 DAYS Performed at [facility], [address], [place], [place] [address]    Report Status PENDING  Incomplete  Blood culture (routine x 2)     Status: None (Preliminary result)   Collection Time: 11/09/19 10:15 AM   Specimen: BLOOD RIGHT HAND  Result Value Ref Range Status   Specimen Description BLOOD RIGHT HAND  Final   Special Requests   Final    BOTTLES DRAWN AEROBIC AND ANAEROBIC Blood Culture results may not be optimal due to an inadequate volume of blood received in culture bottles   Culture   Final    NO GROWTH 2 DAYS Performed  at [facility], [address], [place], [place] [address]    Report Status PENDING  Incomplete     Labs: Basic Metabolic Panel: Recent Labs  Lab 11/09/19 1015 11/09/19 1405 11/10/19 0706 11/11/19 0617  NA 136  --  139 136  K 2.5*  --  3.9 3.9  CL 99  --  108 102  CO2 24  --  22 26  GLUCOSE 87  --  121* 108*  BUN 11  --  11 9  CREATININE 1.17*  --  0.64 0.66  CALCIUM 8.5*  --  8.1* 8.2*  MG  --  1.3* 1.5* 1.4*   Liver Function Tests: Recent Labs  Lab 11/09/19 1015 11/10/19 0706 11/11/19 0617  AST 30 19 21   ALT 24 18 18   ALKPHOS 61 48 48  BILITOT 1.0 0.7 0.6  PROT 7.3 5.9* 5.9*  ALBUMIN 4.1 3.1* 3.2*   CBC: Recent Labs  Lab 11/09/19 1015 11/10/19 0706 11/11/19 0617  WBC 6.3 2.6* 6.7  NEUTROABS  --  2.0 5.9  HGB 13.1 10.5* 10.7*  HCT 37.5 32.4* 31.8*  MCV 88.0 93.4 93.3  PLT 204 176 183    Signed:  Barton Dubois MD.  [facility] 11/11/2019, 11:55 AM

## 2019-11-11 NOTE — Plan of Care (Signed)

## 2019-11-11 NOTE — Discharge Instructions (Signed)
Patient scheduled for outpatient Remdesivir infusions at 1pm on Thursday 9/22, and 1pm Friday 9/23 at [facility]. Please inform the patient to park at [place], as staff will be escorting the patient through the [place] entrance of the hospital. Appointments take approximately 45 minutes.    There is a wave flag banner located near the entrance on [address]. Turn into this entrance and immediately turn left and park in 1 of the 5 designated Covid Infusion Parking spots. There is a phone number on the sign, please call and let the staff know what spot you are in and we will come out and get you. For questions call [phone].  Thanks.

## 2019-11-11 NOTE — Telephone Encounter (Signed)
New message    Patient was suppose to have appt a couple days ago , but pt was hospitalized and daughter wants to give you an update on her

## 2019-11-11 NOTE — Progress Notes (Signed)
Patient scheduled for outpatient Remdesivir infusions at 1pm on Thursday 9/22, and 1pm Friday 9/23 at [facility]. Please inform the patient to park at [place], as staff will be escorting the patient through the [place] entrance of the hospital. Appointments take approximately 45 minutes.    There is a wave flag banner located near the entrance on [address]. Turn into this entrance and immediately turn left and park in 1 of the 5 designated Covid Infusion Parking spots. There is a phone number on the sign, please call and let the staff know what spot you are in and we will come out and get you. For questions call [phone].  Thanks.

## 2019-11-12 ENCOUNTER — Ambulatory Visit (HOSPITAL_COMMUNITY)
Admit: 2019-11-12 | Discharge: 2019-11-12 | Disposition: A | Discharge status: Home / Self Care | Payer: BC Managed Care – PPO | Attending: Pulmonary Disease | Admitting: Pulmonary Disease

## 2019-11-12 DIAGNOSIS — U071 COVID-19: Secondary | ICD-10-CM | POA: Diagnosis not present

## 2019-11-12 MED ORDER — SODIUM CHLORIDE 0.9 % IV SOLN
100.0000 mg | Freq: Once | INTRAVENOUS | Status: AC
  Administered 2019-11-12: 100 mg via INTRAVENOUS
  Filled 2019-11-12: qty 20

## 2019-11-12 MED ORDER — FAMOTIDINE IN NACL 20-0.9 MG/50ML-% IV SOLN: 20.0000 mg | Freq: Once | INTRAVENOUS | Status: DC | PRN

## 2019-11-12 MED ORDER — EPINEPHRINE 0.3 MG/0.3ML IJ SOAJ: 0.3000 mg | Freq: Once | INTRAMUSCULAR | Status: DC | PRN

## 2019-11-12 MED ORDER — METHYLPREDNISOLONE SODIUM SUCC 125 MG IJ SOLR: 125.0000 mg | Freq: Once | INTRAMUSCULAR | Status: DC | PRN

## 2019-11-12 MED ORDER — DIPHENHYDRAMINE HCL 50 MG/ML IJ SOLN: 50.0000 mg | Freq: Once | INTRAMUSCULAR | Status: DC | PRN

## 2019-11-12 MED ORDER — ALBUTEROL SULFATE HFA 108 (90 BASE) MCG/ACT IN AERS: 2.0000 | INHALATION_SPRAY | Freq: Once | RESPIRATORY_TRACT | Status: DC | PRN

## 2019-11-12 MED ORDER — SODIUM CHLORIDE 0.9 % IV SOLN: INTRAVENOUS | Status: DC | PRN

## 2019-11-12 NOTE — Progress Notes (Signed)
  Diagnosis: COVID-19  Physician:Dr Joya Gaskins   Procedure: Cov[facility] Infusion Clinic Med: remdesivir infusion - Provided patient with remdesivir fact sheet for patients, parents and caregivers prior to infusion.  Complications: No immediate complications noted.  Discharge: Discharged home   [name] 11/12/2019

## 2019-11-12 NOTE — Discharge Instructions (Signed)
10 Things You Can Do to Manage Your COVID-19 Symptoms at Home If you have possible or confirmed COVID-19: 1. Stay home from work and school. And stay away from other public places. If you must go out, avoid using any kind of public transportation, ridesharing, or taxis. 2. Monitor your symptoms carefully. If your symptoms get worse, call your healthcare provider immediately. 3. Get rest and stay hydrated. 4. If you have a medical appointment, call the healthcare provider ahead of time and tell them that you have or may have COVID-19. 5. For medical emergencies, call 911 and notify the dispatch personnel that you have or may have COVID-19. 6. Cover your cough and sneezes with a tissue or use the inside of your elbow. 7. Wash your hands often with soap and water for at least 20 seconds or clean your hands with an alcohol-based hand sanitizer that contains at least 60% alcohol. 8. As much as possible, stay in a specific room and away from other people in your home. Also, you should use a separate bathroom, if available. If you need to be around other people in or outside of the home, wear a mask. 9. Avoid sharing personal items with other people in your household, like dishes, towels, and bedding. 10. Clean all surfaces that are touched often, like counters, tabletops, and doorknobs. Use household cleaning sprays or wipes according to the label instructions. cdc.gov/coronavirus 08/20/2018 This information is not intended to replace advice given to you by your health care provider. Make sure you discuss any questions you have with your health care provider. Document Revised: 01/22/2019 Document Reviewed: 01/22/2019 Elsevier Patient Education  2020 Elsevier Inc.  

## 2019-11-13 ENCOUNTER — Ambulatory Visit (HOSPITAL_COMMUNITY)
Admission: RE | Admit: 2019-11-13 | Discharge: 2019-11-13 | Disposition: A | Discharge status: Home / Self Care | Payer: BC Managed Care – PPO | Source: Ambulatory Visit | Attending: Pulmonary Disease | Admitting: Pulmonary Disease

## 2019-11-13 DIAGNOSIS — J1282 Pneumonia due to coronavirus disease 2019: Secondary | ICD-10-CM | POA: Insufficient documentation

## 2019-11-13 DIAGNOSIS — U071 COVID-19: Secondary | ICD-10-CM | POA: Insufficient documentation

## 2019-11-13 MED ORDER — ALBUTEROL SULFATE HFA 108 (90 BASE) MCG/ACT IN AERS: 2.0000 | INHALATION_SPRAY | Freq: Once | RESPIRATORY_TRACT | Status: DC | PRN

## 2019-11-13 MED ORDER — FAMOTIDINE IN NACL 20-0.9 MG/50ML-% IV SOLN: 20.0000 mg | Freq: Once | INTRAVENOUS | Status: DC | PRN

## 2019-11-13 MED ORDER — DIPHENHYDRAMINE HCL 50 MG/ML IJ SOLN: 50.0000 mg | Freq: Once | INTRAMUSCULAR | Status: DC | PRN

## 2019-11-13 MED ORDER — SODIUM CHLORIDE 0.9 % IV SOLN: INTRAVENOUS | Status: DC | PRN

## 2019-11-13 MED ORDER — METHYLPREDNISOLONE SODIUM SUCC 125 MG IJ SOLR: 125.0000 mg | Freq: Once | INTRAMUSCULAR | Status: DC | PRN

## 2019-11-13 MED ORDER — SODIUM CHLORIDE 0.9 % IV SOLN
100.0000 mg | Freq: Once | INTRAVENOUS | Status: AC
  Administered 2019-11-13: 100 mg via INTRAVENOUS
  Filled 2019-11-13: qty 20

## 2019-11-13 MED ORDER — EPINEPHRINE 0.3 MG/0.3ML IJ SOAJ: 0.3000 mg | Freq: Once | INTRAMUSCULAR | Status: DC | PRN

## 2019-11-13 NOTE — Discharge Instructions (Signed)
What types of side effects do monoclonal antibody drugs cause?  Common side effects  In general, the more common side effects caused by monoclonal antibody drugs include: . Allergic reactions, such as hives or itching . Flu-like signs and symptoms, including chills, fatigue, fever, and muscle aches and pains . Nausea, vomiting . Diarrhea . Skin rashes . Low blood pressure  .[place]  The CDC is recommending patients who receive monoclonal antibody treatments wait at least 90 days before being vaccinated.  Currently, there are no data on the safety and efficacy of mRNA COVID-19 vaccines in persons who received monoclonal antibodies or convalescent plasma as part of COVID-19 treatment. Based on the estimated half-life of such therapies as well as evidence suggesting that reinfection is uncommon in the 90 days after initial infection, vaccination should be deferred for at least 90 days, as a precautionary measure until additional information becomes available, to avoid interference of the antibody treatment with vaccine-induced immune responses.

## 2019-11-13 NOTE — Progress Notes (Signed)
°  Diagnosis: COVID-19  Physician: Dr Joya Gaskins   Procedure: Covid Infusion Clinic Med: remdesivir infusion - Provided patient with remdesivir fact sheet for patients, parents and caregivers prior to infusion.  Complications: No immediate complications noted.  Discharge: Discharged home   Carin Hock [facility] 11/13/2019

## 2019-11-14 LAB — CULTURE, BLOOD (ROUTINE X 2)
Culture: NO GROWTH
Culture: NO GROWTH

## 2019-11-24 ENCOUNTER — Inpatient Hospital Stay (HOSPITAL_COMMUNITY)
Admission: EM | Admit: 2019-11-24 | Discharge: 2019-11-26 | DRG: 177 | Disposition: A | Discharge status: Home / Self Care | Payer: BC Managed Care – PPO | Attending: Family Medicine | Admitting: Family Medicine

## 2019-11-24 ENCOUNTER — Emergency Department (HOSPITAL_COMMUNITY): Payer: BC Managed Care – PPO

## 2019-11-24 ENCOUNTER — Encounter (HOSPITAL_COMMUNITY): Payer: Self-pay | Admitting: Emergency Medicine

## 2019-11-24 ENCOUNTER — Other Ambulatory Visit: Payer: Self-pay

## 2019-11-24 DIAGNOSIS — Z9012 Acquired absence of left breast and nipple: Secondary | ICD-10-CM | POA: Diagnosis not present

## 2019-11-24 DIAGNOSIS — K219 Gastro-esophageal reflux disease without esophagitis: Secondary | ICD-10-CM | POA: Diagnosis present

## 2019-11-24 DIAGNOSIS — E039 Hypothyroidism, unspecified: Secondary | ICD-10-CM | POA: Diagnosis not present

## 2019-11-24 DIAGNOSIS — Z888 Allergy status to other drugs, medicaments and biological substances status: Secondary | ICD-10-CM | POA: Diagnosis not present

## 2019-11-24 DIAGNOSIS — R791 Abnormal coagulation profile: Secondary | ICD-10-CM | POA: Diagnosis not present

## 2019-11-24 DIAGNOSIS — E871 Hypo-osmolality and hyponatremia: Secondary | ICD-10-CM | POA: Diagnosis present

## 2019-11-24 DIAGNOSIS — Z952 Presence of prosthetic heart valve: Secondary | ICD-10-CM

## 2019-11-24 DIAGNOSIS — U071 COVID-19: Secondary | ICD-10-CM | POA: Diagnosis not present

## 2019-11-24 DIAGNOSIS — Z7901 Long term (current) use of anticoagulants: Secondary | ICD-10-CM | POA: Diagnosis not present

## 2019-11-24 DIAGNOSIS — Z9221 Personal history of antineoplastic chemotherapy: Secondary | ICD-10-CM | POA: Diagnosis not present

## 2019-11-24 DIAGNOSIS — Z79890 Hormone replacement therapy (postmenopausal): Secondary | ICD-10-CM | POA: Diagnosis not present

## 2019-11-24 DIAGNOSIS — G629 Polyneuropathy, unspecified: Secondary | ICD-10-CM | POA: Diagnosis present

## 2019-11-24 DIAGNOSIS — F419 Anxiety disorder, unspecified: Secondary | ICD-10-CM | POA: Diagnosis present

## 2019-11-24 DIAGNOSIS — Z947 Corneal transplant status: Secondary | ICD-10-CM

## 2019-11-24 DIAGNOSIS — Z8249 Family history of ischemic heart disease and other diseases of the circulatory system: Secondary | ICD-10-CM

## 2019-11-24 DIAGNOSIS — Z83438 Family history of other disorder of lipoprotein metabolism and other lipidemia: Secondary | ICD-10-CM | POA: Diagnosis not present

## 2019-11-24 DIAGNOSIS — M069 Rheumatoid arthritis, unspecified: Secondary | ICD-10-CM | POA: Diagnosis present

## 2019-11-24 DIAGNOSIS — M329 Systemic lupus erythematosus, unspecified: Secondary | ICD-10-CM | POA: Diagnosis not present

## 2019-11-24 DIAGNOSIS — K3184 Gastroparesis: Secondary | ICD-10-CM | POA: Diagnosis present

## 2019-11-24 DIAGNOSIS — E538 Deficiency of other specified B group vitamins: Secondary | ICD-10-CM | POA: Diagnosis present

## 2019-11-24 DIAGNOSIS — D849 Immunodeficiency, unspecified: Secondary | ICD-10-CM | POA: Diagnosis present

## 2019-11-24 DIAGNOSIS — Z87891 Personal history of nicotine dependence: Secondary | ICD-10-CM

## 2019-11-24 DIAGNOSIS — Z853 Personal history of malignant neoplasm of breast: Secondary | ICD-10-CM | POA: Diagnosis not present

## 2019-11-24 DIAGNOSIS — I7300 Raynaud's syndrome without gangrene: Secondary | ICD-10-CM | POA: Diagnosis not present

## 2019-11-24 DIAGNOSIS — J1282 Pneumonia due to coronavirus disease 2019: Secondary | ICD-10-CM | POA: Diagnosis present

## 2019-11-24 DIAGNOSIS — M797 Fibromyalgia: Secondary | ICD-10-CM | POA: Diagnosis present

## 2019-11-24 DIAGNOSIS — Z79899 Other long term (current) drug therapy: Secondary | ICD-10-CM

## 2019-11-24 DIAGNOSIS — J9601 Acute respiratory failure with hypoxia: Secondary | ICD-10-CM

## 2019-11-24 DIAGNOSIS — Z9882 Breast implant status: Secondary | ICD-10-CM

## 2019-11-24 DIAGNOSIS — J984 Other disorders of lung: Secondary | ICD-10-CM | POA: Diagnosis not present

## 2019-11-24 LAB — CBC WITH DIFFERENTIAL/PLATELET
Abs Immature Granulocytes: 0.06 10*3/uL (ref 0.00–0.07)
Basophils Absolute: 0 10*3/uL (ref 0.0–0.1)
Basophils Relative: 0 %
Eosinophils Absolute: 0 10*3/uL (ref 0.0–0.5)
Eosinophils Relative: 0 %
HCT: 37.3 % (ref 36.0–46.0)
Hemoglobin: 12.5 g/dL (ref 12.0–15.0)
Immature Granulocytes: 1 %
Lymphocytes Relative: 5 %
Lymphs Abs: 0.3 10*3/uL — ABNORMAL LOW (ref 0.7–4.0)
MCH: 30.3 pg (ref 26.0–34.0)
MCHC: 33.5 g/dL (ref 30.0–36.0)
MCV: 90.3 fL (ref 80.0–100.0)
Monocytes Absolute: 0.2 10*3/uL (ref 0.1–1.0)
Monocytes Relative: 3 %
Neutro Abs: 5.7 10*3/uL (ref 1.7–7.7)
Neutrophils Relative %: 91 %
Platelets: 324 10*3/uL (ref 150–400)
RBC: 4.13 MIL/uL (ref 3.87–5.11)
RDW: 13.7 % (ref 11.5–15.5)
WBC: 6.3 10*3/uL (ref 4.0–10.5)
nRBC: 0 % (ref 0.0–0.2)

## 2019-11-24 LAB — LACTIC ACID, PLASMA
Lactic Acid, Venous: 2 mmol/L (ref 0.5–1.9)
Lactic Acid, Venous: 1.6 mmol/L (ref 0.5–1.9)

## 2019-11-24 LAB — BASIC METABOLIC PANEL
Anion gap: 12 (ref 5–15)
BUN: 18 mg/dL (ref 6–20)
CO2: 26 mmol/L (ref 22–32)
Calcium: 8.8 mg/dL — ABNORMAL LOW (ref 8.9–10.3)
Chloride: 96 mmol/L — ABNORMAL LOW (ref 98–111)
Creatinine, Ser: 0.91 mg/dL (ref 0.44–1.00)
GFR calc non Af Amer: >60 mL/min (ref >60)
Glucose, Bld: 95 mg/dL (ref 70–99)
Potassium: 3.9 mmol/L (ref 3.5–5.1)
Sodium: 134 mmol/L — ABNORMAL LOW (ref 135–145)

## 2019-11-24 LAB — PROTIME-INR
INR: 5.1 (ref 0.8–1.2)
Prothrombin Time: 46 seconds — ABNORMAL HIGH (ref 11.4–15.2)

## 2019-11-24 LAB — FERRITIN: Ferritin: 1589 ng/mL — ABNORMAL HIGH (ref 11–307)

## 2019-11-24 LAB — D-DIMER, QUANTITATIVE: D-Dimer, Quant: 0.63 ug/mL-FEU — ABNORMAL HIGH (ref 0.00–0.50)

## 2019-11-24 LAB — C-REACTIVE PROTEIN: CRP: 3.1 mg/dL — ABNORMAL HIGH (ref <1.0)

## 2019-11-24 LAB — LACTATE DEHYDROGENASE: LDH: 564 U/L — ABNORMAL HIGH (ref 98–192)

## 2019-11-24 MED ORDER — FUROSEMIDE 20 MG PO TABS
20.0000 mg | ORAL_TABLET | Freq: Every day | ORAL | Status: DC
  Administered 2019-11-25 – 2019-11-26 (×2): 20 mg via ORAL
  Filled 2019-11-24 (×2): qty 1

## 2019-11-24 MED ORDER — BENZONATATE 100 MG PO CAPS
100.0000 mg | ORAL_CAPSULE | Freq: Once | ORAL | Status: AC
  Administered 2019-11-24: 100 mg via ORAL
  Filled 2019-11-24: qty 1

## 2019-11-24 MED ORDER — ZOLPIDEM TARTRATE 5 MG PO TABS
5.0000 mg | ORAL_TABLET | Freq: Every day | ORAL | Status: DC
  Administered 2019-11-24 – 2019-11-25 (×2): 5 mg via ORAL
  Filled 2019-11-24 (×2): qty 1

## 2019-11-24 MED ORDER — PROMETHAZINE HCL 25 MG/ML IJ SOLN
25.0000 mg | Freq: Once | INTRAMUSCULAR | Status: AC
  Administered 2019-11-24: 25 mg via INTRAVENOUS
  Filled 2019-11-24: qty 1

## 2019-11-24 MED ORDER — HYDROCOD POLST-CPM POLST ER 10-8 MG/5ML PO SUER
5.0000 mL | Freq: Two times a day (BID) | ORAL | Status: AC
  Administered 2019-11-24 – 2019-11-25 (×3): 5 mL via ORAL
  Filled 2019-11-24 (×3): qty 5

## 2019-11-24 MED ORDER — SODIUM CHLORIDE 0.9 % IV SOLN
1.0000 g | INTRAVENOUS | Status: DC
  Administered 2019-11-25: 1 g via INTRAVENOUS
  Filled 2019-11-24: qty 10

## 2019-11-24 MED ORDER — ONDANSETRON HCL 4 MG/2ML IJ SOLN
4.0000 mg | Freq: Four times a day (QID) | INTRAMUSCULAR | Status: DC | PRN
  Administered 2019-11-24 – 2019-11-25 (×4): 4 mg via INTRAVENOUS
  Filled 2019-11-24 (×4): qty 2

## 2019-11-24 MED ORDER — PANTOPRAZOLE SODIUM 40 MG PO TBEC
40.0000 mg | DELAYED_RELEASE_TABLET | Freq: Every day | ORAL | Status: DC
  Administered 2019-11-25 – 2019-11-26 (×2): 40 mg via ORAL
  Filled 2019-11-24 (×2): qty 1

## 2019-11-24 MED ORDER — ACETAMINOPHEN 325 MG PO TABS
650.0000 mg | ORAL_TABLET | Freq: Once | ORAL | Status: AC
  Administered 2019-11-24: 650 mg via ORAL
  Filled 2019-11-24: qty 2

## 2019-11-24 MED ORDER — METOCLOPRAMIDE HCL 10 MG PO TABS
5.0000 mg | ORAL_TABLET | Freq: Two times a day (BID) | ORAL | Status: DC
  Administered 2019-11-24 – 2019-11-26 (×4): 5 mg via ORAL
  Filled 2019-11-24 (×4): qty 1

## 2019-11-24 MED ORDER — LEVOTHYROXINE SODIUM 75 MCG PO TABS
75.0000 ug | ORAL_TABLET | Freq: Every day | ORAL | Status: DC
  Administered 2019-11-25 – 2019-11-26 (×2): 75 ug via ORAL
  Filled 2019-11-24 (×2): qty 1

## 2019-11-24 MED ORDER — SODIUM CHLORIDE 0.9 % IV SOLN
1.0000 g | Freq: Once | INTRAVENOUS | Status: AC
  Administered 2019-11-24: 1 g via INTRAVENOUS
  Filled 2019-11-24: qty 10

## 2019-11-24 MED ORDER — ALBUTEROL SULFATE HFA 108 (90 BASE) MCG/ACT IN AERS
8.0000 | INHALATION_SPRAY | Freq: Once | RESPIRATORY_TRACT | Status: AC
  Administered 2019-11-24: 8 via RESPIRATORY_TRACT
  Filled 2019-11-24: qty 6.7

## 2019-11-24 MED ORDER — GUAIFENESIN-DM 100-10 MG/5ML PO SYRP: 10.0000 mL | ORAL_SOLUTION | ORAL | Status: DC | PRN

## 2019-11-24 MED ORDER — ROPINIROLE HCL 1 MG PO TABS
1.0000 mg | ORAL_TABLET | Freq: Every day | ORAL | Status: DC
  Administered 2019-11-24 – 2019-11-25 (×2): 1 mg via ORAL
  Filled 2019-11-24 (×2): qty 1

## 2019-11-24 MED ORDER — CYCLOSPORINE 0.05 % OP EMUL
1.0000 [drp] | Freq: Every day | OPHTHALMIC | Status: DC
  Administered 2019-11-25 – 2019-11-26 (×2): 1 [drp] via OPHTHALMIC
  Filled 2019-11-24 (×2): qty 1

## 2019-11-24 MED ORDER — ASCORBIC ACID 500 MG PO TABS
500.0000 mg | ORAL_TABLET | Freq: Every day | ORAL | Status: DC
  Administered 2019-11-25 – 2019-11-26 (×2): 500 mg via ORAL
  Filled 2019-11-24 (×2): qty 1

## 2019-11-24 MED ORDER — DEXAMETHASONE SODIUM PHOSPHATE 10 MG/ML IJ SOLN
6.0000 mg | INTRAMUSCULAR | Status: DC
  Filled 2019-11-24: qty 1

## 2019-11-24 MED ORDER — AEROCHAMBER Z-STAT PLUS/MEDIUM MISC
1.0000 | Freq: Once | Status: AC
  Administered 2019-11-24: 1

## 2019-11-24 MED ORDER — DEXAMETHASONE SODIUM PHOSPHATE 10 MG/ML IJ SOLN
10.0000 mg | Freq: Once | INTRAMUSCULAR | Status: AC
  Administered 2019-11-24: 10 mg via INTRAVENOUS
  Filled 2019-11-24: qty 1

## 2019-11-24 MED ORDER — ALBUTEROL SULFATE HFA 108 (90 BASE) MCG/ACT IN AERS
2.0000 | INHALATION_SPRAY | Freq: Four times a day (QID) | RESPIRATORY_TRACT | Status: DC
  Administered 2019-11-25 – 2019-11-26 (×6): 2 via RESPIRATORY_TRACT

## 2019-11-24 MED ORDER — PREDNISOLONE ACETATE 1 % OP SUSP
1.0000 [drp] | Freq: Every day | OPHTHALMIC | Status: DC
  Administered 2019-11-25 – 2019-11-26 (×2): 1 [drp] via OPHTHALMIC
  Filled 2019-11-24 (×2): qty 1

## 2019-11-24 MED ORDER — POLYETHYLENE GLYCOL 3350 17 G PO PACK: 17.0000 g | PACK | Freq: Every day | ORAL | Status: DC | PRN

## 2019-11-24 MED ORDER — AZITHROMYCIN 250 MG PO TABS
500.0000 mg | ORAL_TABLET | Freq: Once | ORAL | Status: AC
  Administered 2019-11-24: 500 mg via ORAL
  Filled 2019-11-24: qty 2

## 2019-11-24 MED ORDER — ZINC SULFATE 220 (50 ZN) MG PO CAPS
220.0000 mg | ORAL_CAPSULE | Freq: Every day | ORAL | Status: DC
  Administered 2019-11-25 – 2019-11-26 (×2): 220 mg via ORAL
  Filled 2019-11-24 (×2): qty 1

## 2019-11-24 MED ORDER — ONDANSETRON HCL 4 MG PO TABS: 4.0000 mg | ORAL_TABLET | Freq: Four times a day (QID) | ORAL | Status: DC | PRN

## 2019-11-24 MED ORDER — MORPHINE SULFATE (PF) 2 MG/ML IV SOLN
2.0000 mg | Freq: Once | INTRAVENOUS | Status: AC
  Administered 2019-11-24: 2 mg via INTRAVENOUS
  Filled 2019-11-24: qty 1

## 2019-11-24 MED ORDER — PROMETHAZINE HCL 12.5 MG PO TABS
25.0000 mg | ORAL_TABLET | Freq: Once | ORAL | Status: AC
  Administered 2019-11-24: 25 mg via ORAL
  Filled 2019-11-24: qty 2

## 2019-11-24 MED ORDER — ACETAMINOPHEN 325 MG PO TABS: 650.0000 mg | ORAL_TABLET | Freq: Four times a day (QID) | ORAL | Status: DC | PRN

## 2019-11-24 MED ORDER — FAMOTIDINE 20 MG PO TABS
40.0000 mg | ORAL_TABLET | Freq: Every day | ORAL | Status: DC
  Administered 2019-11-24 – 2019-11-25 (×2): 40 mg via ORAL
  Filled 2019-11-24 (×2): qty 2

## 2019-11-24 MED ORDER — SODIUM CHLORIDE 0.9 % IV SOLN
500.0000 mg | INTRAVENOUS | Status: DC
  Administered 2019-11-25: 500 mg via INTRAVENOUS
  Filled 2019-11-24: qty 500

## 2019-11-24 NOTE — ED Provider Notes (Addendum)
[name] Provider Note   CSN: [id number] Arrival date & time: 11/24/19  1502     History Chief Complaint  Patient presents with  . Shortness of Breath   [name] is a 49 y.o. female who was recently discharged (9/20) from hospital admission for COVID-19 infection during today with worsening shortness of breath.  She has been checking her oxygen saturations at home, reports with minimal movement today her oxygen saturations are dropping into the high 80s.  She states that since discharge she has had persistent cough, nausea however has not had difficulty with her oxygen saturation until today.  She has accompanied at the bedside by her daughter [name], who is a Marine scientist and has been monitoring her at home.  I have personally reviewed this patient's medical record.  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 She was recently hospitalized for COVID-19 infection, discharged on 11/11/2019; she was treated with Decadron and completed the course of remdesivir.  Present in the time primarily GI related.  She did not require any oxygen supplementation while in the hospital.  She has history of systemic lupus erythematosus on immunosuppression with rituximab, for this reason she was unable to receive the COVID-19 vaccine.  Additional history of hypothyroidism, gastroparesis, breast cancer.  She denies recent surgery, immobilization, hormone replacement therapy.   HPI     Past Medical History:  Diagnosis Date  . Abnormal Papanicolaou smear of cervix with positive human papilloma virus (HPV) test 08/17/2015  . Anticoagulation goal of INR 2.5 to 3.5   . Anxiety   . B12 deficiency   . BRCA1 negative   . BRCA2 negative   . Breast cancer, left breast ([place]) 2011   S/P mastectomy; chemo; "no radiation due to lupus"  . Bursitis of right knee    Septic bursitis  . Chronic lower back pain   . Drug-induced hepatitis    States per her  rheumatologist, transaminases elevated but normalized after drug removed for   . Fibromyalgia   . GERD (gastroesophageal reflux disease)   . 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 History of abnormal cervical Pap smear 08/12/2015  . History of blood transfusion   . HSIL (high grade squamous intraepithelial lesion) on Pap smear of cervix 01/14/2017   colpo with Dr Elonda Husky   . Hypothyroidism   . Mitral valve disease, rheumatic    [employer]. [employer] prosthesis  . Peripheral neuropathy   . Pneumonia   . Raynaud phenomenon   . Rheumatoid arthritis ([place])   . SLE (systemic lupus erythematosus) ([place])     Patient Active Problem List   Diagnosis Date Noted  . Acute respiratory failure with hypoxia ([place]) 11/24/2019  . Immunosuppression ([place])   . Pneumonia due to COVID-19 virus 11/09/2019  . Hypokalemia 11/09/2019  . HSIL (high grade squamous intraepithelial lesion) on Pap smear of cervix 01/14/2017  . Encounter for gynecological examination with Papanicolaou smear of cervix 01/08/2017  . At risk for hemorrhage associated with liver biopsy 11/28/2016  . Elevated LFTs 10/04/2016  . Appendicitis with peritoneal abscess 05/23/2016  . Sepsis, unspecified organism ([place]) 05/22/2016  . AKI (acute kidney injury) ([place]) 05/22/2016  . Abdominal pain 05/21/2016  . Appendicitis with abscess 05/21/2016  . Volume depletion 05/21/2016  . History of breast cancer 05/21/2016  . Vaginal discharge 11/14/2015  . Vaginal burning 11/14/2015  . BV (bacterial vaginosis) 11/14/2015  . Abnormal Papanicolaou smear of cervix with positive human papilloma virus (  HPV) test 08/17/2015  . History of abnormal cervical Pap smear 08/12/2015  . Primary hypothyroidism 12/07/2014  . Primary hypercoagulable state ([place]) 12/14/2013  . Encounter for monitoring cardiotoxic drug therapy 11/23/2013  . Gastroparesis 08/13/2013  . Screening for colorectal cancer 03/18/2013  . Dyspepsia 03/12/2013  . Dyspnea  11/14/2012  . Anticoagulation goal of INR 2.5 to 3.5   . Peripheral neuropathy   . B12 deficiency 08/21/2010  . 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 BRCA1 negative 08/16/2010  . BRCA2 negative 08/16/2010  . Chronic anticoagulation 05/17/2010  . Malignant neoplasm of upper outer quadrant of female breast ([place]) 02/24/2010  . History of mitral valve replacement with mechanical valve 11/10/2009  . Systemic lupus erythematosus ([place]) 11/10/2009    Past Surgical History:  Procedure Laterality Date  . APPENDECTOMY N/A 05/23/2016   Procedure: OPEN APPENDECTOMY WITH DRAINAGE OF ABSCESS;  Surgeon: Fanny Skates, MD;  Location: [facility];  Service: General;  Laterality: N/A;  . APPENDECTOMY    . BREAST BIOPSY Left 2012  . BREAST IMPLANT EXCHANGE Left 12/2015  . CARDIAC CATHETERIZATION  2008  . CARDIAC VALVE REPLACEMENT    . CERVICAL CONIZATION W/BX N/A 03/06/2017   Procedure: Laser Conization of Cervix;  Surgeon: Florian Buff, MD;  Location: [facility];  Service: Gynecology;  Laterality: N/A;  . CORNEAL TRANSPLANT Left   . ENDOMETRIAL ABLATION  2008   She no longer has menses  . ESOPHAGOGASTRODUODENOSCOPY N/A 03/25/2013   Dr. Raliegh Scarlet reflux esophagitis-likely source of patient's symptoms (patulous EG junction). Hiatal hernia otherwise normal  . LAPAROSCOPIC CHOLECYSTECTOMY    . LIVER BIOPSY  11/28/2016   liver core/notes 11/28/2016  . MASTECTOMY Left 2012  . MASTOPEXY  02/14/2011   Procedure: MASTOPEXY;  Surgeon: Macon Large;  Location: [facility];  Service: Plastics;  Laterality: Bilateral;  Right Breast Reduction   . MITRAL VALVE REPLACEMENT  2008  . TISSUE EXPANDER PLACEMENT  02/14/2011   Procedure: TISSUE EXPANDER;  Surgeon: Macon Large;  Location: [facility];  Service: Plastics;  Laterality: Bilateral;  Left Breast Remove Tissue Expander Placement of Implant Breast Reconstruction  . TISSUE EXPANDER PLACEMENT Left 11/16/2010   breasst  . TUBAL  LIGATION       OB History    Gravida  2   Para  2   Term  0   Preterm      AB      Living  2     SAB      TAB      Ectopic      Multiple      Live Births  2           Family History  Problem Relation Age of Onset  . Hypertension Mother   . Hyperlipidemia Mother   . Hypertension Father   . Colon cancer Neg Hx   . Colon polyps Neg Hx     Social History   Tobacco Use  . Smoking status: Former Smoker    Packs/day: 1.00    Years: 15.00    Pack years: 15.00    Types: Cigarettes    Quit date: 02/19/2006    Years since quitting: 13.7  . Smokeless tobacco: Never Used  Vaping Use  . Vaping Use: Never used  Substance Use Topics  . Alcohol use: Not Currently    Alcohol/week: 0.0 standard drinks    Comment: 11/28/2016 "couple drinks/yearZ"  . Drug use: No    Home Medications  Prior to Admission medications   Medication Sig Start Date End Date Taking? Authorizing Provider  acetaminophen (TYLENOL) 325 MG tablet Take 2 tablets (650 mg total) by mouth every 6 (six) hours as needed for mild pain or headache (fever >/= 101). 11/11/19  Yes Barton Dubois, MD  albuterol (VENTOLIN HFA) 108 (90 Base) MCG/ACT inhaler Inhale 1-2 puffs into the lungs every 6 (six) hours as needed for wheezing.  05/07/19  Yes [provider]  chlorpheniramine-HYDROcodone (TUSSIONEX PENNKINETIC ER) 10-8 MG/5ML SUER Take 5 mLs by mouth every 12 (twelve) hours as needed for cough. 11/11/19  Yes Barton Dubois, MD  cyanocobalamin (,VITAMIN B-12,) 1000 MCG/ML injection Inject 1 mL (1,000 mcg total) into the muscle every 30 (thirty) days. 12/19/10  Yes Everardo All, MD  cycloSPORINE (RESTASIS) 0.05 % ophthalmic emulsion Place 1 drop into the left eye daily.   Yes [provider]  DEXILANT 60 MG capsule TAKE ONE CAPSULE BY MOUTH DAILY. Patient taking differently: Take 60 mg by mouth daily.  09/25/19  Yes Mahala Menghini, PA-C  famotidine (PEPCID) 40 MG tablet TAKE (1) TABLET BY MOUTH  AT BEDTIME. Patient taking differently: Take 40 mg by mouth at bedtime.  05/21/19  Yes Erenest Rasher, PA-C  furosemide (LASIX) 40 MG tablet Take 0.5 tablets (20 mg total) by mouth daily. 11/11/19  Yes Barton Dubois, MD  ibuprofen (ADVIL,MOTRIN) 800 MG tablet Take 800 mg by mouth 2 (two) times daily as needed for mild pain or moderate pain. Reported on 08/30/2015   Yes [provider]  levothyroxine (SYNTHROID) 75 MCG tablet Take 1 tablet (75 mcg total) by mouth daily before breakfast. 07/15/19  Yes Nida, Marella Chimes, MD  magnesium oxide (MAG-OX) 400 (241.3 Mg) MG tablet Take 1 tablet (400 mg total) by mouth 2 (two) times daily. 11/11/19  Yes Barton Dubois, MD  metoCLOPramide (REGLAN) 5 MG tablet TAKE (1) TABLET BY MOUTH TWICE DAILY. Patient taking differently: Take 5 mg by mouth in the morning and at bedtime.  02/03/19  Yes Annitta Needs, NP  ondansetron (ZOFRAN) 4 MG tablet Take 1 tablet (4 mg total) by mouth every 8 (eight) hours as needed for nausea or vomiting. 10/09/17  Yes Annitta Needs, NP  potassium chloride (KLOR-CON 10) 10 MEQ tablet Take 10 mEq by mouth every evening. Take one tablet daily as directed 11/09/13  Yes [provider]  prednisoLONE acetate (PRED FORTE) 1 % ophthalmic suspension Place 1 drop into the left eye daily.  09/28/19  Yes [provider]  promethazine (PHENERGAN) 12.5 MG tablet Take 1 tablet (12.5 mg total) by mouth every 6 (six) hours as needed for nausea or vomiting. 10/09/17  Yes Annitta Needs, NP  riTUXimab (RITUXAN) 100 MG/10ML injection Inject 10 mLs (100 mg total) into the vein See admin instructions. Every 4 months; please hold next injection until discussion with rheumatology service 11/11/19  Yes Barton Dubois, MD  rOPINIRole (REQUIP) 1 MG tablet Take 1 mg by mouth at bedtime.   Yes [provider]  warfarin (COUMADIN) 5 MG tablet TAKE 1 & 1/2 TABLETS BY MOUTH DAILY OR AS DIRECTED Patient taking differently: Take 7.5 mg by  mouth daily.  01/12/19  Yes Satira Sark, MD  zolpidem (AMBIEN) 10 MG tablet Take 10 mg by mouth at bedtime.  12/19/10  Yes de Stanford Scotland, MD  bromfenac Claris Che) 0.09 % ophthalmic solution Place 1 drop into the left eye.  06/23/19   [provider]  buPROPion (  WELLBUTRIN XL) 300 MG 24 hr tablet Take 300 mg by mouth daily. Patient not taking: Reported on 11/24/2019 10/21/19   [provider]  nepafenac (NEVANAC) 0.1 % ophthalmic suspension Place 1 drop into the left eye daily.  06/19/19   [provider]    Allergies    Magnesium-containing compounds, Methotrexate derivatives, and Other  Review of Systems   Review of Systems  Constitutional: Positive for activity change, appetite change, chills, fatigue and fever. Negative for diaphoresis.  HENT: Negative.   Eyes: Negative.   Respiratory: Positive for cough, chest tightness and shortness of breath.   Cardiovascular: Negative for chest pain, palpitations and leg swelling.  Gastrointestinal: Positive for abdominal pain, nausea and vomiting. Negative for diarrhea.  Genitourinary: Negative for dysuria.  Musculoskeletal: Negative.   Skin: Negative.   Neurological: Positive for weakness and headaches. Negative for dizziness, syncope and light-headedness.  All other systems reviewed and are negative.   Physical Exam Updated Vital Signs BP 108/77 (BP Location: Right Wrist)   Pulse 71   Temp 98.1 F (36.7 C) (Oral)   Resp 18   Ht 5' 4"  (1.626 m)   Wt 67.6 kg   SpO2 96%   BMI 25.58 kg/m   Physical Exam Vitals and nursing note reviewed.  HENT:     Head: Normocephalic and atraumatic.     Mouth/Throat:     Mouth: Mucous membranes are moist.     Pharynx: No oropharyngeal exudate or posterior oropharyngeal erythema.  Eyes:     General:        Right eye: No discharge.        Left eye: No discharge.     Conjunctiva/sclera: Conjunctivae normal.     Pupils: Pupils are equal, round, and reactive to light.    Neck:     Trachea: No tracheal deviation.  Cardiovascular:     Rate and Rhythm: Normal rate and regular rhythm.     Pulses: Normal pulses.     Heart sounds: Normal heart sounds. No murmur heard.   Pulmonary:     Effort: Pulmonary effort is normal. No tachypnea, accessory muscle usage or respiratory distress.     Breath sounds: Decreased air movement present. Examination of the right-lower field reveals rales. Examination of the left-lower field reveals rales. Decreased breath sounds and rales present. No rhonchi.  Chest:     Comments: Left mastectomy Abdominal:     General: Bowel sounds are normal. There is no distension.     Palpations: Abdomen is soft.     Tenderness: There is abdominal tenderness in the epigastric area.  Musculoskeletal:        General: No deformity.     Cervical back: Neck supple.     Right lower leg: No edema.     Left lower leg: No edema.  Skin:    General: Skin is warm and dry.     Capillary Refill: Capillary refill takes less than 2 seconds.     Findings: Ecchymosis present.          Comments: Unable to utilize left arm for IV access due to history of mastectomy on the left side.  Neurological:     General: No focal deficit present.     Mental Status: She is alert. Mental status is at baseline.  Psychiatric:        Mood and Affect: Mood normal.     ED Results / Procedures / Treatments   Labs (all labs ordered are listed, but only  abnormal results are displayed) Labs Reviewed  BASIC METABOLIC PANEL - Abnormal; Notable for the following components:      Result Value   Sodium 134 (*)    Chloride 96 (*)    Calcium 8.8 (*)    All other components within normal limits  CBC WITH DIFFERENTIAL/PLATELET - Abnormal; Notable for the following components:   Lymphs Abs 0.3 (*)    All other components within normal limits  LACTIC ACID, PLASMA - Abnormal; Notable for the following components:   Lactic Acid, Venous 2.0 (*)    All other components within  normal limits  PROTIME-INR - Abnormal; Notable for the following components:   Prothrombin Time 46.0 (*)    INR 5.1 (*)    All other components within normal limits  D-DIMER, QUANTITATIVE (NOT AT [name]) - Abnormal; Notable for the following components:   D-Dimer, Quant 0.63 (*)    All other components within normal limits  FERRITIN - Abnormal; Notable for the following components:   Ferritin 1,589 (*)    All other components within normal limits  LACTATE DEHYDROGENASE - Abnormal; Notable for the following components:   LDH 564 (*)    All other components within normal limits  C-REACTIVE PROTEIN - Abnormal; Notable for the following components:   CRP 3.1 (*)    All other components within normal limits  LACTIC ACID, PLASMA  PROCALCITONIN  CBC WITH DIFFERENTIAL/PLATELET  COMPREHENSIVE METABOLIC PANEL  C-REACTIVE PROTEIN  D-DIMER, QUANTITATIVE (NOT AT [name])  FERRITIN  PROTIME-INR    EKG EKG Interpretation  Date/Time:  Tuesday November 24 2019 15:17:42 EDT Ventricular Rate:  84 PR Interval:    QRS Duration: 73 QT Interval:  363 QTC Calculation: 430 R Axis:   88 Text Interpretation: Sinus rhythm Borderline short PR interval Abnormal R-wave progression, early transition Abnrm T, consider ischemia, anterolateral lds since last tracing no significant change Confirmed by Noemi Chapel [phone]) on 11/24/2019 3:55:09 PM   Radiology DG Chest Portable 1 View  Result Date: 11/24/2019 CLINICAL DATA:  Shortness of breath, history of breast cancer, recent COVID admission EXAM: PORTABLE CHEST 1 VIEW COMPARISON:  11/09/2019 FINDINGS: Bilateral pulmonary opacities are present, greatest in the lower left lung. This has increased since the prior study. No pleural effusion or pneumothorax. Stable cardiomediastinal contours with normal heart size. IMPRESSION: Bilateral pulmonary opacities, left greater than right. This is increased since 11/09/2019. Electronically Signed   By: Macy Mis M.D.    On: 11/24/2019 17:18    Procedures Procedures (including critical care time)  Medications Ordered in ED Medications  dexamethasone (DECADRON) injection 6 mg (has no administration in time range)  albuterol (VENTOLIN HFA) 108 (90 Base) MCG/ACT inhaler 2 puff (2 puffs Inhalation Not Given 11/24/19 2038)  ascorbic acid (VITAMIN C) tablet 500 mg (500 mg Oral Not Given 11/24/19 2038)  zinc sulfate capsule 220 mg (220 mg Oral Not Given 11/24/19 2038)  ondansetron [facility]) tablet 4 mg ( Oral See Alternative 11/24/19 2054)    Or  ondansetron (ZOFRAN) injection 4 mg (4 mg Intravenous Given 11/24/19 2054)  polyethylene glycol (MIRALAX / GLYCOLAX) packet 17 g (has no administration in time range)  acetaminophen (TYLENOL) tablet 650 mg (has no administration in time range)  chlorpheniramine-HYDROcodone (TUSSIONEX) 10-8 MG/5ML suspension 5 mL (5 mLs Oral Given 11/24/19 2050)  cycloSPORINE (RESTASIS) 0.05 % ophthalmic emulsion 1 drop (has no administration in time range)  pantoprazole (PROTONIX) EC tablet 40 mg (has no administration in time range)  famotidine (PEPCID) tablet 40  mg (40 mg Oral Given 11/24/19 2240)  furosemide (LASIX) tablet 20 mg (has no administration in time range)  levothyroxine (SYNTHROID) tablet 75 mcg (has no administration in time range)  metoCLOPramide (REGLAN) tablet 5 mg (5 mg Oral Given 11/24/19 2240)  prednisoLONE acetate (PRED FORTE) 1 % ophthalmic suspension 1 drop (has no administration in time range)  rOPINIRole (REQUIP) tablet 1 mg (1 mg Oral Given 11/24/19 2240)  zolpidem (AMBIEN) tablet 5 mg (5 mg Oral Given 11/24/19 2241)  cefTRIAXone (ROCEPHIN) 1 g in sodium chloride 0.9 % 100 mL IVPB (has no administration in time range)  azithromycin (ZITHROMAX) 500 mg in sodium chloride 0.9 % 250 mL IVPB (has no administration in time range)  promethazine (PHENERGAN) injection 25 mg (25 mg Intravenous Given 11/24/19 1630)  acetaminophen (TYLENOL) tablet 650 mg (650 mg Oral Given 11/24/19  1645)  benzonatate (TESSALON) capsule 100 mg (100 mg Oral Given 11/24/19 1645)  cefTRIAXone (ROCEPHIN) 1 g in sodium chloride 0.9 % 100 mL IVPB (0 g Intravenous Stopped 11/24/19 1858)  azithromycin (ZITHROMAX) tablet 500 mg (500 mg Oral Given 11/24/19 1827)  albuterol (VENTOLIN HFA) 108 (90 Base) MCG/ACT inhaler 8 puff (8 puffs Inhalation Given 11/24/19 2000)  aerochamber Z-Stat Plus/medium 1 each (1 each Other Given 11/24/19 2001)  dexamethasone (DECADRON) injection 10 mg (10 mg Intravenous Given 11/24/19 2001)  promethazine (PHENERGAN) tablet 25 mg (25 mg Oral Given 11/24/19 2001)  morphine 2 MG/ML injection 2 mg (2 mg Intravenous Given 11/24/19 2050)    ED Course  I have reviewed the triage vital signs and the nursing notes.  Pertinent labs & imaging results that were available during my care of the patient were reviewed by me and considered in my medical decision making (see chart for details).    MDM Rules/Calculators/A&P                          The time of my initial interview with the patient she is sitting comfortably in her hospital bed, on the monitor her oxygen saturation is at 82% on room air.  I placed her on 3 L of supplemental oxygen by nasal cannula with improvement in her oxygen saturation to 93%.   CBC, BMP, lactic acid pending  Chest x-ray pending  Phenergan for nausea.  Nurse messaging stating patient is requesting medication for cough and headache.  Tylenol and Tessalon Perles ordered.   CBC unremarkable, BMP with borderline hyponatremia 134.  Lactic acid of 2.0  Chest x-ray with interval worsening of right-sided pulmonary opacities, new left-sided infiltrates since chest x-ray on 11/09/2019.   Will cover patient with antibiotic therapy for community acquired pneumonia with Rocephin and azithromycin.  8 puffs albuterol with spacer, Decadron 10 mg IV for shortness of breath.  Tylenol, Tessalon Perles administered for headache and cough.  Phenergan administered  again for nausea  Hospitalist consulted, Dr. Leafy Kindle, who agrees to admit the patient to her service for acute hypoxemic respiratory failure secondary to COVID-19.  We appreciate their willingness to consult and care for this patient.   AMYLA HEFFNER was evaluated in Emergency Department on 11/24/2019 for the symptoms described in the history of present illness. She was evaluated in the context of the global COVID-19 pandemic, which necessitated consideration that the patient might be at risk for infection with the SARS-CoV-2 virus that causes COVID-19. Institutional protocols and algorithms that pertain to the evaluation of patients at risk for COVID-19 are in a state of  rapid change based on information released by regulatory bodies including the CDC and federal and state organizations. These policies and algorithms were followed during the patient's care in the ED.   Final Clinical Impression(s) / ED Diagnoses Final diagnoses:  None    Rx / DC Orders ED Discharge Orders    None       Emeline Darling, PA-C 11/24/19 [place], Gypsy Balsam, PA-C 11/24/19 2328    Noemi Chapel, MD 11/24/19 2328

## 2019-11-24 NOTE — ED Notes (Signed)
CRITICAL VALUE ALERT  Critical Value:  INR 5.1  Date & Time Notied:  11/24/2019 2000  Provider Notified: Dr. Sabra Heck & PA Sponseller  Orders Received/Actions taken: see chart

## 2019-11-24 NOTE — H&P (Signed)
History and Physical    [name] QZE:[id number] DOB: 1970/08/28 DOA: 11/24/2019  PCP: Sharilyn Sites, MD   Patient coming from: Home  I have personally briefly reviewed patient's old medical records in [place]  Chief Complaint: Increasing difficulty breathing  HPI: [name] is a 49 y.o. female with medical history significant for SLE, rheumatoid arthritis, Raynaud's, mitral valve replacement on chronic anticoagulation. Patient was recently admitted 9/20 - 9/22 for Covid pneumonia, she also had GI symptoms of diarrhea, she was not hypoxic, she was treated with remdesivir and steroids.  Since going home, patient's difficulty breathing persisted and worsened especially with exertion, with persistent cough intermittently productive.  Patient's daughter is a Marine scientist.  O2 sats dropped to 80s at home today hence patient presented back to the ED.  Patient completed course of outpatient remdesivir x 2 days.  Patient is unable to get COVID Vaccine due to immunosuppression.  ED Course: temp 98.6.  Respiratory rate 20-29, blood pre 26.  Ssure 98-108, O2 sat 87% on room air, improved 99% on 2 L O2.  WBC 6.3.  Unremarkable BMP.  Lactic acid 2 > 1.6.  INR elevated 5.1.  Portable chest x-ray showing worsening bilateral pulmonary opacities.  IV ceftriaxone and azithromycin started.  Hospitalist to admit for acute respiratory failure.  Review of Systems: As per HPI all other systems reviewed and negative.  Past Medical History:  Diagnosis Date  . Abnormal Papanicolaou smear of cervix with positive human papilloma virus (HPV) test 08/17/2015  . Anticoagulation goal of INR 2.5 to 3.5   . Anxiety   . B12 deficiency   . BRCA1 negative   . BRCA2 negative   . Breast cancer, left breast ([place]) 2011   S/P mastectomy; chemo; "no radiation due to lupus"  . Bursitis of right knee    Septic bursitis  . Chronic lower back pain   . Drug-induced hepatitis    States per her rheumatologist,  transaminases elevated but normalized after drug removed for   . Fibromyalgia   . GERD (gastroesophageal reflux disease)   . 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 History of abnormal cervical Pap smear 08/12/2015  . History of blood transfusion   . HSIL (high grade squamous intraepithelial lesion) on Pap smear of cervix 01/14/2017   colpo with Dr Elonda Husky   . Hypothyroidism   . Mitral valve disease, rheumatic    St. Jude prosthesis  . Peripheral neuropathy   . Pneumonia   . Raynaud phenomenon   . Rheumatoid arthritis ([place])   . SLE (systemic lupus erythematosus) ([place])     Past Surgical History:  Procedure Laterality Date  . APPENDECTOMY N/A 05/23/2016   Procedure: OPEN APPENDECTOMY WITH DRAINAGE OF ABSCESS;  Surgeon: Fanny Skates, MD;  Location: [facility];  Service: General;  Laterality: N/A;  . APPENDECTOMY    . BREAST BIOPSY Left 2012  . BREAST IMPLANT EXCHANGE Left 12/2015  . CARDIAC CATHETERIZATION  2008  . CARDIAC VALVE REPLACEMENT    . CERVICAL CONIZATION W/BX N/A 03/06/2017   Procedure: Laser Conization of Cervix;  Surgeon: Florian Buff, MD;  Location: [facility];  Service: Gynecology;  Laterality: N/A;  . CORNEAL TRANSPLANT Left   . ENDOMETRIAL ABLATION  2008   She no longer has menses  . ESOPHAGOGASTRODUODENOSCOPY N/A 03/25/2013   Dr. Raliegh Scarlet reflux esophagitis-likely source of patient's symptoms (patulous EG junction). Hiatal hernia otherwise normal  . LAPAROSCOPIC CHOLECYSTECTOMY    . LIVER BIOPSY  11/28/2016   liver core/notes 11/28/2016  . MASTECTOMY Left 2012  . MASTOPEXY  02/14/2011   Procedure: MASTOPEXY;  Surgeon: Macon Large;  Location: [facility];  Service: Plastics;  Laterality: Bilateral;  Right Breast Reduction   . MITRAL VALVE REPLACEMENT  2008  . TISSUE EXPANDER PLACEMENT  02/14/2011   Procedure: TISSUE EXPANDER;  Surgeon: Macon Large;  Location: [facility];  Service: Plastics;  Laterality: Bilateral;  Left Breast Remove Tissue  Expander Placement of Implant Breast Reconstruction  . TISSUE EXPANDER PLACEMENT Left 11/16/2010   breasst  . TUBAL LIGATION       reports that she quit smoking about 13 years ago. Her smoking use included cigarettes. She has a 15.00 pack-year smoking history. She has never used smokeless tobacco. She reports previous alcohol use. She reports that she does not use drugs.  Allergies  Allergen Reactions  . Magnesium-Containing Compounds Other (See Comments)    Chest pain to IV mag  . Methotrexate Derivatives Other (See Comments)    Raised liver enzymes   . 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 kph    Family History  Problem Relation Age of Onset  . Hypertension Mother   . Hyperlipidemia Mother   . Hypertension Father   . Colon cancer Neg Hx   . Colon polyps Neg Hx     Prior to Admission medications   Medication Sig Start Date End Date Taking? Authorizing Provider  acetaminophen (TYLENOL) 325 MG tablet Take 2 tablets (650 mg total) by mouth every 6 (six) hours as needed for mild pain or headache (fever >/= 101). 11/11/19  Yes Barton Dubois, MD  albuterol (VENTOLIN HFA) 108 (90 Base) MCG/ACT inhaler Inhale 1-2 puffs into the lungs every 6 (six) hours as needed for wheezing.  05/07/19  Yes [provider]  chlorpheniramine-HYDROcodone (TUSSIONEX PENNKINETIC ER) 10-8 MG/5ML SUER Take 5 mLs by mouth every 12 (twelve) hours as needed for cough. 11/11/19  Yes Barton Dubois, MD  cyanocobalamin (,VITAMIN B-12,) 1000 MCG/ML injection Inject 1 mL (1,000 mcg total) into the muscle every 30 (thirty) days. 12/19/10  Yes Everardo All, MD  cycloSPORINE (RESTASIS) 0.05 % ophthalmic emulsion Place 1 drop into the left eye daily.   Yes [provider]  DEXILANT 60 MG capsule TAKE ONE CAPSULE BY MOUTH DAILY. Patient taking differently: Take 60 mg by mouth daily.  09/25/19  Yes Mahala Menghini, PA-C  famotidine (PEPCID) 40 MG tablet TAKE (1) TABLET BY MOUTH AT BEDTIME. Patient taking differently: Take 40 mg by mouth at bedtime.  05/21/19  Yes Erenest Rasher, PA-C  furosemide (LASIX) 40 MG tablet Take 0.5 tablets (20 mg total) by mouth daily. 11/11/19  Yes Barton Dubois, MD  ibuprofen (ADVIL,MOTRIN) 800 MG tablet Take 800 mg by mouth 2 (two) times daily as needed for mild pain or moderate pain. Reported on 08/30/2015   Yes [provider]  levothyroxine (SYNTHROID) 75 MCG tablet Take 1 tablet (75 mcg total) by mouth daily before breakfast. 07/15/19  Yes Nida, Marella Chimes, MD  magnesium oxide (MAG-OX) 400 (241.3 Mg) MG tablet Take 1 tablet (400 mg total) by mouth 2 (two) times daily. 11/11/19  Yes Barton Dubois, MD  metoCLOPramide (REGLAN) 5 MG tablet TAKE (1) TABLET BY MOUTH TWICE DAILY. Patient taking differently: Take 5 mg by mouth in the morning and at bedtime.  02/03/19  Yes Annitta Needs, NP  ondansetron (ZOFRAN) 4 MG tablet Take 1 tablet (4 mg total) by mouth every 8 (eight) hours as needed for nausea or vomiting. 10/09/17  Yes Annitta Needs, NP  potassium chloride (KLOR-CON 10) 10 MEQ tablet Take 10 mEq by mouth every evening. Take one tablet daily as directed 11/09/13  Yes [provider]  prednisoLONE acetate (PRED FORTE) 1 % ophthalmic suspension Place 1 drop into the left eye daily.  09/28/19  Yes [provider]  promethazine (PHENERGAN) 12.5 MG tablet Take 1 tablet (12.5 mg total) by mouth every 6 (six) hours as needed for nausea or vomiting. 10/09/17  Yes Annitta Needs, NP  riTUXimab (RITUXAN) 100 MG/10ML injection Inject 10 mLs (100 mg total) into the vein See admin instructions. Every 4 months; please hold next injection until discussion with rheumatology service 11/11/19  Yes Barton Dubois, MD  rOPINIRole (REQUIP) 1 MG tablet Take 1 mg by mouth at bedtime.   Yes [provider]  warfarin (COUMADIN) 5 MG tablet TAKE 1 & 1/2 TABLETS  BY MOUTH DAILY OR AS DIRECTED Patient taking differently: Take 7.5 mg by mouth daily.  01/12/19  Yes Satira Sark, MD  zolpidem (AMBIEN) 10 MG tablet Take 10 mg by mouth at bedtime.  12/19/10  Yes de Stanford Scotland, MD  bromfenac Claris Che) 0.09 % ophthalmic solution Place 1 drop into the left eye.  06/23/19   [provider]  buPROPion (WELLBUTRIN XL) 300 MG 24 hr tablet Take 300 mg by mouth daily. Patient not taking: Reported on 11/24/2019 10/21/19   [provider]  nepafenac (NEVANAC) 0.1 % ophthalmic suspension Place 1 drop into the left eye daily.  06/19/19   [provider]    Physical Exam: Vitals:   11/24/19 1511 11/24/19 1700 11/24/19 1730 11/24/19 1830  BP:  108/83 102/88 102/72  Pulse:  76 80 75  Resp:  (!) 29 (!) 27 (!) 21  Temp:      TempSrc:      SpO2:  92% 99% 99%  Weight: 69.4 kg     Height: 5' 4"  (1.626 m)       Constitutional: Coughing, otherwise calm, comfortable Vitals:   11/24/19 1511 11/24/19 1700 11/24/19 1730 11/24/19 1830  BP:  108/83 102/88 102/72  Pulse:  76 80 75  Resp:  (!) 29 (!) 27 (!) 21  Temp:      TempSrc:      SpO2:  92% 99% 99%  Weight: 69.4 kg     Height: 5' 4"  (1.626 m)      Eyes: PERRL, lids and conjunctivae normal ENMT: Mucous membranes are moist. Posterior pharynx clear of any exudate or lesions.Normal dentition.  Neck: normal, supple, no masses, no thyromegaly Respiratory: clear to auscultation bilaterally, no wheezing, no crackles. Normal respiratory effort. No accessory muscle use.  Cardiovascular: Regular rate and rhythm, No extremity edema. 2+ pedal pulses.  Abdomen: no tenderness, no masses palpated. No hepatosplenomegaly. Bowel sounds positive.  Musculoskeletal: no clubbing / cyanosis. No joint deformity upper and lower extremities. Good ROM, no contractures. Normal muscle tone.  Skin: no rashes, lesions, ulcers. No induration Neurologic: No apparent cranial nerve abnormality, moving extremities  spontaneously. Psychiatric: Normal judgment and insight. Alert and oriented x 3. Normal mood.   Labs on Admission: I have personally reviewed following labs and imaging studies  CBC: Recent Labs  Lab 11/24/19 1636  WBC 6.3  NEUTROABS 5.7  HGB 12.5  HCT 37.3  MCV 90.3  PLT 132   Basic Metabolic Panel: Recent Labs  Lab 11/24/19 1636  NA 134*  K 3.9  CL 96*  CO2 26  GLUCOSE 95  BUN 18  CREATININE 0.91  CALCIUM 8.8*   Coagulation Profile: Recent Labs  Lab 11/24/19 1636  INR 5.1*    Radiological Exams on Admission: DG Chest Portable 1 View  Result Date: 11/24/2019 CLINICAL DATA:  Shortness of breath, history of breast cancer, recent COVID admission EXAM: PORTABLE CHEST 1 VIEW COMPARISON:  11/09/2019 FINDINGS: Bilateral pulmonary opacities are present, greatest in the lower left lung. This has increased since the prior study. No pleural effusion or pneumothorax. Stable cardiomediastinal contours with normal heart size. IMPRESSION: Bilateral pulmonary opacities, left greater than right. This is increased since 11/09/2019. Electronically Signed   By: Macy Mis M.D.   On: 11/24/2019 17:18    EKG: Independently reviewed.  Sinus rhythm, rate 84, QTc 430.  T wave abnormalities in V2 V3 1 and aVL, but these have not significantly changed from prior.  Assessment/Plan Principal Problem:   Acute respiratory failure with hypoxia (HCC) Active Problems:   Pneumonia due to COVID-19 virus   History of mitral valve replacement with mechanical valve   Systemic lupus erythematosus (HCC)   Anticoagulation goal of INR 2.5 to 3.5   Immunosuppression ([place])   Acute respiratory failure due to progression of Covid pneumonia -despite completing treatment with steroids and remdesivir.today. O2 sats down to 87% on room air, currently on 2 L sats 99%.  Was not hypoxic during recent hospitalization.  Chest x-ray-shows worsening bilateral pulmonary opacities left greater than right.  Doubt  pulmonary embolism, patient is on warfarin, and her INR is supratherapeutic at 5.1.  D-dimer not significantly elevated at 0.63. -Will continue antibiotics IV ceftriaxone and azithromycin -Obtain inflammatory markers and trend -Check procalcitonin - 10 Milligram dexamethasone given, continue 6 mg daily  -COVID-19 admission protocol -Mucolytic's, inhalers as needed -Patient has already completed course of remdesivir on recent hospitalization -Resume home Lasix 20 mg daily in a.m.  Rheumatoid arthritis, SLE, immunosuppression-stable. - Hold rituximab -As she has been off her immunosuppressants for a while, it may be appropriate to ask her rheumatologist on discharge if she can get the Covid vaccine.  Mitral valve replacement with mechanical valve, supratherapeutic INR -INR- 5.1.  Her anticoagulation goal INR is 2.5-3.5.  Hemoglobin is stable at 12.5, no sign of bleeding. -Hold warfarin for now, will allow INR to trend down without intervention -Daily PT/INR  Gastroparesis -Resume home metoclopramide  Hypothyroidism -Resume home Synthroid  DVT prophylaxis: SCDs, supratherapeutic INR, please resume anticoagulation when appropriate Code Status: Full code Family Communication: None at bedside Disposition Plan: ~2 days, pending improvement in respiratory status. Consults called: None Admission status: Inpatient, telemetry I certify that at the point of admission it is my clinical judgment that the patient will require inpatient hospital care spanning beyond 2 midnights from the point of admission due to high intensity of service, high risk for further deterioration and high frequency of surveillance required.    Bethena Roys MD [facility]  11/24/2019, 9:00 PM

## 2019-11-24 NOTE — ED Provider Notes (Signed)
This patient is a very pleasant 49 year old female, history of autoimmune disease, presents after having Covid illness and admission to the hospital with discharge feeling better until yesterday when she started getting more short of breath.  Today she had increasing hypoxia, oxygenation requirement is now present, 87% on room air.  Blood pressure is in the mid 80s, usually is in the upper 90s.  She is afebrile, she has no edema of her legs.  On my exam she does have relatively clear lungs, occasional rales or rhonchi but speaks in full sentences, coughing frequently.  Please see note below for peripheral IV placement secondary to poor IV access.  She has had a mastectomy on the left.  Would consider pulmonary embolism in addition to long Covid or pulmonary Covid.  Her initial Covid symptoms are mostly gastrointestinal  X-ray reveals focal infiltrates, this correlates with the patient's hypoxia fevers and tachycardia.  Hospitalist consulted for admission and agreeable, the patient has acute hypoxic respiratory failure secondary to SARS-CoV-2  [name] was evaluated in Emergency Department on 11/24/2019 for the symptoms described in the history of present illness. She was evaluated in the context of the global COVID-19 pandemic, which necessitated consideration that the patient might be at risk for infection with the SARS-CoV-2 virus that causes COVID-19. Institutional protocols and algorithms that pertain to the evaluation of patients at risk for COVID-19 are in a state of rapid change based on information released by regulatory bodies including the CDC and federal and state organizations. These policies and algorithms were followed during the patient's care in the ED.  .Critical Care Performed by: Noemi Chapel, MD Authorized by: Noemi Chapel, MD   Critical care provider statement:    Critical care time (minutes):  35   Critical care time was exclusive of:  Separately billable procedures and  treating other patients and teaching time   Critical care was necessary to treat or prevent imminent or life-threatening deterioration of the following conditions:  Respiratory failure   Critical care was time spent personally by me on the following activities:  Blood draw for specimens, development of treatment plan with patient or surrogate, discussions with consultants, evaluation of patient's response to treatment, examination of patient, obtaining history from patient or surrogate, ordering and performing treatments and interventions, ordering and review of laboratory studies, ordering and review of radiographic studies, pulse oximetry, re-evaluation of patient's condition and review of old charts Comments:        Ultrasound ED Peripheral IV (Provider)  Date/Time: 11/24/2019 4:29 PM Performed by: Noemi Chapel, MD Authorized by: Noemi Chapel, MD   Procedure details:    Indications: multiple failed IV attempts and poor IV access     Skin Prep: chlorhexidine gluconate     Location: right upper arm.   Angiocath:  20 G   Bedside Ultrasound Guided: Yes     Images: not archived     Patient tolerated procedure without complications: Yes     Dressing applied: Yes   Comments:          Medical screening examination/treatment/procedure(s) were conducted as a shared visit with non-physician practitioner(s) and myself.  I personally evaluated the patient during the encounter.  Clinical Impression:   Final diagnoses:  Acute hypoxemic respiratory failure due to COVID-19 [facility])          Noemi Chapel, MD 11/24/19 2327

## 2019-11-24 NOTE — ED Triage Notes (Signed)
Pt had shortness of breath with little exertion. The pt's O2 saturations dropped to the 80's at home. Pt had a recent covid admission in late September. O2 sats 87 in triage on room air.

## 2019-11-25 DIAGNOSIS — J9601 Acute respiratory failure with hypoxia: Secondary | ICD-10-CM

## 2019-11-25 LAB — COMPREHENSIVE METABOLIC PANEL
ALT: 23 U/L (ref 0–44)
AST: 26 U/L (ref 15–41)
Albumin: 3 g/dL — ABNORMAL LOW (ref 3.5–5.0)
Alkaline Phosphatase: 66 U/L (ref 38–126)
Anion gap: 10 (ref 5–15)
BUN: 24 mg/dL — ABNORMAL HIGH (ref 6–20)
CO2: 26 mmol/L (ref 22–32)
Calcium: 8.4 mg/dL — ABNORMAL LOW (ref 8.9–10.3)
Chloride: 98 mmol/L (ref 98–111)
Creatinine, Ser: 0.7 mg/dL (ref 0.44–1.00)
GFR calc non Af Amer: >60 mL/min (ref >60)
Glucose, Bld: 128 mg/dL — ABNORMAL HIGH (ref 70–99)
Potassium: 3.8 mmol/L (ref 3.5–5.1)
Sodium: 134 mmol/L — ABNORMAL LOW (ref 135–145)
Total Bilirubin: 0.7 mg/dL (ref 0.3–1.2)
Total Protein: 6.7 g/dL (ref 6.5–8.1)

## 2019-11-25 LAB — CBC WITH DIFFERENTIAL/PLATELET
Abs Immature Granulocytes: 0.07 10*3/uL (ref 0.00–0.07)
Basophils Absolute: 0 10*3/uL (ref 0.0–0.1)
Basophils Relative: 0 %
Eosinophils Absolute: 0 10*3/uL (ref 0.0–0.5)
Eosinophils Relative: 0 %
HCT: 33.3 % — ABNORMAL LOW (ref 36.0–46.0)
Hemoglobin: 10.9 g/dL — ABNORMAL LOW (ref 12.0–15.0)
Immature Granulocytes: 2 %
Lymphocytes Relative: 8 %
Lymphs Abs: 0.4 10*3/uL — ABNORMAL LOW (ref 0.7–4.0)
MCH: 29.6 pg (ref 26.0–34.0)
MCHC: 32.7 g/dL (ref 30.0–36.0)
MCV: 90.5 fL (ref 80.0–100.0)
Monocytes Absolute: 0.2 10*3/uL (ref 0.1–1.0)
Monocytes Relative: 5 %
Neutro Abs: 3.7 10*3/uL (ref 1.7–7.7)
Neutrophils Relative %: 85 %
Platelets: 303 10*3/uL (ref 150–400)
RBC: 3.68 MIL/uL — ABNORMAL LOW (ref 3.87–5.11)
RDW: 13.4 % (ref 11.5–15.5)
WBC: 4.3 10*3/uL (ref 4.0–10.5)
nRBC: 0 % (ref 0.0–0.2)

## 2019-11-25 LAB — PROTIME-INR
INR: 6.7 (ref 0.8–1.2)
Prothrombin Time: 56.2 seconds — ABNORMAL HIGH (ref 11.4–15.2)

## 2019-11-25 LAB — D-DIMER, QUANTITATIVE: D-Dimer, Quant: 0.29 ug/mL-FEU (ref 0.00–0.50)

## 2019-11-25 LAB — FERRITIN: Ferritin: 385 ng/mL — ABNORMAL HIGH (ref 11–307)

## 2019-11-25 LAB — C-REACTIVE PROTEIN: CRP: 1.3 mg/dL — ABNORMAL HIGH (ref <1.0)

## 2019-11-25 LAB — PROCALCITONIN: Procalcitonin: <0.1 ng/mL

## 2019-11-25 MED ORDER — PROMETHAZINE HCL 25 MG/ML IJ SOLN
25.0000 mg | Freq: Four times a day (QID) | INTRAMUSCULAR | Status: DC | PRN
  Administered 2019-11-25 – 2019-11-26 (×4): 25 mg via INTRAVENOUS
  Filled 2019-11-25 (×4): qty 1

## 2019-11-25 MED ORDER — MORPHINE SULFATE (PF) 2 MG/ML IV SOLN
2.0000 mg | INTRAVENOUS | Status: DC | PRN
  Administered 2019-11-25 – 2019-11-26 (×6): 2 mg via INTRAVENOUS
  Filled 2019-11-25 (×6): qty 1

## 2019-11-25 MED ORDER — METHYLPREDNISOLONE SODIUM SUCC 40 MG IJ SOLR
40.0000 mg | Freq: Two times a day (BID) | INTRAMUSCULAR | Status: DC
  Administered 2019-11-25 – 2019-11-26 (×3): 40 mg via INTRAVENOUS
  Filled 2019-11-25 (×3): qty 1

## 2019-11-25 MED ORDER — PHYTONADIONE 5 MG PO TABS
2.5000 mg | ORAL_TABLET | Freq: Once | ORAL | Status: AC
  Administered 2019-11-25: 2.5 mg via ORAL
  Filled 2019-11-25: qty 1

## 2019-11-25 NOTE — Progress Notes (Signed)
ANTICOAGULATION CONSULT NOTE - Initial Up Consult   Pharmacy Consult for warfarin dosing  Indication: atrial fibrillation   Allergies  Allergen Reactions  . Magnesium-Containing Compounds Other (See Comments)    Chest pain to IV mag  . Methotrexate Derivatives Other (See Comments)    Raised liver enzymes   . 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 kph      Patient Measurements: Last Weight  Most recent update: 11/24/2019 10:34 PM   Weight  67.6 kg (149 lb 0.5 oz)           Body mass index is 25.58 kg/m. [name]               Temp: 97.7 F (36.5 C) (10/06 0602) Temp Source: Oral (10/06 0602) BP: 119/90 (10/06 0602) Pulse Rate: 67 (10/06 0602)  Labs: Recent Labs    11/24/19 1636 11/25/19 0640  HGB 12.5 10.9*  HCT 37.3 33.3*  PLT 324 303  LABPROT 46.0* 56.2*  INR 5.1* 6.7*  CREATININE 0.91 0.70    Estimated Creatinine Clearance: 78.7 mL/min (by C-G formula based on SCr of 0.7 mg/dL).     Medications:  Facility-Administered Medications Prior to Admission  Medication Dose Route Frequency Provider Last Rate Last Admin  . chlorpheniramine-HYDROcodone (TUSSIONEX) 10-8 MG/5ML suspension 5 mL  5 mL Oral Q12H PRN Barton Dubois, MD       Medications Prior to Admission  Medication Sig Dispense Refill Last Dose  . acetaminophen (TYLENOL) 325 MG tablet Take 2 tablets (650 mg total) by mouth every 6 (six) hours as needed for mild pain or headache (fever >/= 101).   11/24/2019 at Unknown time  . albuterol (VENTOLIN HFA) 108 (90 Base) MCG/ACT inhaler Inhale 1-2 puffs into the lungs every 6 (six) hours as needed for wheezing.    11/24/2019 at Unknown time  . chlorpheniramine-HYDROcodone (TUSSIONEX PENNKINETIC ER) 10-8 MG/5ML SUER Take 5 mLs by mouth every 12 (twelve) hours as needed for cough. 280 mL 0 11/24/2019 at Unknown time  . cyanocobalamin (,VITAMIN B-12,) 1000  MCG/ML injection Inject 1 mL (1,000 mcg total) into the muscle every 30 (thirty) days. 10 mL prn   . cycloSPORINE (RESTASIS) 0.05 % ophthalmic emulsion Place 1 drop into the left eye daily.   11/24/2019 at Unknown time  . DEXILANT 60 MG capsule TAKE ONE CAPSULE BY MOUTH DAILY. (Patient taking differently: Take 60 mg by mouth daily. ) 90 capsule 3 11/24/2019 at Unknown time  . famotidine (PEPCID) 40 MG tablet TAKE (1) TABLET BY MOUTH AT BEDTIME. (Patient taking differently: Take 40 mg by mouth at bedtime. ) 90 tablet 1 11/23/2019 at Unknown time  . furosemide (LASIX) 40 MG tablet Take 0.5 tablets (20 mg total) by mouth daily.   11/24/2019 at Unknown time  . ibuprofen (ADVIL,MOTRIN) 800 MG tablet Take 800 mg by mouth 2 (two) times daily as needed for mild pain or moderate pain. Reported on 08/30/2015   11/24/2019 at Unknown time  . levothyroxine (SYNTHROID) 75 MCG tablet Take 1 tablet (75 mcg total) by mouth daily before breakfast. 95 tablet 1 11/24/2019 at Unknown time  . magnesium oxide (MAG-OX) 400 (241.3 Mg) MG tablet Take 1 tablet (400 mg total) by mouth 2 (two) times daily. 30 tablet 1 Past Month at Unknown time  . metoCLOPramide (REGLAN) 5 MG tablet TAKE (1) TABLET BY MOUTH TWICE DAILY. (  Patient taking differently: Take 5 mg by mouth in the morning and at bedtime. ) 180 tablet 2 Past Month at Unknown time  . ondansetron (ZOFRAN) 4 MG tablet Take 1 tablet (4 mg total) by mouth every 8 (eight) hours as needed for nausea or vomiting. 60 tablet 11 Past Week at Unknown time  . potassium chloride (KLOR-CON 10) 10 MEQ tablet Take 10 mEq by mouth every evening. Take one tablet daily as directed   11/24/2019 at Unknown time  . prednisoLONE acetate (PRED FORTE) 1 % ophthalmic suspension Place 1 drop into the left eye daily.    11/24/2019 at Unknown time  . promethazine (PHENERGAN) 12.5 MG tablet Take 1 tablet (12.5 mg total) by mouth every 6 (six) hours as needed for nausea or vomiting. 60 tablet 11 11/24/2019 at  Unknown time  . riTUXimab (RITUXAN) 100 MG/10ML injection Inject 10 mLs (100 mg total) into the vein See admin instructions. Every 4 months; please hold next injection until discussion with rheumatology service 10 mL  Past Month at Unknown time  . rOPINIRole (REQUIP) 1 MG tablet Take 1 mg by mouth at bedtime.   11/23/2019 at Unknown time  . warfarin (COUMADIN) 5 MG tablet TAKE 1 & 1/2 TABLETS BY MOUTH DAILY OR AS DIRECTED (Patient taking differently: Take 7.5 mg by mouth daily. ) 135 tablet 3 11/24/2019 at Unknown time  . zolpidem (AMBIEN) 10 MG tablet Take 10 mg by mouth at bedtime.    11/23/2019 at Unknown time  . bromfenac (XIBROM) 0.09 % ophthalmic solution Place 1 drop into the left eye.      Marland Kitchen buPROPion (WELLBUTRIN XL) 300 MG 24 hr tablet Take 300 mg by mouth daily. (Patient not taking: Reported on 11/24/2019)   Not Taking at Unknown time  . nepafenac (NEVANAC) 0.1 % ophthalmic suspension Place 1 drop into the left eye daily.       Scheduled:  . albuterol  2 puff Inhalation Q6H  . vitamin C  500 mg Oral Daily  . chlorpheniramine-HYDROcodone  5 mL Oral Q12H  . cycloSPORINE  1 drop Left Eye Daily  . famotidine  40 mg Oral QHS  . furosemide  20 mg Oral Daily  . levothyroxine  75 mcg Oral Q0600  . methylPREDNISolone (SOLU-MEDROL) injection  40 mg Intravenous Q12H  . metoCLOPramide  5 mg Oral BID  . pantoprazole  40 mg Oral Daily  . prednisoLONE acetate  1 drop Left Eye Daily  . rOPINIRole  1 mg Oral QHS  . zinc sulfate  220 mg Oral Daily  . zolpidem  5 mg Oral QHS   Infusions:  . azithromycin    . cefTRIAXone (ROCEPHIN)  IV     PRN: acetaminophen, morphine injection, ondansetron **OR** ondansetron (ZOFRAN) IV, polyethylene glycol Anti-infectives (From admission, onward)   Start     Dose/Rate Route Frequency Ordered Stop   11/25/19 1800  cefTRIAXone (ROCEPHIN) 1 g in sodium chloride 0.9 % 100 mL IVPB        1 g 200 mL/hr over 30 Minutes Intravenous Every 24 hours 11/24/19 2214      11/25/19 1800  azithromycin (ZITHROMAX) 500 mg in sodium chloride 0.9 % 250 mL IVPB        500 mg 250 mL/hr over 60 Minutes Intravenous Every 24 hours 11/24/19 2214     11/24/19 1815  cefTRIAXone (ROCEPHIN) 1 g in sodium chloride 0.9 % 100 mL IVPB        1 g 200 mL/hr over  30 Minutes Intravenous  Once 11/24/19 1810 11/24/19 1858   11/24/19 1815  azithromycin (ZITHROMAX) tablet 500 mg        500 mg Oral  Once 11/24/19 1810 11/24/19 1827      Goal of Therapy:  INR 2.5-3.5 Monitor platelets by anticoagulation protocol: Yes    Prior to Admission Warfarin Dosing:  [name] takes 7.5mg  of warfarin daily      Admit INR was 5.1 then 6.7 following day. Lab Results  Component Value Date   INR 6.7 (HH) 11/25/2019   INR 5.1 (HH) 11/24/2019   INR 3.0 (H) 11/11/2019    Assessment: [name] a 49 y.o. female requires anticoagulation with warfarin for the indication of mitral mechanical valve . Warfarin will be initiated inpatient following pharmacy protocol per pharmacy consult. Patient most recent blood work is as follows: CBC Latest Ref Rng & Units 11/25/2019 11/24/2019 11/11/2019  WBC 4.0 - 10.5 K/uL 4.3 6.3 6.7  Hemoglobin 12.0 - 15.0 g/dL 10.9(L) 12.5 10.7(L)  Hematocrit 36 - 46 % 33.3(L) 37.3 31.8(L)  Platelets 150 - 400 K/uL 303 324 183     Plan: Hold warfarin tonight Monitor CBC daily with am labs   Monitor INR daily Monitor for signs and symptoms of bleeding   Donna Christen Darryon Bastin, PharmD, MBA, BCGP Clinical Pharmacist

## 2019-11-25 NOTE — Progress Notes (Signed)
CRITICAL VALUE ALERT  Critical Value:  INR 6.7  Date & Time Notied:  11/25/19 1026  Provider Notified: MD Courage Denton Brick  Orders Received/Actions taken: No new orders at this time

## 2019-11-25 NOTE — Progress Notes (Signed)
Pt arrived to unit via ED transport. Pt does not appear to be in distress and is on 2L02 vss. Pt instructed on how to use call bell and to wait for staff before getting out of bed. Bed is in lowest position and wheels locked will continue to monitor.

## 2019-11-25 NOTE — Progress Notes (Signed)
History and Physical    [name] HQI:[id number] DOB: 10/23/1970 DOA: 11/24/2019  PCP: [name] Sites, MD   Patient coming from: Home  I have personally briefly reviewed patient's old medical records in [place]  Chief Complaint: Increasing difficulty breathing  HPI: [name] is a 49 y.o. female with medical history significant for SLE, rheumatoid arthritis, Raynaud's, mitral valve replacement on chronic anticoagulation. Patient was recently admitted 9/20 - 9/22 for Covid pneumonia, she also had GI symptoms of diarrhea, she was not hypoxic, she was treated with remdesivir and steroids.  Since going home, patient's difficulty breathing persisted and worsened especially with exertion, with persistent cough intermittently productive.  Patient's daughter is a Marine scientist.  O2 sats dropped to 80s at home today hence patient presented back to the ED.  Patient completed course of outpatient remdesivir x 2 days.  Patient is unable to get COVID Vaccine due to immunosuppression.  ED Course: temp 98.6.  Respiratory rate 20-29, blood pre 26.  Ssure 98-108, O2 sat 87% on room air, improved 99% on 2 L O2.  WBC 6.3.  Unremarkable BMP.  Lactic acid 2 > 1.6.  INR elevated 5.1.  Portable chest x-ray showing worsening bilateral pulmonary opacities.  IV ceftriaxone and azithromycin started.  Hospitalist to admit for acute respiratory failure.  Review of Systems: As per HPI all other systems reviewed and negative.  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 Jude prosthesis   Peripheral neuropathy    Pneumonia    Raynaud phenomenon    Rheumatoid arthritis ([place])    SLE (systemic lupus erythematosus) ([place])     Past Surgical History:  Procedure Laterality Date   APPENDECTOMY N/A 05/23/2016   Procedure: OPEN APPENDECTOMY WITH DRAINAGE OF ABSCESS;  Surgeon: Fanny Skates, MD;  Location: [facility];  Service: General;  Laterality: N/A;   APPENDECTOMY     BREAST BIOPSY Left 2012   BREAST IMPLANT EXCHANGE Left 12/2015   CARDIAC CATHETERIZATION  2008   CARDIAC VALVE REPLACEMENT     CERVICAL CONIZATION W/BX N/A 03/06/2017   Procedure: Laser Conization of Cervix;  Surgeon: Florian Buff, MD;  Location: [facility];  Service: Gynecology;  Laterality: N/A;   CORNEAL TRANSPLANT Left    ENDOMETRIAL ABLATION  2008   She no longer has menses   ESOPHAGOGASTRODUODENOSCOPY N/A 03/25/2013   Dr. Raliegh Scarlet reflux esophagitis-likely source of patient's symptoms (patulous EG junction). Hiatal hernia otherwise normal   LAPAROSCOPIC CHOLECYSTECTOMY     LIVER BIOPSY  11/28/2016   liver core/notes 11/28/2016   MASTECTOMY Left 2012   MASTOPEXY  02/14/2011   Procedure: MASTOPEXY;  Surgeon: Macon Large;  Location: [facility];  Service: Plastics;  Laterality: Bilateral;  Right Breast Reduction    MITRAL VALVE REPLACEMENT  2008   TISSUE EXPANDER PLACEMENT  02/14/2011   Procedure: TISSUE EXPANDER;  Surgeon: Macon Large;  Location: [facility];  Service: Plastics;  Laterality: Bilateral;  Left Breast Remove Tissue  Expander Placement of Implant Breast Reconstruction   TISSUE EXPANDER PLACEMENT Left 11/16/2010   breasst   TUBAL LIGATION       reports that she quit smoking about 13 years ago. Her smoking use included cigarettes. She has a 15.00 pack-year smoking history. She has never used smokeless tobacco. She reports previous alcohol use. She reports that she does not use drugs.  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 kph    Family History  Problem Relation Age of Onset   Hypertension Mother    Hyperlipidemia Mother    Hypertension Father    Colon cancer Neg Hx    Colon polyps Neg Hx     Prior to Admission medications   Medication Sig Start Date End Date Taking? Authorizing Provider  acetaminophen (TYLENOL) 325 MG tablet Take 2 tablets (650 mg total) by mouth every 6 (six) hours as needed for mild pain or headache (fever >/= 101). 11/11/19  Yes Barton Dubois, MD  albuterol (VENTOLIN HFA) 108 (90 Base) MCG/ACT inhaler Inhale 1-2 puffs into the lungs every 6 (six) hours as needed for wheezing.  05/07/19  Yes [provider]  chlorpheniramine-HYDROcodone (TUSSIONEX PENNKINETIC ER) 10-8 MG/5ML SUER Take 5 mLs by mouth every 12 (twelve) hours as needed for cough. 11/11/19  Yes Barton Dubois, MD  cyanocobalamin (,VITAMIN B-12,) 1000 MCG/ML injection Inject 1 mL (1,000 mcg total) into the muscle every 30 (thirty) days. 12/19/10  Yes Everardo All, MD  cycloSPORINE (RESTASIS) 0.05 % ophthalmic emulsion Place 1 drop into the left eye daily.   Yes [provider]  DEXILANT 60 MG capsule TAKE ONE CAPSULE BY MOUTH DAILY. Patient taking differently: Take 60 mg by mouth daily.  09/25/19  Yes Mahala Menghini, PA-C  famotidine (PEPCID) 40 MG tablet TAKE (1) TABLET BY MOUTH AT BEDTIME. Patient taking differently: Take 40 mg by mouth at bedtime.  05/21/19  Yes Erenest Rasher, PA-C  furosemide (LASIX) 40 MG tablet Take 0.5 tablets (20 mg total) by mouth daily. 11/11/19  Yes Barton Dubois, MD  ibuprofen (ADVIL,MOTRIN) 800 MG tablet Take 800 mg by mouth 2 (two) times daily as needed for mild pain or moderate pain. Reported on 08/30/2015   Yes [provider]  levothyroxine (SYNTHROID) 75 MCG tablet Take 1 tablet (75 mcg total) by mouth daily before breakfast. 07/15/19  Yes Nida, Marella Chimes, MD  magnesium oxide (MAG-OX) 400 (241.3 Mg) MG tablet Take 1 tablet (400 mg total) by mouth 2 (two) times daily. 11/11/19  Yes Barton Dubois, MD  metoCLOPramide (REGLAN) 5 MG tablet TAKE (1) TABLET BY MOUTH TWICE DAILY. Patient taking differently: Take 5 mg by mouth in the morning and at bedtime.  02/03/19  Yes Annitta Needs, NP  ondansetron (ZOFRAN) 4 MG tablet Take 1 tablet (4 mg total) by mouth every 8 (eight) hours as needed for nausea or vomiting. 10/09/17  Yes Annitta Needs, NP  potassium chloride (KLOR-CON 10) 10 MEQ tablet Take 10 mEq by mouth every evening. Take one tablet daily as directed 11/09/13  Yes [provider]  prednisoLONE acetate (PRED FORTE) 1 % ophthalmic suspension Place 1 drop into the left eye daily.  09/28/19  Yes [provider]  promethazine (PHENERGAN) 12.5 MG tablet Take 1 tablet (12.5 mg total) by mouth every 6 (six) hours as needed for nausea or vomiting. 10/09/17  Yes Annitta Needs, NP  riTUXimab (RITUXAN) 100 MG/10ML injection Inject 10 mLs (100 mg total) into the vein See admin instructions. Every 4 months; please hold next injection until discussion with rheumatology service 11/11/19  Yes Barton Dubois, MD  rOPINIRole (REQUIP) 1 MG tablet Take 1 mg by mouth at bedtime.   Yes [provider]  warfarin (COUMADIN) 5 MG tablet TAKE 1 & 1/2 TABLETS  BY MOUTH DAILY OR AS DIRECTED Patient taking differently: Take 7.5 mg by mouth daily.  01/12/19  Yes Satira Sark, MD  zolpidem (AMBIEN) 10 MG tablet Take 10 mg by mouth at bedtime.  12/19/10  Yes de Stanford Scotland, MD  bromfenac Claris Che) 0.09 % ophthalmic solution Place 1 drop into the left eye.  06/23/19   [provider]  buPROPion (WELLBUTRIN XL) 300 MG 24 hr tablet Take 300 mg by mouth daily. Patient not taking: Reported on 11/24/2019 10/21/19   [provider]  nepafenac (NEVANAC) 0.1 % ophthalmic suspension Place 1 drop into the left eye daily.  06/19/19   [provider]    Physical Exam: Vitals:   11/25/19 0159 11/25/19 0602 11/25/19 0725 11/25/19 1323  BP: 104/73 119/90    Pulse: 73 67    Resp: 18 18    Temp: 98.2 F (36.8 C) 97.7 F (36.5 C)    TempSrc: Oral Oral    SpO2: 93% (!) 84% 98% 94%  Weight:      Height:        Constitutional: Coughing, otherwise calm, comfortable Vitals:   11/25/19 0159 11/25/19 0602 11/25/19 0725 11/25/19 1323  BP: 104/73 119/90    Pulse: 73 67    Resp: 18 18    Temp: 98.2 F (36.8 C) 97.7 F (36.5 C)    TempSrc: Oral Oral    SpO2: 93% (!) 84% 98% 94%  Weight:      Height:       Eyes: PERRL, lids and conjunctivae normal ENMT: Mucous membranes are moist. Posterior pharynx clear of any exudate or lesions.Normal dentition.  Nose- 2L/min Neck: normal, supple, no masses, no thyromegaly Respiratory: Fair air movement, no significant wheezing Cardiovascular: Regular rate and rhythm, No extremity edema. 2+ pedal pulses.  Abdomen: no tenderness, no masses palpated. No hepatosplenomegaly. Bowel sounds positive.  Musculoskeletal: no clubbing / cyanosis. No joint deformity upper and lower extremities. Good ROM, no contractures. Normal muscle tone.  Skin: no rashes, lesions, ulcers. No induration Neurologic: No apparent cranial nerve abnormality, moving extremities spontaneously. Psychiatric: Normal judgment and  insight. Alert and oriented x 3. Normal mood.   Labs on Admission: I have personally reviewed following labs and imaging studies  CBC: Recent Labs  Lab 11/24/19 1636 11/25/19 0640  WBC 6.3 4.3  NEUTROABS 5.7 3.7  HGB 12.5 10.9*  HCT 37.3 33.3*  MCV 90.3  90.5  PLT 324 035   Basic Metabolic Panel: Recent Labs  Lab 11/24/19 1636 11/25/19 0640  NA 134* 134*  K 3.9 3.8  CL 96* 98  CO2 26 26  GLUCOSE 95 128*  BUN 18 24*  CREATININE 0.91 0.70  CALCIUM 8.8* 8.4*   Coagulation Profile: Recent Labs  Lab 11/24/19 1636 11/25/19 0640  INR 5.1* 6.7*    Radiological Exams on Admission: DG Chest Portable 1 View  Result Date: 11/24/2019 CLINICAL DATA:  Shortness of breath, history of breast cancer, recent COVID admission EXAM: PORTABLE CHEST 1 VIEW COMPARISON:  11/09/2019 FINDINGS: Bilateral pulmonary opacities are present, greatest in the lower left lung. This has increased since the prior study. No pleural effusion or pneumothorax. Stable cardiomediastinal contours with normal heart size. IMPRESSION: Bilateral pulmonary opacities, left greater than right. This is increased since 11/09/2019. Electronically Signed   By: Macy Mis M.D.   On: 11/24/2019 17:18    EKG: Independently reviewed.  Sinus rhythm, rate 84, QTc 430.  T wave abnormalities in V2 V3 1 and aVL, but these have not significantly changed from prior.  Assessment/Plan Principal Problem:   Acute respiratory failure with hypoxia (HCC) Active Problems:   History of mitral valve replacement with mechanical valve   Systemic lupus erythematosus (HCC)   Anticoagulation goal of INR 2.5 to 3.5   Pneumonia due to COVID-19 virus   Immunosuppression ([place])   Acute respiratory failure due to progression of Covid pneumonia -despite completing treatment with steroids and remdesivir. ---  Was not hypoxic during recent hospitalization.  Chest x-ray-shows worsening bilateral pulmonary opacities left greater than right.  Doubt  pulmonary embolism, patient is on warfarin, and her INR is supratherapeutic at 6.7.1.  D-dimer not significantly elevated at 0.63. - continue antibiotics IV ceftriaxone and azithromycin -Obtain inflammatory markers and trend -COVID-19 admission protocol -Mucolytic's, inhalers as needed -Patient has already completed course of remdesivir on recent hospitalization -Continue IV steroids -Attempted to wean off oxygen patient desaturates into the low to mid 80s on room air,  -back on 2 L of oxygen via nasal cannula  Rheumatoid arthritis, SLE, immunosuppression-stable. - Hold rituximab -As she has been off her immunosuppressants for a while, it may be appropriate to ask her rheumatologist on discharge if she can get the Covid vaccine.  Mitral valve replacement with mechanical valve, supratherapeutic INR -INR- 6.7  Her anticoagulation goal INR is 2.5-3.5.  Hemoglobin is stable  no sign of bleeding. -Hold warfarin for now, will allow INR to trend down without intervention -Daily PT/INR  Gastroparesis -Resume home metoclopramide  Hypothyroidism -Resume home Synthroid  SupraTherapeutic INR-- INR is 2.5-3.5.,  INR is currently 6.7, and appears to be on the uptrend --give Vit K 2.5 mg po x 1, watch for bleeding -Discussed with pharmacist  DVT prophylaxis: SCDs, supratherapeutic INR, please resume anticoagulation when appropriate Code Status: Full code Family Communication: Updated patient's daughter at patient's request, patient's daughter is an Therapist, sports at this facility Disposition Plan: ~Possible discharge on 11/26/2019 with home O2 Consults called: None  Disposition/Need for in-Hospital Stay- patient unable to be discharged at this time due to --hypoxic respiratory failure requiring IV steroids and supplemental oxygen and supratherapeutic INR requiring further monitoring* Patient From: home D/C Place: home Barriers: Not Clinically Stable- -   Roxan Hockey MD [facility]  11/25/2019, 3:52 PM

## 2019-11-26 LAB — PROTIME-INR
INR: 4.1 (ref 0.8–1.2)
Prothrombin Time: 38.8 seconds — ABNORMAL HIGH (ref 11.4–15.2)

## 2019-11-26 LAB — D-DIMER, QUANTITATIVE: D-Dimer, Quant: 0.36 ug/mL-FEU (ref 0.00–0.50)

## 2019-11-26 MED ORDER — ALBUTEROL SULFATE HFA 108 (90 BASE) MCG/ACT IN AERS: 2.0000 | INHALATION_SPRAY | Freq: Three times a day (TID) | RESPIRATORY_TRACT | 0 refills | Status: DC

## 2019-11-26 MED ORDER — AZITHROMYCIN 500 MG PO TABS: 500.0000 mg | ORAL_TABLET | Freq: Every day | ORAL | 0 refills | Status: AC

## 2019-11-26 MED ORDER — WARFARIN SODIUM 5 MG PO TABS: 5.0000 mg | ORAL_TABLET | Freq: Every day | ORAL | 3 refills | Status: AC

## 2019-11-26 MED ORDER — HYDROCOD POLST-CPM POLST ER 10-8 MG/5ML PO SUER
5.0000 mL | Freq: Two times a day (BID) | ORAL | Status: DC | PRN
  Administered 2019-11-26: 5 mL via ORAL
  Filled 2019-11-26: qty 5

## 2019-11-26 MED ORDER — ZINC SULFATE 220 (50 ZN) MG PO CAPS
220.0000 mg | ORAL_CAPSULE | Freq: Every day | ORAL | 1 refills | Status: DC
Start: 2019-11-27 — End: 2019-12-04

## 2019-11-26 MED ORDER — POLYETHYLENE GLYCOL 3350 17 G PO PACK: 17.0000 g | PACK | Freq: Every day | ORAL | 0 refills | Status: DC | PRN

## 2019-11-26 MED ORDER — ASCORBIC ACID 500 MG PO TABS
500.0000 mg | ORAL_TABLET | Freq: Every day | ORAL | 1 refills | Status: DC
Start: 2019-11-27 — End: 2019-12-04

## 2019-11-26 MED ORDER — PREDNISONE 10 MG PO TABS: 10.0000 mg | ORAL_TABLET | ORAL | 0 refills | Status: DC

## 2019-11-26 MED ORDER — HYDROCOD POLST-CPM POLST ER 10-8 MG/5ML PO SUER
5.0000 mL | Freq: Two times a day (BID) | ORAL | 0 refills | Status: DC | PRN
Start: 2019-11-26 — End: 2019-12-04

## 2019-11-26 NOTE — Plan of Care (Signed)
  Problem: Education: Goal: Knowledge of General Education information will improve Description: Including pain rating scale, medication(s)/side effects and non-pharmacologic comfort measures 11/26/2019 1414 by Cameron Ali, RN Outcome: Completed/Met 11/26/2019 1143 by Cameron Ali, RN Outcome: Progressing   Problem: Health Behavior/Discharge Planning: Goal: Ability to manage health-related needs will improve 11/26/2019 1414 by Cameron Ali, RN Outcome: Completed/Met 11/26/2019 1143 by Cameron Ali, RN Outcome: Progressing   Problem: Clinical Measurements: Goal: Ability to maintain clinical measurements within normal limits will improve 11/26/2019 1414 by Cameron Ali, RN Outcome: Completed/Met 11/26/2019 1143 by Cameron Ali, RN Outcome: Progressing Goal: Will remain free from infection 11/26/2019 1414 by Cameron Ali, RN Outcome: Completed/Met 11/26/2019 1143 by Cameron Ali, RN Outcome: Progressing Goal: Diagnostic test results will improve 11/26/2019 1414 by Cameron Ali, RN Outcome: Completed/Met 11/26/2019 1143 by Cameron Ali, RN Outcome: Progressing Goal: Respiratory complications will improve 11/26/2019 1414 by Cameron Ali, RN Outcome: Completed/Met 11/26/2019 1143 by Cameron Ali, RN Outcome: Progressing Goal: Cardiovascular complication will be avoided 11/26/2019 1414 by Cameron Ali, RN Outcome: Completed/Met 11/26/2019 1143 by Cameron Ali, RN Outcome: Progressing   Problem: Activity: Goal: Risk for activity intolerance will decrease 11/26/2019 1414 by Cameron Ali, RN Outcome: Completed/Met 11/26/2019 1143 by Cameron Ali, RN Outcome: Progressing   Problem: Nutrition: Goal: Adequate nutrition will be maintained 11/26/2019 1414 by Cameron Ali, RN Outcome: Completed/Met 11/26/2019 1143 by Cameron Ali, RN Outcome: Progressing   Problem: Coping: Goal: Level of anxiety will  decrease 11/26/2019 1414 by Cameron Ali, RN Outcome: Completed/Met 11/26/2019 1143 by Cameron Ali, RN Outcome: Progressing   Problem: Elimination: Goal: Will not experience complications related to bowel motility 11/26/2019 1414 by Cameron Ali, RN Outcome: Completed/Met 11/26/2019 1143 by Cameron Ali, RN Outcome: Progressing Goal: Will not experience complications related to urinary retention 11/26/2019 1414 by Cameron Ali, RN Outcome: Completed/Met 11/26/2019 1143 by Cameron Ali, RN Outcome: Progressing   Problem: Pain Managment: Goal: General experience of comfort will improve 11/26/2019 1414 by Cameron Ali, RN Outcome: Completed/Met 11/26/2019 1143 by Cameron Ali, RN Outcome: Progressing   Problem: Safety: Goal: Ability to remain free from injury will improve 11/26/2019 1414 by Cameron Ali, RN Outcome: Completed/Met 11/26/2019 1143 by Cameron Ali, RN Outcome: Progressing   Problem: Skin Integrity: Goal: Risk for impaired skin integrity will decrease 11/26/2019 1414 by Cameron Ali, RN Outcome: Completed/Met 11/26/2019 1143 by Cameron Ali, RN Outcome: Progressing

## 2019-11-26 NOTE — Progress Notes (Signed)
Patient ambulated to bathroom and urinated 700 mL of yellow urine.

## 2019-11-26 NOTE — Discharge Instructions (Signed)
1)You are taking Warfarin/Coumadin which is a blood Thinner so Please Avoid ibuprofen/Advil/Aleve/Motrin/Goody Powders/Naproxen/BC powders/Meloxicam/Diclofenac/Indomethacin and other Nonsteroidal anti-inflammatory medications as these will make you more likely to bleed and can cause stomach ulcers, can also cause Kidney problems.   2)Prednisone Taper---Take 40 mg (4tab) daily for 4 days, then 30 mg daily for 4 days, then 20 mg (2 tab) daily for 4 days , then 10 mg (1 Tab) daily for 4 days, then STOP  3) You are strongly advised to  to isolate/quarantine for at least 28 days from the date of your diagnosis  with COVID-19 infection-- (28 days from 11/09/19)----Please always wear a mask if you have to go outside the house  4)Please Restart Coumadin at 5mg  every evening starting today 11/26/19---  5)Please Recheck PT/INR Test on Friday 12/13/2019 or Monday 11/30/2019  6)you need oxygen at home at 3 L via nasal cannula continuously while awake and while asleep--- smoking or having open fires around oxygen can cause fire, significant injury and death

## 2019-11-26 NOTE — Progress Notes (Signed)
Patient ambulated to bathroom and urinated 300 mL of clear, yellow urine.

## 2019-11-26 NOTE — Plan of Care (Signed)

## 2019-11-26 NOTE — TOC Transition Note (Signed)
Transition of Care [facility]) - CM/SW Discharge Note   Patient Details  Name: [name] MRN: [id number] Date of Birth: 1970/03/04  [facility]) CM/SW Contact:  Ihor Gully, LCSW Phone Number: 11/26/2019, 10:17 AM   Clinical Narrative:    Patient COVID+, in need of home oygen. Discussed DME providers. Referral made for home oxygen.    Final next level of care: Home/Self Care Barriers to Discharge: No Barriers Identified   Patient Goals and CMS Choice        Discharge Placement                       Discharge Plan and Services                DME Arranged: Oxygen DME Agency: [facility] Date DME Agency Contacted: 11/26/19 Time DME Agency Contacted: [phone] Representative spoke with at DME Agency: [place]            Social Determinants of Health ([place]) Interventions     Readmission Risk Interventions No flowsheet data found.

## 2019-11-26 NOTE — Progress Notes (Signed)
Lab tech, unit staff nurses, and ED nurse are unable to get blood return for labs.

## 2019-11-26 NOTE — Progress Notes (Signed)
SATURATION QUALIFICATIONS: (This note is used to comply with regulatory documentation for home oxygen)  Patient Saturations on Room Air at Rest = 82%  Patient Saturations on Room Air while Ambulating = n/a  Patient Saturations on 2 Liters of oxygen while Ambulating = 92%  Please briefly explain why patient needs home oxygen:  Pt oxygenation dropped at 82% at rest on room air and had short of breath.

## 2019-11-26 NOTE — Discharge Summary (Signed)
[name], is a 49 y.o. female  DOB 1970-11-25  MRN [id number].  Admission date:  11/24/2019  Admitting Physician  Bethena Roys, MD  Discharge Date:  11/26/2019   Primary MD  Sharilyn Sites, MD  Recommendations for primary care physician for things to follow:   1)You are taking Warfarin/Coumadin which is a blood Thinner so Please Avoid ibuprofen/Advil/Aleve/Motrin/Goody Powders/Naproxen/BC powders/Meloxicam/Diclofenac/Indomethacin and other Nonsteroidal anti-inflammatory medications as these will make you more likely to bleed and can cause stomach ulcers, can also cause Kidney problems.   2)Prednisone Taper---Take 40 mg (4tab) daily for 4 days, then 30 mg daily for 4 days, then 20 mg (2 tab) daily for 4 days , then 10 mg (1 Tab) daily for 4 days, then STOP  3) You are strongly advised to  to isolate/quarantine for at least 28 days from the date of your diagnosis  with COVID-19 infection-- (28 days from 11/09/19)----Please always wear a mask if you have to go outside the house  4)Please Restart Coumadin at 46m every evening starting today 11/26/19---  5)Please Recheck PT/INR Test on Friday 12/17/2019 or Monday 11/30/2019  6)you need oxygen at home at 3 L via nasal cannula continuously while awake and while asleep--- smoking or having open fires around oxygen can cause fire, significant injury and death Admission Diagnosis  Acute respiratory failure with hypoxia ([facility] [J96.01]   Discharge Diagnosis  Acute respiratory failure with hypoxia ([facility] [J96.01]    Principal Problem:   Acute respiratory failure with hypoxia ([place] Active Problems:   History of mitral valve replacement with mechanical valve   Systemic lupus erythematosus (HCC)   Anticoagulation goal of INR 2.5 to 3.5   Pneumonia due to COVID-19 virus   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 Abnormal  Papanicolaou smear of cervix with positive human papilloma virus (HPV) test 08/17/2015  . Anticoagulation goal of INR 2.5 to 3.5   . Anxiety   . B12 deficiency   . BRCA1 negative   . BRCA2 negative   . Breast cancer, left breast ([place] 2011   S/P mastectomy; chemo; "no radiation due to lupus"  . Bursitis of right knee    Septic bursitis  . Chronic lower back pain   . Drug-induced hepatitis    States per her rheumatologist, transaminases elevated but normalized after drug removed for   . Fibromyalgia   . GERD (gastroesophageal reflux disease)   . 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 History of abnormal cervical Pap smear 08/12/2015  . History of blood transfusion   . HSIL (high grade squamous intraepithelial lesion) on Pap smear of cervix 01/14/2017   colpo with Dr EElonda Husky  . Hypothyroidism   . Mitral valve disease, rheumatic    St. Jude prosthesis  . Peripheral neuropathy   . Pneumonia   . Raynaud phenomenon   . Rheumatoid arthritis ([facility]   . SLE (systemic lupus erythematosus) (HCC)     Past Surgical  History:  Procedure Laterality Date  . APPENDECTOMY N/A 05/23/2016   Procedure: OPEN APPENDECTOMY WITH DRAINAGE OF ABSCESS;  Surgeon: Fanny Skates, MD;  Location: [facility];  Service: General;  Laterality: N/A;  . APPENDECTOMY    . BREAST BIOPSY Left 2012  . BREAST IMPLANT EXCHANGE Left 12/2015  . CARDIAC CATHETERIZATION  2008  . CARDIAC VALVE REPLACEMENT    . CERVICAL CONIZATION W/BX N/A 03/06/2017   Procedure: Laser Conization of Cervix;  Surgeon: Florian Buff, MD;  Location: [facility];  Service: Gynecology;  Laterality: N/A;  . CORNEAL TRANSPLANT Left   . ENDOMETRIAL ABLATION  2008   She no longer has menses  . ESOPHAGOGASTRODUODENOSCOPY N/A 03/25/2013   Dr. Raliegh Scarlet reflux esophagitis-likely source of patient's symptoms (patulous EG junction). Hiatal hernia otherwise normal  . LAPAROSCOPIC CHOLECYSTECTOMY    . LIVER BIOPSY  11/28/2016   liver  core/notes 11/28/2016  . MASTECTOMY Left 2012  . MASTOPEXY  02/14/2011   Procedure: MASTOPEXY;  Surgeon: Macon Large;  Location: [facility];  Service: Plastics;  Laterality: Bilateral;  Right Breast Reduction   . MITRAL VALVE REPLACEMENT  2008  . TISSUE EXPANDER PLACEMENT  02/14/2011   Procedure: TISSUE EXPANDER;  Surgeon: Macon Large;  Location: [facility];  Service: Plastics;  Laterality: Bilateral;  Left Breast Remove Tissue Expander Placement of Implant Breast Reconstruction  . TISSUE EXPANDER PLACEMENT Left 11/16/2010   breasst  . TUBAL LIGATION       HPI  from the history and physical done on the day of admission:    Chief Complaint: Increasing difficulty breathing  HPI: [name] is a 49 y.o. female with medical history significant for SLE, rheumatoid arthritis, Raynaud's, mitral valve replacement on chronic anticoagulation. Patient was recently admitted 9/20 - 9/22 for Covid pneumonia, she also had GI symptoms of diarrhea, she was not hypoxic, she was treated with remdesivir and steroids.  Since going home, patient's difficulty breathing persisted and worsened especially with exertion, with persistent cough intermittently productive.  Patient's daughter is a Marine scientist.  O2 sats dropped to 80s at home today hence patient presented back to the ED.  Patient completed course of outpatient remdesivir x 2 days.  Patient is unable to get COVID Vaccine due to immunosuppression.  ED Course: temp 98.6.  Respiratory rate 20-29, blood pre 26.  Ssure 98-108, O2 sat 87% on room air, improved 99% on 2 L O2.  WBC 6.3.  Unremarkable BMP.  Lactic acid 2 > 1.6.  INR elevated 5.1.  Portable chest x-ray showing worsening bilateral pulmonary opacities.  IV ceftriaxone and azithromycin started.  Hospitalist to admit for acute respiratory failure.     Hospital Course:         Acute respiratory failure due to progression of Covid pneumonia -despite completing treatment with steroids and remdesivir. ---   Was not hypoxic during recent hospitalization.  Chest x-ray- this admission shows worsening bilateral pulmonary opacities left greater than right.  Low clinical index of suspicion for pulmonary embolism, patient is on warfarin, and her INR is supratherapeutic - D-dimer not significantly elevated at 0.63. -Treated empirically with IV ceftriaxone and azithromycin for possible superimposed bacterial infection, okay to discharge on azithromycin -COVID-19 admission protocol -Mucolytic's, inhalers as needed -Patient has already completed course of remdesivir on recent hospitalization Treated with IV steroids, discharged on very slow prednisone taper -Hypoxia persist, patient requiring about 2 L of oxygen at rest and up to 3 L with activity  Rheumatoid arthritis, SLE, immunosuppression-stable. -  Hold rituximab -As she has been off her immunosuppressants for a while, patient should discuss with her rheumatologist on discharge if she can get the Covid vaccine.  Mitral valve replacement with mechanical valve, supratherapeutic INR -INR- 6.7  Her anticoagulation goal INR is 2.5-3.5.  Hemoglobin is stable  no sign of bleeding. -INR was supratherapeutic--peaking at 6.7 on 11/25/2019 -Coumadin was held patient received vitamin K 2.5 mg x 1 -INR down to 4.1 okay to restart Coumadin at 5 mg daily recheck INR advised  Gastroparesis Continue home metoclopramide  Hypothyroidism Continue home Synthroid  Code Status: Full code Family Communication: Updated patient's daughter at patient's request, patient's daughter is an Therapist, sports at this facility Disposition Plan: ~Discharge home with with home O2 Consults called: None  Patient From: home D/C Place: home  Discharge Condition: Stable on 2 to 3 L of oxygen by nasal cannula  Follow UP--- outpatient follow-up for INR check  Diet and Activity recommendation:  As advised  Discharge Instructions    Discharge Instructions    Call MD for:  difficulty  breathing, headache or visual disturbances   Complete by: As directed    Call MD for:  persistant dizziness or light-headedness   Complete by: As directed    Call MD for:  persistant nausea and vomiting   Complete by: As directed    Call MD for:  severe uncontrolled pain   Complete by: As directed    Call MD for:  temperature >100.4   Complete by: As directed    Diet - low sodium heart healthy   Complete by: As directed    Discharge instructions   Complete by: As directed    1)You are taking Warfarin/Coumadin which is a blood Thinner so Please Avoid ibuprofen/Advil/Aleve/Motrin/Goody Powders/Naproxen/BC powders/Meloxicam/Diclofenac/Indomethacin and other Nonsteroidal anti-inflammatory medications as these will make you more likely to bleed and can cause stomach ulcers, can also cause Kidney problems.   2)Prednisone Taper---Take 40 mg (4tab) daily for 4 days, then 30 mg daily for 4 days, then 20 mg (2 tab) daily for 4 days , then 10 mg (1 Tab) daily for 4 days, then STOP  3) You are strongly advised to  to isolate/quarantine for at least 28 days from the date of your diagnosis  with COVID-19 infection-- (28 days from 11/09/19)----Please always wear a mask if you have to go outside the house  4)Please Restart Coumadin at 44m every evening starting today 11/26/19---  5)Please Recheck PT/INR Test on Friday 11/25/2019 or Monday 11/30/2019  6)you need oxygen at home at 3 L via nasal cannula continuously while awake and while asleep--- smoking or having open fires around oxygen can cause fire, significant injury and death   Increase activity slowly   Complete by: As directed       Discharge Medications     Allergies as of 11/26/2019      Reactions   Magnesium-containing Compounds Other (See Comments)   Chest pain to IV mag   Methotrexate Derivatives Other (See Comments)   Raised liver enzymes    Other    04/26/16:  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 kph      Medication List    STOP taking these medications   famotidine 40 MG tablet Commonly known as: PEPCID   ibuprofen 800 MG tablet Commonly known as: ADVIL   magnesium oxide 400 (241.3 Mg) MG tablet Commonly known as: MAG-OX  TAKE these medications   acetaminophen 325 MG tablet Commonly known as: TYLENOL Take 2 tablets (650 mg total) by mouth every 6 (six) hours as needed for mild pain or headache (fever >/= 101).   albuterol 108 (90 Base) MCG/ACT inhaler Commonly known as: VENTOLIN HFA Inhale 2 puffs into the lungs 3 (three) times daily. What changed:   how much to take  when to take this  reasons to take this   ascorbic acid 500 MG tablet Commonly known as: VITAMIN C Take 1 tablet (500 mg total) by mouth daily. Start taking on: November 27, 2019   azithromycin 500 MG tablet Commonly known as: ZITHROMAX Take 1 tablet (500 mg total) by mouth daily for 5 days.   bromfenac 0.09 % ophthalmic solution Commonly known as: XIBROM Place 1 drop into the left eye.   buPROPion 300 MG 24 hr tablet Commonly known as: WELLBUTRIN XL Take 300 mg by mouth daily.   chlorpheniramine-HYDROcodone 10-8 MG/5ML Suer Commonly known as: Tussionex Pennkinetic ER Take 5 mLs by mouth every 12 (twelve) hours as needed for cough.   cyanocobalamin 1000 MCG/ML injection Commonly known as: (VITAMIN B-12) Inject 1 mL (1,000 mcg total) into the muscle every 30 (thirty) days.   cycloSPORINE 0.05 % ophthalmic emulsion Commonly known as: RESTASIS Place 1 drop into the left eye daily.   Dexilant 60 MG capsule Generic drug: dexlansoprazole TAKE ONE CAPSULE BY MOUTH DAILY. What changed: how much to take   furosemide 40 MG tablet Commonly known as: LASIX Take 0.5 tablets (20 mg total) by mouth daily.   Klor-Con 10 10 MEQ tablet Generic drug: potassium chloride Take 10 mEq by mouth every evening. Take one tablet daily as directed     levothyroxine 75 MCG tablet Commonly known as: SYNTHROID Take 1 tablet (75 mcg total) by mouth daily before breakfast.   metoCLOPramide 5 MG tablet Commonly known as: REGLAN TAKE (1) TABLET BY MOUTH TWICE DAILY. What changed: See the new instructions.   nepafenac 0.1 % ophthalmic suspension Commonly known as: [place] 1 drop into the left eye daily.   ondansetron 4 MG tablet Commonly known as: ZOFRAN Take 1 tablet (4 mg total) by mouth every 8 (eight) hours as needed for nausea or vomiting.   polyethylene glycol 17 g packet Commonly known as: MIRALAX / GLYCOLAX Take 17 g by mouth daily as needed for mild constipation.   prednisoLONE acetate 1 % ophthalmic suspension Commonly known as: PRED FORTE Place 1 drop into the left eye daily.   predniSONE 10 MG tablet Commonly known as: DELTASONE Take 1 tablet (10 mg total) by mouth See admin instructions. Take 40 mg (4tab) daily for 4 days, then 30 mg daily for 4 days, then 20 mg (2 tab) daily for 4 days , then 10 mg (1 Tab) daily for 4 days, then STOP   promethazine 12.5 MG tablet Commonly known as: PHENERGAN Take 1 tablet (12.5 mg total) by mouth every 6 (six) hours as needed for nausea or vomiting.   riTUXimab 100 MG/10ML injection Commonly known as: RITUXAN Inject 10 mLs (100 mg total) into the vein See admin instructions. Every 4 months; please hold next injection until discussion with rheumatology service   rOPINIRole 1 MG tablet Commonly known as: REQUIP Take 1 mg by mouth at bedtime.   warfarin 5 MG tablet Commonly known as: COUMADIN Take as directed. If you are unsure how to take this medication, talk to your nurse or doctor. Original instructions: Take 1  tablet (5 mg total) by mouth daily.   zinc sulfate 220 (50 Zn) MG capsule Take 1 capsule (220 mg total) by mouth daily. Start taking on: November 27, 2019   zolpidem 10 MG tablet Commonly known as: AMBIEN Take 10 mg by mouth at bedtime.             Durable Medical Equipment  (From admission, onward)         Start     Ordered   11/26/19 1115  For home use only DME oxygen  Once       Comments: SATURATION QUALIFICATIONS: (Thisnote is usedto comply with regulatory documentation for home oxygen)  Patient Saturations on Room Air at Rest =83 %  Patient Saturations on Room Air while Ambulating =80%  Patient Saturations on3Liters of oxygen while Ambulating = 92 %    Patient needs continuous O2 at 3 L/min continuously via nasal cannula with humidifier, with gaseous portability and conserving device    Diagnosis---Covid infection  Roxan Hockey, MD  Question Answer Comment  Length of Need Lifetime   Mode or (Route) Nasal cannula   Liters per Minute 3   Frequency Continuous (stationary and portable oxygen unit needed)   Oxygen conserving device Yes   Oxygen delivery system Gas      11/26/19 1114          Major procedures and Radiology Reports - PLEASE review detailed and final reports for all details, in brief -   CT Head Wo Contrast  Result Date: 11/09/2019 CLINICAL DATA:  Pain following recent fall EXAM: CT HEAD WITHOUT CONTRAST TECHNIQUE: Contiguous axial images were obtained from the base of the skull through the vertex without intravenous contrast. COMPARISON:  None. FINDINGS: Brain: The ventricles and sulci are normal in size and configuration. There is no intracranial mass, hemorrhage, extra-axial fluid collection, or midline shift. The brain parenchyma appears normal. There is no demonstrable acute infarct. Vascular: No hyperdense vessel. No appreciable vascular calcification. Skull: The bony calvarium appears intact. Sinuses/Orbits: There is mucosal thickening in the maxillary antra bilaterally, more severe on the right than on the left. There is opacification of multiple ethmoid air cells. There is opacification in the frontal sinuses bilaterally. Orbits appear symmetric bilaterally. Other: The mastoid air  cells are clear. IMPRESSION: Brain parenchyma appears unremarkable.  No mass or hemorrhage. Multifocal paranasal sinus disease noted. Electronically Signed   By: Lowella Grip III M.D.   On: 11/09/2019 09:39   DG Chest Portable 1 View  Result Date: 11/24/2019 CLINICAL DATA:  Shortness of breath, history of breast cancer, recent COVID admission EXAM: PORTABLE CHEST 1 VIEW COMPARISON:  11/09/2019 FINDINGS: Bilateral pulmonary opacities are present, greatest in the lower left lung. This has increased since the prior study. No pleural effusion or pneumothorax. Stable cardiomediastinal contours with normal heart size. IMPRESSION: Bilateral pulmonary opacities, left greater than right. This is increased since 11/09/2019. Electronically Signed   By: Macy Mis M.D.   On: 11/24/2019 17:18   DG Chest Port 1 View  Result Date: 11/09/2019 CLINICAL DATA:  Cough and fever for the past week. EXAM: PORTABLE CHEST 1 VIEW COMPARISON:  Chest x-ray dated May 21, 2016. FINDINGS: The heart size and mediastinal contours are within normal limits. Normal pulmonary vascularity. Similar retained epicardial pacing wires overlying the right chest. New patchy opacities at the peripheral right lung base. No focal consolidation, pleural effusion, or pneumothorax. No acute osseous abnormality. IMPRESSION: 1. Right lower lobe pneumonia with appearance concerning for atypical infection,  including viral pneumonia. Electronically Signed   By: Titus Dubin M.D.   On: 11/09/2019 09:04    Micro Results   No results found for this or any previous visit (from the past 240 hour(s)).   Today   Subjective    Yamel Bale today has no new concerns --Cough and hypoxia is not worse -Voided without dysuria or hematuria          Patient has been seen and examined prior to discharge   Objective   Blood pressure 112/85, pulse (!) 55, temperature 97.7 F (36.5 C), temperature source Oral, resp. rate 18, height 5' 4"  (1.626  m), weight 67.6 kg, SpO2 96 %.   Intake/Output Summary (Last 24 hours) at 11/26/2019 1417 Last data filed at 11/26/2019 0952 Gross per 24 hour  Intake 240 ml  Output 300 ml  Net -60 ml    Exam Gen:- Awake Alert, no acute distress, no conversational dyspnea HEENT:- [place].AT, No sclera icterus Nose- [place] 3L/min Neck-Supple Neck,No JVD,.  Lungs-improved air movement, no wheezing  CV- S1, S2 normal, regular Abd-  +ve B.Sounds, Abd Soft, No tenderness,    Extremity/Skin:- No  edema,   good pulses Psych-affect is appropriate, oriented x3 Neuro-no new focal deficits, no tremors    Data Review   CBC w Diff:  Lab Results  Component Value Date   WBC 4.3 11/25/2019   HGB 10.9 (L) 11/25/2019   HGB 11.6 03/18/2017   HCT 33.3 (L) 11/25/2019   HCT 35.2 03/18/2017   PLT 303 11/25/2019   PLT 252 03/18/2017   LYMPHOPCT 8 11/25/2019   MONOPCT 5 11/25/2019   EOSPCT 0 11/25/2019   BASOPCT 0 11/25/2019    CMP:  Lab Results  Component Value Date   NA 134 (L) 11/25/2019   K 3.8 11/25/2019   CL 98 11/25/2019   CO2 26 11/25/2019   BUN 24 (H) 11/25/2019   CREATININE 0.70 11/25/2019   CREATININE 0.66 10/09/2016   PROT 6.7 11/25/2019   ALBUMIN 3.0 (L) 11/25/2019   BILITOT 0.7 11/25/2019   ALKPHOS 66 11/25/2019   AST 26 11/25/2019   ALT 23 11/25/2019  .   Total Discharge time is about 33 minutes  Roxan Hockey M.D on 11/26/2019 at 2:17 PM  Go to [email or web address] -  for contact info  [facility] - Office  [phone]

## 2019-11-26 NOTE — Progress Notes (Signed)
CRITICAL VALUE ALERT  Critical Value:  INR 4.1 (trending down from 6.7 per lab report.)   Date & Time Notied:  11/26/19 1056  Provider Notified: Dr. Roxan Hockey  Orders Received/Actions taken: None at this time.

## 2019-11-26 NOTE — Progress Notes (Signed)
     SATURATION QUALIFICATIONS: (This note is used to comply with regulatory documentation for home oxygen)   Patient Saturations on Room Air at Rest = 83 %   Patient Saturations on Room Air while Ambulating = 80%   Patient Saturations on 3 Liters of oxygen while Ambulating = 92 %       Patient needs continuous O2 at 3 L/min continuously via nasal cannula with humidifier, with gaseous portability and conserving device    Diagnosis---Covid infection  Roxan Hockey, MD

## 2019-11-26 NOTE — Progress Notes (Addendum)
ANTICOAGULATION CONSULT NOTE -   Pharmacy Consult for warfarin dosing  Indication: mitral valve replacement with mechanical valve   Allergies  Allergen Reactions  . Magnesium-Containing Compounds Other (See Comments)    Chest pain to IV mag  . Methotrexate Derivatives Other (See Comments)    Raised liver enzymes   . 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 kph      Patient Measurements: Last Weight  Most recent update: 11/24/2019 10:34 PM   Weight  67.6 kg (149 lb 0.5 oz)           Body mass index is 25.58 kg/m. [name]               BP: 112/85 (10/07 0556) Pulse Rate: 55 (10/07 0556)  Labs: Recent Labs    11/24/19 1636 11/25/19 0640 11/26/19 0642  HGB 12.5 10.9*  --   HCT 37.3 33.3*  --   PLT 324 303  --   LABPROT 46.0* 56.2* 38.8*  INR 5.1* 6.7* 4.1*  CREATININE 0.91 0.70  --     Estimated Creatinine Clearance: 78.7 mL/min (by C-G formula based on SCr of 0.7 mg/dL).     Medications:  Facility-Administered Medications Prior to Admission  Medication Dose Route Frequency Provider Last Rate Last Admin  . chlorpheniramine-HYDROcodone (TUSSIONEX) 10-8 MG/5ML suspension 5 mL  5 mL Oral Q12H PRN Barton Dubois, MD       Medications Prior to Admission  Medication Sig Dispense Refill Last Dose  . acetaminophen (TYLENOL) 325 MG tablet Take 2 tablets (650 mg total) by mouth every 6 (six) hours as needed for mild pain or headache (fever >/= 101).   11/24/2019 at Unknown time  . albuterol (VENTOLIN HFA) 108 (90 Base) MCG/ACT inhaler Inhale 1-2 puffs into the lungs every 6 (six) hours as needed for wheezing.    11/24/2019 at Unknown time  . chlorpheniramine-HYDROcodone (TUSSIONEX PENNKINETIC ER) 10-8 MG/5ML SUER Take 5 mLs by mouth every 12 (twelve) hours as needed for cough. 280 mL 0 11/24/2019 at Unknown time  . cyanocobalamin (,VITAMIN B-12,) 1000 MCG/ML injection  Inject 1 mL (1,000 mcg total) into the muscle every 30 (thirty) days. 10 mL prn   . cycloSPORINE (RESTASIS) 0.05 % ophthalmic emulsion Place 1 drop into the left eye daily.   11/24/2019 at Unknown time  . DEXILANT 60 MG capsule TAKE ONE CAPSULE BY MOUTH DAILY. (Patient taking differently: Take 60 mg by mouth daily. ) 90 capsule 3 11/24/2019 at Unknown time  . famotidine (PEPCID) 40 MG tablet TAKE (1) TABLET BY MOUTH AT BEDTIME. (Patient taking differently: Take 40 mg by mouth at bedtime. ) 90 tablet 1 11/23/2019 at Unknown time  . furosemide (LASIX) 40 MG tablet Take 0.5 tablets (20 mg total) by mouth daily.   11/24/2019 at Unknown time  . ibuprofen (ADVIL,MOTRIN) 800 MG tablet Take 800 mg by mouth 2 (two) times daily as needed for mild pain or moderate pain. Reported on 08/30/2015   11/24/2019 at Unknown time  . levothyroxine (SYNTHROID) 75 MCG tablet Take 1 tablet (75 mcg total) by mouth daily before breakfast. 95 tablet 1 11/24/2019 at Unknown time  . magnesium oxide (MAG-OX) 400 (241.3 Mg) MG tablet Take 1 tablet (400 mg total) by mouth 2 (two) times daily. 30 tablet 1 Past Month at Unknown time  . metoCLOPramide (REGLAN) 5 MG tablet TAKE (1)  TABLET BY MOUTH TWICE DAILY. (Patient taking differently: Take 5 mg by mouth in the morning and at bedtime. ) 180 tablet 2 Past Month at Unknown time  . ondansetron (ZOFRAN) 4 MG tablet Take 1 tablet (4 mg total) by mouth every 8 (eight) hours as needed for nausea or vomiting. 60 tablet 11 Past Week at Unknown time  . potassium chloride (KLOR-CON 10) 10 MEQ tablet Take 10 mEq by mouth every evening. Take one tablet daily as directed   11/24/2019 at Unknown time  . prednisoLONE acetate (PRED FORTE) 1 % ophthalmic suspension Place 1 drop into the left eye daily.    11/24/2019 at Unknown time  . promethazine (PHENERGAN) 12.5 MG tablet Take 1 tablet (12.5 mg total) by mouth every 6 (six) hours as needed for nausea or vomiting. 60 tablet 11 11/24/2019 at Unknown time  .  riTUXimab (RITUXAN) 100 MG/10ML injection Inject 10 mLs (100 mg total) into the vein See admin instructions. Every 4 months; please hold next injection until discussion with rheumatology service 10 mL  Past Month at Unknown time  . rOPINIRole (REQUIP) 1 MG tablet Take 1 mg by mouth at bedtime.   11/23/2019 at Unknown time  . warfarin (COUMADIN) 5 MG tablet TAKE 1 & 1/2 TABLETS BY MOUTH DAILY OR AS DIRECTED (Patient taking differently: Take 7.5 mg by mouth daily. ) 135 tablet 3 11/24/2019 at Unknown time  . zolpidem (AMBIEN) 10 MG tablet Take 10 mg by mouth at bedtime.    11/23/2019 at Unknown time  . bromfenac (XIBROM) 0.09 % ophthalmic solution Place 1 drop into the left eye.      Marland Kitchen buPROPion (WELLBUTRIN XL) 300 MG 24 hr tablet Take 300 mg by mouth daily. (Patient not taking: Reported on 11/24/2019)   Not Taking at Unknown time  . nepafenac (NEVANAC) 0.1 % ophthalmic suspension Place 1 drop into the left eye daily.       Scheduled:  . albuterol  2 puff Inhalation Q6H  . vitamin C  500 mg Oral Daily  . cycloSPORINE  1 drop Left Eye Daily  . famotidine  40 mg Oral QHS  . furosemide  20 mg Oral Daily  . levothyroxine  75 mcg Oral Q0600  . methylPREDNISolone (SOLU-MEDROL) injection  40 mg Intravenous Q12H  . metoCLOPramide  5 mg Oral BID  . pantoprazole  40 mg Oral Daily  . prednisoLONE acetate  1 drop Left Eye Daily  . rOPINIRole  1 mg Oral QHS  . zinc sulfate  220 mg Oral Daily  . zolpidem  5 mg Oral QHS   Infusions:  . azithromycin 500 mg (11/25/19 1819)  . cefTRIAXone (ROCEPHIN)  IV 1 g (11/25/19 1735)   PRN: acetaminophen, chlorpheniramine-HYDROcodone, morphine injection, [DISCONTINUED] ondansetron **OR** ondansetron (ZOFRAN) IV, polyethylene glycol, promethazine Anti-infectives (From admission, onward)   Start     Dose/Rate Route Frequency Ordered Stop   11/25/19 1800  cefTRIAXone (ROCEPHIN) 1 g in sodium chloride 0.9 % 100 mL IVPB        1 g 200 mL/hr over 30 Minutes Intravenous Every  24 hours 11/24/19 2214     11/25/19 1800  azithromycin (ZITHROMAX) 500 mg in sodium chloride 0.9 % 250 mL IVPB        500 mg 250 mL/hr over 60 Minutes Intravenous Every 24 hours 11/24/19 2214     11/24/19 1815  cefTRIAXone (ROCEPHIN) 1 g in sodium chloride 0.9 % 100 mL IVPB        1  g 200 mL/hr over 30 Minutes Intravenous  Once 11/24/19 1810 11/24/19 1858   11/24/19 1815  azithromycin (ZITHROMAX) tablet 500 mg        500 mg Oral  Once 11/24/19 1810 11/24/19 1827      Goal of Therapy:  INR 2.5-3.5 Monitor platelets by anticoagulation protocol: Yes    Prior to Admission Warfarin Dosing:  [name] takes 7.5mg  of warfarin on T, TH Sat, and 5mg  all other days per anticoag visit 11/05/2019, patient currently taking 5mg  daily.      Admit INR was 5.1 then 6.7 following day. Lab Results  Component Value Date   INR 4.1 (HH) 11/26/2019   INR 6.7 (HH) 11/25/2019   INR 5.1 (HH) 11/24/2019    Assessment: [name] a 49 y.o. female requires anticoagulation with warfarin for the indication of mitral mechanical valve . Warfarin will be initiated inpatient following pharmacy protocol per pharmacy consult. Patient most recent blood work is as follows: CBC Latest Ref Rng & Units 11/25/2019 11/24/2019 11/11/2019  WBC 4.0 - 10.5 K/uL 4.3 6.3 6.7  Hemoglobin 12.0 - 15.0 g/dL 10.9(L) 12.5 10.7(L)  Hematocrit 36 - 46 % 33.3(L) 37.3 31.8(L)  Platelets 150 - 400 K/uL 303 324 183   INR still supratherapeutic at 4.1.   Plan: Hold warfarin tonight Monitor CBC daily with am labs   Monitor INR daily Monitor for signs and symptoms of bleeding   Isac Sarna, BS [place]ena [place], BCPS Clinical Pharmacist Pager [phone]

## 2019-11-27 ENCOUNTER — Inpatient Hospital Stay (HOSPITAL_COMMUNITY)
Admission: EM | Admit: 2019-11-27 | Discharge: 2019-12-21 | DRG: 871 | Disposition: E | Payer: BC Managed Care – PPO | Attending: Internal Medicine | Admitting: Internal Medicine

## 2019-11-27 ENCOUNTER — Encounter (HOSPITAL_COMMUNITY): Payer: Self-pay | Admitting: *Deleted

## 2019-11-27 ENCOUNTER — Emergency Department (HOSPITAL_COMMUNITY): Payer: BC Managed Care – PPO

## 2019-11-27 ENCOUNTER — Ambulatory Visit (INDEPENDENT_AMBULATORY_CARE_PROVIDER_SITE_OTHER): Payer: BC Managed Care – PPO | Admitting: Pharmacist

## 2019-11-27 DIAGNOSIS — E876 Hypokalemia: Secondary | ICD-10-CM | POA: Diagnosis not present

## 2019-11-27 DIAGNOSIS — R110 Nausea: Secondary | ICD-10-CM | POA: Diagnosis not present

## 2019-11-27 DIAGNOSIS — Z952 Presence of prosthetic heart valve: Secondary | ICD-10-CM | POA: Diagnosis not present

## 2019-11-27 DIAGNOSIS — R221 Localized swelling, mass and lump, neck: Secondary | ICD-10-CM | POA: Diagnosis not present

## 2019-11-27 DIAGNOSIS — R001 Bradycardia, unspecified: Secondary | ICD-10-CM | POA: Diagnosis not present

## 2019-11-27 DIAGNOSIS — E875 Hyperkalemia: Secondary | ICD-10-CM | POA: Diagnosis not present

## 2019-11-27 DIAGNOSIS — J982 Interstitial emphysema: Secondary | ICD-10-CM

## 2019-11-27 DIAGNOSIS — Z83438 Family history of other disorder of lipoprotein metabolism and other lipidemia: Secondary | ICD-10-CM

## 2019-11-27 DIAGNOSIS — R0603 Acute respiratory distress: Secondary | ICD-10-CM

## 2019-11-27 DIAGNOSIS — Z7952 Long term (current) use of systemic steroids: Secondary | ICD-10-CM

## 2019-11-27 DIAGNOSIS — Z9882 Breast implant status: Secondary | ICD-10-CM | POA: Diagnosis not present

## 2019-11-27 DIAGNOSIS — Z0184 Encounter for antibody response examination: Secondary | ICD-10-CM

## 2019-11-27 DIAGNOSIS — Z66 Do not resuscitate: Secondary | ICD-10-CM | POA: Diagnosis not present

## 2019-11-27 DIAGNOSIS — Z5181 Encounter for therapeutic drug level monitoring: Secondary | ICD-10-CM

## 2019-11-27 DIAGNOSIS — M069 Rheumatoid arthritis, unspecified: Secondary | ICD-10-CM | POA: Diagnosis present

## 2019-11-27 DIAGNOSIS — Z87891 Personal history of nicotine dependence: Secondary | ICD-10-CM

## 2019-11-27 DIAGNOSIS — N390 Urinary tract infection, site not specified: Secondary | ICD-10-CM | POA: Diagnosis not present

## 2019-11-27 DIAGNOSIS — J1282 Pneumonia due to coronavirus disease 2019: Secondary | ICD-10-CM | POA: Diagnosis present

## 2019-11-27 DIAGNOSIS — R0902 Hypoxemia: Secondary | ICD-10-CM

## 2019-11-27 DIAGNOSIS — J9601 Acute respiratory failure with hypoxia: Secondary | ICD-10-CM

## 2019-11-27 DIAGNOSIS — Y92239 Unspecified place in hospital as the place of occurrence of the external cause: Secondary | ICD-10-CM | POA: Diagnosis not present

## 2019-11-27 DIAGNOSIS — E8809 Other disorders of plasma-protein metabolism, not elsewhere classified: Secondary | ICD-10-CM

## 2019-11-27 DIAGNOSIS — R040 Epistaxis: Secondary | ICD-10-CM | POA: Diagnosis not present

## 2019-11-27 DIAGNOSIS — Z7901 Long term (current) use of anticoagulants: Secondary | ICD-10-CM

## 2019-11-27 DIAGNOSIS — I471 Supraventricular tachycardia, unspecified: Secondary | ICD-10-CM

## 2019-11-27 DIAGNOSIS — Z4659 Encounter for fitting and adjustment of other gastrointestinal appliance and device: Secondary | ICD-10-CM

## 2019-11-27 DIAGNOSIS — K7200 Acute and subacute hepatic failure without coma: Secondary | ICD-10-CM | POA: Diagnosis not present

## 2019-11-27 DIAGNOSIS — T380X5A Adverse effect of glucocorticoids and synthetic analogues, initial encounter: Secondary | ICD-10-CM | POA: Diagnosis not present

## 2019-11-27 DIAGNOSIS — N179 Acute kidney failure, unspecified: Secondary | ICD-10-CM | POA: Diagnosis not present

## 2019-11-27 DIAGNOSIS — R042 Hemoptysis: Secondary | ICD-10-CM

## 2019-11-27 DIAGNOSIS — J189 Pneumonia, unspecified organism: Secondary | ICD-10-CM

## 2019-11-27 DIAGNOSIS — J811 Chronic pulmonary edema: Secondary | ICD-10-CM | POA: Diagnosis not present

## 2019-11-27 DIAGNOSIS — M797 Fibromyalgia: Secondary | ICD-10-CM | POA: Diagnosis present

## 2019-11-27 DIAGNOSIS — D6489 Other specified anemias: Secondary | ICD-10-CM | POA: Diagnosis present

## 2019-11-27 DIAGNOSIS — E872 Acidosis: Secondary | ICD-10-CM | POA: Diagnosis present

## 2019-11-27 DIAGNOSIS — R739 Hyperglycemia, unspecified: Secondary | ICD-10-CM | POA: Diagnosis not present

## 2019-11-27 DIAGNOSIS — Z79890 Hormone replacement therapy (postmenopausal): Secondary | ICD-10-CM

## 2019-11-27 DIAGNOSIS — M329 Systemic lupus erythematosus, unspecified: Secondary | ICD-10-CM | POA: Diagnosis present

## 2019-11-27 DIAGNOSIS — R918 Other nonspecific abnormal finding of lung field: Secondary | ICD-10-CM | POA: Diagnosis not present

## 2019-11-27 DIAGNOSIS — K3184 Gastroparesis: Secondary | ICD-10-CM | POA: Diagnosis not present

## 2019-11-27 DIAGNOSIS — J90 Pleural effusion, not elsewhere classified: Secondary | ICD-10-CM | POA: Diagnosis not present

## 2019-11-27 DIAGNOSIS — R509 Fever, unspecified: Secondary | ICD-10-CM

## 2019-11-27 DIAGNOSIS — R9431 Abnormal electrocardiogram [ECG] [EKG]: Secondary | ICD-10-CM | POA: Diagnosis not present

## 2019-11-27 DIAGNOSIS — N281 Cyst of kidney, acquired: Secondary | ICD-10-CM | POA: Diagnosis not present

## 2019-11-27 DIAGNOSIS — F419 Anxiety disorder, unspecified: Secondary | ICD-10-CM | POA: Diagnosis present

## 2019-11-27 DIAGNOSIS — J939 Pneumothorax, unspecified: Secondary | ICD-10-CM | POA: Diagnosis not present

## 2019-11-27 DIAGNOSIS — A4189 Other specified sepsis: Secondary | ICD-10-CM | POA: Diagnosis not present

## 2019-11-27 DIAGNOSIS — D589 Hereditary hemolytic anemia, unspecified: Secondary | ICD-10-CM | POA: Diagnosis present

## 2019-11-27 DIAGNOSIS — G8929 Other chronic pain: Secondary | ICD-10-CM | POA: Diagnosis present

## 2019-11-27 DIAGNOSIS — R7401 Elevation of levels of liver transaminase levels: Secondary | ICD-10-CM | POA: Diagnosis not present

## 2019-11-27 DIAGNOSIS — J168 Pneumonia due to other specified infectious organisms: Secondary | ICD-10-CM | POA: Diagnosis not present

## 2019-11-27 DIAGNOSIS — J80 Acute respiratory distress syndrome: Secondary | ICD-10-CM | POA: Diagnosis not present

## 2019-11-27 DIAGNOSIS — E874 Mixed disorder of acid-base balance: Secondary | ICD-10-CM | POA: Diagnosis not present

## 2019-11-27 DIAGNOSIS — D65 Disseminated intravascular coagulation [defibrination syndrome]: Secondary | ICD-10-CM

## 2019-11-27 DIAGNOSIS — E039 Hypothyroidism, unspecified: Secondary | ICD-10-CM | POA: Diagnosis present

## 2019-11-27 DIAGNOSIS — I10 Essential (primary) hypertension: Secondary | ICD-10-CM | POA: Diagnosis present

## 2019-11-27 DIAGNOSIS — Z947 Corneal transplant status: Secondary | ICD-10-CM

## 2019-11-27 DIAGNOSIS — J069 Acute upper respiratory infection, unspecified: Secondary | ICD-10-CM | POA: Diagnosis not present

## 2019-11-27 DIAGNOSIS — I700 Atherosclerosis of aorta: Secondary | ICD-10-CM | POA: Diagnosis not present

## 2019-11-27 DIAGNOSIS — Z888 Allergy status to other drugs, medicaments and biological substances status: Secondary | ICD-10-CM

## 2019-11-27 DIAGNOSIS — E538 Deficiency of other specified B group vitamins: Secondary | ICD-10-CM | POA: Diagnosis present

## 2019-11-27 DIAGNOSIS — E43 Unspecified severe protein-calorie malnutrition: Secondary | ICD-10-CM | POA: Insufficient documentation

## 2019-11-27 DIAGNOSIS — U071 COVID-19: Secondary | ICD-10-CM

## 2019-11-27 DIAGNOSIS — K219 Gastro-esophageal reflux disease without esophagitis: Secondary | ICD-10-CM | POA: Diagnosis present

## 2019-11-27 DIAGNOSIS — Z8249 Family history of ischemic heart disease and other diseases of the circulatory system: Secondary | ICD-10-CM

## 2019-11-27 DIAGNOSIS — Z6828 Body mass index (BMI) 28.0-28.9, adult: Secondary | ICD-10-CM

## 2019-11-27 DIAGNOSIS — Z9012 Acquired absence of left breast and nipple: Secondary | ICD-10-CM

## 2019-11-27 DIAGNOSIS — I469 Cardiac arrest, cause unspecified: Secondary | ICD-10-CM

## 2019-11-27 DIAGNOSIS — B49 Unspecified mycosis: Secondary | ICD-10-CM | POA: Diagnosis present

## 2019-11-27 DIAGNOSIS — Z452 Encounter for adjustment and management of vascular access device: Secondary | ICD-10-CM | POA: Diagnosis not present

## 2019-11-27 DIAGNOSIS — M328 Other forms of systemic lupus erythematosus: Secondary | ICD-10-CM | POA: Diagnosis not present

## 2019-11-27 DIAGNOSIS — K8689 Other specified diseases of pancreas: Secondary | ICD-10-CM | POA: Diagnosis not present

## 2019-11-27 DIAGNOSIS — I472 Ventricular tachycardia: Secondary | ICD-10-CM | POA: Diagnosis present

## 2019-11-27 DIAGNOSIS — R079 Chest pain, unspecified: Secondary | ICD-10-CM | POA: Diagnosis not present

## 2019-11-27 DIAGNOSIS — D849 Immunodeficiency, unspecified: Secondary | ICD-10-CM | POA: Diagnosis present

## 2019-11-27 DIAGNOSIS — R0602 Shortness of breath: Secondary | ICD-10-CM | POA: Diagnosis not present

## 2019-11-27 DIAGNOSIS — G629 Polyneuropathy, unspecified: Secondary | ICD-10-CM | POA: Diagnosis present

## 2019-11-27 DIAGNOSIS — T797XXA Traumatic subcutaneous emphysema, initial encounter: Secondary | ICD-10-CM

## 2019-11-27 DIAGNOSIS — I361 Nonrheumatic tricuspid (valve) insufficiency: Secondary | ICD-10-CM | POA: Diagnosis not present

## 2019-11-27 DIAGNOSIS — J439 Emphysema, unspecified: Secondary | ICD-10-CM | POA: Diagnosis not present

## 2019-11-27 DIAGNOSIS — Z01818 Encounter for other preprocedural examination: Secondary | ICD-10-CM

## 2019-11-27 DIAGNOSIS — Z79899 Other long term (current) drug therapy: Secondary | ICD-10-CM

## 2019-11-27 DIAGNOSIS — R0489 Hemorrhage from other sites in respiratory passages: Secondary | ICD-10-CM

## 2019-11-27 DIAGNOSIS — Z4682 Encounter for fitting and adjustment of non-vascular catheter: Secondary | ICD-10-CM | POA: Diagnosis not present

## 2019-11-27 DIAGNOSIS — R0789 Other chest pain: Secondary | ICD-10-CM | POA: Diagnosis not present

## 2019-11-27 DIAGNOSIS — R6521 Severe sepsis with septic shock: Secondary | ICD-10-CM | POA: Diagnosis not present

## 2019-11-27 DIAGNOSIS — Z853 Personal history of malignant neoplasm of breast: Secondary | ICD-10-CM

## 2019-11-27 DIAGNOSIS — R000 Tachycardia, unspecified: Secondary | ICD-10-CM

## 2019-11-27 LAB — BLOOD GAS, VENOUS
Acid-Base Excess: 8 mmol/L — ABNORMAL HIGH (ref 0.0–2.0)
Bicarbonate: 29.2 mmol/L — ABNORMAL HIGH (ref 20.0–28.0)
FIO2: 100
O2 Saturation: 16.4 %
Patient temperature: 37.2
pCO2, Ven: 54.2 mmHg (ref 44.0–60.0)
pH, Ven: 7.401 (ref 7.250–7.430)
pO2, Ven: <31 mmHg — CL (ref 32.0–45.0)

## 2019-11-27 MED ORDER — SODIUM CHLORIDE 0.9 % IV BOLUS
500.0000 mL | Freq: Once | INTRAVENOUS | Status: AC
  Administered 2019-11-27: 500 mL via INTRAVENOUS

## 2019-11-27 NOTE — Patient Instructions (Signed)
Description   Patient's last INR at discharge 4.1.  Took 5 mg last night.  Is on prednisone taper.  Advised patient to take 2.5 mg Fri/Sat/Sun and recheck Inr on Monday

## 2019-11-27 NOTE — ED Provider Notes (Signed)
[facility] EMERGENCY DEPARTMENT Provider Note   CSN: [id number] Arrival date & time: 12/20/2019  2223     History Chief Complaint  Patient presents with  . Shortness of Breath    [name] is a 49 y.o. female.  HPI She is here for evaluation of shortness of breath which started today.  She is on home oxygen, but it was not helping.  Patient was discharged from hospital yesterday after being treated for Covid infection.  She was discharged on prednisone taper.  She is not currently a smoker but has smoked in the past.  Covid symptoms began, on 11/09/2019.  She has been hospitalized twice, during this illness respiratory distress.  Patient remains lucid and able to contribute to history.  She denies chest pain, nausea or vomiting.  She is taking her usual medications.  There are no other modifying factors.    Past Medical History:  Diagnosis Date  . Abnormal Papanicolaou smear of cervix with positive human papilloma virus (HPV) test 08/17/2015  . Anticoagulation goal of INR 2.5 to 3.5   . Anxiety   . B12 deficiency   . BRCA1 negative   . BRCA2 negative   . Breast cancer, left breast ([place]) 2011   S/P mastectomy; chemo; "no radiation due to lupus"  . Bursitis of right knee    Septic bursitis  . Chronic lower back pain   . Drug-induced hepatitis    States per her rheumatologist, transaminases elevated but normalized after drug removed for   . Fibromyalgia   . GERD (gastroesophageal reflux disease)   . 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 History of abnormal cervical Pap smear 08/12/2015  . History of blood transfusion   . HSIL (high grade squamous intraepithelial lesion) on Pap smear of cervix 01/14/2017   colpo with Dr Elonda Husky   . Hypothyroidism   . Mitral valve disease, rheumatic    St. Jude prosthesis  . Peripheral neuropathy   . Pneumonia   . Raynaud phenomenon   . Rheumatoid arthritis ([place])   . SLE (systemic lupus erythematosus) ([place])      Patient Active Problem List   Diagnosis Date Noted  . Acute respiratory failure with hypoxia ([place]) 11/24/2019  . Immunosuppression ([place])   . Pneumonia due to COVID-19 virus 11/09/2019  . Hypokalemia 11/09/2019  . HSIL (high grade squamous intraepithelial lesion) on Pap smear of cervix 01/14/2017  . Encounter for gynecological examination with Papanicolaou smear of cervix 01/08/2017  . At risk for hemorrhage associated with liver biopsy 11/28/2016  . Elevated LFTs 10/04/2016  . Appendicitis with peritoneal abscess 05/23/2016  . Sepsis, unspecified organism ([place]) 05/22/2016  . AKI (acute kidney injury) ([place]) 05/22/2016  . Abdominal pain 05/21/2016  . Appendicitis with abscess 05/21/2016  . Volume depletion 05/21/2016  . History of breast cancer 05/21/2016  . Vaginal discharge 11/14/2015  . Vaginal burning 11/14/2015  . BV (bacterial vaginosis) 11/14/2015  . Abnormal Papanicolaou smear of cervix with positive human papilloma virus (HPV) test 08/17/2015  . History of abnormal cervical Pap smear 08/12/2015  . Primary hypothyroidism 12/07/2014  . Primary hypercoagulable state ([place]) 12/14/2013  . Encounter for monitoring cardiotoxic drug therapy 11/23/2013  . Gastroparesis 08/13/2013  . Screening for colorectal cancer 03/18/2013  . Dyspepsia 03/12/2013  . Dyspnea 11/14/2012  . Anticoagulation goal of INR 2.5 to 3.5   . Peripheral neuropathy   . B12 deficiency 08/21/2010  . 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  BRCA1 negative 08/16/2010  . BRCA2 negative 08/16/2010  . Chronic anticoagulation 05/17/2010  . Malignant neoplasm of upper outer quadrant of female breast ([place]) 02/24/2010  . History of mitral valve replacement with mechanical valve 11/10/2009  . Systemic lupus erythematosus ([place]) 11/10/2009    Past Surgical History:  Procedure Laterality Date  . APPENDECTOMY N/A 05/23/2016   Procedure: OPEN APPENDECTOMY WITH DRAINAGE OF  ABSCESS;  Surgeon: Fanny Skates, MD;  Location: [place];  Service: General;  Laterality: N/A;  . APPENDECTOMY    . BREAST BIOPSY Left 2012  . BREAST IMPLANT EXCHANGE Left 12/2015  . CARDIAC CATHETERIZATION  2008  . CARDIAC VALVE REPLACEMENT    . CERVICAL CONIZATION W/BX N/A 03/06/2017   Procedure: Laser Conization of Cervix;  Surgeon: Florian Buff, MD;  Location: [facility];  Service: Gynecology;  Laterality: N/A;  . CORNEAL TRANSPLANT Left   . ENDOMETRIAL ABLATION  2008   She no longer has menses  . ESOPHAGOGASTRODUODENOSCOPY N/A 03/25/2013   Dr. Raliegh Scarlet reflux esophagitis-likely source of patient's symptoms (patulous EG junction). Hiatal hernia otherwise normal  . LAPAROSCOPIC CHOLECYSTECTOMY    . LIVER BIOPSY  11/28/2016   liver core/notes 11/28/2016  . MASTECTOMY Left 2012  . MASTOPEXY  02/14/2011   Procedure: MASTOPEXY;  Surgeon: Macon Large;  Location: [place];  Service: Plastics;  Laterality: Bilateral;  Right Breast Reduction   . MITRAL VALVE REPLACEMENT  2008  . TISSUE EXPANDER PLACEMENT  02/14/2011   Procedure: TISSUE EXPANDER;  Surgeon: Macon Large;  Location: [place];  Service: Plastics;  Laterality: Bilateral;  Left Breast Remove Tissue Expander Placement of Implant Breast Reconstruction  . TISSUE EXPANDER PLACEMENT Left 11/16/2010   breasst  . TUBAL LIGATION       OB History    Gravida  2   Para  2   Term  0   Preterm      AB      Living  2     SAB      TAB      Ectopic      Multiple      Live Births  2           Family History  Problem Relation Age of Onset  . Hypertension Mother   . Hyperlipidemia Mother   . Hypertension Father   . Colon cancer Neg Hx   . Colon polyps Neg Hx     Social History   Tobacco Use  . Smoking status: Former Smoker    Packs/day: 1.00    Years: 15.00    Pack years: 15.00    Types: Cigarettes    Quit date: 02/19/2006    Years since quitting: 13.7  . Smokeless tobacco: Never Used  Vaping Use  . Vaping  Use: Never used  Substance Use Topics  . Alcohol use: Not Currently    Alcohol/week: 0.0 standard drinks    Comment: 11/28/2016 "couple drinks/yearZ"  . Drug use: No    Home Medications Prior to Admission medications   Medication Sig Start Date End Date Taking? Authorizing Provider  acetaminophen (TYLENOL) 325 MG tablet Take 2 tablets (650 mg total) by mouth every 6 (six) hours as needed for mild pain or headache (fever >/= 101). 11/11/19   Barton Dubois, MD  albuterol (VENTOLIN HFA) 108 (90 Base) MCG/ACT inhaler Inhale 2 puffs into the lungs 3 (three) times daily. 11/26/19   Roxan Hockey, MD  ascorbic acid (VITAMIN C) 500 MG tablet Take 1 tablet (  500 mg total) by mouth daily. 12/02/2019   Roxan Hockey, MD  azithromycin (ZITHROMAX) 500 MG tablet Take 1 tablet (500 mg total) by mouth daily for 5 days. 11/26/19 12/01/19  Roxan Hockey, MD  bromfenac (XIBROM) 0.09 % ophthalmic solution Place 1 drop into the left eye.  06/23/19   [provider]  buPROPion (WELLBUTRIN XL) 300 MG 24 hr tablet Take 300 mg by mouth daily. Patient not taking: Reported on 11/24/2019 10/21/19   [provider]  chlorpheniramine-HYDROcodone (TUSSIONEX PENNKINETIC ER) 10-8 MG/5ML SUER Take 5 mLs by mouth every 12 (twelve) hours as needed for cough. 11/26/19   Roxan Hockey, MD  cyanocobalamin (,VITAMIN B-12,) 1000 MCG/ML injection Inject 1 mL (1,000 mcg total) into the muscle every 30 (thirty) days. 12/19/10   Everardo All, MD  cycloSPORINE (RESTASIS) 0.05 % ophthalmic emulsion Place 1 drop into the left eye daily.    [provider]  DEXILANT 60 MG capsule TAKE ONE CAPSULE BY MOUTH DAILY. Patient taking differently: Take 60 mg by mouth daily.  09/25/19   Mahala Menghini, PA-C  furosemide (LASIX) 40 MG tablet Take 0.5 tablets (20 mg total) by mouth daily. 11/11/19   Barton Dubois, MD  levothyroxine (SYNTHROID) 75 MCG tablet Take 1 tablet (75 mcg total) by mouth daily before breakfast.  07/15/19   Nida, Marella Chimes, MD  metoCLOPramide (REGLAN) 5 MG tablet TAKE (1) TABLET BY MOUTH TWICE DAILY. Patient taking differently: Take 5 mg by mouth in the morning and at bedtime.  02/03/19   Annitta Needs, NP  nepafenac (NEVANAC) 0.1 % ophthalmic suspension Place 1 drop into the left eye daily.  06/19/19   [provider]  ondansetron (ZOFRAN) 4 MG tablet Take 1 tablet (4 mg total) by mouth every 8 (eight) hours as needed for nausea or vomiting. 10/09/17   Annitta Needs, NP  polyethylene glycol (MIRALAX / GLYCOLAX) 17 g packet Take 17 g by mouth daily as needed for mild constipation. 11/26/19   Roxan Hockey, MD  potassium chloride (KLOR-CON 10) 10 MEQ tablet Take 10 mEq by mouth every evening. Take one tablet daily as directed 11/09/13   [provider]  prednisoLONE acetate (PRED FORTE) 1 % ophthalmic suspension Place 1 drop into the left eye daily.  09/28/19   [provider]  predniSONE (DELTASONE) 10 MG tablet Take 1 tablet (10 mg total) by mouth See admin instructions. Take 40 mg (4tab) daily for 4 days, then 30 mg daily for 4 days, then 20 mg (2 tab) daily for 4 days , then 10 mg (1 Tab) daily for 4 days, then STOP 11/26/19   Roxan Hockey, MD  promethazine (PHENERGAN) 12.5 MG tablet Take 1 tablet (12.5 mg total) by mouth every 6 (six) hours as needed for nausea or vomiting. 10/09/17   Annitta Needs, NP  riTUXimab (RITUXAN) 100 MG/10ML injection Inject 10 mLs (100 mg total) into the vein See admin instructions. Every 4 months; please hold next injection until discussion with rheumatology service 11/11/19   Barton Dubois, MD  rOPINIRole (REQUIP) 1 MG tablet Take 1 mg by mouth at bedtime.    [provider]  warfarin (COUMADIN) 5 MG tablet Take 1 tablet (5 mg total) by mouth daily. 11/26/19   Roxan Hockey, MD  zinc sulfate 220 (50 Zn) MG capsule Take 1 capsule (220 mg total) by mouth daily. 12/19/2019   Roxan Hockey, MD  zolpidem (AMBIEN) 10 MG  tablet Take 10 mg by mouth at bedtime.  12/19/10   de Stanford Scotland, MD    Allergies    Magnesium-containing compounds, Methotrexate derivatives, and Other  Review of Systems   Review of Systems  All other systems reviewed and are negative.   Physical Exam Updated Vital Signs BP (!) 90/54 (BP Location: Right Arm)   Pulse 99   Temp 98.9 F (37.2 C) (Oral)   Resp (!) 31   SpO2 97%   Physical Exam Vitals and nursing note reviewed.  Constitutional:      General: She is not in acute distress.    Appearance: She is well-developed. She is ill-appearing. She is not toxic-appearing or diaphoretic.  HENT:     Head: Normocephalic and atraumatic.     Right Ear: External ear normal.     Left Ear: External ear normal.  Eyes:     Conjunctiva/sclera: Conjunctivae normal.     Pupils: Pupils are equal, round, and reactive to light.  Neck:     Trachea: Phonation normal.  Cardiovascular:     Rate and Rhythm: Normal rate and regular rhythm.     Heart sounds: Normal heart sounds.  Pulmonary:     Effort: Pulmonary effort is normal. No respiratory distress.     Breath sounds: No stridor.     Comments: Somewhat decreased air movement bilaterally, no audible wheezes rales or rhonchi.  There is no increased work of breathing.  She is tachypneic. Abdominal:     General: There is no distension.     Palpations: Abdomen is soft.     Tenderness: There is no abdominal tenderness.  Musculoskeletal:        General: Normal range of motion.     Cervical back: Normal range of motion and neck supple.  Skin:    General: Skin is warm and dry.  Neurological:     Mental Status: She is alert and oriented to person, place, and time.     Cranial Nerves: No cranial nerve deficit.     Sensory: No sensory deficit.     Motor: No abnormal muscle tone.     Coordination: Coordination normal.  Psychiatric:        Mood and Affect: Mood normal.        Behavior: Behavior normal.        Thought Content: Thought  content normal.        Judgment: Judgment normal.     ED Results / Procedures / Treatments   Labs (all labs ordered are listed, but only abnormal results are displayed) Labs Reviewed  CBC WITH DIFFERENTIAL/PLATELET  COMPREHENSIVE METABOLIC PANEL  URINALYSIS, ROUTINE W REFLEX MICROSCOPIC  BLOOD GAS, VENOUS  PROTIME-INR    EKG EKG Interpretation  Date/Time:  Friday November 27 2019 22:36:09 EDT Ventricular Rate:  99 PR Interval:    QRS Duration: 81 QT Interval:  315 QTC Calculation: 405 R Axis:   67 Text Interpretation: Sinus rhythm Probable left atrial enlargement Low voltage, precordial leads since last tracing no significant change Confirmed by Daleen Bo [phone]) on 11/25/2019 10:44:54 PM   EKG Interpretation  Date/Time:  Friday November 27 2019 22:39:45 EDT Ventricular Rate:  195 PR Interval:    QRS Duration: 68 QT Interval:  224 QTC Calculation: 404 R Axis:   77 Text Interpretation: Supraventricular tachycardia Ventricular premature complex Low voltage, precordial leads Repolarization abnormality, prob rate related Baseline wander in lead(s) V6 Since last tracing of earlier today short period of SVT Otherwise no significant change Confirmed by Daleen Bo [phone]) on 12/16/2019  11:04:28 PM         Radiology DG Chest Port 1 View  Result Date: 11/22/2019 CLINICAL DATA:  Shortness of breath with hypoxia, history of COVID-19 positivity EXAM: PORTABLE CHEST 1 VIEW COMPARISON:  11/24/2019 FINDINGS: Cardiac shadow is stable. Bilateral airspace opacities are again identified and stable. Small right-sided pleural effusion is noted which is new from the prior study. No bony abnormality is seen. IMPRESSION: Stable airspace opacities consistent with the given clinical history. Small right-sided pleural effusion. Electronically Signed   By: Inez Catalina M.D.   On: 12/04/2019 23:13    Procedures Procedures (including critical care time)  Medications Ordered in  ED Medications  sodium chloride 0.9 % bolus 500 mL (500 mLs Intravenous New Bag/Given 11/24/2019 2340)    ED Course  I have reviewed the triage vital signs and the nursing notes.  Pertinent labs & imaging results that were available during my care of the patient were reviewed by me and considered in my medical decision making (see chart for details).    MDM Rules/Calculators/A&P                           Patient Vitals for the past 24 hrs:  BP Temp Temp src Pulse Resp SpO2  12/12/2019 2231 (!) 90/54 98.9 F (37.2 C) Oral 99 (!) 31 97 %     Medical Decision Making:  This patient is presenting for evaluation of shortness of breath with recent Covid infection, which does require a range of treatment options, and is a complaint that involves a high risk of morbidity and mortality. The differential diagnoses include worsening pneumonia, myocarditis, metabolic disorder, volume depletion. I decided to review old records, and in summary patient with recent Covid infection, presenting with worsening symptoms, including hypoxia and palpitations..  I obtained additional historical information from her daughter at bedside.  Clinical Laboratory Tests Ordered, included CBC, Metabolic panel and Blood gas. Radiologic Tests Ordered, included chest x-ray.  I independently Visualized: Radiographic images, which show stable changes consistent with COVID-19 positive test   Cardiac Monitor Tracing which shows sinus rhythm with periods of SVT, rate around 200.    Critical Interventions-clinical observation, laboratory testing, EKG, cardiac monitor, IV fluids, observation   [name] was evaluated in Emergency Department on 12/19/2019 for the symptoms described in the history of present illness. She was evaluated in the context of the global COVID-19 pandemic, which necessitated consideration that the patient might be at risk for infection with the SARS-CoV-2 virus that causes COVID-19.  Institutional protocols and algorithms that pertain to the evaluation of patients at risk for COVID-19 are in a state of rapid change based on information released by regulatory bodies including the CDC and federal and state organizations. These policies and algorithms were followed during the patient's care in the ED.   After These Interventions, the Patient was reevaluated and was found with periods of SVT, and hypoxia, requiring further treatment and assessment.  CT imaging ordered.  Patient will likely need to be admitted and get cardiac echo with close monitoring for concern of cardiomyopathy.   CRITICAL CARE-no Performed by: Daleen Bo  Nursing Notes Reviewed/ Care Coordinated Applicable Imaging Reviewed Interpretation of Laboratory Data incorporated into ED treatment  Plan as per oncoming treatment team, Dr. Sedonia Small will arrange admission    Final Clinical Impression(s) / ED Diagnoses Final diagnoses:  COVID-19 virus infection  Hypoxia  SVT (supraventricular tachycardia) ([place])    Rx /  DC Orders ED Discharge Orders    None       Daleen Bo, MD 12/04/2019 2349

## 2019-11-27 NOTE — ED Triage Notes (Signed)
Pt's states that sob continued and sats dropped in the 80's despite having O2 on.  Daughter is a Marine scientist and has been monitoring at home.  Pt states she became dizzy earlier

## 2019-11-28 ENCOUNTER — Inpatient Hospital Stay (HOSPITAL_COMMUNITY): Payer: BC Managed Care – PPO

## 2019-11-28 ENCOUNTER — Emergency Department (HOSPITAL_COMMUNITY): Payer: BC Managed Care – PPO

## 2019-11-28 ENCOUNTER — Inpatient Hospital Stay: Payer: Self-pay

## 2019-11-28 DIAGNOSIS — J168 Pneumonia due to other specified infectious organisms: Secondary | ICD-10-CM | POA: Diagnosis not present

## 2019-11-28 DIAGNOSIS — J069 Acute upper respiratory infection, unspecified: Secondary | ICD-10-CM | POA: Diagnosis present

## 2019-11-28 DIAGNOSIS — E039 Hypothyroidism, unspecified: Secondary | ICD-10-CM

## 2019-11-28 DIAGNOSIS — D849 Immunodeficiency, unspecified: Secondary | ICD-10-CM | POA: Diagnosis present

## 2019-11-28 DIAGNOSIS — R9431 Abnormal electrocardiogram [ECG] [EKG]: Secondary | ICD-10-CM | POA: Diagnosis not present

## 2019-11-28 DIAGNOSIS — Z66 Do not resuscitate: Secondary | ICD-10-CM | POA: Diagnosis not present

## 2019-11-28 DIAGNOSIS — U071 COVID-19: Secondary | ICD-10-CM | POA: Diagnosis present

## 2019-11-28 DIAGNOSIS — R001 Bradycardia, unspecified: Secondary | ICD-10-CM | POA: Diagnosis not present

## 2019-11-28 DIAGNOSIS — E8809 Other disorders of plasma-protein metabolism, not elsewhere classified: Secondary | ICD-10-CM

## 2019-11-28 DIAGNOSIS — J80 Acute respiratory distress syndrome: Secondary | ICD-10-CM | POA: Diagnosis not present

## 2019-11-28 DIAGNOSIS — E876 Hypokalemia: Secondary | ICD-10-CM

## 2019-11-28 DIAGNOSIS — E874 Mixed disorder of acid-base balance: Secondary | ICD-10-CM | POA: Diagnosis not present

## 2019-11-28 DIAGNOSIS — B49 Unspecified mycosis: Secondary | ICD-10-CM | POA: Diagnosis present

## 2019-11-28 DIAGNOSIS — N179 Acute kidney failure, unspecified: Secondary | ICD-10-CM | POA: Diagnosis not present

## 2019-11-28 DIAGNOSIS — J9601 Acute respiratory failure with hypoxia: Secondary | ICD-10-CM | POA: Diagnosis not present

## 2019-11-28 DIAGNOSIS — D65 Disseminated intravascular coagulation [defibrination syndrome]: Secondary | ICD-10-CM | POA: Diagnosis present

## 2019-11-28 DIAGNOSIS — I361 Nonrheumatic tricuspid (valve) insufficiency: Secondary | ICD-10-CM | POA: Diagnosis not present

## 2019-11-28 DIAGNOSIS — E43 Unspecified severe protein-calorie malnutrition: Secondary | ICD-10-CM | POA: Diagnosis present

## 2019-11-28 DIAGNOSIS — K3184 Gastroparesis: Secondary | ICD-10-CM | POA: Diagnosis not present

## 2019-11-28 DIAGNOSIS — Y92239 Unspecified place in hospital as the place of occurrence of the external cause: Secondary | ICD-10-CM | POA: Diagnosis not present

## 2019-11-28 DIAGNOSIS — R7401 Elevation of levels of liver transaminase levels: Secondary | ICD-10-CM

## 2019-11-28 DIAGNOSIS — D589 Hereditary hemolytic anemia, unspecified: Secondary | ICD-10-CM | POA: Diagnosis present

## 2019-11-28 DIAGNOSIS — K7200 Acute and subacute hepatic failure without coma: Secondary | ICD-10-CM | POA: Diagnosis not present

## 2019-11-28 DIAGNOSIS — Z952 Presence of prosthetic heart valve: Secondary | ICD-10-CM | POA: Diagnosis not present

## 2019-11-28 DIAGNOSIS — J1282 Pneumonia due to coronavirus disease 2019: Secondary | ICD-10-CM | POA: Diagnosis present

## 2019-11-28 DIAGNOSIS — J939 Pneumothorax, unspecified: Secondary | ICD-10-CM | POA: Diagnosis not present

## 2019-11-28 DIAGNOSIS — I472 Ventricular tachycardia: Secondary | ICD-10-CM | POA: Diagnosis present

## 2019-11-28 DIAGNOSIS — A4189 Other specified sepsis: Secondary | ICD-10-CM | POA: Diagnosis present

## 2019-11-28 DIAGNOSIS — J189 Pneumonia, unspecified organism: Secondary | ICD-10-CM | POA: Diagnosis not present

## 2019-11-28 DIAGNOSIS — Z5181 Encounter for therapeutic drug level monitoring: Secondary | ICD-10-CM | POA: Diagnosis not present

## 2019-11-28 DIAGNOSIS — I469 Cardiac arrest, cause unspecified: Secondary | ICD-10-CM | POA: Diagnosis not present

## 2019-11-28 DIAGNOSIS — I471 Supraventricular tachycardia: Secondary | ICD-10-CM | POA: Diagnosis present

## 2019-11-28 DIAGNOSIS — R221 Localized swelling, mass and lump, neck: Secondary | ICD-10-CM | POA: Diagnosis not present

## 2019-11-28 DIAGNOSIS — N390 Urinary tract infection, site not specified: Secondary | ICD-10-CM | POA: Diagnosis not present

## 2019-11-28 DIAGNOSIS — R0489 Hemorrhage from other sites in respiratory passages: Secondary | ICD-10-CM | POA: Diagnosis not present

## 2019-11-28 DIAGNOSIS — R6521 Severe sepsis with septic shock: Secondary | ICD-10-CM | POA: Diagnosis present

## 2019-11-28 DIAGNOSIS — E872 Acidosis: Secondary | ICD-10-CM | POA: Diagnosis present

## 2019-11-28 LAB — CBC WITH DIFFERENTIAL/PLATELET
Abs Immature Granulocytes: 0.5 10*3/uL — ABNORMAL HIGH (ref 0.00–0.07)
Basophils Absolute: 0 10*3/uL (ref 0.0–0.1)
Basophils Relative: 0 %
Eosinophils Absolute: 0.1 10*3/uL (ref 0.0–0.5)
Eosinophils Relative: 1 %
HCT: 35.4 % — ABNORMAL LOW (ref 36.0–46.0)
Hemoglobin: 11.8 g/dL — ABNORMAL LOW (ref 12.0–15.0)
Immature Granulocytes: 6 %
Lymphocytes Relative: 6 %
Lymphs Abs: 0.5 10*3/uL — ABNORMAL LOW (ref 0.7–4.0)
MCH: 30.3 pg (ref 26.0–34.0)
MCHC: 33.3 g/dL (ref 30.0–36.0)
MCV: 91 fL (ref 80.0–100.0)
Monocytes Absolute: 0.2 10*3/uL (ref 0.1–1.0)
Monocytes Relative: 2 %
Neutro Abs: 7.8 10*3/uL — ABNORMAL HIGH (ref 1.7–7.7)
Neutrophils Relative %: 85 %
Platelets: 322 10*3/uL (ref 150–400)
RBC: 3.89 MIL/uL (ref 3.87–5.11)
RDW: 13.7 % (ref 11.5–15.5)
WBC: 9.1 10*3/uL (ref 4.0–10.5)
nRBC: 0 % (ref 0.0–0.2)

## 2019-11-28 LAB — COMPREHENSIVE METABOLIC PANEL
ALT: 71 U/L — ABNORMAL HIGH (ref 0–44)
AST: 58 U/L — ABNORMAL HIGH (ref 15–41)
Albumin: 2.9 g/dL — ABNORMAL LOW (ref 3.5–5.0)
Alkaline Phosphatase: 90 U/L (ref 38–126)
Anion gap: 9 (ref 5–15)
BUN: 15 mg/dL (ref 6–20)
CO2: 30 mmol/L (ref 22–32)
Calcium: 8.4 mg/dL — ABNORMAL LOW (ref 8.9–10.3)
Chloride: 96 mmol/L — ABNORMAL LOW (ref 98–111)
Creatinine, Ser: 0.72 mg/dL (ref 0.44–1.00)
GFR, Estimated: >60 mL/min (ref >60)
Glucose, Bld: 97 mg/dL (ref 70–99)
Potassium: 3.4 mmol/L — ABNORMAL LOW (ref 3.5–5.1)
Sodium: 135 mmol/L (ref 135–145)
Total Bilirubin: 0.9 mg/dL (ref 0.3–1.2)
Total Protein: 6.6 g/dL (ref 6.5–8.1)

## 2019-11-28 LAB — MAGNESIUM: Magnesium: 2 mg/dL (ref 1.7–2.4)

## 2019-11-28 LAB — PROTIME-INR
INR: 2.2 — ABNORMAL HIGH (ref 0.8–1.2)
Prothrombin Time: 23.6 seconds — ABNORMAL HIGH (ref 11.4–15.2)

## 2019-11-28 LAB — PROCALCITONIN: Procalcitonin: 0.34 ng/mL

## 2019-11-28 LAB — TROPONIN I (HIGH SENSITIVITY)
Troponin I (High Sensitivity): 11 ng/L (ref <18)
Troponin I (High Sensitivity): 15 ng/L (ref <18)

## 2019-11-28 LAB — BRAIN NATRIURETIC PEPTIDE: B Natriuretic Peptide: 84 pg/mL (ref 0.0–100.0)

## 2019-11-28 LAB — PHOSPHORUS: Phosphorus: 1.7 mg/dL — ABNORMAL LOW (ref 2.5–4.6)

## 2019-11-28 MED ORDER — PROMETHAZINE HCL 25 MG/ML IJ SOLN
12.5000 mg | Freq: Three times a day (TID) | INTRAMUSCULAR | Status: DC | PRN
  Administered 2019-11-28 – 2019-11-30 (×5): 12.5 mg via INTRAVENOUS
  Filled 2019-11-28 (×5): qty 1

## 2019-11-28 MED ORDER — WARFARIN - PHARMACIST DOSING INPATIENT: Freq: Every day | Status: DC

## 2019-11-28 MED ORDER — SODIUM CHLORIDE 0.9 % IV SOLN
500.0000 mg | INTRAVENOUS | Status: AC
  Administered 2019-11-28 – 2019-12-01 (×4): 500 mg via INTRAVENOUS
  Filled 2019-11-28 (×4): qty 500

## 2019-11-28 MED ORDER — ROPINIROLE HCL 1 MG PO TABS
1.0000 mg | ORAL_TABLET | Freq: Once | ORAL | Status: AC
  Administered 2019-11-29: 1 mg via ORAL
  Filled 2019-11-28 (×2): qty 1

## 2019-11-28 MED ORDER — PANTOPRAZOLE SODIUM 40 MG IV SOLR
40.0000 mg | INTRAVENOUS | Status: DC
  Administered 2019-11-28 – 2019-11-30 (×3): 40 mg via INTRAVENOUS
  Filled 2019-11-28 (×3): qty 40

## 2019-11-28 MED ORDER — SODIUM CHLORIDE 0.9 % IV SOLN
2.0000 g | Freq: Once | INTRAVENOUS | Status: AC
  Administered 2019-11-28: 2 g via INTRAVENOUS
  Filled 2019-11-28: qty 20

## 2019-11-28 MED ORDER — SODIUM CHLORIDE 0.9 % IV SOLN
2.0000 g | INTRAVENOUS | Status: DC
  Administered 2019-11-29 – 2019-11-30 (×2): 2 g via INTRAVENOUS
  Filled 2019-11-28 (×2): qty 20

## 2019-11-28 MED ORDER — IPRATROPIUM-ALBUTEROL 0.5-2.5 (3) MG/3ML IN SOLN: 3.0000 mL | RESPIRATORY_TRACT | Status: DC | PRN

## 2019-11-28 MED ORDER — AZITHROMYCIN 250 MG PO TABS
500.0000 mg | ORAL_TABLET | Freq: Once | ORAL | Status: AC
  Administered 2019-11-28: 500 mg via ORAL
  Filled 2019-11-28: qty 2

## 2019-11-28 MED ORDER — IOHEXOL 350 MG/ML SOLN
100.0000 mL | Freq: Once | INTRAVENOUS | Status: AC | PRN
  Administered 2019-11-28: 100 mL via INTRAVENOUS

## 2019-11-28 MED ORDER — ENOXAPARIN SODIUM 40 MG/0.4ML ~~LOC~~ SOLN
40.0000 mg | SUBCUTANEOUS | Status: DC
  Administered 2019-11-28: 40 mg via SUBCUTANEOUS
  Filled 2019-11-28: qty 0.4

## 2019-11-28 MED ORDER — SODIUM CHLORIDE 0.9 % IV SOLN
500.0000 mg | INTRAVENOUS | Status: DC
  Filled 2019-11-28: qty 500

## 2019-11-28 MED ORDER — DEXAMETHASONE SODIUM PHOSPHATE 10 MG/ML IJ SOLN
6.0000 mg | Freq: Once | INTRAMUSCULAR | Status: AC
  Administered 2019-11-28: 6 mg via INTRAVENOUS
  Filled 2019-11-28: qty 1

## 2019-11-28 MED ORDER — ALPRAZOLAM 0.5 MG PO TABS
0.5000 mg | ORAL_TABLET | Freq: Two times a day (BID) | ORAL | Status: DC | PRN
  Administered 2019-11-28 – 2019-12-05 (×8): 0.5 mg via ORAL
  Filled 2019-11-28 (×8): qty 1

## 2019-11-28 MED ORDER — HYDROCOD POLST-CPM POLST ER 10-8 MG/5ML PO SUER
5.0000 mL | Freq: Two times a day (BID) | ORAL | Status: DC | PRN
  Administered 2019-11-28 – 2019-12-06 (×17): 5 mL via ORAL
  Filled 2019-11-28 (×17): qty 5

## 2019-11-28 MED ORDER — POTASSIUM PHOSPHATES 15 MMOLE/5ML IV SOLN
30.0000 mmol | Freq: Once | INTRAVENOUS | Status: AC
  Administered 2019-11-28: 30 mmol via INTRAVENOUS
  Filled 2019-11-28: qty 10

## 2019-11-28 MED ORDER — ADULT MULTIVITAMIN W/MINERALS CH
1.0000 | ORAL_TABLET | Freq: Every day | ORAL | Status: DC
  Administered 2019-11-29 – 2019-12-07 (×9): 1 via ORAL
  Filled 2019-11-28 (×9): qty 1

## 2019-11-28 MED ORDER — WARFARIN SODIUM 5 MG PO TABS: 5.0000 mg | ORAL_TABLET | Freq: Every day | ORAL | Status: DC

## 2019-11-28 MED ORDER — ACETAMINOPHEN 500 MG PO TABS
1000.0000 mg | ORAL_TABLET | Freq: Once | ORAL | Status: AC
  Administered 2019-11-28: 1000 mg via ORAL
  Filled 2019-11-28: qty 2

## 2019-11-28 MED ORDER — WARFARIN SODIUM 5 MG PO TABS
5.0000 mg | ORAL_TABLET | Freq: Once | ORAL | Status: AC
  Administered 2019-11-28: 5 mg via ORAL
  Filled 2019-11-28: qty 1

## 2019-11-28 MED ORDER — METHYLPREDNISOLONE SODIUM SUCC 40 MG IJ SOLR
40.0000 mg | Freq: Two times a day (BID) | INTRAMUSCULAR | Status: DC
  Administered 2019-11-28 – 2019-12-04 (×13): 40 mg via INTRAVENOUS
  Filled 2019-11-28 (×13): qty 1

## 2019-11-28 MED ORDER — ZINC SULFATE 220 (50 ZN) MG PO CAPS
220.0000 mg | ORAL_CAPSULE | Freq: Every day | ORAL | Status: DC
  Administered 2019-11-28 – 2019-12-04 (×7): 220 mg via ORAL
  Filled 2019-11-28 (×7): qty 1

## 2019-11-28 MED ORDER — METOCLOPRAMIDE HCL 10 MG PO TABS
5.0000 mg | ORAL_TABLET | Freq: Two times a day (BID) | ORAL | Status: DC
  Administered 2019-11-28 – 2019-11-29 (×4): 5 mg via ORAL
  Filled 2019-11-28 (×4): qty 1

## 2019-11-28 MED ORDER — ASCORBIC ACID 500 MG PO TABS
500.0000 mg | ORAL_TABLET | Freq: Every day | ORAL | Status: DC
  Administered 2019-11-28 – 2019-12-04 (×7): 500 mg via ORAL
  Filled 2019-11-28 (×7): qty 1

## 2019-11-28 MED ORDER — LEVOTHYROXINE SODIUM 25 MCG PO TABS
75.0000 ug | ORAL_TABLET | Freq: Every day | ORAL | Status: DC
  Administered 2019-11-29 – 2019-12-07 (×9): 75 ug via ORAL
  Filled 2019-11-28 (×3): qty 1
  Filled 2019-11-28: qty 3
  Filled 2019-11-28: qty 2
  Filled 2019-11-28 (×4): qty 1

## 2019-11-28 MED ORDER — DM-GUAIFENESIN ER 30-600 MG PO TB12
1.0000 | ORAL_TABLET | Freq: Two times a day (BID) | ORAL | Status: DC
  Administered 2019-11-28 – 2019-12-06 (×17): 1 via ORAL
  Filled 2019-11-28 (×18): qty 1

## 2019-11-28 NOTE — Progress Notes (Signed)
Attempted PICC line right brachial vein. Unable to thread PICC line past shoulder, resistance met. Patient did not have any other viable veins on right side. Patient with h/o left sided breast cancer with mastectomy and lymphadenectomy and h/o portacath on right side. Patient should be referred to IR for PICC placement for future PICC line placement since RN PICC team unable to thread line past shoulder. VAST RN able to place peripheral IV in right forearm. Primary RN aware. Dr Rafael Bihari aware and wanting central line. [facility] in ED placing a central line currently and they were notified of central line need for patient.

## 2019-11-28 NOTE — Progress Notes (Signed)
RN states Theme park manager nurse currently in with pt.

## 2019-11-28 NOTE — Progress Notes (Addendum)
Patient Demographics:    [name], is a 49 y.o. female, DOB - 25-Apr-1970, PPH:[id number]  Admit date - 11/26/2019   Admitting Physician Bernadette Hoit, DO  Outpatient Primary MD for the patient is Sharilyn Sites, MD  LOS - 0   Chief Complaint  Patient presents with  . Shortness of Breath        Subjective:    [name] today has   No chest pain,   -fever has resolved-- T max 101.8 Nausea persist but no emesis No abd pain , no diarrhea -Hypoxia improving, down to 3 L of oxygen via nasal cannula  Assessment  & Plan :    Principal Problem:   Acute respiratory failure with hypoxia (HCC) Active Problems:   History of mitral valve replacement with mechanical valve   Systemic lupus erythematosus (HCC)   Gastroparesis   Primary hypothyroidism   Hypokalemia   Hypophosphatemia   Transaminitis   Hypoalbuminemia   Brief Summary:- 49 y.o.femalewith medical history significant forSLE, rheumatoid arthritis, Raynaud's, hypothyroidism, and gastroparesis with persistent nausea as well as mitral valve replacement on chronic anticoagulation and recent COVID-19 infection/pneumonia --- recently discharged home with home O2 on 11/26/19 ---Readmitted on 11/28/2019 with worsening hypoxia and dyspnea and significant tachycardia and tachypnea---   A/p 1)Acute respiratory failuredue to progression of Covid pneumonia-readmitted on 11/28/2019 with worsening hypoxia dyspnea and significant tachycardia tachypnea --despite completing treatment with steroids and remdesivir. -CTA chest on 11/28/2019 with bilateral groundglassopacities consistent with Covid pneumonia along with elevated pulmonary artery pressures but no evidence of PE -- patient is on warfarin-INR has been supratherapeutic recently not down to 2.2 -Procalcitonin is up to 0.34 from less than 0.10 recently -Patient developed new onset fevers on  11/28/2019 -We will treat empirically with IV ceftriaxone and azithromycin for possible superimposed bacterial infection--pending culture results -COVID-19 admission protocol -Mucolytic's, inhalers as needed -Patient has already completed course of remdesivir on recent hospitalization -Initially required high flow oxygen now weaned down to 3 L of oxygen via nasal cannula -Continue bronchodilators and mucolytics  Rheumatoid arthritis, SLE, immunosuppression-stable. - Hold rituximab -As she has been off her immunosuppressants for a while, patient should discuss with her rheumatologist on discharge if she can get the Covid vaccine in the near future  Mitral valve replacement with mechanical valve, supratherapeutic INR Her anticoagulation goal INR is 2.5-3.5.  Hemoglobin is stable no sign of bleeding. -INR -was recently supratherapeutic, currently down to 2.2 -Pharmacy to manage Coumadin  Gastroparesis Continue home metoclopramide, Prn Phenergan  Hypothyroidism Continue home Synthroid  -Hypokalemia and hypophosphatemia--- replace and recheck  SVT/tachyarrhythmia--- in the setting of electrolyte derangement and persistent profound hypoxia--- appears to have resolved  Elevated transaminitis AST 58, ALT 71 Continue to monitor liver panel  Hypoalbuminemia possibly secondary to moderate protein calorie malnutrition Albumin 2.9; this is possibly due to decreased appetite secondary to Covid 19 pneumonia Dietary will be consulted  --Total Care time over 43 minutes  Code Status:Full code Family Communication:Updated patient's daughter at patient's request, patient's daughter is an Therapist, sports at this facility Consults called:None   Disposition/Need for in-Hospital Stay- patient unable to be discharged at this time due to --- fevers and worsening hypoxia raising concern for possible superimposed bacterial pneumonia on underlying Covid pneumonia requiring  IV antibiotics and IV steroids  pending further clinical and culture data--*  Status is: Inpatient  Remains inpatient appropriate because: fevers and worsening hypoxia raising concern for possible superimposed bacterial pneumonia on underlying Covid pneumonia requiring IV antibiotics and IV steroids pending further clinical and culture data--*   Disposition: The patient is from: Home              Anticipated d/c is to: Home              Anticipated d/c date is: > 3 days              Patient currently is not medically stable to d/c. Barriers: Not Clinically Stable-  fevers and worsening hypoxia raising concern for possible superimposed bacterial pneumonia on underlying Covid pneumonia requiring IV antibiotics and IV steroids pending further clinical and culture data--*  Code Status : full code  Family Communication:    (patient is alert, awake and coherent)   Consults  :  na  DVT Prophylaxis  : Coumadin- SCDs   Lab Results  Component Value Date   PLT 322 12/15/2019    Inpatient Medications  Scheduled Meds: . vitamin C  500 mg Oral Daily  . dextromethorphan-guaiFENesin  1 tablet Oral BID  . [START ON 11/29/2019] levothyroxine  75 mcg Oral QAC breakfast  . methylPREDNISolone (SOLU-MEDROL) injection  40 mg Intravenous Q12H  . metoCLOPramide  5 mg Oral BID  . multivitamin with minerals  1 tablet Oral Daily  . pantoprazole (PROTONIX) IV  40 mg Intravenous Q24H  . warfarin  5 mg Oral ONCE-1600  . Warfarin - Pharmacist Dosing Inpatient   Does not apply q1600  . zinc sulfate  220 mg Oral Daily   Continuous Infusions: . azithromycin    . [START ON 11/29/2019] cefTRIAXone (ROCEPHIN)  IV    . potassium PHOSPHATE IVPB (in mmol) 30 mmol (11/28/19 1227)   PRN Meds:.chlorpheniramine-HYDROcodone, ipratropium-albuterol, promethazine    Anti-infectives (From admission, onward)   Start     Dose/Rate Route Frequency Ordered Stop   11/29/19 0400  cefTRIAXone (ROCEPHIN) 2 g in sodium chloride 0.9 % 100 mL IVPB        2  g 200 mL/hr over 30 Minutes Intravenous Every 24 hours 11/28/19 1427     11/28/19 1500  azithromycin (ZITHROMAX) 500 mg in sodium chloride 0.9 % 250 mL IVPB        500 mg 250 mL/hr over 60 Minutes Intravenous Every 24 hours 11/28/19 1427     11/28/19 0500  azithromycin (ZITHROMAX) tablet 500 mg        500 mg Oral  Once 11/28/19 0451 11/28/19 0515   11/28/19 0300  cefTRIAXone (ROCEPHIN) 2 g in sodium chloride 0.9 % 100 mL IVPB        2 g 200 mL/hr over 30 Minutes Intravenous  Once 11/28/19 0249 11/28/19 0501   11/28/19 0300  azithromycin (ZITHROMAX) 500 mg in sodium chloride 0.9 % 250 mL IVPB  Status:  Discontinued        500 mg 250 mL/hr over 60 Minutes Intravenous Every 24 hours 11/28/19 0249 11/28/19 0451        Objective:   Vitals:   11/28/19 0300 11/28/19 0330 11/28/19 0500 11/28/19 1021  BP: (!) 96/53 104/64    Pulse: (!) 101 (!) 101 88   Resp: (!) 30 (!) 29 (!) 28   Temp:      TempSrc:      SpO2: 91% 97% 99% 97%  Wt Readings from Last 3 Encounters:  11/24/19 67.6 kg  11/09/19 69.4 kg  07/16/19 76.2 kg     Intake/Output Summary (Last 24 hours) at 11/28/2019 1433 Last data filed at 11/28/2019 0501 Gross per 24 hour  Intake 600 ml  Output --  Net 600 ml     Physical Exam  Gen:- Awake Alert,  In no apparent distress  HEENT:- [place].AT, No sclera icterus Nose- [place] 3L/min  Neck-Supple Neck,, IJ central line Lungs-diminished breath sounds, scattered rhonchi  CV- S1, S2 normal, regular  Abd-  +ve B.Sounds, Abd Soft, No tenderness,    Extremity/Skin:- No  edema, pedal pulses present  Psych-affect is appropriate, oriented x3 Neuro-generalized weakness and deconditioning ,no new focal deficits, no tremors   Data Review:   Micro Results Recent Results (from the past 240 hour(s))  Culture, blood (Routine X 2) w Reflex to ID Panel     Status: None (Preliminary result)   Collection Time: 11/28/19 12:15 PM   Specimen: BLOOD RIGHT FOREARM  Result Value Ref Range Status    Specimen Description   Final    BLOOD RIGHT FOREARM BOTTLES DRAWN AEROBIC AND ANAEROBIC   Special Requests   Final    Blood Culture adequate volume Performed at [facility], [address], [place],  [address]    Culture PENDING  Incomplete   Report Status PENDING  Incomplete    Radiology Reports CT ABDOMEN PELVIS WO CONTRAST  Result Date: 11/28/2019 CLINICAL DATA:  Centralized abdomen pain and nausea for 1 week. Patient's [id number]-[id number] positive. EXAM: CT ABDOMEN AND PELVIS WITHOUT CONTRAST TECHNIQUE: Multidetector CT imaging of the abdomen and pelvis was performed following the standard protocol without IV contrast. COMPARISON:  November 28, 2016 FINDINGS: Lower chest: Extensive patchy consolidation is identified throughout bilateral lung bases. Hepatobiliary: No focal liver abnormality is seen. Status post cholecystectomy. No biliary dilatation. Pancreas: Pancreatic calcification is identified unchanged compared prior exam. There is no evidence of pancreatitis. No focal mass is identified in the pancreas. Spleen: Normal in size without focal abnormality. Adrenals/Urinary Tract: Bilateral adrenal glands are normal. Left kidney cyst is identified in the upper pole unchanged. Right kidney is normal. There is no hydronephrosis bilaterally. Contrast filled bladder is normal. Stomach/Bowel: Stomach is within normal limits. Surgical clips are identified near the cecum suggesting prior appendectomy. No evidence of bowel wall thickening, distention, or inflammatory changes. Vascular/Lymphatic: Aortic atherosclerosis. No enlarged abdominal or pelvic lymph nodes. Reproductive: Uterus and bilateral adnexa are unremarkable. Other: No abdominal wall hernia or abnormality. No abdominopelvic ascites. Musculoskeletal: Degenerative joint changes of the spine are noted. IMPRESSION: 1. No acute abnormality identified in the abdomen and pelvis. 2. Extensive patchy consolidation is identified throughout bilateral lung  bases consistent with pneumonia. 3. Aortic atherosclerosis. Aortic Atherosclerosis (ICD10-I70.0). Electronically Signed   By: Abelardo Diesel M.D.   On: 11/28/2019 09:19   DG Chest 1 View  Result Date: 11/28/2019 CLINICAL DATA:  Central line placement.  History of COVID-19. EXAM: CHEST  1 VIEW COMPARISON:  Chest radiograph 12/05/2019 FINDINGS: No central venous catheter tip is present with tip projecting over the right atrium. Monitoring leads overlie the patient. Stable cardiac and mediastinal contours. Interval increase in bilateral mid lower lung airspace opacities. No pleural effusion or pneumothorax. Similar-appearing linear radiodensity projecting over the right lower hemithorax. IMPRESSION: 1. Central venous catheter tip projects over the right atrium. 2. Increasing bilateral mid and lower lung airspace opacities. Electronically Signed   By: Lovey Newcomer M.D.   On: 11/28/2019  14:18   CT Head Wo Contrast  Result Date: 11/09/2019 CLINICAL DATA:  Pain following recent fall EXAM: CT HEAD WITHOUT CONTRAST TECHNIQUE: Contiguous axial images were obtained from the base of the skull through the vertex without intravenous contrast. COMPARISON:  None. FINDINGS: Brain: The ventricles and sulci are normal in size and configuration. There is no intracranial mass, hemorrhage, extra-axial fluid collection, or midline shift. The brain parenchyma appears normal. There is no demonstrable acute infarct. Vascular: No hyperdense vessel. No appreciable vascular calcification. Skull: The bony calvarium appears intact. Sinuses/Orbits: There is mucosal thickening in the maxillary antra bilaterally, more severe on the right than on the left. There is opacification of multiple ethmoid air cells. There is opacification in the frontal sinuses bilaterally. Orbits appear symmetric bilaterally. Other: The mastoid air cells are clear. IMPRESSION: Brain parenchyma appears unremarkable.  No mass or hemorrhage. Multifocal paranasal sinus  disease noted. Electronically Signed   By: Lowella Grip III M.D.   On: 11/09/2019 09:39   CT Angio Chest PE W/Cm &/Or Wo Cm  Result Date: 11/28/2019 CLINICAL DATA:  COVID positive.  Shortness of breath. EXAM: CT ANGIOGRAPHY CHEST WITH CONTRAST TECHNIQUE: Multidetector CT imaging of the chest was performed using the standard protocol during bolus administration of intravenous contrast. Multiplanar CT image reconstructions and MIPs were obtained to evaluate the vascular anatomy. CONTRAST:  141mL OMNIPAQUE IOHEXOL 350 MG/ML SOLN COMPARISON:  January 30, 2013 FINDINGS: Cardiovascular: Examination is limited by respiratory motion artifact. Within that limitation, there is no central pulmonary embolus. The size of the main pulmonary artery is enlarged, measuring 3.6 cm. Heart size is normal, with no pericardial effusion. The course and caliber of the aorta are normal. There is no atherosclerotic calcification. Opacification decreased due to pulmonary arterial phase contrast bolus timing. Mediastinum/Nodes: -- No mediastinal lymphadenopathy. -- No hilar lymphadenopathy. -- No axillary lymphadenopathy. -- No supraclavicular lymphadenopathy. -- Normal thyroid gland where visualized. -the esophagus is fluid-filled to the level of the upper thorax. Lungs/Pleura: There are hazy bilateral ground-glass airspace opacities. There is no pneumothorax. There is a trace left-sided pleural effusion with adjacent atelectasis. Upper Abdomen: Contrast bolus timing is not optimized for evaluation of the abdominal organs. The visualized portions of the organs of the upper abdomen are normal. Musculoskeletal: No chest wall abnormality. No bony spinal canal stenosis. Review of the MIP images confirms the above findings. IMPRESSION: 1. Examination is limited by respiratory motion artifact. Within that limitation, there is no central pulmonary embolus. 2. Bilateral ground-glass airspace opacities, which can be seen in patients with an  atypical infectious process such as viral pneumonia. 3. Trace left-sided pleural effusion with adjacent atelectasis. 4. Dilated main pulmonary artery, which can be seen in patients with elevated pulmonary artery pressures. Electronically Signed   By: Constance Holster M.D.   On: 11/28/2019 02:29   DG Chest Port 1 View  Result Date: 11/23/2019 CLINICAL DATA:  Shortness of breath with hypoxia, history of COVID-19 positivity EXAM: PORTABLE CHEST 1 VIEW COMPARISON:  11/24/2019 FINDINGS: Cardiac shadow is stable. Bilateral airspace opacities are again identified and stable. Small right-sided pleural effusion is noted which is new from the prior study. No bony abnormality is seen. IMPRESSION: Stable airspace opacities consistent with the given clinical history. Small right-sided pleural effusion. Electronically Signed   By: Inez Catalina M.D.   On: 12/04/2019 23:13   DG Chest Portable 1 View  Result Date: 11/24/2019 CLINICAL DATA:  Shortness of breath, history of breast cancer, recent COVID admission EXAM: PORTABLE  CHEST 1 VIEW COMPARISON:  11/09/2019 FINDINGS: Bilateral pulmonary opacities are present, greatest in the lower left lung. This has increased since the prior study. No pleural effusion or pneumothorax. Stable cardiomediastinal contours with normal heart size. IMPRESSION: Bilateral pulmonary opacities, left greater than right. This is increased since 11/09/2019. Electronically Signed   By: Macy Mis M.D.   On: 11/24/2019 17:18   DG Chest Port 1 View  Result Date: 11/09/2019 CLINICAL DATA:  Cough and fever for the past week. EXAM: PORTABLE CHEST 1 VIEW COMPARISON:  Chest x-ray dated May 21, 2016. FINDINGS: The heart size and mediastinal contours are within normal limits. Normal pulmonary vascularity. Similar retained epicardial pacing wires overlying the right chest. New patchy opacities at the peripheral right lung base. No focal consolidation, pleural effusion, or pneumothorax. No acute  osseous abnormality. IMPRESSION: 1. Right lower lobe pneumonia with appearance concerning for atypical infection, including viral pneumonia. Electronically Signed   By: Titus Dubin M.D.   On: 11/09/2019 09:04   [place] EKG SITE RITE  Result Date: 11/28/2019 If Site Rite image not attached, placement could not be confirmed due to current cardiac rhythm.    CBC Recent Labs  Lab 11/24/19 1636 11/25/19 0640 12/18/2019 2337  WBC 6.3 4.3 9.1  HGB 12.5 10.9* 11.8*  HCT 37.3 33.3* 35.4*  PLT 324 303 322  MCV 90.3 90.5 91.0  MCH 30.3 29.6 30.3  MCHC 33.5 32.7 33.3  RDW 13.7 13.4 13.7  LYMPHSABS 0.3* 0.4* 0.5*  MONOABS 0.2 0.2 0.2  EOSABS 0.0 0.0 0.1  BASOSABS 0.0 0.0 0.0    Chemistries  Recent Labs  Lab 11/24/19 1636 11/25/19 0640 12/10/2019 2337  NA 134* 134* 135  K 3.9 3.8 3.4*  CL 96* 98 96*  CO2 26 26 30   GLUCOSE 95 128* 97  BUN 18 24* 15  CREATININE 0.91 0.70 0.72  CALCIUM 8.8* 8.4* 8.4*  MG  --   --  2.0  AST  --  26 58*  ALT  --  23 71*  ALKPHOS  --  66 90  BILITOT  --  0.7 0.9   ------------------------------------------------------------------------------------------------------------------ No results for input(s): CHOL, HDL, LDLCALC, TRIG, CHOLHDL, LDLDIRECT in the last 72 hours.  No results found for: HGBA1C ------------------------------------------------------------------------------------------------------------------ No results for input(s): TSH, T4TOTAL, T3FREE, THYROIDAB in the last 72 hours.  Invalid input(s): FREET3 ------------------------------------------------------------------------------------------------------------------ No results for input(s): VITAMINB12, FOLATE, FERRITIN, TIBC, IRON, RETICCTPCT in the last 72 hours.  Coagulation profile Recent Labs  Lab 11/24/19 1636 11/25/19 0640 11/26/19 0642 11/28/2019 2337  INR 5.1* 6.7* 4.1* 2.2*    Recent Labs    11/26/19 0642  DDIMER 0.36    Cardiac Enzymes No results for input(s): CKMB,  TROPONINI, MYOGLOBIN in the last 168 hours.  Invalid input(s): CK ------------------------------------------------------------------------------------------------------------------    Component Value Date/Time   BNP 84.0 12/09/2019 2337    Roxan Hockey M.D on 11/28/2019 at 2:33 PM  Go to [email or web address] - for contact info  [facility] - Office  [phone]

## 2019-11-28 NOTE — Progress Notes (Addendum)
ANTICOAGULATION CONSULT NOTE -   Pharmacy Consult for warfarin dosing Indication:  Mechanical valve  Allergies  Allergen Reactions  . Magnesium-Containing Compounds Other (See Comments)    Chest pain to IV mag  . Methotrexate Derivatives Other (See Comments)    Raised liver enzymes   . 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 kph    Vital Signs: Temp: 101.8 F (38.8 C) (10/09 0250) Temp Source: Oral (10/09 0250) BP: 104/64 (10/09 0330) Pulse Rate: 88 (10/09 0500)  Labs: Recent Labs    11/26/19 0642 11/28/2019 2337 11/28/19 0400  HGB  --  11.8*  --   HCT  --  35.4*  --   PLT  --  322  --   LABPROT 38.8* 23.6*  --   INR 4.1* 2.2*  --   CREATININE  --  0.72  --   TROPONINIHS  --  11 15    Estimated Creatinine Clearance: 78.7 mL/min (by C-G formula based on SCr of 0.72 mg/dL).   Medical History: Past Medical History:  Diagnosis Date  . Abnormal Papanicolaou smear of cervix with positive human papilloma virus (HPV) test 08/17/2015  . Anticoagulation goal of INR 2.5 to 3.5   . Anxiety   . B12 deficiency   . BRCA1 negative   . BRCA2 negative   . Breast cancer, left breast ([place]) 2011   S/P mastectomy; chemo; "no radiation due to lupus"  . Bursitis of right knee    Septic bursitis  . Chronic lower back pain   . Drug-induced hepatitis    States per her rheumatologist, transaminases elevated but normalized after drug removed for   . Fibromyalgia   . GERD (gastroesophageal reflux disease)   . 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 History of abnormal cervical Pap smear 08/12/2015  . History of blood transfusion   . HSIL (high grade squamous intraepithelial lesion) on Pap smear of cervix 01/14/2017   colpo with Dr Elonda Husky   . Hypothyroidism   . Mitral valve disease, rheumatic    [employer]. [employer] prosthesis  . Peripheral neuropathy   . Pneumonia   .  Raynaud phenomenon   . Rheumatoid arthritis ([place])   . SLE (systemic lupus erythematosus) ([place])     Assessment: Pharmacy consulted to dose warfarin  for this 49 yo female on chronic anti-coagulation therapy with warfarin for history of mechanical valve .  Patient was admitted with high INR of 4.1 on 10/7 and took her last dose of warfarin 2.23m on 11/26/19.  Patient is currently having nausea, possibly affecting Vit K intake.  Home warfarin dose:  571mon Sun/Mon/Wed/Fri and 7.35m46mn Tues/Thurs/Sat Dose recently reduced to 2.35mg56md several doses were held last week.      Goal of Therapy:  INR Goal: 2.5-3.5 Monitor platelets by anticoagulation protocol: Yes   Plan:  Discontinue prophylactic Lovenox Give warfarin  35mg 28mdose today for INR 2.2 today Daily INR and every other day CBC Monitor for signs and symptoms of bleeding.   TamarDespina Pole/2021,1:59 PM

## 2019-11-28 NOTE — ED Notes (Signed)
Pt c/o cough, throat pain, and nausea- will address with PRN medications

## 2019-11-28 NOTE — H&P (Signed)
History and Physical  [name] YQI:[id number] DOB: [birth year] DOA: 12/03/2019  Referring physician: Maudie Flakes, MD PCP: Sharilyn Sites, MD  Patient coming from: Home  Chief Complaint: Shortness of breath  HPI: [name] is a 49 y.o. female with medical history significant for SLE on rituximab, rheumatoid arthritis, Raynaud's, mitral valve replacement on chronic anticoagulation and hypothyroidism who presents to the emergency department due to 1 day onset of worsening shortness of breath.  Patient was admitted from 10/5-10/7 due to acute respiratory failure with hypoxia secondary to progression of Covid pneumonia, patient was discharged with prednisone taper.  Patient was unable to provide a history due to worsening shortness of breath, history was obtained from daughter (a nurse here at [facility]) and ED physician.  Patient has been compliant with medication, but she continues to have O2 sat in the low 80s despite being on 3 LPM of oxygen.  Yesterday, patient was in bed all day due to weakness, shortness of breath and increased work of breathing, so daughter brought her back to the ED for further evaluation and management.  She was also noted to be admitted from 9/20 - 9/22 for Covid pneumonia,  ED Course:  In the emergency department, she was noted to be febrile with a temperature of 101.37F and tachypneic.  O2 sat was in the low 80s on 3 LPM, so she was initially placed on HFNC at 15 LPM with O2 sat in the low 90s, she was then transitioned to NRB at 15 LPM with O2 sat at 97-100%.  Patient was also noted to be going in and out of SVT at a rate in the 200s with soft BP.  CT angiography of chest with contrast showed no central pulmonary embolus, but showed bilateral groundglass airspace opacities suspected to be infectious due to viral pneumonia.  She was empirically started on IV ceftriaxone and azithromycin.  IV Decadron 6 Mg x1 was given and IV hydration with 500 mL of NS was also given.   Tylenol was given due to fever.  Hospitalist was asked to admit.  For further evaluation and management.  Review of Systems: Constitutional: Negative for chills and fever.  HENT: Negative for ear pain and sore throat.   Eyes: Negative for pain and visual disturbance.  Respiratory: Positive for  shortness of breath.     Cardiovascular: Negative for chest pain and palpitations.  Gastrointestinal: Negative for abdominal pain and vomiting.  Endocrine: Negative for polyphagia and polyuria.  Genitourinary: Negative for decreased urine volume, dysuria Musculoskeletal: Negative for arthralgias and back pain.  Skin: Negative for color change and rash.  Allergic/Immunologic: Negative for immunocompromised state.  Neurological: Negative for tremors, syncope, speech difficulty, weakness, light-headedness and headaches.  Hematological: Does not bruise/bleed easily.  All other systems reviewed and are negative   Past Medical History:  Diagnosis Date  . Abnormal Papanicolaou smear of cervix with positive human papilloma virus (HPV) test 08/17/2015  . Anticoagulation goal of INR 2.5 to 3.5   . Anxiety   . B12 deficiency   . BRCA1 negative   . BRCA2 negative   . Breast cancer, left breast ([place]) 2011   S/P mastectomy; chemo; "no radiation due to lupus"  . Bursitis of right knee    Septic bursitis  . Chronic lower back pain   . Drug-induced hepatitis    States per her rheumatologist, transaminases elevated but normalized after drug removed for   . Fibromyalgia   . GERD (gastroesophageal reflux disease)   .  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 History of abnormal cervical Pap smear 08/12/2015  . History of blood transfusion   . HSIL (high grade squamous intraepithelial lesion) on Pap smear of cervix 01/14/2017   colpo with Dr Elonda Husky   . Hypothyroidism   . Mitral valve disease, rheumatic    St. Jude prosthesis  . Peripheral neuropathy   . Pneumonia   . Raynaud  phenomenon   . Rheumatoid arthritis ([place])   . SLE (systemic lupus erythematosus) ([place])    Past Surgical History:  Procedure Laterality Date  . APPENDECTOMY N/A 05/23/2016   Procedure: OPEN APPENDECTOMY WITH DRAINAGE OF ABSCESS;  Surgeon: Fanny Skates, MD;  Location: [facility];  Service: General;  Laterality: N/A;  . APPENDECTOMY    . BREAST BIOPSY Left 2012  . BREAST IMPLANT EXCHANGE Left 12/2015  . CARDIAC CATHETERIZATION  2008  . CARDIAC VALVE REPLACEMENT    . CERVICAL CONIZATION W/BX N/A 03/06/2017   Procedure: Laser Conization of Cervix;  Surgeon: Florian Buff, MD;  Location: [facility];  Service: Gynecology;  Laterality: N/A;  . CORNEAL TRANSPLANT Left   . ENDOMETRIAL ABLATION  2008   She no longer has menses  . ESOPHAGOGASTRODUODENOSCOPY N/A 03/25/2013   Dr. Raliegh Scarlet reflux esophagitis-likely source of patient's symptoms (patulous EG junction). Hiatal hernia otherwise normal  . LAPAROSCOPIC CHOLECYSTECTOMY    . LIVER BIOPSY  11/28/2016   liver core/notes 11/28/2016  . MASTECTOMY Left 2012  . MASTOPEXY  02/14/2011   Procedure: MASTOPEXY;  Surgeon: Macon Large;  Location: [facility];  Service: Plastics;  Laterality: Bilateral;  Right Breast Reduction   . MITRAL VALVE REPLACEMENT  2008  . TISSUE EXPANDER PLACEMENT  02/14/2011   Procedure: TISSUE EXPANDER;  Surgeon: Macon Large;  Location: [facility];  Service: Plastics;  Laterality: Bilateral;  Left Breast Remove Tissue Expander Placement of Implant Breast Reconstruction  . TISSUE EXPANDER PLACEMENT Left 11/16/2010   breasst  . TUBAL LIGATION      Social History:  reports that she quit smoking about 13 years ago. Her smoking use included cigarettes. She has a 15.00 pack-year smoking history. She has never used smokeless tobacco. She reports previous alcohol use. She reports that she does not use drugs.   Allergies  Allergen Reactions  . Magnesium-Containing Compounds Other (See Comments)    Chest pain to IV mag  . Methotrexate  Derivatives Other (See Comments)    Raised liver enzymes   . 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 kph    Family History  Problem Relation Age of Onset  . Hypertension Mother   . Hyperlipidemia Mother   . Hypertension Father   . Colon cancer Neg Hx   . Colon polyps Neg Hx    Prior to Admission medications   Medication Sig Start Date End Date Taking? Authorizing Provider  acetaminophen (TYLENOL) 325 MG tablet Take 2 tablets (650 mg total) by mouth every 6 (six) hours as needed for mild pain or headache (fever >/= 101). 11/11/19   Barton Dubois, MD  albuterol (VENTOLIN HFA) 108 (90 Base) MCG/ACT inhaler Inhale 2 puffs into the lungs 3 (three) times daily. 11/26/19   Roxan Hockey, MD  ascorbic acid (VITAMIN C) 500 MG tablet Take 1 tablet (500 mg total) by mouth daily. 12/13/2019   Roxan Hockey, MD  azithromycin (ZITHROMAX) 500 MG  tablet Take 1 tablet (500 mg total) by mouth daily for 5 days. 11/26/19 12/01/19  Roxan Hockey, MD  bromfenac (XIBROM) 0.09 % ophthalmic solution Place 1 drop into the left eye.  06/23/19   [provider]  buPROPion (WELLBUTRIN XL) 300 MG 24 hr tablet Take 300 mg by mouth daily. Patient not taking: Reported on 11/24/2019 10/21/19   [provider]  chlorpheniramine-HYDROcodone (TUSSIONEX PENNKINETIC ER) 10-8 MG/5ML SUER Take 5 mLs by mouth every 12 (twelve) hours as needed for cough. 11/26/19   Roxan Hockey, MD  cyanocobalamin (,VITAMIN B-12,) 1000 MCG/ML injection Inject 1 mL (1,000 mcg total) into the muscle every 30 (thirty) days. 12/19/10   Everardo All, MD  cycloSPORINE (RESTASIS) 0.05 % ophthalmic emulsion Place 1 drop into the left eye daily.    [provider]  DEXILANT 60 MG capsule TAKE ONE CAPSULE BY MOUTH DAILY. Patient taking differently: Take 60 mg by mouth daily.  09/25/19   Mahala Menghini, PA-C  furosemide (LASIX) 40  MG tablet Take 0.5 tablets (20 mg total) by mouth daily. 11/11/19   Barton Dubois, MD  levothyroxine (SYNTHROID) 75 MCG tablet Take 1 tablet (75 mcg total) by mouth daily before breakfast. 07/15/19   Nida, Marella Chimes, MD  metoCLOPramide (REGLAN) 5 MG tablet TAKE (1) TABLET BY MOUTH TWICE DAILY. Patient taking differently: Take 5 mg by mouth in the morning and at bedtime.  02/03/19   Annitta Needs, NP  nepafenac (NEVANAC) 0.1 % ophthalmic suspension Place 1 drop into the left eye daily.  06/19/19   [provider]  ondansetron (ZOFRAN) 4 MG tablet Take 1 tablet (4 mg total) by mouth every 8 (eight) hours as needed for nausea or vomiting. 10/09/17   Annitta Needs, NP  polyethylene glycol (MIRALAX / GLYCOLAX) 17 g packet Take 17 g by mouth daily as needed for mild constipation. 11/26/19   Roxan Hockey, MD  potassium chloride (KLOR-CON 10) 10 MEQ tablet Take 10 mEq by mouth every evening. Take one tablet daily as directed 11/09/13   [provider]  prednisoLONE acetate (PRED FORTE) 1 % ophthalmic suspension Place 1 drop into the left eye daily.  09/28/19   [provider]  predniSONE (DELTASONE) 10 MG tablet Take 1 tablet (10 mg total) by mouth See admin instructions. Take 40 mg (4tab) daily for 4 days, then 30 mg daily for 4 days, then 20 mg (2 tab) daily for 4 days , then 10 mg (1 Tab) daily for 4 days, then STOP 11/26/19   Roxan Hockey, MD  promethazine (PHENERGAN) 12.5 MG tablet Take 1 tablet (12.5 mg total) by mouth every 6 (six) hours as needed for nausea or vomiting. 10/09/17   Annitta Needs, NP  riTUXimab (RITUXAN) 100 MG/10ML injection Inject 10 mLs (100 mg total) into the vein See admin instructions. Every 4 months; please hold next injection until discussion with rheumatology service 11/11/19   Barton Dubois, MD  rOPINIRole (REQUIP) 1 MG tablet Take 1 mg by mouth at bedtime.    [provider]  warfarin (COUMADIN) 5 MG tablet Take 1 tablet (5 mg total) by  mouth daily. 11/26/19   Roxan Hockey, MD  zinc sulfate 220 (50 Zn) MG capsule Take 1 capsule (220 mg total) by mouth daily. 12/13/2019   Roxan Hockey, MD  zolpidem (AMBIEN) 10 MG tablet Take 10 mg by mouth at bedtime.  12/19/10   de Stanford Scotland, MD    Physical Exam: BP 104/64  Pulse 88   Temp (!) 101.8 F (38.8 C) (Oral)   Resp (!) 28   SpO2 99%   . General: 49 y.o. year-old female ill appearing but in no acute distress.  Alert and oriented x3. Marland Kitchen HEENT: NCAT, EOMI . Neck: Supple, trachea media . Cardiovascular: Regular rate and rhythm with no rubs or gallops.  No thyromegaly or JVD noted.  No lower extremity edema. 2/4 pulses in all 4 extremities. Marland Kitchen Respiratory: Tachypnea.  Bilateral decreased breath sounds with no audible wheezes or rales.   . Abdomen: Soft nontender nondistended with normal bowel sounds x4 quadrants. . Muskuloskeletal: No cyanosis, clubbing or edema noted bilaterally . Neuro: CN II-XII intact, strength, sensation, reflexes . Skin: No ulcerative lesions noted or rashes . Psychiatry: Judgement and insight appear normal. Mood is appropriate for condition and setting          Labs on Admission:  Basic Metabolic Panel: Recent Labs  Lab 11/24/19 1636 11/25/19 0640 12/04/2019 2337 11/28/19 0400  NA 134* 134* 135  --   K 3.9 3.8 3.4*  --   CL 96* 98 96*  --   CO2 _0 --   GLUCOSE 95 128* 97  --   BUN 18 24* 15  --   CREATININE 0.91 0.70 0.72  --   CALCIUM 8.8* 8.4* 8.4*  --   MG  --   --  2.0  --   PHOS  --   --   --  1.7*   Liver Function Tests: Recent Labs  Lab 11/25/19 0640 12/13/2019 2337  AST 26 58*  ALT 23 71*  ALKPHOS 66 90  BILITOT 0.7 0.9  PROT 6.7 6.6  ALBUMIN 3.0* 2.9*   No results for input(s): LIPASE, AMYLASE in the last 168 hours. No results for input(s): AMMONIA in the last 168 hours. CBC: Recent Labs  Lab 11/24/19 1636 11/25/19 0640 12/10/2019 2337  WBC 6.3 4.3 9.1  NEUTROABS 5.7 3.7 7.8*  HGB 12.5 10.9* 11.8*  HCT  37.3 33.3* 35.4*  MCV 90.3 90.5 91.0  PLT 324 303 322   Cardiac Enzymes: No results for input(s): CKTOTAL, CKMB, CKMBINDEX, TROPONINI in the last 168 hours.  BNP (last 3 results) Recent Labs    12/12/2019 2337  BNP 84.0    ProBNP (last 3 results) No results for input(s): PROBNP in the last 8760 hours.  CBG: No results for input(s): GLUCAP in the last 168 hours.  Radiological Exams on Admission: CT Angio Chest PE W/Cm &/Or Wo Cm  Result Date: 11/28/2019 CLINICAL DATA:  COVID positive.  Shortness of breath. EXAM: CT ANGIOGRAPHY CHEST WITH CONTRAST TECHNIQUE: Multidetector CT imaging of the chest was performed using the standard protocol during bolus administration of intravenous contrast. Multiplanar CT image reconstructions and MIPs were obtained to evaluate the vascular anatomy. CONTRAST:  124m OMNIPAQUE IOHEXOL 350 MG/ML SOLN COMPARISON:  January 30, 2013 FINDINGS: Cardiovascular: Examination is limited by respiratory motion artifact. Within that limitation, there is no central pulmonary embolus. The size of the main pulmonary artery is enlarged, measuring 3.6 cm. Heart size is normal, with no pericardial effusion. The course and caliber of the aorta are normal. There is no atherosclerotic calcification. Opacification decreased due to pulmonary arterial phase contrast bolus timing. Mediastinum/Nodes: -- No mediastinal lymphadenopathy. -- No hilar lymphadenopathy. -- No axillary lymphadenopathy. -- No supraclavicular lymphadenopathy. -- Normal thyroid gland where visualized. -the esophagus is fluid-filled to the level of the upper thorax. Lungs/Pleura: There are hazy bilateral  ground-glass airspace opacities. There is no pneumothorax. There is a trace left-sided pleural effusion with adjacent atelectasis. Upper Abdomen: Contrast bolus timing is not optimized for evaluation of the abdominal organs. The visualized portions of the organs of the upper abdomen are normal. Musculoskeletal: No chest  wall abnormality. No bony spinal canal stenosis. Review of the MIP images confirms the above findings. IMPRESSION: 1. Examination is limited by respiratory motion artifact. Within that limitation, there is no central pulmonary embolus. 2. Bilateral ground-glass airspace opacities, which can be seen in patients with an atypical infectious process such as viral pneumonia. 3. Trace left-sided pleural effusion with adjacent atelectasis. 4. Dilated main pulmonary artery, which can be seen in patients with elevated pulmonary artery pressures. Electronically Signed   By: Constance Holster M.D.   On: 11/28/2019 02:29   DG Chest Port 1 View  Result Date: 11/29/2019 CLINICAL DATA:  Shortness of breath with hypoxia, history of COVID-19 positivity EXAM: PORTABLE CHEST 1 VIEW COMPARISON:  11/24/2019 FINDINGS: Cardiac shadow is stable. Bilateral airspace opacities are again identified and stable. Small right-sided pleural effusion is noted which is new from the prior study. No bony abnormality is seen. IMPRESSION: Stable airspace opacities consistent with the given clinical history. Small right-sided pleural effusion. Electronically Signed   By: Inez Catalina M.D.   On: 12/10/2019 23:13   [place] EKG SITE RITE  Result Date: 11/28/2019 If Site Rite image not attached, placement could not be confirmed due to current cardiac rhythm.   EKG: I independently viewed the EKG done and my findings are as followed:  EKG showed SVT at rate of 209bpm  Assessment/Plan Present on Admission: . Acute respiratory failure with hypoxia ([place]) . Systemic lupus erythematosus ([place]) . Primary hypothyroidism . Hypokalemia . Gastroparesis  Principal Problem:   Acute respiratory failure with hypoxia (HCC) Active Problems:   History of mitral valve replacement with mechanical valve   Systemic lupus erythematosus ([facility])   Gastroparesis   Primary hypothyroidism   Hypokalemia   Hypophosphatemia   Transaminitis   Hypoalbuminemia  Acute  respiratory failure with hypoxia possibly secondary to persistent COVID-19 viral pneumonia, rule out concomitant bacterial component ABG showed hypoxia Patient was febrile, tachypneic and intermittently tachycardic (met SIRS criteria),, though without convincing evidence of sepsis at this time Patient already completed remdesivir during previous admissions Continue IV Solu-Medrol 40 mg twice daily Continue IV Protonix 40 mg to prevent steroid-induced ulcer She was treated with IV ceftriaxone and azithromycin in the ED, patient was treated with same antibiotics prior to being discharged, but she still spiked fever. Procalcitonin will be checked with plan to cover patient with HCAP broad-spectrum (considering the patient has been in and out of hospital with most recent discharge being on 10/7) Continue Mucinex, DuoNeb, incentive spirometry and flutter valve Patient is yet to obtain Covid vaccine due to her immunosuppressed state Continue supplemental oxygen to maintain O2 sat > or = 92%  Electrolyte abnormalities (hypokalemia, hypophosphatemia) K+ 3.4, phosphorus 1.7 These will be replenished  Elevated transaminitis AST 58, ALT 71 Continue to monitor liver panel  Hypoalbuminemia possibly secondary to moderate protein calorie malnutrition Albumin 2.9; this is possibly due to decreased appetite secondary to Covid 19 pneumonia Dietary will be consulted   Rheumatoid arthritis, SLE, immunosuppression-stable. Continue to hold rituximab  Mitral valve replacement with mechanical valve INR 2.2.  Continue Coumadin 5 mg daily, continue to monitor INR daily  Gastroparesis Continue home metoclopramide  Hypothyroidism Continue home Synthroid   DVT prophylaxis: Lovenox  Code Status:  Full code  Family Communication: Daughter at bedside (all questions answered to satisfaction)  Disposition Plan:  Patient is from:                        home Anticipated DC to:                   SNF or  family members home Anticipated DC date:               2-3 days Anticipated DC barriers:         Patient is unstable to be discharged at this time due to continuous respiratory failure due to COVID-19 virus pneumonia requiring supplemental oxygen at this time.   Consults called: None   Admission status: Inpatient    Bernadette Hoit MD [facility] Pager [phone]  If 7PM-7AM, please contact night-coverage [email or web address] Password [email or web address]  11/28/2019, 7:12 AM

## 2019-11-28 NOTE — ED Provider Notes (Signed)
  Provider Note MRN:  [id number]  Arrival date & time: 11/28/19    ED Course and Medical Decision Making  Assumed care from Dr. Eulis Foster at shift change.  Hypoxic respiratory failure, recent COVID-19 infection and discharged on 3 L nasal cannula, reportedly satting in the low 80s on 3 L.  Going in and out of SVT with heart rate in the 200s here in the emergency department.  Soft blood pressure.  Will obtain CTA to exclude PE.  CTA without PE, diffuse airspace disease, suspect Covid versus secondary bacterial pneumonia.  Patient has spiked a fever, will cover with ceftriaxone and azithromycin.  Admitted to hospital service for further care.  Patient requiring 20+ liters high flow nasal cannula.  Ultrasound ED Peripheral IV (Provider)  Date/Time: 11/28/2019 12:36 AM Performed by: Maudie Flakes, MD Authorized by: Maudie Flakes, MD   Procedure details:    Indications: multiple failed IV attempts     Skin Prep: chlorhexidine gluconate     Location:  Right AC   Angiocath:  20 G   Bedside Ultrasound Guided: Yes     Patient tolerated procedure without complications: Yes     Dressing applied: Yes   .Critical Care Performed by: Maudie Flakes, MD Authorized by: Maudie Flakes, MD   Critical care provider statement:    Critical care time (minutes):  34   Critical care was necessary to treat or prevent imminent or life-threatening deterioration of the following conditions: Hypoxic respiratory failure.   Critical care was time spent personally by me on the following activities:  Discussions with consultants, evaluation of patient's response to treatment, examination of patient, ordering and performing treatments and interventions, ordering and review of laboratory studies, ordering and review of radiographic studies, pulse oximetry, re-evaluation of patient's condition, obtaining history from patient or surrogate and review of old charts   I assumed direction of critical care for this patient  from another provider in my specialty: yes   Ultrasound ED Peripheral IV (Provider)  Date/Time: 11/28/2019 4:01 AM Performed by: Maudie Flakes, MD Authorized by: Maudie Flakes, MD   Procedure details:    Indications: multiple failed IV attempts     Skin Prep: chlorhexidine gluconate     Location:  Right forearm   Angiocath:  18 G   Bedside Ultrasound Guided: Yes     Patient tolerated procedure without complications: Yes     Dressing applied: Yes      Final Clinical Impressions(s) / ED Diagnoses     ICD-10-CM   1. COVID-19 virus infection  U07.1   2. SVT (supraventricular tachycardia) (HCC)  I47.1   3. Acute respiratory failure with hypoxia [facility])  J96.01     ED Discharge Orders    None      Discharge Instructions   None     Barth Kirks. [place] Small, [place] [email or web address]    Maudie Flakes, MD 11/28/19 [phone]

## 2019-11-28 NOTE — Progress Notes (Signed)
Tried to call RN x2 ,to inform that IV Vascular Team do not provide PICC service on weekends.

## 2019-11-28 NOTE — ED Notes (Signed)
Pt given meal tray and beverage.

## 2019-11-29 DIAGNOSIS — J9601 Acute respiratory failure with hypoxia: Secondary | ICD-10-CM | POA: Diagnosis not present

## 2019-11-29 LAB — FERRITIN: Ferritin: 1386 ng/mL — ABNORMAL HIGH (ref 11–307)

## 2019-11-29 LAB — URINALYSIS, ROUTINE W REFLEX MICROSCOPIC
Bilirubin Urine: NEGATIVE
Glucose, UA: NEGATIVE mg/dL
Hgb urine dipstick: NEGATIVE
Ketones, ur: NEGATIVE mg/dL
Nitrite: NEGATIVE
Protein, ur: NEGATIVE mg/dL
Specific Gravity, Urine: 1.04 — ABNORMAL HIGH (ref 1.005–1.030)
pH: 5 (ref 5.0–8.0)

## 2019-11-29 LAB — CBC WITH DIFFERENTIAL/PLATELET
Abs Immature Granulocytes: 0.16 10*3/uL — ABNORMAL HIGH (ref 0.00–0.07)
Basophils Absolute: 0 10*3/uL (ref 0.0–0.1)
Basophils Relative: 0 %
Eosinophils Absolute: 0 10*3/uL (ref 0.0–0.5)
Eosinophils Relative: 0 %
HCT: 27 % — ABNORMAL LOW (ref 36.0–46.0)
Hemoglobin: 9.1 g/dL — ABNORMAL LOW (ref 12.0–15.0)
Immature Granulocytes: 2 %
Lymphocytes Relative: 2 %
Lymphs Abs: 0.2 10*3/uL — ABNORMAL LOW (ref 0.7–4.0)
MCH: 30.7 pg (ref 26.0–34.0)
MCHC: 33.7 g/dL (ref 30.0–36.0)
MCV: 91.2 fL (ref 80.0–100.0)
Monocytes Absolute: 0.2 10*3/uL (ref 0.1–1.0)
Monocytes Relative: 3 %
Neutro Abs: 7.6 10*3/uL (ref 1.7–7.7)
Neutrophils Relative %: 93 %
Platelets: 257 10*3/uL (ref 150–400)
RBC: 2.96 MIL/uL — ABNORMAL LOW (ref 3.87–5.11)
RDW: 13.5 % (ref 11.5–15.5)
WBC: 8.2 10*3/uL (ref 4.0–10.5)
nRBC: 0 % (ref 0.0–0.2)

## 2019-11-29 LAB — PROTIME-INR
INR: 2.1 — ABNORMAL HIGH (ref 0.8–1.2)
Prothrombin Time: 23.1 seconds — ABNORMAL HIGH (ref 11.4–15.2)

## 2019-11-29 LAB — COMPREHENSIVE METABOLIC PANEL
ALT: 47 U/L — ABNORMAL HIGH (ref 0–44)
AST: 35 U/L (ref 15–41)
Albumin: 1.9 g/dL — ABNORMAL LOW (ref 3.5–5.0)
Alkaline Phosphatase: 58 U/L (ref 38–126)
Anion gap: 12 (ref 5–15)
BUN: 16 mg/dL (ref 6–20)
CO2: 24 mmol/L (ref 22–32)
Calcium: 7 mg/dL — ABNORMAL LOW (ref 8.9–10.3)
Chloride: 103 mmol/L (ref 98–111)
Creatinine, Ser: 0.44 mg/dL (ref 0.44–1.00)
GFR, Estimated: >60 mL/min (ref >60)
Glucose, Bld: 204 mg/dL — ABNORMAL HIGH (ref 70–99)
Potassium: 3.1 mmol/L — ABNORMAL LOW (ref 3.5–5.1)
Sodium: 139 mmol/L (ref 135–145)
Total Bilirubin: 0.6 mg/dL (ref 0.3–1.2)
Total Protein: 5.3 g/dL — ABNORMAL LOW (ref 6.5–8.1)

## 2019-11-29 LAB — C-REACTIVE PROTEIN: CRP: 13 mg/dL — ABNORMAL HIGH (ref <1.0)

## 2019-11-29 LAB — PHOSPHORUS: Phosphorus: 2.3 mg/dL — ABNORMAL LOW (ref 2.5–4.6)

## 2019-11-29 LAB — MAGNESIUM: Magnesium: 2 mg/dL (ref 1.7–2.4)

## 2019-11-29 LAB — PROCALCITONIN: Procalcitonin: 0.13 ng/mL

## 2019-11-29 LAB — APTT: aPTT: 62 seconds — ABNORMAL HIGH (ref 24–36)

## 2019-11-29 MED ORDER — ZOLPIDEM TARTRATE 5 MG PO TABS
5.0000 mg | ORAL_TABLET | Freq: Once | ORAL | Status: AC
  Administered 2019-11-29: 5 mg via ORAL
  Filled 2019-11-29: qty 1

## 2019-11-29 MED ORDER — POTASSIUM CHLORIDE CRYS ER 20 MEQ PO TBCR
40.0000 meq | EXTENDED_RELEASE_TABLET | ORAL | Status: AC
  Administered 2019-11-29: 40 meq via ORAL
  Filled 2019-11-29: qty 2

## 2019-11-29 MED ORDER — PROMETHAZINE HCL 25 MG/ML IJ SOLN
12.5000 mg | Freq: Once | INTRAMUSCULAR | Status: AC
  Administered 2019-11-29: 12.5 mg via INTRAVENOUS
  Filled 2019-11-29: qty 1

## 2019-11-29 MED ORDER — WARFARIN SODIUM 7.5 MG PO TABS
7.5000 mg | ORAL_TABLET | ORAL | Status: AC
  Administered 2019-11-29: 7.5 mg via ORAL
  Filled 2019-11-29 (×2): qty 1

## 2019-11-29 MED ORDER — K PHOS MONO-SOD PHOS DI & MONO 155-852-130 MG PO TABS
250.0000 mg | ORAL_TABLET | Freq: Three times a day (TID) | ORAL | Status: AC
  Administered 2019-11-29 – 2019-11-30 (×5): 250 mg via ORAL
  Filled 2019-11-29 (×7): qty 1

## 2019-11-29 NOTE — ED Notes (Signed)
Report given to Victoria  

## 2019-11-29 NOTE — ED Notes (Signed)
Assumed care of pt

## 2019-11-29 NOTE — Progress Notes (Signed)
Patient Demographics:    [name], is a 49 y.o. female, DOB - 12-Aug-1970, NIO:[id number]  Admit date - 12/15/2019   Admitting Physician Bernadette Hoit, DO  Outpatient Primary MD for the patient is Sharilyn Sites, MD  LOS - 1  Chief Complaint  Patient presents with  . Shortness of Breath        Subjective:    [name] today has   No chest pain,     Nausea persist but no emesis No abd pain , no diarrhea -No further fevers  -Had nosebleeds -Requiring 4 L of oxygen via nasal cannula -Cough and dyspnea persist   Assessment  & Plan :    Principal Problem:   Acute respiratory failure with hypoxia (HCC) Active Problems:   Anticoagulation goal of INR 2.5 to 3.5   Pneumonia due to COVID-19 virus   History of mitral valve replacement with mechanical valve   Systemic lupus erythematosus (HCC)   Gastroparesis   Primary hypothyroidism   Hypokalemia   Hypophosphatemia   Transaminitis   Hypoalbuminemia   Acute respiratory disease due to COVID-19 virus   Brief Summary:- 50 y.o.femalewith medical history significant forSLE, rheumatoid arthritis, Raynaud's, hypothyroidism, and gastroparesis with persistent nausea as well as mitral valve replacement on chronic anticoagulation and recent COVID-19 infection/pneumonia --- recently discharged home with home O2 on 11/26/19 ---Readmitted on 11/28/2019 with worsening hypoxia and dyspnea and significant tachycardia and tachypnea---   A/p 1)Acute respiratory failuredue to progression of Covid pneumonia-readmitted on 11/28/2019 with worsening hypoxia dyspnea and significant tachycardia and tachypnea --despite completing treatment with steroids and remdesivir. -CTA chest on 11/28/2019 with bilateral groundglassopacities consistent with Covid pneumonia along with elevated pulmonary artery pressures but no evidence of PE -Currently requiring 4 L of oxygen  via nasal cannula -Procalcitonin is up to 0.34 >> 0.13 -Patient developed new onset fevers on 11/28/2019 -We will treat empirically with IV ceftriaxone and azithromycin for possible superimposed bacterial infection--pending culture results -COVID-19 admission protocol -Mucolytic's, inhalers as needed -Patient has already completed course of remdesivir on recent hospitalization -Initially required high flow oxygen now weaned down to 3 L of oxygen via nasal cannula -Continue bronchodilators and mucolytics COVID-19 Labs  Recent Labs    11/29/19 0428  FERRITIN 1,386*  CRP 13.0*    Lab Results  Component Value Date   SARSCOV2NAA POSITIVE (A) 11/09/2019   [place] Not Detected 03/10/2019     Rheumatoid arthritis, SLE, immunosuppression-stable. - Hold rituximab -As she has been off her immunosuppressants for a while, patient should discuss with her rheumatologist on discharge if she can get the Covid vaccine in the near future  Mitral valve replacement with mechanical valve, supratherapeutic INR Her anticoagulation goal INR is 2.5-3.5.  Hemoglobin is stable no sign of bleeding. -INR -was recently supratherapeutic, currently down to 2.1 -Patient had an episode of nosebleed on 11/29/2019 -Pharmacy to manage Coumadin  Acute on chronic anemia--hemoglobin is down to 9.1 from a baseline around 11- -patient is an episode of nosebleed on 11/29/2019 -Monitor closely  -Possible UTI--- UA from 11/29/2019 suspicious for UTI, continue IV Rocephin pending culture data  Gastroparesis Continue home metoclopramide, Prn Phenergan  Hypothyroidism Continue home Synthroid  -Hypokalemia and hypophosphatemia--- replace and recheck  SVT/tachyarrhythmia--- in the setting of electrolyte  derangement and persistent profound hypoxia--- appears to have resolved  Elevated transaminitis AST 58>> 35 ALT 71>>58 Continue to monitor liver panel  Hypoalbuminemia possibly secondary to moderate  protein calorie malnutrition Albumin 2.9; this is possibly due to decreased appetite secondary to Covid 19 pneumonia Dietary  Consult -Continue nutritional supplements    Code Status:Full code Family Communication:Updated patient's daughter at patient's request, patient's daughter is an Therapist, sports at this facility Consults called:None   Disposition/Need for in-Hospital Stay- patient unable to be discharged at this time due to --- fevers and worsening hypoxia raising concern for possible superimposed bacterial pneumonia on underlying Covid pneumonia  and IV steroids and possible UTI requiring IV antibiotics pending further clinical and culture data--*  Status is: Inpatient  Remains inpatient appropriate because: fevers and worsening hypoxia raising concern for possible superimposed bacterial pneumonia on underlying Covid pneumonia  and IV steroids and possible UTI requiring IV antibiotics pending further clinical and culture data--*   Disposition: The patient is from: Home              Anticipated d/c is to: Home              Anticipated d/c date is: > 3 days              Patient currently is not medically stable to d/c. Barriers: Not Clinically Stable-  fevers and worsening hypoxia raising concern for possible superimposed bacterial pneumonia on underlying Covid pneumonia  and IV steroids and possible UTI requiring IV antibiotics pending further clinical and culture data--*  Consults  :  na  DVT Prophylaxis  : Coumadin- SCDs   Lab Results  Component Value Date   PLT 257 11/29/2019    Inpatient Medications  Scheduled Meds: . vitamin C  500 mg Oral Daily  . dextromethorphan-guaiFENesin  1 tablet Oral BID  . levothyroxine  75 mcg Oral QAC breakfast  . methylPREDNISolone (SOLU-MEDROL) injection  40 mg Intravenous Q12H  . metoCLOPramide  5 mg Oral BID  . multivitamin with minerals  1 tablet Oral Daily  . pantoprazole (PROTONIX) IV  40 mg Intravenous Q24H  . phosphorus  250 mg Oral  TID  . Warfarin - Pharmacist Dosing Inpatient   Does not apply q1600  . zinc sulfate  220 mg Oral Daily   Continuous Infusions: . azithromycin 500 mg (11/29/19 1649)  . cefTRIAXone (ROCEPHIN)  IV Stopped (11/29/19 0439)   PRN Meds:.ALPRAZolam, chlorpheniramine-HYDROcodone, ipratropium-albuterol, promethazine    Anti-infectives (From admission, onward)   Start     Dose/Rate Route Frequency Ordered Stop   11/29/19 0400  cefTRIAXone (ROCEPHIN) 2 g in sodium chloride 0.9 % 100 mL IVPB        2 g 200 mL/hr over 30 Minutes Intravenous Every 24 hours 11/28/19 1427     11/28/19 1500  azithromycin (ZITHROMAX) 500 mg in sodium chloride 0.9 % 250 mL IVPB        500 mg 250 mL/hr over 60 Minutes Intravenous Every 24 hours 11/28/19 1427     11/28/19 0500  azithromycin (ZITHROMAX) tablet 500 mg        500 mg Oral  Once 11/28/19 0451 11/28/19 0515   11/28/19 0300  cefTRIAXone (ROCEPHIN) 2 g in sodium chloride 0.9 % 100 mL IVPB        2 g 200 mL/hr over 30 Minutes Intravenous  Once 11/28/19 0249 11/28/19 0501   11/28/19 0300  azithromycin (ZITHROMAX) 500 mg in sodium chloride 0.9 % 250 mL IVPB  Status:  Discontinued        500 mg 250 mL/hr over 60 Minutes Intravenous Every 24 hours 11/28/19 0249 11/28/19 0451        Objective:   Vitals:   11/29/19 1330 11/29/19 1345 11/29/19 1400 11/29/19 1430  BP: 92/66  93/64 (!) 95/56  Pulse: 69  74 73  Resp: 20  (!) 30 (!) 29  Temp:      TempSrc:      SpO2: 98% 97% 97% (!) 89%    Wt Readings from Last 3 Encounters:  11/24/19 67.6 kg  11/09/19 69.4 kg  07/16/19 76.2 kg    No intake or output data in the 24 hours ending 11/29/19 1810   Physical Exam  Gen:- Awake Alert,  In no apparent distress  HEENT:- [place].AT, No sclera icterus Nose- [place] 4L/min , resolved nosebleed Neck-Supple Neck,, Lt  IJ central line Lungs-diminished breath sounds, scattered rhonchi  CV- S1, S2 normal, regular  Abd-  +ve B.Sounds, Abd Soft, No tenderness,      Extremity/Skin:- No  edema, pedal pulses present  Psych-affect is appropriate, oriented x3 Neuro-generalized weakness and deconditioning ,no new focal deficits, no tremors   Data Review:   Micro Results Recent Results (from the past 240 hour(s))  Culture, blood (Routine X 2) w Reflex to ID Panel     Status: None (Preliminary result)   Collection Time: 11/28/19 12:15 PM   Specimen: BLOOD RIGHT FOREARM  Result Value Ref Range Status   Specimen Description   Final    BLOOD RIGHT FOREARM BOTTLES DRAWN AEROBIC AND ANAEROBIC   Special Requests   Final    Blood Culture adequate volume Performed at [facility], [address] [place], [place] [address]    Culture PENDING  Incomplete   Report Status PENDING  Incomplete    Radiology Reports CT ABDOMEN PELVIS WO CONTRAST  Result Date: 11/28/2019 CLINICAL DATA:  Centralized abdomen pain and nausea for 1 week. Patient's JHERD-40 positive. EXAM: CT ABDOMEN AND PELVIS WITHOUT CONTRAST TECHNIQUE: Multidetector CT imaging of the abdomen and pelvis was performed following the standard protocol without IV contrast. COMPARISON:  November 28, 2016 FINDINGS: Lower chest: Extensive patchy consolidation is identified throughout bilateral lung bases. Hepatobiliary: No focal liver abnormality is seen. Status post cholecystectomy. No biliary dilatation. Pancreas: Pancreatic calcification is identified unchanged compared prior exam. There is no evidence of pancreatitis. No focal mass is identified in the pancreas. Spleen: Normal in size without focal abnormality. Adrenals/Urinary Tract: Bilateral adrenal glands are normal. Left kidney cyst is identified in the upper pole unchanged. Right kidney is normal. There is no hydronephrosis bilaterally. Contrast filled bladder is normal. Stomach/Bowel: Stomach is within normal limits. Surgical clips are identified near the cecum suggesting prior appendectomy. No evidence of bowel wall thickening, distention, or inflammatory  changes. Vascular/Lymphatic: Aortic atherosclerosis. No enlarged abdominal or pelvic lymph nodes. Reproductive: Uterus and bilateral adnexa are unremarkable. Other: No abdominal wall hernia or abnormality. No abdominopelvic ascites. Musculoskeletal: Degenerative joint changes of the spine are noted. IMPRESSION: 1. No acute abnormality identified in the abdomen and pelvis. 2. Extensive patchy consolidation is identified throughout bilateral lung bases consistent with pneumonia. 3. Aortic atherosclerosis. Aortic Atherosclerosis (ICD10-I70.0). Electronically Signed   By: Abelardo Diesel M.D.   On: 11/28/2019 09:19   DG Chest 1 View  Result Date: 11/28/2019 CLINICAL DATA:  Central line placement.  History of COVID-19. EXAM: CHEST  1 VIEW COMPARISON:  Chest radiograph 12/19/2019 FINDINGS: No central venous catheter tip is present with  tip projecting over the right atrium. Monitoring leads overlie the patient. Stable cardiac and mediastinal contours. Interval increase in bilateral mid lower lung airspace opacities. No pleural effusion or pneumothorax. Similar-appearing linear radiodensity projecting over the right lower hemithorax. IMPRESSION: 1. Central venous catheter tip projects over the right atrium. 2. Increasing bilateral mid and lower lung airspace opacities. Electronically Signed   By: Lovey Newcomer M.D.   On: 11/28/2019 14:18   CT Head Wo Contrast  Result Date: 11/09/2019 CLINICAL DATA:  Pain following recent fall EXAM: CT HEAD WITHOUT CONTRAST TECHNIQUE: Contiguous axial images were obtained from the base of the skull through the vertex without intravenous contrast. COMPARISON:  None. FINDINGS: Brain: The ventricles and sulci are normal in size and configuration. There is no intracranial mass, hemorrhage, extra-axial fluid collection, or midline shift. The brain parenchyma appears normal. There is no demonstrable acute infarct. Vascular: No hyperdense vessel. No appreciable vascular calcification. Skull:  The bony calvarium appears intact. Sinuses/Orbits: There is mucosal thickening in the maxillary antra bilaterally, more severe on the right than on the left. There is opacification of multiple ethmoid air cells. There is opacification in the frontal sinuses bilaterally. Orbits appear symmetric bilaterally. Other: The mastoid air cells are clear. IMPRESSION: Brain parenchyma appears unremarkable.  No mass or hemorrhage. Multifocal paranasal sinus disease noted. Electronically Signed   By: Lowella Grip III M.D.   On: 11/09/2019 09:39   CT Angio Chest PE W/Cm &/Or Wo Cm  Result Date: 11/28/2019 CLINICAL DATA:  COVID positive.  Shortness of breath. EXAM: CT ANGIOGRAPHY CHEST WITH CONTRAST TECHNIQUE: Multidetector CT imaging of the chest was performed using the standard protocol during bolus administration of intravenous contrast. Multiplanar CT image reconstructions and MIPs were obtained to evaluate the vascular anatomy. CONTRAST:  176mL OMNIPAQUE IOHEXOL 350 MG/ML SOLN COMPARISON:  January 30, 2013 FINDINGS: Cardiovascular: Examination is limited by respiratory motion artifact. Within that limitation, there is no central pulmonary embolus. The size of the main pulmonary artery is enlarged, measuring 3.6 cm. Heart size is normal, with no pericardial effusion. The course and caliber of the aorta are normal. There is no atherosclerotic calcification. Opacification decreased due to pulmonary arterial phase contrast bolus timing. Mediastinum/Nodes: -- No mediastinal lymphadenopathy. -- No hilar lymphadenopathy. -- No axillary lymphadenopathy. -- No supraclavicular lymphadenopathy. -- Normal thyroid gland where visualized. -the esophagus is fluid-filled to the level of the upper thorax. Lungs/Pleura: There are hazy bilateral ground-glass airspace opacities. There is no pneumothorax. There is a trace left-sided pleural effusion with adjacent atelectasis. Upper Abdomen: Contrast bolus timing is not optimized for  evaluation of the abdominal organs. The visualized portions of the organs of the upper abdomen are normal. Musculoskeletal: No chest wall abnormality. No bony spinal canal stenosis. Review of the MIP images confirms the above findings. IMPRESSION: 1. Examination is limited by respiratory motion artifact. Within that limitation, there is no central pulmonary embolus. 2. Bilateral ground-glass airspace opacities, which can be seen in patients with an atypical infectious process such as viral pneumonia. 3. Trace left-sided pleural effusion with adjacent atelectasis. 4. Dilated main pulmonary artery, which can be seen in patients with elevated pulmonary artery pressures. Electronically Signed   By: Constance Holster M.D.   On: 11/28/2019 02:29   DG Chest Port 1 View  Result Date: 11/26/2019 CLINICAL DATA:  Shortness of breath with hypoxia, history of COVID-19 positivity EXAM: PORTABLE CHEST 1 VIEW COMPARISON:  11/24/2019 FINDINGS: Cardiac shadow is stable. Bilateral airspace opacities are again identified and stable.  Small right-sided pleural effusion is noted which is new from the prior study. No bony abnormality is seen. IMPRESSION: Stable airspace opacities consistent with the given clinical history. Small right-sided pleural effusion. Electronically Signed   By: Inez Catalina M.D.   On: 12/15/2019 23:13   DG Chest Portable 1 View  Result Date: 11/24/2019 CLINICAL DATA:  Shortness of breath, history of breast cancer, recent COVID admission EXAM: PORTABLE CHEST 1 VIEW COMPARISON:  11/09/2019 FINDINGS: Bilateral pulmonary opacities are present, greatest in the lower left lung. This has increased since the prior study. No pleural effusion or pneumothorax. Stable cardiomediastinal contours with normal heart size. IMPRESSION: Bilateral pulmonary opacities, left greater than right. This is increased since 11/09/2019. Electronically Signed   By: Macy Mis M.D.   On: 11/24/2019 17:18   DG Chest Port 1  View  Result Date: 11/09/2019 CLINICAL DATA:  Cough and fever for the past week. EXAM: PORTABLE CHEST 1 VIEW COMPARISON:  Chest x-ray dated May 21, 2016. FINDINGS: The heart size and mediastinal contours are within normal limits. Normal pulmonary vascularity. Similar retained epicardial pacing wires overlying the right chest. New patchy opacities at the peripheral right lung base. No focal consolidation, pleural effusion, or pneumothorax. No acute osseous abnormality. IMPRESSION: 1. Right lower lobe pneumonia with appearance concerning for atypical infection, including viral pneumonia. Electronically Signed   By: Titus Dubin M.D.   On: 11/09/2019 09:04   [place] EKG SITE RITE  Result Date: 11/28/2019 If Site Rite image not attached, placement could not be confirmed due to current cardiac rhythm.    CBC Recent Labs  Lab 11/24/19 1636 11/25/19 0640 12/15/2019 2337 11/29/19 0428  WBC 6.3 4.3 9.1 8.2  HGB 12.5 10.9* 11.8* 9.1*  HCT 37.3 33.3* 35.4* 27.0*  PLT 324 303 322 257  MCV 90.3 90.5 91.0 91.2  MCH 30.3 29.6 30.3 30.7  MCHC 33.5 32.7 33.3 33.7  RDW 13.7 13.4 13.7 13.5  LYMPHSABS 0.3* 0.4* 0.5* 0.2*  MONOABS 0.2 0.2 0.2 0.2  EOSABS 0.0 0.0 0.1 0.0  BASOSABS 0.0 0.0 0.0 0.0    Chemistries  Recent Labs  Lab 11/24/19 1636 11/25/19 0640 12/14/2019 2337 11/29/19 0428  NA 134* 134* 135 139  K 3.9 3.8 3.4* 3.1*  CL 96* 98 96* 103  CO2 26 26 30 24   GLUCOSE 95 128* 97 204*  BUN 18 24* 15 16  CREATININE 0.91 0.70 0.72 0.44  CALCIUM 8.8* 8.4* 8.4* 7.0*  MG  --   --  2.0 2.0  AST  --  26 58* 35  ALT  --  23 71* 47*  ALKPHOS  --  66 90 58  BILITOT  --  0.7 0.9 0.6   ------------------------------------------------------------------------------------------------------------------ No results for input(s): CHOL, HDL, LDLCALC, TRIG, CHOLHDL, LDLDIRECT in the last 72 hours.  No results found for:  HGBA1C ------------------------------------------------------------------------------------------------------------------ No results for input(s): TSH, T4TOTAL, T3FREE, THYROIDAB in the last 72 hours.  Invalid input(s): FREET3 ------------------------------------------------------------------------------------------------------------------ Recent Labs    11/29/19 0428  FERRITIN 1,386*    Coagulation profile Recent Labs  Lab 11/24/19 1636 11/25/19 0640 11/26/19 0642 12/08/2019 2337 11/29/19 0428  INR 5.1* 6.7* 4.1* 2.2* 2.1*    No results for input(s): DDIMER in the last 72 hours.  Cardiac Enzymes No results for input(s): CKMB, TROPONINI, MYOGLOBIN in the last 168 hours.  Invalid input(s): CK ------------------------------------------------------------------------------------------------------------------    Component Value Date/Time   BNP 84.0 12/15/2019 2337    Roxan Hockey M.D on 11/29/2019 at 6:10 PM  Go to [email or web address] - for contact info  [facility] - Office  [phone]

## 2019-11-29 NOTE — Progress Notes (Addendum)
ANTICOAGULATION CONSULT NOTE -   Pharmacy Consult for warfarin dosing Indication:  Mechanical valve  Vital Signs: Temp: 97.7 F (36.5 C) (10/10 1030) Temp Source: Oral (10/10 1030) BP: 95/56 (10/10 1430) Pulse Rate: 73 (10/10 1430)  Labs: Recent Labs    12/06/2019 2337 11/28/19 0400 11/29/19 0428  HGB 11.8*  --  9.1*  HCT 35.4*  --  27.0*  PLT 322  --  257  APTT  --   --  62*  LABPROT 23.6*  --  23.1*  INR 2.2*  --  2.1*  CREATININE 0.72  --  0.44  TROPONINIHS 11 15  --     Estimated Creatinine Clearance: 78.7 mL/min (by C-G formula based on SCr of 0.44 mg/dL).    Assessment: Pharmacy consulted to dose warfarin  for this 49 yo female on chronic anti-coagulation therapy with warfarin for history of mechanical valve .  Patient was admitted with high INR of 4.1 on 10/7 and took her last dose of warfarin 2.5mg  on 11/26/19.  Patient is currently having nausea, possibly affecting Vit K intake.  Home warfarin dose:  5mg  on Sun/Mon/Wed/Fri and 7.5mg  on Tues/Thurs/Sat Dose recently reduced to 2.5mg  and several doses were held last week.  11/29/19 update INR: 2.1  Hb: 9.1 (baseline 11.8)      Goal of Therapy:  INR Goal: 2.5-3.5 Monitor platelets by anticoagulation protocol: Yes   Plan:   Give warfarin  7.5mg  x1 dose today  (50% boost) Daily INR and every other day CBC Monitor for signs and symptoms of bleeding.   Despina Pole 11/29/2019,4:26 PM

## 2019-11-30 ENCOUNTER — Other Ambulatory Visit: Payer: Self-pay

## 2019-11-30 DIAGNOSIS — J9601 Acute respiratory failure with hypoxia: Secondary | ICD-10-CM | POA: Diagnosis not present

## 2019-11-30 LAB — PHOSPHORUS: Phosphorus: 3.4 mg/dL (ref 2.5–4.6)

## 2019-11-30 LAB — COMPREHENSIVE METABOLIC PANEL
ALT: 63 U/L — ABNORMAL HIGH (ref 0–44)
AST: 39 U/L (ref 15–41)
Albumin: 2.4 g/dL — ABNORMAL LOW (ref 3.5–5.0)
Alkaline Phosphatase: 60 U/L (ref 38–126)
Anion gap: 10 (ref 5–15)
BUN: 16 mg/dL (ref 6–20)
CO2: 26 mmol/L (ref 22–32)
Calcium: 7.9 mg/dL — ABNORMAL LOW (ref 8.9–10.3)
Chloride: 98 mmol/L (ref 98–111)
Creatinine, Ser: 0.44 mg/dL (ref 0.44–1.00)
GFR, Estimated: >60 mL/min (ref >60)
Glucose, Bld: 114 mg/dL — ABNORMAL HIGH (ref 70–99)
Potassium: 3.6 mmol/L (ref 3.5–5.1)
Sodium: 134 mmol/L — ABNORMAL LOW (ref 135–145)
Total Bilirubin: 0.5 mg/dL (ref 0.3–1.2)
Total Protein: 5.5 g/dL — ABNORMAL LOW (ref 6.5–8.1)

## 2019-11-30 LAB — CBC WITH DIFFERENTIAL/PLATELET
Abs Immature Granulocytes: 0.15 10*3/uL — ABNORMAL HIGH (ref 0.00–0.07)
Basophils Absolute: 0 10*3/uL (ref 0.0–0.1)
Basophils Relative: 0 %
Eosinophils Absolute: 0 10*3/uL (ref 0.0–0.5)
Eosinophils Relative: 0 %
HCT: 28.9 % — ABNORMAL LOW (ref 36.0–46.0)
Hemoglobin: 9.6 g/dL — ABNORMAL LOW (ref 12.0–15.0)
Immature Granulocytes: 1 %
Lymphocytes Relative: 2 %
Lymphs Abs: 0.3 10*3/uL — ABNORMAL LOW (ref 0.7–4.0)
MCH: 30.5 pg (ref 26.0–34.0)
MCHC: 33.2 g/dL (ref 30.0–36.0)
MCV: 91.7 fL (ref 80.0–100.0)
Monocytes Absolute: 0.4 10*3/uL (ref 0.1–1.0)
Monocytes Relative: 3 %
Neutro Abs: 12.2 10*3/uL — ABNORMAL HIGH (ref 1.7–7.7)
Neutrophils Relative %: 94 %
Platelets: 339 10*3/uL (ref 150–400)
RBC: 3.15 MIL/uL — ABNORMAL LOW (ref 3.87–5.11)
RDW: 13.8 % (ref 11.5–15.5)
WBC: 13.1 10*3/uL — ABNORMAL HIGH (ref 4.0–10.5)
nRBC: 0 % (ref 0.0–0.2)

## 2019-11-30 LAB — MAGNESIUM: Magnesium: 2.3 mg/dL (ref 1.7–2.4)

## 2019-11-30 LAB — PROTIME-INR
INR: 3.2 — ABNORMAL HIGH (ref 0.8–1.2)
Prothrombin Time: 31.3 seconds — ABNORMAL HIGH (ref 11.4–15.2)

## 2019-11-30 LAB — GLUCOSE, CAPILLARY: Glucose-Capillary: 122 mg/dL — ABNORMAL HIGH (ref 70–99)

## 2019-11-30 LAB — C-REACTIVE PROTEIN: CRP: 4.5 mg/dL — ABNORMAL HIGH (ref <1.0)

## 2019-11-30 LAB — CK: Total CK: 56 U/L (ref 38–234)

## 2019-11-30 LAB — FERRITIN: Ferritin: 1164 ng/mL — ABNORMAL HIGH (ref 11–307)

## 2019-11-30 MED ORDER — METOCLOPRAMIDE HCL 10 MG PO TABS: 5.0000 mg | ORAL_TABLET | Freq: Three times a day (TID) | ORAL | Status: DC

## 2019-11-30 MED ORDER — PANTOPRAZOLE SODIUM 40 MG PO TBEC
40.0000 mg | DELAYED_RELEASE_TABLET | Freq: Every day | ORAL | Status: DC
  Administered 2019-11-30: 40 mg via ORAL
  Filled 2019-11-30: qty 1

## 2019-11-30 MED ORDER — SODIUM CHLORIDE 0.9 % IV SOLN
2.0000 g | INTRAVENOUS | Status: AC
  Administered 2019-12-01: 2 g via INTRAVENOUS
  Filled 2019-11-30: qty 20

## 2019-11-30 MED ORDER — FUROSEMIDE 40 MG PO TABS
20.0000 mg | ORAL_TABLET | Freq: Every day | ORAL | Status: DC
  Administered 2019-11-30 – 2019-12-06 (×7): 20 mg via ORAL
  Filled 2019-11-30 (×7): qty 1

## 2019-11-30 MED ORDER — CHLORHEXIDINE GLUCONATE CLOTH 2 % EX PADS
6.0000 | MEDICATED_PAD | Freq: Every day | CUTANEOUS | Status: DC
  Administered 2019-11-30 – 2019-12-06 (×7): 6 via TOPICAL

## 2019-11-30 MED ORDER — PROMETHAZINE HCL 25 MG/ML IJ SOLN
12.5000 mg | Freq: Three times a day (TID) | INTRAMUSCULAR | Status: DC | PRN
  Administered 2019-11-30 – 2019-12-01 (×3): 25 mg via INTRAVENOUS
  Administered 2019-12-01: 12.5 mg via INTRAVENOUS
  Administered 2019-12-02 – 2019-12-05 (×9): 25 mg via INTRAVENOUS
  Administered 2019-12-05: 12.5 mg via INTRAVENOUS
  Administered 2019-12-05 – 2019-12-06 (×3): 25 mg via INTRAVENOUS
  Filled 2019-11-30 (×18): qty 1

## 2019-11-30 MED ORDER — SUCRALFATE 1 G PO TABS
1.0000 g | ORAL_TABLET | Freq: Three times a day (TID) | ORAL | Status: DC
  Administered 2019-11-30 – 2019-12-06 (×24): 1 g via ORAL
  Filled 2019-11-30 (×29): qty 1

## 2019-11-30 MED ORDER — ALBUTEROL SULFATE HFA 108 (90 BASE) MCG/ACT IN AERS
2.0000 | INHALATION_SPRAY | Freq: Four times a day (QID) | RESPIRATORY_TRACT | Status: DC
  Administered 2019-11-30: 2 via RESPIRATORY_TRACT
  Filled 2019-11-30: qty 6.7

## 2019-11-30 MED ORDER — ZOLPIDEM TARTRATE 5 MG PO TABS: 5.0000 mg | ORAL_TABLET | Freq: Every evening | ORAL | Status: DC | PRN

## 2019-11-30 MED ORDER — ACETAMINOPHEN 325 MG PO TABS
650.0000 mg | ORAL_TABLET | Freq: Four times a day (QID) | ORAL | Status: DC | PRN
  Administered 2019-11-30 – 2019-12-01 (×3): 650 mg via ORAL
  Filled 2019-11-30 (×4): qty 2

## 2019-11-30 MED ORDER — ALBUTEROL SULFATE HFA 108 (90 BASE) MCG/ACT IN AERS
2.0000 | INHALATION_SPRAY | Freq: Four times a day (QID) | RESPIRATORY_TRACT | Status: DC
  Administered 2019-11-30 – 2019-12-03 (×10): 2 via RESPIRATORY_TRACT

## 2019-11-30 MED ORDER — ALBUTEROL SULFATE HFA 108 (90 BASE) MCG/ACT IN AERS: 2.0000 | INHALATION_SPRAY | Freq: Four times a day (QID) | RESPIRATORY_TRACT | Status: DC

## 2019-11-30 MED ORDER — WARFARIN SODIUM 5 MG PO TABS
5.0000 mg | ORAL_TABLET | Freq: Once | ORAL | Status: AC
  Administered 2019-11-30: 5 mg via ORAL
  Filled 2019-11-30: qty 1

## 2019-11-30 MED ORDER — METOCLOPRAMIDE HCL 10 MG PO TABS
10.0000 mg | ORAL_TABLET | Freq: Three times a day (TID) | ORAL | Status: DC
  Administered 2019-11-30 – 2019-12-06 (×18): 10 mg via ORAL
  Filled 2019-11-30 (×20): qty 1

## 2019-11-30 MED ORDER — ENSURE ENLIVE PO LIQD
237.0000 mL | Freq: Three times a day (TID) | ORAL | Status: DC
  Administered 2019-12-01 – 2019-12-06 (×4): 237 mL via ORAL

## 2019-11-30 MED ORDER — ZOLPIDEM TARTRATE 5 MG PO TABS
5.0000 mg | ORAL_TABLET | Freq: Every day | ORAL | Status: DC
  Administered 2019-11-30 – 2019-12-03 (×4): 5 mg via ORAL
  Filled 2019-11-30 (×4): qty 1

## 2019-11-30 NOTE — Plan of Care (Signed)

## 2019-11-30 NOTE — Progress Notes (Signed)
ANTICOAGULATION CONSULT NOTE -   Pharmacy Consult for:  warfarin dosing Indication:  Mechanical valve  Vital Signs: Temp: 98.1 F (36.7 C) (10/11 0507) BP: 98/65 (10/11 0507) Pulse Rate: 73 (10/11 0507)  Labs: Recent Labs    11/30/2019 2337 12/16/2019 2337 11/28/19 0400 11/29/19 0428 11/30/19 0733  HGB 11.8*   < >  --  9.1* 9.6*  HCT 35.4*  --   --  27.0* 28.9*  PLT 322  --   --  257 339  APTT  --   --   --  62*  --   LABPROT 23.6*  --   --  23.1* 31.3*  INR 2.2*  --   --  2.1* 3.2*  CREATININE 0.72  --   --  0.44 0.44  CKTOTAL  --   --   --   --  56  TROPONINIHS 11  --  15  --   --    < > = values in this interval not displayed.    Estimated Creatinine Clearance: 77.8 mL/min (by C-G formula based on SCr of 0.44 mg/dL).    Assessment: Pharmacy consulted to dose warfarin  for this 49 yo female on chronic anti-coagulation therapy with warfarin for history of mechanical valve .  Patient was admitted with high INR of 4.1 on 10/7 and took her last dose of warfarin 2.5mg  on 11/26/19.  Patient is currently having nausea, possibly affecting Vit K intake.  Home warfarin dose:  5mg  on Sun/Mon/Wed/Fri and 7.5mg  on Tues/Thurs/Sat Dose recently reduced to 2.5mg  and several doses were held last week.  11/30/19 update INR: 3.2 -->within goal range Hb: 9.6 (baseline 11.8)        Goal of Therapy:  INR Goal: 2.5-3.5 Monitor platelets by anticoagulation protocol: Yes   Plan:   Give warfarin  5mg   today  x1 dose (usual home dose) Daily INR and every other day CBC Monitor for signs and symptoms of bleeding.   Despina Pole 11/30/2019,12:51 PM

## 2019-11-30 NOTE — Progress Notes (Signed)
Initial Nutrition Assessment  DOCUMENTATION CODES:   Severe malnutrition in context of acute illness/injury  INTERVENTION:  Ensure Enlive po TID, each supplement provides 350 kcal and 20 grams of protein (chocolate, requested cold)  Continue MVI with minerals daily  Encouraged po intake of meals and supplements  Pt at risk for refeeding given reported poor po intake, significant wt loss, and electrolyte abnormalities per labs. Recommend monitoring K, Mg, and P daily as po intake improves   NUTRITION DIAGNOSIS:   Severe Malnutrition related to acute illness (COVID-19 virus infection) as evidenced by per patient/family report, energy intake < or equal to 50% for > or equal to 5 days, percent weight loss.  GOAL:   Patient will meet greater than or equal to 90% of their needs    MONITOR:   PO intake, Supplement acceptance, Weight trends, Labs, I & O's, Skin  REASON FOR ASSESSMENT:   Malnutrition Screening Tool, Consult Assessment of nutrition requirement/status  ASSESSMENT:  RD working remotely.  49 year old female with history of SLE on rituximab, RA, Raynaud's, mitral valve replacement on chronic anticoagulation and hypothyroidism presented with 1 day onset of worsening shortness of breath after recent admissions from 9/20-9/22 as well as  10/5-10/7 due to acute respiratory failure with hypoxia secondary to progression of Covid pneumonia.  RD spoke with pt via phone this morning, says she feels slightly better today. Breakfast tray in room, waiting on medications before eating. Endorses  decreased appetite and limited poor po intake over the past month secondary to nausea, shortness of breath and coughing. Pt reports MD increasing dosage of nausea medication today. She reports history of gastroparesis with intermittent nausea and decreased po intake at home. Per chart, weights have trended down ~21 lbs (12.5%) in the last 4 months; significant. Given recent trends and 1 month  history of poor po, pt meets criteria for severe acute malnutrition. RD educated on catabolic nature of [id number] and the importance of adequate nutrition, she is agreeable to drinking Ensure supplements.   Medications reviewed and include: Vitamin C, Methylprednisolone, Reglan, MVI, K Phos, Warfarin, Zithromax, Rocephin Labs: K 3.1 (L), P 2.3 (L), Mg 2.0 (WNL)   NUTRITION - FOCUSED PHYSICAL EXAM: Unable to complete at this time, RD working remotely.   Diet Order:   Diet Order            Diet Heart Room service appropriate? Yes; Fluid consistency: Thin  Diet effective now                 EDUCATION NEEDS:   Education needs have been addressed  Skin:  Skin Assessment: Reviewed RN Assessment  Last BM:  10/9  Height:   Ht Readings from Last 1 Encounters:  11/30/19 5\' 4"  (1.626 m)    Weight:   Wt Readings from Last 1 Encounters:  11/30/19 66 kg    Ideal Body Weight:  54.5 kg  BMI:  Body mass index is 24.98 kg/m.  Estimated Nutritional Needs:   Kcal:  1930-2310  Protein:  100-112  Fluid:  >/= 1.9 L/day   Lajuan Lines, RD, LDN Clinical Nutrition After Hours/Weekend Pager # in [place]

## 2019-11-30 NOTE — Progress Notes (Signed)
Patient and daughter state that per Dr. Joesph Fillers, daughter [name] (works in our [employer]) is allowed to visit patient because she has been fitted for and N-95 and has been vaccinated.  Also tonight pt requested pain meds for upper GI pain from gastroparesis. MD paged x2 and spoke once with MD, no orders for pain meds received. MD ordered no narcotics be given r/t pt's low BP and hx of gastroparesis. MD did order one extra dose of 12.5mg  phenergan IV after RN relayed pt's request for dose to be changed to 25mg  because that was the dose she was taking when she was here last week.  Additionally pt wanted to know why she was not placed in a regular room because it's been over 21 days since she first tested positive for Covid. Verified with charge nurse and explained to patient that because pt is still symptomatic (coughing and on 6L [place]) she could still be shedding the virus and needs to remain on airborne precautions.

## 2019-11-30 NOTE — TOC Initial Note (Signed)
Transition of Care [facility]) - Initial/Assessment Note    Patient Details  Name: [name] MRN: [id number] Date of Birth: 1970-07-10  [facility] of [facility]) CM/SW Contact:    Ihor Gully, LCSW Phone Number: 11/30/2019, 4:39 PM  Clinical Narrative:                 Patient is a readmit d/c 10/7, set up on home oxygen. COVID+ TOC will continue to follow for needs.       Patient Goals and CMS Choice        Expected Discharge Plan and Services                                                Prior Living Arrangements/Services                       Activities of Daily Living Home Assistive Devices/Equipment: None ADL Screening (condition at time of admission) Patient's cognitive ability adequate to safely complete daily activities?: Yes Is the patient deaf or have difficulty hearing?: No Does the patient have difficulty seeing, even when wearing glasses/contacts?: No Does the patient have difficulty concentrating, remembering, or making decisions?: No Patient able to express need for assistance with ADLs?: Yes Does the patient have difficulty dressing or bathing?: No Independently performs ADLs?: Yes (appropriate for developmental age) Does the patient have difficulty walking or climbing stairs?: No Weakness of Legs: None Weakness of Arms/Hands: None  Permission Sought/Granted                  Emotional Assessment              Admission diagnosis:  SVT (supraventricular tachycardia) ([place]) [I47.1] Acute respiratory failure with hypoxia ([place]) [J96.01] Fever, unspecified fever cause [R50.9] COVID-19 virus infection [U07.1] Patient Active Problem List   Diagnosis Date Noted  . Hypophosphatemia 11/28/2019  . Transaminitis 11/28/2019  . Hypoalbuminemia 11/28/2019  . Acute respiratory disease due to COVID-19 virus 11/28/2019  . Acute respiratory failure with hypoxia ([place]) 11/24/2019  . Immunosuppression ([place])   . Pneumonia due to  COVID-19 virus 11/09/2019  . Hypokalemia 11/09/2019  . HSIL (high grade squamous intraepithelial lesion) on Pap smear of cervix 01/14/2017  . Encounter for gynecological examination with Papanicolaou smear of cervix 01/08/2017  . At risk for hemorrhage associated with liver biopsy 11/28/2016  . Elevated LFTs 10/04/2016  . Appendicitis with peritoneal abscess 05/23/2016  . Sepsis, unspecified organism ([place]) 05/22/2016  . AKI (acute kidney injury) ([place]) 05/22/2016  . Abdominal pain 05/21/2016  . Appendicitis with abscess 05/21/2016  . Volume depletion 05/21/2016  . History of breast cancer 05/21/2016  . Vaginal discharge 11/14/2015  . Vaginal burning 11/14/2015  . BV (bacterial vaginosis) 11/14/2015  . Abnormal Papanicolaou smear of cervix with positive human papilloma virus (HPV) test 08/17/2015  . History of abnormal cervical Pap smear 08/12/2015  . Primary hypothyroidism 12/07/2014  . Primary hypercoagulable state ([place]) 12/14/2013  . Encounter for monitoring cardiotoxic drug therapy 11/23/2013  . Gastroparesis 08/13/2013  . Screening for colorectal cancer 03/18/2013  . Dyspepsia 03/12/2013  . Dyspnea 11/14/2012  . Anticoagulation goal of INR 2.5 to 3.5   . Peripheral neuropathy   . B12 deficiency 08/21/2010  . 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 BRCA1 negative 08/16/2010  . BRCA2 negative  08/16/2010  . Chronic anticoagulation 05/17/2010  . Malignant neoplasm of upper outer quadrant of female breast ([place]) 02/24/2010  . History of mitral valve replacement with mechanical valve 11/10/2009  . Systemic lupus erythematosus ([place]) 11/10/2009   PCP:  Sharilyn Sites, MD Pharmacy:   [facility] [place] [address] Phone: [phone] Fax: [phone]  [place], [place] - [place] [address] [place] [address] [place] [address] Phone: [phone] Fax:  [phone]     Social Determinants of Health (SDOH) Interventions    Readmission Risk Interventions No flowsheet data found.

## 2019-11-30 NOTE — Progress Notes (Signed)
Patient Demographics:    [name], is a 49 y.o. female, DOB - March 17, 1970, JZP:[id number]  Admit date - 12/01/2019   Admitting Physician Bernadette Hoit, DO  Outpatient Primary MD for the patient is Sharilyn Sites, MD  LOS - 2  Chief Complaint  Patient presents with  . Shortness of Breath        Subjective:    [name] today has   No chest pain,     Patient's daughter [name] at bedside--discussed with patient and daughter at bedside, questions answered  -Patient complains of heartburn, persistent cough and dyspnea -Currently requiring 6 L of oxygen via nasal cannula -No further nosebleeds   Assessment  & Plan :    Principal Problem:   Acute respiratory failure with hypoxia (HCC) Active Problems:   Anticoagulation goal of INR 2.5 to 3.5   Pneumonia due to COVID-19 virus   History of mitral valve replacement with mechanical valve   Systemic lupus erythematosus ([place])   Gastroparesis   Primary hypothyroidism   Hypokalemia   Hypophosphatemia   Transaminitis   Hypoalbuminemia   Acute respiratory disease due to COVID-19 virus   Brief Summary:- 49 y.o.femalewith medical history significant forSLE, rheumatoid arthritis, Raynaud's, hypothyroidism, and gastroparesis with persistent nausea as well as mitral valve replacement on chronic anticoagulation and recent COVID-19 infection/pneumonia --- recently discharged home with home O2 on 11/26/19 ---Readmitted on 11/28/2019 with worsening hypoxia and dyspnea and significant tachycardia and tachypnea---   A/p 1)Acute respiratory failuredue to progression of Covid pneumonia-readmitted on 11/28/2019 with worsening hypoxia dyspnea and significant tachycardia and tachypnea --despite completing treatment with steroids and remdesivir. -CTA chest on 11/28/2019 with bilateral groundglassopacities consistent with Covid pneumonia along with elevated  pulmonary artery pressures but no evidence of PE -Currently requiring 6 L of oxygen via nasal cannula -Procalcitonin is up to 0.34 >> 0.13 -COVID-19 admission protocol -Mucolytic's, inhalers as needed -Patient has already completed course of remdesivir on recent hospitalization -Continue bronchodilators and mucolytics COVID-19 Labs  Recent Labs    11/29/19 0428  FERRITIN 1,386*  CRP 13.0*    Lab Results  Component Value Date   SARSCOV2NAA POSITIVE (A) 11/09/2019   [place] Not Detected 03/10/2019     Rheumatoid arthritis, SLE, immunosuppression-stable. - Hold rituximab -As she has been off her immunosuppressants for a while, patient should discuss with her rheumatologist on discharge if she can get the Covid vaccine in the near future  Mitral valve replacement with mechanical valve, supratherapeutic INR Her anticoagulation goal INR is 2.5-3.5.  Hemoglobin is stable no sign of bleeding. -INR -was recently supratherapeutic, currently 3.2 -Patient had an episode of nosebleed on 11/29/2019 -Pharmacy to manage Coumadin  Acute on chronic anemia--hemoglobin is down to 9.6 from a baseline around 11- -patient is an episode of nosebleed on 11/29/2019 -Monitor closely  -Possible UTI--- UA from 11/29/2019 suspicious for UTI, urine culture was not obtained, okay to treat empirically with IV Rocephin for total of 3 days -Patient had fever on presentation on 11/28/2019, no further fevers  Gastroparesis Complains of increasing nausea and heartburn okay to increase Reglan, may use as needed Phenergan --Suspect GI symptoms are worsened due to steroid therapy -Continue Protonix, add Carafate  Hypothyroidism Continue home Synthroid  -Hypokalemia and hypophosphatemia--- replace and  recheck  SVT/tachyarrhythmia--- tachycardia on admission, since resolved --it was in the setting of electrolyte derangement and persistent profound hypoxia---   Elevated transaminitis AST 58>>  35>>39 ALT 71>>47>>63 Continue to monitor liver panel  Hypoalbuminemia possibly secondary to moderate protein calorie malnutrition Albumin 2.4; this is possibly due to decreased appetite secondary to Covid 19 pneumonia Dietary  Consult -Continue nutritional supplements    Code Status:Full code Family Communication:Updated patient's daughter at patient's request, patient's daughter is an Therapist, sports at this facility Consults called:None   Disposition/Need for in-Hospital Stay- patient unable to be discharged at this time due to --- fevers and worsening hypoxia raising concern for possible  bacterial pneumonia superimposed on underlying Covid pneumonia  and IV steroids and possible UTI requiring IV antibiotics pending further clinical and culture data--*  Status is: Inpatient  Remains inpatient appropriate because: fevers and worsening hypoxia raising concern for possible  bacterial pneumonia superimposed on underlying Covid pneumonia  and IV steroids and possible UTI requiring IV antibiotics pending further clinical and culture data--*   Disposition: The patient is from: Home              Anticipated d/c is to: Home              Anticipated d/c date is: > 3 days              Patient currently is not medically stable to d/c. Barriers: Not Clinically Stable-   fevers and worsening hypoxia raising concern for possible  bacterial pneumonia superimposed on underlying Covid pneumonia  and IV steroids and possible UTI requiring IV antibiotics pending further clinical and culture data--*  Consults  :  na  DVT Prophylaxis  : Coumadin- SCDs   Lab Results  Component Value Date   PLT 257 11/29/2019    Inpatient Medications  Scheduled Meds: . vitamin C  500 mg Oral Daily  . Chlorhexidine Gluconate Cloth  6 each Topical Daily  . dextromethorphan-guaiFENesin  1 tablet Oral BID  . levothyroxine  75 mcg Oral QAC breakfast  . methylPREDNISolone (SOLU-MEDROL) injection  40 mg Intravenous Q12H    . metoCLOPramide  5 mg Oral BID  . multivitamin with minerals  1 tablet Oral Daily  . pantoprazole (PROTONIX) IV  40 mg Intravenous Q24H  . phosphorus  250 mg Oral TID  . Warfarin - Pharmacist Dosing Inpatient   Does not apply q1600  . zinc sulfate  220 mg Oral Daily   Continuous Infusions: . azithromycin 500 mg (11/29/19 1649)  . cefTRIAXone (ROCEPHIN)  IV 2 g (11/30/19 0436)   PRN Meds:.acetaminophen, ALPRAZolam, chlorpheniramine-HYDROcodone, ipratropium-albuterol, promethazine    Anti-infectives (From admission, onward)   Start     Dose/Rate Route Frequency Ordered Stop   11/29/19 0400  cefTRIAXone (ROCEPHIN) 2 g in sodium chloride 0.9 % 100 mL IVPB        2 g 200 mL/hr over 30 Minutes Intravenous Every 24 hours 11/28/19 1427     11/28/19 1500  azithromycin (ZITHROMAX) 500 mg in sodium chloride 0.9 % 250 mL IVPB        500 mg 250 mL/hr over 60 Minutes Intravenous Every 24 hours 11/28/19 1427     11/28/19 0500  azithromycin (ZITHROMAX) tablet 500 mg        500 mg Oral  Once 11/28/19 0451 11/28/19 0515   11/28/19 0300  cefTRIAXone (ROCEPHIN) 2 g in sodium chloride 0.9 % 100 mL IVPB        2 g 200 mL/hr  over 30 Minutes Intravenous  Once 11/28/19 0249 11/28/19 0501   11/28/19 0300  azithromycin (ZITHROMAX) 500 mg in sodium chloride 0.9 % 250 mL IVPB  Status:  Discontinued        500 mg 250 mL/hr over 60 Minutes Intravenous Every 24 hours 11/28/19 0249 11/28/19 0451        Objective:   Vitals:   11/29/19 2108 11/30/19 0008 11/30/19 0135 11/30/19 0507  BP: 99/77  92/61 98/65  Pulse: 67  72 73  Resp: 16  18 19   Temp: 97.8 F (36.6 C)  97.9 F (36.6 C) 98.1 F (36.7 C)  TempSrc:      SpO2: 97%  (!) 89% (!) 64%  Weight:  66 kg    Height:  5\' 4"  (1.626 m)      Wt Readings from Last 3 Encounters:  11/30/19 66 kg  11/24/19 67.6 kg  11/09/19 69.4 kg     Intake/Output Summary (Last 24 hours) at 11/30/2019 0825 Last data filed at 11/29/2019 2000 Gross per 24 hour   Intake 250 ml  Output --  Net 250 ml     Physical Exam  Gen:- Awake Alert,  In no apparent distress  HEENT:- [place].AT, No sclera icterus Nose- [place] 6L/min , resolved nosebleed Neck-Supple Neck,, Lt  IJ central line Lungs-diminished breath sounds, scattered rhonchi  CV- S1, S2 normal, regular  Abd-  +ve B.Sounds, Abd Soft, epigastric discomfort, no rebound    Extremity/Skin:- No  edema, pedal pulses present  Psych-affect is appropriate, oriented x3 Neuro-generalized weakness and deconditioning ,no new focal deficits, no tremors   Data Review:   Micro Results Recent Results (from the past 240 hour(s))  Culture, blood (Routine X 2) w Reflex to ID Panel     Status: None (Preliminary result)   Collection Time: 11/28/19 12:15 PM   Specimen: BLOOD RIGHT FOREARM  Result Value Ref Range Status   Specimen Description   Final    BLOOD RIGHT FOREARM BOTTLES DRAWN AEROBIC AND ANAEROBIC   Special Requests Blood Culture adequate volume  Final   Culture   Final    NO GROWTH 2 DAYS Performed at [facility], [address] [place], [place] [address]    Report Status PENDING  Incomplete    Radiology Reports CT ABDOMEN PELVIS WO CONTRAST  Result Date: 11/28/2019 CLINICAL DATA:  Centralized abdomen pain and nausea for 1 week. Patient's [id number] positive. EXAM: CT ABDOMEN AND PELVIS WITHOUT CONTRAST TECHNIQUE: Multidetector CT imaging of the abdomen and pelvis was performed following the standard protocol without IV contrast. COMPARISON:  November 28, 2016 FINDINGS: Lower chest: Extensive patchy consolidation is identified throughout bilateral lung bases. Hepatobiliary: No focal liver abnormality is seen. Status post cholecystectomy. No biliary dilatation. Pancreas: Pancreatic calcification is identified unchanged compared prior exam. There is no evidence of pancreatitis. No focal mass is identified in the pancreas. Spleen: Normal in size without focal abnormality. Adrenals/Urinary Tract: Bilateral  adrenal glands are normal. Left kidney cyst is identified in the upper pole unchanged. Right kidney is normal. There is no hydronephrosis bilaterally. Contrast filled bladder is normal. Stomach/Bowel: Stomach is within normal limits. Surgical clips are identified near the cecum suggesting prior appendectomy. No evidence of bowel wall thickening, distention, or inflammatory changes. Vascular/Lymphatic: Aortic atherosclerosis. No enlarged abdominal or pelvic lymph nodes. Reproductive: Uterus and bilateral adnexa are unremarkable. Other: No abdominal wall hernia or abnormality. No abdominopelvic ascites. Musculoskeletal: Degenerative joint changes of the spine are noted. IMPRESSION: 1. No acute abnormality identified  in the abdomen and pelvis. 2. Extensive patchy consolidation is identified throughout bilateral lung bases consistent with pneumonia. 3. Aortic atherosclerosis. Aortic Atherosclerosis (ICD10-I70.0). Electronically Signed   By: Abelardo Diesel M.D.   On: 11/28/2019 09:19   DG Chest 1 View  Result Date: 11/28/2019 CLINICAL DATA:  Central line placement.  History of COVID-19. EXAM: CHEST  1 VIEW COMPARISON:  Chest radiograph 12/19/2019 FINDINGS: No central venous catheter tip is present with tip projecting over the right atrium. Monitoring leads overlie the patient. Stable cardiac and mediastinal contours. Interval increase in bilateral mid lower lung airspace opacities. No pleural effusion or pneumothorax. Similar-appearing linear radiodensity projecting over the right lower hemithorax. IMPRESSION: 1. Central venous catheter tip projects over the right atrium. 2. Increasing bilateral mid and lower lung airspace opacities. Electronically Signed   By: Lovey Newcomer M.D.   On: 11/28/2019 14:18   CT Head Wo Contrast  Result Date: 11/09/2019 CLINICAL DATA:  Pain following recent fall EXAM: CT HEAD WITHOUT CONTRAST TECHNIQUE: Contiguous axial images were obtained from the base of the skull through the vertex  without intravenous contrast. COMPARISON:  None. FINDINGS: Brain: The ventricles and sulci are normal in size and configuration. There is no intracranial mass, hemorrhage, extra-axial fluid collection, or midline shift. The brain parenchyma appears normal. There is no demonstrable acute infarct. Vascular: No hyperdense vessel. No appreciable vascular calcification. Skull: The bony calvarium appears intact. Sinuses/Orbits: There is mucosal thickening in the maxillary antra bilaterally, more severe on the right than on the left. There is opacification of multiple ethmoid air cells. There is opacification in the frontal sinuses bilaterally. Orbits appear symmetric bilaterally. Other: The mastoid air cells are clear. IMPRESSION: Brain parenchyma appears unremarkable.  No mass or hemorrhage. Multifocal paranasal sinus disease noted. Electronically Signed   By: Lowella Grip III M.D.   On: 11/09/2019 09:39   CT Angio Chest PE W/Cm &/Or Wo Cm  Result Date: 11/28/2019 CLINICAL DATA:  COVID positive.  Shortness of breath. EXAM: CT ANGIOGRAPHY CHEST WITH CONTRAST TECHNIQUE: Multidetector CT imaging of the chest was performed using the standard protocol during bolus administration of intravenous contrast. Multiplanar CT image reconstructions and MIPs were obtained to evaluate the vascular anatomy. CONTRAST:  179mL OMNIPAQUE IOHEXOL 350 MG/ML SOLN COMPARISON:  January 30, 2013 FINDINGS: Cardiovascular: Examination is limited by respiratory motion artifact. Within that limitation, there is no central pulmonary embolus. The size of the main pulmonary artery is enlarged, measuring 3.6 cm. Heart size is normal, with no pericardial effusion. The course and caliber of the aorta are normal. There is no atherosclerotic calcification. Opacification decreased due to pulmonary arterial phase contrast bolus timing. Mediastinum/Nodes: -- No mediastinal lymphadenopathy. -- No hilar lymphadenopathy. -- No axillary lymphadenopathy. --  No supraclavicular lymphadenopathy. -- Normal thyroid gland where visualized. -the esophagus is fluid-filled to the level of the upper thorax. Lungs/Pleura: There are hazy bilateral ground-glass airspace opacities. There is no pneumothorax. There is a trace left-sided pleural effusion with adjacent atelectasis. Upper Abdomen: Contrast bolus timing is not optimized for evaluation of the abdominal organs. The visualized portions of the organs of the upper abdomen are normal. Musculoskeletal: No chest wall abnormality. No bony spinal canal stenosis. Review of the MIP images confirms the above findings. IMPRESSION: 1. Examination is limited by respiratory motion artifact. Within that limitation, there is no central pulmonary embolus. 2. Bilateral ground-glass airspace opacities, which can be seen in patients with an atypical infectious process such as viral pneumonia. 3. Trace left-sided pleural effusion  with adjacent atelectasis. 4. Dilated main pulmonary artery, which can be seen in patients with elevated pulmonary artery pressures. Electronically Signed   By: Constance Holster M.D.   On: 11/28/2019 02:29   DG Chest Port 1 View  Result Date: 11/21/2019 CLINICAL DATA:  Shortness of breath with hypoxia, history of COVID-19 positivity EXAM: PORTABLE CHEST 1 VIEW COMPARISON:  11/24/2019 FINDINGS: Cardiac shadow is stable. Bilateral airspace opacities are again identified and stable. Small right-sided pleural effusion is noted which is new from the prior study. No bony abnormality is seen. IMPRESSION: Stable airspace opacities consistent with the given clinical history. Small right-sided pleural effusion. Electronically Signed   By: Inez Catalina M.D.   On: 11/24/2019 23:13   DG Chest Portable 1 View  Result Date: 11/24/2019 CLINICAL DATA:  Shortness of breath, history of breast cancer, recent COVID admission EXAM: PORTABLE CHEST 1 VIEW COMPARISON:  11/09/2019 FINDINGS: Bilateral pulmonary opacities are present,  greatest in the lower left lung. This has increased since the prior study. No pleural effusion or pneumothorax. Stable cardiomediastinal contours with normal heart size. IMPRESSION: Bilateral pulmonary opacities, left greater than right. This is increased since 11/09/2019. Electronically Signed   By: Macy Mis M.D.   On: 11/24/2019 17:18   DG Chest Port 1 View  Result Date: 11/09/2019 CLINICAL DATA:  Cough and fever for the past week. EXAM: PORTABLE CHEST 1 VIEW COMPARISON:  Chest x-ray dated May 21, 2016. FINDINGS: The heart size and mediastinal contours are within normal limits. Normal pulmonary vascularity. Similar retained epicardial pacing wires overlying the right chest. New patchy opacities at the peripheral right lung base. No focal consolidation, pleural effusion, or pneumothorax. No acute osseous abnormality. IMPRESSION: 1. Right lower lobe pneumonia with appearance concerning for atypical infection, including viral pneumonia. Electronically Signed   By: Titus Dubin M.D.   On: 11/09/2019 09:04   [place] EKG SITE RITE  Result Date: 11/28/2019 If Site Rite image not attached, placement could not be confirmed due to current cardiac rhythm.    CBC Recent Labs  Lab 11/24/19 1636 11/25/19 0640 11/26/2019 2337 11/29/19 0428  WBC 6.3 4.3 9.1 8.2  HGB 12.5 10.9* 11.8* 9.1*  HCT 37.3 33.3* 35.4* 27.0*  PLT 324 303 322 257  MCV 90.3 90.5 91.0 91.2  MCH 30.3 29.6 30.3 30.7  MCHC 33.5 32.7 33.3 33.7  RDW 13.7 13.4 13.7 13.5  LYMPHSABS 0.3* 0.4* 0.5* 0.2*  MONOABS 0.2 0.2 0.2 0.2  EOSABS 0.0 0.0 0.1 0.0  BASOSABS 0.0 0.0 0.0 0.0    Chemistries  Recent Labs  Lab 11/24/19 1636 11/25/19 0640 11/22/2019 2337 11/29/19 0428  NA 134* 134* 135 139  K 3.9 3.8 3.4* 3.1*  CL 96* 98 96* 103  CO2 26 26 30 24   GLUCOSE 95 128* 97 204*  BUN 18 24* 15 16  CREATININE 0.91 0.70 0.72 0.44  CALCIUM 8.8* 8.4* 8.4* 7.0*  MG  --   --  2.0 2.0  AST  --  26 58* 35  ALT  --  23 71* 47*  ALKPHOS   --  66 90 58  BILITOT  --  0.7 0.9 0.6   ------------------------------------------------------------------------------------------------------------------ No results for input(s): CHOL, HDL, LDLCALC, TRIG, CHOLHDL, LDLDIRECT in the last 72 hours.  No results found for: HGBA1C ------------------------------------------------------------------------------------------------------------------ No results for input(s): TSH, T4TOTAL, T3FREE, THYROIDAB in the last 72 hours.  Invalid input(s): FREET3 ------------------------------------------------------------------------------------------------------------------ Recent Labs    11/29/19 0428  FERRITIN 1,386*    Coagulation profile  Recent Labs  Lab 11/24/19 1636 11/25/19 0640 11/26/19 0642 12/06/2019 2337 11/29/19 0428  INR 5.1* 6.7* 4.1* 2.2* 2.1*    No results for input(s): DDIMER in the last 72 hours.  Cardiac Enzymes No results for input(s): CKMB, TROPONINI, MYOGLOBIN in the last 168 hours.  Invalid input(s): CK ------------------------------------------------------------------------------------------------------------------    Component Value Date/Time   BNP 84.0 12/16/2019 2337    Roxan Hockey M.D on 11/30/2019 at 8:25 AM  Go to [email or web address] - for contact info  [facility] - Office  [phone]

## 2019-11-30 NOTE — Progress Notes (Signed)
°  Clarification of Isolation Requirements and Visitations:-  -daughter [name]) who is an Therapist, sports here at the [facility] would like to be able to visit her mother while she is in the hospital  -- -Patient and daughter understand that even though patient is more than 21 days from her diagnosis she remains quite symptomatic from the COVID-19 respiratory infection standpoint with fevers on admission, persistent cough, dyspnea and hypoxia requiring up to 6 L of oxygen via nasal cannula, and chest imaging findings showing significant Covid pneumonia type findings --- Given how symptomatic patient remains it is conceivable that patient may still be shedding COVID-19 virus  --It is recommended that patient continue to be isolated despite being out of the 21-day window until her symptoms improve further  -Furthermore both patient and patient's daughter  [name] Pitch who is an Therapist, sports --understand that visitiing patient even with an N-95 Mask and full PPE still carries a risk of infection for visitors -- -The decision to visit the patient with full PPE remains a personal decision for patient's daughter and patient to make - -Patient and her daughter understand the risk of potential breakthrough infection to patient's daughter who is an Therapist, sports despite being vaccinated  - I have neither authorized nor encouraged visitation by patient's daughter or other family members  - -However, patient's daughter and patient can make their own decision regarding the risk they are willing to accept while visiting patient who I believe is still very symptomatic from the COVID-19 infection standpoint and potentially still shedding  --The decision to visit patient is a personal decision for patient and her daughter and Not my decision -- [name] Hockey, MD

## 2019-12-01 DIAGNOSIS — J9601 Acute respiratory failure with hypoxia: Secondary | ICD-10-CM | POA: Diagnosis not present

## 2019-12-01 LAB — LEGIONELLA PNEUMOPHILA SEROGP 1 UR AG: L. pneumophila Serogp 1 Ur Ag: NEGATIVE

## 2019-12-01 LAB — CBC WITH DIFFERENTIAL/PLATELET
Abs Immature Granulocytes: 0.24 10*3/uL — ABNORMAL HIGH (ref 0.00–0.07)
Basophils Absolute: 0 10*3/uL (ref 0.0–0.1)
Basophils Relative: 0 %
Eosinophils Absolute: 0 10*3/uL (ref 0.0–0.5)
Eosinophils Relative: 0 %
HCT: 26.9 % — ABNORMAL LOW (ref 36.0–46.0)
Hemoglobin: 8.8 g/dL — ABNORMAL LOW (ref 12.0–15.0)
Immature Granulocytes: 3 %
Lymphocytes Relative: 2 %
Lymphs Abs: 0.1 10*3/uL — ABNORMAL LOW (ref 0.7–4.0)
MCH: 30.1 pg (ref 26.0–34.0)
MCHC: 32.7 g/dL (ref 30.0–36.0)
MCV: 92.1 fL (ref 80.0–100.0)
Monocytes Absolute: 0.3 10*3/uL (ref 0.1–1.0)
Monocytes Relative: 3 %
Neutro Abs: 8.4 10*3/uL — ABNORMAL HIGH (ref 1.7–7.7)
Neutrophils Relative %: 92 %
Platelets: 262 10*3/uL (ref 150–400)
RBC: 2.92 MIL/uL — ABNORMAL LOW (ref 3.87–5.11)
RDW: 13.5 % (ref 11.5–15.5)
WBC: 9.1 10*3/uL (ref 4.0–10.5)
nRBC: 0 % (ref 0.0–0.2)

## 2019-12-01 LAB — PROTIME-INR
INR: 4.6 (ref 0.8–1.2)
Prothrombin Time: 42.2 seconds — ABNORMAL HIGH (ref 11.4–15.2)

## 2019-12-01 LAB — COMPREHENSIVE METABOLIC PANEL
ALT: 56 U/L — ABNORMAL HIGH (ref 0–44)
AST: 29 U/L (ref 15–41)
Albumin: 2.2 g/dL — ABNORMAL LOW (ref 3.5–5.0)
Alkaline Phosphatase: 59 U/L (ref 38–126)
Anion gap: 10 (ref 5–15)
BUN: 12 mg/dL (ref 6–20)
CO2: 28 mmol/L (ref 22–32)
Calcium: 7.4 mg/dL — ABNORMAL LOW (ref 8.9–10.3)
Chloride: 97 mmol/L — ABNORMAL LOW (ref 98–111)
Creatinine, Ser: 0.42 mg/dL — ABNORMAL LOW (ref 0.44–1.00)
GFR, Estimated: >60 mL/min (ref >60)
Glucose, Bld: 162 mg/dL — ABNORMAL HIGH (ref 70–99)
Potassium: 3.5 mmol/L (ref 3.5–5.1)
Sodium: 135 mmol/L (ref 135–145)
Total Bilirubin: 0.3 mg/dL (ref 0.3–1.2)
Total Protein: 5 g/dL — ABNORMAL LOW (ref 6.5–8.1)

## 2019-12-01 LAB — FERRITIN: Ferritin: 420 ng/mL — ABNORMAL HIGH (ref 11–307)

## 2019-12-01 LAB — OCCULT BLOOD X 1 CARD TO LAB, STOOL: Fecal Occult Bld: NEGATIVE

## 2019-12-01 LAB — GLUCOSE, CAPILLARY: Glucose-Capillary: 127 mg/dL — ABNORMAL HIGH (ref 70–99)

## 2019-12-01 LAB — C-REACTIVE PROTEIN: CRP: 4.4 mg/dL — ABNORMAL HIGH (ref <1.0)

## 2019-12-01 LAB — MAGNESIUM: Magnesium: 2 mg/dL (ref 1.7–2.4)

## 2019-12-01 LAB — PHOSPHORUS: Phosphorus: 3.3 mg/dL (ref 2.5–4.6)

## 2019-12-01 MED ORDER — CALCIUM CARBONATE ANTACID 500 MG PO CHEW
1.0000 | CHEWABLE_TABLET | Freq: Three times a day (TID) | ORAL | Status: DC
  Administered 2019-12-01 – 2019-12-04 (×6): 200 mg via ORAL
  Filled 2019-12-01 (×8): qty 1

## 2019-12-01 MED ORDER — POLYETHYLENE GLYCOL 3350 17 G PO PACK: 17.0000 g | PACK | Freq: Every day | ORAL | Status: DC

## 2019-12-01 MED ORDER — POLYETHYLENE GLYCOL 3350 17 G PO PACK
17.0000 g | PACK | Freq: Every day | ORAL | Status: DC
  Administered 2019-12-02 – 2019-12-07 (×4): 17 g via ORAL
  Filled 2019-12-01 (×7): qty 1

## 2019-12-01 MED ORDER — PANTOPRAZOLE SODIUM 40 MG PO TBEC
40.0000 mg | DELAYED_RELEASE_TABLET | Freq: Two times a day (BID) | ORAL | Status: DC
  Administered 2019-12-01 – 2019-12-06 (×11): 40 mg via ORAL
  Filled 2019-12-01 (×11): qty 1

## 2019-12-01 MED ORDER — OXYCODONE HCL 5 MG PO TABS
10.0000 mg | ORAL_TABLET | Freq: Once | ORAL | Status: AC
  Administered 2019-12-01: 10 mg via ORAL
  Filled 2019-12-01: qty 2

## 2019-12-01 MED ORDER — LACTULOSE 10 GM/15ML PO SOLN
30.0000 g | Freq: Once | ORAL | Status: AC
  Administered 2019-12-01: 30 g via ORAL
  Filled 2019-12-01: qty 60

## 2019-12-01 MED ORDER — BISACODYL 10 MG RE SUPP
10.0000 mg | Freq: Once | RECTAL | Status: AC
  Administered 2019-12-01: 10 mg via RECTAL
  Filled 2019-12-01: qty 1

## 2019-12-01 MED ORDER — POTASSIUM CHLORIDE CRYS ER 20 MEQ PO TBCR
40.0000 meq | EXTENDED_RELEASE_TABLET | Freq: Once | ORAL | Status: AC
  Administered 2019-12-01: 40 meq via ORAL
  Filled 2019-12-01: qty 2

## 2019-12-01 NOTE — Progress Notes (Signed)
CRITICAL VALUE ALERT  Critical Value:  INR 4.6  Date & Time Notied:  12/01/2019 at 12:08  Provider Notified: Dr. Denton Brick  Orders Received/Actions taken: Notified MD, no orders received at this time.

## 2019-12-01 NOTE — Progress Notes (Signed)
Pt sats were in mid 46s. Proned patient but sats continued to drop to the 40s. Pt said her abdomen hurt too much to lay prone. Asked patient to roll to side and sats went up to 70. Pt now on nonrebreather. Pt sats are 98. Will continue to monitor patient.

## 2019-12-01 NOTE — Progress Notes (Signed)
RT called per patient sats dropping in the 70's despite being on 6L HFNC. Upon entering room patient was eating. RT increased liter flow on HFNC to 15L with improvement in sats to 92%. Will titrate back down as tolerated. Will continue to monitor.

## 2019-12-01 NOTE — Progress Notes (Signed)
Patient is alert and oriented. At beginning of shift, oxygen was 92-95% on 6L [place]. Patient does desat with activity. At approximately 1315, patient oxygen levels were maintaining in the high 70s- low 80s. Notified MD and RT. RT increased her HFNC to 15 L. Patient oxygen saturation has been 92-94% on 15L HFNC. Patient did ambulate to bathroom with her daughter, who is a Marine scientist. Her oxygen level dropped into the high 60s- low 70s with activity. Educated patient on importance of calling out for assistance and not ambulating to bathroom. Will use bed side commode or bedpan until patient more stable. Oxygen level returned to 95% after approximately 5 minutes of rest. Non-rebreather mask is at the bedside.

## 2019-12-01 NOTE — Plan of Care (Signed)

## 2019-12-01 NOTE — Progress Notes (Signed)
Patient Demographics:    [name], is a 49 y.o. female, DOB - Jan 06, 1971, FWY:[id number]  Admit date - 11/20/2019   Admitting Physician Bernadette Hoit, DO  Outpatient Primary MD for the patient is Sharilyn Sites, MD  LOS - 3  Chief Complaint  Patient presents with   Shortness of Breath        Subjective:    [name] today has   No chest pain,     Patient's daughter [name] at bedside--discussed with patient and daughter at bedside, questions answered  --Dyspnea and hypoxia worsening ---patient with increased oxygen requirement--was requiring up to 15 L of oxygen via nasal cannula, currently down to 12 L -No further nosebleeds Had BM x 2   Assessment  & Plan :    Principal Problem:   Acute respiratory failure with hypoxia ([place]) Active Problems:   Anticoagulation goal of INR 2.5 to 3.5   Pneumonia due to COVID-19 virus   History of mitral valve replacement with mechanical valve   Systemic lupus erythematosus ([place])   Gastroparesis   Primary hypothyroidism   Hypokalemia   Hypophosphatemia   Transaminitis   Hypoalbuminemia   Acute respiratory disease due to COVID-19 virus   Brief Summary:- 49 y.o.femalewith medical history significant forSLE, rheumatoid arthritis, Raynaud's, hypothyroidism, and gastroparesis with persistent nausea as well as mitral valve replacement on chronic anticoagulation and recent COVID-19 infection/pneumonia --- recently discharged home with home O2 on 11/26/19 ---Readmitted on 11/28/2019 with worsening hypoxia and dyspnea and significant tachycardia and tachypnea--- -patient is immunocompromised due to immunosuppression for SLE and rheumatoid arthritis -She was not vaccinated   A/p 1)Acute respiratory failuredue to progression of Covid pneumonia-readmitted on 11/28/2019 with worsening hypoxia dyspnea and significant tachycardia and tachypnea --despite  completing treatment with steroids and remdesivir. -CTA chest on 11/28/2019 with bilateral groundglassopacities consistent with Covid pneumonia along with elevated pulmonary artery pressures but no evidence of PE --Dyspnea and hypoxia worsening ---patient with increased oxygen requirement--was requiring up to 15 L of oxygen via nasal cannula, currently down to 12 L -Procalcitonin is up to 0.34 >> 0.13 -COVID-19 admission protocol -Mucolytic's, inhalers as needed -Patient has already completed course of remdesivir on recent hospitalization -Continue bronchodilators and mucolytics -Continue IV Solu-Medrol  COVID-19 Labs  Recent Labs    11/29/19 0428 11/30/19 0733 12/01/19 0546  FERRITIN 1,386* 1,164* 420*  CRP 13.0* 4.5* 4.4*    Lab Results  Component Value Date   SARSCOV2NAA POSITIVE (A) 11/09/2019   [place] Not Detected 03/10/2019     Rheumatoid arthritis, SLE, immunosuppression-stable. - Hold rituximab -Currently on steroids for Covid pneumonia -As she has been off her immunosuppressants for a while, patient should discuss with her rheumatologist on discharge if she can get the Covid vaccine in the near future  Mitral valve replacement with mechanical valve, supratherapeutic INR Her anticoagulation goal INR is 2.5-3.5.  Hemoglobin is stable no sign of bleeding. -INR -was recently supratherapeutic, currently 4.6 -Patient had an episode of nosebleed on 11/29/2019 -Pharmacy to manage Coumadin  Acute on chronic anemia--hemoglobin is down to 8.8 from a baseline around 11- -patient is an episode of nosebleed on 11/29/2019 -Monitor closely  -Possible UTI--- UA from 11/29/2019 suspicious for UTI, urine culture was not obtained,  patient was empirically  treated with  IV Rocephin for total of 3 days -Patient had fever on presentation on 11/28/2019, no further fevers  Gastroparesis/GERD Complains of increasing nausea and heartburn okay to increase Reglan, may use as  needed Phenergan --Suspect GI symptoms are worsened due to steroid therapy -Continue Protonix, add Carafate  Hypothyroidism Continue home Synthroid  -Hypokalemia and hypophosphatemia--- replace and recheck  SVT/tachyarrhythmia--- tachycardia on admission, since resolved --it was in the setting of electrolyte derangement and persistent profound hypoxia---   Elevated transaminitis AST 58>> 35>>39>.29 ALT 71>>47>>63>>56 Continue to monitor liver panel  Hypoalbuminemia possibly secondary to moderate protein calorie malnutrition Albumin 2.4; this is possibly due to decreased appetite secondary to Covid 19 pneumonia Dietary  Consult -Continue nutritional supplements    Code Status:Full code Family Communication:Updated patient's daughter at patient's request, patient's daughter is an Therapist, sports at this facility Consults called:None   Disposition/Need for in-Hospital Stay- patient unable to be discharged at this time due to ---   Covid pneumonia with acute hypoxic respiratory failure requiring supplemental oxygen and IV steroids  Status is: Inpatient  Remains inpatient appropriate because:Covid pneumonia with acute hypoxic respiratory failure requiring supplemental oxygen and IV steroids   Disposition: The patient is from: Home              Anticipated d/c is to: Home              Anticipated d/c date is: > 3 days              Patient currently is not medically stable to d/c. Barriers: Not Clinically Stable-   Covid pneumonia with acute hypoxic respiratory failure requiring supplemental oxygen and IV steroids Consults  :  na  DVT Prophylaxis  : Coumadin- SCDs   Lab Results  Component Value Date   PLT 262 12/01/2019    Inpatient Medications  Scheduled Meds:  albuterol  2 puff Inhalation QID   vitamin C  500 mg Oral Daily   calcium carbonate  1 tablet Oral TID WC   Chlorhexidine Gluconate Cloth  6 each Topical Daily   dextromethorphan-guaiFENesin  1 tablet Oral BID     feeding supplement (ENSURE ENLIVE)  237 mL Oral TID BM   furosemide  20 mg Oral Daily   levothyroxine  75 mcg Oral QAC breakfast   methylPREDNISolone (SOLU-MEDROL) injection  40 mg Intravenous Q12H   metoCLOPramide  10 mg Oral TID AC   multivitamin with minerals  1 tablet Oral Daily   oxyCODONE  10 mg Oral Once   pantoprazole  40 mg Oral BID AC   sucralfate  1 g Oral TID WC & HS   Warfarin - Pharmacist Dosing Inpatient   Does not apply q1600   zinc sulfate  220 mg Oral Daily   zolpidem  5 mg Oral QHS   Continuous Infusions:  PRN Meds:.acetaminophen, ALPRAZolam, chlorpheniramine-HYDROcodone, promethazine    Anti-infectives (From admission, onward)   Start     Dose/Rate Route Frequency Ordered Stop   12/01/19 0400  cefTRIAXone (ROCEPHIN) 2 g in sodium chloride 0.9 % 100 mL IVPB        2 g 200 mL/hr over 30 Minutes Intravenous Every 24 hours 11/30/19 1518 12/01/19 0700   11/29/19 0400  cefTRIAXone (ROCEPHIN) 2 g in sodium chloride 0.9 % 100 mL IVPB  Status:  Discontinued        2 g 200 mL/hr over 30 Minutes Intravenous Every 24 hours 11/28/19 1427 11/30/19 1518   11/28/19 1500  azithromycin (ZITHROMAX)  500 mg in sodium chloride 0.9 % 250 mL IVPB        500 mg 250 mL/hr over 60 Minutes Intravenous Every 24 hours 11/28/19 1427 12/01/19 1624   11/28/19 0500  azithromycin (ZITHROMAX) tablet 500 mg        500 mg Oral  Once 11/28/19 0451 11/28/19 0515   11/28/19 0300  cefTRIAXone (ROCEPHIN) 2 g in sodium chloride 0.9 % 100 mL IVPB        2 g 200 mL/hr over 30 Minutes Intravenous  Once 11/28/19 0249 11/28/19 0501   11/28/19 0300  azithromycin (ZITHROMAX) 500 mg in sodium chloride 0.9 % 250 mL IVPB  Status:  Discontinued        500 mg 250 mL/hr over 60 Minutes Intravenous Every 24 hours 11/28/19 0249 11/28/19 0451        Objective:   Vitals:   12/01/19 1115 12/01/19 1256 12/01/19 1331 12/01/19 1507  BP:   102/70   Pulse:   81   Resp:   20   Temp:   98.7 F (37.1  C)   TempSrc:   Oral   SpO2: 97% 92% 94% 92%  Weight:      Height:        Wt Readings from Last 3 Encounters:  11/30/19 66 kg  11/24/19 67.6 kg  11/09/19 69.4 kg     Intake/Output Summary (Last 24 hours) at 12/01/2019 1935 Last data filed at 12/01/2019 1500 Gross per 24 hour  Intake 610 ml  Output --  Net 610 ml     Physical Exam  Gen:- Awake Alert,  In no apparent distress  HEENT:- [place].AT, No sclera icterus Nose- [place] 12L/min , resolved nosebleed Neck-Supple Neck,, Lt  IJ central line Lungs-diminished breath sounds, scattered rhonchi  CV- S1, S2 normal, regular  Abd-  +ve B.Sounds, Abd Soft, epigastric discomfort, no rebound    Extremity/Skin:- No  edema, pedal pulses present  Psych-affect is appropriate, oriented x3 Neuro-generalized weakness and deconditioning ,no new focal deficits, no tremors   Data Review:   Micro Results Recent Results (from the past 240 hour(s))  Culture, blood (Routine X 2) w Reflex to ID Panel     Status: None (Preliminary result)   Collection Time: 11/28/19 12:15 PM   Specimen: BLOOD RIGHT FOREARM  Result Value Ref Range Status   Specimen Description   Final    BLOOD RIGHT FOREARM BOTTLES DRAWN AEROBIC AND ANAEROBIC   Special Requests Blood Culture adequate volume  Final   Culture   Final    NO GROWTH 2 DAYS Performed at [facility], [address], [place], [place] [address]    Report Status PENDING  Incomplete    Radiology Reports CT ABDOMEN PELVIS WO CONTRAST  Result Date: 11/28/2019 CLINICAL DATA:  Centralized abdomen pain and nausea for 1 week. Patient's JQBHA-19 positive. EXAM: CT ABDOMEN AND PELVIS WITHOUT CONTRAST TECHNIQUE: Multidetector CT imaging of the abdomen and pelvis was performed following the standard protocol without IV contrast. COMPARISON:  November 28, 2016 FINDINGS: Lower chest: Extensive patchy consolidation is identified throughout bilateral lung bases. Hepatobiliary: No focal liver abnormality is seen. Status  post cholecystectomy. No biliary dilatation. Pancreas: Pancreatic calcification is identified unchanged compared prior exam. There is no evidence of pancreatitis. No focal mass is identified in the pancreas. Spleen: Normal in size without focal abnormality. Adrenals/Urinary Tract: Bilateral adrenal glands are normal. Left kidney cyst is identified in the upper pole unchanged. Right kidney is normal. There is no hydronephrosis  bilaterally. Contrast filled bladder is normal. Stomach/Bowel: Stomach is within normal limits. Surgical clips are identified near the cecum suggesting prior appendectomy. No evidence of bowel wall thickening, distention, or inflammatory changes. Vascular/Lymphatic: Aortic atherosclerosis. No enlarged abdominal or pelvic lymph nodes. Reproductive: Uterus and bilateral adnexa are unremarkable. Other: No abdominal wall hernia or abnormality. No abdominopelvic ascites. Musculoskeletal: Degenerative joint changes of the spine are noted. IMPRESSION: 1. No acute abnormality identified in the abdomen and pelvis. 2. Extensive patchy consolidation is identified throughout bilateral lung bases consistent with pneumonia. 3. Aortic atherosclerosis. Aortic Atherosclerosis (ICD10-I70.0). Electronically Signed   By: Abelardo Diesel M.D.   On: 11/28/2019 09:19   DG Chest 1 View  Result Date: 11/28/2019 CLINICAL DATA:  Central line placement.  History of COVID-19. EXAM: CHEST  1 VIEW COMPARISON:  Chest radiograph 12/10/2019 FINDINGS: No central venous catheter tip is present with tip projecting over the right atrium. Monitoring leads overlie the patient. Stable cardiac and mediastinal contours. Interval increase in bilateral mid lower lung airspace opacities. No pleural effusion or pneumothorax. Similar-appearing linear radiodensity projecting over the right lower hemithorax. IMPRESSION: 1. Central venous catheter tip projects over the right atrium. 2. Increasing bilateral mid and lower lung airspace  opacities. Electronically Signed   By: Lovey Newcomer M.D.   On: 11/28/2019 14:18   CT Head Wo Contrast  Result Date: 11/09/2019 CLINICAL DATA:  Pain following recent fall EXAM: CT HEAD WITHOUT CONTRAST TECHNIQUE: Contiguous axial images were obtained from the base of the skull through the vertex without intravenous contrast. COMPARISON:  None. FINDINGS: Brain: The ventricles and sulci are normal in size and configuration. There is no intracranial mass, hemorrhage, extra-axial fluid collection, or midline shift. The brain parenchyma appears normal. There is no demonstrable acute infarct. Vascular: No hyperdense vessel. No appreciable vascular calcification. Skull: The bony calvarium appears intact. Sinuses/Orbits: There is mucosal thickening in the maxillary antra bilaterally, more severe on the right than on the left. There is opacification of multiple ethmoid air cells. There is opacification in the frontal sinuses bilaterally. Orbits appear symmetric bilaterally. Other: The mastoid air cells are clear. IMPRESSION: Brain parenchyma appears unremarkable.  No mass or hemorrhage. Multifocal paranasal sinus disease noted. Electronically Signed   By: Lowella Grip III M.D.   On: 11/09/2019 09:39   CT Angio Chest PE W/Cm &/Or Wo Cm  Result Date: 11/28/2019 CLINICAL DATA:  COVID positive.  Shortness of breath. EXAM: CT ANGIOGRAPHY CHEST WITH CONTRAST TECHNIQUE: Multidetector CT imaging of the chest was performed using the standard protocol during bolus administration of intravenous contrast. Multiplanar CT image reconstructions and MIPs were obtained to evaluate the vascular anatomy. CONTRAST:  181mL OMNIPAQUE IOHEXOL 350 MG/ML SOLN COMPARISON:  January 30, 2013 FINDINGS: Cardiovascular: Examination is limited by respiratory motion artifact. Within that limitation, there is no central pulmonary embolus. The size of the main pulmonary artery is enlarged, measuring 3.6 cm. Heart size is normal, with no  pericardial effusion. The course and caliber of the aorta are normal. There is no atherosclerotic calcification. Opacification decreased due to pulmonary arterial phase contrast bolus timing. Mediastinum/Nodes: -- No mediastinal lymphadenopathy. -- No hilar lymphadenopathy. -- No axillary lymphadenopathy. -- No supraclavicular lymphadenopathy. -- Normal thyroid gland where visualized. -the esophagus is fluid-filled to the level of the upper thorax. Lungs/Pleura: There are hazy bilateral ground-glass airspace opacities. There is no pneumothorax. There is a trace left-sided pleural effusion with adjacent atelectasis. Upper Abdomen: Contrast bolus timing is not optimized for evaluation of the abdominal organs.  The visualized portions of the organs of the upper abdomen are normal. Musculoskeletal: No chest wall abnormality. No bony spinal canal stenosis. Review of the MIP images confirms the above findings. IMPRESSION: 1. Examination is limited by respiratory motion artifact. Within that limitation, there is no central pulmonary embolus. 2. Bilateral ground-glass airspace opacities, which can be seen in patients with an atypical infectious process such as viral pneumonia. 3. Trace left-sided pleural effusion with adjacent atelectasis. 4. Dilated main pulmonary artery, which can be seen in patients with elevated pulmonary artery pressures. Electronically Signed   By: Constance Holster M.D.   On: 11/28/2019 02:29   DG Chest Port 1 View  Result Date: 12/11/2019 CLINICAL DATA:  Shortness of breath with hypoxia, history of COVID-19 positivity EXAM: PORTABLE CHEST 1 VIEW COMPARISON:  11/24/2019 FINDINGS: Cardiac shadow is stable. Bilateral airspace opacities are again identified and stable. Small right-sided pleural effusion is noted which is new from the prior study. No bony abnormality is seen. IMPRESSION: Stable airspace opacities consistent with the given clinical history. Small right-sided pleural effusion.  Electronically Signed   By: Inez Catalina M.D.   On: 11/22/2019 23:13   DG Chest Portable 1 View  Result Date: 11/24/2019 CLINICAL DATA:  Shortness of breath, history of breast cancer, recent COVID admission EXAM: PORTABLE CHEST 1 VIEW COMPARISON:  11/09/2019 FINDINGS: Bilateral pulmonary opacities are present, greatest in the lower left lung. This has increased since the prior study. No pleural effusion or pneumothorax. Stable cardiomediastinal contours with normal heart size. IMPRESSION: Bilateral pulmonary opacities, left greater than right. This is increased since 11/09/2019. Electronically Signed   By: Macy Mis M.D.   On: 11/24/2019 17:18   DG Chest Port 1 View  Result Date: 11/09/2019 CLINICAL DATA:  Cough and fever for the past week. EXAM: PORTABLE CHEST 1 VIEW COMPARISON:  Chest x-ray dated May 21, 2016. FINDINGS: The heart size and mediastinal contours are within normal limits. Normal pulmonary vascularity. Similar retained epicardial pacing wires overlying the right chest. New patchy opacities at the peripheral right lung base. No focal consolidation, pleural effusion, or pneumothorax. No acute osseous abnormality. IMPRESSION: 1. Right lower lobe pneumonia with appearance concerning for atypical infection, including viral pneumonia. Electronically Signed   By: Titus Dubin M.D.   On: 11/09/2019 09:04   [place] EKG SITE RITE  Result Date: 11/28/2019 If Site Rite image not attached, placement could not be confirmed due to current cardiac rhythm.    CBC Recent Labs  Lab 11/25/19 0640 12/06/2019 2337 11/29/19 0428 11/30/19 0733 12/01/19 0546  WBC 4.3 9.1 8.2 13.1* 9.1  HGB 10.9* 11.8* 9.1* 9.6* 8.8*  HCT 33.3* 35.4* 27.0* 28.9* 26.9*  PLT 303 322 257 339 262  MCV 90.5 91.0 91.2 91.7 92.1  MCH 29.6 30.3 30.7 30.5 30.1  MCHC 32.7 33.3 33.7 33.2 32.7  RDW 13.4 13.7 13.5 13.8 13.5  LYMPHSABS 0.4* 0.5* 0.2* 0.3* 0.1*  MONOABS 0.2 0.2 0.2 0.4 0.3  EOSABS 0.0 0.1 0.0 0.0 0.0   BASOSABS 0.0 0.0 0.0 0.0 0.0    Chemistries  Recent Labs  Lab 11/25/19 0640 12/17/2019 2337 11/29/19 0428 11/30/19 0733 12/01/19 0546  NA 134* 135 139 134* 135  K 3.8 3.4* 3.1* 3.6 3.5  CL 98 96* 103 98 97*  CO2 26 30 24 26 28   GLUCOSE 128* 97 204* 114* 162*  BUN 24* 15 16 16 12   CREATININE 0.70 0.72 0.44 0.44 0.42*  CALCIUM 8.4* 8.4* 7.0* 7.9* 7.4*  MG  --  2.0 2.0 2.3 2.0  AST 26 58* 35 39 29  ALT 23 71* 47* 63* 56*  ALKPHOS 66 90 58 60 59  BILITOT 0.7 0.9 0.6 0.5 0.3   ------------------------------------------------------------------------------------------------------------------ No results for input(s): CHOL, HDL, LDLCALC, TRIG, CHOLHDL, LDLDIRECT in the last 72 hours.  No results found for: HGBA1C ------------------------------------------------------------------------------------------------------------------ No results for input(s): TSH, T4TOTAL, T3FREE, THYROIDAB in the last 72 hours.  Invalid input(s): FREET3 ------------------------------------------------------------------------------------------------------------------ Recent Labs    11/30/19 0733 12/01/19 0546  FERRITIN 1,164* 420*    Coagulation profile Recent Labs  Lab 11/26/19 0642 12/05/2019 2337 11/29/19 0428 11/30/19 0733 12/01/19 0546  INR 4.1* 2.2* 2.1* 3.2* 4.6*    No results for input(s): DDIMER in the last 72 hours.  Cardiac Enzymes No results for input(s): CKMB, TROPONINI, MYOGLOBIN in the last 168 hours.  Invalid input(s): CK ------------------------------------------------------------------------------------------------------------------    Component Value Date/Time   BNP 84.0 12/05/2019 2337    Roxan Hockey M.D on 12/01/2019 at 7:35 PM  Go to [email or web address] - for contact info  [facility] - Office  [phone]

## 2019-12-01 NOTE — Progress Notes (Signed)
ANTICOAGULATION CONSULT NOTE -   Pharmacy Consult for:  warfarin dosing Indication:  Mechanical valve  Vital Signs: Temp: 97.9 F (36.6 C) (10/12 0721) Temp Source: Oral (10/12 0721) BP: 107/70 (10/12 0721) Pulse Rate: 71 (10/12 0320)  Labs: Recent Labs    11/29/19 0428 11/29/19 0428 11/30/19 0733 12/01/19 0546  HGB 9.1*   < > 9.6* 8.8*  HCT 27.0*  --  28.9* 26.9*  PLT 257  --  339 262  APTT 62*  --   --   --   LABPROT 23.1*  --  31.3* 42.2*  INR 2.1*  --  3.2* 4.6*  CREATININE 0.44  --  0.44 0.42*  CKTOTAL  --   --  56  --    < > = values in this interval not displayed.    Estimated Creatinine Clearance: 77.8 mL/min (A) (by C-G formula based on SCr of 0.42 mg/dL (L)).    Assessment: Pharmacy consulted to dose warfarin  for this 49 yo female on chronic anti-coagulation therapy with warfarin for history of mechanical valve .  Patient was admitted with high INR of 4.1 on 10/7 and took her last dose of warfarin 2.5mg  on 11/26/19.  Patient is currently having nausea, possibly affecting Vit K intake.  Home warfarin dose:  5mg  on Sun/Mon/Wed/Fri and 7.5mg  on Tues/Thurs/Sat Dose recently reduced to 2.5mg  and several doses were held last week.  12/01/19 update INR: 4.6 supratherapeutic Hb: 9.6 (baseline 11.8)        Goal of Therapy:  INR Goal: 2.5-3.5 Monitor platelets by anticoagulation protocol: Yes   Plan:  No coumadin today Daily INR and every other day CBC Monitor for signs and symptoms of bleeding.  Isac Sarna, BS Vena [place], BCPS Clinical Pharmacist Pager [phone] 12/01/2019,1:14 PM

## 2019-12-02 DIAGNOSIS — J069 Acute upper respiratory infection, unspecified: Secondary | ICD-10-CM

## 2019-12-02 DIAGNOSIS — Z5181 Encounter for therapeutic drug level monitoring: Secondary | ICD-10-CM | POA: Diagnosis not present

## 2019-12-02 DIAGNOSIS — U071 COVID-19: Secondary | ICD-10-CM | POA: Diagnosis not present

## 2019-12-02 DIAGNOSIS — Z7901 Long term (current) use of anticoagulants: Secondary | ICD-10-CM

## 2019-12-02 DIAGNOSIS — E43 Unspecified severe protein-calorie malnutrition: Secondary | ICD-10-CM | POA: Insufficient documentation

## 2019-12-02 DIAGNOSIS — K3184 Gastroparesis: Secondary | ICD-10-CM | POA: Diagnosis not present

## 2019-12-02 DIAGNOSIS — J9601 Acute respiratory failure with hypoxia: Secondary | ICD-10-CM | POA: Diagnosis not present

## 2019-12-02 LAB — COMPREHENSIVE METABOLIC PANEL
ALT: 47 U/L — ABNORMAL HIGH (ref 0–44)
AST: 26 U/L (ref 15–41)
Albumin: 2.2 g/dL — ABNORMAL LOW (ref 3.5–5.0)
Alkaline Phosphatase: 62 U/L (ref 38–126)
Anion gap: 6 (ref 5–15)
BUN: 13 mg/dL (ref 6–20)
CO2: 29 mmol/L (ref 22–32)
Calcium: 8.1 mg/dL — ABNORMAL LOW (ref 8.9–10.3)
Chloride: 100 mmol/L (ref 98–111)
Creatinine, Ser: 0.39 mg/dL — ABNORMAL LOW (ref 0.44–1.00)
GFR, Estimated: >60 mL/min (ref >60)
Glucose, Bld: 111 mg/dL — ABNORMAL HIGH (ref 70–99)
Potassium: 4.1 mmol/L (ref 3.5–5.1)
Sodium: 135 mmol/L (ref 135–145)
Total Bilirubin: 0.5 mg/dL (ref 0.3–1.2)
Total Protein: 5 g/dL — ABNORMAL LOW (ref 6.5–8.1)

## 2019-12-02 LAB — PROTIME-INR
INR: 3.4 — ABNORMAL HIGH (ref 0.8–1.2)
Prothrombin Time: 33.2 seconds — ABNORMAL HIGH (ref 11.4–15.2)

## 2019-12-02 LAB — C-REACTIVE PROTEIN: CRP: 7.1 mg/dL — ABNORMAL HIGH (ref <1.0)

## 2019-12-02 LAB — CBC WITH DIFFERENTIAL/PLATELET
Abs Immature Granulocytes: 0.28 10*3/uL — ABNORMAL HIGH (ref 0.00–0.07)
Basophils Absolute: 0 10*3/uL (ref 0.0–0.1)
Basophils Relative: 0 %
Eosinophils Absolute: 0 10*3/uL (ref 0.0–0.5)
Eosinophils Relative: 0 %
HCT: 27.5 % — ABNORMAL LOW (ref 36.0–46.0)
Hemoglobin: 9.2 g/dL — ABNORMAL LOW (ref 12.0–15.0)
Immature Granulocytes: 2 %
Lymphocytes Relative: 2 %
Lymphs Abs: 0.2 10*3/uL — ABNORMAL LOW (ref 0.7–4.0)
MCH: 31 pg (ref 26.0–34.0)
MCHC: 33.5 g/dL (ref 30.0–36.0)
MCV: 92.6 fL (ref 80.0–100.0)
Monocytes Absolute: 0.2 10*3/uL (ref 0.1–1.0)
Monocytes Relative: 2 %
Neutro Abs: 12 10*3/uL — ABNORMAL HIGH (ref 1.7–7.7)
Neutrophils Relative %: 94 %
Platelets: 253 10*3/uL (ref 150–400)
RBC: 2.97 MIL/uL — ABNORMAL LOW (ref 3.87–5.11)
RDW: 13.8 % (ref 11.5–15.5)
WBC: 12.7 10*3/uL — ABNORMAL HIGH (ref 4.0–10.5)
nRBC: 0 % (ref 0.0–0.2)

## 2019-12-02 LAB — FERRITIN: Ferritin: 279 ng/mL (ref 11–307)

## 2019-12-02 LAB — PHOSPHORUS: Phosphorus: 3 mg/dL (ref 2.5–4.6)

## 2019-12-02 LAB — MAGNESIUM: Magnesium: 2.1 mg/dL (ref 1.7–2.4)

## 2019-12-02 MED ORDER — KETOROLAC TROMETHAMINE 30 MG/ML IJ SOLN
30.0000 mg | Freq: Four times a day (QID) | INTRAMUSCULAR | Status: AC | PRN
  Administered 2019-12-02 (×2): 30 mg via INTRAVENOUS
  Filled 2019-12-02 (×2): qty 1

## 2019-12-02 MED ORDER — WARFARIN SODIUM 2.5 MG PO TABS
2.5000 mg | ORAL_TABLET | Freq: Once | ORAL | Status: AC
  Administered 2019-12-02: 2.5 mg via ORAL
  Filled 2019-12-02: qty 1

## 2019-12-02 MED ORDER — KETOROLAC TROMETHAMINE 30 MG/ML IJ SOLN: 30.0000 mg | Freq: Four times a day (QID) | INTRAMUSCULAR | Status: DC | PRN

## 2019-12-02 MED ORDER — BARICITINIB 2 MG PO TABS
4.0000 mg | ORAL_TABLET | Freq: Every day | ORAL | Status: DC
  Administered 2019-12-02 – 2019-12-04 (×3): 4 mg via ORAL
  Filled 2019-12-02 (×3): qty 2

## 2019-12-02 MED ORDER — TRAMADOL HCL 50 MG PO TABS
50.0000 mg | ORAL_TABLET | Freq: Four times a day (QID) | ORAL | Status: DC | PRN
  Administered 2019-12-02 – 2019-12-03 (×3): 50 mg via ORAL
  Filled 2019-12-02 (×3): qty 1

## 2019-12-02 MED ORDER — ACETAMINOPHEN 325 MG PO TABS
650.0000 mg | ORAL_TABLET | Freq: Four times a day (QID) | ORAL | Status: DC
  Administered 2019-12-02 – 2019-12-04 (×6): 650 mg via ORAL
  Filled 2019-12-02 (×7): qty 2

## 2019-12-02 NOTE — Progress Notes (Signed)
PROGRESS NOTE   [name]  LSL:[id number] DOB: [birth year] DOA: 12/08/2019 PCP: Sharilyn Sites, MD   Chief Complaint  Patient presents with  . Shortness of Breath    Brief Admission History:  49 y.o.femalewith medical history significant forSLE, rheumatoid arthritis, Raynaud's, hypothyroidism, and gastroparesis with persistent nausea as well as mitral valve replacement on chronic anticoagulation and recent COVID-19 infection/pneumonia --- recently discharged home with home O2 on 11/26/19 ---Readmitted on 11/28/2019 with worsening hypoxia and dyspnea and significant tachycardia and tachypnea--- -patient is immunocompromised due to immunosuppression for SLE and rheumatoid arthritis -She was not vaccinated  Assessment & Plan:   Acute respiratory failuredue to progression of Covid pneumonia-readmitted on 11/28/2019 with worsening hypoxia dyspnea and significant tachycardia and tachypnea --despite completing treatment with steroids and remdesivir.  -CTA chest on 11/28/2019 with bilateral groundglassopacities consistent with Covid pneumonia along with elevated pulmonary artery pressures but no evidence of PE  --Dyspnea and hypoxia worsening ---patient with increased oxygen requirements--was requiring up to 15 L of oxygen via nasal cannula PLUS NRB -Procalcitonin is up to 0.34 >> 0.13 -COVID-19 admission protocol -Mucolytics, inhalers as needed -Patient has already completed course of remdesivir on recent hospitalization -Continue bronchodilators and mucolytics -Continue IV Solu-Medrol -as patient clinically is declining with worsening respiratory status, adding baricitinib 10/13  Rheumatoid arthritis, SLE, immunosuppression-stable. - Hold rituximab -Currently on steroids for Covid pneumonia -As she has been off her immunosuppressants for a while,patient should discuss with herrheumatologist on discharge if she can get the Covid vaccine in the near future  Mitral valve  replacement with mechanical valve, supratherapeutic INR Her anticoagulation goal INR is 2.5-3.5.  Hemoglobin is stable no sign of bleeding. -INR -was recently supratherapeutic, currently 4.6 -Patient had an episode of nosebleed on 11/29/2019 -Pharmacy to manage warfarin  Acute on chronic anemia--hemoglobin is down to 8.8 from a baseline around 11- -patient is an episode of nosebleed on 11/29/2019 -Monitor closely  -Possible UTI--- UA from 11/29/2019 suspicious for UTI, urine culture was not obtained,  patient was empirically treated with  IV Rocephin for total of 3 days -Patient had fever on presentation on 11/28/2019, no further fevers  Gastroparesis/GERD Complains of increasing nausea and heartburn okay to increase Reglan, may use as needed Phenergan --Suspect GI symptoms are worsened due to steroid therapy -Continue Protonix, add Carafate  Hypothyroidism Continuehome Synthroid  -Hypokalemia and hypophosphatemia---Repleted: Follow.   SVT/tachyarrhythmia--- tachycardia on admission, since resolved --it was in the setting of electrolyte derangement and persistent profound hypoxia---   Elevated transaminitis AST 58>> 35>>39>.29 ALT 71>>47>>63>>56 Continue to monitor liver panel  Hypoalbuminemia possibly secondary to moderate protein calorie malnutrition Albumin 2.4;this is possibly due to decreased appetite secondary to Covid 19 pneumonia Dietary Consult appreciated  -Continue nutritional supplements  Code Status:Full code Family Communication:daughter Consults called:None   Principal Problem:   Acute respiratory failure with hypoxia ([place]) Active Problems:   History of mitral valve replacement with mechanical valve   Systemic lupus erythematosus (HCC)   Anticoagulation goal of INR 2.5 to 3.5   Gastroparesis   Primary hypothyroidism   Pneumonia due to COVID-19 virus   Hypokalemia   Hypophosphatemia   Transaminitis   Hypoalbuminemia   Acute  respiratory disease due to COVID-19 virus   Protein-calorie malnutrition, severe  Status is: Inpatient  Remains inpatient appropriate because:IV treatments appropriate due to intensity of illness or inability to take PO and Inpatient level of care appropriate due to severity of illness   Dispo: The patient is from: Home  Anticipated d/c is to: Home              Anticipated d/c date is: 3 days              Patient currently is not medically stable to d/c.   Consultants:     Procedures:     Antimicrobials:     Subjective: Pt without specific complaints. No SOB and no chest pain.    Objective: Vitals:   12/02/19 0737 12/02/19 0800 12/02/19 1135 12/02/19 1202  BP:  97/74  116/74  Pulse:  60    Resp:    (!) 22  Temp:  98.7 F (37.1 C)    TempSrc:  Oral    SpO2: 95%  (!) 89% 97%  Weight:      Height:        Intake/Output Summary (Last 24 hours) at 12/02/2019 1231 Last data filed at 12/02/2019 1200 Gross per 24 hour  Intake 360 ml  Output 900 ml  Net -540 ml   Filed Weights   11/30/19 0008  Weight: 66 kg    Examination:  General exam: Appears calm and comfortable  Respiratory system: no increased work of breathing seen. Cardiovascular system: normal S1 & S2 heard. No JVD, murmurs, rubs, gallops or clicks. No pedal edema. Gastrointestinal system: Abdomen is nondistended, soft and nontender. No organomegaly or masses felt. Normal bowel sounds heard. Central nervous system: Alert and oriented. No focal neurological deficits. Extremities: Symmetric 5 x 5 power. Skin: No rashes, lesions or ulcers Psychiatry: Judgement and insight appear normal. Mood & affect appropriate.   Data Reviewed: I have personally reviewed following labs and imaging studies  CBC: Recent Labs  Lab 12/06/2019 2337 11/29/19 0428 11/30/19 0733 12/01/19 0546 12/02/19 0625  WBC 9.1 8.2 13.1* 9.1 12.7*  NEUTROABS 7.8* 7.6 12.2* 8.4* 12.0*  HGB 11.8* 9.1* 9.6* 8.8* 9.2*  HCT  35.4* 27.0* 28.9* 26.9* 27.5*  MCV 91.0 91.2 91.7 92.1 92.6  PLT 322 257 339 262 119    Basic Metabolic Panel: Recent Labs  Lab 11/29/2019 2337 11/28/19 0400 11/29/19 0428 11/30/19 0733 12/01/19 0546 12/02/19 0625  NA 135  --  139 134* 135 135  K 3.4*  --  3.1* 3.6 3.5 4.1  CL 96*  --  103 98 97* 100  CO2 30  --  24 26 28 29   GLUCOSE 97  --  204* 114* 162* 111*  BUN 15  --  16 16 12 13   CREATININE 1.47  --  0.44 0.44 0.42* 0.39*  CALCIUM 8.4*  --  7.0* 7.9* 7.4* 8.1*  MG 2.0  --  2.0 2.3 2.0 2.1  PHOS  --  1.7* 2.3* 3.4 3.3 3.0    GFR: Estimated Creatinine Clearance: 77.8 mL/min (A) (by C-G formula based on SCr of 0.39 mg/dL (L)).  Liver Function Tests: Recent Labs  Lab 11/29/2019 2337 11/29/19 0428 11/30/19 0733 12/01/19 0546 12/02/19 0625  AST 58* 35 39 29 26  ALT 71* 47* 63* 56* 47*  ALKPHOS 90 58 60 59 62  BILITOT 0.9 0.6 0.5 0.3 0.5  PROT 6.6 5.3* 5.5* 5.0* 5.0*  ALBUMIN 2.9* 1.9* 2.4* 2.2* 2.2*    CBG: Recent Labs  Lab 11/30/19 2255 12/01/19 0728  GLUCAP 122* 127*    Recent Results (from the past 240 hour(s))  Culture, blood (Routine X 2) w Reflex to ID Panel     Status: None (Preliminary result)   Collection Time: 11/28/19 12:15 PM  Specimen: BLOOD RIGHT FOREARM  Result Value Ref Range Status   Specimen Description   Final    BLOOD RIGHT FOREARM BOTTLES DRAWN AEROBIC AND ANAEROBIC   Special Requests Blood Culture adequate volume  Final   Culture   Final    NO GROWTH 4 DAYS Performed at [facility], [address], [place], [place] [address]    Report Status PENDING  Incomplete     Radiology Studies: No results found.  Scheduled Meds: . albuterol  2 puff Inhalation QID  . vitamin C  500 mg Oral Daily  . baricitinib  4 mg Oral Daily  . calcium carbonate  1 tablet Oral TID WC  . Chlorhexidine Gluconate Cloth  6 each Topical Daily  . dextromethorphan-guaiFENesin  1 tablet Oral BID  . feeding supplement (ENSURE ENLIVE)  237 mL Oral TID BM    . furosemide  20 mg Oral Daily  . levothyroxine  75 mcg Oral QAC breakfast  . methylPREDNISolone (SOLU-MEDROL) injection  40 mg Intravenous Q12H  . metoCLOPramide  10 mg Oral TID AC  . multivitamin with minerals  1 tablet Oral Daily  . pantoprazole  40 mg Oral BID AC  . polyethylene glycol  17 g Oral Daily  . sucralfate  1 g Oral TID WC & HS  . Warfarin - Pharmacist Dosing Inpatient   Does not apply q1600  . zinc sulfate  220 mg Oral Daily  . zolpidem  5 mg Oral QHS   Continuous Infusions:   LOS: 4 days   Time spent: 35 mins  Adreonna Yontz Wynetta Emery, MD How to contact the [facility] Attending or Consulting provider [place] or covering provider during after hours [place], for this patient?  1. Check the care team in [facility] and look for a) attending/consulting TRH provider listed and b) the [facility] team listed 2. Log into [email or web address] and use [place]'s universal password to access. If you do not have the password, please contact the hospital operator. 3. Locate the [facility] provider you are looking for under [facility] and page to a number that you can be directly reached. 4. If you still have difficulty reaching the provider, please page the [facility] (Director on Call) for the Hospitalists listed on amion for assistance.  12/02/2019, 12:31 PM

## 2019-12-02 NOTE — Progress Notes (Signed)
ANTICOAGULATION CONSULT NOTE -   Pharmacy Consult for:  warfarin dosing Indication:  Mechanical valve  Vital Signs: Temp: 98.7 F (37.1 C) (10/13 0800) Temp Source: Oral (10/13 0800) BP: 116/74 (10/13 1202) Pulse Rate: 60 (10/13 0800)  Labs: Recent Labs    11/30/19 0733 11/30/19 0733 12/01/19 0546 12/02/19 0544 12/02/19 0625  HGB 9.6*   < > 8.8*  --  9.2*  HCT 28.9*  --  26.9*  --  27.5*  PLT 339  --  262  --  253  LABPROT 31.3*  --  42.2* 33.2*  --   INR 3.2*  --  4.6* 3.4*  --   CREATININE 0.44  --  0.42*  --  0.39*  CKTOTAL 56  --   --   --   --    < > = values in this interval not displayed.    Estimated Creatinine Clearance: 77.8 mL/min (A) (by C-G formula based on SCr of 0.39 mg/dL (L)).    Assessment: Pharmacy consulted to dose warfarin  for this 49 yo female on chronic anti-coagulation therapy with warfarin for history of mechanical valve .  Patient was admitted with high INR of 4.1 on 10/7 and took her last dose of warfarin 2.5mg  on 11/26/19.  Patient is currently having nausea, possibly affecting Vit K intake.  Home warfarin dose:  5mg  on Sun/Mon/Wed/Fri and 7.5mg  on Tues/Thurs/Sat Dose recently reduced to 2.5mg  and several doses were held last week.  12/02/19 update INR: 3.4 therapeutic Hb: 9.2       Goal of Therapy:  INR Goal: 2.5-3.5 Monitor platelets by anticoagulation protocol: Yes   Plan:  Coumadin 2.5mg  po x 1 today Daily INR and every other day CBC Monitor for signs and symptoms of bleeding.  Isac Sarna, BS Vena Austria, BCPS Clinical Pharmacist Pager [phone] 12/02/2019,12:29 PM

## 2019-12-02 NOTE — Progress Notes (Signed)
Tried weaning patient off of nonrebreather 3 different occasions during my shift and patient immediately desats to the low 80s high 70s. Pt instructed to lay on side since she cannot tolerate lying on her stomach. This position has been helping keep her sats mid 90s on HF and nonrebreather.

## 2019-12-03 ENCOUNTER — Inpatient Hospital Stay (HOSPITAL_COMMUNITY): Payer: BC Managed Care – PPO

## 2019-12-03 ENCOUNTER — Ambulatory Visit: Payer: BC Managed Care – PPO | Admitting: Gastroenterology

## 2019-12-03 DIAGNOSIS — Z5181 Encounter for therapeutic drug level monitoring: Secondary | ICD-10-CM | POA: Diagnosis not present

## 2019-12-03 DIAGNOSIS — J9601 Acute respiratory failure with hypoxia: Secondary | ICD-10-CM | POA: Diagnosis not present

## 2019-12-03 DIAGNOSIS — K3184 Gastroparesis: Secondary | ICD-10-CM | POA: Diagnosis not present

## 2019-12-03 DIAGNOSIS — U071 COVID-19: Secondary | ICD-10-CM | POA: Diagnosis not present

## 2019-12-03 LAB — CBC WITH DIFFERENTIAL/PLATELET
Abs Immature Granulocytes: 0.28 10*3/uL — ABNORMAL HIGH (ref 0.00–0.07)
Basophils Absolute: 0 10*3/uL (ref 0.0–0.1)
Basophils Relative: 0 %
Eosinophils Absolute: 0 10*3/uL (ref 0.0–0.5)
Eosinophils Relative: 0 %
HCT: 29 % — ABNORMAL LOW (ref 36.0–46.0)
Hemoglobin: 9.4 g/dL — ABNORMAL LOW (ref 12.0–15.0)
Immature Granulocytes: 2 %
Lymphocytes Relative: 2 %
Lymphs Abs: 0.3 10*3/uL — ABNORMAL LOW (ref 0.7–4.0)
MCH: 30 pg (ref 26.0–34.0)
MCHC: 32.4 g/dL (ref 30.0–36.0)
MCV: 92.7 fL (ref 80.0–100.0)
Monocytes Absolute: 0.3 10*3/uL (ref 0.1–1.0)
Monocytes Relative: 2 %
Neutro Abs: 13.5 10*3/uL — ABNORMAL HIGH (ref 1.7–7.7)
Neutrophils Relative %: 94 %
Platelets: 265 10*3/uL (ref 150–400)
RBC: 3.13 MIL/uL — ABNORMAL LOW (ref 3.87–5.11)
RDW: 13.8 % (ref 11.5–15.5)
WBC: 14.4 10*3/uL — ABNORMAL HIGH (ref 4.0–10.5)
nRBC: 0 % (ref 0.0–0.2)

## 2019-12-03 LAB — PROTIME-INR
INR: 3 — ABNORMAL HIGH (ref 0.8–1.2)
Prothrombin Time: 30 seconds — ABNORMAL HIGH (ref 11.4–15.2)

## 2019-12-03 LAB — MAGNESIUM: Magnesium: 2.3 mg/dL (ref 1.7–2.4)

## 2019-12-03 LAB — C-REACTIVE PROTEIN: CRP: 9.8 mg/dL — ABNORMAL HIGH (ref <1.0)

## 2019-12-03 LAB — CULTURE, BLOOD (ROUTINE X 2)
Culture: NO GROWTH
Special Requests: ADEQUATE

## 2019-12-03 LAB — COMPREHENSIVE METABOLIC PANEL
ALT: 44 U/L (ref 0–44)
AST: 32 U/L (ref 15–41)
Albumin: 2.2 g/dL — ABNORMAL LOW (ref 3.5–5.0)
Alkaline Phosphatase: 81 U/L (ref 38–126)
Anion gap: 10 (ref 5–15)
BUN: 18 mg/dL (ref 6–20)
CO2: 27 mmol/L (ref 22–32)
Calcium: 8.1 mg/dL — ABNORMAL LOW (ref 8.9–10.3)
Chloride: 98 mmol/L (ref 98–111)
Creatinine, Ser: 0.45 mg/dL (ref 0.44–1.00)
GFR, Estimated: >60 mL/min (ref >60)
Glucose, Bld: 154 mg/dL — ABNORMAL HIGH (ref 70–99)
Potassium: 3.7 mmol/L (ref 3.5–5.1)
Sodium: 135 mmol/L (ref 135–145)
Total Bilirubin: 0.5 mg/dL (ref 0.3–1.2)
Total Protein: 5.4 g/dL — ABNORMAL LOW (ref 6.5–8.1)

## 2019-12-03 LAB — FERRITIN: Ferritin: 799 ng/mL — ABNORMAL HIGH (ref 11–307)

## 2019-12-03 LAB — PHOSPHORUS: Phosphorus: 3 mg/dL (ref 2.5–4.6)

## 2019-12-03 MED ORDER — ALBUTEROL SULFATE HFA 108 (90 BASE) MCG/ACT IN AERS
2.0000 | INHALATION_SPRAY | Freq: Four times a day (QID) | RESPIRATORY_TRACT | Status: DC
  Administered 2019-12-03 (×2): 2 via RESPIRATORY_TRACT

## 2019-12-03 MED ORDER — AMOXICILLIN-POT CLAVULANATE 875-125 MG PO TABS
1.0000 | ORAL_TABLET | Freq: Two times a day (BID) | ORAL | Status: DC
  Administered 2019-12-03 – 2019-12-04 (×3): 1 via ORAL
  Filled 2019-12-03 (×3): qty 1

## 2019-12-03 MED ORDER — TRAMADOL HCL 50 MG PO TABS
50.0000 mg | ORAL_TABLET | Freq: Four times a day (QID) | ORAL | Status: DC | PRN
  Administered 2019-12-03 – 2019-12-04 (×2): 100 mg via ORAL
  Filled 2019-12-03 (×2): qty 2

## 2019-12-03 MED ORDER — WARFARIN SODIUM 2.5 MG PO TABS
2.5000 mg | ORAL_TABLET | Freq: Once | ORAL | Status: AC
  Administered 2019-12-03: 2.5 mg via ORAL
  Filled 2019-12-03: qty 1

## 2019-12-03 MED ORDER — ALBUTEROL SULFATE HFA 108 (90 BASE) MCG/ACT IN AERS: 2.0000 | INHALATION_SPRAY | Freq: Four times a day (QID) | RESPIRATORY_TRACT | Status: DC

## 2019-12-03 MED ORDER — SALINE SPRAY 0.65 % NA SOLN
1.0000 | NASAL | Status: DC | PRN
  Administered 2019-12-05: 1 via NASAL
  Filled 2019-12-03: qty 44

## 2019-12-03 MED ORDER — SACCHAROMYCES BOULARDII 250 MG PO CAPS
250.0000 mg | ORAL_CAPSULE | Freq: Two times a day (BID) | ORAL | Status: DC
  Administered 2019-12-04 – 2019-12-07 (×6): 250 mg via ORAL
  Filled 2019-12-03 (×8): qty 1

## 2019-12-03 MED ORDER — ALBUTEROL SULFATE HFA 108 (90 BASE) MCG/ACT IN AERS
2.0000 | INHALATION_SPRAY | Freq: Three times a day (TID) | RESPIRATORY_TRACT | Status: DC
  Administered 2019-12-04: 2 via RESPIRATORY_TRACT

## 2019-12-03 NOTE — Progress Notes (Addendum)
ANTICOAGULATION CONSULT NOTE -   Pharmacy Consult for:  warfarin dosing Indication:  Mechanical valve  Vital Signs: Temp: 98 F (36.7 C) (10/14 1306) Temp Source: Oral (10/14 0557) BP: 115/74 (10/14 1306) Pulse Rate: 83 (10/14 1306)  Labs: Recent Labs    12/01/19 0546 12/01/19 0546 12/02/19 0544 12/02/19 0625 12/03/19 0500  HGB 8.8*   < >  --  9.2* 9.4*  HCT 26.9*  --   --  27.5* 29.0*  PLT 262  --   --  253 265  LABPROT 42.2*  --  33.2*  --  30.0*  INR 4.6*  --  3.4*  --  3.0*  CREATININE 0.42*  --   --  0.39* 0.45   < > = values in this interval not displayed.    Estimated Creatinine Clearance: 77.8 mL/min (by C-G formula based on SCr of 0.45 mg/dL).    Assessment: Pharmacy consulted to dose warfarin  for this 49 yo female on chronic anti-coagulation therapy with warfarin for history of mechanical valve .  Patient was admitted with high INR of 4.1 on 10/7 and took her last dose of warfarin 2.5mg  on 11/26/19.  Patient is currently having nausea, possibly affecting Vit K intake.  Home warfarin dose:  5mg  on Sun/Mon/Wed/Fri and 7.5mg  on Tues/Thurs/Sat Dose recently reduced to 2.5mg  x 3 days and several doses were held last week.  12/02/19 update INR: 3.0 therapeutic Hb: 9.4      Goal of Therapy:  INR Goal: 2.5-3.5 Monitor platelets by anticoagulation protocol: Yes   Plan:  Coumadin 2.5mg  po x 1 today Daily INR and every other day CBC Monitor for signs and symptoms of bleeding.  Margot Ables, PharmD Clinical Pharmacist 12/03/2019 1:11 PM

## 2019-12-03 NOTE — Progress Notes (Signed)
PROGRESS NOTE   [name]  VPX:[id number] DOB: 26-Jul-1970 DOA: 12/13/2019 PCP: Sharilyn Sites, MD   Chief Complaint  Patient presents with   Shortness of Breath    Brief Admission History:  49 y.o.femalewith medical history significant forSLE, rheumatoid arthritis, Raynaud's, hypothyroidism, and gastroparesis with persistent nausea as well as mitral valve replacement on chronic anticoagulation and recent COVID-19 infection/pneumonia --- recently discharged home with home O2 on 11/26/19 ---Readmitted on 11/28/2019 with worsening hypoxia and dyspnea and significant tachycardia and tachypnea--- -patient is immunocompromised due to immunosuppression for SLE and rheumatoid arthritis -She was not vaccinated  Assessment & Plan:   Acute respiratory failuredue to progression of Covid pneumonia-readmitted on 11/28/2019 with worsening hypoxia dyspnea and significant tachycardia and tachypnea --despite completing treatment with steroids and remdesivir.  -CTA chest on 11/28/2019 with bilateral groundglassopacities consistent with Covid pneumonia along with elevated pulmonary artery pressures but no evidence of PE  --Dyspnea and hypoxia worsening ---patient with increased oxygen requirements--was requiring up to 15 L of oxygen via nasal cannula PLUS NRB -Procalcitonin is up to 0.34 >> 0.13 -COVID-19 admission protocol -Mucolytics, inhalers as needed -Patient has already completed course of remdesivir on recent hospitalization -Continue bronchodilators and mucolytics -Continue IV Solu-Medrol -as patient clinically is declining with worsening respiratory status, adding baricitinib 10/13  -CXR 10/14 suggesting superimposed bacterial infection, with ongoing high oxygen requirements and increasing WBC and CRP and ferritin she is being covered for bacterial pneumonia with Augmentin 875 mg BID x 4 days.  Florastor 250 mg BID x 5 days.   Rheumatoid arthritis, SLE, immunosuppression-stable. - Hold  rituximab -Currently on steroids for Covid pneumonia -As she has been off her immunosuppressants for a while,patient should discuss with herrheumatologist on discharge if she can get the Covid vaccine in the near future  Mitral valve replacement with mechanical valve, supratherapeutic INR Her anticoagulation goal INR is 2.5-3.5.  Hemoglobin is stable no sign of bleeding. -INR -was recently supratherapeutic, currently 4.6 -Patient had an episode of nosebleed on 11/29/2019 -Pharmacy to manage warfarin  Acute on chronic anemia--hemoglobin is down to 8.8 from a baseline around 11- -patient is an episode of nosebleed on 11/29/2019 -Monitor closely  -Possible UTI--- UA from 11/29/2019 suspicious for UTI, urine culture was not obtained,  patient was empirically treated with  IV Rocephin for total of 3 days -Patient had fever on presentation on 11/28/2019, no further fevers  Gastroparesis/GERD Complains of increasing nausea and heartburn okay to increase Reglan, may use as needed Phenergan --Suspect GI symptoms are worsened due to steroid therapy -Continue Protonix, add Carafate  Hypothyroidism Continuehome Synthroid  -Hypokalemia and hypophosphatemia---Repleted: Follow.   SVT/tachyarrhythmia--- tachycardia on admission, since resolved --it was in the setting of electrolyte derangement and persistent profound hypoxia---   Elevated transaminitis AST 58>> 35>>39>.29 ALT 71>>47>>63>>56 Continue to monitor liver panel  Hypoalbuminemia possibly secondary to moderate protein calorie malnutrition Albumin 2.4;this is possibly due to decreased appetite secondary to Covid 19 pneumonia Dietary Consult appreciated  -Continue nutritional supplements  Code Status:Full code Family Communication:daughter Consults called:None   Principal Problem:   Acute respiratory failure with hypoxia ([place]) Active Problems:   History of mitral valve replacement with mechanical valve    Systemic lupus erythematosus (HCC)   Anticoagulation goal of INR 2.5 to 3.5   Gastroparesis   Primary hypothyroidism   Pneumonia due to COVID-19 virus   Hypokalemia   Hypophosphatemia   Transaminitis   Hypoalbuminemia   Acute respiratory disease due to COVID-19 virus   Protein-calorie malnutrition, severe  Status is: Inpatient  Remains inpatient appropriate because:IV treatments appropriate due to intensity of illness or inability to take PO and Inpatient level of care appropriate due to severity of illness   Dispo: The patient is from: Home              Anticipated d/c is to: Home              Anticipated d/c date is: > 3 days              Patient currently is not medically stable to d/c. Consultants:     Procedures:     Antimicrobials:     Subjective: Pt without specific complaints. No SOB and no chest pain.    Objective: Vitals:   12/02/19 2046 12/02/19 2118 12/03/19 0557 12/03/19 1306  BP:  101/67 100/75 115/74  Pulse:   70 83  Resp:   20 20  Temp:   98 F (36.7 C) 98 F (36.7 C)  TempSrc:   Oral   SpO2: 93%  92% 90%  Weight:      Height:        Intake/Output Summary (Last 24 hours) at 12/03/2019 1343 Last data filed at 12/03/2019 1300 Gross per 24 hour  Intake 1295 ml  Output 1050 ml  Net 245 ml   Filed Weights   11/30/19 0008  Weight: 66 kg    Examination:  General exam: Appears calm and comfortable  Respiratory system: no increased work of breathing seen. Shallow BS bilateral.  Cardiovascular system: normal S1 & S2 heard. No JVD, murmurs, rubs, gallops or clicks. No pedal edema. Gastrointestinal system: Abdomen is nondistended, soft and nontender. No organomegaly or masses felt. Normal bowel sounds heard. Central nervous system: Alert and oriented. No focal neurological deficits. Extremities: Symmetric 5 x 5 power. Skin: No rashes, lesions or ulcers Psychiatry: Judgement and insight appear normal. Mood & affect appropriate.   Data  Reviewed: I have personally reviewed following labs and imaging studies  CBC: Recent Labs  Lab 11/29/19 0428 11/30/19 0733 12/01/19 0546 12/02/19 0625 12/03/19 0500  WBC 8.2 13.1* 9.1 12.7* 14.4*  NEUTROABS 7.6 12.2* 8.4* 12.0* 13.5*  HGB 9.1* 9.6* 8.8* 9.2* 9.4*  HCT 27.0* 28.9* 26.9* 27.5* 29.0*  MCV 91.2 91.7 92.1 92.6 92.7  PLT 257 339 262 253 431    Basic Metabolic Panel: Recent Labs  Lab 11/29/19 0428 11/30/19 0733 12/01/19 0546 12/02/19 0625 12/03/19 0500  NA 139 134* 135 135 135  K 3.1* 3.6 3.5 4.1 3.7  CL 103 98 97* 100 98  CO2 24 26 28 29 27   GLUCOSE 204* 114* 162* 111* 154*  BUN 16 16 12 13 18   CREATININE 0.44 0.44 0.42* 0.39* 0.45  CALCIUM 7.0* 7.9* 7.4* 8.1* 8.1*  MG 2.0 2.3 2.0 2.1 2.3  PHOS 2.3* 3.4 3.3 3.0 3.0    GFR: Estimated Creatinine Clearance: 77.8 mL/min (by C-G formula based on SCr of 0.45 mg/dL).  Liver Function Tests: Recent Labs  Lab 11/29/19 0428 11/30/19 0733 12/01/19 0546 12/02/19 0625 12/03/19 0500  AST 35 39 29 26 32  ALT 47* 63* 56* 47* 44  ALKPHOS 58 60 59 62 81  BILITOT 0.6 0.5 0.3 0.5 0.5  PROT 5.3* 5.5* 5.0* 5.0* 5.4*  ALBUMIN 1.9* 2.4* 2.2* 2.2* 2.2*    CBG: Recent Labs  Lab 11/30/19 2255 12/01/19 0728  GLUCAP 122* 127*    Recent Results (from the past 240 hour(s))  Culture, blood (Routine X 2)  w Reflex to ID Panel     Status: None   Collection Time: 11/28/19 12:15 PM   Specimen: BLOOD RIGHT FOREARM  Result Value Ref Range Status   Specimen Description   Final    BLOOD RIGHT FOREARM BOTTLES DRAWN AEROBIC AND ANAEROBIC   Special Requests Blood Culture adequate volume  Final   Culture   Final    NO GROWTH 5 DAYS Performed at [facility], [address], [place], [place] [address]    Report Status 12/03/2019 FINAL  Final     Radiology Studies: DG CHEST PORT 1 VIEW  Result Date: 12/03/2019 CLINICAL DATA:  Shortness of breath.  Reported COVID-19 positive. EXAM: PORTABLE CHEST 1 VIEW COMPARISON:   November 28, 2019 FINDINGS: There is extensive airspace opacity throughout the mid and lower lung regions as well as in the right upper lobe. There is consolidation throughout portions of the left mid and lower lung regions. Heart is upper normal in size with pulmonary vascularity normal. Central catheter tip is in the superior vena cava. Apparent pacemaker wires attached to right heart. No pneumothorax. No bone lesions. No appreciable adenopathy. IMPRESSION: Multifocal pneumonia with an increase in opacity in the right upper lobe compared to most recent study. Relative consolidation noted in the left lower lung region. Stable cardiac silhouette. No adenopathy appreciable. Appearance overall indicative of atypical organism pneumonia. The consolidation in the left lower lobe potentially could represent a degree of bacterial superinfection. Electronically Signed   By: Lowella Grip III M.D.   On: 12/03/2019 09:14    Scheduled Meds:  acetaminophen  650 mg Oral Q6H   albuterol  2 puff Inhalation Q6H   amoxicillin-clavulanate  1 tablet Oral Q12H   vitamin C  500 mg Oral Daily   baricitinib  4 mg Oral Daily   calcium carbonate  1 tablet Oral TID WC   Chlorhexidine Gluconate Cloth  6 each Topical Daily   dextromethorphan-guaiFENesin  1 tablet Oral BID   feeding supplement  237 mL Oral TID BM   furosemide  20 mg Oral Daily   levothyroxine  75 mcg Oral QAC breakfast   methylPREDNISolone (SOLU-MEDROL) injection  40 mg Intravenous Q12H   metoCLOPramide  10 mg Oral TID AC   multivitamin with minerals  1 tablet Oral Daily   pantoprazole  40 mg Oral BID AC   polyethylene glycol  17 g Oral Daily   [START ON 12/04/2019] saccharomyces boulardii  250 mg Oral BID   sucralfate  1 g Oral TID WC & HS   warfarin  2.5 mg Oral ONCE-1600   Warfarin - Pharmacist Dosing Inpatient   Does not apply q1600   zinc sulfate  220 mg Oral Daily   zolpidem  5 mg Oral QHS   Continuous Infusions:   LOS:  5 days   Time spent: 33 mins  Bailynn Dyk Wynetta Emery, MD How to contact the [facility] Attending or Consulting provider [place] or covering provider during after hours [place], for this patient?  1. Check the care team in [facility] and look for a) attending/consulting TRH provider listed and b) the [facility] team listed 2. Log into [email or web address] and use [place]'s universal password to access. If you do not have the password, please contact the hospital operator. 3. Locate the [facility] [address] provider you are looking for under [facility] and page to a number that you can be directly reached. 4. If you still have difficulty reaching the provider, please page the [facility] (Director on  Call) for the Hospitalists listed on amion for assistance.  12/03/2019, 1:43 PM

## 2019-12-04 ENCOUNTER — Inpatient Hospital Stay (HOSPITAL_COMMUNITY): Payer: BC Managed Care – PPO

## 2019-12-04 DIAGNOSIS — I471 Supraventricular tachycardia, unspecified: Secondary | ICD-10-CM

## 2019-12-04 DIAGNOSIS — Z5181 Encounter for therapeutic drug level monitoring: Secondary | ICD-10-CM | POA: Diagnosis not present

## 2019-12-04 DIAGNOSIS — J9601 Acute respiratory failure with hypoxia: Secondary | ICD-10-CM

## 2019-12-04 DIAGNOSIS — J189 Pneumonia, unspecified organism: Secondary | ICD-10-CM | POA: Diagnosis not present

## 2019-12-04 DIAGNOSIS — U071 COVID-19: Secondary | ICD-10-CM | POA: Diagnosis not present

## 2019-12-04 DIAGNOSIS — Z952 Presence of prosthetic heart valve: Secondary | ICD-10-CM

## 2019-12-04 DIAGNOSIS — J1282 Pneumonia due to Coronavirus disease 2019: Secondary | ICD-10-CM | POA: Diagnosis not present

## 2019-12-04 LAB — D-DIMER, QUANTITATIVE: D-Dimer, Quant: 4.77 ug/mL-FEU — ABNORMAL HIGH (ref 0.00–0.50)

## 2019-12-04 LAB — GLUCOSE, CAPILLARY
Glucose-Capillary: 150 mg/dL — ABNORMAL HIGH (ref 70–99)
Glucose-Capillary: 155 mg/dL — ABNORMAL HIGH (ref 70–99)
Glucose-Capillary: 97 mg/dL (ref 70–99)

## 2019-12-04 LAB — CBC WITH DIFFERENTIAL/PLATELET
Abs Immature Granulocytes: 0.45 10*3/uL — ABNORMAL HIGH (ref 0.00–0.07)
Basophils Absolute: 0 10*3/uL (ref 0.0–0.1)
Basophils Relative: 0 %
Eosinophils Absolute: 0 10*3/uL (ref 0.0–0.5)
Eosinophils Relative: 0 %
HCT: 28.6 % — ABNORMAL LOW (ref 36.0–46.0)
Hemoglobin: 9.1 g/dL — ABNORMAL LOW (ref 12.0–15.0)
Immature Granulocytes: 3 %
Lymphocytes Relative: 2 %
Lymphs Abs: 0.2 10*3/uL — ABNORMAL LOW (ref 0.7–4.0)
MCH: 29.6 pg (ref 26.0–34.0)
MCHC: 31.8 g/dL (ref 30.0–36.0)
MCV: 93.2 fL (ref 80.0–100.0)
Monocytes Absolute: 0.3 10*3/uL (ref 0.1–1.0)
Monocytes Relative: 2 %
Neutro Abs: 14.5 10*3/uL — ABNORMAL HIGH (ref 1.7–7.7)
Neutrophils Relative %: 93 %
Platelets: 267 10*3/uL (ref 150–400)
RBC: 3.07 MIL/uL — ABNORMAL LOW (ref 3.87–5.11)
RDW: 14.2 % (ref 11.5–15.5)
WBC: 15.5 10*3/uL — ABNORMAL HIGH (ref 4.0–10.5)
nRBC: 0 % (ref 0.0–0.2)

## 2019-12-04 LAB — BASIC METABOLIC PANEL
Anion gap: 8 (ref 5–15)
BUN: 17 mg/dL (ref 6–20)
CO2: 26 mmol/L (ref 22–32)
Calcium: 7.8 mg/dL — ABNORMAL LOW (ref 8.9–10.3)
Chloride: 96 mmol/L — ABNORMAL LOW (ref 98–111)
Creatinine, Ser: 0.42 mg/dL — ABNORMAL LOW (ref 0.44–1.00)
GFR, Estimated: >60 mL/min (ref >60)
Glucose, Bld: 120 mg/dL — ABNORMAL HIGH (ref 70–99)
Potassium: 3.8 mmol/L (ref 3.5–5.1)
Sodium: 130 mmol/L — ABNORMAL LOW (ref 135–145)

## 2019-12-04 LAB — COMPREHENSIVE METABOLIC PANEL
ALT: 75 U/L — ABNORMAL HIGH (ref 0–44)
AST: 43 U/L — ABNORMAL HIGH (ref 15–41)
Albumin: 2 g/dL — ABNORMAL LOW (ref 3.5–5.0)
Alkaline Phosphatase: 86 U/L (ref 38–126)
Anion gap: 8 (ref 5–15)
BUN: 17 mg/dL (ref 6–20)
CO2: 30 mmol/L (ref 22–32)
Calcium: 7.9 mg/dL — ABNORMAL LOW (ref 8.9–10.3)
Chloride: 95 mmol/L — ABNORMAL LOW (ref 98–111)
Creatinine, Ser: 0.43 mg/dL — ABNORMAL LOW (ref 0.44–1.00)
GFR, Estimated: >60 mL/min (ref >60)
Glucose, Bld: 127 mg/dL — ABNORMAL HIGH (ref 70–99)
Potassium: 3.7 mmol/L (ref 3.5–5.1)
Sodium: 133 mmol/L — ABNORMAL LOW (ref 135–145)
Total Bilirubin: 0.5 mg/dL (ref 0.3–1.2)
Total Protein: 4.9 g/dL — ABNORMAL LOW (ref 6.5–8.1)

## 2019-12-04 LAB — MAGNESIUM
Magnesium: 2 mg/dL (ref 1.7–2.4)
Magnesium: 1.8 mg/dL (ref 1.7–2.4)

## 2019-12-04 LAB — MRSA PCR SCREENING: MRSA by PCR: NEGATIVE

## 2019-12-04 LAB — FERRITIN: Ferritin: 1375 ng/mL — ABNORMAL HIGH (ref 11–307)

## 2019-12-04 LAB — PROTIME-INR
INR: 3.7 — ABNORMAL HIGH (ref 0.8–1.2)
Prothrombin Time: 35.8 seconds — ABNORMAL HIGH (ref 11.4–15.2)

## 2019-12-04 LAB — C-REACTIVE PROTEIN: CRP: 7.7 mg/dL — ABNORMAL HIGH (ref <1.0)

## 2019-12-04 MED ORDER — METOPROLOL TARTRATE 25 MG PO TABS: 12.5000 mg | ORAL_TABLET | Freq: Two times a day (BID) | ORAL | Status: DC

## 2019-12-04 MED ORDER — ACETAMINOPHEN 325 MG PO TABS: 650.0000 mg | ORAL_TABLET | Freq: Four times a day (QID) | ORAL | Status: DC | PRN

## 2019-12-04 MED ORDER — METHYLPREDNISOLONE SODIUM SUCC 125 MG IJ SOLR
60.0000 mg | Freq: Four times a day (QID) | INTRAMUSCULAR | Status: DC
  Administered 2019-12-04 – 2019-12-07 (×12): 60 mg via INTRAVENOUS
  Filled 2019-12-04 (×12): qty 2

## 2019-12-04 MED ORDER — POTASSIUM CHLORIDE CRYS ER 20 MEQ PO TBCR: 40.0000 meq | EXTENDED_RELEASE_TABLET | Freq: Once | ORAL | Status: DC

## 2019-12-04 MED ORDER — ADENOSINE 6 MG/2ML IV SOLN
INTRAVENOUS | Status: AC
  Filled 2019-12-04: qty 4

## 2019-12-04 MED ORDER — SODIUM CHLORIDE 0.9 % IV SOLN
2.0000 g | Freq: Three times a day (TID) | INTRAVENOUS | Status: DC
  Administered 2019-12-04 – 2019-12-06 (×8): 2 g via INTRAVENOUS
  Filled 2019-12-04 (×8): qty 2

## 2019-12-04 MED ORDER — METOPROLOL TARTRATE 25 MG PO TABS
12.5000 mg | ORAL_TABLET | Freq: Two times a day (BID) | ORAL | Status: DC
  Administered 2019-12-04 – 2019-12-05 (×2): 12.5 mg via ORAL
  Filled 2019-12-04 (×3): qty 1

## 2019-12-04 MED ORDER — POTASSIUM CHLORIDE CRYS ER 20 MEQ PO TBCR
40.0000 meq | EXTENDED_RELEASE_TABLET | Freq: Once | ORAL | Status: DC
  Filled 2019-12-04 (×2): qty 2

## 2019-12-04 MED ORDER — LEVALBUTEROL HCL 0.63 MG/3ML IN NEBU
0.6300 mg | INHALATION_SOLUTION | Freq: Four times a day (QID) | RESPIRATORY_TRACT | Status: DC | PRN
  Administered 2019-12-06: 0.63 mg via RESPIRATORY_TRACT
  Filled 2019-12-04: qty 3

## 2019-12-04 MED ORDER — VANCOMYCIN HCL 1250 MG/250ML IV SOLN
1250.0000 mg | INTRAVENOUS | Status: DC
Start: 2019-12-05 — End: 2019-12-06
  Administered 2019-12-05: 1250 mg via INTRAVENOUS
  Filled 2019-12-04: qty 250

## 2019-12-04 MED ORDER — ADENOSINE 6 MG/2ML IV SOLN
INTRAVENOUS | Status: AC
  Administered 2019-12-04: 12 mg
  Filled 2019-12-04: qty 10

## 2019-12-04 MED ORDER — INSULIN ASPART 100 UNIT/ML ~~LOC~~ SOLN
0.0000 [IU] | SUBCUTANEOUS | Status: DC
  Administered 2019-12-04: 3 [IU] via SUBCUTANEOUS
  Administered 2019-12-05: 4 [IU] via SUBCUTANEOUS
  Administered 2019-12-05: 3 [IU] via SUBCUTANEOUS
  Administered 2019-12-05: 4 [IU] via SUBCUTANEOUS

## 2019-12-04 MED ORDER — SODIUM CHLORIDE 0.9 % IV SOLN
200.0000 mg | Freq: Once | INTRAVENOUS | Status: AC
  Administered 2019-12-04: 200 mg via INTRAVENOUS
  Filled 2019-12-04: qty 200

## 2019-12-04 MED ORDER — SODIUM CHLORIDE 0.9 % IV SOLN
100.0000 mg | INTRAVENOUS | Status: DC
  Administered 2019-12-05 – 2019-12-06 (×2): 100 mg via INTRAVENOUS
  Filled 2019-12-04 (×5): qty 100

## 2019-12-04 MED ORDER — POTASSIUM CHLORIDE CRYS ER 10 MEQ PO TBCR
10.0000 meq | EXTENDED_RELEASE_TABLET | Freq: Every day | ORAL | Status: DC
  Administered 2019-12-05 – 2019-12-06 (×2): 10 meq via ORAL
  Filled 2019-12-04 (×2): qty 1

## 2019-12-04 MED ORDER — FUROSEMIDE 10 MG/ML IJ SOLN
20.0000 mg | Freq: Once | INTRAMUSCULAR | Status: AC
  Administered 2019-12-04: 20 mg via INTRAVENOUS
  Filled 2019-12-04: qty 2

## 2019-12-04 MED ORDER — DILTIAZEM HCL-DEXTROSE 125-5 MG/125ML-% IV SOLN (PREMIX)
5.0000 mg/h | INTRAVENOUS | Status: DC
  Administered 2019-12-04 (×2): 5 mg/h via INTRAVENOUS
  Administered 2019-12-06: 4 mg/h via INTRAVENOUS
  Filled 2019-12-04 (×2): qty 125

## 2019-12-04 MED ORDER — VANCOMYCIN HCL 1500 MG/300ML IV SOLN
1500.0000 mg | Freq: Once | INTRAVENOUS | Status: AC
  Administered 2019-12-04: 1500 mg via INTRAVENOUS
  Filled 2019-12-04: qty 300

## 2019-12-04 MED ORDER — ADENOSINE 6 MG/2ML IV SOLN
INTRAVENOUS | Status: AC
  Administered 2019-12-04: 12 mg
  Filled 2019-12-04: qty 4

## 2019-12-04 NOTE — Progress Notes (Signed)
PROGRESS NOTE   [name]  YPP:[id number] DOB: 1970/09/12 DOA: 12/02/2019 PCP: Sharilyn Sites, MD   Chief Complaint  Patient presents with   Shortness of Breath    Brief Admission History:  49 y.o.femalewith medical history significant forSLE, rheumatoid arthritis, Raynaud's, hypothyroidism, and gastroparesis with persistent nausea as well as mitral valve replacement on chronic anticoagulation and recent COVID-19 infection/pneumonia --- recently discharged home with home O2 on 11/26/19 ---Readmitted on 11/28/2019 with worsening hypoxia and dyspnea and significant tachycardia and tachypnea--- -patient is immunocompromised due to immunosuppression for SLE and rheumatoid arthritis -She was not vaccinated  Assessment & Plan:   Acute respiratory failuredue to progression of Covid pneumonia-readmitted on 11/28/2019 with worsening hypoxia dyspnea and significant tachycardia and tachypnea --despite completing treatment with steroids and remdesivir.  -CTA chest on 11/28/2019 with bilateral groundglassopacities consistent with Covid pneumonia along with elevated pulmonary artery pressures but no evidence of PE  -Dyspnea and hypoxia worsening ---patient with increased oxygen requirements--was requiring up to 15 L of oxygen via nasal cannula PLUS NRB -Procalcitonin is up to 0.34 >> 0.13 -COVID-19 admission protocol -Mucolytics, inhalers as needed -Patient has already completed course of remdesivir on recent hospitalization -Continue bronchodilators and mucolytics -Continue IV Solu-Medrol -as patient clinically is declining with worsening respiratory status, added baricitinib 10/13  -CXR 10/14 suggesting superimposed bacterial infection, with ongoing high oxygen requirements and increasing WBC and CRP and ferritin she is being covered for bacterial pneumonia with Augmentin 875 mg BID x 4 days.  Florastor 250 mg BID x 5 days.   Rheumatoid arthritis, SLE, immunosuppression-stable. -Hold  rituximab -Currently on steroids for Covid pneumonia -As she has been off her immunosuppressants for a while,patient should discuss with herrheumatologist on discharge if she can get the Covid vaccine in the near future  Mitral valve replacement with mechanical valve, supratherapeutic INR Her anticoagulation goal INR is 2.5-3.5.  Hemoglobin is stable no sign of bleeding. -INR -was recently supratherapeutic, currently 4.6 -Patient had an episode of nosebleed on 11/29/2019 -Pharmacy to manage warfarin  Acute on chronic anemia--hemoglobin is down to 8.8 from a baseline around 11- -patient is an episode of nosebleed on 11/29/2019 -Monitor closely  -Possible UTI--- UA from 11/29/2019 suspicious for UTI, urine culture was not obtained,  patient was empirically treated with  IV Rocephin for total of 3 days -Patient had fever on presentation on 11/28/2019, no further fevers  Gastroparesis/GERD Complains of increasing nausea and heartburn okay to increase Reglan, may use as needed Phenergan --Suspect GI symptoms are worsened due to steroid therapy -Continue Protonix, add Carafate  Hypothyroidism Continuehome Synthroid  -Hypokalemia and hypophosphatemia---Repleted: Follow.   SVT/tachyarrhythmia--- tachycardia on admission - on 10/15 Pt developed recurrent SVT HR 220s, she was given several doses of adenosine but SVT rapidly returned, started on low dose cardizem with return of SVT, cardiology consulted Dr Harrington Challenger at bedside, given bolus dose of cardizem and IV digoxin and Dr. Harrington Challenger spoke with Dr. Halford Chessman and recommended patient transfer to ICU at [facility].  Dr. Harrington Challenger says there are 4 ICU beds available now at [facility].  Family asked for transfer to [facility]. Spoke with transfer line and they informed us that patient would have to be wait-listed. Family then decided that they wanted her to go to [facility].  Dr. Harrington Challenger informed me that Dr. Halford Chessman was coming to see patient.     Elevated transaminitis AST 58>>  35>>39>.29 ALT 71>>47>>63>>56 Continue to monitor liver panel  Hypoalbuminemia possibly secondary to moderate protein calorie malnutrition Albumin 2.4;this is  possibly due to decreased appetite secondary to Covid 19 pneumonia Dietary Consult appreciated  -Continue nutritional supplements  Code Status:Full code Family Communication:daughters at bedside  Consults called:cardiology and pulmonology (intensivist)   Principal Problem:   Acute respiratory failure with hypoxia ([place]) Active Problems:   History of mitral valve replacement with mechanical valve   Systemic lupus erythematosus (HCC)   Anticoagulation goal of INR 2.5 to 3.5   Gastroparesis   Primary hypothyroidism   Pneumonia due to COVID-19 virus   Hypokalemia   Hypophosphatemia   Transaminitis   Hypoalbuminemia   Acute respiratory disease due to COVID-19 virus   Protein-calorie malnutrition, severe   COVID-19 virus infection   SVT (supraventricular tachycardia) ([place])  Status is: Inpatient  Remains inpatient appropriate because:IV treatments appropriate due to intensity of illness or inability to take PO and Inpatient level of care appropriate due to severity of illness   Dispo: The patient is from: Home              Anticipated d/c is to: Home              Anticipated d/c date is: > 3 days              Patient currently is not medically stable to d/c. Consultants:   Dr. Harrington Challenger cardiology  Dr. Halford Chessman pulmonology intensivist  Procedures:     Antimicrobials:     Subjective: Pt developed worsening SOB and developed recurrent SVT, denies chest pain.      Objective: Vitals:   12/03/19 2150 12/04/19 0200 12/04/19 0809 12/04/19 1106  BP: 111/80 122/75  (!) 124/57  Pulse: 80 85    Resp: 19 (!) 22    Temp: 97.6 F (36.4 C) 98 F (36.7 C)    TempSrc:  Axillary    SpO2: 91% 91% (!) 87%   Weight:      Height:        Intake/Output Summary (Last 24 hours) at 12/04/2019 1159 Last data filed at  12/03/2019 1700 Gross per 24 hour  Intake 1055 ml  Output --  Net 1055 ml   Filed Weights   11/30/19 0008  Weight: 66 kg    Examination:  General exam: Pt appears weak and frail.  Respiratory system: no increased work of breathing seen. Shallow BS bilateral.  Cardiovascular system: normal S1 & S2 heard. No JVD, murmurs, rubs, gallops or clicks. No pedal edema. Gastrointestinal system: Abdomen is nondistended, soft and nontender. No organomegaly or masses felt. Normal bowel sounds heard. Central nervous system: Alert and oriented. No focal neurological deficits. Extremities: Symmetric 5 x 5 power. Skin: No rashes, lesions or ulcers Psychiatry: Judgement and insight appear normal. Mood & affect appropriate.   Data Reviewed: I have personally reviewed following labs and imaging studies  CBC: Recent Labs  Lab 11/30/19 0733 12/01/19 0546 12/02/19 0625 12/03/19 0500 12/04/19 0443  WBC 13.1* 9.1 12.7* 14.4* 15.5*  NEUTROABS 12.2* 8.4* 12.0* 13.5* 14.5*  HGB 9.6* 8.8* 9.2* 9.4* 9.1*  HCT 28.9* 26.9* 27.5* 29.0* 28.6*  MCV 91.7 92.1 92.6 92.7 93.2  PLT 339 262 253 265 144    Basic Metabolic Panel: Recent Labs  Lab 11/29/19 0428 11/29/19 0428 11/30/19 0733 12/01/19 0546 12/02/19 0625 12/03/19 0500 12/04/19 0443  NA 139   < > 134* 135 135 135 133*  K 3.1*   < > 3.6 3.5 4.1 3.7 3.7  CL 103   < > 98 97* 100 98 95*  CO2 24   < >  26 28 29 27 30   GLUCOSE 204*   < > 114* 162* 111* 154* 127*  BUN 16   < > 16 12 13 18 17   CREATININE 0.44   < > 0.44 0.42* 0.39* 0.45 0.43*  CALCIUM 7.0*   < > 7.9* 7.4* 8.1* 8.1* 7.9*  MG 2.0   < > 2.3 2.0 2.1 2.3 2.0  PHOS 2.3*  --  3.4 3.3 3.0 3.0  --    < > = values in this interval not displayed.    GFR: Estimated Creatinine Clearance: 77.8 mL/min (A) (by C-G formula based on SCr of 0.43 mg/dL (L)).  Liver Function Tests: Recent Labs  Lab 11/30/19 0733 12/01/19 0546 12/02/19 0625 12/03/19 0500 12/04/19 0443  AST 39 29 26 32 43*   ALT 63* 56* 47* 44 75*  ALKPHOS 60 59 62 81 86  BILITOT 0.5 0.3 0.5 0.5 0.5  PROT 5.5* 5.0* 5.0* 5.4* 4.9*  ALBUMIN 2.4* 2.2* 2.2* 2.2* 2.0*    CBG: Recent Labs  Lab 11/30/19 2255 12/01/19 0728  GLUCAP 122* 127*    Recent Results (from the past 240 hour(s))  Culture, blood (Routine X 2) w Reflex to ID Panel     Status: None   Collection Time: 11/28/19 12:15 PM   Specimen: BLOOD RIGHT FOREARM  Result Value Ref Range Status   Specimen Description   Final    BLOOD RIGHT FOREARM BOTTLES DRAWN AEROBIC AND ANAEROBIC   Special Requests Blood Culture adequate volume  Final   Culture   Final    NO GROWTH 5 DAYS Performed at [facility], [address], [place], [place] [address]    Report Status 12/03/2019 FINAL  Final     Radiology Studies: DG CHEST PORT 1 VIEW  Result Date: 12/03/2019 CLINICAL DATA:  Shortness of breath.  Reported COVID-19 positive. EXAM: PORTABLE CHEST 1 VIEW COMPARISON:  November 28, 2019 FINDINGS: There is extensive airspace opacity throughout the mid and lower lung regions as well as in the right upper lobe. There is consolidation throughout portions of the left mid and lower lung regions. Heart is upper normal in size with pulmonary vascularity normal. Central catheter tip is in the superior vena cava. Apparent pacemaker wires attached to right heart. No pneumothorax. No bone lesions. No appreciable adenopathy. IMPRESSION: Multifocal pneumonia with an increase in opacity in the right upper lobe compared to most recent study. Relative consolidation noted in the left lower lung region. Stable cardiac silhouette. No adenopathy appreciable. Appearance overall indicative of atypical organism pneumonia. The consolidation in the left lower lobe potentially could represent a degree of bacterial superinfection. Electronically Signed   By: Lowella Grip III M.D.   On: 12/03/2019 09:14    Scheduled Meds:  acetaminophen  650 mg Oral Q6H   albuterol  2 puff Inhalation  TID   amoxicillin-clavulanate  1 tablet Oral Q12H   vitamin C  500 mg Oral Daily   baricitinib  4 mg Oral Daily   Chlorhexidine Gluconate Cloth  6 each Topical Daily   dextromethorphan-guaiFENesin  1 tablet Oral BID   feeding supplement  237 mL Oral TID BM   furosemide  20 mg Oral Daily   levothyroxine  75 mcg Oral QAC breakfast   methylPREDNISolone (SOLU-MEDROL) injection  40 mg Intravenous Q12H   metoCLOPramide  10 mg Oral TID AC   metoprolol tartrate  12.5 mg Oral BID   multivitamin with minerals  1 tablet Oral Daily   pantoprazole  40  mg Oral BID AC   polyethylene glycol  17 g Oral Daily   [START ON 12/05/2019] potassium chloride  10 mEq Oral Daily   potassium chloride  40 mEq Oral Once   saccharomyces boulardii  250 mg Oral BID   sucralfate  1 g Oral TID WC & HS   Warfarin - Pharmacist Dosing Inpatient   Does not apply q1600   zinc sulfate  220 mg Oral Daily   zolpidem  5 mg Oral QHS   Continuous Infusions:   LOS: 6 days   Critical Care Procedure Note Authorized and Performed by: Murvin Natal MD  Total Critical Care time:  60 mins Due to a high probability of clinically significant, life threatening deterioration, the patient required my highest level of preparedness to intervene emergently and I personally spent this critical care time directly and personally managing the patient.  This critical care time included obtaining a history; examining the patient, pulse oximetry; ordering and review of studies; arranging urgent treatment with development of a management plan; evaluation of patient's response of treatment; frequent reassessment; and discussions with other providers.  This critical care time was performed to assess and manage the high probability of imminent and life threatening deterioration that could result in multi-organ failure.  It was exclusive of separately billable procedures and treating other patients and teaching time.    Irwin Brakeman,  MD How to contact the [facility] Attending or Consulting provider [place] or covering provider during after hours [place], for this patient?  1. Check the care team in [facility] and look for a) attending/consulting TRH provider listed and b) the [facility] team listed 2. Log into [email or web address] and use [facility]'s universal password to access. If you do not have the password, please contact the hospital operator. 3. Locate the [facility] provider you are looking for under [facility] and page to a number that you can be directly reached. 4. If you still have difficulty reaching the provider, please page the [facility] (Director on Call) for the Hospitalists listed on amion for assistance.  12/04/2019, 11:59 AM

## 2019-12-04 NOTE — Progress Notes (Signed)
Around 1115 central telemetry called and told me that patient was in [place]. Rapid response was called. See Rapid Responses' nurse for details. Patient then transferred to ICU.

## 2019-12-04 NOTE — Progress Notes (Signed)
Rapid Response Event Note   Reason for Call :  Cardiac telemetry alerted primary RN that the patient's heart rate was Vtach with at rate in the 200s. Rapid response was initiated.  Initial Focused Assessment:   Patient found alert/oriented, lying in bed. SHOB noted, but patient is post COVID with lingering hypoxia requiring increased oxygen demands. EKG obtained, SVT noted. Vital signs within patient normal for hospital course. HR noted to be 215   Interventions:  EKG obtained 1120 Adenosine 6 mg given 1123 with successful conversion of SVT to sinus tach.   Plan of Care:  Transfer to ICU. Patient transferred to ICU by Rapid response RN and Larene Beach, RN.   MD Notified: Dr Wynetta Emery  Call Time: [place] Time: 1120 End Time: [place]  Lynne Logan, RN

## 2019-12-04 NOTE — Progress Notes (Signed)
ANTICOAGULATION CONSULT NOTE -   Pharmacy Consult for:  warfarin dosing Indication:  Mechanical valve  Vital Signs: Temp: 98 F (36.7 C) (10/15 0200) Temp Source: Axillary (10/15 0200) BP: 124/57 (10/15 1106) Pulse Rate: 85 (10/15 0200)  Labs: Recent Labs    12/02/19 0544 12/02/19 0625 12/02/19 0625 12/03/19 0500 12/04/19 0443  HGB  --  9.2*   < > 9.4* 9.1*  HCT  --  27.5*  --  29.0* 28.6*  PLT  --  253  --  265 267  LABPROT 33.2*  --   --  30.0* 35.8*  INR 3.4*  --   --  3.0* 3.7*  CREATININE  --  0.39*  --  0.45 0.43*   < > = values in this interval not displayed.    Estimated Creatinine Clearance: 77.8 mL/min (A) (by C-G formula based on SCr of 0.43 mg/dL (L)).    Assessment: Pharmacy consulted to dose warfarin  for this 49 yo female on chronic anti-coagulation therapy with warfarin for history of mechanical valve .  Patient was admitted with high INR of 4.1 on 10/7 and took her last dose of warfarin 2.5mg  on 11/26/19.   Home warfarin dose:  5mg  on Sun/Mon/Wed/Fri and 7.5mg  on Tues/Thurs/Sat Dose recently reduced to 2.5mg  x 3 days and several doses were held last week.  12/02/19 update INR: 3.0>3.7- supratherapeutic Hb: 9.1     Goal of Therapy:  INR Goal: 2.5-3.5 Monitor platelets by anticoagulation protocol: Yes   Plan:  Hold warfarin x 1 dose. Daily INR and every other day CBC Monitor for signs and symptoms of bleeding.  Margot Ables, PharmD Clinical Pharmacist 12/04/2019 12:03 PM

## 2019-12-04 NOTE — Care Management Important Message (Signed)
Important Message  Patient Details  Name: [name] MRN: [id number] Date of Birth: [birth year]   Medicare Important Message Given:  Yes     Tommy Medal 12/04/2019, 3:34 PM

## 2019-12-04 NOTE — Consult Note (Signed)
Cardiology Consultation:   Patient ID: [name] MRN: [id number]; DOB: 01-02-1971  Admit date: 11/30/2019 Date of Consult: 12/04/2019  Primary Care Provider: Sharilyn Sites, MD [facility] Cardiologist: Rozann Lesches, MD     Patient Profile:   [name] is a 49 y.o. female with a hx of mitral valve dz/ s/p replacement  who is being seen today for the evaluation of SVT at the request of Dr Wynetta Emery    History of Present Illness:   [name] is a 49 yo with SLE , RA, Raynauds, hypothyroid, gastroparesis, MV dz (s/p MVR)   She is followed by Myles Gip in clinic   The [name] was admitted for [place] in September (11/09/19 -11/11/19)  Had not been vaccinated. Treatd with prednisone and remdesivir   She was readmitted on  10/5-10/7  For worsening oxygenation and worsening CXR.  Treated with ABX    Represented to ED on 10/9 with SOB and hypoxia on 3L O2  Febrile on admitt  In and out of SVT to rates in 200s   CT showed ground glass and she was started in IV ABX and Decadron.  Oxygenation continued to worsen Today redeveloped SVT with conversion to sinus tachycardia with 12 mg adenosine   Used several times  BP remained relatively stable   IV diltiazem started and bolus given   IV digoxin also ordered   [name] in ST 100s      Past Medical History:  Diagnosis Date  . Abnormal Papanicolaou smear of cervix with positive human papilloma virus (HPV) test 08/17/2015  . Anticoagulation goal of INR 2.5 to 3.5   . Anxiety   . B12 deficiency   . BRCA1 negative   . BRCA2 negative   . Breast cancer, left breast ([place]) 2011   S/P mastectomy; chemo; "no radiation due to lupus"  . Bursitis of right knee    Septic bursitis  . Chronic lower back pain   . Drug-induced hepatitis    States per her rheumatologist, transaminases elevated but normalized after drug removed for   . Fibromyalgia   . GERD (gastroesophageal reflux disease)   . 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 History of abnormal cervical Pap smear 08/12/2015  . History of blood transfusion   . HSIL (high grade squamous intraepithelial lesion) on Pap smear of cervix 01/14/2017   colpo with Dr Elonda Husky   . Hypothyroidism   . Mitral valve disease, rheumatic    St. Jude prosthesis  . Peripheral neuropathy   . Pneumonia   . Raynaud phenomenon   . Rheumatoid arthritis ([place])   . SLE (systemic lupus erythematosus) ([place])     Past Surgical History:  Procedure Laterality Date  . APPENDECTOMY N/A 05/23/2016   Procedure: OPEN APPENDECTOMY WITH DRAINAGE OF ABSCESS;  Surgeon: Fanny Skates, MD;  Location: [facility];  Service: General;  Laterality: N/A;  . APPENDECTOMY    . BREAST BIOPSY Left 2012  . BREAST IMPLANT EXCHANGE Left 12/2015  . CARDIAC CATHETERIZATION  2008  . CARDIAC VALVE REPLACEMENT    . CERVICAL CONIZATION W/BX N/A 03/06/2017   Procedure: Laser Conization of Cervix;  Surgeon: Florian Buff, MD;  Location: [facility];  Service: Gynecology;  Laterality: N/A;  . CORNEAL TRANSPLANT Left   . ENDOMETRIAL ABLATION  2008   She no longer has menses  . ESOPHAGOGASTRODUODENOSCOPY N/A 03/25/2013   Dr. Raliegh Scarlet reflux esophagitis-likely source of patient's symptoms (patulous EG junction). Hiatal hernia otherwise normal  .  LAPAROSCOPIC CHOLECYSTECTOMY    . LIVER BIOPSY  11/28/2016   liver core/notes 11/28/2016  . MASTECTOMY Left 2012  . MASTOPEXY  02/14/2011   Procedure: MASTOPEXY;  Surgeon: Macon Large;  Location: [facility];  Service: Plastics;  Laterality: Bilateral;  Right Breast Reduction   . MITRAL VALVE REPLACEMENT  2008  . TISSUE EXPANDER PLACEMENT  02/14/2011   Procedure: TISSUE EXPANDER;  Surgeon: Macon Large;  Location: [facility];  Service: Plastics;  Laterality: Bilateral;  Left Breast Remove Tissue Expander Placement of Implant Breast Reconstruction  . TISSUE EXPANDER PLACEMENT Left 11/16/2010   breasst  . TUBAL LIGATION         Inpatient Medications: Scheduled Meds: .  acetaminophen  650 mg Oral Q6H  . adenosine      . albuterol  2 puff Inhalation TID  . amoxicillin-clavulanate  1 tablet Oral Q12H  . vitamin C  500 mg Oral Daily  . baricitinib  4 mg Oral Daily  . Chlorhexidine Gluconate Cloth  6 each Topical Daily  . dextromethorphan-guaiFENesin  1 tablet Oral BID  . feeding supplement  237 mL Oral TID BM  . furosemide  20 mg Oral Daily  . levothyroxine  75 mcg Oral QAC breakfast  . methylPREDNISolone (SOLU-MEDROL) injection  40 mg Intravenous Q12H  . metoCLOPramide  10 mg Oral TID AC  . metoprolol tartrate  12.5 mg Oral BID  . multivitamin with minerals  1 tablet Oral Daily  . pantoprazole  40 mg Oral BID AC  . polyethylene glycol  17 g Oral Daily  . [START ON 12/05/2019] potassium chloride  10 mEq Oral Daily  . potassium chloride  40 mEq Oral Once  . saccharomyces boulardii  250 mg Oral BID  . sucralfate  1 g Oral TID WC & HS  . Warfarin - Pharmacist Dosing Inpatient   Does not apply q1600  . zinc sulfate  220 mg Oral Daily  . zolpidem  5 mg Oral QHS   Continuous Infusions:  PRN Meds: ALPRAZolam, chlorpheniramine-HYDROcodone, promethazine, sodium chloride, traMADol  Allergies:    Allergies  Allergen Reactions  . Magnesium-Containing Compounds Other (See Comments)    Chest pain to IV mag  . Methotrexate Derivatives Other (See Comments)    Raised liver enzymes   . Other     04/26/16:  [name] with hx HIT in 2008 at time of heart valve replacement, has received Lovenox several times since then without issue. kph    Social History:   Social History   Socioeconomic History  . Marital status: Legally Separated    Spouse name: Not on file  . Number of children: 2  . Years of education: Not on file  . Highest education level: Not on file  Occupational History  . Not on file  Tobacco Use  . Smoking status: Former Smoker    Packs/day: 1.00    Years: 15.00    Pack years: 15.00    Types: Cigarettes    Quit date: 02/19/2006    Years since  quitting: 13.7  . Smokeless tobacco: Never Used  Vaping Use  . Vaping Use: Never used  Substance and Sexual Activity  . Alcohol use: Not Currently    Alcohol/week: 0.0 standard drinks    Comment: 11/28/2016 "couple drinks/yearZ"  . Drug use: No  . Sexual activity: Yes    Birth control/protection: Surgical    Comment: tubal and ablation  Other Topics Concern  . Not on file  Social History Narrative  .  Not on file   Social Determinants of Health   Financial Resource Strain:   . Difficulty of Paying Living Expenses: Not on file  Food Insecurity:   . Worried About Charity fundraiser in the Last Year: Not on file  . Ran Out of Food in the Last Year: Not on file  Transportation Needs:   . Lack of Transportation (Medical): Not on file  . Lack of Transportation (Non-Medical): Not on file  Physical Activity:   . Days of Exercise per Week: Not on file  . Minutes of Exercise per Session: Not on file  Stress:   . Feeling of Stress : Not on file  Social Connections:   . Frequency of Communication with Friends and Family: Not on file  . Frequency of Social Gatherings with Friends and Family: Not on file  . Attends Religious Services: Not on file  . Active Member of Clubs or Organizations: Not on file  . Attends Archivist Meetings: Not on file  . Marital Status: Not on file  Intimate Partner Violence:   . Fear of Current or Ex-Partner: Not on file  . Emotionally Abused: Not on file  . Physically Abused: Not on file  . Sexually Abused: Not on file    Family History:    Family History  Problem Relation Age of Onset  . Hypertension Mother   . Hyperlipidemia Mother   . Hypertension Father   . Colon cancer Neg Hx   . Colon polyps Neg Hx      ROS:  Please see the history of present illness.   All other ROS reviewed and negative.     Physical Exam/Data:   Vitals:   12/03/19 2150 12/04/19 0200 12/04/19 0809 12/04/19 1106  BP: 111/80 122/75  (!) 124/57  Pulse: 80  85    Resp: 19 (!) 22    Temp: 97.6 F (36.4 C) 98 F (36.7 C)    TempSrc:  Axillary    SpO2: 91% 91% (!) 87%   Weight:      Height:        Intake/Output Summary (Last 24 hours) at 12/04/2019 1227 Last data filed at 12/03/2019 1700 Gross per 24 hour  Intake 1055 ml  Output --  Net 1055 ml   Last 3 Weights 11/30/2019 11/24/2019 11/24/2019  Weight (lbs) 145 lb 8.1 oz 149 lb 0.5 oz 152 lb 14.4 oz  Weight (kg) 66 kg 67.6 kg 69.355 kg     Body mass index is 24.98 kg/m.  General:  [name] appears uncomfortable with SVT/adenosine  On FM   HEENT: normal Lymph: no adenopathy Neck: no JVD Cardiac:  normal S1, S2; RRR;   Tachy    Lungs:  Relatively clear anteriorly   Abd: soft, nontender, no hepatomegaly  Ext: no edema  Feet warm   Musculoskeletal:  No deformities, Otherwise deferred Skin: warm and dry  Neuro:  CNs 2-12 intact grossly intact   Psych:  [name] appears anxious   EKG:  The EKG was personally reviewed and demonstrates:   Telemetry:  Telemetry was personally reviewed and demonstrates:  ST and SVT  Occasional PVC   Rates SVT 200s    Relevant CV Studies:  Echo 08/19/17 - Left ventricle: The cavity size was normal. Wall thickness was  increased in a pattern of mild LVH. Systolic function was normal.  The estimated ejection fraction was in the range of 60% to 65%.  Wall motion was normal; there were no regional wall  motion  abnormalities. The study is not technically sufficient to allow  evaluation of LV diastolic function due to the presence of a  mechanical mitral prosthesis.  - Mitral valve: Normally functioning mechanical mitral prosthesis  (St. Jude). Mean gradient (D): 3 mm Hg.   Laboratory Data:  High Sensitivity Troponin:   Recent Labs  Lab 11/09/19 1405 11/09/19 1841 12/12/2019 2337 11/28/19 0400  TROPONINIHS 10 9 11 15      Chemistry Recent Labs  Lab 12/02/19 0625 12/03/19 0500 12/04/19 0443  NA 135 135 133*  K 4.1 3.7 3.7  CL 100 98 95*   CO2 29 27 30   GLUCOSE 111* 154* 127*  BUN 13 18 17   CREATININE 0.39* 0.45 0.43*  CALCIUM 8.1* 8.1* 7.9*  GFRNONAA >60 >60 >60  ANIONGAP 6 10 8     Recent Labs  Lab 12/02/19 0625 12/03/19 0500 12/04/19 0443  PROT 5.0* 5.4* 4.9*  ALBUMIN 2.2* 2.2* 2.0*  AST 26 32 43*  ALT 47* 44 75*  ALKPHOS 62 81 86  BILITOT 0.5 0.5 0.5   Hematology Recent Labs  Lab 12/02/19 0625 12/03/19 0500 12/04/19 0443  WBC 12.7* 14.4* 15.5*  RBC 2.97* 3.13* 3.07*  HGB 9.2* 9.4* 9.1*  HCT 27.5* 29.0* 28.6*  MCV 92.6 92.7 93.2  MCH 31.0 30.0 29.6  MCHC 33.5 32.4 31.8  RDW 13.8 13.8 14.2  PLT 253 265 267   BNP Recent Labs  Lab 11/22/2019 2337  BNP 84.0    DDimer  Recent Labs  Lab 12/04/19 0443  DDIMER 4.77*     Radiology/Studies:  DG CHEST PORT 1 VIEW  Result Date: 12/03/2019 CLINICAL DATA:  Shortness of breath.  Reported COVID-19 positive. EXAM: PORTABLE CHEST 1 VIEW COMPARISON:  November 28, 2019 FINDINGS: There is extensive airspace opacity throughout the mid and lower lung regions as well as in the right upper lobe. There is consolidation throughout portions of the left mid and lower lung regions. Heart is upper normal in size with pulmonary vascularity normal. Central catheter tip is in the superior vena cava. Apparent pacemaker wires attached to right heart. No pneumothorax. No bone lesions. No appreciable adenopathy. IMPRESSION: Multifocal pneumonia with an increase in opacity in the right upper lobe compared to most recent study. Relative consolidation noted in the left lower lung region. Stable cardiac silhouette. No adenopathy appreciable. Appearance overall indicative of atypical organism pneumonia. The consolidation in the left lower lobe potentially could represent a degree of bacterial superinfection. Electronically Signed   By: Lowella Grip III M.D.   On: 12/03/2019 09:14     Assessment and Plan:   1.  SVT  [name] with recurrent episodes of SVT   Rates 220   Maintained BP  through this    Now on IV diltiazem and digoxin   Most likely driven by increased adrenergic tone in current setting pulmonary setting  Keep on IV diltiazem for now   Can switch digoxin to po for time being  Probably not use in longer term   2 Hx rhematic MV dz, s/p St Jude MVR    Follows in coumadin clinic   Last echo in 2019   MV prosthesis with mean gradient of 3.   LVEF normal    RVEF normal  Atria noted to be normal size Would repeat echo to reassess pressures/ valve function and Ventricular function   3  ID  [name] with recent COVID   Now with signif pulmonary reaction   CCM will follow  Probable tx to [facility]  Being treated for pneumonia    4  Rheum  [name] with RA, lupus and Raynauds   IM to follow   For questions or updates, please contact [facility] Please consult [email or web address] for contact info under    Signed, Dorris Carnes, MD  12/04/2019 12:27 PM

## 2019-12-04 NOTE — Consult Note (Addendum)
NAME:  [name], MRN:  [id number], DOB:  06-09-1970, LOS: 6 ADMISSION DATE:  12/15/2019, CONSULTATION DATE:  12/04/2019 REFERRING MD:  Dr. Harrington Challenger, Cardiology, CHIEF COMPLAINT:  Short of breath   Brief History   49 yo female with hx of SLE and RA on rituximab as an outpt (last dose beginning of September 2021) was diagnosed with COVID 19 pneumonia on 11/02/19 and admitted to [facility] from 11/09/19 to 11/11/19.  She was treated with remdesivir and decadron.  She returned to ER on 11/24/19 with progressive dyspnea, fever up to 102F, hypoxia.  Found to have worsening pulmonary infiltrates.  Started on ABx for CAP and dexamethasone.  She was d/c home again on 11/26/19 with prednisone taper and supplemental oxygen.  She again returned to the ER on 11/25/2019 with hypoxia and dizziness.  She was started on solumedrol and antibiotics again.  She had increased oxygen requirements.  She developed SVT on 12/04/19 and transferred to ICU.  PCCM consulted.  Past Medical History  RA, SLE, MVR on chronic anticoagulation, Raynaud's, Gastroparesis, Hypothyroidism  Significant Hospital Events   10/08 Readmit to [facility] 10/12 increasing O2 needs, started high flow oxygen 10/15 SVT, transfer to ICU; arrange for transfer to [place]:  Cardiology at [facility]  Procedures:  Rt IJ PICC 10/09 >>   Significant Diagnostic Tests:  CT angio chest 11/28/19 >> b/l diffuse GGO   Micro Data:  COVID 9/20 >> positive Blood 10/09 >> negative Legionella 10/10 >> negative MRSA screen 10/15 >>  Fungitell 10/15 >>   Antimicrobials:  Rocephin 10/08 >> 10/11 Zithromax 10/08 >> 10/12 Baricitinib 10/13 >> 10/15  Augmentin 10/14 >> 10/15 Vancomycin 10/15 >> Cefepime 10/15 >> Eraxis 10/15 >>   Interim history/subjective:    Objective   Blood pressure 120/80, pulse (!) 105, temperature 98 F (36.7 C), temperature source Axillary, resp. rate (!) 32, height 5' 4"  (1.626 m), weight 66 kg, SpO2 (!) 86 %.    FiO2 (%):  [15  %] 15 %   Intake/Output Summary (Last 24 hours) at 12/04/2019 1344 Last data filed at 12/03/2019 1700 Gross per 24 hour  Intake 240 ml  Output --  Net 240 ml   Filed Weights   11/30/19 0008  Weight: 66 kg    Examination:  General - alert, speaking in full sentences Eyes - pupils reactive ENT - no sinus tenderness, no stridor Cardiac - regular rate, tachycardic, no murmur Chest - b/l crackles Abdomen - soft, non tender, + bowel sounds Extremities - no cyanosis, clubbing, or edema Skin - no rashes Neuro - normal strength, moves extremities, follows commands Psych - normal mood and behavior   Resolved Hospital Problem list     Assessment & Plan:   Acute hypoxic respiratory failure from COVID 19 pneumonia with probable superinfection and/or acute pneumonitis with history of SLE/RA. - she has hx of atypical infections in the past and is chronically immunosuppressed; in addition she has recently been on several doses of steroids and antibiotics - she could have bacterial and/or fungal pathogens - will change antimicrobials to vancomycin, cefepime, and eraxis - increase solumedrol to 60 mg q6h - don't think COVID 19 is active infectious process at this time >> d/c baricitinib - f/u CXR - goal SpO2 85 to 95% - monitor in ICU; might need ETT but work of breathing seems acceptable at this time - continue antitussive regimen >> reports that coughing spells triggered SVT  SVT. Hx of HTN, MVR. - continue cardizem gtt -  cardiology consulted - f/u Echo - monitor on telemetry - continue coumadin and f/u INR - continue lasix, lopressor  Steroid induced hyperglycemia. - SSI  Hx of RA, SLE. - hold outpt rituximab  Anemia of critical illness and chronic disease. - f/u CBC - transfuse for Hb < 7 or significant bleeding  Hx of GERD and gastroparesis. - clear liquid diet - continue reglan, miralax, florastor, protonix, carafate  Hx of hypothyroidism. - continue  synthroid  Moderate protein calorie malnutrition. - f/u with nutriton  Best practice:  Diet: clear liquids DVT prophylaxis: coumadin GI prophylaxis: protonix Mobility: bed rest Code Status: full code Family Communication: updated family at bedside Disposition: ICU.  Transfer to [facility]   CBC: Recent Labs  Lab 11/30/19 [phone] 12/01/19 0546 12/02/19 0625 12/03/19 0500 12/04/19 0443  WBC 13.1* 9.1 12.7* 14.4* 15.5*  NEUTROABS 12.2* 8.4* 12.0* 13.5* 14.5*  HGB 9.6* 8.8* 9.2* 9.4* 9.1*  HCT 28.9* 26.9* 27.5* 29.0* 28.6*  MCV 91.7 92.1 92.6 92.7 93.2  PLT 339 262 253 265 532    Basic Metabolic Panel: Recent Labs  Lab 11/29/19 0428 11/29/19 0428 11/30/19 0733 11/30/19 0733 12/01/19 0546 12/02/19 0625 12/03/19 0500 12/04/19 0443 12/04/19 1245  NA 139   < > 134*   < > 135 135 135 133* 130*  K 3.1*   < > 3.6   < > 3.5 4.1 3.7 3.7 3.8  CL 103   < > 98   < > 97* 100 98 95* 96*  CO2 24   < > 26   < > 28 29 27 30 26   GLUCOSE 204*   < > 114*   < > 162* 111* 154* 127* 120*  BUN 16   < > 16   < > 12 13 18 17 17   CREATININE 0.44   < > 0.44   < > 0.42* 0.39* 0.45 0.43* 0.42*  CALCIUM 7.0*   < > 7.9*   < > 7.4* 8.1* 8.1* 7.9* 7.8*  MG 2.0   < > 2.3   < > 2.0 2.1 2.3 2.0 1.8  PHOS 2.3*  --  3.4  --  3.3 3.0 3.0  --   --    < > = values in this interval not displayed.   GFR: Estimated Creatinine Clearance: 77.8 mL/min (A) (by C-G formula based on SCr of 0.42 mg/dL (L)). Recent Labs  Lab 11/20/2019 2337 11/28/19 0400 11/29/19 0428 11/30/19 0733 12/01/19 0546 12/02/19 0625 12/03/19 0500 12/04/19 0443  PROCALCITON  --  0.34 0.13  --   --   --   --   --   WBC   < >  --  8.2   < > 9.1 12.7* 14.4* 15.5*   < > = values in this interval not displayed.    Liver Function Tests: Recent Labs  Lab 11/30/19 0733 12/01/19 0546 12/02/19 0625 12/03/19 0500 12/04/19 0443  AST 39 29 26 32 43*  ALT 63* 56* 47* 44 75*  ALKPHOS 60 59 62 81 86  BILITOT 0.5 0.3 0.5  0.5 0.5  PROT 5.5* 5.0* 5.0* 5.4* 4.9*  ALBUMIN 2.4* 2.2* 2.2* 2.2* 2.0*   No results for input(s): LIPASE, AMYLASE in the last 168 hours. No results for input(s): AMMONIA in the last 168 hours.  ABG    Component Value Date/Time   HCO3 29.2 (H) 12/12/2019 2337   O2SAT 16.4 12/12/2019 2337     Coagulation Profile: Recent Labs  Lab  11/30/19 0733 12/01/19 0546 12/02/19 0544 12/03/19 0500 12/04/19 0443  INR 3.2* 4.6* 3.4* 3.0* 3.7*    Cardiac Enzymes: Recent Labs  Lab 11/30/19 0733  CKTOTAL 56    HbA1C: No results found for: HGBA1C  CBG: Recent Labs  Lab 11/30/19 2255 12/01/19 0728  GLUCAP 122* 127*    Review of Systems:   Reviewed and negative  Past Medical History  She,  has a past medical history of Abnormal Papanicolaou smear of cervix with positive human papilloma virus (HPV) test (08/17/2015), Anticoagulation goal of INR 2.5 to 3.5, Anxiety, B12 deficiency, BRCA1 negative, BRCA2 negative, Breast cancer, left breast ([place]) (2011), Bursitis of right knee, Chronic lower back pain, Drug-induced hepatitis, Fibromyalgia, GERD (gastroesophageal reflux disease), 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   Surgical History    Past Surgical History:  Procedure Laterality Date  . APPENDECTOMY N/A 05/23/2016   Procedure: OPEN APPENDECTOMY WITH DRAINAGE OF ABSCESS;  Surgeon: Fanny Skates, MD;  Location: [place];  Service: General;  Laterality: N/A;  . APPENDECTOMY    . BREAST BIOPSY Left 2012  . BREAST IMPLANT EXCHANGE Left 12/2015  . CARDIAC CATHETERIZATION  2008  . CARDIAC VALVE REPLACEMENT    . CERVICAL  CONIZATION W/BX N/A 03/06/2017   Procedure: Laser Conization of Cervix;  Surgeon: Florian Buff, MD;  Location: [facility];  Service: Gynecology;  Laterality: N/A;  . CORNEAL TRANSPLANT Left   . ENDOMETRIAL ABLATION  2008   She no longer has menses  . ESOPHAGOGASTRODUODENOSCOPY N/A 03/25/2013   Dr. Raliegh Scarlet reflux esophagitis-likely source of patient's symptoms (patulous EG junction). Hiatal hernia otherwise normal  . LAPAROSCOPIC CHOLECYSTECTOMY    . LIVER BIOPSY  11/28/2016   liver core/notes 11/28/2016  . MASTECTOMY Left 2012  . MASTOPEXY  02/14/2011   Procedure: MASTOPEXY;  Surgeon: Macon Large;  Location: [facility];  Service: Plastics;  Laterality: Bilateral;  Right Breast Reduction   . MITRAL VALVE REPLACEMENT  2008  . TISSUE EXPANDER PLACEMENT  02/14/2011   Procedure: TISSUE EXPANDER;  Surgeon: Macon Large;  Location: [place];  Service: Plastics;  Laterality: Bilateral;  Left Breast Remove Tissue Expander Placement of Implant Breast Reconstruction  . TISSUE EXPANDER PLACEMENT Left 11/16/2010   breasst  . TUBAL LIGATION       Social History   reports that she quit smoking about 13 years ago. Her smoking use included cigarettes. She has a 15.00 pack-year smoking history. She has never used smokeless tobacco. She reports previous alcohol use. She reports that she does not use drugs.   Family History   Her family history includes Hyperlipidemia in her mother; Hypertension in her father and mother. There is no history of Colon cancer or Colon polyps.   Allergies Allergies  Allergen Reactions  . Magnesium-Containing Compounds Other (See Comments)    Chest pain to IV mag  . Methotrexate Derivatives Other (See Comments)    Raised liver enzymes   . 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 kph     Home Medications  Prior to Admission medications   Medication Sig Start Date End Date  Taking?  Authorizing Provider  acetaminophen (TYLENOL) 325 MG tablet Take 2 tablets (650 mg total) by mouth every 6 (six) hours as needed for mild pain or headache (fever >/= 101). 11/11/19  Yes Barton Dubois, MD  albuterol (VENTOLIN HFA) 108 (90 Base) MCG/ACT inhaler Inhale 2 puffs into the lungs 3 (three) times daily. 11/26/19  Yes Emokpae, Courage, MD  ascorbic acid (VITAMIN C) 500 MG tablet Take 1 tablet (500 mg total) by mouth daily. 12/20/2019  Yes Roxan Hockey, MD  bromfenac (XIBROM) 0.09 % ophthalmic solution Place 1 drop into the left eye.  06/23/19  Yes [provider]  chlorpheniramine-HYDROcodone (TUSSIONEX PENNKINETIC ER) 10-8 MG/5ML SUER Take 5 mLs by mouth every 12 (twelve) hours as needed for cough. 11/26/19  Yes Emokpae, Courage, MD  cyanocobalamin (,VITAMIN B-12,) 1000 MCG/ML injection Inject 1 mL (1,000 mcg total) into the muscle every 30 (thirty) days. 12/19/10  Yes Everardo All, MD  cycloSPORINE (RESTASIS) 0.05 % ophthalmic emulsion Place 1 drop into the left eye daily.   Yes [provider]  DEXILANT 60 MG capsule TAKE ONE CAPSULE BY MOUTH DAILY. Patient taking differently: Take 60 mg by mouth daily.  09/25/19  Yes Mahala Menghini, PA-C  furosemide (LASIX) 40 MG tablet Take 0.5 tablets (20 mg total) by mouth daily. 11/11/19  Yes Barton Dubois, MD  levothyroxine (SYNTHROID) 75 MCG tablet Take 1 tablet (75 mcg total) by mouth daily before breakfast. 07/15/19  Yes Nida, Marella Chimes, MD  metoCLOPramide (REGLAN) 5 MG tablet TAKE (1) TABLET BY MOUTH TWICE DAILY. Patient taking differently: Take 5 mg by mouth in the morning and at bedtime.  02/03/19  Yes Annitta Needs, NP  nepafenac (NEVANAC) 0.1 % ophthalmic suspension Place 1 drop into the left eye daily.  06/19/19  Yes [provider]  ondansetron (ZOFRAN) 4 MG tablet Take 1 tablet (4 mg total) by mouth every 8 (eight) hours as needed for nausea or vomiting. 10/09/17  Yes Annitta Needs, NP  polyethylene  glycol powder (GLYCOLAX/MIRALAX) 17 GM/SCOOP powder Take 17 g by mouth daily as needed for mild constipation. 11/26/19  Yes [provider]  potassium chloride (KLOR-CON 10) 10 MEQ tablet Take 10 mEq by mouth every evening. Take one tablet daily as directed 11/09/13  Yes [provider]  prednisoLONE acetate (PRED FORTE) 1 % ophthalmic suspension Place 1 drop into the left eye daily.  09/28/19  Yes [provider]  predniSONE (DELTASONE) 10 MG tablet Take 1 tablet (10 mg total) by mouth See admin instructions. Take 40 mg (4tab) daily for 4 days, then 30 mg daily for 4 days, then 20 mg (2 tab) daily for 4 days , then 10 mg (1 Tab) daily for 4 days, then STOP 11/26/19  Yes Emokpae, Courage, MD  promethazine (PHENERGAN) 12.5 MG tablet Take 1 tablet (12.5 mg total) by mouth every 6 (six) hours as needed for nausea or vomiting. 10/09/17  Yes Annitta Needs, NP  riTUXimab (RITUXAN) 100 MG/10ML injection Inject 10 mLs (100 mg total) into the vein See admin instructions. Every 4 months; please hold next injection until discussion with rheumatology service 11/11/19  Yes Barton Dubois, MD  rOPINIRole (REQUIP) 1 MG tablet Take 1 mg by mouth at bedtime.   Yes [provider]  warfarin (COUMADIN) 5 MG tablet Take 1 tablet (5 mg total) by mouth daily. 11/26/19  Yes Emokpae, Courage, MD  zinc sulfate 220 (50 Zn) MG capsule Take 1 capsule (220 mg total) by mouth daily.  12/08/2019  Yes Emokpae, Courage, MD  zolpidem (AMBIEN) 10 MG tablet Take 10 mg by mouth at bedtime.  12/19/10  Yes de Stanford Scotland, MD     Critical care time: 52 minutes  Chesley Mires, MD [place] Pager - [phone] 12/04/2019, 2:25 PM

## 2019-12-04 NOTE — Significant Event (Signed)
12/04/2019 11:52 AM  Called to rapid response as patient developed acute SVT, she was symptomatic with shortness of breath symptoms.  We gave adenosine 6mg  and bolus of IV fluids and the SVT resolved and she went into sinus tachycardia HR around 110.  She is fully anticoagulated on warfarin.  Her electrolytes were reviewed and have been corrected. Potassium low normal, ordered additional oral dose now.  I suspect her severe hypoxia from Covid is triggering her SVT. I have added metoprolol 12.5 mg BID with holding parameters.  Follow lytes closely.  I asked for cardiology consult as this is her second bout of this SVT and she has mechanical valve replacement.  2D echo ordered.  Transfer to stepdown ICU.   Critical care time spent: 40 minutes.   Murvin Natal MD  How to contact the [facility] Attending or Consulting provider [place] or covering provider during after hours [place], for this patient?  1. Check the care team in [facility] and look for a) attending/consulting [facility] provider listed and b) the [facility] team listed 2. Log into [email or web address] and use [place] universal password to access. If you do not have the password, please contact the hospital operator. 3. Locate the [facility] provider you are looking for under [facility] and page to a number that you can be directly reached. 4. If you still have difficulty reaching the provider, please page the [facility] (Director on Call) for the Hospitalists listed on amion for assistance.

## 2019-12-04 NOTE — Progress Notes (Signed)
Pharmacy Antibiotic Note  [name] is a 48 y.o. female admitted on 11/28/2019 with pneumonia.  Pharmacy has been consulted for Vancomycin and Cefepime dosing.  Plan: Vancomycin 1500 mg IV x 1 dose. Vancomycin 1250 mg IV every 24 hours. Expected AUC 507 Cefepime 2000 mg IV every 8 hours. Anidulafungin IV 100 mg daily. Monitor labs, c/s, and vanco level as indicated.    Height: 5\' 4"  (162.6 cm) Weight: 66 kg (145 lb 8.1 oz) IBW/kg (Calculated) : 54.7  Temp (24hrs), Avg:97.8 F (36.6 C), Min:97.6 F (36.4 C), Max:98 F (36.7 C)  Recent Labs  Lab 11/30/19 0733 11/30/19 0733 12/01/19 0546 12/02/19 0625 12/03/19 0500 12/04/19 0443 12/04/19 1245  WBC 13.1*  --  9.1 12.7* 14.4* 15.5*  --   CREATININE 0.44   < > 0.42* 0.39* 0.45 0.43* 0.42*   < > = values in this interval not displayed.    Estimated Creatinine Clearance: 77.8 mL/min (A) (by C-G formula based on SCr of 0.42 mg/dL (L)).    Allergies  Allergen Reactions  . Magnesium-Containing Compounds Other (See Comments)    Chest pain to IV mag  . Methotrexate Derivatives Other (See Comments)    Raised liver enzymes   . 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 kph    Antimicrobials this admission: Vanco 10/15 >>  Cefepime 10/15 >>  Eraxis 10/15 >. Augmentin 10/14 >>10/15 CTX 10/9 >>> 10/12 Azith 10/9 >> 10/12 baricitinib 10/13 >> 10/15   Microbiology results: 10/9 BCx: ng  10/15 MRSA PCR: pending  Thank you for allowing pharmacy to be a part of this patient's care.  Ramond Craver 12/04/2019 2:18 PM

## 2019-12-04 NOTE — Progress Notes (Signed)
Patient back in SVT 1207. Dr Wynetta Emery at bedside.   Adenosine 12 mg given at 1209 with noted conversion to sinus tach EKG obtained post conversion, sinus tach Cardizem infusion started 1215 at 5 mg/hr per Dr Wynetta Emery verbal order Cardiology paged to bedside 1210 Patient back in SVT at 1232 Adenosine 12 mg given per Dr Wynetta Emery verbal [phone] with successful conversion to sinus tach. Cardizem Drip increased to 10 mg/h per Dr Wynetta Emery verbal order.  Cardiology MD at bedside 1230 Digoxin 0.125 mg given IV per Dr Harrington Challenger verbal order at 1239 Patient rhythm back in SVT 1247 Adenosine 12 mg given 1248 per Dr Harrington Challenger verbal order 10 mg bolus of Cardizem given 1250 per verbal order by Dr Harrington Challenger Repeat dose of 0.125 mg Digoxin given per Dr Harrington Challenger verbal order at 1258 Dr Harrington Challenger wanting additional 0.25 mg digoxin. Dose given 1310.  Patient blood pressure has remained stable throughout rapid response. Noted PVCs on monitor. Patient denies any pain or increased shortness of breath. Patient's oxygen saturations have been remaining 87-91 on HFNC and NRB.   Dr Halford Chessman at bedside 1336 for patient evaluation

## 2019-12-04 NOTE — Plan of Care (Signed)

## 2019-12-05 ENCOUNTER — Inpatient Hospital Stay (HOSPITAL_COMMUNITY): Payer: BC Managed Care – PPO

## 2019-12-05 DIAGNOSIS — I361 Nonrheumatic tricuspid (valve) insufficiency: Secondary | ICD-10-CM | POA: Diagnosis not present

## 2019-12-05 DIAGNOSIS — R9431 Abnormal electrocardiogram [ECG] [EKG]: Secondary | ICD-10-CM | POA: Diagnosis not present

## 2019-12-05 DIAGNOSIS — E8809 Other disorders of plasma-protein metabolism, not elsewhere classified: Secondary | ICD-10-CM | POA: Diagnosis not present

## 2019-12-05 DIAGNOSIS — E43 Unspecified severe protein-calorie malnutrition: Secondary | ICD-10-CM

## 2019-12-05 DIAGNOSIS — U071 COVID-19: Secondary | ICD-10-CM | POA: Diagnosis not present

## 2019-12-05 DIAGNOSIS — Z5181 Encounter for therapeutic drug level monitoring: Secondary | ICD-10-CM | POA: Diagnosis not present

## 2019-12-05 DIAGNOSIS — M328 Other forms of systemic lupus erythematosus: Secondary | ICD-10-CM

## 2019-12-05 DIAGNOSIS — J9601 Acute respiratory failure with hypoxia: Secondary | ICD-10-CM | POA: Diagnosis not present

## 2019-12-05 LAB — COMPREHENSIVE METABOLIC PANEL
ALT: 58 U/L — ABNORMAL HIGH (ref 0–44)
AST: 26 U/L (ref 15–41)
Albumin: 2.1 g/dL — ABNORMAL LOW (ref 3.5–5.0)
Alkaline Phosphatase: 85 U/L (ref 38–126)
Anion gap: 7 (ref 5–15)
BUN: 19 mg/dL (ref 6–20)
CO2: 30 mmol/L (ref 22–32)
Calcium: 8.2 mg/dL — ABNORMAL LOW (ref 8.9–10.3)
Chloride: 96 mmol/L — ABNORMAL LOW (ref 98–111)
Creatinine, Ser: 0.49 mg/dL (ref 0.44–1.00)
GFR, Estimated: >60 mL/min (ref >60)
Glucose, Bld: 117 mg/dL — ABNORMAL HIGH (ref 70–99)
Potassium: 3.9 mmol/L (ref 3.5–5.1)
Sodium: 133 mmol/L — ABNORMAL LOW (ref 135–145)
Total Bilirubin: 0.5 mg/dL (ref 0.3–1.2)
Total Protein: 5.2 g/dL — ABNORMAL LOW (ref 6.5–8.1)

## 2019-12-05 LAB — CBC WITH DIFFERENTIAL/PLATELET
Abs Immature Granulocytes: 0.36 10*3/uL — ABNORMAL HIGH (ref 0.00–0.07)
Basophils Absolute: 0 10*3/uL (ref 0.0–0.1)
Basophils Relative: 0 %
Eosinophils Absolute: 0 10*3/uL (ref 0.0–0.5)
Eosinophils Relative: 0 %
HCT: 27.9 % — ABNORMAL LOW (ref 36.0–46.0)
Hemoglobin: 9.1 g/dL — ABNORMAL LOW (ref 12.0–15.0)
Immature Granulocytes: 3 %
Lymphocytes Relative: 1 %
Lymphs Abs: 0.2 10*3/uL — ABNORMAL LOW (ref 0.7–4.0)
MCH: 30.4 pg (ref 26.0–34.0)
MCHC: 32.6 g/dL (ref 30.0–36.0)
MCV: 93.3 fL (ref 80.0–100.0)
Monocytes Absolute: 0.2 10*3/uL (ref 0.1–1.0)
Monocytes Relative: 1 %
Neutro Abs: 12.4 10*3/uL — ABNORMAL HIGH (ref 1.7–7.7)
Neutrophils Relative %: 95 %
Platelets: 172 10*3/uL (ref 150–400)
RBC: 2.99 MIL/uL — ABNORMAL LOW (ref 3.87–5.11)
RDW: 14.6 % (ref 11.5–15.5)
WBC: 13.1 10*3/uL — ABNORMAL HIGH (ref 4.0–10.5)
nRBC: 0 % (ref 0.0–0.2)

## 2019-12-05 LAB — MAGNESIUM: Magnesium: 2.2 mg/dL (ref 1.7–2.4)

## 2019-12-05 LAB — GLUCOSE, CAPILLARY
Glucose-Capillary: 118 mg/dL — ABNORMAL HIGH (ref 70–99)
Glucose-Capillary: 117 mg/dL — ABNORMAL HIGH (ref 70–99)
Glucose-Capillary: 132 mg/dL — ABNORMAL HIGH (ref 70–99)
Glucose-Capillary: 171 mg/dL — ABNORMAL HIGH (ref 70–99)
Glucose-Capillary: 131 mg/dL — ABNORMAL HIGH (ref 70–99)

## 2019-12-05 LAB — ECHOCARDIOGRAM COMPLETE
Area-P 1/2: 3.68 cm2
Height: 64 in
S' Lateral: 2.33 cm
Weight: 2328.06 oz

## 2019-12-05 LAB — PROTIME-INR
INR: 2.4 — ABNORMAL HIGH (ref 0.8–1.2)
Prothrombin Time: 25.6 seconds — ABNORMAL HIGH (ref 11.4–15.2)

## 2019-12-05 LAB — PHOSPHORUS: Phosphorus: 3.7 mg/dL (ref 2.5–4.6)

## 2019-12-05 MED ORDER — ONDANSETRON HCL 4 MG/2ML IJ SOLN
4.0000 mg | Freq: Once | INTRAMUSCULAR | Status: AC
  Administered 2019-12-05: 4 mg via INTRAVENOUS
  Filled 2019-12-05: qty 2

## 2019-12-05 MED ORDER — INSULIN ASPART 100 UNIT/ML ~~LOC~~ SOLN
0.0000 [IU] | Freq: Three times a day (TID) | SUBCUTANEOUS | Status: DC
  Administered 2019-12-06: 3 [IU] via SUBCUTANEOUS
  Administered 2019-12-06: 4 [IU] via SUBCUTANEOUS

## 2019-12-05 MED ORDER — WARFARIN SODIUM 2.5 MG PO TABS
2.5000 mg | ORAL_TABLET | Freq: Once | ORAL | Status: AC
  Administered 2019-12-05: 2.5 mg via ORAL
  Filled 2019-12-05: qty 1

## 2019-12-05 MED ORDER — SODIUM CHLORIDE 0.9 % IV BOLUS
500.0000 mL | Freq: Once | INTRAVENOUS | Status: AC
  Administered 2019-12-05: 500 mL via INTRAVENOUS

## 2019-12-05 NOTE — Progress Notes (Signed)
ANTICOAGULATION CONSULT NOTE -   Pharmacy Consult for:  warfarin dosing Indication:  Mechanical valve  Vital Signs:  Temp: 97.4 F (36.3 C) (10/16 0400) Temp Source: Axillary (10/16 0400) BP: 94/75 (10/16 0800) Pulse Rate: 81 (10/16 0800)  Labs: Recent Labs    12/03/19 0500 12/03/19 0500 12/04/19 0443 12/04/19 1245 12/05/19 0414 12/05/19 0545  HGB 9.4*   < > 9.1*  --  9.1*  --   HCT 29.0*  --  28.6*  --  27.9*  --   PLT 265  --  267  --  172  --   LABPROT 30.0*  --  35.8*  --   --  25.6*  INR 3.0*  --  3.7*  --   --  2.4*  CREATININE 0.45   < > 0.43* 0.42* 0.49  --    < > = values in this interval not displayed.    Estimated Creatinine Clearance: 77.8 mL/min (by C-G formula based on SCr of 0.49 mg/dL).    Assessment: Pharmacy consulted to dose warfarin  for this 49 yo female on chronic anti-coagulation therapy with warfarin for history of mechanical valve .  Patient was admitted with high INR of 4.1 on 10/7 and took her last dose of warfarin 2.5mg  on 11/26/19.   Home warfarin dose:  5mg  on Sun/Mon/Wed/Fri and 7.5mg  on Tues/Thurs/Sat Dose recently reduced to 2.5mg  x 3 days and several doses were held last week.  12/02/19 update INR: 3.0>3.7> 2.4 (held dose 10/15) Hb: 9.1     Goal of Therapy:  INR Goal: 2.5-3.5 Monitor platelets by anticoagulation protocol: Yes   Plan:  Warfarin 2.5 mg x 1 dose. Daily INR and every other day CBC Monitor for signs and symptoms of bleeding.  Margot Ables, PharmD Clinical Pharmacist 12/05/2019 9:47 AM

## 2019-12-05 NOTE — Progress Notes (Signed)
*  PRELIMINARY RESULTS* Echocardiogram 2D Echocardiogram has been performed.  Leavy Cella 12/05/2019, 12:02 PM

## 2019-12-05 NOTE — Progress Notes (Signed)
PROGRESS NOTE   [name]  WSF:[id number] DOB: 1971/02/05 DOA: 12/09/2019 PCP: Sharilyn Sites, MD   Chief Complaint  Patient presents with   Shortness of Breath    Brief Admission History:  49 y.o.femalewith medical history significant forSLE, rheumatoid arthritis, Raynaud's, hypothyroidism, and gastroparesis with persistent nausea as well as mitral valve replacement on chronic anticoagulation and recent COVID-19 infection/pneumonia --- recently discharged home with home O2 on 11/26/19 ---Readmitted on 11/28/2019 with worsening hypoxia and dyspnea and significant tachycardia and tachypnea--- -patient is immunocompromised due to immunosuppression for SLE and rheumatoid arthritis -She was not vaccinated  Assessment & Plan:   Acute respiratory failuredue to progression of Covid pneumonia versus HCAP.   -readmitted on 11/28/2019 with worsening hypoxia dyspnea and significant tachycardia and tachypnea --despite completing treatment with steroids and remdesivir.  -CTA chest on 11/28/2019 with bilateral groundglassopacities consistent with Covid pneumonia along with elevated pulmonary artery pressures but no evidence of PE  Discussed with pulmonologist and concern is that patient has superimposed bacterial / fungal pneumonia.  Pt was started on broad spectrum coverage by pulmonary critical care team 12/04/19.   -Dyspnea and hypoxia worsening ---patient with increased oxygen requirements--requiring up to 25 L of oxygen via nasal cannula PLUS NRB -Procalcitonin is up to 0.34 >> 0.13 -COVID-19 admission protocol -Mucolytics, inhalers as needed -Patient has already completed course of remdesivir on recent hospitalization -Continue bronchodilators and mucolytics -Continue IV Solu-Medrol -as patient clinically is declining with worsening respiratory status, added baricitinib 10/13  Rheumatoid arthritis, SLE, immunosuppression-stable. -Hold rituximab -Currently on steroids for Covid  pneumonia -As she has been off her immunosuppressants for a while,patient should discuss with herrheumatologist on discharge if she can get the Covid vaccine in the near future  Mitral valve replacement with mechanical valve, supratherapeutic INR Her anticoagulation goal INR is 2.5-3.5.  Hemoglobin is stable no sign of bleeding. -INR -was recently supratherapeutic, currently 4.6 -Patient had an episode of nosebleed on 11/29/2019 -Pharmacy to manage warfarin and following daily PT/INR.    Acute on chronic anemia--hemoglobin is down to 8.8 from a baseline around 11- -patient is an episode of nosebleed on 11/29/2019 -Monitor closely  -Possible UTI--- UA from 11/29/2019 suspicious for UTI, urine culture was not obtained,  patient was empirically treated with  IV Rocephin for total of 3 days -Patient had fever on presentation on 11/28/2019, no further fevers  Gastroparesis/GERD Complains of increasing nausea and heartburn okay to increase Reglan, may use as needed Phenergan --Suspect GI symptoms are worsened due to steroid therapy -Continue Protonix  Hypothyroidism Continuehome Synthroid  -Hypokalemia and hypophosphatemia---Repleted: Follow.   SVT/tachyarrhythmia--- tachycardia on admission - on 10/15 Pt developed recurrent SVT HR 220s, she was given several doses of adenosine but SVT rapidly returned, started on low dose cardizem with return of SVT, cardiology consulted Dr Harrington Challenger at bedside, given bolus dose of cardizem and IV digoxin and Dr. Harrington Challenger spoke with Dr. Halford Chessman and recommended patient transfer to ICU at [facility].  Dr. Harrington Challenger says there are 4 ICU beds available now at [facility].  Family asked for transfer to [facility]. Spoke with [facility] transfer line and they informed us that patient would have to be wait-listed. Family then decided that they wanted her to go to [facility].  Pt remains on IV cardizem but no recurrence of SVT.      Elevated transaminitis AST 58>> 35>>39>.29 ALT  71>>47>>63>>58  Hypoalbuminemia possibly secondary to moderate protein calorie malnutrition Albumin 2.4;this is possibly due to decreased appetite secondary to Covid 19 pneumonia  Dietary Consult appreciated  -Continue nutritional supplements  Code Status:Full code Family Communication:daughters at bedside  Consults called:cardiology and pulmonology (intensivist)   Principal Problem:   Acute respiratory failure with hypoxia ([place]) Active Problems:   History of mitral valve replacement with mechanical valve   Systemic lupus erythematosus (HCC)   Anticoagulation goal of INR 2.5 to 3.5   Gastroparesis   Primary hypothyroidism   Pneumonia due to COVID-19 virus   Hypokalemia   Hypophosphatemia   Transaminitis   Hypoalbuminemia   Acute respiratory disease due to COVID-19 virus   Protein-calorie malnutrition, severe   COVID-19 virus infection   SVT (supraventricular tachycardia) ([place])  Status is: Inpatient  Remains inpatient appropriate because:IV treatments appropriate due to intensity of illness or inability to take PO and Inpatient level of care appropriate due to severity of illness   Dispo: The patient is from: Home              Anticipated d/c is to: Home              Anticipated d/c date is: > 3 days              Patient currently is not medically stable to d/c. Consultants:   Dr. Harrington Challenger cardiology  Dr. Halford Chessman pulmonology intensivist  Procedures:     Antimicrobials:     Subjective: Pt reports that she has no chest pain and no palpitations.  Her SOB and cough persist.  She says she is afraid that if she coughs she will go into SVT again.        Objective: Vitals:   12/05/19 0500 12/05/19 0600 12/05/19 0700 12/05/19 0800  BP: 109/72  109/65 94/75  Pulse: 68 75 67 81  Resp: (!) 23 (!) 21 (!) 25 (!) 22  Temp:      TempSrc:      SpO2: 92% (!) 86% 90% (!) 78%  Weight:      Height:        Intake/Output Summary (Last 24 hours) at 12/05/2019 1002 Last data  filed at 12/05/2019 0400 Gross per 24 hour  Intake 658.32 ml  Output 2350 ml  Net -1691.68 ml   Filed Weights   11/30/19 0008  Weight: 66 kg    Examination:  General exam: Pt appears weak and frail. Pt more alert and talkative today.   Respiratory system: bilateral rales heard.   Cardiovascular system: normal S1 & S2 heard. No JVD, murmurs, rubs, gallops or clicks. No pedal edema. Gastrointestinal system: Abdomen is nondistended, soft and nontender. No organomegaly or masses felt. Normal bowel sounds heard. Central nervous system: Alert and oriented. No focal neurological deficits. Extremities: Symmetric 5 x 5 power. Skin: No rashes, lesions or ulcers Psychiatry: Judgement and insight appear normal. Mood & affect appropriate.   Data Reviewed: I have personally reviewed following labs and imaging studies  CBC: Recent Labs  Lab 12/01/19 0546 12/02/19 0625 12/03/19 0500 12/04/19 0443 12/05/19 0414  WBC 9.1 12.7* 14.4* 15.5* 13.1*  NEUTROABS 8.4* 12.0* 13.5* 14.5* 12.4*  HGB 8.8* 9.2* 9.4* 9.1* 9.1*  HCT 26.9* 27.5* 29.0* 28.6* 27.9*  MCV 92.1 92.6 92.7 93.2 93.3  PLT 262 253 265 267 580    Basic Metabolic Panel: Recent Labs  Lab 11/30/19 0733 11/30/19 0733 12/01/19 0546 12/01/19 0546 12/02/19 0625 12/03/19 0500 12/04/19 0443 12/04/19 1245 12/05/19 0414  NA 134*   < > 135   < > 135 135 133* 130* 133*  K 3.6   < >  3.5   < > 4.1 3.7 3.7 3.8 3.9  CL 98   < > 97*   < > 100 98 95* 96* 96*  CO2 26   < > 28   < > 29 27 30 26 30   GLUCOSE 114*   < > 162*   < > 111* 154* 127* 120* 117*  BUN 16   < > 12   < > 13 18 17 17 19   CREATININE 0.44   < > 0.42*   < > 0.39* 0.45 0.43* 0.42* 0.49  CALCIUM 7.9*   < > 7.4*   < > 8.1* 8.1* 7.9* 7.8* 8.2*  MG 2.3   < > 2.0   < > 2.1 2.3 2.0 1.8 2.2  PHOS 3.4  --  3.3  --  3.0 3.0  --   --  3.7   < > = values in this interval not displayed.    GFR: Estimated Creatinine Clearance: 77.8 mL/min (by C-G formula based on SCr of 0.49  mg/dL).  Liver Function Tests: Recent Labs  Lab 12/01/19 0546 12/02/19 0625 12/03/19 0500 12/04/19 0443 12/05/19 0414  AST 29 26 32 43* 26  ALT 56* 47* 44 75* 58*  ALKPHOS 59 62 81 86 85  BILITOT 0.3 0.5 0.5 0.5 0.5  PROT 5.0* 5.0* 5.4* 4.9* 5.2*  ALBUMIN 2.2* 2.2* 2.2* 2.0* 2.1*    CBG: Recent Labs  Lab 12/04/19 1624 12/04/19 1940 12/04/19 2334 12/05/19 0343 12/05/19 0800  GLUCAP 97 150* 155* 118* 117*    Recent Results (from the past 240 hour(s))  Culture, blood (Routine X 2) w Reflex to ID Panel     Status: None   Collection Time: 11/28/19 12:15 PM   Specimen: BLOOD RIGHT FOREARM  Result Value Ref Range Status   Specimen Description   Final    BLOOD RIGHT FOREARM BOTTLES DRAWN AEROBIC AND ANAEROBIC   Special Requests Blood Culture adequate volume  Final   Culture   Final    NO GROWTH 5 DAYS Performed at [facility], [address],  [place], [place] [address]    Report Status 12/03/2019 FINAL  Final  MRSA PCR Screening     Status: None   Collection Time: 12/04/19 12:05 PM   Specimen: Nasopharyngeal  Result Value Ref Range Status   MRSA by PCR NEGATIVE NEGATIVE Final    Comment:        The GeneXpert MRSA Assay (FDA approved for NASAL specimens only), is one component of a comprehensive MRSA colonization surveillance program. It is not intended to diagnose MRSA infection nor to guide or monitor treatment for MRSA infections. Performed at [facility], [address] [place], [place] [address]      Radiology Studies: [facility] Chest Main [facility] 1 View  Result Date: 12/05/2019 CLINICAL DATA:  Hcap EXAM: PORTABLE CHEST 1 VIEW COMPARISON:  12/04/2019 chest radiograph and prior. FINDINGS: Bilateral pulmonary opacities are grossly unchanged. No pneumothorax. Trace right pleural effusion. Left costophrenic sulcus is obscured by overlying pacing pad. Cardiomediastinal silhouette is partially obscured. Left IJ CVC tip overlying the superior cavoatrial junction, unchanged.  Additional support devices demonstrates similar positioning. IMPRESSION: Multifocal pneumonia, unchanged. Stable appearance of support devices. Electronically Signed   By: Primitivo Gauze M.D.   On: 12/05/2019 07:00   DG Chest Port 1 View  Result Date: 12/04/2019 CLINICAL DATA:  Shortness of breath, pneumonia, COVID positive EXAM: PORTABLE CHEST 1 VIEW COMPARISON:  12/03/2019 FINDINGS: Multifocal patchy opacities with relative sparing of the  left upper lobe, grossly unchanged. No pleural effusion or pneumothorax. The heart is normal in size. Left IJ venous catheter terminating at the cavoatrial junction. Defibrillator pads overlying the left hemithorax. IMPRESSION: Multifocal pneumonia in this patient with known COVID, grossly unchanged. Electronically Signed   By: Julian Hy M.D.   On: 12/04/2019 14:29    Scheduled Meds:  Chlorhexidine Gluconate Cloth  6 each Topical Daily   dextromethorphan-guaiFENesin  1 tablet Oral BID   feeding supplement  237 mL Oral TID BM   furosemide  20 mg Oral Daily   insulin aspart  0-20 Units Subcutaneous Q4H   levothyroxine  75 mcg Oral QAC breakfast   methylPREDNISolone (SOLU-MEDROL) injection  60 mg Intravenous Q6H   metoCLOPramide  10 mg Oral TID AC   metoprolol tartrate  12.5 mg Oral BID   multivitamin with minerals  1 tablet Oral Daily   pantoprazole  40 mg Oral BID AC   polyethylene glycol  17 g Oral Daily   potassium chloride  10 mEq Oral Daily   potassium chloride  40 mEq Oral Once   saccharomyces boulardii  250 mg Oral BID   sucralfate  1 g Oral TID WC & HS   warfarin  2.5 mg Oral ONCE-1600   Warfarin - Pharmacist Dosing Inpatient   Does not apply q1600   Continuous Infusions:  anidulafungin     ceFEPime (MAXIPIME) IV 2 g (12/05/19 0631)   diltiazem (CARDIZEM) infusion 4 mg/hr (12/05/19 0400)   vancomycin       LOS: 7 days   Critical Care Procedure Note Authorized and Performed by: Murvin Natal MD  Total  Critical Care time: 40 mins Due to a high probability of clinically significant, life threatening deterioration, the patient required my highest level of preparedness to intervene emergently and I personally spent this critical care time directly and personally managing the patient.  This critical care time included obtaining a history; examining the patient, pulse oximetry; ordering and review of studies; arranging urgent treatment with development of a management plan; evaluation of patient's response of treatment; frequent reassessment; and discussions with other providers.  This critical care time was performed to assess and manage the high probability of imminent and life threatening deterioration that could result in multi-organ failure.  It was exclusive of separately billable procedures and treating other patients and teaching time.   Irwin Brakeman, MD How to contact the [facility] Attending or Consulting provider [place] or covering provider during after hours [place], for this patient?  1. Check the care team in [facility] and look for a) attending/consulting TRH provider listed and b) the [facility] team listed 2. Log into [email or web address] and use [place]'s universal password to access. If you do not have the password, please contact the hospital operator. 3. Locate the [facility] provider you are looking for under [facility] and page to a number that you can be directly reached. 4. If you still have difficulty reaching the provider, please page the [facility] (Director on Call) for the Hospitalists listed on amion for assistance.  12/05/2019, 10:02 AM

## 2019-12-06 ENCOUNTER — Inpatient Hospital Stay (HOSPITAL_COMMUNITY): Payer: BC Managed Care – PPO

## 2019-12-06 DIAGNOSIS — Z5181 Encounter for therapeutic drug level monitoring: Secondary | ICD-10-CM | POA: Diagnosis not present

## 2019-12-06 DIAGNOSIS — I471 Supraventricular tachycardia: Secondary | ICD-10-CM | POA: Diagnosis not present

## 2019-12-06 DIAGNOSIS — U071 COVID-19: Secondary | ICD-10-CM

## 2019-12-06 DIAGNOSIS — Z952 Presence of prosthetic heart valve: Secondary | ICD-10-CM | POA: Diagnosis not present

## 2019-12-06 DIAGNOSIS — I472 Ventricular tachycardia: Secondary | ICD-10-CM | POA: Diagnosis not present

## 2019-12-06 DIAGNOSIS — J9601 Acute respiratory failure with hypoxia: Secondary | ICD-10-CM | POA: Diagnosis not present

## 2019-12-06 DIAGNOSIS — R000 Tachycardia, unspecified: Secondary | ICD-10-CM

## 2019-12-06 LAB — PROTIME-INR
INR: 2.2 — ABNORMAL HIGH (ref 0.8–1.2)
Prothrombin Time: 23.4 seconds — ABNORMAL HIGH (ref 11.4–15.2)

## 2019-12-06 LAB — COMPREHENSIVE METABOLIC PANEL
ALT: 87 U/L — ABNORMAL HIGH (ref 0–44)
AST: 51 U/L — ABNORMAL HIGH (ref 15–41)
Albumin: 2.2 g/dL — ABNORMAL LOW (ref 3.5–5.0)
Alkaline Phosphatase: 104 U/L (ref 38–126)
Anion gap: 8 (ref 5–15)
BUN: 17 mg/dL (ref 6–20)
CO2: 28 mmol/L (ref 22–32)
Calcium: 8.1 mg/dL — ABNORMAL LOW (ref 8.9–10.3)
Chloride: 96 mmol/L — ABNORMAL LOW (ref 98–111)
Creatinine, Ser: 0.48 mg/dL (ref 0.44–1.00)
GFR, Estimated: >60 mL/min (ref >60)
Glucose, Bld: 137 mg/dL — ABNORMAL HIGH (ref 70–99)
Potassium: 4.1 mmol/L (ref 3.5–5.1)
Sodium: 132 mmol/L — ABNORMAL LOW (ref 135–145)
Total Bilirubin: 0.4 mg/dL (ref 0.3–1.2)
Total Protein: 5.1 g/dL — ABNORMAL LOW (ref 6.5–8.1)

## 2019-12-06 LAB — GLUCOSE, CAPILLARY
Glucose-Capillary: 136 mg/dL — ABNORMAL HIGH (ref 70–99)
Glucose-Capillary: 188 mg/dL — ABNORMAL HIGH (ref 70–99)
Glucose-Capillary: 95 mg/dL (ref 70–99)
Glucose-Capillary: 204 mg/dL — ABNORMAL HIGH (ref 70–99)
Glucose-Capillary: 146 mg/dL — ABNORMAL HIGH (ref 70–99)

## 2019-12-06 LAB — PHOSPHORUS: Phosphorus: 2.9 mg/dL (ref 2.5–4.6)

## 2019-12-06 LAB — BLOOD GAS, ARTERIAL
Acid-Base Excess: 4.7 mmol/L — ABNORMAL HIGH (ref 0.0–2.0)
Bicarbonate: 29 mmol/L — ABNORMAL HIGH (ref 20.0–28.0)
FIO2: 100
O2 Saturation: 72.3 %
Patient temperature: 36.9
pCO2 arterial: 45.3 mmHg (ref 32.0–48.0)
pH, Arterial: 7.421 (ref 7.350–7.450)
pO2, Arterial: 41.3 mmHg — ABNORMAL LOW (ref 83.0–108.0)

## 2019-12-06 LAB — CBC WITH DIFFERENTIAL/PLATELET
Abs Immature Granulocytes: 0.44 10*3/uL — ABNORMAL HIGH (ref 0.00–0.07)
Basophils Absolute: 0 10*3/uL (ref 0.0–0.1)
Basophils Relative: 0 %
Eosinophils Absolute: 0 10*3/uL (ref 0.0–0.5)
Eosinophils Relative: 0 %
HCT: 29 % — ABNORMAL LOW (ref 36.0–46.0)
Hemoglobin: 9.1 g/dL — ABNORMAL LOW (ref 12.0–15.0)
Immature Granulocytes: 2 %
Lymphocytes Relative: 1 %
Lymphs Abs: 0.2 10*3/uL — ABNORMAL LOW (ref 0.7–4.0)
MCH: 30.1 pg (ref 26.0–34.0)
MCHC: 31.4 g/dL (ref 30.0–36.0)
MCV: 96 fL (ref 80.0–100.0)
Monocytes Absolute: 0.4 10*3/uL (ref 0.1–1.0)
Monocytes Relative: 2 %
Neutro Abs: 20.2 10*3/uL — ABNORMAL HIGH (ref 1.7–7.7)
Neutrophils Relative %: 95 %
Platelets: 153 10*3/uL (ref 150–400)
RBC: 3.02 MIL/uL — ABNORMAL LOW (ref 3.87–5.11)
RDW: 14.6 % (ref 11.5–15.5)
WBC: 21.2 10*3/uL — ABNORMAL HIGH (ref 4.0–10.5)
nRBC: 0.1 % (ref 0.0–0.2)

## 2019-12-06 LAB — MAGNESIUM: Magnesium: 2.1 mg/dL (ref 1.7–2.4)

## 2019-12-06 LAB — SEDIMENTATION RATE: Sed Rate: 48 mm/hr — ABNORMAL HIGH (ref 0–22)

## 2019-12-06 LAB — PROCALCITONIN: Procalcitonin: <0.1 ng/mL

## 2019-12-06 LAB — C-REACTIVE PROTEIN: CRP: 6.9 mg/dL — ABNORMAL HIGH (ref <1.0)

## 2019-12-06 MED ORDER — ZOLPIDEM TARTRATE 5 MG PO TABS
10.0000 mg | ORAL_TABLET | Freq: Every day | ORAL | Status: DC
  Filled 2019-12-06: qty 2

## 2019-12-06 MED ORDER — PROPOFOL 1000 MG/100ML IV EMUL
5.0000 ug/kg/min | INTRAVENOUS | Status: DC
  Administered 2019-12-06: 10 ug/kg/min via INTRAVENOUS
  Administered 2019-12-07: 30 ug/kg/min via INTRAVENOUS
  Filled 2019-12-06: qty 100

## 2019-12-06 MED ORDER — ETOMIDATE 2 MG/ML IV SOLN
20.0000 mg | Freq: Once | INTRAVENOUS | Status: AC
  Administered 2019-12-06: 20 mg via INTRAVENOUS

## 2019-12-06 MED ORDER — AMIODARONE LOAD VIA INFUSION
150.0000 mg | Freq: Once | INTRAVENOUS | Status: AC
  Administered 2019-12-06: 150 mg via INTRAVENOUS
  Filled 2019-12-06: qty 83.34

## 2019-12-06 MED ORDER — WARFARIN SODIUM 2.5 MG PO TABS
2.5000 mg | ORAL_TABLET | Freq: Once | ORAL | Status: AC
  Administered 2019-12-06: 2.5 mg via ORAL
  Filled 2019-12-06: qty 1

## 2019-12-06 MED ORDER — ROCURONIUM BROMIDE 10 MG/ML (PF) SYRINGE
70.0000 mg | PREFILLED_SYRINGE | Freq: Once | INTRAVENOUS | Status: AC
  Administered 2019-12-06: 70 mg via INTRAVENOUS

## 2019-12-06 MED ORDER — FENTANYL BOLUS VIA INFUSION
50.0000 ug | INTRAVENOUS | Status: DC | PRN
  Administered 2019-12-07 (×2): 50 ug via INTRAVENOUS
  Filled 2019-12-06: qty 50

## 2019-12-06 MED ORDER — MAGNESIUM SULFATE 50 % IJ SOLN
2.0000 g | Freq: Once | INTRAMUSCULAR | Status: DC
  Filled 2019-12-06: qty 4

## 2019-12-06 MED ORDER — FENTANYL 2500MCG IN NS 250ML (10MCG/ML) PREMIX INFUSION
50.0000 ug/h | INTRAVENOUS | Status: DC
  Administered 2019-12-07: 100 ug/h via INTRAVENOUS
  Filled 2019-12-06: qty 250

## 2019-12-06 MED ORDER — AMIODARONE HCL IN DEXTROSE 360-4.14 MG/200ML-% IV SOLN: 30.0000 mg/h | INTRAVENOUS | Status: DC

## 2019-12-06 MED ORDER — PROPOFOL 1000 MG/100ML IV EMUL
INTRAVENOUS | Status: AC
  Filled 2019-12-06: qty 100

## 2019-12-06 MED ORDER — FUROSEMIDE 10 MG/ML IJ SOLN
40.0000 mg | Freq: Four times a day (QID) | INTRAMUSCULAR | Status: DC
  Administered 2019-12-06: 40 mg via INTRAVENOUS
  Filled 2019-12-06: qty 4

## 2019-12-06 MED ORDER — ALBUMIN HUMAN 25 % IV SOLN
25.0000 g | Freq: Four times a day (QID) | INTRAVENOUS | Status: DC
  Filled 2019-12-06 (×3): qty 100

## 2019-12-06 MED ORDER — MAGNESIUM SULFATE 2 GM/50ML IV SOLN
2.0000 g | Freq: Once | INTRAVENOUS | Status: AC
  Administered 2019-12-06: 2 g via INTRAVENOUS
  Filled 2019-12-06: qty 50

## 2019-12-06 MED ORDER — FENTANYL CITRATE (PF) 100 MCG/2ML IJ SOLN
50.0000 ug | Freq: Once | INTRAMUSCULAR | Status: AC
  Administered 2019-12-07: 50 ug via INTRAVENOUS
  Filled 2019-12-06: qty 2

## 2019-12-06 MED ORDER — ATROPINE SULFATE 1 MG/10ML IJ SOSY
1.0000 mg | PREFILLED_SYRINGE | Freq: Once | INTRAMUSCULAR | Status: AC
  Administered 2019-12-06: 1 mg via INTRAVENOUS

## 2019-12-06 MED ORDER — EPINEPHRINE 1 MG/10ML IJ SOSY
0.2000 mg | PREFILLED_SYRINGE | Freq: Once | INTRAMUSCULAR | Status: AC
  Administered 2019-12-06: 0.2 mg via INTRAVENOUS

## 2019-12-06 MED ORDER — ROPINIROLE HCL 1 MG PO TABS
1.0000 mg | ORAL_TABLET | Freq: Every day | ORAL | Status: DC
  Filled 2019-12-06: qty 1

## 2019-12-06 MED ORDER — ZOLPIDEM TARTRATE 5 MG PO TABS
10.0000 mg | ORAL_TABLET | Freq: Once | ORAL | Status: AC
  Administered 2019-12-06: 10 mg via ORAL
  Filled 2019-12-06: qty 2

## 2019-12-06 MED ORDER — AMIODARONE HCL IN DEXTROSE 360-4.14 MG/200ML-% IV SOLN
60.0000 mg/h | INTRAVENOUS | Status: DC
  Administered 2019-12-06 (×2): 60 mg/h via INTRAVENOUS
  Filled 2019-12-06 (×2): qty 200

## 2019-12-06 MED ORDER — FUROSEMIDE 10 MG/ML IJ SOLN
20.0000 mg | Freq: Once | INTRAMUSCULAR | Status: AC
  Administered 2019-12-06: 20 mg via INTRAVENOUS
  Filled 2019-12-06: qty 2

## 2019-12-06 NOTE — Progress Notes (Signed)
Pt requesting "more air" noted increase of work of breathing, SOB at rest. Pt more sleepy with eyes rolling back.   Repositioned pt.  Increased Heated HF from 45 to 60.  Pt still c/o of SOB and needing more oxygen.  Mindy RRT called to bedside.  CCM notified of change of status.  Attempted biPap unsuccessfully.  Pt decompensated, became unresponsive.  CCM at bedside and intubated with RT and RRT assisting.  Pt transferred to 2M01, report given bedside.  Daugther present and updated on status.  Daughter agreeable to plan of care.

## 2019-12-06 NOTE — Significant Event (Signed)
Rapid Response Event Note   Reason for Call :  Second set of eyes  Initial Focused Assessment:  This patient was coming over from [place] for worsening respiratory status.  Patient had gone into SVT previously at [facility] and was given adenosine and digoxin to convert back into NSR.  Patient was transported on cardizem gtt.  She was in NSR when she got to the unit, AOx4, and 35L HHF 100% and 16 L NRB.  Patient was not complaining of SOB or chest pain.    Addendum: I received a call that the patient had gone back into SVT at 1604.     Interventions:  Zoll pads placed on patient  Plan of Care:  Patient will remain on unit.  Amiodarone ordered and cardizem dc'd.   Event Summary:   MD Notified: Cards and PCCM Call Time: 1438 Arrival Time: 8875 End Time: [place]  Venetia Maxon, RN

## 2019-12-06 NOTE — Progress Notes (Signed)
Called to bedside to place pt on Bipap for transfer to 26M. When attemptin gto switch pt from [facility] to Bipap pt became unresponsive with decreased HR and SATS. PT was intubated at bedside and transferred to ICU.

## 2019-12-06 NOTE — Progress Notes (Addendum)
The patient has been seen in conjunction with Roby Lofts, PA-C. All aspects of care have been considered and discussed. The patient has been personally interviewed, examined, and all clinical data has been reviewed.   Patient has respiratory failure with progressive hypoxia.  Background is that of immunosuppression due to Rituxan in the setting of systemic lupus erythematosus and recurrent hospital visits due to hypoxia after developing COVID-19 infection.  Most recently, her hospital course at [facility] has been complicated by initially narrow complex SVT up to 210 bpm.  All tracings were reviewed, 1 demonstrated wide-complex tachycardia that appeared to be most likely aberration related to rate.  IV diltiazem has somewhat control recurrent arrhythmia.  Current blood pressure is in the high 46F systolic.  Patient is arousable but falls back asleep.  O2 sats are in the 70s on high flow oxygen.  She has significant leukocytosis.  OVERALL: Patient is critically ill with ongoing hypoxia and intermittent episodes of narrow complex and wide complex tachycardia (aberrancy) in excess of 200 bpm.  Continue diltiazem drip.  Other options might include IV amiodarone, however amnio could also lower the patient's blood pressure.    Progress Note  Patient Name: [name] Date of Encounter: 12/06/2019  Primary Cardiologist: Rozann Lesches, MD   Subjective   Patient transferred from [facility] to [facility] for worsening respiratory status. On arrival she was hypoxic to the 70s despite O2 via [place]. RN called rapid response. O2 monitoring is complicated by raynauds and it has been difficult to get a good wave form on her ears/forehead. CXR showed worsening bilateral interstitial and airspace edema, most confluent in the LML and LLL.   Cardiology notified of arrival as requested by medicine team. Seen by Dr. Harrington Challenger at [facility] for SVT, improved with diltiazem gtt.   At this time she reports some SOB  but no complaints of chest pain or palpitations today. Thinks LE edema is a bit worse today.   Inpatient Medications    Scheduled Meds: . Chlorhexidine Gluconate Cloth  6 each Topical Daily  . dextromethorphan-guaiFENesin  1 tablet Oral BID  . feeding supplement  237 mL Oral TID BM  . furosemide  20 mg Oral Daily  . insulin aspart  0-20 Units Subcutaneous TID WC  . levothyroxine  75 mcg Oral QAC breakfast  . methylPREDNISolone (SOLU-MEDROL) injection  60 mg Intravenous Q6H  . metoCLOPramide  10 mg Oral TID AC  . multivitamin with minerals  1 tablet Oral Daily  . pantoprazole  40 mg Oral BID AC  . polyethylene glycol  17 g Oral Daily  . potassium chloride  10 mEq Oral Daily  . potassium chloride  40 mEq Oral Once  . saccharomyces boulardii  250 mg Oral BID  . sucralfate  1 g Oral TID WC & HS  . warfarin  2.5 mg Oral ONCE-1600  . Warfarin - Pharmacist Dosing Inpatient   Does not apply q1600   Continuous Infusions: . anidulafungin Stopped (12/05/19 1813)  . ceFEPime (MAXIPIME) IV 2 g (12/06/19 0617)  . diltiazem (CARDIZEM) infusion 4 mg/hr (12/06/19 0214)   PRN Meds: acetaminophen, ALPRAZolam, chlorpheniramine-HYDROcodone, levalbuterol, promethazine, sodium chloride, traMADol   Vital Signs    Vitals:   12/06/19 0816 12/06/19 0900 12/06/19 1000 12/06/19 1500  BP:   119/67 105/78  Pulse:  (!) 123 100 97  Resp:  (!) 28 (!) 31 (!) 30  Temp: 98 F (36.7 C)   98.4 F (36.9 C)  TempSrc:  Oral  SpO2:  93% (!) 72% (!) 77%  Weight:      Height:        Intake/Output Summary (Last 24 hours) at 12/06/2019 1550 Last data filed at 12/06/2019 1300 Gross per 24 hour  Intake 866.32 ml  Output 1250 ml  Net -383.68 ml   Filed Weights   11/30/19 0008  Weight: 66 kg    Telemetry    Sinus rhythm/ sinus tachcyardia with 1 brief episode of NSVT (~7 beats), occasional PVCs - Personally Reviewed  ECG    No new tracings - Personally Reviewed  Physical Exam   GEN: No acute  distress.   Neck: No JVD, no carotid bruits Cardiac: RRR, no murmurs, rubs, or gallops.  Respiratory: on high flow O2 via NRB mask; coarse breath sounds with scattered wheezing/rhonchi GI: NABS, Soft, nontender, non-distended  MS: trace-1+ LE edema; No deformity. Neuro:  Nonfocal, moving all extremities spontaneously Psych: Normal affect   Labs    Chemistry Recent Labs  Lab 12/04/19 0443 12/04/19 0443 12/04/19 1245 12/05/19 0414 12/06/19 0529  NA 133*   < > 130* 133* 132*  K 3.7   < > 3.8 3.9 4.1  CL 95*   < > 96* 96* 96*  CO2 30   < > 26 30 28   GLUCOSE 127*   < > 120* 117* 137*  BUN 17   < > 17 19 17   CREATININE 0.43*   < > 0.42* 0.49 0.48  CALCIUM 7.9*   < > 7.8* 8.2* 8.1*  PROT 4.9*  --   --  5.2* 5.1*  ALBUMIN 2.0*  --   --  2.1* 2.2*  AST 43*  --   --  26 51*  ALT 75*  --   --  58* 87*  ALKPHOS 86  --   --  85 104  BILITOT 0.5  --   --  0.5 0.4  GFRNONAA >60   < > >60 >60 >60  ANIONGAP 8   < > 8 7 8    < > = values in this interval not displayed.     Hematology Recent Labs  Lab 12/04/19 0443 12/05/19 0414 12/06/19 0529  WBC 15.5* 13.1* 21.2*  RBC 3.07* 2.99* 3.02*  HGB 9.1* 9.1* 9.1*  HCT 28.6* 27.9* 29.0*  MCV 93.2 93.3 96.0  MCH 29.6 30.4 30.1  MCHC 31.8 32.6 31.4  RDW 14.2 14.6 14.6  PLT 267 172 153    Cardiac EnzymesNo results for input(s): TROPONINI in the last 168 hours. No results for input(s): TROPIPOC in the last 168 hours.   BNPNo results for input(s): BNP, PROBNP in the last 168 hours.   DDimer  Recent Labs  Lab 12/04/19 0443  DDIMER 4.77*     Radiology    DG Chest Port 1 View  Result Date: 12/06/2019 CLINICAL DATA:  Acute respiratory distress EXAM: PORTABLE CHEST 1 VIEW COMPARISON:  12/05/2019 FINDINGS: Left IJ central venous catheter tip projects over the lower SVC. Faint wires suggesting epicardial pacer leads project over the right heart. Mildly worsened bilateral interstitial and airspace edema, most confluent in the left mid  lung and left lung base (taking into account the left unilateral breast implant which does cause increased density over the left chest). Heart size within normal limits. IMPRESSION: 1. Mildly worsened bilateral interstitial and airspace edema, most confluent in the left mid lung and left lung base. Electronically Signed   By: Van Clines M.D.   On: 12/06/2019 15:28  DG Chest Port 1 View  Result Date: 12/05/2019 CLINICAL DATA:  Hcap EXAM: PORTABLE CHEST 1 VIEW COMPARISON:  12/04/2019 chest radiograph and prior. FINDINGS: Bilateral pulmonary opacities are grossly unchanged. No pneumothorax. Trace right pleural effusion. Left costophrenic sulcus is obscured by overlying pacing pad. Cardiomediastinal silhouette is partially obscured. Left IJ CVC tip overlying the superior cavoatrial junction, unchanged. Additional support devices demonstrates similar positioning. IMPRESSION: Multifocal pneumonia, unchanged. Stable appearance of support devices. Electronically Signed   By: Primitivo Gauze M.D.   On: 12/05/2019 07:00   ECHOCARDIOGRAM COMPLETE  Result Date: 12/05/2019    ECHOCARDIOGRAM REPORT   Patient Name:   ANNSLEIGH DRAGOO Date of Exam: 12/05/2019 Medical Rec #:  [id number]       Height:       64.0 in Accession #:    [id number]      Weight:       145.5 lb Date of Birth:  03/08/1970        BSA:          1.709 m Patient Age:    71 years        BP:           101/64 mmHg Patient Gender: F               HR:           70 bpm. Exam Location:  [facility] Procedure: 2D Echo Indications:    Abnormal ECG 794.31 / R94.31  History:        Patient has prior history of Echocardiogram examinations, most                 recent 08/20/2011. Pneumonia due to COVID-19 virus, History of                 breast cancer, SVT , Acute respiratory failure with hypoxia ,                 Mitral Valve Replacement.  Sonographer:    Leavy Cella RDCS (AE) Referring Phys: [id number] VINEET SOOD  Sonographer Comments: Image acquisition  challenging due to breast implants and restricted mobility. IMPRESSIONS  1. Difficult to assess wall motion due to poor visualization. Left ventricular ejection fraction, by estimation, is 55 to 60%. The left ventricle has normal function. The left ventricle has no regional wall motion abnormalities. There is mild concentric  left ventricular hypertrophy. diastolic function is indeterminant due to mechanical mitral valve.  2. Right ventricular systolic function is mildly reduced. The right ventricular size is normal.  3. A [employer]. Jude mechanical mitral valve prosthesis is partially visualized. Appears well seated based on limted views. Mean gradient 28mmHg at HR 67. No evidence of mitral valve regurgitation.  4. The aortic valve is tricuspid. Aortic valve regurgitation is trivial.  5. The inferior vena cava is normal in size with greater than 50% respiratory variability, suggesting right atrial pressure of 3 mmHg. Comparison(s): No significant change from prior study. FINDINGS  Left Ventricle: Difficult to assess wall motion due to poor visualization. Left ventricular ejection fraction, by estimation, is 55 to 60%. The left ventricle has normal function. The left ventricle has no regional wall motion abnormalities. The left ventricular internal cavity size was normal in size. There is mild concentric left ventricular hypertrophy. Diastolic function is indeterminant due to mechanical mitral valve. Right Ventricle: The right ventricular size is normal. Right vetricular wall thickness was not well visualized. Right ventricular systolic function is mildly reduced. Left  Atrium: Left atrial size was not well visualized. Right Atrium: Right atrial size was normal in size. Pericardium: There is no evidence of pericardial effusion. Mitral Valve: A [employer]. Jude mechanical mitral valve prosthesis is partially visualized. Appears well seated based on limted view. Mean gradient 61mmHg at HR 67. No evidence of mitral valve regurgitation.  MV peak gradient, 6.2 mmHg. Tricuspid Valve: The tricuspid valve is normal in structure. Tricuspid valve regurgitation is mild. Aortic Valve: The aortic valve is tricuspid. Aortic valve regurgitation is trivial. Pulmonic Valve: The pulmonic valve was grossly normal. Pulmonic valve regurgitation is trivial. Aorta: The aortic root is normal in size and structure. Venous: The inferior vena cava is normal in size with greater than 50% respiratory variability, suggesting right atrial pressure of 3 mmHg. IAS/Shunts: The atrial septum is grossly normal.  LEFT VENTRICLE PLAX 2D LVIDd:         3.25 cm  Diastology LVIDs:         2.33 cm  LV e' medial:    4.79 cm/s LV PW:         1.21 cm  LV E/e' medial:  16.7 LV IVS:        1.25 cm  LV e' lateral:   6.64 cm/s LVOT diam:     2.00 cm  LV E/e' lateral: 12.1 LVOT Area:     3.14 cm  RIGHT VENTRICLE TAPSE (M-mode): 1.6 cm LEFT ATRIUM           Index       RIGHT ATRIUM          Index LA diam:      3.70 cm 2.17 cm/m  RA Area:     9.38 cm LA Vol (A2C): 32.7 ml 19.13 ml/m RA Volume:   18.40 ml 10.77 ml/m LA Vol (A4C): 59.3 ml 34.70 ml/m   AORTA Ao Root diam: 3.50 cm MITRAL VALVE               TRICUSPID VALVE MV Area (PHT): 3.68 cm    TR Peak grad:   39.9 mmHg MV Peak grad:  6.2 mmHg    TR Vmax:        316.00 cm/s MV Mean grad:  2.0 mmHg MV Vmax:       1.25 m/s    SHUNTS MV Vmean:      68.6 cm/s   Systemic Diam: 2.00 cm MV Decel Time: 206 msec MV E velocity: 80.10 cm/s MV A velocity: 69.40 cm/s MV E/A ratio:  1.15 Gwyndolyn Kaufman MD Electronically signed by Gwyndolyn Kaufman MD Signature Date/Time: 12/05/2019/5:35:34 PM    Final     Cardiac Studies   Echocardiogram 12/05/19: 1. Difficult to assess wall motion due to poor visualization. Left  ventricular ejection fraction, by estimation, is 55 to 60%. The left  ventricle has normal function. The left ventricle has no regional wall  motion abnormalities. There is mild concentric  left ventricular hypertrophy. diastolic  function is indeterminant due to  mechanical mitral valve.  2. Right ventricular systolic function is mildly reduced. The right  ventricular size is normal.  3. A [employer]. Jude mechanical mitral valve prosthesis is partially visualized.  Appears well seated based on limted views. Mean gradient 27mmHg at HR 67.  No evidence of mitral valve regurgitation.  4. The aortic valve is tricuspid. Aortic valve regurgitation is trivial.  5. The inferior vena cava is normal in size with greater than 50%  respiratory variability, suggesting right atrial pressure of 3  mmHg.   Comparison(s): No significant change from prior study.   Patient Profile     49 y.o. 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  Assessment & Plan    1. SVT: isolated occurrence 12/04/19, ultimately converted to sinus after receiving adenosine x2, IV digoxin, and started on a diltiazem gtt. Brief episode of NSVT ~7 beats noted on telemetry since arrival to [facility]. Likely driven by underlying infection.  - Continue diltiazem gtt for now - Continue to monitor closely on telemetry  2. 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 INR initially supratherapeutic this admission.  INR 2.2 today, slightly subtherapeutic - goal INR 2.5-3.5. May need to consider bridging if remains subtherapeutic.  - Continue coumadin per pharmacy  3. Acute hypoxic respiratory failure in the setting of recent COVID-19: ongoing primary issue this admission. Likely stemming from her COVID-19 infection; diagnosed 11/09/19. She has had worsening hypoxia and dyspnea despite steroids and remdesivir. Some concer for superimposed bacterial/fungal PNA. She was started on baricitinib (stopped 10/15). Currently on IV antifungal and IV  antibiotics. O2 sats remain low, though having difficulty maintaining good wave form on pulse ox monitor.  - Continue management per primary team and PCCM.  - Will give a dose of IV lasix 20mg  this afternoon for possible volume overload given mild increase in LE edema today.   Remainder of care per primary team: - SLE - RA - Raynauds - Gastroparesis  For questions or updates, please contact [place] Please consult [email or web address] for contact info under Cardiology/STEMI.      Signed, Abigail Butts, PA-C  12/06/2019, 3:50 PM   [phone]

## 2019-12-06 NOTE — Progress Notes (Signed)
ANTICOAGULATION CONSULT NOTE -   Pharmacy Consult for:  warfarin dosing Indication:  Mechanical valve  Vital Signs:  Temp: 97.9 F (36.6 C) (10/17 0400) Temp Source: Oral (10/17 0400) BP: 125/94 (10/17 0300) Pulse Rate: 85 (10/17 0600)  Labs: Recent Labs    12/04/19 0443 12/04/19 0443 12/04/19 1245 12/05/19 0414 12/05/19 0545 12/06/19 0529  HGB 9.1*   < >  --  9.1*  --  9.1*  HCT 28.6*  --   --  27.9*  --  29.0*  PLT 267  --   --  172  --  153  LABPROT 35.8*  --   --   --  25.6* 23.4*  INR 3.7*  --   --   --  2.4* 2.2*  CREATININE 0.43*   < > 0.42* 0.49  --  0.48   < > = values in this interval not displayed.    Estimated Creatinine Clearance: 77.8 mL/min (by C-G formula based on SCr of 0.48 mg/dL).    Assessment: Pharmacy consulted to dose warfarin  for this 49 yo female on chronic anti-coagulation therapy with warfarin for history of mechanical valve .  Patient was admitted with high INR of 4.1 on 10/7 and took her last dose of warfarin 2.5mg  on 11/26/19.   Home warfarin dose:  5mg  on Sun/Mon/Wed/Fri and 7.5mg  on Tues/Thurs/Sat Dose recently reduced to 2.5mg  x 3 days and several doses were held last week.  12/02/19 update INR: 3.0>3.7> 2.4>2.2 (held dose 10/15) Hb: 9.1     Goal of Therapy:  INR Goal: 2.5-3.5 Monitor platelets by anticoagulation protocol: Yes   Plan:  Warfarin 2.5 mg x 1 dose. Daily INR and every other day CBC Monitor for signs and symptoms of bleeding.  Margot Ables, PharmD Clinical Pharmacist 12/06/2019 9:49 AM

## 2019-12-06 NOTE — Progress Notes (Signed)
PROGRESS NOTE   [name]  RSW:[id number] DOB: [birth year] DOA: 12/14/2019 PCP: Sharilyn Sites, MD   Chief Complaint  Patient presents with  . Shortness of Breath    Brief Admission History:  49 y.o.femalewith medical history significant forSLE, rheumatoid arthritis, Raynaud's, hypothyroidism, and gastroparesis with persistent nausea as well as mitral valve replacement on chronic anticoagulation and recent COVID-19 infection/pneumonia --- recently discharged home with home O2 on 11/26/19 ---Readmitted on 11/28/2019 with worsening hypoxia and dyspnea and significant tachycardia and tachypnea--- -patient is immunocompromised due to immunosuppression for SLE and rheumatoid arthritis -She was not vaccinated for covid 19.    Assessment & Plan:   Acute respiratory failuredue to progression of Covid pneumonia versus HCAP.   -readmitted on 11/28/2019 with worsening hypoxia dyspnea and significant tachycardia and tachypnea --despite completing treatment with steroids and remdesivir.  -CTA chest on 11/28/2019 with bilateral groundglassopacities consistent with Covid pneumonia along with elevated pulmonary artery pressures but no evidence of PE  Discussed with pulmonologist and concern is that patient has superimposed bacterial / fungal pneumonia.  Pt was started on broad spectrum coverage by pulmonary critical care team 12/04/19.  DC VANC as MRSA Screen is Negative.   -Dyspnea and hypoxia worsening ---patient with increased oxygen requirements--requiring up to 25 L of oxygen via nasal cannula PLUS NRB -Procalcitonin is up to 0.34 >> 0.13 -Mucolytics, inhalers as needed -Patient has already completed course of remdesivir on recent hospitalization -Continue bronchodilators and mucolytics -Continue IV Solu-Medrol  Rheumatoid arthritis, SLE, immunosuppression-stable. -Hold rituximab -Currently on steroids for Covid pneumonia -As she has been off her immunosuppressants for a while,patient  should discuss with herrheumatologist on discharge if she can get the Covid vaccine in the near future  Mitral valve replacement with mechanical valve, supratherapeutic INR Her anticoagulation goal INR is 2.5-3.5.  Hemoglobin is stable no sign of bleeding. -INR - therapeutic, warfarin 2.5 mg daily.  -Patient had an episode of nosebleed on 11/29/2019, no recurrence of epistaxis -Pharmacy to manage warfarin and following daily PT/INR.    Acute on chronic anemia--hemoglobin is down to 8.8 from a baseline around 11- -patient is an episode of nosebleed on 11/29/2019 -Monitoring closely  -Possible UTI--- UA from 11/29/2019 suspicious for UTI, urine culture was not obtained,  patient was empirically treated with  IV Rocephin for total of 3 days -Patient had fever on presentation on 11/28/2019, no further fevers  Gastroparesis/GERD Complains of increasing nausea and heartburn okay to increase Reglan, may use as needed Phenergan --Suspect GI symptoms are worsened due to steroid therapy -Continue Protonix  Hypothyroidism Continuehome Synthroid  -Hypokalemia and hypophosphatemia---Repleted: Follow.   SVT/tachyarrhythmia--- tachycardia on admission - on 10/15 Pt developed recurrent SVT HR 220s, she was given several doses of adenosine but SVT rapidly returned, started on low dose cardizem with return of SVT, cardiology consulted Dr Harrington Challenger at bedside, given bolus dose of cardizem and IV digoxin and Dr. Harrington Challenger spoke with Dr. Halford Chessman and recommended patient transfer to ICU at [facility].  Dr. Harrington Challenger says there are 4 ICU beds available now at [facility].  Family asked for transfer to [facility]. Spoke with [facility] transfer line and they informed [name] that patient would have to be wait-listed. Family then decided that they wanted her to go to [facility].  Pt remains on IV cardizem but no recurrence of SVT.    Following closely.  Pt to transfer to [facility] when bed available for cardiology and pccm.   Elevated transaminitis AST 58>>  35>>39>.29 ALT 71>>47>>63>>58  Hypoalbuminemia secondary  to moderate protein calorie malnutrition Albumin 2.4;this is possibly due to decreased appetite secondary to Covid 19 pneumonia Dietary Consult appreciated  -Continue nutritional supplements  Code Status:Full code Family Communication:daughter and sister at bedside updated  Consults called:cardiology and pulmonology (intensivist)   Principal Problem:   Acute respiratory failure with hypoxia ([place]) Active Problems:   History of mitral valve replacement with mechanical valve   Systemic lupus erythematosus (HCC)   Anticoagulation goal of INR 2.5 to 3.5   Gastroparesis   Primary hypothyroidism   Pneumonia due to COVID-19 virus   Hypokalemia   Hypophosphatemia   Transaminitis   Hypoalbuminemia   Acute respiratory disease due to COVID-19 virus   Protein-calorie malnutrition, severe   COVID-19 virus infection   SVT (supraventricular tachycardia) ([place])  Status is: Inpatient  Remains inpatient appropriate because:IV treatments appropriate due to intensity of illness or inability to take PO and Inpatient level of care appropriate due to severity of illness   Dispo: The patient is from: Home              Anticipated d/c is to: Home              Anticipated d/c date is: > 3 days              Patient currently is not medically stable to d/c. Consultants:   Dr. Harrington Challenger cardiology  Dr. Halford Chessman pulmonology/intensivist  Procedures:     Antimicrobials:    Anti-infectives (From admission, onward)   Start     Dose/Rate Route Frequency Ordered Stop   12/05/19 1500  anidulafungin (ERAXIS) 100 mg in sodium chloride 0.9 % 100 mL IVPB       "Followed by" Linked Group Details   100 mg 78 mL/hr over 100 Minutes Intravenous Every 24 hours 12/04/19 1405     12/05/19 1500  vancomycin (VANCOREADY) IVPB 1250 mg/250 mL  Status:  Discontinued        1,250 mg 166.7 mL/hr over 90 Minutes Intravenous Every 24 hours 12/04/19 1502 12/06/19  1012   12/04/19 1500  anidulafungin (ERAXIS) 200 mg in sodium chloride 0.9 % 200 mL IVPB       "Followed by" Linked Group Details   200 mg 78 mL/hr over 200 Minutes Intravenous  Once 12/04/19 1405 12/04/19 1840   12/04/19 1500  ceFEPIme (MAXIPIME) 2 g in sodium chloride 0.9 % 100 mL IVPB        2 g 200 mL/hr over 30 Minutes Intravenous Every 8 hours 12/04/19 1411     12/04/19 1500  vancomycin (VANCOREADY) IVPB 1500 mg/300 mL        1,500 mg 150 mL/hr over 120 Minutes Intravenous  Once 12/04/19 1413 12/04/19 1646   12/03/19 1430  amoxicillin-clavulanate (AUGMENTIN) 875-125 MG per tablet 1 tablet  Status:  Discontinued        1 tablet Oral Every 12 hours 12/03/19 1339 12/04/19 1409   12/01/19 0400  cefTRIAXone (ROCEPHIN) 2 g in sodium chloride 0.9 % 100 mL IVPB        2 g 200 mL/hr over 30 Minutes Intravenous Every 24 hours 11/30/19 1518 12/01/19 0700   11/29/19 0400  cefTRIAXone (ROCEPHIN) 2 g in sodium chloride 0.9 % 100 mL IVPB  Status:  Discontinued        2 g 200 mL/hr over 30 Minutes Intravenous Every 24 hours 11/28/19 1427 11/30/19 1518   11/28/19 1500  azithromycin (ZITHROMAX) 500 mg in sodium chloride 0.9 % 250 mL IVPB  500 mg 250 mL/hr over 60 Minutes Intravenous Every 24 hours 11/28/19 1427 12/01/19 1624   11/28/19 0500  azithromycin (ZITHROMAX) tablet 500 mg        500 mg Oral  Once 11/28/19 0451 11/28/19 0515   11/28/19 0300  cefTRIAXone (ROCEPHIN) 2 g in sodium chloride 0.9 % 100 mL IVPB        2 g 200 mL/hr over 30 Minutes Intravenous  Once 11/28/19 0249 11/28/19 0501   11/28/19 0300  azithromycin (ZITHROMAX) 500 mg in sodium chloride 0.9 % 250 mL IVPB  Status:  Discontinued        500 mg 250 mL/hr over 60 Minutes Intravenous Every 24 hours 11/28/19 0249 11/28/19 0451     Subjective: Pt says she is starting to have productive cough, whitish sputum production, denies chest pain and palpitations        Objective: Vitals:   12/06/19 0800 12/06/19 0816 12/06/19  0900 12/06/19 1000  BP: 134/78   119/67  Pulse: 83  (!) 123 100  Resp: (!) 28  (!) 28 (!) 31  Temp:  98 F (36.7 C)    TempSrc:      SpO2: (!) 69%  93% (!) 72%  Weight:      Height:        Intake/Output Summary (Last 24 hours) at 12/06/2019 1132 Last data filed at 12/05/2019 2300 Gross per 24 hour  Intake 1066.32 ml  Output 1350 ml  Net -283.68 ml   Filed Weights   11/30/19 0008  Weight: 66 kg   Examination:  General exam: Pt appears weak and frail. Pt more alert and talkative today.   Respiratory system: bilateral rales heard.   Cardiovascular system: normal S1 & S2 heard. No JVD, murmurs, rubs, gallops or clicks. No pedal edema. Gastrointestinal system: Abdomen is nondistended, soft and nontender. No organomegaly or masses felt. Normal bowel sounds heard. Central nervous system: Alert and oriented. No focal neurological deficits. Extremities: Symmetric 5 x 5 power. Skin: No rashes, lesions or ulcers Psychiatry: Judgement and insight appear normal. Mood & affect appropriate.   Data Reviewed: I have personally reviewed following labs and imaging studies  CBC: Recent Labs  Lab 12/02/19 0625 12/03/19 0500 12/04/19 0443 12/05/19 0414 12/06/19 0529  WBC 12.7* 14.4* 15.5* 13.1* 21.2*  NEUTROABS 12.0* 13.5* 14.5* 12.4* 20.2*  HGB 9.2* 9.4* 9.1* 9.1* 9.1*  HCT 27.5* 29.0* 28.6* 27.9* 29.0*  MCV 92.6 92.7 93.2 93.3 96.0  PLT 253 265 267 172 735    Basic Metabolic Panel: Recent Labs  Lab 12/01/19 0546 12/01/19 0546 12/02/19 0625 12/02/19 0625 12/03/19 0500 12/04/19 0443 12/04/19 1245 12/05/19 0414 12/06/19 0529  NA 135   < > 135   < > 135 133* 130* 133* 132*  K 3.5   < > 4.1   < > 3.7 3.7 3.8 3.9 4.1  CL 97*   < > 100   < > 98 95* 96* 96* 96*  CO2 28   < > 29   < > 27 30 26 30 28   GLUCOSE 162*   < > 111*   < > 154* 127* 120* 117* 137*  BUN 12   < > 13   < > 18 17 17 19 17   CREATININE 0.42*   < > 0.39*   < > 0.45 0.43* 0.42* 0.49 0.48  CALCIUM 7.4*   < >  8.1*   < > 8.1* 7.9* 7.8* 8.2* 8.1*  MG 2.0   < >  2.1   < > 2.3 2.0 1.8 2.2 2.1  PHOS 3.3  --  3.0  --  3.0  --   --  3.7 2.9   < > = values in this interval not displayed.    GFR: Estimated Creatinine Clearance: 77.8 mL/min (by C-G formula based on SCr of 0.48 mg/dL).  Liver Function Tests: Recent Labs  Lab 12/02/19 0625 12/03/19 0500 12/04/19 0443 12/05/19 0414 12/06/19 0529  AST 26 32 43* 26 51*  ALT 47* 44 75* 58* 87*  ALKPHOS 62 81 86 85 104  BILITOT 0.5 0.5 0.5 0.5 0.4  PROT 5.0* 5.4* 4.9* 5.2* 5.1*  ALBUMIN 2.2* 2.2* 2.0* 2.1* 2.2*    CBG: Recent Labs  Lab 12/05/19 1207 12/05/19 1637 12/05/19 2052 12/06/19 0804 12/06/19 1123  GLUCAP 132* 171* 131* 136* 188*    Recent Results (from the past 240 hour(s))  Culture, blood (Routine X 2) w Reflex to ID Panel     Status: None   Collection Time: 11/28/19 12:15 PM   Specimen: BLOOD RIGHT FOREARM  Result Value Ref Range Status   Specimen Description   Final    BLOOD RIGHT FOREARM BOTTLES DRAWN AEROBIC AND ANAEROBIC   Special Requests Blood Culture adequate volume  Final   Culture   Final    NO GROWTH 5 DAYS Performed at [facility], [address], [place], [place] [address]    Report Status 12/03/2019 FINAL  Final  MRSA PCR Screening     Status: None   Collection Time: 12/04/19 12:05 PM   Specimen: Nasopharyngeal  Result Value Ref Range Status   MRSA by PCR NEGATIVE NEGATIVE Final    Comment:        The GeneXpert MRSA Assay (FDA approved for NASAL specimens only), is one component of a comprehensive MRSA colonization surveillance program. It is not intended to diagnose MRSA infection nor to guide or monitor treatment for MRSA infections. Performed at [facility], [address] [place], [place] [address]      Radiology Studies: [facility] Chest [facility] 1 View  Result Date: 12/05/2019 CLINICAL DATA:  Hcap EXAM: PORTABLE CHEST 1 VIEW COMPARISON:  12/04/2019 chest radiograph and prior. FINDINGS: Bilateral  pulmonary opacities are grossly unchanged. No pneumothorax. Trace right pleural effusion. Left costophrenic sulcus is obscured by overlying pacing pad. Cardiomediastinal silhouette is partially obscured. Left IJ CVC tip overlying the superior cavoatrial junction, unchanged. Additional support devices demonstrates similar positioning. IMPRESSION: Multifocal pneumonia, unchanged. Stable appearance of support devices. Electronically Signed   By: Primitivo Gauze M.D.   On: 12/05/2019 07:00   DG Chest Port 1 View  Result Date: 12/04/2019 CLINICAL DATA:  Shortness of breath, pneumonia, COVID positive EXAM: PORTABLE CHEST 1 VIEW COMPARISON:  12/03/2019 FINDINGS: Multifocal patchy opacities with relative sparing of the left upper lobe, grossly unchanged. No pleural effusion or pneumothorax. The heart is normal in size. Left IJ venous catheter terminating at the cavoatrial junction. Defibrillator pads overlying the left hemithorax. IMPRESSION: Multifocal pneumonia in this patient with known COVID, grossly unchanged. Electronically Signed   By: Julian Hy M.D.   On: 12/04/2019 14:29   ECHOCARDIOGRAM COMPLETE  Result Date: 12/05/2019    ECHOCARDIOGRAM REPORT   Patient Name:   [name] Date of Exam: 12/05/2019 Medical Rec #:  [id number]       Height:       64.0 in Accession #:    [id number]      Weight:  145.5 lb Date of Birth:  05/17/1970        BSA:          1.709 m Patient Age:    56 years        BP:           101/64 mmHg Patient Gender: F               HR:           70 bpm. Exam Location:  [facility] Procedure: 2D Echo Indications:    Abnormal ECG 794.31 / R94.31  History:        Patient has prior history of Echocardiogram examinations, most                 recent 08/20/2011. Pneumonia due to COVID-19 virus, History of                 breast cancer, SVT , Acute respiratory failure with hypoxia ,                 Mitral Valve Replacement.  Sonographer:    Leavy Cella RDCS (AE) Referring Phys:  [id number] VINEET SOOD  Sonographer Comments: Image acquisition challenging due to breast implants and restricted mobility. IMPRESSIONS  1. Difficult to assess wall motion due to poor visualization. Left ventricular ejection fraction, by estimation, is 55 to 60%. The left ventricle has normal function. The left ventricle has no regional wall motion abnormalities. There is mild concentric  left ventricular hypertrophy. diastolic function is indeterminant due to mechanical mitral valve.  2. Right ventricular systolic function is mildly reduced. The right ventricular size is normal.  3. A [employer]. Jude mechanical mitral valve prosthesis is partially visualized. Appears well seated based on limted views. Mean gradient 66mmHg at HR 67. No evidence of mitral valve regurgitation.  4. The aortic valve is tricuspid. Aortic valve regurgitation is trivial.  5. The inferior vena cava is normal in size with greater than 50% respiratory variability, suggesting right atrial pressure of 3 mmHg. Comparison(s): No significant change from prior study. FINDINGS  Left Ventricle: Difficult to assess wall motion due to poor visualization. Left ventricular ejection fraction, by estimation, is 55 to 60%. The left ventricle has normal function. The left ventricle has no regional wall motion abnormalities. The left ventricular internal cavity size was normal in size. There is mild concentric left ventricular hypertrophy. Diastolic function is indeterminant due to mechanical mitral valve. Right Ventricle: The right ventricular size is normal. Right vetricular wall thickness was not well visualized. Right ventricular systolic function is mildly reduced. Left Atrium: Left atrial size was not well visualized. Right Atrium: Right atrial size was normal in size. Pericardium: There is no evidence of pericardial effusion. Mitral Valve: A [employer]. Jude mechanical mitral valve prosthesis is partially visualized. Appears well seated based on limted view. Mean gradient  39mmHg at HR 67. No evidence of mitral valve regurgitation. MV peak gradient, 6.2 mmHg. Tricuspid Valve: The tricuspid valve is normal in structure. Tricuspid valve regurgitation is mild. Aortic Valve: The aortic valve is tricuspid. Aortic valve regurgitation is trivial. Pulmonic Valve: The pulmonic valve was grossly normal. Pulmonic valve regurgitation is trivial. Aorta: The aortic root is normal in size and structure. Venous: The inferior vena cava is normal in size with greater than 50% respiratory variability, suggesting right atrial pressure of 3 mmHg. IAS/Shunts: The atrial septum is grossly normal.  LEFT VENTRICLE PLAX 2D LVIDd:         3.25  cm  Diastology LVIDs:         2.33 cm  LV e' medial:    4.79 cm/s LV PW:         1.21 cm  LV E/e' medial:  16.7 LV IVS:        1.25 cm  LV e' lateral:   6.64 cm/s LVOT diam:     2.00 cm  LV E/e' lateral: 12.1 LVOT Area:     3.14 cm  RIGHT VENTRICLE TAPSE (M-mode): 1.6 cm LEFT ATRIUM           Index       RIGHT ATRIUM          Index LA diam:      3.70 cm 2.17 cm/m  RA Area:     9.38 cm LA Vol (A2C): 32.7 ml 19.13 ml/m RA Volume:   18.40 ml 10.77 ml/m LA Vol (A4C): 59.3 ml 34.70 ml/m   AORTA Ao Root diam: 3.50 cm MITRAL VALVE               TRICUSPID VALVE MV Area (PHT): 3.68 cm    TR Peak grad:   39.9 mmHg MV Peak grad:  6.2 mmHg    TR Vmax:        316.00 cm/s MV Mean grad:  2.0 mmHg MV Vmax:       1.25 m/s    SHUNTS MV Vmean:      68.6 cm/s   Systemic Diam: 2.00 cm MV Decel Time: 206 msec MV E velocity: 80.10 cm/s MV A velocity: 69.40 cm/s MV E/A ratio:  1.15 Gwyndolyn Kaufman MD Electronically signed by Gwyndolyn Kaufman MD Signature Date/Time: 12/05/2019/5:35:34 PM    Final     Scheduled Meds: . Chlorhexidine Gluconate Cloth  6 each Topical Daily  . dextromethorphan-guaiFENesin  1 tablet Oral BID  . feeding supplement  237 mL Oral TID BM  . furosemide  20 mg Oral Daily  . insulin aspart  0-20 Units Subcutaneous TID WC  . levothyroxine  75 mcg Oral QAC  breakfast  . methylPREDNISolone (SOLU-MEDROL) injection  60 mg Intravenous Q6H  . metoCLOPramide  10 mg Oral TID AC  . multivitamin with minerals  1 tablet Oral Daily  . pantoprazole  40 mg Oral BID AC  . polyethylene glycol  17 g Oral Daily  . potassium chloride  10 mEq Oral Daily  . potassium chloride  40 mEq Oral Once  . saccharomyces boulardii  250 mg Oral BID  . sucralfate  1 g Oral TID WC & HS  . warfarin  2.5 mg Oral ONCE-1600  . Warfarin - Pharmacist Dosing Inpatient   Does not apply q1600   Continuous Infusions: . anidulafungin Stopped (12/05/19 1813)  . ceFEPime (MAXIPIME) IV 2 g (12/06/19 0617)  . diltiazem (CARDIZEM) infusion 4 mg/hr (12/06/19 0214)    LOS: 8 days   Critical Care Procedure Note Authorized and Performed by: Murvin Natal MD  Total Critical Care time: 35 mins Due to a high probability of clinically significant, life threatening deterioration, the patient required my highest level of preparedness to intervene emergently and I personally spent this critical care time directly and personally managing the patient.  This critical care time included obtaining a history; examining the patient, pulse oximetry; ordering and review of studies; arranging urgent treatment with development of a management plan; evaluation of patient's response of treatment; frequent reassessment; and discussions with other providers.  This critical care time was performed to assess  and manage the high probability of imminent and life threatening deterioration that could result in multi-organ failure.  It was exclusive of separately billable procedures and treating other patients and teaching time.   Irwin Brakeman, MD How to contact the [facility] Attending or Consulting provider [place] or covering provider during after hours [place], for this patient?  1. Check the care team in [facility] and look for a) attending/consulting [facility] provider listed and b) the [facility] team listed 2. Log into [email or web address] and use  [place]'s universal password to access. If you do not have the password, please contact the hospital operator. 3. Locate the [facility] provider you are looking for under [facility] and page to a number that you can be directly reached. 4. If you still have difficulty reaching the provider, please page the [facility] (Director on Call) for the Hospitalists listed on amion for assistance.  12/06/2019, 11:32 AM

## 2019-12-06 NOTE — Significant Event (Addendum)
Rapid Response Event Note   Reason for Call :  Called d/t increased WOB.  Pt came from [facility] today. Pt was evaluated by PCCM at 1711. SpO2 at that time was 70s on 45L HHFNC 100% and NRB. Decision made not to tx pt to ICU and, per MD note,  that mental status and WOB should guide decision to intubate instead of SpO2 alone.   Pt was seen 1 hour ago(at 2100) during rounds. At that point, she was tachypneic and mildly labored(RR-30s), SpO2-70s on 45L HHFNC 100% and NRB. She was alert, oriented, and able to speak in complete sentences.     Initial Focused Assessment:  Pt laying in bed with eyes closed. She is still alert and oriented, but more tachypneic(RR-40-50s) and more labored. She looks like she is getting tired. Pt says her breathing is worse than when she saw me an hour ago. She is saying she can't get enough oxygen. Lungs with exp wheezes, very diminished t/o. Oxygen turned up the 60L HHFNC 100% and NRB. HR-131, BP-143/109, RR-44, SpO2-70s   Interventions:  PCCM coming to bedside.  Bipap Order to tx pt to ICU.  Plan of Care:  Will tx pt to ICU.   Update: 2240 Pt placed on bipap. Once on bipap, pt went unresponsive and HR began to slow to 90s(from 130s). Pulse was palpable. Dr. Jarrett Soho) at bedside, decision made to given 0.2mg  epi and intubate pt on 2C. Pt given 0.2mg  Epi @ 2242, 20mg  Etomidate/40mg  Roc @ 2247, and intubated at 2248. Immediately after intubation, pt HR began to slow again(40s) for which she was given 1amp atropine @ 2250 with HR increasing to 110. SBP initially dropped to 50s and then increased to 90s. Pt was then transported to 2M01 with MD, 2C RN, RT, and RRT.     Event Summary:   MD Notified: Dr. Delma Officer PCCM MD) @  2214 Call Ruth, Adilson Grafton Anderson, RN

## 2019-12-06 NOTE — Progress Notes (Addendum)
NAME:  [name], MRN:  [id number], DOB:  02/06/1971, LOS: 8 ADMISSION DATE:  12/09/2019, CONSULTATION DATE:  12/04/2019 REFERRING MD:  Dr. Harrington Challenger, Cardiology, CHIEF COMPLAINT:  Short of breath   Brief History   49 yo female with hx of SLE and RA on rituximab as an outpt (last dose beginning of September 2021) was diagnosed with COVID 19 pneumonia on 11/02/19 and admitted to [facility] from 11/09/19 to 11/11/19.  She was treated with remdesivir and decadron.  She returned to ER on 11/24/19 with progressive dyspnea, fever up to 102F, hypoxia.  Found to have worsening pulmonary infiltrates.  Started on ABx for CAP and dexamethasone.  She was d/c home again on 11/26/19 with prednisone taper and supplemental oxygen.  She again returned to the ER on 11/26/2019 with hypoxia and dizziness.  She was started on solumedrol and antibiotics again.  She had increased oxygen requirements.  She developed SVT on 12/04/19 and transferred to ICU.  PCCM consulted.  Past Medical History  RA, SLE, MVR on chronic anticoagulation, Raynaud's, Gastroparesis, Hypothyroidism  Significant Hospital Events   10/08 Readmit to [facility] 10/12 increasing O2 needs, started high flow oxygen 10/15 SVT, transfer to ICU; arrange for transfer to [place]:  Cardiology at [facility]  Procedures:  Rt IJ PICC 10/09 >>   Significant Diagnostic Tests:  CT angio chest 11/28/19 >> b/l diffuse GGO   Micro Data:  COVID 9/20 >> positive Blood 10/09 >> negative Legionella 10/10 >> negative MRSA screen 10/15 >>  Fungitell 10/15 >>   Antimicrobials:  Rocephin 10/08 >> 10/11 Zithromax 10/08 >> 10/12 Baricitinib 10/13 >> 10/15  Augmentin 10/14 >> 10/15 Vancomycin 10/15 >> Cefepime 10/15 >> Eraxis 10/15 >>   Interim history/subjective:  Seen on transfer to [place]. Denies pain. Having ongoing intermittent SVT.  Objective   Blood pressure 105/78, pulse 97, temperature 99 F (37.2 C), temperature source Oral, resp. rate (!) 30, height 5' 4"   (1.626 m), weight 66 kg, SpO2 (!) 77 %.    FiO2 (%):  [100 %] 100 %   Intake/Output Summary (Last 24 hours) at 12/06/2019 1711 Last data filed at 12/06/2019 1300 Gross per 24 hour  Intake 584.32 ml  Output 1250 ml  Net -665.68 ml   Filed Weights   11/30/19 0008  Weight: 66 kg    Examination: Middle age woman in NAD NRB and HFNC in place Lungs occasional rhonci, mild tachypnea No peripheral edema Heart sounds regular for me, ext are warm    Resolved Hospital Problem list     Assessment & Plan:   Acute hypoxic respiratory failure from COVID 19 pneumonia with probable superinfection and/or acute pneumonitis with history of SLE/RA.  At risk for multiple atypical pathogens on top of COVID-induced lung damage, some of which may now be chronic. - Check ESR, CRP, fungitel, pct - Continue broad spectrum antimicrobials and steroids - Lasix/albumin challenge - If intubated would do bronchoscopy - WOB and mental status should guide intubation rather than saturations alone.  Currently speaking in full sentences, breathing stable per her report x 24h,  - Will follow with you  SVT. Hx of HTN, MVR. - On AC, would just use amiodarone given persistence despite dilt drip and borderline pressures - Continue warfarin  Hx of RA, SLE. - hold outpt rituximab  [place]- resume home requip, ambien, maintaining adequate sleep will need to be weighed again risks of respiratory depression  Best practice:  Diet: clear liquids DVT prophylaxis: coumadin GI prophylaxis: protonix  Mobility: bed rest Code Status: full code Family Communication: updated patient at bedside Disposition: okay to stay in [place] for now unless breathing deteriorates, we will follow closely with you  MDM Criticall ill due to acute hypoxemic respiratory failure in setting of COVID, immunosuppression, and possible atypical infection.  Intervention include antimicrobials, oxygen titration, and close montioring.  Risk of  deterioration without interventions is high.   The patient is critically ill with multiple organ systems failure and requires high complexity decision making for assessment and support, frequent evaluation and titration of therapies, application of advanced monitoring technologies and extensive interpretation of multiple databases. Critical Care Time devoted to patient care services described in this note independent of APP/resident time (if applicable)  is 34 minutes.   Erskine Emery MD [facility] 12/06/2019 5:25 PM Personal pager: [phone] If unanswered, please page CCM On-call: [phone]

## 2019-12-06 NOTE — Progress Notes (Addendum)
[facility] Progress Note Patient Name: [name] DOB: 02-09-1971 MRN: [id number]   Date of Service  12/06/2019  HPI/Events of Note  5F with SLE and RA (on rituximab as outpatient, last dose at start of 10/2019) who was diagnosed and treated for COVID pneumonia in mid 10/2019.  Severe mixed respiratory and metabolic acidosis on first blood gas after intubation: 6.91/79.8/85/16.2/BE -17. This was complicated by development of hypotension requiring levophed infusion.     eICU Interventions   # Neuro: - Prop/fentanyl for sedation  # Cardiac: - Levophed for goal MAP 65 - Discontinued amiodarone: not indicated at this time. - Discontinued albumin: not indicated at this time.  # Respiratory: - PCP DFA, sputum culture - Broad spectrum antibiotics. - Steroids. - Bronch in AM. - Adjustments to vent settings as follows: Vent Mode: PRVC FiO2 (%):  [100 %] 100 % Set Rate:  [25 bmp-35 bmp] 35 bmp Vt Set:  [400 mL] 400 mL PEEP:  [10 YSA63-01 cmH20] 10 cmH20 Plateau Pressure:  [25 cmH20-40 cmH20] 40 cmH20  # Renal: - Metabolic acidosis: F/u lactate, beta hydroxy butyrate. S/p 2 amps of NaHCO3.  Remainder of issues per Dr. Renata [name] H&P.     Intervention Category Evaluation Type: New Patient Evaluation  Charlott Rakes 12/06/2019, 11:45 PM

## 2019-12-06 NOTE — Progress Notes (Signed)
Transported pt to 2M01 manually ventilated  from [place] with Rapid response RN and bedside RN. Placed on vent by ICU RT.

## 2019-12-07 ENCOUNTER — Inpatient Hospital Stay (HOSPITAL_COMMUNITY): Payer: BC Managed Care – PPO

## 2019-12-07 DIAGNOSIS — J069 Acute upper respiratory infection, unspecified: Secondary | ICD-10-CM | POA: Diagnosis not present

## 2019-12-07 DIAGNOSIS — D65 Disseminated intravascular coagulation [defibrination syndrome]: Secondary | ICD-10-CM

## 2019-12-07 DIAGNOSIS — I469 Cardiac arrest, cause unspecified: Secondary | ICD-10-CM | POA: Diagnosis not present

## 2019-12-07 DIAGNOSIS — R0489 Hemorrhage from other sites in respiratory passages: Secondary | ICD-10-CM

## 2019-12-07 DIAGNOSIS — U071 COVID-19: Secondary | ICD-10-CM | POA: Diagnosis not present

## 2019-12-07 DIAGNOSIS — J80 Acute respiratory distress syndrome: Secondary | ICD-10-CM

## 2019-12-07 DIAGNOSIS — J9601 Acute respiratory failure with hypoxia: Secondary | ICD-10-CM | POA: Diagnosis not present

## 2019-12-07 LAB — HEMOGLOBIN A1C
Hgb A1c MFr Bld: 5.3 % (ref 4.8–5.6)
Mean Plasma Glucose: 105 mg/dL

## 2019-12-07 LAB — CBC
HCT: 18.5 % — ABNORMAL LOW (ref 36.0–46.0)
Hemoglobin: 5.9 g/dL — CL (ref 12.0–15.0)
MCH: 31.2 pg (ref 26.0–34.0)
MCHC: 31.9 g/dL (ref 30.0–36.0)
MCV: 97.9 fL (ref 80.0–100.0)
Platelets: 51 10*3/uL — ABNORMAL LOW (ref 150–400)
RBC: 1.89 MIL/uL — ABNORMAL LOW (ref 3.87–5.11)
RDW: 14.8 % (ref 11.5–15.5)
WBC: 28.1 10*3/uL — ABNORMAL HIGH (ref 4.0–10.5)
nRBC: 2.2 % — ABNORMAL HIGH (ref 0.0–0.2)

## 2019-12-07 LAB — POCT I-STAT 7, (LYTES, BLD GAS, ICA,H+H)
Acid-Base Excess: 1 mmol/L (ref 0.0–2.0)
Acid-Base Excess: 10 mmol/L — ABNORMAL HIGH (ref 0.0–2.0)
Acid-base deficit: 17 mmol/L — ABNORMAL HIGH (ref 0.0–2.0)
Acid-base deficit: 6 mmol/L — ABNORMAL HIGH (ref 0.0–2.0)
Bicarbonate: 16.2 mmol/L — ABNORMAL LOW (ref 20.0–28.0)
Bicarbonate: 24.1 mmol/L (ref 20.0–28.0)
Bicarbonate: 29.3 mmol/L — ABNORMAL HIGH (ref 20.0–28.0)
Bicarbonate: 37.2 mmol/L — ABNORMAL HIGH (ref 20.0–28.0)
Calcium, Ion: 0.99 mmol/L — ABNORMAL LOW (ref 1.15–1.40)
Calcium, Ion: 0.99 mmol/L — ABNORMAL LOW (ref 1.15–1.40)
Calcium, Ion: 1.03 mmol/L — ABNORMAL LOW (ref 1.15–1.40)
Calcium, Ion: 1.07 mmol/L — ABNORMAL LOW (ref 1.15–1.40)
HCT: 25 % — ABNORMAL LOW (ref 36.0–46.0)
HCT: 30 % — ABNORMAL LOW (ref 36.0–46.0)
HCT: 31 % — ABNORMAL LOW (ref 36.0–46.0)
HCT: 31 % — ABNORMAL LOW (ref 36.0–46.0)
Hemoglobin: 10.2 g/dL — ABNORMAL LOW (ref 12.0–15.0)
Hemoglobin: 10.5 g/dL — ABNORMAL LOW (ref 12.0–15.0)
Hemoglobin: 10.5 g/dL — ABNORMAL LOW (ref 12.0–15.0)
Hemoglobin: 8.5 g/dL — ABNORMAL LOW (ref 12.0–15.0)
O2 Saturation: 75 %
O2 Saturation: 82 %
O2 Saturation: 86 %
O2 Saturation: 88 %
Patient temperature: 37.7
Patient temperature: 96.2
Patient temperature: 97.9
Patient temperature: 97.9
Potassium: 3.5 mmol/L (ref 3.5–5.1)
Potassium: 3.8 mmol/L (ref 3.5–5.1)
Potassium: 4.6 mmol/L (ref 3.5–5.1)
Potassium: 4.9 mmol/L (ref 3.5–5.1)
Sodium: 128 mmol/L — ABNORMAL LOW (ref 135–145)
Sodium: 133 mmol/L — ABNORMAL LOW (ref 135–145)
Sodium: 133 mmol/L — ABNORMAL LOW (ref 135–145)
Sodium: 134 mmol/L — ABNORMAL LOW (ref 135–145)
TCO2: 19 mmol/L — ABNORMAL LOW (ref 22–32)
TCO2: 26 mmol/L (ref 22–32)
TCO2: 31 mmol/L (ref 22–32)
TCO2: 39 mmol/L — ABNORMAL HIGH (ref 22–32)
pCO2 arterial: 64.7 mmHg — ABNORMAL HIGH (ref 32.0–48.0)
pCO2 arterial: 70.4 mmHg (ref 32.0–48.0)
pCO2 arterial: 70.6 mmHg (ref 32.0–48.0)
pCO2 arterial: 79.8 mmHg (ref 32.0–48.0)
pH, Arterial: 6.913 — CL (ref 7.350–7.450)
pH, Arterial: 7.14 — CL (ref 7.350–7.450)
pH, Arterial: 7.23 — ABNORMAL LOW (ref 7.350–7.450)
pH, Arterial: 7.361 (ref 7.350–7.450)
pO2, Arterial: 41 mmHg — ABNORMAL LOW (ref 83.0–108.0)
pO2, Arterial: 60 mmHg — ABNORMAL LOW (ref 83.0–108.0)
pO2, Arterial: 70 mmHg — ABNORMAL LOW (ref 83.0–108.0)
pO2, Arterial: 85 mmHg (ref 83.0–108.0)

## 2019-12-07 LAB — COMPREHENSIVE METABOLIC PANEL
ALT: 456 U/L — ABNORMAL HIGH (ref 0–44)
ALT: 2399 U/L — ABNORMAL HIGH (ref 0–44)
AST: 592 U/L — ABNORMAL HIGH (ref 15–41)
AST: 4117 U/L — ABNORMAL HIGH (ref 15–41)
Albumin: 2 g/dL — ABNORMAL LOW (ref 3.5–5.0)
Albumin: 1.2 g/dL — ABNORMAL LOW (ref 3.5–5.0)
Alkaline Phosphatase: 160 U/L — ABNORMAL HIGH (ref 38–126)
Alkaline Phosphatase: 151 U/L — ABNORMAL HIGH (ref 38–126)
Anion gap: 17 — ABNORMAL HIGH (ref 5–15)
Anion gap: 21 — ABNORMAL HIGH (ref 5–15)
BUN: 25 mg/dL — ABNORMAL HIGH (ref 6–20)
BUN: 31 mg/dL — ABNORMAL HIGH (ref 6–20)
CO2: 25 mmol/L (ref 22–32)
CO2: 35 mmol/L — ABNORMAL HIGH (ref 22–32)
Calcium: 7.3 mg/dL — ABNORMAL LOW (ref 8.9–10.3)
Calcium: 6.5 mg/dL — ABNORMAL LOW (ref 8.9–10.3)
Chloride: 93 mmol/L — ABNORMAL LOW (ref 98–111)
Chloride: 90 mmol/L — ABNORMAL LOW (ref 98–111)
Creatinine, Ser: 1.58 mg/dL — ABNORMAL HIGH (ref 0.44–1.00)
Creatinine, Ser: 2.19 mg/dL — ABNORMAL HIGH (ref 0.44–1.00)
GFR, Estimated: 37 mL/min — ABNORMAL LOW (ref >60)
GFR, Estimated: 25 mL/min — ABNORMAL LOW (ref >60)
Glucose, Bld: 316 mg/dL — ABNORMAL HIGH (ref 70–99)
Glucose, Bld: 219 mg/dL — ABNORMAL HIGH (ref 70–99)
Potassium: 3.9 mmol/L (ref 3.5–5.1)
Potassium: 5.4 mmol/L — ABNORMAL HIGH (ref 3.5–5.1)
Sodium: 135 mmol/L (ref 135–145)
Sodium: 146 mmol/L — ABNORMAL HIGH (ref 135–145)
Total Bilirubin: 2 mg/dL — ABNORMAL HIGH (ref 0.3–1.2)
Total Bilirubin: 1.1 mg/dL (ref 0.3–1.2)
Total Protein: 5.2 g/dL — ABNORMAL LOW (ref 6.5–8.1)
Total Protein: <3 g/dL — ABNORMAL LOW (ref 6.5–8.1)

## 2019-12-07 LAB — BLOOD GAS, ARTERIAL
Acid-Base Excess: 19.9 mmol/L — ABNORMAL HIGH (ref 0.0–2.0)
Bicarbonate: 47.3 mmol/L — ABNORMAL HIGH (ref 20.0–28.0)
Drawn by: 51185
FIO2: 100
O2 Saturation: 84.8 %
Patient temperature: 37
pCO2 arterial: 103 mmHg (ref 32.0–48.0)
pH, Arterial: 7.285 — ABNORMAL LOW (ref 7.350–7.450)
pO2, Arterial: 60.7 mmHg — ABNORMAL LOW (ref 83.0–108.0)

## 2019-12-07 LAB — BASIC METABOLIC PANEL
Anion gap: 21 — ABNORMAL HIGH (ref 5–15)
BUN: 22 mg/dL — ABNORMAL HIGH (ref 6–20)
CO2: 21 mmol/L — ABNORMAL LOW (ref 22–32)
Calcium: 7.1 mg/dL — ABNORMAL LOW (ref 8.9–10.3)
Chloride: 94 mmol/L — ABNORMAL LOW (ref 98–111)
Creatinine, Ser: 1.32 mg/dL — ABNORMAL HIGH (ref 0.44–1.00)
GFR, Estimated: 47 mL/min — ABNORMAL LOW (ref >60)
Glucose, Bld: 348 mg/dL — ABNORMAL HIGH (ref 70–99)
Potassium: 3.7 mmol/L (ref 3.5–5.1)
Sodium: 136 mmol/L (ref 135–145)

## 2019-12-07 LAB — RESPIRATORY PANEL BY PCR

## 2019-12-07 LAB — CBC WITH DIFFERENTIAL/PLATELET
Abs Immature Granulocytes: 2.32 10*3/uL — ABNORMAL HIGH (ref 0.00–0.07)
Basophils Absolute: 0.1 10*3/uL (ref 0.0–0.1)
Basophils Relative: 0 %
Eosinophils Absolute: 0 10*3/uL (ref 0.0–0.5)
Eosinophils Relative: 0 %
HCT: 32.2 % — ABNORMAL LOW (ref 36.0–46.0)
Hemoglobin: 10.2 g/dL — ABNORMAL LOW (ref 12.0–15.0)
Immature Granulocytes: 5 %
Lymphocytes Relative: 1 %
Lymphs Abs: 0.4 10*3/uL — ABNORMAL LOW (ref 0.7–4.0)
MCH: 30.7 pg (ref 26.0–34.0)
MCHC: 31.7 g/dL (ref 30.0–36.0)
MCV: 97 fL (ref 80.0–100.0)
Monocytes Absolute: 0.5 10*3/uL (ref 0.1–1.0)
Monocytes Relative: 1 %
Neutro Abs: 44.7 10*3/uL — ABNORMAL HIGH (ref 1.7–7.7)
Neutrophils Relative %: 93 %
Platelets: 107 10*3/uL — ABNORMAL LOW (ref 150–400)
RBC: 3.32 MIL/uL — ABNORMAL LOW (ref 3.87–5.11)
RDW: 14.8 % (ref 11.5–15.5)
WBC: 47.9 10*3/uL — ABNORMAL HIGH (ref 4.0–10.5)
nRBC: 1 % — ABNORMAL HIGH (ref 0.0–0.2)

## 2019-12-07 LAB — TYPE AND SCREEN
ABO/RH(D): O POS
Antibody Screen: NEGATIVE

## 2019-12-07 LAB — GLUCOSE, CAPILLARY
Glucose-Capillary: 275 mg/dL — ABNORMAL HIGH (ref 70–99)
Glucose-Capillary: 221 mg/dL — ABNORMAL HIGH (ref 70–99)
Glucose-Capillary: 225 mg/dL — ABNORMAL HIGH (ref 70–99)
Glucose-Capillary: 152 mg/dL — ABNORMAL HIGH (ref 70–99)
Glucose-Capillary: 103 mg/dL — ABNORMAL HIGH (ref 70–99)

## 2019-12-07 LAB — LACTIC ACID, PLASMA
Lactic Acid, Venous: >11 mmol/L (ref 0.5–1.9)
Lactic Acid, Venous: 7 mmol/L (ref 0.5–1.9)
Lactic Acid, Venous: 3.2 mmol/L (ref 0.5–1.9)
Lactic Acid, Venous: >11 mmol/L (ref 0.5–1.9)

## 2019-12-07 LAB — DIC (DISSEMINATED INTRAVASCULAR COAGULATION)PANEL
D-Dimer, Quant: >20 ug/mL-FEU — ABNORMAL HIGH (ref 0.00–0.50)
Fibrinogen: 177 mg/dL — ABNORMAL LOW (ref 210–475)
INR: 5.2 (ref 0.8–1.2)
Platelets: 51 10*3/uL — ABNORMAL LOW (ref 150–400)
Prothrombin Time: 46.6 seconds — ABNORMAL HIGH (ref 11.4–15.2)
Smear Review: NONE SEEN
aPTT: 48 seconds — ABNORMAL HIGH (ref 24–36)

## 2019-12-07 LAB — BETA-HYDROXYBUTYRIC ACID: Beta-Hydroxybutyric Acid: 0.25 mmol/L (ref 0.05–0.27)

## 2019-12-07 LAB — MAGNESIUM: Magnesium: 3.2 mg/dL — ABNORMAL HIGH (ref 1.7–2.4)

## 2019-12-07 LAB — PHOSPHORUS: Phosphorus: 7.8 mg/dL — ABNORMAL HIGH (ref 2.5–4.6)

## 2019-12-07 LAB — PROTIME-INR
INR: 3.2 — ABNORMAL HIGH (ref 0.8–1.2)
Prothrombin Time: 32 seconds — ABNORMAL HIGH (ref 11.4–15.2)

## 2019-12-07 LAB — TRIGLYCERIDES: Triglycerides: 121 mg/dL (ref <150)

## 2019-12-07 LAB — PROCALCITONIN: Procalcitonin: 8.32 ng/mL

## 2019-12-07 MED ORDER — NOREPINEPHRINE 4 MG/250ML-% IV SOLN
INTRAVENOUS | Status: AC
  Filled 2019-12-07: qty 250

## 2019-12-07 MED ORDER — LIDOCAINE HCL (PF) 1 % IJ SOLN
INTRAMUSCULAR | Status: AC
  Filled 2019-12-07: qty 5

## 2019-12-07 MED ORDER — FENTANYL CITRATE (PF) 100 MCG/2ML IJ SOLN: 50.0000 ug | Freq: Once | INTRAMUSCULAR | Status: DC

## 2019-12-07 MED ORDER — SODIUM BICARBONATE 8.4 % IV SOLN
100.0000 meq | Freq: Once | INTRAVENOUS | Status: AC
  Administered 2019-12-07: 100 meq via INTRAVENOUS

## 2019-12-07 MED ORDER — FENTANYL BOLUS VIA INFUSION
50.0000 ug | INTRAVENOUS | Status: DC | PRN
  Filled 2019-12-07: qty 50

## 2019-12-07 MED ORDER — EPINEPHRINE HCL 5 MG/250ML IV SOLN IN NS
INTRAVENOUS | Status: AC
  Filled 2019-12-07: qty 250

## 2019-12-07 MED ORDER — POTASSIUM CHLORIDE 20 MEQ/15ML (10%) PO SOLN
40.0000 meq | Freq: Once | ORAL | Status: AC
  Administered 2019-12-07: 40 meq
  Filled 2019-12-07: qty 30

## 2019-12-07 MED ORDER — INSULIN DETEMIR 100 UNIT/ML ~~LOC~~ SOLN
10.0000 [IU] | Freq: Two times a day (BID) | SUBCUTANEOUS | Status: DC
  Administered 2019-12-07: 10 [IU] via SUBCUTANEOUS
  Filled 2019-12-07 (×3): qty 0.1

## 2019-12-07 MED ORDER — NOREPINEPHRINE 4 MG/250ML-% IV SOLN
0.0000 ug/min | INTRAVENOUS | Status: DC
  Administered 2019-12-07: 40 ug/min via INTRAVENOUS
  Administered 2019-12-07: 4 ug/min via INTRAVENOUS
  Administered 2019-12-07: 6 ug/min via INTRAVENOUS
  Filled 2019-12-07 (×2): qty 250

## 2019-12-07 MED ORDER — PROPOFOL 1000 MG/100ML IV EMUL
25.0000 ug/kg/min | INTRAVENOUS | Status: DC
  Filled 2019-12-07: qty 100

## 2019-12-07 MED ORDER — VECURONIUM BROMIDE 10 MG IV SOLR
0.0800 mg/kg | INTRAVENOUS | Status: DC | PRN
  Administered 2019-12-07: 6 mg via INTRAVENOUS
  Filled 2019-12-07 (×2): qty 10

## 2019-12-07 MED ORDER — VITAL 1.5 CAL PO LIQD: 1000.0000 mL | ORAL | Status: DC

## 2019-12-07 MED ORDER — SODIUM BICARBONATE 8.4 % IV SOLN
INTRAVENOUS | Status: AC
  Filled 2019-12-07: qty 100

## 2019-12-07 MED ORDER — SODIUM CHLORIDE 0.9 % IV SOLN: INTRAVENOUS | Status: DC | PRN

## 2019-12-07 MED ORDER — EPINEPHRINE 1 MG/10ML IJ SOSY
PREFILLED_SYRINGE | INTRAMUSCULAR | Status: AC
  Filled 2019-12-07: qty 30

## 2019-12-07 MED ORDER — EPINEPHRINE HCL 5 MG/250ML IV SOLN IN NS: 0.5000 ug/min | INTRAVENOUS | Status: DC

## 2019-12-07 MED ORDER — AMIODARONE HCL IN DEXTROSE 360-4.14 MG/200ML-% IV SOLN
INTRAVENOUS | Status: AC
  Filled 2019-12-07: qty 200

## 2019-12-07 MED ORDER — CHLORHEXIDINE GLUCONATE 0.12% ORAL RINSE (MEDLINE KIT)
15.0000 mL | Freq: Two times a day (BID) | OROMUCOSAL | Status: DC
  Administered 2019-12-07: 15 mL via OROMUCOSAL

## 2019-12-07 MED ORDER — POTASSIUM CHLORIDE 20 MEQ/15ML (10%) PO SOLN
10.0000 meq | Freq: Every day | ORAL | Status: DC
  Administered 2019-12-07: 10 meq via ORAL
  Filled 2019-12-07: qty 15

## 2019-12-07 MED ORDER — ARTIFICIAL TEARS OPHTHALMIC OINT: 1.0000 "application " | TOPICAL_OINTMENT | Freq: Three times a day (TID) | OPHTHALMIC | Status: DC

## 2019-12-07 MED ORDER — SODIUM CHLORIDE 0.9 % IV SOLN: 2.0000 g | Freq: Two times a day (BID) | INTRAVENOUS | Status: DC

## 2019-12-07 MED ORDER — SODIUM BICARBONATE 8.4 % IV SOLN
INTRAVENOUS | Status: AC
  Filled 2019-12-07: qty 200

## 2019-12-07 MED ORDER — SODIUM CHLORIDE 0.9 % IV SOLN: 2.0000 g | INTRAVENOUS | Status: DC

## 2019-12-07 MED ORDER — ORAL CARE MOUTH RINSE
15.0000 mL | OROMUCOSAL | Status: DC
  Administered 2019-12-07 (×2): 15 mL via OROMUCOSAL

## 2019-12-07 MED ORDER — STERILE WATER FOR INJECTION IV SOLN
INTRAVENOUS | Status: DC
  Filled 2019-12-07: qty 850

## 2019-12-07 MED ORDER — INSULIN ASPART 100 UNIT/ML ~~LOC~~ SOLN
3.0000 [IU] | SUBCUTANEOUS | Status: DC
  Administered 2019-12-07: 9 [IU] via SUBCUTANEOUS
  Administered 2019-12-07 (×2): 6 [IU] via SUBCUTANEOUS

## 2019-12-07 MED ORDER — VASOPRESSIN 20 UNITS/100 ML INFUSION FOR SHOCK
0.0000 [IU]/min | INTRAVENOUS | Status: DC
  Administered 2019-12-07: 0.03 [IU]/min via INTRAVENOUS
  Filled 2019-12-07: qty 100

## 2019-12-07 MED ORDER — PANTOPRAZOLE SODIUM 40 MG PO PACK
40.0000 mg | PACK | Freq: Two times a day (BID) | ORAL | Status: DC
  Administered 2019-12-07: 40 mg
  Filled 2019-12-07 (×2): qty 20

## 2019-12-07 MED ORDER — SODIUM BICARBONATE 8.4 % IV SOLN
INTRAVENOUS | Status: AC
  Filled 2019-12-07: qty 50

## 2019-12-07 MED ORDER — INSULIN ASPART 100 UNIT/ML ~~LOC~~ SOLN: 0.0000 [IU] | SUBCUTANEOUS | Status: DC

## 2019-12-07 MED ORDER — SODIUM BICARBONATE 8.4 % IV SOLN
INTRAVENOUS | Status: DC
  Filled 2019-12-07 (×2): qty 850

## 2019-12-07 MED ORDER — FENTANYL 2500MCG IN NS 250ML (10MCG/ML) PREMIX INFUSION: 50.0000 ug/h | INTRAVENOUS | Status: DC

## 2019-12-07 MED ORDER — PROSOURCE TF PO LIQD: 90.0000 mL | Freq: Three times a day (TID) | ORAL | Status: DC

## 2019-12-07 MED FILL — Medication: Qty: 1 | Status: AC

## 2019-12-08 LAB — SAR COV2 SEROLOGY (COVID19)AB(IGG),IA: SARS-CoV-2 Ab, IgG: NONREACTIVE

## 2019-12-08 LAB — FUNGITELL, SERUM: Fungitell Result: <31 pg/mL (ref <80)

## 2019-12-08 MED FILL — Medication: Qty: 1 | Status: AC

## 2019-12-09 LAB — QUANTIFERON-TB GOLD PLUS (RQFGPL)
QuantiFERON Mitogen Value: 0.19 IU/mL
QuantiFERON Nil Value: 0 IU/mL
QuantiFERON TB1 Ag Value: 0 IU/mL
QuantiFERON TB2 Ag Value: 0 IU/mL

## 2019-12-09 LAB — CULTURE, RESPIRATORY W GRAM STAIN
Culture: NORMAL
Culture: NORMAL

## 2019-12-09 LAB — PNEUMOCYSTIS JIROVECI SMEAR BY DFA: Pneumocystis jiroveci Ag: NEGATIVE

## 2019-12-09 LAB — QUANTIFERON-TB GOLD PLUS: QuantiFERON-TB Gold Plus: UNDETERMINED — AB

## 2019-12-10 LAB — FUNGITELL, SERUM: Fungitell Result: <31 pg/mL (ref <80)

## 2019-12-21 NOTE — Progress Notes (Signed)
Pharmacy Antibiotic Note  [name] is a 49 y.o. female admitted on 11/23/2019 with concern for PNA. Pharmacy has been consulted for Cefepime dosing.  The patient is noted to be in AKI with SCr rise to 1.58 (BL 0.48) -will adjust the Cefepime dose accordingly.  Plan: - Adjust Cefepime to 2g IV every 24 hours - Will continue to follow renal function, culture results, LOT, and antibiotic de-escalation plans   Height: 5\' 4"  (162.6 cm) Weight: 74.9 kg (165 lb 2 oz) IBW/kg (Calculated) : 54.7  Temp (24hrs), Avg:97.6 F (36.4 C), Min:96.2 F (35.7 C), Max:99 F (37.2 C)  Recent Labs  Lab 12/03/19 0500 12/03/19 0500 12/04/19 0443 12/04/19 0443 12/04/19 1245 12/05/19 0414 12/06/19 0529 Dec 22, 2019 0023 12/22/2019 0410 2019-12-22 0411 Dec 22, 2019 1008  WBC 14.4*  --  15.5*  --   --  13.1* 21.2*  --  47.9*  --   --   CREATININE 0.45   < > 0.43*   < > 0.42* 0.49 0.48 1.32* 1.58*  --   --   LATICACIDVEN  --   --   --   --   --   --   --  >11.0*  --  7.0* 3.2*   < > = values in this interval not displayed.    Estimated Creatinine Clearance: 41.8 mL/min (A) (by C-G formula based on SCr of 1.58 mg/dL (H)).    Allergies  Allergen Reactions  . Magnesium-Containing Compounds Other (See Comments)    Chest pain to IV mag  . Methotrexate Derivatives Other (See Comments)    Raised liver enzymes   . 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 kph    Antimicrobials this admission: CTX 10/9 >>> 10/12 Azith 10/9 >> 10/12 Baricitinib 10/13 >> 10/15 Augmentin 10/14 >>10/15 Vanc 10/15 >> 10/16 Cefepime 10/15 >> Eraxis 10/15 >>  Dose adjustments this admission: n/a  Microbiology results: 10/9 BCx >> NGf 10/15 MRSA PCR >> neg 10/18 RCx >> 10/18 PCP cx >>  Thank you for allowing pharmacy to be a part of this patient's care.  Alycia Rossetti, PharmD, BCPS Clinical Pharmacist Clinical  phone for 12/22/2019: [phone] 12-22-19 1:26 PM   **Pharmacist phone directory can now be found on [email or web address] ([email or web address]).  Listed under [place].

## 2019-12-21 NOTE — Progress Notes (Signed)
Initial assessment, pt SOB at rest.  Alert and oriented x4, conversive.  Daughter at bedside.  Pt states that she is SOB but ok.  Pt on heated HF at 45l/min and 100% NRB.  Sats without good waveform despite moving probe to multiple locations.  Best oxygen saturation 73%.  Pt hands and feet cold and cyanotic.  Knees noted to have some mottling.  Mindy RT at bedside for recheck.  Lungs noted to have some wheezing throughout.  Neb given by RT.  Will continue to monitor patient.  Daughter at bedside and aware of pt status.  Discussed plan with daughter, daughter and patient agreeable with plan.

## 2019-12-21 NOTE — Progress Notes (Signed)
Right sided neck swelling noted. E-link called and notified to camera into room to confirm finding. E-link RN will make MD aware about the finding.

## 2019-12-21 NOTE — Progress Notes (Signed)
eLink Physician-Brief Progress Note Patient Name: [name] DOB: 1971/02/17 MRN: [id number]   Date of Service  09-Dec-2019  HPI/Events of Note  Hyperglycemia. RN needs QACHS CBG checks changed to Q4H.  Lactic acid was >11 on last check, confirming this as the source of her significant metabolic acidosis.  Fortunately, updated ABG is 7.14/70/60/24 showing significant improvement in metabolic component and some improvement in respiratory component as well.   eICU Interventions  Repeat lactic acid with AM labs. Expect it will be near-normal at that time.  Repeat ABG in AM. Expect ongoing improvement in metabolic acidosis will improve pH so will not change ventilator setting at this time.     Intervention Category Major Interventions: Acid-Base disturbance - evaluation and management Intermediate Interventions: Hyperglycemia - evaluation and treatment;Other:  Charlott Rakes 09-Dec-2019, 2:18 AM

## 2019-12-21 NOTE — Procedures (Addendum)
PATIENT NAME: [name] MEDICAL RECORD NUMBER: [id number] Birthday: [birth year]  Age: 49 y.o. Admit Date: 11/29/2019  Provider: Brand Males  Indication: PEA arrest  Technical Description:   CPR performance duration: 5 min - 14.47 - 14.52  Was defibrillation or cardioversion used ? no  Was external pacer placed ? no  Was patient intubated pre/post CPR ? PRE CPR  Was transvenous pacer placed ? n  Medications Administered Include      Yes/no Amiodarone no  Atropin no  Calcium no  Epinephrine X 2  Lidocaine no  Magnesium no  Norepinephrine Yes pre arrest  Phenylephrine no  Sodium bicarbonate Yes pre aareest but got bic bolus x 2  Vasopression no   Evaluation  Final Status - Was patient successfully resuscitated ? yes  If successfully resuscitated - what is current rhythm ? nsr If successfully resuscitated - what is current hemodynamic status ? Ongoing circulatory shock  Miscellaneous Information Alveolar Hemorrhage suspected. Bronch done post ROSC. Too ill and coagulopathic for induced hypothermia  Stat cxr, dic panel, coags ordered     SIGNATURE    Dr. Brand Males, M.D., F.C.C.P,  Pulmonary and Critical Care Medicine Staff Physician, [place] Director - Interstitial Lung Disease  Program  Pulmonary [place] at [place], [place], [address]  Pager: [phone], If no answer  OR between  19:00-7:00h: page [phone] Telephone (clinical office): [phone] Telephone (research): [phone]  3:15 PM 12/29/2019

## 2019-12-21 NOTE — Progress Notes (Signed)
Nutrition Follow Up  DOCUMENTATION CODES:   Severe malnutrition in context of acute illness/injury  INTERVENTION:   Tube feeding:  -Vital 1.5 @ 40 ml/hr via OG (960 ml) -ProSource TF 90 ml TID  Provides: 1680 kcals (2036 kcal with propofol), 131 grams protein, 733 ml free water.   NUTRITION DIAGNOSIS:   Severe Malnutrition related to acute illness (COVID-19 virus infection) as evidenced by per patient/family report, energy intake < or equal to 50% for > or equal to 5 days, percent weight loss.  Ongoing  GOAL:   Patient will meet greater than or equal to 90% of their needs  Addressed via TF  MONITOR:   PO intake, Supplement acceptance, Weight trends, Labs, I & O's, Skin  REASON FOR ASSESSMENT:   Malnutrition Screening Tool, Consult Assessment of nutrition requirement/status  ASSESSMENT:   49 year old female with history of SLE on rituximab, RA, Raynaud's, mitral valve replacement on chronic anticoagulation and hypothyroidism presented with 1 day onset of worsening shortness of breath after recent admissions from 9/20-9/22 as well as  10/5-10/7 due to acute respiratory failure with hypoxia secondary to progression of Covid pneumonia.  10/8- readmit AP 10/15- transfer [facility] 10/17- respiratory arrest, intubated  Pt discussed during ICU rounds and with RN.   New PTX, chest tube placed this am . Suspicion pt is developing ARDS. May require NMB and proning. On pressors. UOP decreasing, no plan for CRRT at this time. Xray confirmed OG in stomach. Okay to start feeding per CCM.   Admission weight: 66 kg  Current weight: 74.9 kg   Patient is currently intubated on ventilator support MV: 13.3 L/min Temp (24hrs), Avg:97.6 F (36.4 C), Min:96.2 F (35.7 C), Max:99 F (37.2 C)  Propofol: 13.5 ml/hr- provides 356 kcal from lipids daily   UOP: 760 ml x 24 hrs   Drips: levophed, propofol, sodium bicarb Medications: SS novolog, levemir, solumedrol, MV, miralax, 20 mEq KCl  daily,  Labs: Na 133 (L) K 3.2 (H) Cr 1.58-up from yesterday   Diet Order:   Diet Order    None      EDUCATION NEEDS:   Education needs have been addressed  Skin:  Skin Assessment: Reviewed RN Assessment  Last BM:  10/15  Height:   Ht Readings from Last 1 Encounters:  11/30/19 5\' 4"  (1.626 m)    Weight:   Wt Readings from Last 1 Encounters:  12/06/19 74.9 kg    Ideal Body Weight:  54.5 kg   Adjusted Body Weight: 56.4 kg   BMI:  Body mass index is 28.34 kg/m.  Estimated Nutritional Needs:   Kcal:  9381-8299 kcal  Protein:  110-136  Fluid:  >/= 1.6 L/day   Mariana Single RD, LDN Clinical Nutrition Pager listed in [place]

## 2019-12-21 NOTE — Procedures (Signed)
Diagnostic Bronchoscopy Procedure Note   LAQUASHA GROOME  [id number]  29-Sep-1970  Date:12/20/2019  Time:3:15 PM   Provider Performing:Kadrian Partch  Procedure: Diagnostic Bronchoscopy (27741)  Indication(s) - Blood from ET tube. Suspected alveolar hemorrhage . Noted during bagging. Post ROSC bronch was done  Consent emergent   Anesthesia alread on vent   Time Out Verified patient identification, verified procedure, site/side was marked, verified correct patient position, special equipment/implants available, medications/allergies/relevant history reviewed, required imaging and test results available.   Sterile Technique Usual hand hygiene, masks, gowns, and gloves were used   Procedure Description Bronchoscope advanced through endotracheal tube and into airway.  Blood clot seen in trachea. No active bleeding anywhwere. Initially some active blood from Left Main . Then some from RUL. Later only Old blood diffusely without active bleed. Episode seems to have settled / settling  Complications/Tolerance None; patient tolerated the procedure well..   EBL None - jsut from alveolar hemorrage   Specimen(s) Blood for alv hemorhage wrokup - need to discuss with cards about stopping coumadin     SIGNATURE    Dr. Brand Males, M.D., F.C.C.P,  Pulmonary and Critical Care Medicine Staff Physician, [place] Director - Interstitial Lung Disease  Program  Pulmonary [place] at [place], [place], [address]  Pager: [phone], If no answer  OR between  19:00-7:00h: page [phone] Telephone (clinical office): [phone] Telephone (research): [phone]  3:19 PM 12/20/19

## 2019-12-21 NOTE — Procedures (Signed)
Insertion of Chest Tube Procedure Note  [name]  [id number]  [birth year]  Date:11/25/2019  Time:3:14 PM    Provider Performing: Jose Persia   Procedure: Chest Tube Insertion (35361)  Indication(s) Pneumothorax  Consent Risks of the procedure as well as the alternatives and risks of each were explained to the patient and/or caregiver.  Consent for the procedure was obtained and is signed in the bedside chart  Anesthesia Topical only with 1% lidocaine    Time Out Verified patient identification, verified procedure, site/side was marked, verified correct patient position, special equipment/implants available, medications/allergies/relevant history reviewed, required imaging and test results available.   Sterile Technique Maximal sterile technique including full sterile barrier drape, hand hygiene, sterile gown, sterile gloves, mask, hair covering, sterile ultrasound probe cover (if used).   Procedure Description Ultrasound was not used to identify appropriate pleural anatomy for placement and overlying skin marked. Area of placement cleaned and draped in sterile fashion.  A 20 French chest tube was placed into the right pleural space using Kelly dissection. Appropriate return of blood was obtained, however significant air leak noted on [place]. Tube was exchanged for a 32 French  chest tube. The tube was connected to atrium and placed on -20 cm H2O wall suction.   Complications/Tolerance None; patient tolerated the procedure well. Chest X-ray is ordered to verify placement.   EBL 90 cc  Specimen(s) none

## 2019-12-21 NOTE — Death Summary Note (Addendum)
DISCHARGE SUMMARY    Date of admit: 12/04/2019 10:25 PM Date of discharge: 12/20/19  4:57 PM Length of Stay: 9 days  PCP is Sharilyn Sites, MD  CAUSE(S) OF DEATH  DIC with diffuse alveolar hemorrhage ARDS COVID-19  PROBLEM LIST Principal Problem:   Acute respiratory failure with hypoxia ([place]) Active Problems:   History of mitral valve replacement with mechanical valve   Systemic lupus erythematosus (HCC)   Anticoagulation goal of INR 2.5 to 3.5   Gastroparesis   Primary hypothyroidism   Pneumonia due to COVID-19 virus   Hypokalemia   Hypophosphatemia   Transaminitis   Hypoalbuminemia   Acute respiratory disease due to COVID-19 virus   Protein-calorie malnutrition, severe   COVID-19 virus infection   SVT (supraventricular tachycardia) (HCC)   Wide-complex tachycardia (HCC)   Diffuse pulmonary alveolar hemorrhage   Cardiac arrest (HCC)   DIC (disseminated intravascular coagulation) (HCC)   ARDS (adult respiratory distress syndrome) ([place])  SUMMARY [name] was 49 y.o. 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   has a past surgical history that includes Mitral valve replacement (2008);  Endometrial ablation (2008); Tissue expander placement (02/14/2011); Mastopexy (02/14/2011); Esophagogastroduodenoscopy (N/A, 03/25/2013); Tubal ligation; Appendectomy (N/A, 05/23/2016); Laparoscopic cholecystectomy; Mastectomy (Left, 2012); Tissue expander placement (Left, 11/16/2010); Breast biopsy (Left, 2012); Breast implant exchange (Left, 12/2015); Cardiac valve replacement; Cardiac catheterization (2008); Liver biopsy (11/28/2016); Appendectomy; Cervical conization w/bx (N/A, 03/06/2017); and Corneal transplant (Left).   Admitted on 12/09/2019 with worsening shortness of breath found to have acute respiratory failure secondary to the progression of COVID-19 pneumonia.    Patient was initially admitted from 11/09/2019 to 11/11/2019 for COVID-19 pneumonia at which time she was treated with remdesivir and Decadron.  Patient returned to ER on 11/24/19 with progressive dyspnea, fever up to 102F, hypoxia.  Found to have worsening pulmonary infiltrates.  Started on ABx for CAP and dexamethasone.  She was d/c home on 11/26/19 with prednisone taper and supplemental oxygen.  She again returned to the ER on 12/04/2019 with hypoxia and dizziness.  She was started on solumedrol and antibiotics again.  After admission on 10/08, patient was noted to have worsening oxygen requirements with initiation of high flow nasal cannula on 12/01/2019.  Patient developed SVTs on 10/15 and was transferred to ICU with plans to arrange transfer to [facility].  On 10/17, patient had a respiratory arrest on the floor and was emergently intubated and transferred [facility], ICU.  On the morning of 10/18, she was noted to develop significant right neck swelling consistent with subcutaneous emphysema and chest x-ray was obtained that showed small pneumothorax with some development of pneumomediastinum.  Chest tube was placed. At approximately 14:47 on 20-Dec-2019, patient was noted to become bradycardic on monitor without palpable pulses. Code Blue  was called with ACLS protocol initiated. ROSC was achieved after 5 minutes of CPR with administration of Epinephrine and Bicarb. Bronchoscopy  was emergently pursued with evidence of alveolar hemorrhage (per attending, fresh blood seen in  ETT tube and in bronchoscopy post-CPR in bilateral airways without source). Brief repeated PEA arrest x 4 occurred after bronchoscopy despite maximal pressor support. ROSC was achieved with each episode after 3-5 minutes with administration of Epinephrine and Bicarb pushes. On discussion with family and attending, decision was made to change status to DNR given futility of current status. Time of death: 16:57 on 2019/12/10.  DIC panel returned after patient's expiration with evidence consistent with DIC; patient's alveolar hemorrhage is likely secondary to DIC, which may have ultimately been triggered by sepsis 2/2 to pneumonia and the interval development of severe ARDS  SIGNED Dr. Jose Persia Internal Medicine PGY-2  Pager: [phone]  12/08/2019, 5:06 PM

## 2019-12-21 NOTE — Progress Notes (Signed)
ATTESTATION & SIGNATURE   STAFF NOTE: I, Dr Ann Lions have personally reviewed patient's available data, including medical history, events of note, physical examination and test results as part of my evaluation. I have discussed with resident/NP and other care providers such as pharmacist, RN and RRT.  In addition,  I personally evaluated patient and elicited key findings of   S:COVID on 11/09/19.  Date of admit 12/16/2019 with LOS 9 for today 12/20/19 : [name] is  - intubated now. Fio2 100% and peep 10. Has subcutaneous emphysema over neck. On levophed, diprivan gtt. RN starting fent gtt. Family member at bedside, RN reports high sugars  Is on bic gtt  SVT has resolved  Is on coumadin for mechanical valve  Results for [name], [name] (MRN [id number]) as of 12-20-2019 09:47  Ref. 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), History of abnormal cervical Pap smear (08/12/2015), History of blood transfusion, HSIL (high grade squamous intraepithelial lesion) on Pap smear of cervix (01/14/2017), Hypothyroidism, Mitral valve disease, rheumatic, Peripheral neuropathy, Pneumonia, Raynaud phenomenon, Rheumatoid arthritis ([place]), and SLE (systemic lupus erythematosus) ([place]).   has a past surgical history that includes Mitral valve replacement (2008); Endometrial ablation (2008); Tissue expander placement (02/14/2011); Mastopexy (02/14/2011); Esophagogastroduodenoscopy (N/A, 03/25/2013); Tubal ligation; Appendectomy  (N/A, 05/23/2016); Laparoscopic cholecystectomy; Mastectomy (Left, 2012); Tissue expander placement (Left, 11/16/2010); Breast biopsy (Left, 2012); Breast implant exchange (Left, 12/2015); Cardiac valve replacement; Cardiac catheterization (2008); Liver biopsy (11/28/2016); Appendectomy; Cervical conization w/bx (N/A, 03/06/2017); and Corneal transplant (Left).   O:  Blood pressure (!) 109/58, pulse 96, temperature (!) 96.7 F (35.9 C), temperature source Axillary, resp. rate (!) 36, height 5' 4"  (1.626 m), weight 74.9 kg, SpO2 97 %.   RASS -4 on diprivan gtt, fent gtt Sync with vent, high fio2/peep setting Abd soft Obvious subcutaneous emphysema +   LABS    PULMONARY Recent Labs  Lab 12/06/19 1500 2019/12/20 0008 20-Dec-2019 0150 12/20/2019 0543  PHART 7.421 6.913* 7.140* 7.230*  PCO2ART 45.3 79.8* 70.4* 70.6*  PO2ART 41.3* 85 60* 70*  HCO3 29.0* 16.2* 24.1 29.3*  TCO2  --  19* 26 31  O2SAT 72.3 86.0 82.0 88.0    CBC Recent Labs  Lab 12/05/19 0414 12/05/19 0414 12/06/19 0529 12-20-2019 0008 12-20-2019 0150 2019/12/20 0410 20-Dec-2019 0543  HGB 9.1*   < > 9.1*   < > 10.5* 10.2* 10.2*  HCT 27.9*   < > 29.0*   < > 31.0* 32.2* 30.0*  WBC 13.1*  --  21.2*  --   --  47.9*  --   PLT 172  --  153  --   --  107*  --    < > = values in this interval not displayed.    COAGULATION Recent Labs  Lab 12/03/19 0500 12/04/19 0443 12/05/19 0545 12/06/19 0529 12-20-19 0410  INR 3.0* 3.7* 2.4* 2.2* 3.2*    CARDIAC  No results for input(s): TROPONINI in the last 168 hours. No results for input(s): PROBNP in the last 168 hours.   CHEMISTRY Recent Labs  Lab  12/01/19 0546 12/01/19 0546 12/02/19 0625 12/02/19 0625 12/03/19 0500 12/03/19 0500 12/04/19 0443 12/04/19 0443 12/04/19 1245 12/04/19 1245 12/05/19 0414 12/05/19 0414 12/06/19 0529 12/06/19 0529 Dec 13, 2019 0008 Dec 13, 2019 0008 2019-12-13 0023 December 13, 2019 0023 12-13-2019 0150 Dec 13, 2019 0150 12-13-19 0410 13-Dec-2019 0543  NA 135    < > 135   < > 135   < > 133*   < > 130*   < > 133*   < > 132*   < > 128*  --  136  --  133*  --  135 133*  K 3.5   < > 4.1   < > 3.7   < > 3.7   < > 3.8   < > 3.9   < > 4.1   < > 4.6   < > 3.7   < > 3.8   < > 3.9 3.5  CL 97*   < > 100   < > 98   < > 95*   < > 96*  --  96*  --  96*  --   --   --  94*  --   --   --  93*  --   CO2 28   < > 29   < > 27   < > 30   < > 26  --  30  --  28  --   --   --  21*  --   --   --  25  --   GLUCOSE 162*   < > 111*   < > 154*   < > 127*   < > 120*  --  117*  --  137*  --   --   --  348*  --   --   --  316*  --   BUN 12   < > 13   < > 18   < > 17   < > 17  --  19  --  17  --   --   --  22*  --   --   --  25*  --   CREATININE 0.42*   < > 0.39*   < > 0.45   < > 0.43*   < > 0.42*  --  0.49  --  0.48  --   --   --  1.32*  --   --   --  1.58*  --   CALCIUM 7.4*   < > 8.1*   < > 8.1*   < > 7.9*   < > 7.8*  --  8.2*  --  8.1*  --   --   --  7.1*  --   --   --  7.3*  --   MG 2.0   < > 2.1   < > 2.3   < > 2.0  --  1.8  --  2.2  --  2.1  --   --   --   --   --   --   --  3.2*  --   PHOS 3.3  --  3.0  --  3.0  --   --   --   --   --  3.7  --  2.9  --   --   --   --   --   --   --   --   --    < > = values in this interval not displayed.  Estimated Creatinine Clearance: 41.8 mL/min (A) (by C-G formula based on SCr of 1.58 mg/dL (H)).   LIVER Recent Labs  Lab 12/03/19 0500 12/04/19 0443 12/05/19 0414 12/05/19 0545 12/06/19 0529 12-26-19 0410  AST 32 43* 26  --  51* 592*  ALT 44 75* 58*  --  87* 456*  ALKPHOS 81 86 85  --  104 160*  BILITOT 0.5 0.5 0.5  --  0.4 2.0*  PROT 5.4* 4.9* 5.2*  --  5.1* 5.2*  ALBUMIN 2.2* 2.0* 2.1*  --  2.2* 2.0*  INR 3.0* 3.7*  --  2.4* 2.2* 3.2*     INFECTIOUS Recent Labs  Lab 12/06/19 1851 2019-12-26 0023 2019/12/26 0411  LATICACIDVEN  --  >11.0* 7.0*  PROCALCITON <0.10  --   --      ENDOCRINE CBG (last 3)  Recent Labs    Dec 26, 2019 0151 Dec 26, 2019 0342 Dec 26, 2019 0704  GLUCAP 275* 221* 225*         IMAGING x24h  -  image(s) personally visualized  -   highlighted in bold DG CHEST PORT 1 VIEW  Result Date: December 26, 2019 CLINICAL DATA:  Subcutaneous emphysema EXAM: PORTABLE CHEST 1 VIEW COMPARISON:  12/06/2019 FINDINGS: Shallow inspiration. Endotracheal tube and left central venous catheter are unchanged in position. Increasing subcutaneous emphysema in the neck and right chest wall with suggestion of pneumomediastinum. No obvious pneumothorax. Bilateral perihilar and basilar infiltrates. No pleural effusions. IMPRESSION: Increasing subcutaneous emphysema in the neck and right chest wall with suggestion of developing pneumomediastinum. No obvious pneumothorax. Changes are nonspecific but likely indicate barotrauma. Bilateral perihilar and basilar infiltrates. Electronically Signed   By: Lucienne Capers M.D.   On: 12/26/19 06:36   DG CHEST PORT 1 VIEW  Result Date: 12/06/2019 CLINICAL DATA:  Intubation EXAM: PORTABLE CHEST 1 VIEW COMPARISON:  12/06/2019 FINDINGS: Endotracheal to is 3 cm above the carina. Left central line is in place with the tip in the SVC, unchanged. Diffuse bilateral airspace disease again noted, unchanged. Heart is normal size. IMPRESSION: Endotracheal tube 3 cm above the carina. Diffuse bilateral airspace disease, unchanged. Electronically Signed   By: Rolm Baptise M.D.   On: 12/06/2019 23:36   DG Chest Port 1 View  Result Date: 12/06/2019 CLINICAL DATA:  Acute respiratory distress EXAM: PORTABLE CHEST 1 VIEW COMPARISON:  12/05/2019 FINDINGS: Left IJ central venous catheter tip projects over the lower SVC. Faint wires suggesting epicardial pacer leads project over the right heart. Mildly worsened bilateral interstitial and airspace edema, most confluent in the left mid lung and left lung base (taking into account the left unilateral breast implant which does cause increased density over the left chest). Heart size within normal limits. IMPRESSION: 1. Mildly worsened bilateral interstitial and  airspace edema, most confluent in the left mid lung and left lung base. Electronically Signed   By: Van Clines M.D.   On: 12/06/2019 15:28   DG Abd Portable 1V  Result Date: 12-26-2019 CLINICAL DATA:  Orogastric tube placement EXAM: PORTABLE ABDOMEN - 1 VIEW COMPARISON:  Abdominal CT 11/28/2019 FINDINGS: The orogastric tube tip and side-port overlaps the stomach. Normal bowel gas pattern. Cholecystectomy clips. Artifact from support hardware. IMPRESSION: Orogastric tube in good position with tip and side-port over the stomach. Electronically Signed   By: Monte Fantasia M.D.   On: December 26, 2019 07:38      A: Acute reespiratory failure - ARDS physiology. > 3 weeks post acute covid - bimodal ARDS pattern of onset Development of sucbut emphysema with pneumomediastinum  Circulatory shock Severe ARDS with metab/resp acidosis on bic Transaminitis   SVT resolved - on amio now Coumadin for mechanical heart valve  P:  Full vent support Check CXR/ABG -> decide on chest tube +/- nimbex/prone Continue steroids Check ABG/Lactate Rule out MTb via Quant gold, RVP and HCAP  Pressors for MAP > 65 Cards following  Family at bedside updated  Anti-infectives (From admission, onward)   Start     Dose/Rate Route Frequency Ordered Stop   01-01-2020 2200  ceFEPIme (MAXIPIME) 2 g in sodium chloride 0.9 % 100 mL IVPB        2 g 200 mL/hr over 30 Minutes Intravenous Every 24 hours 01/01/2020 0725     January 01, 2020 1000  ceFEPIme (MAXIPIME) 2 g in sodium chloride 0.9 % 100 mL IVPB  Status:  Discontinued        2 g 200 mL/hr over 30 Minutes Intravenous Every 12 hours 01/01/20 0720 Jan 01, 2020 0725   12/05/19 1500  anidulafungin (ERAXIS) 100 mg in sodium chloride 0.9 % 100 mL IVPB       "Followed by" Linked Group Details   100 mg 78 mL/hr over 100 Minutes Intravenous Every 24 hours 12/04/19 1405     12/05/19 1500  vancomycin (VANCOREADY) IVPB 1250 mg/250 mL  Status:  Discontinued        1,250 mg 166.7 mL/hr  over 90 Minutes Intravenous Every 24 hours 12/04/19 1502 12/06/19 1012   12/04/19 1500  anidulafungin (ERAXIS) 200 mg in sodium chloride 0.9 % 200 mL IVPB       "Followed by" Linked Group Details   200 mg 78 mL/hr over 200 Minutes Intravenous  Once 12/04/19 1405 12/04/19 1840   12/04/19 1500  ceFEPIme (MAXIPIME) 2 g in sodium chloride 0.9 % 100 mL IVPB  Status:  Discontinued        2 g 200 mL/hr over 30 Minutes Intravenous Every 8 hours 12/04/19 1411 Jan 01, 2020 0720   12/04/19 1500  vancomycin (VANCOREADY) IVPB 1500 mg/300 mL        1,500 mg 150 mL/hr over 120 Minutes Intravenous  Once 12/04/19 1413 12/04/19 1646   12/03/19 1430  amoxicillin-clavulanate (AUGMENTIN) 875-125 MG per tablet 1 tablet  Status:  Discontinued        1 tablet Oral Every 12 hours 12/03/19 1339 12/04/19 1409   12/01/19 0400  cefTRIAXone (ROCEPHIN) 2 g in sodium chloride 0.9 % 100 mL IVPB        2 g 200 mL/hr over 30 Minutes Intravenous Every 24 hours 11/30/19 1518 12/01/19 0700   11/29/19 0400  cefTRIAXone (ROCEPHIN) 2 g in sodium chloride 0.9 % 100 mL IVPB  Status:  Discontinued        2 g 200 mL/hr over 30 Minutes Intravenous Every 24 hours 11/28/19 1427 11/30/19 1518   11/28/19 1500  azithromycin (ZITHROMAX) 500 mg in sodium chloride 0.9 % 250 mL IVPB        500 mg 250 mL/hr over 60 Minutes Intravenous Every 24 hours 11/28/19 1427 12/01/19 1624   11/28/19 0500  azithromycin (ZITHROMAX) tablet 500 mg        500 mg Oral  Once 11/28/19 0451 11/28/19 0515   11/28/19 0300  cefTRIAXone (ROCEPHIN) 2 g in sodium chloride 0.9 % 100 mL IVPB        2 g 200 mL/hr over 30 Minutes Intravenous  Once 11/28/19 0249 11/28/19 0501   11/28/19 0300  azithromycin (ZITHROMAX) 500 mg in sodium chloride 0.9 % 250 mL  IVPB  Status:  Discontinued        500 mg 250 mL/hr over 60 Minutes Intravenous Every 24 hours 11/28/19 0249 11/28/19 0451       Rest per NP/medical resident whose note is outlined above and that I agree with  The  patient is critically ill with multiple organ systems failure and requires high complexity decision making for assessment and support, frequent evaluation and titration of therapies, application of advanced monitoring technologies and extensive interpretation of multiple databases.   Critical Care Time devoted to patient care services described in this note is  45  Minutes. This time reflects time of care of this signee Dr Brand Males. This critical care time does not reflect procedure time, or teaching time or supervisory time of PA/NP/Med student/Med Resident etc but could involve care discussion time     Dr. Brand Males, M.D., [facility].C.P Pulmonary and Critical Care Medicine Staff Physician [place] [facility] Pager: [phone], If no answer or between  15:00h - 7:00h: call [phone]  12-20-19 9:49 AM

## 2019-12-21 NOTE — Progress Notes (Signed)
Small Rt Ptx Worsening pneumomediastinum  Plan -  - R Chest tube will be place    SIGNATURE    Dr. Brand Males, M.D., F.C.C.P,  Pulmonary and Critical Care Medicine Staff Physician, [place] Director - Interstitial Lung Disease  Program  Pulmonary [place] at [place], [place], [address]  Pager: [phone], If no answer  OR between  19:00-7:00h: page [phone] Telephone (clinical office): [phone] Telephone (research): [phone]  11:13 AM 2019-12-14

## 2019-12-21 NOTE — Procedures (Signed)
PATIENT NAME: [name] MEDICAL RECORD NUMBER: [id number] Birthday: December 08, 1970  Age: 49 y.o. Admit Date: 12/10/2019  Provider: Ann [name]  Indication: PEA   Technical Description:   CPR performance duration: 3 min - 15.21- 15.24  Was defibrillation or cardioversion used ? no  Was external pacer placed ? no  Was patient intubated pre/post CPR ? pre  Was transvenous pacer placed ? no  Medications Administered Include      Yes/no Amiodarone   Atropin   Calcium   Epinephrine X 1  Lidocaine   Magnesium   Norepinephrine   Phenylephrine   Sodium bicarbonate   Vasopression    Evaluation  Final Status - Was patient successfully resuscitated ?    If successfully resuscitated - what is current rhythm ? *NSR If successfully resuscitated - what is current hemodynamic status ? Circulator shock  Miscellaneous Information Await labs from pripor cpr woprkup Bedside echo by [name] [name] talking to ssiter - who was already updated after 1st cpr by [name] (daughter was after 1st cpr by resident)    [name] 11-01-213:28 PM

## 2019-12-21 NOTE — Plan of Care (Signed)
  Problem: Education: Goal: Knowledge of General Education information will improve Description Including pain rating scale, medication(s)/side effects and non-pharmacologic comfort measures Outcome: Progressing   Problem: Health Behavior/Discharge Planning: Goal: Ability to manage health-related needs will improve Outcome: Progressing   

## 2019-12-21 NOTE — Procedures (Signed)
Arterial Catheter Insertion Procedure Note  [name]  [id number]  September 19, 1970  Date:12/09/19  Time:1:10 AM    Provider Performing: Jacalyn Lefevre    Procedure: Insertion of Arterial Line [phone]) with US guidance (06015)   Indication(s) Blood pressure monitoring and/or need for frequent ABGs  Consent Unable to obtain consent due to emergent nature of procedure.  Anesthesia None   Time Out Verified patient identification, verified procedure, site/side was marked, verified correct patient position, special equipment/implants available, medications/allergies/relevant history reviewed, required imaging and test results available.   Sterile Technique Maximal sterile technique including full sterile barrier drape, hand hygiene, sterile gown, sterile gloves, mask, hair covering, sterile ultrasound probe cover (if used).   Procedure Description Area of catheter insertion was cleaned with chlorhexidine and draped in sterile fashion. With real-time ultrasound guidance an arterial catheter was placed into the left femoral artery.  Appropriate arterial tracings confirmed on monitor.     Complications/Tolerance None; patient tolerated the procedure well.   EBL Minimal   Specimen(s) None

## 2019-12-21 NOTE — H&P (Signed)
NAME:  [name], MRN:  [id number], DOB:  29-May-1970, LOS: 9 ADMISSION DATE:  12/17/2019, CONSULTATION DATE:  12/04/2019 REFERRING MD:  Dr. Harrington Challenger, Cardiology, CHIEF COMPLAINT:  Short of breath   Brief History   49 yo female with hx of SLE and RA on rituximab as an outpt (last dose beginning of September 2021) was diagnosed with COVID 19 pneumonia on 11/02/19 and admitted to [facility] from 11/09/19 to 11/11/19.  She was treated with remdesivir and decadron.  She returned to ER on 11/24/19 with progressive dyspnea, fever up to 102F, hypoxia.  Found to have worsening pulmonary infiltrates.  Started on ABx for CAP and dexamethasone.  She was d/c home again on 11/26/19 with prednisone taper and supplemental oxygen.  She again returned to the ER on 12/14/2019 with hypoxia and dizziness.  She was started on solumedrol and antibiotics again.  She had increased oxygen requirements.  She developed SVT on 12/04/19 and transferred to ICU.  PCCM consulted.  Patient was having worsening respiratory distress and hypoxia on 2C, night of 10/17.  Intubated and transferred to ICU, PCCM primary.    Past Medical History  RA, SLE, MVR on chronic anticoagulation, Raynaud's, Gastroparesis, Hypothyroidism  Significant Hospital Events   10/08 Readmit to [facility] 10/12 increasing O2 needs, started high flow oxygen 10/15 SVT, transfer to ICU; arrange for transfer to [facility] 10/17 Respiratory arrest on the floor, intubated and transferred to ICU, PCCM service  Consults:  Cardiology at [facility]  Procedures:  Rt IJ PICC 10/09 >>  Intubation 10/17  Significant Diagnostic Tests:  CT angio chest 11/28/19 >> b/l diffuse GGO   Micro Data:  COVID 9/20 >> positive Blood 10/09 >> negative Legionella 10/10 >> negative MRSA screen 10/15 >>  Fungitell 10/15 >>   Antimicrobials:  Rocephin 10/08 >> 10/11 Zithromax 10/08 >> 10/12 Baricitinib 10/13 >> 10/15  Augmentin 10/14 >> 10/15 Vancomycin 10/15 >> Cefepime 10/15 >> Eraxis 10/15  >>   Interim history/subjective:  Today patient was having declining respiratory status, increased work of breathing, and started to become more lethargic despite non-rebreather this evening.  Unable to get accurate pulse ox readings on the patient today. Rapid response was called and I came to the bedside.  Transitioned patient to bipap, however upon transitioning, patient went unresponsive and HR started decreasing from 110s to 70s.  Decision made to intubate.  When preparing for intubation, patient's HR decreased to the 60s, given 0.21m of epinephrine with improvement.  Intubated patient with etomidate and rocuronium- smooth.  Shortly after intubation, patient's HR dipped to the 50s- given 141mof atropine with improvement, HR back up to 118.  Transferred to ICU  On arrival to ICU, patient was started on propfol and fentanyl for sedation.  CXR showed adequate tube placement and bilateral opacities, most significant at the bases.  No pneumothorax.   Had some desaturation, which improved when PEEP was increased to 12, and RR was increased from 15 to 25.  Plateau pressures high, ~40.    Objective   Blood pressure 104/74, pulse (!) 114, temperature 97.9 F (36.6 C), temperature source Oral, resp. rate 15, height 5' 4"  (1.626 m), weight 74.9 kg, SpO2 (!) 72 %.    Vent Mode: PRVC FiO2 (%):  [100 %] 100 % Set Rate:  [25 bmp] 25 bmp Vt Set:  [400 mL] 400 mL PEEP:  [12 cmH20] 12 cmH20 Plateau Pressure:  [25 cmH20] 25 cmH20   Intake/Output Summary (Last 24 hours) at [id number] Last data  filed at 12/06/2019 1700 Gross per 24 hour  Intake 100 ml  Output 700 ml  Net -600 ml   Filed Weights   11/30/19 0008 12/06/19 2310  Weight: 66 kg 74.9 kg    Examination: Middle age woman in NAD NRB and HFNC in place Lungs occasional rhonci, mild tachypnea No peripheral edema Heart sounds regular for me, ext are warm    Resolved Hospital Problem list     Assessment & Plan:   Acute hypoxic  respiratory failure from COVID 19 pneumonia with probable superinfection and/or acute pneumonitis with history of SLE/RA.  At risk for multiple atypical pathogens on top of COVID-induced lung damage.  S/p respiratory arrest on the floor, intubated for hypoxia.   - TV 6cc/kg IBW, ARDS protocol - albuterol nebs prn - sedation with fentanyl and propofol, RASS -4 - ESR, CRP, fungitel, pct - Continue broad spectrum antimicrobials and steroids - holding Lasix/albumin challenge - plan for bronchoscopy in the AM  Acidosis: Mixed metabolic and respiratory.   - getting lactate. BMP  - continue broad spec abx  SVT. Hx of HTN, MVR. - On AC, using amio, holding diltiazem.  - Continue warfarin  Hx of RA, SLE. - hold outpt rituximab  [place]- holding requip and [employer] practice:  Diet: clear liquids DVT prophylaxis: coumadin GI prophylaxis: protonix Mobility: bed rest Code Status: full code Family Communication: updated patient at bedside Disposition: okay to stay in [place] for now unless breathing deteriorates, we will follow closely with you  MDM Criticall ill due to acute hypoxemic respiratory failure in setting of COVID, immunosuppression, and possible atypical infection.  Intervention include antimicrobials, oxygen titration, and close montioring.  Risk of deterioration without interventions is high.   The patient is critically ill with multiple organ systems failure and requires high complexity decision making for assessment and support, frequent evaluation and titration of therapies, application of advanced monitoring technologies and extensive interpretation of multiple databases. Critical Care Time devoted to patient care services described in this note independent of APP/resident time (if applicable)  is 34 minutes.   Erskine Emery MD [place] [facility] Critical [facility] 12/18/2019 12:01 AM Personal pager: [phone] If unanswered, please page CCM On-call: [phone]

## 2019-12-21 NOTE — Significant Event (Addendum)
At approximately 14:47, patient was noted to become bradycardic on monitor without palpable pulses. Code Blue was called with ACLS protocol initiated. ROSC was achieved after 5 minutes of CPR with administration of Epinephrine and Bicarb. Bronchoscopy was emergently pursued with evidence of alveolar hemorrhage (per attending, fresh blood seen in  ETT tube and in bronchoscopy post-CPR in bilateral airways without source). Brief repeated PEA arrest x 4 occurred after bronchoscopy despite maximal pressor support. ROSC was achieved with each episode after 3-5 minutes with administration of Epinephrine and Bicarb pushes. Brief bedside echo did not show evidence of cardiogenic shock. ABG returned with pH of 7.285, pCO2 103, pO2 of 60 demonstrating worsening acute hypoxemia and hypercapnia.   Patient's daughter, son-in-law and sister were able to come to bedside. On discussion with family and attending, decision was made to change status to DNR given futility of current status. Time of death: 16:57 on 12/09/2019.   On further evaluation, DIC panel returned with evidence of new thrombocytopenia (plt 51), D-dimer elevation to > 20, low fibrinogen of 177, prolonged PT (46.6) and PTT (48), elevated INR (5.2). With these results, patient's alveolar hemorrhage is likely secondary to DIC, which may have ultimately been triggered by sepsis 2/2 to pneumonia and the interval development of severe ARDS. Trachel aspirate culture is still pending, but gram stain shows evidence of rate GPC, raising concern for HCAP. Partial CMP results have returned as well showing worsening AKI with hyperkalemia (5.4), elevated bicarb that is likely compensatory. AST and ALT still pending. Lactic acid returned at > 11.

## 2019-12-21 NOTE — Progress Notes (Signed)
NAME:  [name], MRN:  [id number], DOB:  [birth year], LOS: 9 ADMISSION DATE:  11/29/2019, CONSULTATION DATE:  12/04/2019 REFERRING MD:  Dr. Harrington Challenger, Cardiology, CHIEF COMPLAINT:  Short of breath   Brief History   49 yo female with hx of SLE and RA on rituximab as an outpt (last dose beginning of September 2021) was diagnosed with COVID 19 pneumonia on 11/02/19 and admitted to [facility] from 11/09/19 to 11/11/19.  She was treated with remdesivir and decadron.  She returned to ER on 11/24/19 with progressive dyspnea, fever up to 102F, hypoxia.  Found to have worsening pulmonary infiltrates.  Started on ABx for CAP and dexamethasone.  She was d/c home again on 11/26/19 with prednisone taper and supplemental oxygen.  She again returned to the ER on 11/25/2019 with hypoxia and dizziness.  She was started on solumedrol and antibiotics again.  She had increased oxygen requirements.  She developed SVT on 12/04/19 and transferred to ICU.  PCCM consulted.  Patient was having worsening respiratory distress and hypoxia on 2C, night of 10/17.  Intubated and transferred to ICU, PCCM primary.    Past Medical History  RA, SLE, MVR on chronic anticoagulation, Raynaud's, Gastroparesis, Hypothyroidism  Significant Hospital Events   10/08 Readmit to [facility] 10/12 increasing O2 needs, started high flow oxygen 10/15 SVT, transfer to ICU; arrange for transfer to [facility] 10/17 Respiratory arrest on the floor, intubated and transferred to ICU, PCCM service  Consults:  Cardiology   Procedures:  Rt IJ PICC 10/09  Intubation 10/17 Chest tube placement 10/18  Significant Diagnostic Tests:   CT angio chest 11/28/19 >> b/l diffuse GGO   Micro Data:  COVID 9/20 >> positive Blood 10/09 >> negative Legionella 10/10 >> negative MRSA screen 10/15 >>  Fungitell 10/15 >>   Antimicrobials:  Rocephin 10/08 >> 10/11 Zithromax 10/08 >> 10/12 Baricitinib 10/13 >> 10/15  Augmentin 10/14 >> 10/15 Vancomycin 10/15 >>  10/17 Cefepime 10/15 >> Eraxis 10/15 >>   Interim history/subjective:   Yesterday afternoon, patient experienced rapid decompensation with eventual respiratory arrest requiring emergent intubation. Complicated by bradycardia requiring Atropine.  Overnight, patient was noted to have developed subcutaneous emphysema surrounding her right chest and neck. CXR obtained that showed some development of pneumomediastinum as well, however no obvious pneumothorax.   Objective   Blood pressure (!) 109/58, pulse 96, temperature (!) 96.7 F (35.9 C), temperature source Axillary, resp. rate (!) 36, height 5\' 4"  (1.626 m), weight 74.9 kg, SpO2 97 %.    Vent Mode: PRVC FiO2 (%):  [100 %] 100 % Set Rate:  [25 bmp-35 bmp] 35 bmp Vt Set:  [380 mL-400 mL] 380 mL PEEP:  [10 EBR83-09 cmH20] 10 cmH20 Plateau Pressure:  [25 cmH20-40 cmH20] 31 cmH20   Intake/Output Summary (Last 24 hours) at 12/23/2019 0957 Last data filed at 2019/12/23 0900 Gross per 24 hour  Intake 1679.52 ml  Output 785 ml  Net 894.52 ml   Filed Weights   11/30/19 0008 12/06/19 2310  Weight: 66 kg 74.9 kg   Physical Exam Vitals and nursing note reviewed.  Constitutional:      Interventions: She is intubated.     Comments: Sedated, ill-appearing female  HENT:     Head: Normocephalic and atraumatic.  Neck:     Comments: Significant subcutaneous emphysema tracking up tot he jaw line on the left with  Cardiovascular:     Rate and Rhythm: Normal rate and regular rhythm.  Pulmonary:     Effort: She is  intubated.     Breath sounds: No wheezing, rhonchi or rales.  Chest:     Chest wall: Crepitus (left upper chest) present.  Musculoskeletal:     Right lower leg: No edema.     Left lower leg: No edema.  Skin:    General: Skin is warm and dry.  Neurological:     Comments: [facility] Problem list   N/A  Assessment & Plan:   # Acute hypoxic and hypercapnic respiratory failure  # Recent COVID 19 pneumonia   At risk for multiple atypical pathogens on top of COVID-induced lung damage.  S/p respiratory arrest on the floor, intubated for hypoxia. Suspicion for developing ARDS. ABG this morning shows continued profound hypoxia with pO2 of 41 despite 100% FiO2 consistent with severe ARDS.   - TV 6cc/kg IBW, ARDS protocol - Levalbuterol nebs q6h prn - Sedation with fentanyl and propofol, RASS -4 - Pending Labs: Tracheal aspirate cultures, respiratory viral panel, pneumocystis smear, Quantiferon Gold test, COVID-19 antibody serology, Fungitell - Continue broad spectrum antimicrobials (Cefepime, Eraxis) and stress dose steroids (Solu-medrol 60 mg q6h) - Will consider neuromuscular blockade protocol and proning   # Shock - Suspect septic shock - Continue broad spectrum antibiotics - Titrate Levophed to maintain MAP > 65   # Mixed Respiratory and Metabolic Acidosis  - In the setting of elevated CO2, as well as lactic acidosis.  - Repeat ABG later this AM shows improvements in pH that coincide with improvements in lactic acid as well as the initiation of bicarb drip  - Plan to continue bicarb drip at this time @ 125 cc/hr - RR on vent increased to continue working on hypercapnia.   # AKI  - Given increasing need for pressor support, high suspicion for ATN.  - Decreasing UOP overnight.  - Strict In/Out  - Avoid nephrotoxic agents - No current indications for emergent dialysis.   # Shock Liver  - Interval development of shock liver over the past 24 hours.  - Will continue to trend LFTs closely  # Pneumothorax with Pneumomediastinum # Subcutaneous Emphysema   - Chest tube placed on 10/18  - Post-procedure CXR pending   # SVT # Bradycardia post Intubation  # Hx of MVR s/p replacement # Hx of HTN  - Cardiology following  - No longer on Dilt or Amiodarone - Continue warfarin per pharmacy dosing  # Hx of RA, SLE - Hold outpt rituximab  Best practice:  Diet: NPO DVT prophylaxis:  coumadin GI prophylaxis: protonix Mobility: bed rest Code Status: FULL  Family Communication: Family updated at bedside.  Disposition: ICU  Dr. Jose Persia Internal Medicine PGY-2  Pager: [phone]  01-02-2020, 11:28 AM

## 2019-12-21 NOTE — Procedures (Signed)
Intubation Procedure Note  FELIX PRATT  [id number]  [birth year]  Date:Dec 26, 2019  Time:12:27 AM   Provider Performing:Yutaka Holberg T Francie Massing    Procedure: Intubation (83507)  Indication(s) Respiratory Failure  Consent Unable to obtain consent due to emergent nature of procedure.   Anesthesia Etomidate and Rocuronium   Time Out Verified patient identification, verified procedure, site/side was marked, verified correct patient position, special equipment/implants available, medications/allergies/relevant history reviewed, required imaging and test results available.   Sterile Technique Usual hand hygeine, masks, and gloves were used   Procedure Description Patient positioned in bed supine.  Sedation given as noted above.  Patient was intubated with endotracheal tube using DL, MAC 3.  View was Grade 1 full glottis .  Number of attempts was 1.  Colorimetric CO2 detector was consistent with tracheal placement.   Complications/Tolerance HR dropped to 50s following intubation, given 81m atropine with improvement Chest X-ray is ordered to verify placement.   EBL Minimal   Specimen(s) None

## 2019-12-21 NOTE — Progress Notes (Addendum)
Walking past patients room. RN indicated concern regarding hypotension.  Chest tube placed for pneumothorax.  Chest tube chamber with 172ml of blood / no acute drainage noted in tubing.  Patient noted to have hypotension into the 57'X systolic, blood in HME / ballard tubing.  Return volumes not equal on vent.  Breath sounds bilaterally with rhonchi.  Patient progressed from hypotensive, bradycardic to PEA arrest.  CPR initiated immediately. Pads placed on patient with back board in place.  ACLS protocol continued until 1452 when ROSC was achieved. Family updated at bedside.  ACLS efforts from 1447 to 1452.  Dr. Chase Caller at bedside.  FOB performed.  STAT labs, PCR ordered. Levophed infusion ongoing. Will monitor.    Noe Gens, MSN, NP-C, AGACNP-BC [facility] 12/27/19, 3:08 PM   Please see [email or web address] for pager details.

## 2019-12-21 NOTE — Progress Notes (Addendum)
Progress Note  Patient Name: [name] Date of Encounter: 2019-12-21  [facility] Cardiologist: Rozann Lesches, MD   Subjective   Pt intubated, sedated  On pressors  Inpatient Medications    Scheduled Meds: . Chlorhexidine Gluconate Cloth  6 each Topical Daily  . fentaNYL (SUBLIMAZE) injection  50 mcg Intravenous Once  . insulin aspart  3-9 Units Subcutaneous Q4H  . levothyroxine  75 mcg Oral QAC breakfast  . methylPREDNISolone (SOLU-MEDROL) injection  60 mg Intravenous Q6H  . multivitamin with minerals  1 tablet Oral Daily  . pantoprazole  40 mg Oral BID AC  . polyethylene glycol  17 g Oral Daily  . potassium chloride  10 mEq Oral Daily  . potassium chloride  40 mEq Oral Once  . rOPINIRole  1 mg Oral QHS  . saccharomyces boulardii  250 mg Oral BID  . sucralfate  1 g Oral TID WC & HS  . Warfarin - Pharmacist Dosing Inpatient   Does not apply q1600   Continuous Infusions: . sodium chloride    . anidulafungin Stopped (12/06/19 1928)  . ceFEPime (MAXIPIME) IV    . fentaNYL infusion INTRAVENOUS    . norepinephrine (LEVOPHED) Adult infusion 5 mcg/min (12-21-2019 0600)  . propofol (DIPRIVAN) infusion 20 mcg/kg/min (21-Dec-2019 0600)  . sodium bicarbonate (isotonic) 150 mEq in D5W 1000 mL infusion 125 mL/hr at 12-21-2019 0600   PRN Meds: Place/Maintain arterial line **AND** sodium chloride, acetaminophen, fentaNYL, levalbuterol, sodium chloride, traMADol   Vital Signs    Vitals:   2019/12/21 0400 December 21, 2019 0500 Dec 21, 2019 0600 12/21/2019 0700  BP:      Pulse: (!) 114  (!) 110   Resp: (!) 35 (!) 35 (!) 35   Temp:    (!) 96.7 F (35.9 C)  TempSrc:    Axillary  SpO2: 92%  (!) 86%   Weight:      Height:        Intake/Output Summary (Last 24 hours) at 2019-12-21 0720 Last data filed at 12-21-2019 0600 Gross per 24 hour  Intake 1258.41 ml  Output 760 ml  Net 498.41 ml   Last 3 Weights 12/06/2019 11/30/2019 11/24/2019  Weight (lbs) 165 lb 2 oz 145 lb 8.1 oz 149 lb 0.5  oz  Weight (kg) 74.9 kg 66 kg 67.6 kg      Telemetry    SR/ST  Occasional PVCs   - Personally Reviewed  ECG    No new  - Personally Reviewed  Physical Exam   GEN: Pt intubated    Neck: Neck is very full  New since Friday  +crepitus  Cardiac: RRR, Distant   Crisp valve sound Respiratory: Mild rhonchi GI: Soft, nontender, non-distended  MS: No edema; 1+ DP   Neuro:  Nonfocal  Psych: Normal affect   Labs    High Sensitivity Troponin:   Recent Labs  Lab 11/09/19 1405 11/09/19 1841 12/10/2019 2337 11/28/19 0400  TROPONINIHS 10 9 11 15       Chemistry Recent Labs  Lab 12/05/19 0414 12/05/19 0414 12/06/19 0529 12/21/19 0008 Dec 21, 2019 0023 2019-12-21 0023 12/21/2019 0150 12-21-2019 0410 12-21-19 0543  NA 133*   < > 132*   < > 136   < > 133* 135 133*  K 3.9   < > 4.1   < > 3.7   < > 3.8 3.9 3.5  CL 96*   < > 96*  --  94*  --   --  93*  --   CO2 30   < >  28  --  21*  --   --  25  --   GLUCOSE 117*   < > 137*  --  348*  --   --  316*  --   BUN 19   < > 17  --  22*  --   --  25*  --   CREATININE 0.49   < > 0.48  --  1.32*  --   --  1.58*  --   CALCIUM 8.2*   < > 8.1*  --  7.1*  --   --  7.3*  --   PROT 5.2*  --  5.1*  --   --   --   --  5.2*  --   ALBUMIN 2.1*  --  2.2*  --   --   --   --  2.0*  --   AST 26  --  51*  --   --   --   --  592*  --   ALT 58*  --  87*  --   --   --   --  456*  --   ALKPHOS 85  --  104  --   --   --   --  160*  --   BILITOT 0.5  --  0.4  --   --   --   --  2.0*  --   GFRNONAA >60   < > >60  --  47*  --   --  37*  --   ANIONGAP 7   < > 8  --  21*  --   --  17*  --    < > = values in this interval not displayed.     Hematology Recent Labs  Lab 12/05/19 0414 12/05/19 0414 12/06/19 0529 Dec 10, 2019 0008 December 10, 2019 0150 12-10-2019 0410 12/10/2019 0543  WBC 13.1*  --  21.2*  --   --  47.9*  --   RBC 2.99*  --  3.02*  --   --  3.32*  --   HGB 9.1*   < > 9.1*   < > 10.5* 10.2* 10.2*  HCT 27.9*   < > 29.0*   < > 31.0* 32.2* 30.0*  MCV 93.3  --  96.0   --   --  97.0  --   MCH 30.4  --  30.1  --   --  30.7  --   MCHC 32.6  --  31.4  --   --  31.7  --   RDW 14.6  --  14.6  --   --  14.8  --   PLT 172  --  153  --   --  107*  --    < > = values in this interval not displayed.    BNPNo results for input(s): BNP, PROBNP in the last 168 hours.   DDimer  Recent Labs  Lab 12/04/19 0443  DDIMER 4.77*     Radiology    DG CHEST PORT 1 VIEW  Result Date: 10-Dec-2019 CLINICAL DATA:  Subcutaneous emphysema EXAM: PORTABLE CHEST 1 VIEW COMPARISON:  12/06/2019 FINDINGS: Shallow inspiration. Endotracheal tube and left central venous catheter are unchanged in position. Increasing subcutaneous emphysema in the neck and right chest wall with suggestion of pneumomediastinum. No obvious pneumothorax. Bilateral perihilar and basilar infiltrates. No pleural effusions. IMPRESSION: Increasing subcutaneous emphysema in the neck and right chest wall with suggestion of developing pneumomediastinum. No obvious pneumothorax. Changes are nonspecific but likely  indicate barotrauma. Bilateral perihilar and basilar infiltrates. Electronically Signed   By: Lucienne Capers M.D.   On: Dec 21, 2019 06:36   DG CHEST PORT 1 VIEW  Result Date: 12/06/2019 CLINICAL DATA:  Intubation EXAM: PORTABLE CHEST 1 VIEW COMPARISON:  12/06/2019 FINDINGS: Endotracheal to is 3 cm above the carina. Left central line is in place with the tip in the SVC, unchanged. Diffuse bilateral airspace disease again noted, unchanged. Heart is normal size. IMPRESSION: Endotracheal tube 3 cm above the carina. Diffuse bilateral airspace disease, unchanged. Electronically Signed   By: Rolm Baptise M.D.   On: 12/06/2019 23:36   DG Chest Port 1 View  Result Date: 12/06/2019 CLINICAL DATA:  Acute respiratory distress EXAM: PORTABLE CHEST 1 VIEW COMPARISON:  12/05/2019 FINDINGS: Left IJ central venous catheter tip projects over the lower SVC. Faint wires suggesting epicardial pacer leads project over the right  heart. Mildly worsened bilateral interstitial and airspace edema, most confluent in the left mid lung and left lung base (taking into account the left unilateral breast implant which does cause increased density over the left chest). Heart size within normal limits. IMPRESSION: 1. Mildly worsened bilateral interstitial and airspace edema, most confluent in the left mid lung and left lung base. Electronically Signed   By: Van Clines M.D.   On: 12/06/2019 15:28   ECHOCARDIOGRAM COMPLETE  Result Date: 12/05/2019    ECHOCARDIOGRAM REPORT   Patient Name:   [name] Date of Exam: 12/05/2019 Medical Rec #:  [id number]       Height:       64.0 in Accession #:    [id number]      Weight:       145.5 lb Date of Birth:  10/16/1970        BSA:          1.709 m Patient Age:    30 years        BP:           101/64 mmHg Patient Gender: F               HR:           70 bpm. Exam Location:  [facility] Procedure: 2D Echo Indications:    Abnormal ECG 794.31 / R94.31  History:        Patient has prior history of Echocardiogram examinations, most                 recent 08/20/2011. Pneumonia due to COVID-19 virus, History of                 breast cancer, SVT , Acute respiratory failure with hypoxia ,                 Mitral Valve Replacement.  Sonographer:    Leavy Cella RDCS (AE) Referring Phys: [id number] VINEET SOOD  Sonographer Comments: Image acquisition challenging due to breast implants and restricted mobility. IMPRESSIONS  1. Difficult to assess wall motion due to poor visualization. Left ventricular ejection fraction, by estimation, is 55 to 60%. The left ventricle has normal function. The left ventricle has no regional wall motion abnormalities. There is mild concentric  left ventricular hypertrophy. diastolic function is indeterminant due to mechanical mitral valve.  2. Right ventricular systolic function is mildly reduced. The right ventricular size is normal.  3. A St. Jude mechanical mitral valve prosthesis is  partially visualized. Appears well seated based on limted views. Mean gradient 14mmHg at HR 67.  No evidence of mitral valve regurgitation.  4. The aortic valve is tricuspid. Aortic valve regurgitation is trivial.  5. The inferior vena cava is normal in size with greater than 50% respiratory variability, suggesting right atrial pressure of 3 mmHg. Comparison(s): No significant change from prior study. FINDINGS  Left Ventricle: Difficult to assess wall motion due to poor visualization. Left ventricular ejection fraction, by estimation, is 55 to 60%. The left ventricle has normal function. The left ventricle has no regional wall motion abnormalities. The left ventricular internal cavity size was normal in size. There is mild concentric left ventricular hypertrophy. Diastolic function is indeterminant due to mechanical mitral valve. Right Ventricle: The right ventricular size is normal. Right vetricular wall thickness was not well visualized. Right ventricular systolic function is mildly reduced. Left Atrium: Left atrial size was not well visualized. Right Atrium: Right atrial size was normal in size. Pericardium: There is no evidence of pericardial effusion. Mitral Valve: A St. Jude mechanical mitral valve prosthesis is partially visualized. Appears well seated based on limted view. Mean gradient 48mmHg at HR 67. No evidence of mitral valve regurgitation. MV peak gradient, 6.2 mmHg. Tricuspid Valve: The tricuspid valve is normal in structure. Tricuspid valve regurgitation is mild. Aortic Valve: The aortic valve is tricuspid. Aortic valve regurgitation is trivial. Pulmonic Valve: The pulmonic valve was grossly normal. Pulmonic valve regurgitation is trivial. Aorta: The aortic root is normal in size and structure. Venous: The inferior vena cava is normal in size with greater than 50% respiratory variability, suggesting right atrial pressure of 3 mmHg. IAS/Shunts: The atrial septum is grossly normal.  LEFT VENTRICLE PLAX 2D  LVIDd:         3.25 cm  Diastology LVIDs:         2.33 cm  LV e' medial:    4.79 cm/s LV PW:         1.21 cm  LV E/e' medial:  16.7 LV IVS:        1.25 cm  LV e' lateral:   6.64 cm/s LVOT diam:     2.00 cm  LV E/e' lateral: 12.1 LVOT Area:     3.14 cm  RIGHT VENTRICLE TAPSE (M-mode): 1.6 cm LEFT ATRIUM           Index       RIGHT ATRIUM          Index LA diam:      3.70 cm 2.17 cm/m  RA Area:     9.38 cm LA Vol (A2C): 32.7 ml 19.13 ml/m RA Volume:   18.40 ml 10.77 ml/m LA Vol (A4C): 59.3 ml 34.70 ml/m   AORTA Ao Root diam: 3.50 cm MITRAL VALVE               TRICUSPID VALVE MV Area (PHT): 3.68 cm    TR Peak grad:   39.9 mmHg MV Peak grad:  6.2 mmHg    TR Vmax:        316.00 cm/s MV Mean grad:  2.0 mmHg MV Vmax:       1.25 m/s    SHUNTS MV Vmean:      68.6 cm/s   Systemic Diam: 2.00 cm MV Decel Time: 206 msec MV E velocity: 80.10 cm/s MV A velocity: 69.40 cm/s MV E/A ratio:  1.15 Gwyndolyn Kaufman MD Electronically signed by Gwyndolyn Kaufman MD Signature Date/Time: 12/05/2019/5:35:34 PM    Final     Cardiac Studies   Echo 10/16  1. Difficult to assess  wall motion due to poor visualization. Left ventricular ejection fraction, by estimation, is 55 to 60%. The left ventricle has normal function. The left ventricle has no regional wall motion abnormalities. There is mild concentric left ventricular hypertrophy. diastolic function is indeterminant due to mechanical mitral valve. 2. Right ventricular systolic function is mildly reduced. The right ventricular size is normal. 3. A St. Jude mechanical mitral valve prosthesis is partially visualized. Appears well seated based on limted views. Mean gradient 36mmHg at HR 67. No evidence of mitral valve regurgitation. 4. The aortic valve is tricuspid. Aortic valve regurgitation is trivial. 5. The inferior vena cava is normal in size with greater than 50% respiratory variability, suggesting right atrial pressure of 3 mmHg. Comparison(s): No significant  change from prior study.  Patient Profile     49 y.o. 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) who is now post COVID with severe pulmonary involvement  Asked to see on 10/15 fro recurrent spells of SVT  Assessment & Plan    SVT   I saw the pt on Friday, prior to transfer from [facility]   She had many episodes of sustained SVT (rates over 200) that required several treatments with adenosine (12 mg effective)  Helped as she was started IV diltiazem and digoxin She his not having any recurrence now   Off dilt because she is requiring pressors    2  Hx MV dz   S/p MVR with St Jude mechanical MVR   Echo noted above  Mean gradient is low  Continue anticoagulation     3  Pulmonary  Pt is post COVID   Now intubated on 100%     For questions or updates, please contact [facility] Please consult [email or web address] for contact info under        Signed, Dorris Carnes, MD  2019/12/23, 7:20 AM

## 2019-12-21 NOTE — Progress Notes (Signed)
   12-18-19 1447  Clinical Encounter Type  Visited With Family  Visit Type Code  Referral From Nurse  Consult/Referral To Chaplain  Chaplain responded to the code. Chaplain provided comfort and offered prayer to the patient's sister. Patient's daughter and friend also present. Chaplain was with the patient's sister during the five events. This note was prepared by Jeanine Luz, M.Div..  For questions please contact by phone [phone].

## 2019-12-21 NOTE — Progress Notes (Signed)
Order from Dr. Gillermina Phy for [place] placement. Pt is restricted on left and had to have multiple attempts at ABG. Upon talking to Dr. Francie Massing about [place]ella [place], she states with her history of raynaud's it is preferred not to place radial aline, therefore she will place femoral aline.

## 2019-12-21 NOTE — Progress Notes (Signed)
ANTICOAGULATION CONSULT NOTE -   Pharmacy Consult for:  warfarin dosing Indication:  Mechanical valve  Vital Signs:  Temp: 96.2 F (35.7 C) (10/18 1100) Temp Source: Axillary (10/18 1100) BP: 109/58 (10/18 0845) Pulse Rate: 96 (10/18 0845)  Labs: Recent Labs    12/05/19 0414 12/05/19 0414 12/05/19 0545 12/06/19 0529 01/04/2020 0008 2020-01-04 0023 2020/01/04 0150 04-Jan-2020 0410 2020/01/04 0410 01/04/2020 0543 01/04/2020 1143  HGB 9.1*   < >  --  9.1*   < >  --    < > 10.2*   < > 10.2* 8.5*  HCT 27.9*   < >  --  29.0*   < >  --    < > 32.2*  --  30.0* 25.0*  PLT 172  --   --  153  --   --   --  107*  --   --   --   LABPROT  --   --  25.6* 23.4*  --   --   --  32.0*  --   --   --   INR  --   --  2.4* 2.2*  --   --   --  3.2*  --   --   --   CREATININE 0.49   < >  --  0.48  --  1.32*  --  1.58*  --   --   --    < > = values in this interval not displayed.    Estimated Creatinine Clearance: 41.8 mL/min (A) (by C-G formula based on SCr of 1.58 mg/dL (H)).    Assessment: Pharmacy consulted to dose warfarin  for this 49 yo female on chronic anti-coagulation therapy with warfarin for history of mechanical valve.   The patient's INR today is therapeutic however did have a large rise (INR 3.2 << 2.2, goal of 2.5-3.5). The patient's LFTs also noted to rise from 51/87 to 592/456. Due to both of these - will hold the warfarin dose this evening.   Goal of Therapy:  INR Goal: 2.5-3.5 Monitor platelets by anticoagulation protocol: Yes   Plan:  - Hold Warfarin dose today - Daily PT/INR, CBC q72h - Will continue to monitor for any signs/symptoms of bleeding and will follow up with PT/INR in the a.m.    Thank you for allowing pharmacy to be a part of this patient's care.  Alycia Rossetti, PharmD, BCPS Clinical Pharmacist Clinical phone for January 04, 2020: [phone] 01-04-20 1:23 PM   **Pharmacist phone directory can now be found on [email or web address] ([email or web address]).  Listed under [place].

## 2019-12-21 NOTE — Progress Notes (Signed)
eLink Physician-Brief Progress Note Patient Name: AALAYSIA LIGGINS DOB: December 22, 1970 MRN: [id number]   Date of Service  12-29-19  HPI/Events of Note  ABG 7.23/70/70/29. Lactate improved from > 11 to 7.0.  RN reports development of significant left neck subcutaneous emphysema.   eICU Interventions  Continue current ventilator settings.  CXR to evaluate for PTX vs pneumomediastinum.     Intervention Category Major Interventions: Acid-Base disturbance - evaluation and management;Other:  Charlott Rakes 12/29/2019, 6:12 AM

## 2019-12-21 NOTE — Progress Notes (Signed)
Cardiac time of death 64. MD aware. Verified by Mayford Knife, RN & Loma Newton, RN. Absent heart sounds. No breath sounds heard bilaterally. Family comforted at bedside.

## 2019-12-21 NOTE — Progress Notes (Signed)
RT note-Code blue called, CPR started and patient removed from ventilator and ventilated with ambu bag 100% with PEEP valve in place. ROSC was obtained and patient placed back on ventilator. Dr. Chase Caller then bronched patient due to copious frank bloody secretions. Remains on current settings with fio2 increased to 100%. Continue to monitor.

## 2019-12-21 NOTE — Progress Notes (Signed)
CRITICAL VALUE ALERT  Critical Value:  Lactic and pCO2   Date & Time Notied:  10/18 0603  Provider Notified: E-link RN   Orders Received/Actions taken: RN will make E-link MD aware.

## 2019-12-21 DEATH — deceased

## 2019-12-24 ENCOUNTER — Other Ambulatory Visit (HOSPITAL_COMMUNITY): Payer: BC Managed Care – PPO

## 2019-12-31 ENCOUNTER — Ambulatory Visit (HOSPITAL_COMMUNITY): Payer: BC Managed Care – PPO | Admitting: Oncology

## 2020-01-18 ENCOUNTER — Ambulatory Visit: Payer: Medicare Other | Admitting: Cardiology

## 2020-01-18 ENCOUNTER — Ambulatory Visit: Payer: Medicare Other | Admitting: "Endocrinology

## 2020-01-22 ENCOUNTER — Ambulatory Visit: Payer: BC Managed Care – PPO | Admitting: Gastroenterology
